# Patient Record
Sex: Male | Born: 1960 | Race: Black or African American | Hispanic: No | Marital: Single | Smoking: Former smoker
Health system: Southern US, Community
[De-identification: ages and names within clinical notes are randomized; demographics above are authoritative.]

## PROBLEM LIST (undated history)

## (undated) ENCOUNTER — Emergency Department (HOSPITAL_COMMUNITY): Discharge status: Absent without leave | Payer: Self-pay

## (undated) DIAGNOSIS — M1990 Unspecified osteoarthritis, unspecified site: Secondary | ICD-10-CM

## (undated) DIAGNOSIS — I10 Essential (primary) hypertension: Secondary | ICD-10-CM

## (undated) DIAGNOSIS — F1020 Alcohol dependence, uncomplicated: Secondary | ICD-10-CM

## (undated) DIAGNOSIS — F1010 Alcohol abuse, uncomplicated: Secondary | ICD-10-CM

## (undated) DIAGNOSIS — F419 Anxiety disorder, unspecified: Secondary | ICD-10-CM

## (undated) DIAGNOSIS — F329 Major depressive disorder, single episode, unspecified: Secondary | ICD-10-CM

## (undated) DIAGNOSIS — E119 Type 2 diabetes mellitus without complications: Secondary | ICD-10-CM

## (undated) DIAGNOSIS — G8929 Other chronic pain: Secondary | ICD-10-CM

## (undated) DIAGNOSIS — K859 Acute pancreatitis without necrosis or infection, unspecified: Secondary | ICD-10-CM

## (undated) DIAGNOSIS — IMO0001 Reserved for inherently not codable concepts without codable children: Secondary | ICD-10-CM

## (undated) DIAGNOSIS — R109 Unspecified abdominal pain: Secondary | ICD-10-CM

## (undated) DIAGNOSIS — E1142 Type 2 diabetes mellitus with diabetic polyneuropathy: Secondary | ICD-10-CM

## (undated) DIAGNOSIS — R51 Headache: Secondary | ICD-10-CM

## (undated) DIAGNOSIS — F32A Depression, unspecified: Secondary | ICD-10-CM

## (undated) DIAGNOSIS — E7800 Pure hypercholesterolemia, unspecified: Secondary | ICD-10-CM

## (undated) DIAGNOSIS — G709 Myoneural disorder, unspecified: Secondary | ICD-10-CM

## (undated) HISTORY — DX: Myoneural disorder, unspecified: G70.9

---
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
## 2005-07-29 ENCOUNTER — Ambulatory Visit: Payer: Self-pay | Admitting: Internal Medicine

## 2005-08-16 ENCOUNTER — Ambulatory Visit: Payer: Self-pay | Admitting: Internal Medicine

## 2005-08-30 ENCOUNTER — Ambulatory Visit: Payer: Self-pay | Admitting: Internal Medicine

## 2005-08-30 DIAGNOSIS — Z862 Personal history of diseases of the blood and blood-forming organs and certain disorders involving the immune mechanism: Secondary | ICD-10-CM

## 2005-08-30 DIAGNOSIS — Z8639 Personal history of other endocrine, nutritional and metabolic disease: Secondary | ICD-10-CM

## 2005-09-20 ENCOUNTER — Encounter (INDEPENDENT_AMBULATORY_CARE_PROVIDER_SITE_OTHER): Payer: Self-pay | Admitting: Internal Medicine

## 2006-08-14 DIAGNOSIS — E119 Type 2 diabetes mellitus without complications: Secondary | ICD-10-CM | POA: Insufficient documentation

## 2006-09-22 ENCOUNTER — Emergency Department (HOSPITAL_COMMUNITY): Admission: EM | Admit: 2006-09-22 | Discharge: 2006-09-23 | Payer: Self-pay | Admitting: Emergency Medicine

## 2006-09-27 ENCOUNTER — Encounter (INDEPENDENT_AMBULATORY_CARE_PROVIDER_SITE_OTHER): Payer: Self-pay | Admitting: Internal Medicine

## 2006-09-29 ENCOUNTER — Ambulatory Visit: Payer: Self-pay | Admitting: Internal Medicine

## 2006-09-29 LAB — CONVERTED CEMR LAB
ALT: 32 units/L (ref 0–53)
Albumin: 4.5 g/dL (ref 3.5–5.2)
Alkaline Phosphatase: 96 units/L (ref 39–117)
Amylase: 39 units/L (ref 0–105)
C-Peptide: 1.4 ng/mL (ref 0.80–3.90)
CO2: 23 meq/L (ref 19–32)
Calcium: 9.9 mg/dL (ref 8.4–10.5)
Chloride: 92 meq/L — ABNORMAL LOW (ref 96–112)
Cholesterol: 358 mg/dL — ABNORMAL HIGH (ref 0–200)
Creatinine, Ser: 1.05 mg/dL (ref 0.40–1.50)
HDL: 31 mg/dL — ABNORMAL LOW (ref >39)
Potassium: 4 meq/L (ref 3.5–5.3)
Total Bilirubin: 0.5 mg/dL (ref 0.3–1.2)
Total CHOL/HDL Ratio: 11.5
Triglycerides: 847 mg/dL — ABNORMAL HIGH (ref <150)

## 2006-09-30 ENCOUNTER — Encounter (INDEPENDENT_AMBULATORY_CARE_PROVIDER_SITE_OTHER): Payer: Self-pay | Admitting: Internal Medicine

## 2006-09-30 DIAGNOSIS — F1010 Alcohol abuse, uncomplicated: Secondary | ICD-10-CM | POA: Insufficient documentation

## 2006-09-30 DIAGNOSIS — R569 Unspecified convulsions: Secondary | ICD-10-CM | POA: Insufficient documentation

## 2006-09-30 DIAGNOSIS — I10 Essential (primary) hypertension: Secondary | ICD-10-CM

## 2006-09-30 DIAGNOSIS — E782 Mixed hyperlipidemia: Secondary | ICD-10-CM

## 2006-10-03 ENCOUNTER — Ambulatory Visit: Payer: Self-pay | Admitting: Internal Medicine

## 2006-10-03 LAB — CONVERTED CEMR LAB: Microalb, Ur: 0.24 mg/dL (ref 0.00–1.89)

## 2006-10-04 ENCOUNTER — Ambulatory Visit: Payer: Self-pay | Admitting: *Deleted

## 2006-10-17 ENCOUNTER — Ambulatory Visit: Payer: Self-pay | Admitting: Internal Medicine

## 2006-10-17 DIAGNOSIS — K5900 Constipation, unspecified: Secondary | ICD-10-CM | POA: Insufficient documentation

## 2006-10-20 ENCOUNTER — Ambulatory Visit (HOSPITAL_COMMUNITY): Admission: RE | Admit: 2006-10-20 | Discharge: 2006-10-20 | Payer: Self-pay | Admitting: Internal Medicine

## 2006-10-25 ENCOUNTER — Encounter (INDEPENDENT_AMBULATORY_CARE_PROVIDER_SITE_OTHER): Payer: Self-pay | Admitting: *Deleted

## 2006-10-31 ENCOUNTER — Ambulatory Visit: Payer: Self-pay | Admitting: Internal Medicine

## 2006-10-31 LAB — CONVERTED CEMR LAB: Blood Glucose, Fingerstick: 251

## 2006-12-05 ENCOUNTER — Telehealth (INDEPENDENT_AMBULATORY_CARE_PROVIDER_SITE_OTHER): Payer: Self-pay | Admitting: Internal Medicine

## 2006-12-12 ENCOUNTER — Ambulatory Visit: Payer: Self-pay | Admitting: Internal Medicine

## 2007-01-12 ENCOUNTER — Ambulatory Visit: Payer: Self-pay | Admitting: Internal Medicine

## 2007-01-12 DIAGNOSIS — B9789 Other viral agents as the cause of diseases classified elsewhere: Secondary | ICD-10-CM

## 2007-01-12 LAB — CONVERTED CEMR LAB: Blood Glucose, Fingerstick: 434

## 2007-01-16 ENCOUNTER — Ambulatory Visit: Payer: Self-pay | Admitting: Internal Medicine

## 2007-01-23 ENCOUNTER — Ambulatory Visit: Payer: Self-pay | Admitting: Internal Medicine

## 2007-01-26 ENCOUNTER — Ambulatory Visit: Payer: Self-pay | Admitting: Internal Medicine

## 2007-02-20 ENCOUNTER — Ambulatory Visit: Payer: Self-pay | Admitting: Internal Medicine

## 2007-02-20 LAB — CONVERTED CEMR LAB
Blood Glucose, Fingerstick: 600
Blood in Urine, dipstick: NEGATIVE
Protein, U semiquant: NEGATIVE
Urobilinogen, UA: NEGATIVE

## 2007-02-21 ENCOUNTER — Encounter (INDEPENDENT_AMBULATORY_CARE_PROVIDER_SITE_OTHER): Payer: Self-pay | Admitting: Internal Medicine

## 2007-02-23 LAB — CONVERTED CEMR LAB
ALT: 26 units/L (ref 0–53)
Albumin: 4.7 g/dL (ref 3.5–5.2)
Alkaline Phosphatase: 111 units/L (ref 39–117)
BUN: 10 mg/dL (ref 6–23)
Chloride: 96 meq/L (ref 96–112)
Creatinine, Ser: 1.08 mg/dL (ref 0.40–1.50)
Eosinophils Relative: 1 % (ref 0–5)
Glucose, Bld: 422 mg/dL — ABNORMAL HIGH (ref 70–99)
HCT: 44.5 % (ref 39.0–52.0)
Monocytes Relative: 7 % (ref 3–12)
Neutrophils Relative %: 53 % (ref 43–77)
Platelets: 236 10*3/uL (ref 150–400)
Potassium: 4.3 meq/L (ref 3.5–5.3)
RDW: 12.8 % (ref 11.5–15.5)
Sodium: 135 meq/L (ref 135–145)
Total Protein: 9.1 g/dL — ABNORMAL HIGH (ref 6.0–8.3)
WBC: 6.3 10*3/uL (ref 4.0–10.5)

## 2007-03-06 ENCOUNTER — Ambulatory Visit: Payer: Self-pay | Admitting: Internal Medicine

## 2007-03-12 ENCOUNTER — Telehealth (INDEPENDENT_AMBULATORY_CARE_PROVIDER_SITE_OTHER): Payer: Self-pay | Admitting: Internal Medicine

## 2007-03-13 ENCOUNTER — Ambulatory Visit: Payer: Self-pay | Admitting: Internal Medicine

## 2007-03-27 ENCOUNTER — Telehealth (INDEPENDENT_AMBULATORY_CARE_PROVIDER_SITE_OTHER): Payer: Self-pay | Admitting: Internal Medicine

## 2007-03-30 ENCOUNTER — Ambulatory Visit: Payer: Self-pay | Admitting: Internal Medicine

## 2007-04-24 ENCOUNTER — Telehealth (INDEPENDENT_AMBULATORY_CARE_PROVIDER_SITE_OTHER): Payer: Self-pay | Admitting: Internal Medicine

## 2007-04-26 ENCOUNTER — Telehealth (INDEPENDENT_AMBULATORY_CARE_PROVIDER_SITE_OTHER): Payer: Self-pay | Admitting: Internal Medicine

## 2007-06-01 ENCOUNTER — Ambulatory Visit: Payer: Self-pay | Admitting: Internal Medicine

## 2007-06-27 ENCOUNTER — Telehealth (INDEPENDENT_AMBULATORY_CARE_PROVIDER_SITE_OTHER): Payer: Self-pay | Admitting: Internal Medicine

## 2007-07-26 ENCOUNTER — Telehealth (INDEPENDENT_AMBULATORY_CARE_PROVIDER_SITE_OTHER): Payer: Self-pay | Admitting: Internal Medicine

## 2007-08-27 ENCOUNTER — Telehealth (INDEPENDENT_AMBULATORY_CARE_PROVIDER_SITE_OTHER): Payer: Self-pay | Admitting: Internal Medicine

## 2007-09-27 ENCOUNTER — Telehealth (INDEPENDENT_AMBULATORY_CARE_PROVIDER_SITE_OTHER): Payer: Self-pay | Admitting: Internal Medicine

## 2007-10-11 ENCOUNTER — Ambulatory Visit: Payer: Self-pay | Admitting: Internal Medicine

## 2007-10-11 DIAGNOSIS — G4700 Insomnia, unspecified: Secondary | ICD-10-CM

## 2007-10-29 ENCOUNTER — Telehealth (INDEPENDENT_AMBULATORY_CARE_PROVIDER_SITE_OTHER): Payer: Self-pay | Admitting: Internal Medicine

## 2007-11-01 ENCOUNTER — Telehealth (INDEPENDENT_AMBULATORY_CARE_PROVIDER_SITE_OTHER): Payer: Self-pay | Admitting: Internal Medicine

## 2007-11-21 ENCOUNTER — Telehealth (INDEPENDENT_AMBULATORY_CARE_PROVIDER_SITE_OTHER): Payer: Self-pay | Admitting: Internal Medicine

## 2007-11-28 ENCOUNTER — Telehealth (INDEPENDENT_AMBULATORY_CARE_PROVIDER_SITE_OTHER): Payer: Self-pay | Admitting: Internal Medicine

## 2007-12-26 ENCOUNTER — Telehealth (INDEPENDENT_AMBULATORY_CARE_PROVIDER_SITE_OTHER): Payer: Self-pay | Admitting: Internal Medicine

## 2008-01-24 ENCOUNTER — Telehealth (INDEPENDENT_AMBULATORY_CARE_PROVIDER_SITE_OTHER): Payer: Self-pay | Admitting: *Deleted

## 2008-01-25 ENCOUNTER — Telehealth (INDEPENDENT_AMBULATORY_CARE_PROVIDER_SITE_OTHER): Payer: Self-pay | Admitting: Internal Medicine

## 2008-02-25 ENCOUNTER — Telehealth (INDEPENDENT_AMBULATORY_CARE_PROVIDER_SITE_OTHER): Payer: Self-pay | Admitting: Internal Medicine

## 2008-03-26 ENCOUNTER — Telehealth (INDEPENDENT_AMBULATORY_CARE_PROVIDER_SITE_OTHER): Payer: Self-pay | Admitting: Internal Medicine

## 2008-04-11 ENCOUNTER — Ambulatory Visit: Payer: Self-pay | Admitting: Internal Medicine

## 2008-04-11 LAB — CONVERTED CEMR LAB: Blood Glucose, Fingerstick: 338

## 2008-04-23 ENCOUNTER — Telehealth (INDEPENDENT_AMBULATORY_CARE_PROVIDER_SITE_OTHER): Payer: Self-pay | Admitting: Internal Medicine

## 2008-05-19 ENCOUNTER — Telehealth (INDEPENDENT_AMBULATORY_CARE_PROVIDER_SITE_OTHER): Payer: Self-pay | Admitting: *Deleted

## 2008-05-27 ENCOUNTER — Ambulatory Visit: Payer: Self-pay | Admitting: Internal Medicine

## 2008-06-23 ENCOUNTER — Telehealth (INDEPENDENT_AMBULATORY_CARE_PROVIDER_SITE_OTHER): Payer: Self-pay | Admitting: Internal Medicine

## 2008-07-10 ENCOUNTER — Encounter (INDEPENDENT_AMBULATORY_CARE_PROVIDER_SITE_OTHER): Payer: Self-pay | Admitting: Internal Medicine

## 2008-07-23 ENCOUNTER — Telehealth (INDEPENDENT_AMBULATORY_CARE_PROVIDER_SITE_OTHER): Payer: Self-pay | Admitting: Internal Medicine

## 2008-08-21 ENCOUNTER — Telehealth (INDEPENDENT_AMBULATORY_CARE_PROVIDER_SITE_OTHER): Payer: Self-pay | Admitting: Internal Medicine

## 2008-09-09 ENCOUNTER — Ambulatory Visit: Payer: Self-pay | Admitting: Internal Medicine

## 2008-09-23 ENCOUNTER — Ambulatory Visit: Payer: Self-pay | Admitting: Internal Medicine

## 2008-09-23 DIAGNOSIS — N401 Enlarged prostate with lower urinary tract symptoms: Secondary | ICD-10-CM

## 2008-10-14 ENCOUNTER — Encounter (INDEPENDENT_AMBULATORY_CARE_PROVIDER_SITE_OTHER): Payer: Self-pay | Admitting: Internal Medicine

## 2008-10-22 ENCOUNTER — Telehealth (INDEPENDENT_AMBULATORY_CARE_PROVIDER_SITE_OTHER): Payer: Self-pay | Admitting: Internal Medicine

## 2008-11-24 ENCOUNTER — Telehealth (INDEPENDENT_AMBULATORY_CARE_PROVIDER_SITE_OTHER): Payer: Self-pay | Admitting: Internal Medicine

## 2008-12-26 ENCOUNTER — Telehealth (INDEPENDENT_AMBULATORY_CARE_PROVIDER_SITE_OTHER): Payer: Self-pay | Admitting: Internal Medicine

## 2009-01-07 ENCOUNTER — Inpatient Hospital Stay (HOSPITAL_COMMUNITY): Admission: EM | Admit: 2009-01-07 | Discharge: 2009-01-10 | Payer: Self-pay | Admitting: Emergency Medicine

## 2009-01-08 ENCOUNTER — Encounter (INDEPENDENT_AMBULATORY_CARE_PROVIDER_SITE_OTHER): Payer: Self-pay | Admitting: Internal Medicine

## 2009-01-27 ENCOUNTER — Ambulatory Visit: Payer: Self-pay | Admitting: Internal Medicine

## 2009-01-27 LAB — CONVERTED CEMR LAB
Blood Glucose, Fingerstick: 267
Hgb A1c MFr Bld: 14 %

## 2009-01-28 ENCOUNTER — Encounter (INDEPENDENT_AMBULATORY_CARE_PROVIDER_SITE_OTHER): Payer: Self-pay | Admitting: Internal Medicine

## 2009-01-28 LAB — CONVERTED CEMR LAB
BUN: 8 mg/dL (ref 6–23)
CO2: 27 meq/L (ref 19–32)
Calcium: 10.1 mg/dL (ref 8.4–10.5)
Chloride: 101 meq/L (ref 96–112)
Creatinine, Ser: 0.74 mg/dL (ref 0.40–1.50)
Glucose, Bld: 250 mg/dL — ABNORMAL HIGH (ref 70–99)
Potassium: 4.4 meq/L (ref 3.5–5.3)

## 2009-02-27 ENCOUNTER — Ambulatory Visit: Payer: Self-pay | Admitting: Internal Medicine

## 2009-02-27 LAB — CONVERTED CEMR LAB
Blood Glucose, Fingerstick: 497
Hgb A1c MFr Bld: >14 %

## 2009-03-02 ENCOUNTER — Emergency Department (HOSPITAL_COMMUNITY): Admission: EM | Admit: 2009-03-02 | Discharge: 2009-03-03 | Payer: Self-pay | Admitting: Emergency Medicine

## 2009-04-01 ENCOUNTER — Telehealth (INDEPENDENT_AMBULATORY_CARE_PROVIDER_SITE_OTHER): Payer: Self-pay | Admitting: Internal Medicine

## 2009-04-02 ENCOUNTER — Emergency Department (HOSPITAL_COMMUNITY): Admission: EM | Admit: 2009-04-02 | Discharge: 2009-04-02 | Payer: Self-pay | Admitting: Emergency Medicine

## 2009-04-06 ENCOUNTER — Ambulatory Visit: Payer: Self-pay | Admitting: Internal Medicine

## 2009-04-11 LAB — CONVERTED CEMR LAB: Phenytoin Lvl: <0.5 ug/mL — ABNORMAL LOW (ref 10.0–20.0)

## 2009-04-12 ENCOUNTER — Inpatient Hospital Stay (HOSPITAL_COMMUNITY): Admission: EM | Admit: 2009-04-12 | Discharge: 2009-04-14 | Payer: Self-pay | Admitting: Emergency Medicine

## 2009-04-15 ENCOUNTER — Emergency Department (HOSPITAL_COMMUNITY): Admission: EM | Admit: 2009-04-15 | Discharge: 2009-04-16 | Payer: Self-pay | Admitting: Emergency Medicine

## 2009-04-16 ENCOUNTER — Encounter (INDEPENDENT_AMBULATORY_CARE_PROVIDER_SITE_OTHER): Payer: Self-pay | Admitting: *Deleted

## 2009-04-28 ENCOUNTER — Emergency Department (HOSPITAL_COMMUNITY): Admission: EM | Admit: 2009-04-28 | Discharge: 2009-04-28 | Payer: Self-pay | Admitting: Emergency Medicine

## 2009-04-30 ENCOUNTER — Ambulatory Visit: Payer: Self-pay | Admitting: Internal Medicine

## 2009-04-30 DIAGNOSIS — F329 Major depressive disorder, single episode, unspecified: Secondary | ICD-10-CM

## 2009-04-30 LAB — CONVERTED CEMR LAB: Blood Glucose, Fingerstick: 300

## 2009-05-10 ENCOUNTER — Emergency Department (HOSPITAL_COMMUNITY): Admission: EM | Admit: 2009-05-10 | Discharge: 2009-05-10 | Payer: Self-pay | Admitting: Emergency Medicine

## 2009-05-12 ENCOUNTER — Ambulatory Visit: Payer: Self-pay | Admitting: Internal Medicine

## 2009-05-14 ENCOUNTER — Emergency Department (HOSPITAL_COMMUNITY): Admission: EM | Admit: 2009-05-14 | Discharge: 2009-05-14 | Payer: Self-pay | Admitting: Emergency Medicine

## 2009-05-27 ENCOUNTER — Telehealth: Payer: Self-pay | Admitting: Physician Assistant

## 2009-06-13 ENCOUNTER — Emergency Department (HOSPITAL_COMMUNITY): Admission: EM | Admit: 2009-06-13 | Discharge: 2009-06-14 | Payer: Self-pay | Admitting: Emergency Medicine

## 2009-06-16 ENCOUNTER — Emergency Department (HOSPITAL_COMMUNITY): Admission: EM | Admit: 2009-06-16 | Discharge: 2009-06-16 | Payer: Self-pay | Admitting: Emergency Medicine

## 2009-06-23 ENCOUNTER — Encounter (INDEPENDENT_AMBULATORY_CARE_PROVIDER_SITE_OTHER): Payer: Self-pay | Admitting: Internal Medicine

## 2009-06-23 ENCOUNTER — Inpatient Hospital Stay (HOSPITAL_COMMUNITY): Admission: EM | Admit: 2009-06-23 | Discharge: 2009-06-26 | Payer: Self-pay | Admitting: Emergency Medicine

## 2009-06-24 ENCOUNTER — Telehealth (INDEPENDENT_AMBULATORY_CARE_PROVIDER_SITE_OTHER): Payer: Self-pay | Admitting: Internal Medicine

## 2009-06-27 ENCOUNTER — Emergency Department (HOSPITAL_COMMUNITY): Admission: EM | Admit: 2009-06-27 | Discharge: 2009-06-27 | Payer: Self-pay | Admitting: Emergency Medicine

## 2009-07-01 ENCOUNTER — Emergency Department (HOSPITAL_COMMUNITY): Admission: EM | Admit: 2009-07-01 | Discharge: 2009-07-01 | Payer: Self-pay | Admitting: Emergency Medicine

## 2009-07-04 ENCOUNTER — Emergency Department (HOSPITAL_COMMUNITY): Admission: EM | Admit: 2009-07-04 | Discharge: 2009-07-04 | Payer: Self-pay | Admitting: Emergency Medicine

## 2009-07-11 ENCOUNTER — Emergency Department (HOSPITAL_COMMUNITY): Admission: EM | Admit: 2009-07-11 | Discharge: 2009-07-11 | Payer: Self-pay | Admitting: Emergency Medicine

## 2009-07-12 ENCOUNTER — Emergency Department (HOSPITAL_COMMUNITY): Admission: EM | Admit: 2009-07-12 | Discharge: 2009-07-12 | Payer: Self-pay | Admitting: Emergency Medicine

## 2009-07-27 ENCOUNTER — Telehealth (INDEPENDENT_AMBULATORY_CARE_PROVIDER_SITE_OTHER): Payer: Self-pay | Admitting: Internal Medicine

## 2009-07-30 ENCOUNTER — Emergency Department (HOSPITAL_COMMUNITY): Admission: EM | Admit: 2009-07-30 | Discharge: 2009-07-31 | Payer: Self-pay | Admitting: Emergency Medicine

## 2009-08-01 ENCOUNTER — Emergency Department (HOSPITAL_COMMUNITY): Admission: EM | Admit: 2009-08-01 | Discharge: 2009-08-02 | Payer: Self-pay | Admitting: Emergency Medicine

## 2009-08-01 ENCOUNTER — Emergency Department (HOSPITAL_COMMUNITY): Admission: EM | Admit: 2009-08-01 | Discharge: 2009-08-01 | Payer: Self-pay | Admitting: Emergency Medicine

## 2009-08-12 ENCOUNTER — Ambulatory Visit: Payer: Self-pay | Admitting: Internal Medicine

## 2009-08-12 DIAGNOSIS — K862 Cyst of pancreas: Secondary | ICD-10-CM | POA: Insufficient documentation

## 2009-08-12 DIAGNOSIS — K863 Pseudocyst of pancreas: Secondary | ICD-10-CM

## 2009-08-12 LAB — CONVERTED CEMR LAB: Blood Glucose, Fingerstick: 253

## 2009-08-13 ENCOUNTER — Emergency Department (HOSPITAL_COMMUNITY): Admission: EM | Admit: 2009-08-13 | Discharge: 2009-08-13 | Payer: Self-pay | Admitting: Emergency Medicine

## 2009-08-15 ENCOUNTER — Emergency Department (HOSPITAL_COMMUNITY): Admission: EM | Admit: 2009-08-15 | Discharge: 2009-08-15 | Payer: Self-pay | Admitting: Emergency Medicine

## 2009-08-21 ENCOUNTER — Telehealth (INDEPENDENT_AMBULATORY_CARE_PROVIDER_SITE_OTHER): Payer: Self-pay | Admitting: Internal Medicine

## 2009-08-22 ENCOUNTER — Emergency Department (HOSPITAL_COMMUNITY): Admission: EM | Admit: 2009-08-22 | Discharge: 2009-08-22 | Payer: Self-pay | Admitting: Emergency Medicine

## 2009-09-02 ENCOUNTER — Telehealth (INDEPENDENT_AMBULATORY_CARE_PROVIDER_SITE_OTHER): Payer: Self-pay | Admitting: Internal Medicine

## 2009-09-03 ENCOUNTER — Ambulatory Visit: Payer: Self-pay | Admitting: Internal Medicine

## 2009-09-07 DEATH — deceased

## 2009-09-17 ENCOUNTER — Ambulatory Visit: Payer: Self-pay | Admitting: Internal Medicine

## 2009-09-23 ENCOUNTER — Telehealth (INDEPENDENT_AMBULATORY_CARE_PROVIDER_SITE_OTHER): Payer: Self-pay | Admitting: Internal Medicine

## 2009-10-04 ENCOUNTER — Emergency Department (HOSPITAL_COMMUNITY): Admission: EM | Admit: 2009-10-04 | Discharge: 2009-10-04 | Payer: Self-pay | Admitting: Emergency Medicine

## 2009-10-08 ENCOUNTER — Inpatient Hospital Stay (HOSPITAL_COMMUNITY): Admission: EM | Admit: 2009-10-08 | Discharge: 2009-10-15 | Payer: Self-pay | Admitting: Emergency Medicine

## 2009-10-27 ENCOUNTER — Telehealth (INDEPENDENT_AMBULATORY_CARE_PROVIDER_SITE_OTHER): Payer: Self-pay | Admitting: Internal Medicine

## 2009-11-24 ENCOUNTER — Telehealth (INDEPENDENT_AMBULATORY_CARE_PROVIDER_SITE_OTHER): Payer: Self-pay | Admitting: Internal Medicine

## 2009-12-29 ENCOUNTER — Ambulatory Visit: Payer: Self-pay | Admitting: Internal Medicine

## 2009-12-29 DIAGNOSIS — Z9119 Patient's noncompliance with other medical treatment and regimen: Secondary | ICD-10-CM

## 2009-12-29 DIAGNOSIS — M5412 Radiculopathy, cervical region: Secondary | ICD-10-CM | POA: Insufficient documentation

## 2010-01-25 ENCOUNTER — Telehealth (INDEPENDENT_AMBULATORY_CARE_PROVIDER_SITE_OTHER): Payer: Self-pay | Admitting: Internal Medicine

## 2010-02-24 ENCOUNTER — Telehealth (INDEPENDENT_AMBULATORY_CARE_PROVIDER_SITE_OTHER): Payer: Self-pay | Admitting: Internal Medicine

## 2010-03-09 NOTE — Progress Notes (Signed)
Summary: REFILLS REQUEST   Phone Note Call from Patient Call back at [phone]   Summary of Call: PT IS REQUESTING FOR MORE REFILLS FROM PERCOCET MEDICATION. Rotha Cassels MD Initial call taken by: Manon Hilding,  Jun 24, 2009 8:37 AM  Follow-up for Phone Call        Sent to Dr. Delrae Alfred for f/u. Follow-up by: Dutch Quint RN,  Jun 24, 2009 9:15 AM  Additional Follow-up for Phone Call Additional follow up Details #1::        can pick up Additional Follow-up by: Julieanne Manson MD,  Jun 29, 2009 2:06 PM    Prescriptions: PERCOCET 5-325 MG  TABS (OXYCODONE-ACETAMINOPHEN) 2 tabs by mouth q 6h as needed pain  #60 x 0   Entered and Authorized by:   Julieanne Manson MD   Signed by:   Julieanne Manson MD on 06/29/2009   Method used:   Print then Give to Patient   RxID:   [id number] PERCOCET 5-325 MG  TABS (OXYCODONE-ACETAMINOPHEN) 2 tabs by mouth q 6h as needed pain  #60 x 0   Entered and Authorized by:   Julieanne Manson MD   Signed by:   Julieanne Manson MD on 06/29/2009   Method used:   Print then Give to Patient   RxID:   [id number]  had to set up for different printer

## 2010-03-09 NOTE — Progress Notes (Signed)
Summary: Percocet refills      Phone Note Call from Patient Call back at [phone]   Summary of Call: The pt needs more refills from his percocet medication.  Please call him back when is ready. Gloriann Riede MD Initial call taken by: Manon Hilding,  July 27, 2009 10:09 AM  Follow-up for Phone Call        Last got #60 on 06/29/09 Follow-up by: Vesta Mixer CMA,  July 27, 2009 10:36 AM  Additional Follow-up for Phone Call Additional follow up Details #1::        Has he had an appt. with Aquilla Solian? When is his follow up with me? Additional Follow-up by: Julieanne Manson MD,  July 27, 2009 6:12 PM    Additional Follow-up for Phone Call Additional follow up Details #2::    He did have an appt with Marchelle Folks on April 5th.  He does not have a f/u scheduled with you. Follow-up by: Vesta Mixer CMA,  July 28, 2009 9:33 AM  Additional Follow-up for Phone Call Additional follow up Details #3:: Details for Additional Follow-up Action Taken: He needs to follow up with me as recommended to continue receiving pain meds--should have been seen end of May.  Needs OV next available to continue after this.  Pt will come for an office viist on July 6th at 10:45 am.Graciela Kellar  July 30, 2009 10:09 AM Additional Follow-up by: Julieanne Manson MD,  July 29, 2009 6:11 PM  Prescriptions: PERCOCET 5-325 MG  TABS (OXYCODONE-ACETAMINOPHEN) 2 tabs by mouth q 6h as needed pain  #60 x 0   Entered and Authorized by:   Julieanne Manson MD   Signed by:   Julieanne Manson MD on 07/29/2009   Method used:   Print then Give to Patient   RxID:   [id number]

## 2010-03-09 NOTE — Progress Notes (Signed)
Summary: CALLIGN AHEAD FOR PAIN MED   Phone Note Call from Patient Call back at Home Phone [phone]   Reason for Call: Refill Medication Summary of Call: Gabriel Rush PT. MR [name] IS CALLING AHEAD FOR HIS PAIN MEDICATION (PERCOCET) TO BE PICKED UP NEXT WEEK Initial call taken by: Leodis Rains,  August 21, 2009 4:18 PM  Follow-up for Phone Call        Sent to E. Claryce Friel.  Dutch Quint RN  23-Sep-2009 3:46 PM  Med due on 08/28/2009 Dr. Delrae Alfred to fill on that date n.[email or web address],fnp  2009-09-23 6:12 PM     Prescriptions: PERCOCET 5-325 MG  TABS (OXYCODONE-ACETAMINOPHEN) 2 tabs by mouth q 6h as needed pain  #60 x 0   Entered and Authorized by:   Julieanne Manson MD   Signed by:   Julieanne Manson MD on 08/28/2009   Method used:   Print then Give to Patient   RxID:   [id number]

## 2010-03-09 NOTE — Progress Notes (Signed)
Summary: Percocet Refills   Phone Note Call from Patient Call back at [phone]   Summary of Call: The just called because he needs to get more refills from his percocet medicatin. Mulberry MD Initial call taken by: Manon Hilding,  September 23, 2009 3:05 PM  Follow-up for Phone Call        Last got #60 on 08/28/09. Follow-up by: Vesta Mixer CMA,  September 23, 2009 3:37 PM  Additional Follow-up for Phone Call Additional follow up Details #1::        May pick up Friday Additional Follow-up by: Julieanne Manson MD,  September 23, 2009 10:04 PM    Additional Follow-up for Phone Call Additional follow up Details #2::    pt aware he may pick up rx on tomorrow. Follow-up by: Vesta Mixer CMA,  September 24, 2009 9:56 AM  Prescriptions: PERCOCET 5-325 MG  TABS (OXYCODONE-ACETAMINOPHEN) 2 tabs by mouth q 6h as needed pain  #60 x 0   Entered and Authorized by:   Julieanne Manson MD   Signed by:   Julieanne Manson MD on 09/23/2009   Method used:   Print then Give to Patient   RxID:   [id number]

## 2010-03-09 NOTE — Letter (Signed)
Summary: AMANDA'S SUMMARY  AMANDA'S SUMMARY   Imported By: Arta Bruce 07/02/2009 12:30:59  _____________________________________________________________________  External Attachment:    Type:   Image     Comment:   External Document

## 2010-03-09 NOTE — Assessment & Plan Note (Signed)
Summary: 1 WEEK FU/PER Zylee Marchiano///[place]   Vital Signs:  Patient profile:   50 year old male Weight:      211.4 pounds Temp:     98.0 degrees F oral Pulse rate:   116 / minute Pulse rhythm:   regular BP sitting:   144 / 82  (left arm) Cuff size:   large  Vitals Entered ByMadie Reno Davis(February 27, 2009 11:11 AM) CC: pt here for followup, needs prescription for oxycodone, pt states the abcess has gone down some. Pt states teh pancreaitis is till causing him alot of pain all over his body particularly in his abdomen and back. Is Patient Diabetic? Yes Did you bring your meter with you today? No Pain Assessment Patient in pain? yes     Location: abdomen Intensity: 20 Type: heaviness CBG Result 497  Does patient need assistance? Functional Status Self care Ambulation Normal   CC:  pt here for followup, needs prescription for oxycodone, and pt states the abcess has gone down some. Pt states teh pancreaitis is till causing him alot of pain all over his body particularly in his abdomen and back..  History of Present Illness: 1.  Left facial cellulitis:  much better.  Discomfort significantly less.  Finished PCN .  Still with some discomfort in left nostril.  2.  Pancreatitis:  Worse here recently.  Not able to keep anything other than chicken broth down in past 3 days.    Feels warm at times, but no definite fever.  Does not feel like he needs to be in hospital.  Urinating okay. Out of pain meds for 4 days.  Has not signed a pain contract yet.  3.  DM:  Sugars high past few days with increased pain.  Prior, was running in 300-400 range. Pt. has known problems with compliance.  States he is using Novolog qid and Lantus at night.  Allergies (verified): No Known Drug Allergies  Physical Exam  General:  Appears to be in discomfort, holdin abdomen Mouth:  MMM Lungs:  Normal respiratory effort, chest expands symmetrically. Lungs are clear to auscultation, no crackles or  wheezes. Heart:  Normal rate and regular rhythm. S1 and S2 normal without gallop, murmur, click, rub or other extra sounds. Abdomen:  Bowel sounds positive,abdomen soft -no increase in abdominal pain with palpation, wiithout masses, organomegaly or hernias noted.   Impression & Recommendations:  Problem # 1:  PANCREATITIS, ALCOHOLIC (ICD-577.0) Pain meds written. Pain contract to be signed today  Problem # 2:  DIABETES MELLITUS, TYPE II, ON INSULIN (ICD-250.00)  Pt. has never been controlled. Significant problems with compliance Pain also adding to elevation of sugars most likely Does not appear dehydrated His updated medication list for this problem includes:    Lisinopril 10 Mg Tabs (Lisinopril) .Marland Kitchen... 1 tab by mouth daily    Glucagon Emergency 1 Mg Kit (Glucagon (rdna)) ..... Use im/sq as needed hypoglycemic episode    Gluco Burst 40 % Gel (Dextrose (diabetic use)) .Marland KitchenMarland KitchenMarland KitchenMarland Kitchen 15g by mouth as needed low blood glucose    Novolog Flexpen 100 Unit/ml Soln (Insulin aspart) .Marland KitchenMarland KitchenMarland KitchenMarland Kitchen 18-24  units subcutaneously before meals    Lantus Solostar 100 Unit/ml Soln (Insulin glargine) .Marland KitchenMarland KitchenMarland KitchenMarland Kitchen 40 units subcutaneously nightly  Orders: Hgb A1C (84132GM)  Complete Medication List: 1)  Lisinopril 10 Mg Tabs (Lisinopril) .Marland Kitchen.. 1 tab by mouth daily 2)  Dilantin 100 Mg Caps (Phenytoin sodium extended) .... 2 caps by mouth q hs 3)  Glucagon Emergency 1 Mg Kit (Glucagon (  rdna)) .... Use im/sq as needed hypoglycemic episode 4)  Gluco Burst 40 % Gel (Dextrose (diabetic use)) .Marland Kitchen.. 15g by mouth as needed low blood glucose 5)  Novolog Flexpen 100 Unit/ml Soln (Insulin aspart) .Marland KitchenMarland KitchenMarland Kitchen 18-24  units subcutaneously before meals 6)  Lantus Solostar 100 Unit/ml Soln (Insulin glargine) .... 40 units subcutaneously nightly 7)  Protonix 40 Mg Pack (Pantoprazole sodium) .Marland Kitchen.. 1 cap by mouth daily 8)  Percocet 5-325 Mg Tabs (Oxycodone-acetaminophen) .... 2 tabs by mouth q 6h as needed pain 9)  Hydrochlorothiazide 25 Mg Tabs  (Hydrochlorothiazide) .Marland Kitchen.. 1 tab by mouth in morning 10)  Glucometer Test Strp (glucose Blood)  .... Test three times a day 11)  Creon 24000 Unit Cpep (Pancrelipase (lip-prot-amyl)) .Marland Kitchen.. 1 cap by mouth three times a day with meals 12)  Flomax 0.4 Mg Caps (Tamsulosin hcl) .Marland Kitchen.. 1 cap by mouth with evening meal 13)  Gemfibrozil 600 Mg Tabs (Gemfibrozil) .Marland Kitchen.. 1 tab by mouth two times a day with meals 14)  Penicillin V Potassium 500 Mg Tabs (Penicillin v potassium) .Marland Kitchen.. 1 tab by mouth 4 times daily  Patient Instructions: 1)  Use 45 units of Lantus tonight. 2)  Follow up with Dr. Delrae Alfred in 2 weeks--DM and pancreatitis Prescriptions: PERCOCET 5-325 MG  TABS (OXYCODONE-ACETAMINOPHEN) 2 tabs by mouth q 6h as needed pain  #60 x 0   Entered and Authorized by:   Julieanne Manson MD   Signed by:   Julieanne Manson MD on 02/27/2009   Method used:   Print then Give to Patient   RxID:   [id number]    Vital Signs:  Patient profile:   50 year old male Weight:      211.4 pounds Temp:     98.0 degrees F oral Pulse rate:   116 / minute Pulse rhythm:   regular BP sitting:   144 / 82  (left arm) Cuff size:   large  Vitals Entered ByMadie Reno Davis(February 27, 2009 11:11 AM)    Laboratory Results   Blood Tests   Date/Time Received: February 27, 2009 4:26 PM   HGBA1C: >14%   (Normal Range: Non-Diabetic - 3-6%   Control Diabetic - 6-8%) CBG Random:: 497mg /dL

## 2010-03-09 NOTE — Assessment & Plan Note (Signed)
Summary: FU OFFICE VISIT WITH DR Giannamarie Paulus//[facility]   Vital Signs:  Patient profile:   50 year old male Weight:      217 pounds Temp:     97.5 degrees F Pulse rate:   70 / minute Pulse rhythm:   regular Resp:     18 per minute BP sitting:   137 / 93  (left arm) Cuff size:   regular  Vitals Entered By: Vesta Mixer CMA (August 12, 2009 11:38 AM) CC: Wants referral to surgeon for a cyst on his pancreas Is Patient Diabetic? Yes Pain Assessment Patient in pain? yes     Location: abdomen Intensity: 5 CBG Result 253  Does patient need assistance? Ambulation Normal   CC:  Wants referral to surgeon for a cyst on his pancreas.  History of Present Illness: 1.  Alcoholism:  pt. seen in ED 3 times last month with alcohol intoxication and epigastric pain brought on by his use of alcohol .  Did go to see Aquilla Solian once--states he cannot afford to go back to her.  Pt. states he has been binge drinking twice monthly--friends bring him alcohol.  Drinks until he passes out or is hurting.    2.  DM:  sugars running in 200-300 range.  Has helped to make sure he gives Novolog before a meal, even if just had some Lantus.  Often missing insulin with alcohol use.  3.  Reported Pancreatic Pseudocyst:  Pt. actually admitted to hospital on 07/27/09 for epigastric and chest pain/alcohol intoxication.  Serial enzymes and EKGs negative for cardiac injury.  Noted to have a 5.8 cm pancreatic pseudocyst.  Dr. Daphine Deutscher of surgery consulted and recommended to follow/conservative management.  Allergies (verified): No Known Drug Allergies  Physical Exam  General:  Baseline--appears to be in some discomfort. Lungs:  Normal respiratory effort, chest expands symmetrically. Lungs are clear to auscultation, no crackles or wheezes. Heart:  Normal rate and regular rhythm. S1 and S2 normal without gallop, murmur, click, rub or other extra sounds.  Radial pulses normal and equal Abdomen:  Tender in epigastrium as his  baseline.  No peritoneal signs.normal bowel sounds.     Impression & Recommendations:  Problem # 1:  PSEUDOCYST, PANCREAS (ICD-577.2)  Orders: Surgical Referral (Surgery)  Dr. Luretha Murphy for follow up  Problem # 2:  DIABETES MELLITUS, TYPE II, ON INSULIN (ICD-250.00) Encouraged pt. to get help for alcoholism as the rest of his health concerns will not stabilize otherwise. Pt. states he cannot afford to really even come see me, let alone see Aquilla Solian. Will see if Marchelle Folks can work with him over the phone to get treatment. States he is not paying for his alcohol.  His updated medication list for this problem includes:    Lisinopril 10 Mg Tabs (Lisinopril) .Marland Kitchen... 1 tab by mouth daily    Glucagon Emergency 1 Mg Kit (Glucagon (rdna)) ..... Use im/sq as needed hypoglycemic episode    Gluco Burst 40 % Gel (Dextrose (diabetic use)) .Marland KitchenMarland KitchenMarland KitchenMarland Kitchen 15g by mouth as needed low blood glucose    Novolog Flexpen 100 Unit/ml Soln (Insulin aspart) .Marland KitchenMarland KitchenMarland KitchenMarland Kitchen 18-24  units subcutaneously before meals    Lantus Solostar 100 Unit/ml Soln (Insulin glargine) .Marland KitchenMarland KitchenMarland KitchenMarland Kitchen 40 units subcutaneously nightly  Orders: Capillary Blood Glucose/CBG (16109)  Problem # 3:  ALCOHOL ABUSE (ICD-305.00) As in problem #2 Until this is addressed, pt. will not be able to improve rest of health concerns.  Complete Medication List: 1)  Lisinopril 10 Mg Tabs (Lisinopril) .Marland KitchenMarland KitchenMarland Kitchen  1 tab by mouth daily 2)  Dilantin 100 Mg Caps (Phenytoin sodium extended) .... 2 caps by mouth q hs 3)  Glucagon Emergency 1 Mg Kit (Glucagon (rdna)) .... Use im/sq as needed hypoglycemic episode 4)  Gluco Burst 40 % Gel (Dextrose (diabetic use)) .Marland Kitchen.. 15g by mouth as needed low blood glucose 5)  Novolog Flexpen 100 Unit/ml Soln (Insulin aspart) .Marland KitchenMarland KitchenMarland Kitchen 18-24  units subcutaneously before meals 6)  Lantus Solostar 100 Unit/ml Soln (Insulin glargine) .... 40 units subcutaneously nightly 7)  Protonix 40 Mg Pack (Pantoprazole sodium) .Marland Kitchen.. 1 cap by mouth daily 8)  Percocet  5-325 Mg Tabs (Oxycodone-acetaminophen) .... 2 tabs by mouth q 6h as needed pain 9)  Hydrochlorothiazide 25 Mg Tabs (Hydrochlorothiazide) .Marland Kitchen.. 1 tab by mouth in morning 10)  Glucometer Test Strp (glucose Blood)  .... Test three times a day 11)  Creon 24000 Unit Cpep (Pancrelipase (lip-prot-amyl)) .Marland Kitchen.. 1 cap by mouth three times a day with meals 12)  Flomax 0.4 Mg Caps (Tamsulosin hcl) .Marland Kitchen.. 1 cap by mouth with evening meal 13)  Gemfibrozil 600 Mg Tabs (Gemfibrozil) .Marland Kitchen.. 1 tab by mouth two times a day with meals 14)  Penicillin V Potassium 500 Mg Tabs (Penicillin v potassium) .Marland Kitchen.. 1 tab by mouth 4 times daily  Patient Instructions: 1)  Follow up with Dr. Delrae Alfred in 4 months --DM 2)  Reappoint with Aquilla Solian

## 2010-03-09 NOTE — Progress Notes (Signed)
Summary: TIME FOR PERCOCET  Phone Note Call from Patient Call back at Home Phone [phone]   Reason for Call: Refill Medication Summary of Call: MULBERRY PT. CALLING FOR HIS PERCOCET. Initial call taken by: Leodis Rains,  November 24, 2009 2:13 PM  Follow-up for Phone Call        Sent to E. Mulberry.  Dutch Quint RN  November 24, 2009 3:32 PM  Not due until 11/28/2009 Tereso Newcomer PA-C  November 25, 2009 11:52 AM   Sent to E. Mulberry.  Dutch Quint RN  November 26, 2009 3:52 PM     Prescriptions: PERCOCET 5-325 MG  TABS (OXYCODONE-ACETAMINOPHEN) 2 tabs by mouth q 6h as needed pain  #60 x 0   Entered and Authorized by:   Julieanne Manson MD   Signed by:   Julieanne Manson MD on 11/26/2009   Method used:   Print then Give to Patient   RxID:   [id number]

## 2010-03-09 NOTE — Letter (Signed)
Summary: *HSN Results Follow up  [facility]  [address] [place], [place] [address]   Phone: [phone]  Fax: [phone]      04/16/2009   [facility] [address] [place], [place]  [address]   Dear  Mr. Gabriel Rush,                            ____S.Drinkard,FNP   ____D. Gore,FNP       ____B. McPherson,MD   ____V. Rankins,MD    __xx__E. Mulberry,MD    ____N. Daphine Deutscher, FNP  ____D. Reche Dixon, MD    ____K. Philipp Deputy, MD    ____Other     This letter is to inform you that your recent test(s):  _______Pap Smear    ___xx____Lab Test     _______X-ray    _______ is within acceptable limits  _______ requires a medication change  _______ requires a follow-up lab visit  _______ requires a follow-up visit with your provider   Comments:  Please give [place] a call regarding your recent lab test.  Thank you.       _________________________________________________________ If you have any questions, please contact our office                     Sincerely,  Tiffany McCoy CMA [facility]

## 2010-03-09 NOTE — Assessment & Plan Note (Signed)
Summary: f/u dm /tmm   Vital Signs:  Patient profile:   50 year old male Weight:      214.38 pounds Temp:     98.1 degrees F Pulse rate:   86 / minute Pulse rhythm:   regular Resp:     16 per minute BP sitting:   154 / 104  (left arm) Cuff size:   regular  Vitals Entered By: Chauncy Passy, SMA  CC: Pt. is here for a f/u from 1/11 for his DM and pancreatis. Pt. is in pain and any physical activity will make it worse. Only the meds will ease the pain.  Is Patient Diabetic? Yes Did you bring your meter with you today? No Pain Assessment Patient in pain? yes     Location: abdomen Intensity: 7 Type: Throbbing Onset of pain  Constant CBG Result 300  Does patient need assistance? Ambulation Normal   CC:  Pt. is here for a f/u from 1/11 for his DM and pancreatis. Pt. is in pain and any physical activity will make it worse. Only the meds will ease the pain. Marland Kitchen  History of Present Illness: 1.  Hospitalized beginning of month with hx of drinking and developing abdominal pain.  Lipase on admission was not elevated.  Sugar was elevated as well.  Pt. treated with IV hydration and by mouth pain medication. Has not used alcohol since last hospitalization.  2.  Seizure disorder:  Pt. was not taking his Dilantin previously when level checked.  Thinks he may have had a seizure on day he was hospitalized--sat down on a bench and then no memory until on cot in ED.  Possibility of seizure not mentioned in hospital notes that I can see. Pt. had been drinking that day--so not withdrawal seizure if indeed he had one.  3.  DM:  states eating mainly carbs--has difficulty buying other foods.  Admits he is tired of taking meds and stops taking--usually takes the Novolog in the morning, but then misses the later 2 doses of Novolog and Lantus.  Had a bagle this morning.  4. Possible Depression:  States he goes to bed around midnight and up at 9 - 9:30 a.m.  Does look forward to day.  Does not   Current  Medications (verified): 1)  Lisinopril 10 Mg  Tabs (Lisinopril) .Marland Kitchen.. 1 Tab By Mouth Daily 2)  Dilantin 100 Mg  Caps (Phenytoin Sodium Extended) .... 2 Caps By Mouth Q Hs 3)  Glucagon Emergency 1 Mg  Kit (Glucagon (Rdna)) .... Use Im/sq As Needed Hypoglycemic Episode 4)  Gluco Burst 40 %  Gel (Dextrose (Diabetic Use)) .Marland Kitchen.. 15g By Mouth As Needed Low Blood Glucose 5)  Novolog Flexpen 100 Unit/ml  Soln (Insulin Aspart) .Marland KitchenMarland KitchenMarland Kitchen 18-24  Units Subcutaneously Before Meals 6)  Lantus Solostar 100 Unit/ml  Soln (Insulin Glargine) .... 40 Units Subcutaneously Nightly 7)  Protonix 40 Mg  Pack (Pantoprazole Sodium) .Marland Kitchen.. 1 Cap By Mouth Daily 8)  Percocet 5-325 Mg  Tabs (Oxycodone-Acetaminophen) .... 2 Tabs By Mouth Q 6h As Needed Pain 9)  Hydrochlorothiazide 25 Mg  Tabs (Hydrochlorothiazide) .Marland Kitchen.. 1 Tab By Mouth in Morning 10)  Glucometer Test   Strp (Glucose Blood) .... Test Three Times A Day 11)  Creon 24000 Unit Cpep (Pancrelipase (Lip-Prot-Amyl)) .Marland Kitchen.. 1 Cap By Mouth Three Times A Day With Meals 12)  Flomax 0.4 Mg Caps (Tamsulosin Hcl) .Marland Kitchen.. 1 Cap By Mouth With Evening Meal 13)  Gemfibrozil 600 Mg Tabs (Gemfibrozil) .Marland Kitchen.. 1 Tab  By Mouth Two Times A Day With Meals 14)  Penicillin V Potassium 500 Mg Tabs (Penicillin V Potassium) .Marland Kitchen.. 1 Tab By Mouth 4 Times Daily  Allergies (verified): No Known Drug Allergies  Physical Exam  General:  Obvious abdominal discomfort Lungs:  Normal respiratory effort, chest expands symmetrically. Lungs are clear to auscultation, no crackles or wheezes. Heart:  Normal rate and regular rhythm. S1 and S2 normal without gallop, murmur, click, rub or other extra sounds. Abdomen:  soft, normal bowel sounds, no hepatomegaly, and no splenomegaly appreciated.  Diffuse moderate abdominal tenderness--worse in epigastrium.     Impression & Recommendations:  Problem # 1:  HYPERTENSION, BENIGN ESSENTIAL (ICD-401.1) Not controlled--not taking meds regularly His updated medication list for  this problem includes:    Lisinopril 10 Mg Tabs (Lisinopril) .Marland Kitchen... 1 tab by mouth daily    Hydrochlorothiazide 25 Mg Tabs (Hydrochlorothiazide) .Marland Kitchen... 1 tab by mouth in morning  Problem # 2:  ALCOHOL ABUSE (ICD-305.00)  and depression  Orders: Psychology Referral (Psychology)  Problem # 3:  SEIZURE DISORDER (ICD-780.39) Pt. states he is now taking Dilantin--not clear when his last seizure was. Check level at next visit His updated medication list for this problem includes:    Dilantin 100 Mg Caps (Phenytoin sodium extended) .Marland Kitchen... 2 caps by mouth q hs  Problem # 4:  PANCREATITIS, ALCOHOLIC (ICD-577.0) To work on control of alcohol and other health issues Orders: Psychology Referral (Psychology)  Problem # 5:  DIABETES MELLITUS, TYPE II, ON INSULIN (ICD-250.00) To take Lantus in morning with first dose of Novolog--separate injections. Hopefully, will see somewhat better control. Discussed still really needs to take insulin before every meal--but this is a start today--finally being honest about what he had been doing. Discussed inexpensive ways to eat a bit more healthy His updated medication list for this problem includes:    Lisinopril 10 Mg Tabs (Lisinopril) .Marland Kitchen... 1 tab by mouth daily    Glucagon Emergency 1 Mg Kit (Glucagon (rdna)) ..... Use im/sq as needed hypoglycemic episode    Gluco Burst 40 % Gel (Dextrose (diabetic use)) .Marland KitchenMarland KitchenMarland KitchenMarland Kitchen 15g by mouth as needed low blood glucose    Novolog Flexpen 100 Unit/ml Soln (Insulin aspart) .Marland KitchenMarland KitchenMarland KitchenMarland Kitchen 18-24  units subcutaneously before meals    Lantus Solostar 100 Unit/ml Soln (Insulin glargine) .Marland KitchenMarland KitchenMarland KitchenMarland Kitchen 40 units subcutaneously nightly  Orders: Capillary Blood Glucose/CBG (96295)  Complete Medication List: 1)  Lisinopril 10 Mg Tabs (Lisinopril) .Marland Kitchen.. 1 tab by mouth daily 2)  Dilantin 100 Mg Caps (Phenytoin sodium extended) .... 2 caps by mouth q hs 3)  Glucagon Emergency 1 Mg Kit (Glucagon (rdna)) .... Use im/sq as needed hypoglycemic episode 4)   Gluco Burst 40 % Gel (Dextrose (diabetic use)) .Marland Kitchen.. 15g by mouth as needed low blood glucose 5)  Novolog Flexpen 100 Unit/ml Soln (Insulin aspart) .Marland KitchenMarland KitchenMarland Kitchen 18-24  units subcutaneously before meals 6)  Lantus Solostar 100 Unit/ml Soln (Insulin glargine) .... 40 units subcutaneously nightly 7)  Protonix 40 Mg Pack (Pantoprazole sodium) .Marland Kitchen.. 1 cap by mouth daily 8)  Percocet 5-325 Mg Tabs (Oxycodone-acetaminophen) .... 2 tabs by mouth q 6h as needed pain 9)  Hydrochlorothiazide 25 Mg Tabs (Hydrochlorothiazide) .Marland Kitchen.. 1 tab by mouth in morning 10)  Glucometer Test Strp (glucose Blood)  .... Test three times a day 11)  Creon 24000 Unit Cpep (Pancrelipase (lip-prot-amyl)) .Marland Kitchen.. 1 cap by mouth three times a day with meals 12)  Flomax 0.4 Mg Caps (Tamsulosin hcl) .Marland Kitchen.. 1 cap by mouth with evening meal 13)  Gemfibrozil 600 Mg Tabs (Gemfibrozil) .Marland Kitchen.. 1 tab by mouth two times a day with meals 14)  Penicillin V Potassium 500 Mg Tabs (Penicillin v potassium) .Marland Kitchen.. 1 tab by mouth 4 times daily  Patient Instructions: 1)  Follow up with Dr. Delrae Alfred in 2 months --DM, depression, hypertension 2)  Referral to [facility] Prescriptions: GEMFIBROZIL 600 MG TABS (GEMFIBROZIL) 1 tab by mouth two times a day with meals  #60 x 6   Entered and Authorized by:   Julieanne Manson MD   Signed by:   Julieanne Manson MD on 04/30/2009   Method used:   Faxed to ...       [facility] (retail)       [address] [place], [place]  [address]       Ph: [phone]       Fax: [phone]   RxID:   [id number] FLOMAX 0.4 MG CAPS (TAMSULOSIN HCL) 1 cap by mouth with evening meal  #30 x 11   Entered and Authorized by:   Julieanne Manson MD   Signed by:   Julieanne Manson MD on 04/30/2009   Method used:   Faxed to ...       [facility] (retail)       [address] [place], [place]  [address]       Ph: [phone]       Fax:  [phone]   RxID:   [id number] CREON 24000 UNIT CPEP (PANCRELIPASE (LIP-PROT-AMYL)) 1 cap by mouth three times a day with meals  #90 x 11   Entered and Authorized by:   Julieanne Manson MD   Signed by:   Julieanne Manson MD on 04/30/2009   Method used:   Faxed to ...       [facility] (retail)       [address] [place], [place]  [address]       Ph: [phone]       Fax: [phone]   RxID:   [id number] HYDROCHLOROTHIAZIDE 25 MG  TABS (HYDROCHLOROTHIAZIDE) 1 tab by mouth in morning  #30 x 11   Entered and Authorized by:   Julieanne Manson MD   Signed by:   Julieanne Manson MD on 04/30/2009   Method used:   Faxed to ...       [facility] - [facility] (retail)       [address] [place], [place]  [address]       Ph: [phone]       Fax: [phone]   RxID:   [id number] PROTONIX 40 MG  PACK (PANTOPRAZOLE SODIUM) 1 cap by mouth daily  #30 x 11   Entered and Authorized by:   Julieanne Manson MD   Signed by:   Julieanne Manson MD on 04/30/2009   Method used:   Faxed to ...       [facility] - [facility] (retail)       [address] [place], [place]  [address]       Ph: [phone]       Fax: [phone]   RxID:   [id number] LANTUS SOLOSTAR 100 UNIT/ML  SOLN (INSULIN GLARGINE) 40 units subcutaneously nightly  #1 month x 11   Entered and Authorized by:  Julieanne Manson MD   Signed by:   Julieanne Manson MD on 04/30/2009   Method used:   Faxed to ...       [facility] - [facility] (retail)       [address] [place], [place]  [address]       Ph: [phone]       Fax: [phone]   RxID:   [id number] NOVOLOG FLEXPEN 100 UNIT/ML  SOLN (INSULIN ASPART) 18-24  units subcutaneously before meals  #1 month x 11   Entered and Authorized by:   Julieanne Manson MD   Signed by:    Julieanne Manson MD on 04/30/2009   Method used:   Faxed to ...       [facility] - [facility] (retail)       [address] [place], [place]  [address]       Ph: [phone]       Fax: [phone]   RxID:   [id number] DILANTIN 100 MG  CAPS (PHENYTOIN SODIUM EXTENDED) 2 caps by mouth q hs  #60 x 11   Entered and Authorized by:   Julieanne Manson MD   Signed by:   Julieanne Manson MD on 04/30/2009   Method used:   Faxed to ...       [facility] - [facility] (retail)       [address] [place], [place]  [address]       Ph: [phone]       Fax: [phone]   RxID:   [id number] LISINOPRIL 10 MG  TABS (LISINOPRIL) 1 tab by mouth daily  #30 x 11   Entered and Authorized by:   Julieanne Manson MD   Signed by:   Julieanne Manson MD on 04/30/2009   Method used:   Faxed to ...       [facility] - [facility] (retail)       [address] [place], [place]  [address]       Ph: [phone]       Fax: [phone]   RxID:   [id number] PERCOCET 5-325 MG  TABS (OXYCODONE-ACETAMINOPHEN) 2 tabs by mouth q 6h as needed pain  #60 x 0   Entered and Authorized by:   Julieanne Manson MD   Signed by:   Julieanne Manson MD on 04/30/2009   Method used:   Print then Give to Patient   RxID:   [id number]

## 2010-03-09 NOTE — Progress Notes (Signed)
Summary: percocet refill due   Phone Note Call from Patient Call back at [facility] Phone [phone]   Summary of Call: The pt called in to get more refills from percocet medication.  Please call her back when is ready. [place] MD Initial call taken by: Manon Hilding,  May 27, 2009 10:09 AM  Follow-up for Phone Call        Last got #60 on 04/30/09. Follow-up by: Vesta Mixer CMA,  May 27, 2009 10:41 AM  Additional Follow-up for Phone Call Additional follow up Details #1::        Rx on your desk. Can pick up tomorrow.  Additional Follow-up by: Tereso Newcomer PA-C,  May 27, 2009 1:42 PM    Additional Follow-up for Phone Call Additional follow up Details #2::    Pt aware, he will come tomorrow to pick up rx. Follow-up by: Vesta Mixer CMA,  May 27, 2009 2:47 PM  Prescriptions: PERCOCET 5-325 MG  TABS (OXYCODONE-ACETAMINOPHEN) 2 tabs by mouth q 6h as needed pain  #60 x 0   Entered and Authorized by:   Tereso Newcomer PA-C   Signed by:   Tereso Newcomer PA-C on 05/27/2009   Method used:   Print then Give to Patient   RxID:   [id number]

## 2010-03-09 NOTE — Progress Notes (Signed)
Summary: Refill Request  Phone Note Call from Patient   Summary of Call: PT REQUESTING REFILL ON PERCOET//[phone] Initial call taken by: Arta Bruce,  October 27, 2009 10:34 AM  Follow-up for Phone Call        last filled 09/23/09 #60 Follow-up by: Michelle Nasuti,  October 27, 2009 12:14 PM  Additional Follow-up for Phone Call Additional follow up Details #1::        Hospitalized the first week of Sept. for DKA and panreatitis--so pain meds covered an extra week this month, but will go ahead and fill now. May pick up tomorrow Additional Follow-up by: Julieanne Manson MD,  October 29, 2009 6:50 PM    Additional Follow-up for Phone Call Additional follow up Details #2::    [facility] Michelle Nasuti  October 30, 2009 2:47 PM  PT AWARE Michelle Nasuti  November 02, 2009 11:49 AM   Prescriptions: PERCOCET 5-325 MG  TABS (OXYCODONE-ACETAMINOPHEN) 2 tabs by mouth q 6h as needed pain  #60 x 0   Entered and Authorized by:   Julieanne Manson MD   Signed by:   Julieanne Manson MD on 10/29/2009   Method used:   Print then Give to Patient   RxID:   [id number]

## 2010-03-09 NOTE — Letter (Signed)
Summary: DENTAL REFERRAL//REFAXED 01/05/09  DENTAL REFERRAL//REFAXED 01/05/09   Imported By: Silvio Pate Stanislawscyk 02/25/2009 14:38:49  _____________________________________________________________________  External Attachment:    Type:   Image     Comment:   External Document

## 2010-03-09 NOTE — Progress Notes (Signed)
Summary: Deborrah Mabin pt/ pain med request if possible   Phone Note Call from Patient   Summary of Call: Pt was scheduled to see Dr Delrae Alfred yesterday and today. The patient is requesting his refill on percocet last rx written 02/27/09 and pt states he is having pain.  Pt was due to follow up for DM 2 weeks from last visit but we can get by with rescheduling until next Thursday for that particular issue. Initial call taken by: Mikey College CMA,  April 01, 2009 8:35 AM  Follow-up for Phone Call        Rx printed and in basket for pt to pick up. reschedule diabetes f/u with Dr. Delrae Alfred as her scheduled allows Follow-up by: Lehman Prom FNP,  April 01, 2009 8:54 AM  Additional Follow-up for Phone Call Additional follow up Details #1::        pt aware rx ready.  F/u appt made with Dr Delrae Alfred and for dilantin level also. Additional Follow-up by: Vesta Mixer CMA,  April 01, 2009 10:41 AM    Prescriptions: PERCOCET 5-325 MG  TABS (OXYCODONE-ACETAMINOPHEN) 2 tabs by mouth q 6h as needed pain  #60 x 0   Entered and Authorized by:   Lehman Prom FNP   Signed by:   Lehman Prom FNP on 04/01/2009   Method used:   Print then Give to Patient   RxID:   [id number]

## 2010-03-09 NOTE — Progress Notes (Signed)
   Phone Note Outgoing Call   Summary of Call: Debra:  referral to Dr. Lenna Sciara [facility] for follow up of pancreatic pseudocyst--pt. is already known to him--hospital follow up Initial call taken by: Julieanne Manson MD,  September 02, 2009 9:32 PM

## 2010-03-11 NOTE — Progress Notes (Signed)
Summary: MONTHLY PERCOCET DUE  Phone Note Call from Patient Call back at Home Phone [phone]   Reason for Call: Refill Medication Summary of Call: Gabriel Rush PT. [name] CALLING IN FOR HIS PERCOCET. Initial call taken by: Leodis Rains,  January 25, 2010 11:31 AM  Follow-up for Phone Call        [facility] pt Rx due on 01/28/2010 will fill then Follow-up by: Lehman Prom FNP,  January 25, 2010 11:49 AM    Prescriptions: PERCOCET 5-325 MG  TABS (OXYCODONE-ACETAMINOPHEN) 2 tabs by mouth q 6h as needed pain  #60 x 0   Entered and Authorized by:   Julieanne Manson MD   Signed by:   Julieanne Manson MD on 01/27/2010   Method used:   Print then Give to Patient   RxID:   [id number]

## 2010-03-11 NOTE — Progress Notes (Signed)
Summary: Narcotic refill  Phone Note Call from Patient Call back at [facility] Phone [phone]   Summary of Call: pt states need refill on percocet...please call when ready. Initial call taken by: Hassell Halim CMA,  February 24, 2010 11:11 AM  Follow-up for Phone Call        May pick up on Friday Follow-up by: Julieanne Manson MD,  February 25, 2010 10:34 AM  Additional Follow-up for Phone Call Additional follow up Details #1::        Pt. notified. Gaylyn Cheers RN  February 25, 2010 10:42 AM     Prescriptions: PERCOCET 5-325 MG  TABS (OXYCODONE-ACETAMINOPHEN) 2 tabs by mouth q 6h as needed pain  #60 x 0   Entered and Authorized by:   Julieanne Manson MD   Signed by:   Julieanne Manson MD on 02/25/2010   Method used:   Print then Give to Patient   RxID:   [id number]

## 2010-03-11 NOTE — Assessment & Plan Note (Signed)
Summary: fu with Dr Delrae Alfred in 4 months--DM//gk   Vital Signs:  Patient profile:   50 year old male Weight:      215 pounds BMI:     31.41 Temp:     96.9 degrees F oral Pulse rate:   94 / minute Pulse rhythm:   regular Resp:     18 per minute BP sitting:   138 / 100  (left arm) Cuff size:   regular  Vitals Entered By: Hale Drone CMA (December 29, 2009 9:57 AM) CC: Here for a f/u on DM and needs pain meds refills. Pt. had a bagel w/peanut butter and jelly and coffee. Is Patient Diabetic? Yes Did you bring your meter with you today? No Pain Assessment Patient in pain? yes     Location: abdomen Intensity: 6 Type: throbbing/dull Onset of pain  Constant CBG Result 331 CBG Device ID B Non Fasting  Does patient need assistance? Functional Status Self care Ambulation Normal   CC:  Here for a f/u on DM and needs pain meds refills. Pt. had a bagel w/peanut butter and jelly and coffee.Marland Kitchen  History of Present Illness: 1.  Hypertension:  Has not filled meds since 10/4.  Has not taken bp meds at all for at least 2 days.  States takes meds about 3 or 4 days out of the week.  If does not take pills before he leaves home, misses the entire day.  Does not have a pill box. Does have a fanny pack he could take with him when he goes out--to keep a supply of meds if he forgets at home.  Does not have any time constraints that he cannot take his meds before leaving home.    2.  DM:  Sometimes checks sugars--generally in 200s.  Does not take his insulin pens with him if he is leaving the home, so will miss if eats out.  Received flu vaccine today.  3.  Constipation:  states misses meds on purpose as he feels they cause him constipation.  4.  Pancreatic pseudocyst:  Following with general surgery--Dr. Hollice Espy.  Planning to consider interventional radiology  to drain percutanseously.    5.  Pain in right upper back:  Thought he had an abrasion--a burning pain--comes and goes.  Sometimes his  right arm goes numb with this.  Has been going on for about 1 month.  On and off all day.  Current Medications (verified): 1)  Lisinopril 10 Mg  Tabs (Lisinopril) .Marland Kitchen.. 1 Tab By Mouth Daily 2)  Dilantin 100 Mg  Caps (Phenytoin Sodium Extended) .... 2 Caps By Mouth Q Hs 3)  Glucagon Emergency 1 Mg  Kit (Glucagon (Rdna)) .... Use Im/sq As Needed Hypoglycemic Episode 4)  Gluco Burst 40 %  Gel (Dextrose (Diabetic Use)) .Marland Kitchen.. 15g By Mouth As Needed Low Blood Glucose 5)  Novolog Flexpen 100 Unit/ml  Soln (Insulin Aspart) .Marland KitchenMarland KitchenMarland Kitchen 18-24  Units Subcutaneously Before Meals 6)  Lantus Solostar 100 Unit/ml  Soln (Insulin Glargine) .... 40 Units Subcutaneously Nightly 7)  Protonix 40 Mg  Pack (Pantoprazole Sodium) .Marland Kitchen.. 1 Cap By Mouth Daily 8)  Percocet 5-325 Mg  Tabs (Oxycodone-Acetaminophen) .... 2 Tabs By Mouth Q 6h As Needed Pain 9)  Hydrochlorothiazide 25 Mg  Tabs (Hydrochlorothiazide) .Marland Kitchen.. 1 Tab By Mouth in Morning 10)  Glucometer Test   Strp (Glucose Blood) .... Test Three Times A Day 11)  Creon 24000 Unit Cpep (Pancrelipase (Lip-Prot-Amyl)) .Marland Kitchen.. 1 Cap By Mouth Three Times A Day  With Meals 12)  Flomax 0.4 Mg Caps (Tamsulosin Hcl) .Marland Kitchen.. 1 Cap By Mouth With Evening Meal 13)  Gemfibrozil 600 Mg Tabs (Gemfibrozil) .Marland Kitchen.. 1 Tab By Mouth Two Times A Day With Meals 14)  Penicillin V Potassium 500 Mg Tabs (Penicillin V Potassium) .Marland Kitchen.. 1 Tab By Mouth 4 Times Daily  Allergies (verified): No Known Drug Allergies  Physical Exam  Lungs:  Normal respiratory effort, chest expands symmetrically. Lungs are clear to auscultation, no crackles or wheezes. Heart:  Normal rate and regular rhythm. S1 and S2 normal without gallop, murmur, click, rub or other extra sounds.  Radial pulses normal and equal Msk:  Mild tenderness over Cervical spinous processes--no specific process more tender than others.  Tender over paraspinous musculature just adjacent to medial scapula on right. Neurologic:  strength normal in all extremities  and DTRs symmetrical and normal.    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 Donell Beers  Problem # 3:  CONSTIPATION (ICD-564.00) Secondary to Percocet--discusse with pt. he is holding the wrong meds with relation to cause of constipation Start Miralax His updated medication list for this problem includes:    Miralax Powd (Polyethylene glycol 3350) .Marland KitchenMarland KitchenMarland KitchenMarland Kitchen 17 g by mouth in 8 oz water daily  Problem # 4:  HYPERTENSION, BENIGN ESSENTIAL (ICD-401.1) Pill box at home and container for a couple of pills to take with him in case her forgets. Long history of noncompliance His updated medication list for this problem includes:    Lisinopril 10 Mg Tabs (Lisinopril) .Marland Kitchen... 1 tab by mouth daily    Hydrochlorothiazide 25 Mg Tabs (Hydrochlorothiazide) .Marland Kitchen... 1 tab by mouth in morning  Problem # 5:  DIABETES MELLITUS, TYPE II, ON INSULIN (ICD-250.00) AGain, noncompliance is the biggest issue here. To keep a pen with him when goes out of the home. Flu vaccine today His updated medication list for this problem includes:    Lisinopril 10 Mg Tabs (Lisinopril) .Marland Kitchen... 1 tab by mouth daily    Glucagon Emergency 1 Mg Kit (Glucagon (rdna)) ..... Use im/sq as needed hypoglycemic episode    Gluco Burst 40 % Gel (Dextrose (diabetic use)) .Marland KitchenMarland KitchenMarland KitchenMarland Kitchen 15g by mouth as needed low blood glucose    Novolog Flexpen 100 Unit/ml Soln (Insulin aspart) .Marland KitchenMarland KitchenMarland KitchenMarland Kitchen 18-24  units subcutaneously before meals    Lantus Solostar 100 Unit/ml Soln (Insulin glargine) .Marland KitchenMarland KitchenMarland KitchenMarland Kitchen 40 units subcutaneously nightly  Orders: Capillary Blood Glucose/CBG (82948) Hgb A1C (01027OZ)  Complete  Medication List: 1)  Lisinopril 10 Mg Tabs (Lisinopril) .Marland Kitchen.. 1 tab by mouth daily 2)  Dilantin 100 Mg Caps (Phenytoin sodium extended) .... 2 caps by mouth q hs 3)  Glucagon Emergency 1 Mg Kit (Glucagon (rdna)) .... Use im/sq as needed hypoglycemic episode 4)  Gluco Burst 40 % Gel (Dextrose (diabetic use)) .Marland Kitchen.. 15g by mouth as needed low blood glucose 5)  Novolog Flexpen 100 Unit/ml Soln (Insulin aspart) .Marland KitchenMarland KitchenMarland Kitchen 18-24  units subcutaneously before meals 6)  Lantus Solostar 100 Unit/ml Soln (Insulin glargine) .... 40 units subcutaneously nightly 7)  Protonix 40 Mg Pack (Pantoprazole sodium) .Marland Kitchen.. 1 cap by mouth daily  8)  Percocet 5-325 Mg Tabs (Oxycodone-acetaminophen) .... 2 tabs by mouth q 6h as needed pain 9)  Hydrochlorothiazide 25 Mg Tabs (Hydrochlorothiazide) .Marland Kitchen.. 1 tab by mouth in morning 10)  Glucometer Test Strp (glucose Blood)  .... Test three times a day 11)  Creon 24000 Unit Cpep (Pancrelipase (lip-prot-amyl)) .Marland Kitchen.. 1 cap by mouth three times a day with meals 12)  Flomax 0.4 Mg Caps (Tamsulosin hcl) .Marland Kitchen.. 1 cap by mouth with evening meal 13)  Gemfibrozil 600 Mg Tabs (Gemfibrozil) .Marland Kitchen.. 1 tab by mouth two times a day with meals 14)  Miralax Powd (Polyethylene glycol 3350) .Marland KitchenMarland KitchenMarland Kitchen 17 g by mouth in 8 oz water daily  Other Orders: Flu Vaccine 45yrs + (16109) Admin 1st Vaccine (60454)  Patient Instructions: 1)  Pill box to be set up at home. 2)  container for some extra pills to take with you in pack. 3)  Take one of your Novolog pens as well--must use inb 30 days once out of fridge. Prescriptions: PERCOCET 5-325 MG  TABS (OXYCODONE-ACETAMINOPHEN) 2 tabs by mouth q 6h as needed pain  #60 x 0   Entered and Authorized by:   Julieanne Manson MD   Signed by:   Julieanne Manson MD on 12/29/2009   Method used:   Print then Give to Patient   RxID:   [id number] MIRALAX  POWD (POLYETHYLENE GLYCOL 3350) 17 g by mouth in 8 oz water daily  #1 month x 11   Entered and Authorized by:   Julieanne Manson MD   Signed by:   Julieanne Manson MD on 12/29/2009   Method used:   Faxed to ...       [facility] (retail)       [address] [place], [place]  [address]       Ph: [phone]       Fax: [phone]   RxID:   [id number]    Orders Added: 1)  Capillary Blood Glucose/CBG [82948] 2)  Hgb A1C [83036QW] 3)  Flu Vaccine 30yrs + [90658] 4)  Admin 1st Vaccine [90471] 5)  Physical Therapy Referral [PT] 6)  Est. Patient Level IV [95638] 7)  Diagnostic X-Ray/Fluoroscopy [Diagnostic X-Ray/Flu]   Immunizations Administered:  Influenza Vaccine # 1:    Vaccine Type: Fluvax 3+    Site: left deltoid    Mfr: GlaxoSmithKline    Dose: 0.5 ml    Route: IM    Given by: Hale Drone CMA    Exp. Date: 08/07/2010    Lot #: [id number]    VIS given: 09/01/09 version given December 29, 2009.  Flu Vaccine Consent Questions:    Do you have a history of severe allergic reactions to this vaccine? no    Any prior history of allergic reactions to egg and/or gelatin? no    Do you have a sensitivity to the preservative Thimersol? no    Do you have a past history of Guillan-Barre Syndrome? no    Do you currently have an acute febrile illness? no    Have you ever had a severe reaction to latex? no    Vaccine information given and explained to patient? yes   Immunizations Administered:  Influenza Vaccine # 1:    Vaccine Type: Fluvax 3+    Site: left deltoid    Mfr: GlaxoSmithKline    Dose: 0.5 ml    Route: IM    Given by: Hale Drone CMA  Exp. Date: 08/07/2010    Lot #: [id number]    VIS given: 09/01/09 version given December 29, 2009.     Diabetic Foot Exam Foot Inspection Is there a history of a foot ulcer?              No Is there a foot ulcer now?              No Can the patient see the bottom of their feet?          Yes Are the shoes appropriate in style and fit?          Yes Is there swelling or an abnormal foot shape?           No Are the toenails long?                No Are the toenails thick?                No Are the toenails ingrown?              No Is there heavy callous build-up?              No Is there pain in the calf muscle (Intermittent claudication) when walking?    NoIs there a claw toe deformity?              No Is there elevated skin temperature?            No Is there limited ankle dorsiflexion?            No Is there foot or ankle muscle weakness?            No  Diabetic Foot Care Education  High Risk Feet? No   10-g (5.07) Semmes-Weinstein Monofilament Test Performed by: Hale Drone CMA          Right Foot          Left Foot Visual Inspection               Test Control      normal         normal Site 1         normal         normal Site 2         normal         normal Site 3         normal         normal Site 4         normal         normal Site 5         normal         normal Site 6         normal         normal Site 7         normal         normal Site 8         normal         normal Site 9         normal         normal Site 10         normal         normal  Impression      normal         normal   Laboratory Results   Blood Tests   Date/Time Received: December 29, 2009  10:24 AM   HGBA1C: 11.4%   (Normal Range: Non-Diabetic - 3-6%   Control Diabetic - 6-8%) CBG Random:: 331mg /dL      Appended Document: fu with Dr Delrae Alfred in 4 months--DM//gk Pt. was told to follow up in 4 months--written on discharge paper before exiting.

## 2010-03-31 ENCOUNTER — Telehealth (INDEPENDENT_AMBULATORY_CARE_PROVIDER_SITE_OTHER): Payer: Self-pay | Admitting: Internal Medicine

## 2010-04-06 NOTE — Progress Notes (Signed)
Summary: pain med refill  Phone Note Call from Patient   Caller: Patient Reason for Call: Refill Medication Summary of Call: pt needs a refill on his pain meds, percocet Initial call taken by: lisaida rivera    Prescriptions: PERCOCET 5-325 MG  TABS (OXYCODONE-ACETAMINOPHEN) 2 tabs by mouth q 6h as needed pain  #60 x 0   Entered and Authorized by:   Julieanne Manson MD   Signed by:   Julieanne Manson MD on 04/01/2010   Method used:   Print then Give to Patient   RxID:   [id number]

## 2010-04-22 LAB — CBC
HCT: 31.3 % — ABNORMAL LOW (ref 39.0–52.0)
HCT: 33.9 % — ABNORMAL LOW (ref 39.0–52.0)
HCT: 40 % (ref 39.0–52.0)
HCT: 48.2 % (ref 39.0–52.0)
Hemoglobin: 12.2 g/dL — ABNORMAL LOW (ref 13.0–17.0)
Hemoglobin: 17.1 g/dL — ABNORMAL HIGH (ref 13.0–17.0)
MCH: 28.9 pg (ref 26.0–34.0)
MCH: 28.9 pg (ref 26.0–34.0)
MCHC: 35.5 g/dL (ref 30.0–36.0)
MCHC: 35.8 g/dL (ref 30.0–36.0)
MCHC: 36.4 g/dL — ABNORMAL HIGH (ref 30.0–36.0)
MCV: 79.4 fL (ref 78.0–100.0)
MCV: 80.7 fL (ref 78.0–100.0)
MCV: 84 fL (ref 78.0–100.0)
Platelets: 109 10*3/uL — ABNORMAL LOW (ref 150–400)
Platelets: 132 10*3/uL — ABNORMAL LOW (ref 150–400)
Platelets: 164 10*3/uL (ref 150–400)
RBC: 4.17 MIL/uL — ABNORMAL LOW (ref 4.22–5.81)
RBC: 4.19 MIL/uL — ABNORMAL LOW (ref 4.22–5.81)
RDW: 12.7 % (ref 11.5–15.5)
RDW: 13.1 % (ref 11.5–15.5)
RDW: 13.2 % (ref 11.5–15.5)
WBC: 14.4 10*3/uL — ABNORMAL HIGH (ref 4.0–10.5)
WBC: 8.3 10*3/uL (ref 4.0–10.5)

## 2010-04-22 LAB — GLUCOSE, CAPILLARY
Glucose-Capillary: 113 mg/dL — ABNORMAL HIGH (ref 70–99)
Glucose-Capillary: 121 mg/dL — ABNORMAL HIGH (ref 70–99)
Glucose-Capillary: 148 mg/dL — ABNORMAL HIGH (ref 70–99)
Glucose-Capillary: 149 mg/dL — ABNORMAL HIGH (ref 70–99)
Glucose-Capillary: 160 mg/dL — ABNORMAL HIGH (ref 70–99)
Glucose-Capillary: 169 mg/dL — ABNORMAL HIGH (ref 70–99)
Glucose-Capillary: 182 mg/dL — ABNORMAL HIGH (ref 70–99)
Glucose-Capillary: 188 mg/dL — ABNORMAL HIGH (ref 70–99)
Glucose-Capillary: 192 mg/dL — ABNORMAL HIGH (ref 70–99)
Glucose-Capillary: 201 mg/dL — ABNORMAL HIGH (ref 70–99)
Glucose-Capillary: 213 mg/dL — ABNORMAL HIGH (ref 70–99)
Glucose-Capillary: 237 mg/dL — ABNORMAL HIGH (ref 70–99)
Glucose-Capillary: 294 mg/dL — ABNORMAL HIGH (ref 70–99)
Glucose-Capillary: 325 mg/dL — ABNORMAL HIGH (ref 70–99)
Glucose-Capillary: 330 mg/dL — ABNORMAL HIGH (ref 70–99)
Glucose-Capillary: 94 mg/dL (ref 70–99)

## 2010-04-22 LAB — COMPREHENSIVE METABOLIC PANEL
ALT: 23 U/L (ref 0–53)
Albumin: 2.8 g/dL — ABNORMAL LOW (ref 3.5–5.2)
Albumin: 3.4 g/dL — ABNORMAL LOW (ref 3.5–5.2)
Alkaline Phosphatase: 68 U/L (ref 39–117)
Alkaline Phosphatase: 77 U/L (ref 39–117)
BUN: 20 mg/dL (ref 6–23)
BUN: 4 mg/dL — ABNORMAL LOW (ref 6–23)
Calcium: 8.6 mg/dL (ref 8.4–10.5)
Calcium: 9.3 mg/dL (ref 8.4–10.5)
Creatinine, Ser: 0.76 mg/dL (ref 0.4–1.5)
GFR calc Af Amer: >60 mL/min (ref >60)
Glucose, Bld: 142 mg/dL — ABNORMAL HIGH (ref 70–99)
Glucose, Bld: 699 mg/dL (ref 70–99)
Potassium: 2.8 mEq/L — ABNORMAL LOW (ref 3.5–5.1)
Potassium: 3.4 mEq/L — ABNORMAL LOW (ref 3.5–5.1)
Sodium: 127 mEq/L — ABNORMAL LOW (ref 135–145)
Sodium: 135 mEq/L (ref 135–145)
Total Protein: 6.3 g/dL (ref 6.0–8.3)
Total Protein: 7.2 g/dL (ref 6.0–8.3)
Total Protein: 9.3 g/dL — ABNORMAL HIGH (ref 6.0–8.3)

## 2010-04-22 LAB — RETICULOCYTES
RBC.: 4.57 MIL/uL (ref 4.22–5.81)
Retic Count, Absolute: 22.9 10*3/uL (ref 19.0–186.0)

## 2010-04-22 LAB — POCT I-STAT 3, ART BLOOD GAS (G3+)
Acid-base deficit: 11 mmol/L — ABNORMAL HIGH (ref 0.0–2.0)
Bicarbonate: 7.1 mEq/L — ABNORMAL LOW (ref 20.0–24.0)
Patient temperature: 98.9
pCO2 arterial: 17.4 mmHg — CL (ref 35.0–45.0)
pH, Arterial: 7.222 — ABNORMAL LOW (ref 7.350–7.450)
pO2, Arterial: 120 mmHg — ABNORMAL HIGH (ref 80.0–100.0)
pO2, Arterial: 98 mmHg (ref 80.0–100.0)

## 2010-04-22 LAB — URINALYSIS, ROUTINE W REFLEX MICROSCOPIC
Glucose, UA: >1000 mg/dL — AB
Hgb urine dipstick: NEGATIVE
Ketones, ur: 40 mg/dL — AB
Protein, ur: 30 mg/dL — AB
Urobilinogen, UA: 0.2 mg/dL (ref 0.0–1.0)

## 2010-04-22 LAB — BASIC METABOLIC PANEL
BUN: 2 mg/dL — ABNORMAL LOW (ref 6–23)
CO2: 32 mEq/L (ref 19–32)
Calcium: 9.1 mg/dL (ref 8.4–10.5)
Chloride: 98 mEq/L (ref 96–112)
Creatinine, Ser: 0.64 mg/dL (ref 0.4–1.5)
GFR calc Af Amer: >60 mL/min (ref >60)
GFR calc Af Amer: >60 mL/min (ref >60)
GFR calc non Af Amer: >60 mL/min (ref >60)
Glucose, Bld: 129 mg/dL — ABNORMAL HIGH (ref 70–99)
Potassium: 3.4 mEq/L — ABNORMAL LOW (ref 3.5–5.1)
Sodium: 134 mEq/L — ABNORMAL LOW (ref 135–145)

## 2010-04-22 LAB — MRSA PCR SCREENING: MRSA by PCR: NEGATIVE

## 2010-04-22 LAB — DIFFERENTIAL
Basophils Relative: 0 % (ref 0–1)
Eosinophils Absolute: 0 10*3/uL (ref 0.0–0.7)
Lymphocytes Relative: 5 % — ABNORMAL LOW (ref 12–46)
Lymphs Abs: 0.9 10*3/uL (ref 0.7–4.0)
Lymphs Abs: 1.4 10*3/uL (ref 0.7–4.0)
Monocytes Relative: 2 % — ABNORMAL LOW (ref 3–12)
Monocytes Relative: 6 % (ref 3–12)
Neutro Abs: 17.1 10*3/uL — ABNORMAL HIGH (ref 1.7–7.7)
Neutro Abs: 6.3 10*3/uL (ref 1.7–7.7)
Neutrophils Relative %: 76 % (ref 43–77)
Neutrophils Relative %: 93 % — ABNORMAL HIGH (ref 43–77)

## 2010-04-22 LAB — CULTURE, BLOOD (ROUTINE X 2): Culture: NO GROWTH

## 2010-04-22 LAB — URINE CULTURE
Colony Count: NO GROWTH
Culture  Setup Time: 201109030209
Culture: NO GROWTH

## 2010-04-22 LAB — HEMOGLOBIN A1C
Hgb A1c MFr Bld: 13.3 % — ABNORMAL HIGH (ref <5.7)
Mean Plasma Glucose: 335 mg/dL — ABNORMAL HIGH (ref <117)

## 2010-04-22 LAB — VITAMIN B12: Vitamin B-12: 1335 pg/mL — ABNORMAL HIGH (ref 211–911)

## 2010-04-22 LAB — LIPASE, BLOOD: Lipase: 32 U/L (ref 11–59)

## 2010-04-22 LAB — URINE MICROSCOPIC-ADD ON

## 2010-04-22 LAB — MAGNESIUM
Magnesium: 1.7 mg/dL (ref 1.5–2.5)
Magnesium: 1.9 mg/dL (ref 1.5–2.5)

## 2010-04-22 LAB — ETHANOL: Alcohol, Ethyl (B): <5 mg/dL (ref 0–10)

## 2010-04-22 LAB — PHENYTOIN LEVEL, TOTAL
Phenytoin Lvl: <2.5 ug/mL — ABNORMAL LOW (ref 10.0–20.0)
Phenytoin Lvl: <2.5 ug/mL — ABNORMAL LOW (ref 10.0–20.0)
Phenytoin Lvl: <2.5 ug/mL — ABNORMAL LOW (ref 10.0–20.0)

## 2010-04-22 LAB — IRON AND TIBC: Iron: 29 ug/dL — ABNORMAL LOW (ref 42–135)

## 2010-04-22 LAB — FERRITIN: Ferritin: 494 ng/mL — ABNORMAL HIGH (ref 22–322)

## 2010-04-22 LAB — POTASSIUM: Potassium: 3.3 mEq/L — ABNORMAL LOW (ref 3.5–5.1)

## 2010-04-22 LAB — T4, FREE: Free T4: 1.19 ng/dL (ref 0.80–1.80)

## 2010-04-22 LAB — C-PEPTIDE: C-Peptide: 0.21 ng/mL — ABNORMAL LOW (ref 0.80–3.90)

## 2010-04-23 LAB — DIFFERENTIAL
Basophils Relative: 1 % (ref 0–1)
Eosinophils Absolute: 0 10*3/uL (ref 0.0–0.7)
Eosinophils Relative: 0 % (ref 0–5)
Monocytes Absolute: 0.2 10*3/uL (ref 0.1–1.0)
Monocytes Relative: 5 % (ref 3–12)
Neutrophils Relative %: 24 % — ABNORMAL LOW (ref 43–77)

## 2010-04-23 LAB — PROTIME-INR: Prothrombin Time: 12.2 seconds (ref 11.6–15.2)

## 2010-04-23 LAB — ETHANOL: Alcohol, Ethyl (B): 359 mg/dL — ABNORMAL HIGH (ref 0–10)

## 2010-04-23 LAB — COMPREHENSIVE METABOLIC PANEL
ALT: 23 U/L (ref 0–53)
Alkaline Phosphatase: 82 U/L (ref 39–117)
CO2: 24 mEq/L (ref 19–32)
Glucose, Bld: 346 mg/dL — ABNORMAL HIGH (ref 70–99)
Potassium: 3.4 mEq/L — ABNORMAL LOW (ref 3.5–5.1)
Sodium: 137 mEq/L (ref 135–145)
Total Protein: 7.1 g/dL (ref 6.0–8.3)

## 2010-04-23 LAB — CBC
HCT: 40.4 % (ref 39.0–52.0)
Hemoglobin: 14.7 g/dL (ref 13.0–17.0)
RDW: 12.6 % (ref 11.5–15.5)
WBC: 4.7 10*3/uL (ref 4.0–10.5)

## 2010-04-24 LAB — COMPREHENSIVE METABOLIC PANEL
ALT: 28 U/L (ref 0–53)
AST: 23 U/L (ref 0–37)
Alkaline Phosphatase: 66 U/L (ref 39–117)
CO2: 25 mEq/L (ref 19–32)
Chloride: 107 mEq/L (ref 96–112)
GFR calc non Af Amer: >60 mL/min (ref >60)
Glucose, Bld: 332 mg/dL — ABNORMAL HIGH (ref 70–99)
Potassium: 3.5 mEq/L (ref 3.5–5.1)
Sodium: 140 mEq/L (ref 135–145)

## 2010-04-24 LAB — CBC
HCT: 42.4 % (ref 39.0–52.0)
Hemoglobin: 14.7 g/dL (ref 13.0–17.0)
RBC: 4.82 MIL/uL (ref 4.22–5.81)
WBC: 4.7 10*3/uL (ref 4.0–10.5)

## 2010-04-24 LAB — DIFFERENTIAL
Basophils Relative: 0 % (ref 0–1)
Eosinophils Absolute: 0 10*3/uL (ref 0.0–0.7)
Eosinophils Relative: 1 % (ref 0–5)
Neutrophils Relative %: 31 % — ABNORMAL LOW (ref 43–77)

## 2010-04-24 LAB — ETHANOL: Alcohol, Ethyl (B): 356 mg/dL — ABNORMAL HIGH (ref 0–10)

## 2010-04-24 LAB — LIPASE, BLOOD: Lipase: 21 U/L (ref 11–59)

## 2010-04-25 LAB — CBC
HCT: 43.9 % (ref 39.0–52.0)
HCT: 42.9 % (ref 39.0–52.0)
HCT: 44.3 % (ref 39.0–52.0)
Hemoglobin: 15.4 g/dL (ref 13.0–17.0)
Hemoglobin: 15.6 g/dL (ref 13.0–17.0)
Hemoglobin: 14.7 g/dL (ref 13.0–17.0)
Hemoglobin: 15.2 g/dL (ref 13.0–17.0)
MCH: 30.4 pg (ref 26.0–34.0)
MCH: 31.3 pg (ref 26.0–34.0)
MCHC: 35.1 g/dL (ref 30.0–36.0)
MCHC: 33.1 g/dL (ref 30.0–36.0)
MCHC: 35.4 g/dL (ref 30.0–36.0)
MCV: 86.4 fL (ref 78.0–100.0)
MCV: 86.8 fL (ref 78.0–100.0)
MCV: 88.3 fL (ref 78.0–100.0)
Platelets: 213 10*3/uL (ref 150–400)
Platelets: 214 10*3/uL (ref 150–400)
RBC: 4.76 MIL/uL (ref 4.22–5.81)
RBC: 5.06 MIL/uL (ref 4.22–5.81)
RBC: 4.86 MIL/uL (ref 4.22–5.81)
RDW: 12.7 % (ref 11.5–15.5)
WBC: 4.5 10*3/uL (ref 4.0–10.5)
WBC: 6.1 10*3/uL (ref 4.0–10.5)
WBC: 5.2 10*3/uL (ref 4.0–10.5)

## 2010-04-25 LAB — URINE MICROSCOPIC-ADD ON: Urine-Other: NONE SEEN

## 2010-04-25 LAB — URINALYSIS, ROUTINE W REFLEX MICROSCOPIC
Bilirubin Urine: NEGATIVE
Bilirubin Urine: NEGATIVE
Glucose, UA: >1000 mg/dL — AB
Glucose, UA: >1000 mg/dL — AB
Hgb urine dipstick: NEGATIVE
Hgb urine dipstick: NEGATIVE
Ketones, ur: NEGATIVE mg/dL
Leukocytes, UA: NEGATIVE
Leukocytes, UA: NEGATIVE
Leukocytes, UA: NEGATIVE
Nitrite: NEGATIVE
Nitrite: NEGATIVE
Protein, ur: NEGATIVE mg/dL
Protein, ur: NEGATIVE mg/dL
Specific Gravity, Urine: 1.028 (ref 1.005–1.030)
Specific Gravity, Urine: 1.035 — ABNORMAL HIGH (ref 1.005–1.030)
Specific Gravity, Urine: 1.027 (ref 1.005–1.030)
Urobilinogen, UA: 0.2 mg/dL (ref 0.0–1.0)
Urobilinogen, UA: 0.2 mg/dL (ref 0.0–1.0)
pH: 5.5 (ref 5.0–8.0)

## 2010-04-25 LAB — COMPREHENSIVE METABOLIC PANEL
ALT: 34 U/L (ref 0–53)
AST: 21 U/L (ref 0–37)
AST: 29 U/L (ref 0–37)
AST: 54 U/L — ABNORMAL HIGH (ref 0–37)
Albumin: 4.2 g/dL (ref 3.5–5.2)
Alkaline Phosphatase: 61 U/L (ref 39–117)
Alkaline Phosphatase: 82 U/L (ref 39–117)
BUN: 10 mg/dL (ref 6–23)
BUN: 10 mg/dL (ref 6–23)
BUN: 8 mg/dL (ref 6–23)
CO2: 24 mEq/L (ref 19–32)
CO2: 21 mEq/L (ref 19–32)
CO2: 22 mEq/L (ref 19–32)
CO2: 27 mEq/L (ref 19–32)
Calcium: 9.2 mg/dL (ref 8.4–10.5)
Calcium: 8.3 mg/dL — ABNORMAL LOW (ref 8.4–10.5)
Calcium: 9.1 mg/dL (ref 8.4–10.5)
Chloride: 100 mEq/L (ref 96–112)
Chloride: 107 mEq/L (ref 96–112)
Chloride: 93 mEq/L — ABNORMAL LOW (ref 96–112)
Chloride: 105 mEq/L (ref 96–112)
Creatinine, Ser: 0.83 mg/dL (ref 0.4–1.5)
Creatinine, Ser: 1.12 mg/dL (ref 0.4–1.5)
Creatinine, Ser: 0.91 mg/dL (ref 0.4–1.5)
Creatinine, Ser: 1.03 mg/dL (ref 0.4–1.5)
GFR calc Af Amer: >60 mL/min (ref >60)
GFR calc Af Amer: >60 mL/min (ref >60)
GFR calc Af Amer: >60 mL/min (ref >60)
GFR calc non Af Amer: >60 mL/min (ref >60)
GFR calc non Af Amer: >60 mL/min (ref >60)
GFR calc non Af Amer: >60 mL/min (ref >60)
Glucose, Bld: 346 mg/dL — ABNORMAL HIGH (ref 70–99)
Glucose, Bld: 359 mg/dL — ABNORMAL HIGH (ref 70–99)
Potassium: 3.1 mEq/L — ABNORMAL LOW (ref 3.5–5.1)
Potassium: 4.4 mEq/L (ref 3.5–5.1)
Sodium: 141 mEq/L (ref 135–145)
Sodium: 139 mEq/L (ref 135–145)
Total Bilirubin: 0.6 mg/dL (ref 0.3–1.2)
Total Bilirubin: 0.8 mg/dL (ref 0.3–1.2)
Total Bilirubin: 0.7 mg/dL (ref 0.3–1.2)
Total Bilirubin: 0.6 mg/dL (ref 0.3–1.2)
Total Protein: 8 g/dL (ref 6.0–8.3)

## 2010-04-25 LAB — DIFFERENTIAL
Basophils Absolute: 0 10*3/uL (ref 0.0–0.1)
Basophils Absolute: 0 10*3/uL (ref 0.0–0.1)
Basophils Absolute: 0 10*3/uL (ref 0.0–0.1)
Basophils Absolute: 0 10*3/uL (ref 0.0–0.1)
Basophils Absolute: 0.1 10*3/uL (ref 0.0–0.1)
Basophils Relative: 0 % (ref 0–1)
Eosinophils Relative: 0 % (ref 0–5)
Eosinophils Relative: 0 % (ref 0–5)
Eosinophils Relative: 0 % (ref 0–5)
Eosinophils Relative: 1 % (ref 0–5)
Lymphocytes Relative: 52 % — ABNORMAL HIGH (ref 12–46)
Lymphocytes Relative: 56 % — ABNORMAL HIGH (ref 12–46)
Lymphocytes Relative: 36 % (ref 12–46)
Lymphocytes Relative: 57 % — ABNORMAL HIGH (ref 12–46)
Lymphocytes Relative: 62 % — ABNORMAL HIGH (ref 12–46)
Lymphs Abs: 2.3 10*3/uL (ref 0.7–4.0)
Lymphs Abs: 3 10*3/uL (ref 0.7–4.0)
Lymphs Abs: 3.3 10*3/uL (ref 0.7–4.0)
Monocytes Absolute: 0.4 10*3/uL (ref 0.1–1.0)
Monocytes Absolute: 0.3 10*3/uL (ref 0.1–1.0)
Monocytes Relative: 6 % (ref 3–12)
Neutro Abs: 1.8 10*3/uL (ref 1.7–7.7)
Neutro Abs: 3.6 10*3/uL (ref 1.7–7.7)
Neutro Abs: 1.8 10*3/uL (ref 1.7–7.7)
Neutrophils Relative %: 30 % — ABNORMAL LOW (ref 43–77)

## 2010-04-25 LAB — LIPASE, BLOOD
Lipase: 34 U/L (ref 11–59)
Lipase: 14 U/L (ref 11–59)
Lipase: 19 U/L (ref 11–59)

## 2010-04-25 LAB — GLUCOSE, CAPILLARY
Glucose-Capillary: 329 mg/dL — ABNORMAL HIGH (ref 70–99)
Glucose-Capillary: 476 mg/dL — ABNORMAL HIGH (ref 70–99)
Glucose-Capillary: 322 mg/dL — ABNORMAL HIGH (ref 70–99)
Glucose-Capillary: 430 mg/dL — ABNORMAL HIGH (ref 70–99)
Glucose-Capillary: 289 mg/dL — ABNORMAL HIGH (ref 70–99)

## 2010-04-25 LAB — ETHANOL
Alcohol, Ethyl (B): 296 mg/dL — ABNORMAL HIGH (ref 0–10)
Alcohol, Ethyl (B): 351 mg/dL — ABNORMAL HIGH (ref 0–10)

## 2010-04-25 LAB — POCT I-STAT 3, VENOUS BLOOD GAS (G3P V)
Acid-base deficit: 4 mmol/L — ABNORMAL HIGH (ref 0.0–2.0)
pCO2, Ven: 45.6 mmHg (ref 45.0–50.0)
pO2, Ven: 27 mmHg — CL (ref 30.0–45.0)

## 2010-04-25 LAB — RAPID URINE DRUG SCREEN, HOSP PERFORMED
Barbiturates: NOT DETECTED
Barbiturates: NOT DETECTED
Benzodiazepines: NOT DETECTED

## 2010-04-26 LAB — DIFFERENTIAL
Basophils Absolute: 0 10*3/uL (ref 0.0–0.1)
Basophils Absolute: 0 10*3/uL (ref 0.0–0.1)
Basophils Absolute: 0 10*3/uL (ref 0.0–0.1)
Basophils Absolute: 0 10*3/uL (ref 0.0–0.1)
Basophils Absolute: 0.1 10*3/uL (ref 0.0–0.1)
Basophils Relative: 0 % (ref 0–1)
Basophils Relative: 1 % (ref 0–1)
Eosinophils Absolute: 0 10*3/uL (ref 0.0–0.7)
Eosinophils Relative: 1 % (ref 0–5)
Eosinophils Relative: 2 % (ref 0–5)
Eosinophils Relative: 2 % (ref 0–5)
Eosinophils Relative: 2 % (ref 0–5)
Lymphocytes Relative: 40 % (ref 12–46)
Lymphocytes Relative: 25 % (ref 12–46)
Lymphocytes Relative: 41 % (ref 12–46)
Lymphs Abs: 1.8 10*3/uL (ref 0.7–4.0)
Lymphs Abs: 2.1 10*3/uL (ref 0.7–4.0)
Lymphs Abs: 2.2 10*3/uL (ref 0.7–4.0)
Monocytes Absolute: 0.2 10*3/uL (ref 0.1–1.0)
Monocytes Absolute: 0.3 10*3/uL (ref 0.1–1.0)
Monocytes Absolute: 0.2 10*3/uL (ref 0.1–1.0)
Monocytes Absolute: 0.2 10*3/uL (ref 0.1–1.0)
Monocytes Absolute: 0.5 10*3/uL (ref 0.1–1.0)
Monocytes Relative: 2 % — ABNORMAL LOW (ref 3–12)
Monocytes Relative: 5 % (ref 3–12)
Neutro Abs: 2.7 10*3/uL (ref 1.7–7.7)
Neutro Abs: 2.1 10*3/uL (ref 1.7–7.7)
Neutro Abs: 6.2 10*3/uL (ref 1.7–7.7)
Neutrophils Relative %: 48 % (ref 43–77)
Neutrophils Relative %: 72 % (ref 43–77)

## 2010-04-26 LAB — BASIC METABOLIC PANEL
BUN: 2 mg/dL — ABNORMAL LOW (ref 6–23)
CO2: 22 mEq/L (ref 19–32)
CO2: 27 mEq/L (ref 19–32)
Calcium: 8.6 mg/dL (ref 8.4–10.5)
Chloride: 105 mEq/L (ref 96–112)
Creatinine, Ser: 0.71 mg/dL (ref 0.4–1.5)
GFR calc Af Amer: >60 mL/min (ref >60)
GFR calc Af Amer: >60 mL/min (ref >60)
GFR calc non Af Amer: >60 mL/min (ref >60)
GFR calc non Af Amer: >60 mL/min (ref >60)
Glucose, Bld: 250 mg/dL — ABNORMAL HIGH (ref 70–99)
Potassium: 3.7 mEq/L (ref 3.5–5.1)
Potassium: 3.9 mEq/L (ref 3.5–5.1)
Sodium: 139 mEq/L (ref 135–145)

## 2010-04-26 LAB — COMPREHENSIVE METABOLIC PANEL
ALT: 22 U/L (ref 0–53)
AST: 27 U/L (ref 0–37)
AST: 20 U/L (ref 0–37)
AST: 21 U/L (ref 0–37)
AST: 29 U/L (ref 0–37)
AST: 30 U/L (ref 0–37)
Albumin: 3.6 g/dL (ref 3.5–5.2)
Albumin: 3.3 g/dL — ABNORMAL LOW (ref 3.5–5.2)
Albumin: 4 g/dL (ref 3.5–5.2)
Alkaline Phosphatase: 61 U/L (ref 39–117)
Alkaline Phosphatase: 89 U/L (ref 39–117)
BUN: 7 mg/dL (ref 6–23)
BUN: 9 mg/dL (ref 6–23)
CO2: 20 mEq/L (ref 19–32)
CO2: 21 mEq/L (ref 19–32)
CO2: 26 mEq/L (ref 19–32)
Calcium: 9.1 mg/dL (ref 8.4–10.5)
Chloride: 107 mEq/L (ref 96–112)
Chloride: 105 mEq/L (ref 96–112)
Chloride: 102 mEq/L (ref 96–112)
Chloride: 93 mEq/L — ABNORMAL LOW (ref 96–112)
Creatinine, Ser: 0.86 mg/dL (ref 0.4–1.5)
Creatinine, Ser: 0.75 mg/dL (ref 0.4–1.5)
Creatinine, Ser: 0.93 mg/dL (ref 0.4–1.5)
GFR calc Af Amer: >60 mL/min (ref >60)
GFR calc Af Amer: >60 mL/min (ref >60)
GFR calc Af Amer: >60 mL/min (ref >60)
GFR calc Af Amer: >60 mL/min (ref >60)
GFR calc Af Amer: >60 mL/min (ref >60)
GFR calc non Af Amer: >60 mL/min (ref >60)
GFR calc non Af Amer: >60 mL/min (ref >60)
GFR calc non Af Amer: >60 mL/min (ref >60)
Potassium: 3.7 mEq/L (ref 3.5–5.1)
Potassium: 4.3 mEq/L (ref 3.5–5.1)
Sodium: 136 mEq/L (ref 135–145)
Sodium: 135 mEq/L (ref 135–145)
Total Bilirubin: 0.4 mg/dL (ref 0.3–1.2)
Total Bilirubin: 0.4 mg/dL (ref 0.3–1.2)
Total Bilirubin: 0.3 mg/dL (ref 0.3–1.2)
Total Bilirubin: 0.8 mg/dL (ref 0.3–1.2)
Total Protein: 7.6 g/dL (ref 6.0–8.3)

## 2010-04-26 LAB — URINALYSIS, ROUTINE W REFLEX MICROSCOPIC
Bilirubin Urine: NEGATIVE
Glucose, UA: >1000 mg/dL — AB
Glucose, UA: >1000 mg/dL — AB
Glucose, UA: >1000 mg/dL — AB
Hgb urine dipstick: NEGATIVE
Leukocytes, UA: NEGATIVE
Leukocytes, UA: NEGATIVE
Protein, ur: NEGATIVE mg/dL
Protein, ur: NEGATIVE mg/dL
Specific Gravity, Urine: 1.013 (ref 1.005–1.030)
Specific Gravity, Urine: 1.035 — ABNORMAL HIGH (ref 1.005–1.030)
Specific Gravity, Urine: 1.037 — ABNORMAL HIGH (ref 1.005–1.030)
Urobilinogen, UA: 0.2 mg/dL (ref 0.0–1.0)
pH: 6 (ref 5.0–8.0)
pH: 5.5 (ref 5.0–8.0)

## 2010-04-26 LAB — URINE MICROSCOPIC-ADD ON: Urine-Other: NONE SEEN

## 2010-04-26 LAB — GLUCOSE, CAPILLARY
Glucose-Capillary: 278 mg/dL — ABNORMAL HIGH (ref 70–99)
Glucose-Capillary: 449 mg/dL — ABNORMAL HIGH (ref 70–99)
Glucose-Capillary: 292 mg/dL — ABNORMAL HIGH (ref 70–99)
Glucose-Capillary: 387 mg/dL — ABNORMAL HIGH (ref 70–99)
Glucose-Capillary: 393 mg/dL — ABNORMAL HIGH (ref 70–99)
Glucose-Capillary: 159 mg/dL — ABNORMAL HIGH (ref 70–99)
Glucose-Capillary: 161 mg/dL — ABNORMAL HIGH (ref 70–99)
Glucose-Capillary: 175 mg/dL — ABNORMAL HIGH (ref 70–99)
Glucose-Capillary: 184 mg/dL — ABNORMAL HIGH (ref 70–99)
Glucose-Capillary: 194 mg/dL — ABNORMAL HIGH (ref 70–99)
Glucose-Capillary: 269 mg/dL — ABNORMAL HIGH (ref 70–99)
Glucose-Capillary: 272 mg/dL — ABNORMAL HIGH (ref 70–99)
Glucose-Capillary: 415 mg/dL — ABNORMAL HIGH (ref 70–99)
Glucose-Capillary: 439 mg/dL — ABNORMAL HIGH (ref 70–99)

## 2010-04-26 LAB — POCT I-STAT, CHEM 8
BUN: 10 mg/dL (ref 6–23)
Chloride: 105 mEq/L (ref 96–112)
Chloride: 105 mEq/L (ref 96–112)
Glucose, Bld: 355 mg/dL — ABNORMAL HIGH (ref 70–99)
HCT: 41 % (ref 39.0–52.0)
Potassium: 3.6 mEq/L (ref 3.5–5.1)
Sodium: 135 mEq/L (ref 135–145)

## 2010-04-26 LAB — CBC
HCT: 34.5 % — ABNORMAL LOW (ref 39.0–52.0)
HCT: 33.5 % — ABNORMAL LOW (ref 39.0–52.0)
MCHC: 35.3 g/dL (ref 30.0–36.0)
MCHC: 34.5 g/dL (ref 30.0–36.0)
MCHC: 35.6 g/dL (ref 30.0–36.0)
MCV: 88.6 fL (ref 78.0–100.0)
MCV: 89.1 fL (ref 78.0–100.0)
MCV: 89.3 fL (ref 78.0–100.0)
Platelets: 168 10*3/uL (ref 150–400)
Platelets: 177 10*3/uL (ref 150–400)
Platelets: 177 10*3/uL (ref 150–400)
Platelets: 136 10*3/uL — ABNORMAL LOW (ref 150–400)
Platelets: 205 10*3/uL (ref 150–400)
Platelets: 262 10*3/uL (ref 150–400)
RBC: 3.74 MIL/uL — ABNORMAL LOW (ref 4.22–5.81)
RBC: 4.56 MIL/uL (ref 4.22–5.81)
RDW: 13.5 % (ref 11.5–15.5)
RDW: 13 % (ref 11.5–15.5)
RDW: 13.2 % (ref 11.5–15.5)
WBC: 4.8 10*3/uL (ref 4.0–10.5)
WBC: 4.5 10*3/uL (ref 4.0–10.5)
WBC: 4 10*3/uL (ref 4.0–10.5)
WBC: 6.8 10*3/uL (ref 4.0–10.5)

## 2010-04-26 LAB — HEPATIC FUNCTION PANEL
AST: 26 U/L (ref 0–37)
Albumin: 4 g/dL (ref 3.5–5.2)
Albumin: 4.1 g/dL (ref 3.5–5.2)
Total Protein: 7.9 g/dL (ref 6.0–8.3)

## 2010-04-26 LAB — HEMOCCULT GUIAC POC 1CARD (OFFICE): Fecal Occult Bld: NEGATIVE

## 2010-04-26 LAB — APTT: aPTT: 25 seconds (ref 24–37)

## 2010-04-26 LAB — PROTIME-INR: Prothrombin Time: 12.3 seconds (ref 11.6–15.2)

## 2010-04-26 LAB — CARDIAC PANEL(CRET KIN+CKTOT+MB+TROPI)
CK, MB: 1.4 ng/mL (ref 0.3–4.0)
Total CK: 240 U/L — ABNORMAL HIGH (ref 7–232)
Troponin I: 0.04 ng/mL (ref 0.00–0.06)

## 2010-04-26 LAB — POCT CARDIAC MARKERS
CKMB, poc: 1.5 ng/mL (ref 1.0–8.0)
Myoglobin, poc: 106 ng/mL (ref 12–200)
Troponin i, poc: <0.05 ng/mL (ref 0.00–0.09)

## 2010-04-26 LAB — PHENYTOIN LEVEL, TOTAL: Phenytoin Lvl: <2.5 ug/mL — ABNORMAL LOW (ref 10.0–20.0)

## 2010-04-26 LAB — CK TOTAL AND CKMB (NOT AT ARMC)
CK, MB: 1.5 ng/mL (ref 0.3–4.0)
Relative Index: 0.6 (ref 0.0–2.5)

## 2010-04-26 LAB — TROPONIN I: Troponin I: 0.04 ng/mL (ref 0.00–0.06)

## 2010-04-26 LAB — LIPASE, BLOOD
Lipase: 19 U/L (ref 11–59)
Lipase: 17 U/L (ref 11–59)

## 2010-04-26 LAB — ETHANOL
Alcohol, Ethyl (B): 258 mg/dL — ABNORMAL HIGH (ref 0–10)
Alcohol, Ethyl (B): 375 mg/dL — ABNORMAL HIGH (ref 0–10)
Alcohol, Ethyl (B): 294 mg/dL — ABNORMAL HIGH (ref 0–10)

## 2010-04-26 LAB — POCT I-STAT 3, VENOUS BLOOD GAS (G3P V)
Bicarbonate: 24.7 mEq/L — ABNORMAL HIGH (ref 20.0–24.0)
TCO2: 26 mmol/L (ref 0–100)
pCO2, Ven: 43.5 mmHg — ABNORMAL LOW (ref 45.0–50.0)
pH, Ven: 7.362 — ABNORMAL HIGH (ref 7.250–7.300)

## 2010-04-26 LAB — RAPID URINE DRUG SCREEN, HOSP PERFORMED
Amphetamines: NOT DETECTED
Barbiturates: NOT DETECTED
Tetrahydrocannabinol: POSITIVE — AB

## 2010-04-26 LAB — TSH: TSH: 2.26 u[IU]/mL (ref 0.350–4.500)

## 2010-04-27 LAB — DIFFERENTIAL
Basophils Absolute: 0 10*3/uL (ref 0.0–0.1)
Eosinophils Absolute: 0 10*3/uL (ref 0.0–0.7)
Eosinophils Relative: 0 % (ref 0–5)
Lymphocytes Relative: 40 % (ref 12–46)
Lymphocytes Relative: 53 % — ABNORMAL HIGH (ref 12–46)
Lymphs Abs: 2.6 10*3/uL (ref 0.7–4.0)
Lymphs Abs: 3.1 10*3/uL (ref 0.7–4.0)
Monocytes Absolute: 0.4 10*3/uL (ref 0.1–1.0)
Monocytes Relative: 7 % (ref 3–12)
Neutro Abs: 2.4 10*3/uL (ref 1.7–7.7)
Neutrophils Relative %: 42 % — ABNORMAL LOW (ref 43–77)

## 2010-04-27 LAB — COMPREHENSIVE METABOLIC PANEL
BUN: 7 mg/dL (ref 6–23)
CO2: 17 mEq/L — ABNORMAL LOW (ref 19–32)
Calcium: 8.9 mg/dL (ref 8.4–10.5)
Creatinine, Ser: 1.03 mg/dL (ref 0.4–1.5)
GFR calc Af Amer: >60 mL/min (ref >60)
GFR calc non Af Amer: >60 mL/min (ref >60)
Glucose, Bld: 442 mg/dL — ABNORMAL HIGH (ref 70–99)
Total Bilirubin: 1.1 mg/dL (ref 0.3–1.2)

## 2010-04-27 LAB — POCT I-STAT 3, ART BLOOD GAS (G3+)
O2 Saturation: 98 %
Patient temperature: 98.6
pCO2 arterial: 31.1 mmHg — ABNORMAL LOW (ref 35.0–45.0)
pH, Arterial: 7.377 (ref 7.350–7.450)

## 2010-04-27 LAB — BASIC METABOLIC PANEL
BUN: 8 mg/dL (ref 6–23)
Chloride: 93 mEq/L — ABNORMAL LOW (ref 96–112)
GFR calc non Af Amer: >60 mL/min (ref >60)
Potassium: 4.1 mEq/L (ref 3.5–5.1)
Sodium: 129 mEq/L — ABNORMAL LOW (ref 135–145)

## 2010-04-27 LAB — CBC
HCT: 42.1 % (ref 39.0–52.0)
HCT: 44.6 % (ref 39.0–52.0)
Hemoglobin: 15 g/dL (ref 13.0–17.0)
Hemoglobin: 15.5 g/dL (ref 13.0–17.0)
MCHC: 35.6 g/dL (ref 30.0–36.0)
MCV: 88.1 fL (ref 78.0–100.0)
MCV: 88.2 fL (ref 78.0–100.0)
Platelets: 186 10*3/uL (ref 150–400)
RBC: 4.78 MIL/uL (ref 4.22–5.81)
WBC: 6.5 10*3/uL (ref 4.0–10.5)

## 2010-04-27 LAB — URINALYSIS, ROUTINE W REFLEX MICROSCOPIC
Bilirubin Urine: NEGATIVE
Glucose, UA: >1000 mg/dL — AB
Ketones, ur: 15 mg/dL — AB
Protein, ur: 100 mg/dL — AB
pH: 5 (ref 5.0–8.0)

## 2010-04-27 LAB — LACTIC ACID, PLASMA: Lactic Acid, Venous: 4 mmol/L — ABNORMAL HIGH (ref 0.5–2.2)

## 2010-04-27 LAB — RAPID URINE DRUG SCREEN, HOSP PERFORMED
Amphetamines: NOT DETECTED
Benzodiazepines: NOT DETECTED
Tetrahydrocannabinol: POSITIVE — AB

## 2010-04-27 LAB — POCT CARDIAC MARKERS
CKMB, poc: 4 ng/mL (ref 1.0–8.0)
Myoglobin, poc: 298 ng/mL (ref 12–200)
Myoglobin, poc: 91.2 ng/mL (ref 12–200)
Troponin i, poc: <0.05 ng/mL (ref 0.00–0.09)

## 2010-04-27 LAB — ETHANOL
Alcohol, Ethyl (B): 262 mg/dL — ABNORMAL HIGH (ref 0–10)
Alcohol, Ethyl (B): 324 mg/dL — ABNORMAL HIGH (ref 0–10)

## 2010-04-27 LAB — URINE MICROSCOPIC-ADD ON

## 2010-04-27 LAB — LIPASE, BLOOD: Lipase: 23 U/L (ref 11–59)

## 2010-04-27 LAB — SALICYLATE LEVEL: Salicylate Lvl: <4 mg/dL (ref 2.8–20.0)

## 2010-04-28 LAB — URINALYSIS, ROUTINE W REFLEX MICROSCOPIC
Bilirubin Urine: NEGATIVE
Glucose, UA: >1000 mg/dL — AB
Ketones, ur: 15 mg/dL — AB
Leukocytes, UA: NEGATIVE
Protein, ur: 30 mg/dL — AB

## 2010-04-28 LAB — DIFFERENTIAL
Basophils Absolute: 0 10*3/uL (ref 0.0–0.1)
Basophils Relative: 0 % (ref 0–1)
Eosinophils Absolute: 0 10*3/uL (ref 0.0–0.7)
Eosinophils Relative: 1 % (ref 0–5)
Monocytes Absolute: 0.4 10*3/uL (ref 0.1–1.0)

## 2010-04-28 LAB — COMPREHENSIVE METABOLIC PANEL
BUN: 12 mg/dL (ref 6–23)
CO2: 21 mEq/L (ref 19–32)
Calcium: 9 mg/dL (ref 8.4–10.5)
Creatinine, Ser: 0.88 mg/dL (ref 0.4–1.5)
GFR calc Af Amer: >60 mL/min (ref >60)
GFR calc non Af Amer: >60 mL/min (ref >60)
Glucose, Bld: 303 mg/dL — ABNORMAL HIGH (ref 70–99)
Total Bilirubin: 0.5 mg/dL (ref 0.3–1.2)

## 2010-04-28 LAB — CBC
HCT: 40.8 % (ref 39.0–52.0)
Hemoglobin: 14 g/dL (ref 13.0–17.0)
MCHC: 34.3 g/dL (ref 30.0–36.0)
MCV: 88.1 fL (ref 78.0–100.0)
RBC: 4.63 MIL/uL (ref 4.22–5.81)

## 2010-04-28 LAB — GLUCOSE, CAPILLARY

## 2010-04-29 ENCOUNTER — Telehealth (INDEPENDENT_AMBULATORY_CARE_PROVIDER_SITE_OTHER): Payer: Self-pay | Admitting: Internal Medicine

## 2010-04-30 LAB — COMPREHENSIVE METABOLIC PANEL
AST: 21 U/L (ref 0–37)
Albumin: 3.9 g/dL (ref 3.5–5.2)
Chloride: 101 mEq/L (ref 96–112)
Creatinine, Ser: 0.77 mg/dL (ref 0.4–1.5)
GFR calc Af Amer: >60 mL/min (ref >60)
Potassium: 3.5 mEq/L (ref 3.5–5.1)
Total Bilirubin: 0.4 mg/dL (ref 0.3–1.2)

## 2010-04-30 LAB — DIFFERENTIAL
Basophils Absolute: 0.1 10*3/uL (ref 0.0–0.1)
Eosinophils Relative: 1 % (ref 0–5)
Lymphocytes Relative: 48 % — ABNORMAL HIGH (ref 12–46)
Monocytes Absolute: 0.3 10*3/uL (ref 0.1–1.0)

## 2010-04-30 LAB — CBC
MCV: 87.2 fL (ref 78.0–100.0)
Platelets: 187 10*3/uL (ref 150–400)
WBC: 5.5 10*3/uL (ref 4.0–10.5)

## 2010-04-30 LAB — GLUCOSE, CAPILLARY: Glucose-Capillary: 249 mg/dL — ABNORMAL HIGH (ref 70–99)

## 2010-04-30 LAB — ETHANOL: Alcohol, Ethyl (B): 204 mg/dL — ABNORMAL HIGH (ref 0–10)

## 2010-05-02 LAB — CARDIAC PANEL(CRET KIN+CKTOT+MB+TROPI)
Relative Index: 0.8 (ref 0.0–2.5)
Relative Index: 1 (ref 0.0–2.5)
Total CK: 195 U/L (ref 7–232)
Troponin I: 0.01 ng/mL (ref 0.00–0.06)

## 2010-05-02 LAB — COMPREHENSIVE METABOLIC PANEL WITH GFR
ALT: 19 U/L (ref 0–53)
ALT: 27 U/L (ref 0–53)
AST: 24 U/L (ref 0–37)
AST: 40 U/L — ABNORMAL HIGH (ref 0–37)
Albumin: 3 g/dL — ABNORMAL LOW (ref 3.5–5.2)
Albumin: 3.5 g/dL (ref 3.5–5.2)
Alkaline Phosphatase: 74 U/L (ref 39–117)
Alkaline Phosphatase: 75 U/L (ref 39–117)
BUN: 3 mg/dL — ABNORMAL LOW (ref 6–23)
BUN: 4 mg/dL — ABNORMAL LOW (ref 6–23)
CO2: 25 meq/L (ref 19–32)
CO2: 25 meq/L (ref 19–32)
Calcium: 8.2 mg/dL — ABNORMAL LOW (ref 8.4–10.5)
Calcium: 8.5 mg/dL (ref 8.4–10.5)
Chloride: 101 meq/L (ref 96–112)
Chloride: 97 meq/L (ref 96–112)
Creatinine, Ser: 0.65 mg/dL (ref 0.4–1.5)
Creatinine, Ser: 0.68 mg/dL (ref 0.4–1.5)
GFR calc non Af Amer: >60 mL/min
GFR calc non Af Amer: >60 mL/min
Glucose, Bld: 217 mg/dL — ABNORMAL HIGH (ref 70–99)
Glucose, Bld: 245 mg/dL — ABNORMAL HIGH (ref 70–99)
Potassium: 3.5 meq/L (ref 3.5–5.1)
Potassium: 4 meq/L (ref 3.5–5.1)
Sodium: 129 meq/L — ABNORMAL LOW (ref 135–145)
Sodium: 131 meq/L — ABNORMAL LOW (ref 135–145)
Total Bilirubin: 0.5 mg/dL (ref 0.3–1.2)
Total Bilirubin: 0.8 mg/dL (ref 0.3–1.2)
Total Protein: 6 g/dL (ref 6.0–8.3)
Total Protein: 6.7 g/dL (ref 6.0–8.3)

## 2010-05-02 LAB — COMPREHENSIVE METABOLIC PANEL
BUN: 9 mg/dL (ref 6–23)
CO2: 22 mEq/L (ref 19–32)
Calcium: 9.4 mg/dL (ref 8.4–10.5)
Creatinine, Ser: 0.81 mg/dL (ref 0.4–1.5)
GFR calc non Af Amer: >60 mL/min (ref >60)
Glucose, Bld: 453 mg/dL — ABNORMAL HIGH (ref 70–99)

## 2010-05-02 LAB — GLUCOSE, CAPILLARY
Glucose-Capillary: 181 mg/dL — ABNORMAL HIGH (ref 70–99)
Glucose-Capillary: 195 mg/dL — ABNORMAL HIGH (ref 70–99)
Glucose-Capillary: 227 mg/dL — ABNORMAL HIGH (ref 70–99)
Glucose-Capillary: 233 mg/dL — ABNORMAL HIGH (ref 70–99)
Glucose-Capillary: 236 mg/dL — ABNORMAL HIGH (ref 70–99)
Glucose-Capillary: 255 mg/dL — ABNORMAL HIGH (ref 70–99)
Glucose-Capillary: 257 mg/dL — ABNORMAL HIGH (ref 70–99)
Glucose-Capillary: 260 mg/dL — ABNORMAL HIGH (ref 70–99)
Glucose-Capillary: 265 mg/dL — ABNORMAL HIGH (ref 70–99)
Glucose-Capillary: 266 mg/dL — ABNORMAL HIGH (ref 70–99)
Glucose-Capillary: 269 mg/dL — ABNORMAL HIGH (ref 70–99)
Glucose-Capillary: 299 mg/dL — ABNORMAL HIGH (ref 70–99)

## 2010-05-02 LAB — DIFFERENTIAL
Blasts: 0 %
Eosinophils Absolute: 0.1 10*3/uL (ref 0.0–0.7)
Eosinophils Relative: 1 % (ref 0–5)
Monocytes Absolute: 0.4 10*3/uL (ref 0.1–1.0)
Monocytes Relative: 7 % (ref 3–12)
Neutro Abs: 2.9 10*3/uL (ref 1.7–7.7)
Neutrophils Relative %: 51 % (ref 43–77)
nRBC: 0 /100 WBC

## 2010-05-02 LAB — CBC
HCT: 34 % — ABNORMAL LOW (ref 39.0–52.0)
HCT: 43 % (ref 39.0–52.0)
Hemoglobin: 12.1 g/dL — ABNORMAL LOW (ref 13.0–17.0)
Hemoglobin: 15.3 g/dL (ref 13.0–17.0)
MCHC: 35.3 g/dL (ref 30.0–36.0)
MCHC: 35.5 g/dL (ref 30.0–36.0)
MCHC: 35.7 g/dL (ref 30.0–36.0)
MCV: 86.4 fL (ref 78.0–100.0)
MCV: 87.5 fL (ref 78.0–100.0)
Platelets: 114 10*3/uL — ABNORMAL LOW (ref 150–400)
RBC: 3.89 MIL/uL — ABNORMAL LOW (ref 4.22–5.81)
RBC: 4.27 MIL/uL (ref 4.22–5.81)
RBC: 4.98 MIL/uL (ref 4.22–5.81)
RDW: 12.9 % (ref 11.5–15.5)
RDW: 13.1 % (ref 11.5–15.5)
WBC: 4 10*3/uL (ref 4.0–10.5)

## 2010-05-02 LAB — LIPID PANEL
HDL: 69 mg/dL
Total CHOL/HDL Ratio: 8.1 ratio
Triglycerides: 1402 mg/dL — ABNORMAL HIGH
VLDL: UNDETERMINED mg/dL (ref 0–40)

## 2010-05-02 LAB — MAGNESIUM: Magnesium: 1.8 mg/dL (ref 1.5–2.5)

## 2010-05-02 LAB — APTT: aPTT: 24 seconds (ref 24–37)

## 2010-05-02 LAB — HEMOGLOBIN A1C: Mean Plasma Glucose: 392 mg/dL

## 2010-05-02 LAB — LIPASE, BLOOD
Lipase: 11 U/L (ref 11–59)
Lipase: 17 U/L (ref 11–59)

## 2010-05-02 LAB — PHENYTOIN LEVEL, TOTAL: Phenytoin Lvl: <2.5 ug/mL — ABNORMAL LOW (ref 10.0–20.0)

## 2010-05-02 LAB — TSH: TSH: 1.202 u[IU]/mL (ref 0.350–4.500)

## 2010-05-02 LAB — CK TOTAL AND CKMB (NOT AT ARMC)
CK, MB: 1.4 ng/mL (ref 0.3–4.0)
Total CK: 40 U/L (ref 7–232)

## 2010-05-02 LAB — PHOSPHORUS: Phosphorus: 4.2 mg/dL (ref 2.3–4.6)

## 2010-05-03 LAB — URINALYSIS, ROUTINE W REFLEX MICROSCOPIC
Glucose, UA: >1000 mg/dL — AB
Leukocytes, UA: NEGATIVE
Protein, ur: NEGATIVE mg/dL
Urobilinogen, UA: 0.2 mg/dL (ref 0.0–1.0)

## 2010-05-03 LAB — POCT I-STAT, CHEM 8
BUN: 7 mg/dL (ref 6–23)
Calcium, Ion: 1.1 mmol/L — ABNORMAL LOW (ref 1.12–1.32)
Chloride: 106 mEq/L (ref 96–112)
Potassium: 4.1 mEq/L (ref 3.5–5.1)

## 2010-05-03 LAB — URINE MICROSCOPIC-ADD ON

## 2010-05-03 LAB — ETHANOL: Alcohol, Ethyl (B): 272 mg/dL — ABNORMAL HIGH (ref 0–10)

## 2010-05-03 LAB — DIFFERENTIAL
Basophils Absolute: 0.1 10*3/uL (ref 0.0–0.1)
Eosinophils Relative: 1 % (ref 0–5)
Lymphocytes Relative: 34 % (ref 12–46)
Lymphocytes Relative: 52 % — ABNORMAL HIGH (ref 12–46)
Lymphs Abs: 2.8 10*3/uL (ref 0.7–4.0)
Monocytes Absolute: 0.2 10*3/uL (ref 0.1–1.0)
Monocytes Absolute: 0.3 10*3/uL (ref 0.1–1.0)
Monocytes Relative: 3 % (ref 3–12)
Neutro Abs: 2.4 10*3/uL (ref 1.7–7.7)
Neutro Abs: 3.2 10*3/uL (ref 1.7–7.7)
Neutrophils Relative %: 57 % (ref 43–77)

## 2010-05-03 LAB — COMPREHENSIVE METABOLIC PANEL
ALT: 27 U/L (ref 0–53)
AST: 35 U/L (ref 0–37)
Albumin: 3.9 g/dL (ref 3.5–5.2)
CO2: 19 mEq/L (ref 19–32)
Calcium: 8.5 mg/dL (ref 8.4–10.5)
Chloride: 101 mEq/L (ref 96–112)
GFR calc Af Amer: >60 mL/min (ref >60)
GFR calc non Af Amer: >60 mL/min (ref >60)
Sodium: 133 mEq/L — ABNORMAL LOW (ref 135–145)
Total Bilirubin: 0.2 mg/dL — ABNORMAL LOW (ref 0.3–1.2)

## 2010-05-03 LAB — HEPATIC FUNCTION PANEL
ALT: 30 U/L (ref 0–53)
AST: 29 U/L (ref 0–37)
Albumin: 3.8 g/dL (ref 3.5–5.2)
Bilirubin, Direct: <0.1 mg/dL (ref 0.0–0.3)
Total Protein: 7.4 g/dL (ref 6.0–8.3)

## 2010-05-03 LAB — CBC
HCT: 41.9 % (ref 39.0–52.0)
Hemoglobin: 13.9 g/dL (ref 13.0–17.0)
Hemoglobin: 14.4 g/dL (ref 13.0–17.0)
RBC: 4.59 MIL/uL (ref 4.22–5.81)
RBC: 4.69 MIL/uL (ref 4.22–5.81)
RDW: 13.4 % (ref 11.5–15.5)
WBC: 5.4 10*3/uL (ref 4.0–10.5)

## 2010-05-03 LAB — GLUCOSE, CAPILLARY
Glucose-Capillary: 265 mg/dL — ABNORMAL HIGH (ref 70–99)
Glucose-Capillary: 278 mg/dL — ABNORMAL HIGH (ref 70–99)
Glucose-Capillary: 306 mg/dL — ABNORMAL HIGH (ref 70–99)
Glucose-Capillary: 334 mg/dL — ABNORMAL HIGH (ref 70–99)
Glucose-Capillary: 427 mg/dL — ABNORMAL HIGH (ref 70–99)

## 2010-05-06 NOTE — Progress Notes (Signed)
Summary: PERCOCET DUE  Phone Note Call from Patient Call back at Home Phone [phone]   Reason for Call: Refill Medication Summary of Call: Gabriel Rush PT. MR Gabriel Rush STOPPED BY TO GET HIS MONTHLY RX FOR HIS PERCOCET,DUE 03/22 Initial call taken by: Gabriel Rush,  April 29, 2010 9:09 AM  Follow-up for Phone Call        Will foward to Dr. Delrae Alfred.... Hale Drone CMA  April 29, 2010 10:03 AM     Prescriptions: PERCOCET 5-325 MG  TABS (OXYCODONE-ACETAMINOPHEN) 2 tabs by mouth q 6h as needed pain  #60 x 0   Entered and Authorized by:   Julieanne Manson MD   Signed by:   Julieanne Manson MD on 04/29/2010   Method used:   Print then Give to Patient   RxID:   [id number]   Appended Document: PERCOCET DUE Please make sure he has a follow up appt. in next 4 months

## 2010-05-11 LAB — PHOSPHORUS: Phosphorus: 1.4 mg/dL — ABNORMAL LOW (ref 2.3–4.6)

## 2010-05-11 LAB — GLUCOSE, CAPILLARY
Glucose-Capillary: 143 mg/dL — ABNORMAL HIGH (ref 70–99)
Glucose-Capillary: 153 mg/dL — ABNORMAL HIGH (ref 70–99)
Glucose-Capillary: 156 mg/dL — ABNORMAL HIGH (ref 70–99)
Glucose-Capillary: 164 mg/dL — ABNORMAL HIGH (ref 70–99)
Glucose-Capillary: 166 mg/dL — ABNORMAL HIGH (ref 70–99)
Glucose-Capillary: 186 mg/dL — ABNORMAL HIGH (ref 70–99)
Glucose-Capillary: 193 mg/dL — ABNORMAL HIGH (ref 70–99)
Glucose-Capillary: 199 mg/dL — ABNORMAL HIGH (ref 70–99)
Glucose-Capillary: 214 mg/dL — ABNORMAL HIGH (ref 70–99)
Glucose-Capillary: 231 mg/dL — ABNORMAL HIGH (ref 70–99)
Glucose-Capillary: 233 mg/dL — ABNORMAL HIGH (ref 70–99)
Glucose-Capillary: 239 mg/dL — ABNORMAL HIGH (ref 70–99)
Glucose-Capillary: 246 mg/dL — ABNORMAL HIGH (ref 70–99)
Glucose-Capillary: 262 mg/dL — ABNORMAL HIGH (ref 70–99)
Glucose-Capillary: 292 mg/dL — ABNORMAL HIGH (ref 70–99)
Glucose-Capillary: 330 mg/dL — ABNORMAL HIGH (ref 70–99)
Glucose-Capillary: 365 mg/dL — ABNORMAL HIGH (ref 70–99)
Glucose-Capillary: 462 mg/dL — ABNORMAL HIGH (ref 70–99)

## 2010-05-11 LAB — BASIC METABOLIC PANEL
BUN: 12 mg/dL (ref 6–23)
BUN: 16 mg/dL (ref 6–23)
BUN: 21 mg/dL (ref 6–23)
BUN: 7 mg/dL (ref 6–23)
BUN: 9 mg/dL (ref 6–23)
CO2: 12 mEq/L — ABNORMAL LOW (ref 19–32)
CO2: 18 mEq/L — ABNORMAL LOW (ref 19–32)
CO2: 20 mEq/L (ref 19–32)
CO2: 20 mEq/L (ref 19–32)
CO2: 23 mEq/L (ref 19–32)
CO2: 26 mEq/L (ref 19–32)
CO2: 26 mEq/L (ref 19–32)
Calcium: 8.9 mg/dL (ref 8.4–10.5)
Calcium: 9 mg/dL (ref 8.4–10.5)
Calcium: 9.1 mg/dL (ref 8.4–10.5)
Calcium: 9.1 mg/dL (ref 8.4–10.5)
Calcium: 9.3 mg/dL (ref 8.4–10.5)
Calcium: 9.5 mg/dL (ref 8.4–10.5)
Calcium: 9.6 mg/dL (ref 8.4–10.5)
Chloride: 100 mEq/L (ref 96–112)
Chloride: 101 mEq/L (ref 96–112)
Chloride: 101 mEq/L (ref 96–112)
Chloride: 101 mEq/L (ref 96–112)
Chloride: 103 mEq/L (ref 96–112)
Chloride: 103 mEq/L (ref 96–112)
Chloride: 104 mEq/L (ref 96–112)
Chloride: 105 mEq/L (ref 96–112)
Creatinine, Ser: 0.86 mg/dL (ref 0.4–1.5)
Creatinine, Ser: 1.02 mg/dL (ref 0.4–1.5)
Creatinine, Ser: 1.07 mg/dL (ref 0.4–1.5)
Creatinine, Ser: 1.26 mg/dL (ref 0.4–1.5)
GFR calc Af Amer: >60 mL/min (ref >60)
GFR calc Af Amer: >60 mL/min (ref >60)
GFR calc Af Amer: >60 mL/min (ref >60)
GFR calc Af Amer: >60 mL/min (ref >60)
GFR calc Af Amer: >60 mL/min (ref >60)
GFR calc Af Amer: >60 mL/min (ref >60)
GFR calc Af Amer: >60 mL/min (ref >60)
GFR calc Af Amer: >60 mL/min (ref >60)
GFR calc non Af Amer: 54 mL/min — ABNORMAL LOW (ref >60)
GFR calc non Af Amer: >60 mL/min (ref >60)
GFR calc non Af Amer: >60 mL/min (ref >60)
GFR calc non Af Amer: >60 mL/min (ref >60)
GFR calc non Af Amer: >60 mL/min (ref >60)
Glucose, Bld: 166 mg/dL — ABNORMAL HIGH (ref 70–99)
Glucose, Bld: 186 mg/dL — ABNORMAL HIGH (ref 70–99)
Glucose, Bld: 223 mg/dL — ABNORMAL HIGH (ref 70–99)
Glucose, Bld: 223 mg/dL — ABNORMAL HIGH (ref 70–99)
Glucose, Bld: 228 mg/dL — ABNORMAL HIGH (ref 70–99)
Glucose, Bld: 234 mg/dL — ABNORMAL HIGH (ref 70–99)
Glucose, Bld: 268 mg/dL — ABNORMAL HIGH (ref 70–99)
Glucose, Bld: 290 mg/dL — ABNORMAL HIGH (ref 70–99)
Potassium: 2.6 mEq/L — CL (ref 3.5–5.1)
Potassium: 2.6 mEq/L — CL (ref 3.5–5.1)
Potassium: 3.2 mEq/L — ABNORMAL LOW (ref 3.5–5.1)
Potassium: 3.6 mEq/L (ref 3.5–5.1)
Potassium: 4.1 mEq/L (ref 3.5–5.1)
Potassium: 4.9 mEq/L (ref 3.5–5.1)
Sodium: 130 mEq/L — ABNORMAL LOW (ref 135–145)
Sodium: 131 mEq/L — ABNORMAL LOW (ref 135–145)
Sodium: 133 mEq/L — ABNORMAL LOW (ref 135–145)
Sodium: 134 mEq/L — ABNORMAL LOW (ref 135–145)
Sodium: 135 mEq/L (ref 135–145)
Sodium: 136 mEq/L (ref 135–145)
Sodium: 137 mEq/L (ref 135–145)
Sodium: 137 mEq/L (ref 135–145)
Sodium: 137 mEq/L (ref 135–145)

## 2010-05-11 LAB — CBC
Hemoglobin: 12.1 g/dL — ABNORMAL LOW (ref 13.0–17.0)
Hemoglobin: 12.2 g/dL — ABNORMAL LOW (ref 13.0–17.0)
Hemoglobin: 12.4 g/dL — ABNORMAL LOW (ref 13.0–17.0)
MCHC: 34.4 g/dL (ref 30.0–36.0)
MCHC: 34.6 g/dL (ref 30.0–36.0)
MCV: 86.2 fL (ref 78.0–100.0)
MCV: 86.4 fL (ref 78.0–100.0)
Platelets: 250 10*3/uL (ref 150–400)
RBC: 4.1 MIL/uL — ABNORMAL LOW (ref 4.22–5.81)
RBC: 4.1 MIL/uL — ABNORMAL LOW (ref 4.22–5.81)
RBC: 4.17 MIL/uL — ABNORMAL LOW (ref 4.22–5.81)
RDW: 13.3 % (ref 11.5–15.5)
RDW: 13.7 % (ref 11.5–15.5)
WBC: 7.4 10*3/uL (ref 4.0–10.5)

## 2010-05-11 LAB — DIFFERENTIAL
Basophils Absolute: 0 10*3/uL (ref 0.0–0.1)
Basophils Absolute: 0 10*3/uL (ref 0.0–0.1)
Basophils Relative: 0 % (ref 0–1)
Basophils Relative: 1 % (ref 0–1)
Eosinophils Absolute: 0.1 10*3/uL (ref 0.0–0.7)
Eosinophils Relative: 1 % (ref 0–5)
Lymphocytes Relative: 17 % (ref 12–46)
Lymphocytes Relative: 27 % (ref 12–46)
Lymphs Abs: 2 10*3/uL (ref 0.7–4.0)
Monocytes Absolute: 0.6 10*3/uL (ref 0.1–1.0)
Monocytes Absolute: 0.7 10*3/uL (ref 0.1–1.0)
Monocytes Absolute: 0.8 10*3/uL (ref 0.1–1.0)
Monocytes Relative: 10 % (ref 3–12)
Monocytes Relative: 11 % (ref 3–12)
Neutro Abs: 4.6 10*3/uL (ref 1.7–7.7)
Neutro Abs: 6.6 10*3/uL (ref 1.7–7.7)
Neutrophils Relative %: 75 % (ref 43–77)

## 2010-05-11 LAB — LACTIC ACID, PLASMA
Lactic Acid, Venous: 0.8 mmol/L (ref 0.5–2.2)
Lactic Acid, Venous: 1 mmol/L (ref 0.5–2.2)
Lactic Acid, Venous: 1.9 mmol/L (ref 0.5–2.2)

## 2010-05-11 LAB — MRSA PCR SCREENING: MRSA by PCR: NEGATIVE

## 2010-05-11 LAB — POCT I-STAT 3, ART BLOOD GAS (G3+)
Acid-base deficit: 22 mmol/L — ABNORMAL HIGH (ref 0.0–2.0)
Bicarbonate: 4.8 mEq/L — ABNORMAL LOW (ref 20.0–24.0)
pCO2 arterial: 14 mmHg — CL (ref 35.0–45.0)
pO2, Arterial: 114 mmHg — ABNORMAL HIGH (ref 80.0–100.0)

## 2010-05-11 LAB — MAGNESIUM: Magnesium: 2.1 mg/dL (ref 1.5–2.5)

## 2010-05-11 LAB — TROPONIN I
Troponin I: 0.01 ng/mL (ref 0.00–0.06)
Troponin I: 0.02 ng/mL (ref 0.00–0.06)
Troponin I: 0.04 ng/mL (ref 0.00–0.06)

## 2010-05-11 LAB — LIPASE, BLOOD: Lipase: <10 U/L — ABNORMAL LOW (ref 11–59)

## 2010-05-11 LAB — HEPATIC FUNCTION PANEL
Alkaline Phosphatase: 108 U/L (ref 39–117)
Bilirubin, Direct: <0.1 mg/dL (ref 0.0–0.3)
Total Protein: 8.5 g/dL — ABNORMAL HIGH (ref 6.0–8.3)

## 2010-05-12 LAB — URINE MICROSCOPIC-ADD ON

## 2010-05-12 LAB — URINALYSIS, ROUTINE W REFLEX MICROSCOPIC
Ketones, ur: >80 mg/dL — AB
Leukocytes, UA: NEGATIVE
Nitrite: NEGATIVE
Protein, ur: 100 mg/dL — AB
Urobilinogen, UA: 0.2 mg/dL (ref 0.0–1.0)
pH: 5 (ref 5.0–8.0)

## 2010-05-12 LAB — DIFFERENTIAL
Eosinophils Relative: 0 % (ref 0–5)
Lymphocytes Relative: 10 % — ABNORMAL LOW (ref 12–46)
Lymphs Abs: 1.6 10*3/uL (ref 0.7–4.0)

## 2010-05-12 LAB — BASIC METABOLIC PANEL
BUN: 26 mg/dL — ABNORMAL HIGH (ref 6–23)
GFR calc non Af Amer: 30 mL/min — ABNORMAL LOW (ref >60)
Potassium: 6.6 mEq/L (ref 3.5–5.1)
Sodium: 127 mEq/L — ABNORMAL LOW (ref 135–145)

## 2010-05-12 LAB — CULTURE, BLOOD (ROUTINE X 2): Culture: NO GROWTH

## 2010-05-12 LAB — KETONES, QUALITATIVE: Acetone, Bld: POSITIVE — AB

## 2010-05-12 LAB — GLUCOSE, CAPILLARY
Glucose-Capillary: 571 mg/dL (ref 70–99)
Glucose-Capillary: >600 mg/dL (ref 70–99)

## 2010-05-12 LAB — CBC
HCT: 47.8 % (ref 39.0–52.0)
Hemoglobin: 16 g/dL (ref 13.0–17.0)
Platelets: 343 10*3/uL (ref 150–400)
WBC: 16.8 10*3/uL — ABNORMAL HIGH (ref 4.0–10.5)

## 2010-08-20 ENCOUNTER — Emergency Department (HOSPITAL_COMMUNITY)
Admission: EM | Admit: 2010-08-20 | Discharge: 2010-08-20 | Disposition: A | Discharge status: Home / Self Care | Payer: Self-pay | Attending: Emergency Medicine | Admitting: Emergency Medicine

## 2010-08-20 DIAGNOSIS — E119 Type 2 diabetes mellitus without complications: Secondary | ICD-10-CM | POA: Insufficient documentation

## 2010-08-20 DIAGNOSIS — Z79899 Other long term (current) drug therapy: Secondary | ICD-10-CM | POA: Insufficient documentation

## 2010-08-20 DIAGNOSIS — N289 Disorder of kidney and ureter, unspecified: Secondary | ICD-10-CM | POA: Insufficient documentation

## 2010-08-20 DIAGNOSIS — F1010 Alcohol abuse, uncomplicated: Secondary | ICD-10-CM | POA: Insufficient documentation

## 2010-08-20 DIAGNOSIS — R1110 Vomiting, unspecified: Secondary | ICD-10-CM | POA: Insufficient documentation

## 2010-08-20 DIAGNOSIS — K861 Other chronic pancreatitis: Secondary | ICD-10-CM | POA: Insufficient documentation

## 2010-08-20 DIAGNOSIS — I10 Essential (primary) hypertension: Secondary | ICD-10-CM | POA: Insufficient documentation

## 2010-08-20 DIAGNOSIS — G40909 Epilepsy, unspecified, not intractable, without status epilepticus: Secondary | ICD-10-CM | POA: Insufficient documentation

## 2010-08-20 DIAGNOSIS — Z794 Long term (current) use of insulin: Secondary | ICD-10-CM | POA: Insufficient documentation

## 2010-08-20 DIAGNOSIS — R000 Tachycardia, unspecified: Secondary | ICD-10-CM | POA: Insufficient documentation

## 2010-08-20 DIAGNOSIS — R1013 Epigastric pain: Secondary | ICD-10-CM | POA: Insufficient documentation

## 2010-08-20 LAB — DIFFERENTIAL
Basophils Relative: 1 % (ref 0–1)
Lymphocytes Relative: 57 % — ABNORMAL HIGH (ref 12–46)
Monocytes Absolute: 0.2 10*3/uL (ref 0.1–1.0)
Monocytes Relative: 4 % (ref 3–12)
Neutro Abs: 2.2 10*3/uL (ref 1.7–7.7)
Neutrophils Relative %: 38 % — ABNORMAL LOW (ref 43–77)

## 2010-08-20 LAB — HEPATIC FUNCTION PANEL
ALT: 26 U/L (ref 0–53)
Albumin: 3.6 g/dL (ref 3.5–5.2)
Alkaline Phosphatase: 116 U/L (ref 39–117)
Indirect Bilirubin: 0.1 mg/dL — ABNORMAL LOW (ref 0.3–0.9)
Total Bilirubin: 0.2 mg/dL — ABNORMAL LOW (ref 0.3–1.2)
Total Protein: 7.8 g/dL (ref 6.0–8.3)

## 2010-08-20 LAB — GLUCOSE, CAPILLARY
Glucose-Capillary: 269 mg/dL — ABNORMAL HIGH (ref 70–99)
Glucose-Capillary: 217 mg/dL — ABNORMAL HIGH (ref 70–99)

## 2010-08-20 LAB — CBC
HCT: 40.8 % (ref 39.0–52.0)
Hemoglobin: 15.1 g/dL (ref 13.0–17.0)
MCH: 29.2 pg (ref 26.0–34.0)
MCHC: 37 g/dL — ABNORMAL HIGH (ref 30.0–36.0)
RBC: 5.17 MIL/uL (ref 4.22–5.81)

## 2010-08-20 LAB — POCT I-STAT, CHEM 8
BUN: 9 mg/dL (ref 6–23)
Chloride: 102 mEq/L (ref 96–112)
Creatinine, Ser: 1.4 mg/dL — ABNORMAL HIGH (ref 0.50–1.35)
Potassium: 4 mEq/L (ref 3.5–5.1)
Sodium: 136 mEq/L (ref 135–145)

## 2010-08-20 LAB — LIPASE, BLOOD: Lipase: 8 U/L — ABNORMAL LOW (ref 11–59)

## 2010-08-26 ENCOUNTER — Emergency Department (HOSPITAL_COMMUNITY)
Admission: EM | Admit: 2010-08-26 | Discharge: 2010-08-26 | Disposition: A | Discharge status: Home / Self Care | Payer: Self-pay | Attending: Emergency Medicine | Admitting: Emergency Medicine

## 2010-08-26 DIAGNOSIS — Z794 Long term (current) use of insulin: Secondary | ICD-10-CM | POA: Insufficient documentation

## 2010-08-26 DIAGNOSIS — I10 Essential (primary) hypertension: Secondary | ICD-10-CM | POA: Insufficient documentation

## 2010-08-26 DIAGNOSIS — R1013 Epigastric pain: Secondary | ICD-10-CM | POA: Insufficient documentation

## 2010-08-26 DIAGNOSIS — K2920 Alcoholic gastritis without bleeding: Secondary | ICD-10-CM | POA: Insufficient documentation

## 2010-08-26 DIAGNOSIS — F1010 Alcohol abuse, uncomplicated: Secondary | ICD-10-CM | POA: Insufficient documentation

## 2010-08-26 DIAGNOSIS — G40909 Epilepsy, unspecified, not intractable, without status epilepticus: Secondary | ICD-10-CM | POA: Insufficient documentation

## 2010-08-26 DIAGNOSIS — Z79899 Other long term (current) drug therapy: Secondary | ICD-10-CM | POA: Insufficient documentation

## 2010-08-26 DIAGNOSIS — E119 Type 2 diabetes mellitus without complications: Secondary | ICD-10-CM | POA: Insufficient documentation

## 2010-08-26 LAB — BASIC METABOLIC PANEL
Chloride: 93 mEq/L — ABNORMAL LOW (ref 96–112)
GFR calc Af Amer: >60 mL/min (ref >60)
GFR calc non Af Amer: >60 mL/min (ref >60)
Potassium: 3.6 mEq/L (ref 3.5–5.1)
Sodium: 134 mEq/L — ABNORMAL LOW (ref 135–145)

## 2010-08-26 LAB — DIFFERENTIAL
Basophils Absolute: 0 10*3/uL (ref 0.0–0.1)
Basophils Relative: 1 % (ref 0–1)
Eosinophils Absolute: 0 10*3/uL (ref 0.0–0.7)
Eosinophils Relative: 0 % (ref 0–5)
Lymphocytes Relative: 36 % (ref 12–46)
Monocytes Absolute: 0.4 10*3/uL (ref 0.1–1.0)

## 2010-08-26 LAB — CBC
HCT: 39 % (ref 39.0–52.0)
MCHC: 37.9 g/dL — ABNORMAL HIGH (ref 30.0–36.0)
Platelets: 201 10*3/uL (ref 150–400)
RDW: 12.9 % (ref 11.5–15.5)

## 2010-08-26 LAB — GLUCOSE, CAPILLARY
Glucose-Capillary: 538 mg/dL — ABNORMAL HIGH (ref 70–99)
Glucose-Capillary: 346 mg/dL — ABNORMAL HIGH (ref 70–99)
Glucose-Capillary: 310 mg/dL — ABNORMAL HIGH (ref 70–99)

## 2010-08-26 LAB — URINALYSIS, ROUTINE W REFLEX MICROSCOPIC
Leukocytes, UA: NEGATIVE
Nitrite: NEGATIVE
Protein, ur: NEGATIVE mg/dL
Specific Gravity, Urine: 1.01 (ref 1.005–1.030)
Urobilinogen, UA: 0.2 mg/dL (ref 0.0–1.0)

## 2010-08-26 LAB — URINE MICROSCOPIC-ADD ON

## 2010-09-26 ENCOUNTER — Encounter: Payer: Self-pay | Admitting: Internal Medicine

## 2010-09-26 ENCOUNTER — Emergency Department (HOSPITAL_COMMUNITY): Payer: Self-pay

## 2010-09-26 ENCOUNTER — Inpatient Hospital Stay (HOSPITAL_COMMUNITY)
Admission: EM | Admit: 2010-09-26 | Discharge: 2010-09-28 | DRG: 641 | Disposition: A | Discharge status: Home / Self Care | Payer: Self-pay | Attending: Internal Medicine | Admitting: Internal Medicine

## 2010-09-26 DIAGNOSIS — G40802 Other epilepsy, not intractable, without status epilepticus: Secondary | ICD-10-CM | POA: Diagnosis present

## 2010-09-26 DIAGNOSIS — E872 Acidosis, unspecified: Principal | ICD-10-CM | POA: Diagnosis present

## 2010-09-26 DIAGNOSIS — I10 Essential (primary) hypertension: Secondary | ICD-10-CM | POA: Diagnosis present

## 2010-09-26 DIAGNOSIS — E869 Volume depletion, unspecified: Secondary | ICD-10-CM | POA: Diagnosis present

## 2010-09-26 DIAGNOSIS — E119 Type 2 diabetes mellitus without complications: Secondary | ICD-10-CM | POA: Diagnosis present

## 2010-09-26 DIAGNOSIS — K5900 Constipation, unspecified: Secondary | ICD-10-CM | POA: Diagnosis present

## 2010-09-26 DIAGNOSIS — F1020 Alcohol dependence, uncomplicated: Secondary | ICD-10-CM | POA: Diagnosis present

## 2010-09-26 DIAGNOSIS — R112 Nausea with vomiting, unspecified: Secondary | ICD-10-CM | POA: Diagnosis present

## 2010-09-26 DIAGNOSIS — E781 Pure hyperglyceridemia: Secondary | ICD-10-CM | POA: Diagnosis present

## 2010-09-26 DIAGNOSIS — E873 Alkalosis: Secondary | ICD-10-CM | POA: Diagnosis present

## 2010-09-26 DIAGNOSIS — E876 Hypokalemia: Secondary | ICD-10-CM | POA: Diagnosis present

## 2010-09-26 DIAGNOSIS — R109 Unspecified abdominal pain: Secondary | ICD-10-CM | POA: Diagnosis present

## 2010-09-26 LAB — COMPREHENSIVE METABOLIC PANEL WITH GFR
ALT: 29 U/L (ref 0–53)
AST: 31 U/L (ref 0–37)
Albumin: 4.5 g/dL (ref 3.5–5.2)
Alkaline Phosphatase: 136 U/L — ABNORMAL HIGH (ref 39–117)
BUN: 11 mg/dL (ref 6–23)
Chloride: 87 meq/L — ABNORMAL LOW (ref 96–112)
Potassium: 4.1 meq/L (ref 3.5–5.1)
Sodium: 132 meq/L — ABNORMAL LOW (ref 135–145)
Total Bilirubin: 0.5 mg/dL (ref 0.3–1.2)
Total Protein: 8.8 g/dL — ABNORMAL HIGH (ref 6.0–8.3)

## 2010-09-26 LAB — CBC
HCT: 45.3 % (ref 39.0–52.0)
Hemoglobin: 17.2 g/dL — ABNORMAL HIGH (ref 13.0–17.0)
MCH: 29.8 pg (ref 26.0–34.0)
MCHC: 38 g/dL — ABNORMAL HIGH (ref 30.0–36.0)
MCV: 78.5 fL (ref 78.0–100.0)
Platelets: 267 10*3/uL (ref 150–400)
RBC: 5.77 MIL/uL (ref 4.22–5.81)
RDW: 12.6 % (ref 11.5–15.5)
WBC: 6.4 10*3/uL (ref 4.0–10.5)

## 2010-09-26 LAB — POCT I-STAT 3, VENOUS BLOOD GAS (G3P V)
Acid-base deficit: 3 mmol/L — ABNORMAL HIGH (ref 0.0–2.0)
Bicarbonate: 21.2 meq/L (ref 20.0–24.0)
O2 Saturation: 33 %
TCO2: 22 mmol/L (ref 0–100)
pCO2, Ven: 34.2 mmHg — ABNORMAL LOW (ref 45.0–50.0)
pH, Ven: 7.4 — ABNORMAL HIGH (ref 7.250–7.300)
pO2, Ven: 20 mmHg — CL (ref 30.0–45.0)

## 2010-09-26 LAB — COMPREHENSIVE METABOLIC PANEL
CO2: 18 mEq/L — ABNORMAL LOW (ref 19–32)
Calcium: 10 mg/dL (ref 8.4–10.5)
Creatinine, Ser: 0.88 mg/dL (ref 0.50–1.35)
GFR calc Af Amer: >60 mL/min (ref >60)
GFR calc non Af Amer: >60 mL/min (ref >60)
Glucose, Bld: 333 mg/dL — ABNORMAL HIGH (ref 70–99)

## 2010-09-26 LAB — GLUCOSE, CAPILLARY
Glucose-Capillary: 335 mg/dL — ABNORMAL HIGH (ref 70–99)
Glucose-Capillary: 272 mg/dL — ABNORMAL HIGH (ref 70–99)
Glucose-Capillary: 234 mg/dL — ABNORMAL HIGH (ref 70–99)
Glucose-Capillary: 209 mg/dL — ABNORMAL HIGH (ref 70–99)
Glucose-Capillary: 162 mg/dL — ABNORMAL HIGH (ref 70–99)

## 2010-09-26 LAB — URINALYSIS, ROUTINE W REFLEX MICROSCOPIC
Bilirubin Urine: NEGATIVE
Glucose, UA: >1000 mg/dL — AB
Ketones, ur: >80 mg/dL — AB
Leukocytes, UA: NEGATIVE
Nitrite: NEGATIVE
Protein, ur: 30 mg/dL — AB
Specific Gravity, Urine: 1.037 — ABNORMAL HIGH (ref 1.005–1.030)
Urobilinogen, UA: 0.2 mg/dL (ref 0.0–1.0)
pH: 5.5 (ref 5.0–8.0)

## 2010-09-26 LAB — DIFFERENTIAL
Basophils Absolute: 0.1 10*3/uL (ref 0.0–0.1)
Basophils Relative: 1 % (ref 0–1)
Eosinophils Absolute: 0 10*3/uL (ref 0.0–0.7)
Eosinophils Relative: 0 % (ref 0–5)
Lymphocytes Relative: 31 % (ref 12–46)
Lymphs Abs: 2 K/uL (ref 0.7–4.0)
Monocytes Absolute: 0.4 K/uL (ref 0.1–1.0)
Monocytes Relative: 6 % (ref 3–12)
Neutro Abs: 3.9 10*3/uL (ref 1.7–7.7)
Neutrophils Relative %: 62 % (ref 43–77)

## 2010-09-26 LAB — PHENYTOIN LEVEL, TOTAL: Phenytoin Lvl: <2.5 ug/mL — ABNORMAL LOW (ref 10.0–20.0)

## 2010-09-26 LAB — CK TOTAL AND CKMB (NOT AT ARMC)
CK, MB: 3.9 ng/mL (ref 0.3–4.0)
Relative Index: 0.9 (ref 0.0–2.5)
Total CK: 436 U/L — ABNORMAL HIGH (ref 7–232)

## 2010-09-26 LAB — BASIC METABOLIC PANEL
CO2: 25 mEq/L (ref 19–32)
Chloride: 97 mEq/L (ref 96–112)
GFR calc Af Amer: >60 mL/min (ref >60)
Potassium: 3.5 mEq/L (ref 3.5–5.1)
Sodium: 135 mEq/L (ref 135–145)

## 2010-09-26 LAB — MRSA PCR SCREENING: MRSA by PCR: NEGATIVE

## 2010-09-26 LAB — HEMOGLOBIN A1C
Hgb A1c MFr Bld: 12.5 % — ABNORMAL HIGH (ref <5.7)
Mean Plasma Glucose: 312 mg/dL — ABNORMAL HIGH (ref <117)

## 2010-09-26 LAB — URINE MICROSCOPIC-ADD ON

## 2010-09-26 LAB — LIPASE, BLOOD: Lipase: 9 U/L — ABNORMAL LOW (ref 11–59)

## 2010-09-26 LAB — SALICYLATE LEVEL: Salicylate Lvl: <2 mg/dL — ABNORMAL LOW (ref 2.8–20.0)

## 2010-09-26 LAB — LACTIC ACID, PLASMA: Lactic Acid, Venous: 1.2 mmol/L (ref 0.5–2.2)

## 2010-09-26 LAB — ETHANOL: Alcohol, Ethyl (B): 126 mg/dL — ABNORMAL HIGH (ref 0–11)

## 2010-09-26 LAB — TROPONIN I: Troponin I: <0.3 ng/mL (ref <0.30)

## 2010-09-26 NOTE — H&P (Signed)
Hospital Admission Note Date: 09/26/2010  Patient name: Gabriel Rush Medical record number: [id number] Date of birth: 10-02-1960 Age: 50 y.o. Gender: male PCP: Julieanne Manson, MD  Medical Service: Medicine teaching service.   Attending physician:   Dr. Aundria Rud Resident (R3):   Dr. Gilford Rile   Pager: [phone] Resident (R1):   Dr. Ruben Reason   Pager: [phone]   Chief Complaint: N/V abdominal pain  History of Present Illness: 50 year old AA male with PMH of Alcohol abuse, DM, [place] Seizures, HTN and chronic pancreatitis who present with the CC on nausea, vomit and abdominal pain after binge drinking. After drinking almost 2 and half pints of al vodka in a period 7 hours patient went to sleep. He woke up and 1 hour later with sever abdominal pain, nausea and vomit. He describes the pain as diffuse like constant pain, intensity of 10, located to the mid abdomen that radiates in band like fashion that to both sides of his back. He vomited about 3-4 times, vomiting was projectile, non-bloody. Other associated symptoms include chills and dizziness. Denies mental confusion, chest pain, SOB, diarrhea fever or sick contacts. He also denies consuming other substances like moonshine, antifreeze or drugs. Had similar episode about a year ago that was precipitated also by binge dirking. At that time he was diagnose pancreatitis  Meds: Dilantin 100 mg IV q.8 h.  HCTZ 25 mg Lisinopril 10 mg  Creon  Lantus 40 units Novolog SSI  Allergies: NKDA  Past Medical History:  Recurrent pancreatitis.  Hypertension.  Alcohol abuse.  Seizure disorder. DM-1 DKA  Social History: Currently unemployed. Lives with his mother. Education 12 grade, knows how to read and write.   Substance History: Drinks about a pine and half of vodka 1-3 times a week. Denies drugs. Denies smoking.   Family History:  DM HTN OA Fibromyalgias   Review of Systems: Negative except per HPI.   Physical Exam: Vitals:  T=97.5 ,  BP=143/102, HR=116, RR=24, O2 Sat=97 on RA. General:  alert, well-developed, and cooperative to examination.   Head:  normocephalic and atraumatic.   Eyes:  vision grossly intact, pupils equal, pupils round, pupils reactive to light, no injection and anicteric.   Mouth:  pharynx pink and moist, no erythema, and no exudates.   Neck:  supple, full ROM, no thyromegaly, no JVD, and no carotid bruits.   Lungs:  normal respiratory effort, no accessory muscle use, normal breath sounds, no crackles, and no wheezes. Heart:  Tachycardia , regular rhythm, no murmur, no gallop, and no rub.   Abdomen:  soft,  Diffuse tenderness to palpation.normal bowel sounds, no distention, no guarding, no rebound tenderness, no hepatomegaly, and no splenomegaly.   Msk:  no joint swelling, no joint warmth, and no redness over joints.   Pulses:  2+ DP/PT pulses bilaterally Extremities:  No cyanosis, clubbing, edema Neurologic:  alert & oriented X3, cranial nerves II-XII intact, strength normal in all extremities, sensation intact to light touch, and gait normal.   Skin:  turgor normal and no rashes.   Psych:  Oriented X3, memory intact for recent and remote, normally interactive, good eye contact, not anxious appearing, and not depressed appearing.   Lab results: Basic Metabolic Panel: Recent Labs  [facility] 09/26/10 0608   NA 132*   K 4.1   CL 87*   CO2 18*   GLUCOSE 333*   BUN 11   CREATININE 0.88   CALCIUM 10.0   MG --   PHOS --   Liver  Function Tests: Recent Labs  [facility] 09/26/10 0608   AST 31   ALT 29   ALKPHOS 136*   BILITOT 0.5   PROT 8.8*   ALBUMIN 4.5   Recent Labs  [facility] 09/26/10 0608   LIPASE 9*   AMYLASE --   CBC: Recent Labs  [facility] 09/26/10 0608   WBC 6.4   NEUTROABS 3.9   HGB 17.2*   HCT 45.3   MCV 78.5   PLT 267   CBG: Recent Labs  [facility] 09/26/10 0853 09/26/10 0724 09/26/10 0535   GLUCAP 234* 272* 335*   Urine Drug  Screen: Pending  Urinalysis: Findings  Result Name                              Result     Abnl   Normal Range     Units      Perf. Loc.  Color, Urine                             YELLOW            YELLOW  Appearance                               CLEAR             CLEAR  Specific Gravity                         1.037      h      1.005-1.030  pH                                       5.5               5.0-8.0  Urine Glucose                            >1000      a      NEG              mg/dL  Bilirubin                                NEGATIVE          NEG  Ketones                                  >80        a      NEG              mg/dL  Blood                                    TRACE      a      NEG  Protein  30         a      NEG              mg/dL  Urobilinogen                             0.2               0.0-1.0          mg/dL  Nitrite                                  NEGATIVE          NEG  Leukocytes                               NEGATIVE          NEG  Findings  Result Name                              Result     Abnl   Normal Range     Units      Perf. Loc.  Squamous Epithelial / LPF                RARE              RARE     Imaging results:  CXR: Pending.   Assessment & Plan by Problem:  Ketoacidosis: Patient is clinically stable, CBG's 333 on admission with AG 27, with delta gap of 15 and delta delta ration of 2.5, with history of alcoholism with recent alcoholic binge yesterday, his current alcohol level is 126, No recent steroid use to explain hyperglycemia, denies methanol or ethylene glycol abuse to explain the high gap, Will r/o infecious causes such as PNA, UTI also rule out ischemic/inflammatory conditions (MI, Pancreatitis) though unlikely given presentation and negative lipase.  Plan: Will admit to SDU Will check HgA1c, CEx1, EKG, Lipase, CXR, methanol, ethylene glycol level, serum osmolality, salicylates level, UDS.   Will treat  patient's ketoacidosis with saline IV fluids, dextrose and thiamine, Will check blood glucose every 2 hours , will start home insulin regiment with sliding scale coverage.  Abdominal Pain: Most likely due to vomiting. Normal lipase and an liver enzymes. Therefore least likely alcohol induce pancreatitis or hepatitis. Plan: Fasting lipids to r/u triglyceride induce pancreatitis  Will consider reordering lipase and LFT,s in patient complicates. Protonix to prevent stress induce ulcer Zofran to control vomits  Morphine  IV  Q4h PRN for pain management   Volume depletion: Patient tachycardia, elevated HBG. Most likely precipitated by vomiting and alcohol induce diuresis. Plan  Fluid replacement 2 liters bolus on NS, then D5NS a at 200 cc/hr  EKG to r/u other causes of his tachycardia zofran to control vomits    Chronic Pancreatitis: lipase normal on admission. Plan   Will continue pancreatic enzyme replacement with meals and snacks.  DM: Poorly control last HgA1c 13.3 in September 2011 home levels in the 200, restart home Lantuss and SSI, monitor CBGs   HTN: BP elevated to  143/102. Most likely a combination of essential HTN and volume depletion. Continue home meds, lisinopril, hold HCTZ.      R3______________________________      ATTENDING: I performed  and/or observed a history and physical examination of the patient.  I discussed the case with the residents as noted and reviewed the residents' notes.  I agree with the findings and plan--please refer to the attending physician note for more details.  Signature________________________________  Printed Name_____________________________

## 2010-09-27 LAB — LIPID PANEL
Cholesterol: 319 mg/dL — ABNORMAL HIGH (ref 0–200)
HDL: 45 mg/dL (ref >39)
LDL Cholesterol: UNDETERMINED mg/dL (ref 0–99)
Total CHOL/HDL Ratio: 7.1 RATIO
Triglycerides: 430 mg/dL — ABNORMAL HIGH (ref <150)
VLDL: UNDETERMINED mg/dL (ref 0–40)

## 2010-09-27 LAB — CBC
HCT: 36.7 % — ABNORMAL LOW (ref 39.0–52.0)
Hemoglobin: 13.3 g/dL (ref 13.0–17.0)
MCHC: 36.2 g/dL — ABNORMAL HIGH (ref 30.0–36.0)
RBC: 4.62 MIL/uL (ref 4.22–5.81)
WBC: 4.5 10*3/uL (ref 4.0–10.5)

## 2010-09-27 LAB — BASIC METABOLIC PANEL
BUN: 6 mg/dL (ref 6–23)
Calcium: 8.8 mg/dL (ref 8.4–10.5)
Chloride: 97 mEq/L (ref 96–112)
Creatinine, Ser: 0.63 mg/dL (ref 0.50–1.35)
GFR calc Af Amer: >60 mL/min (ref >60)

## 2010-09-27 LAB — GLUCOSE, CAPILLARY
Glucose-Capillary: 281 mg/dL — ABNORMAL HIGH (ref 70–99)
Glucose-Capillary: 211 mg/dL — ABNORMAL HIGH (ref 70–99)

## 2010-09-28 LAB — BASIC METABOLIC PANEL
BUN: 8 mg/dL (ref 6–23)
Calcium: 9.3 mg/dL (ref 8.4–10.5)
GFR calc non Af Amer: >60 mL/min (ref >60)
Glucose, Bld: 241 mg/dL — ABNORMAL HIGH (ref 70–99)
Sodium: 134 mEq/L — ABNORMAL LOW (ref 135–145)

## 2010-09-28 LAB — CBC
HCT: 37.7 % — ABNORMAL LOW (ref 39.0–52.0)
Hemoglobin: 13.4 g/dL (ref 13.0–17.0)
MCH: 28.5 pg (ref 26.0–34.0)
MCHC: 35.5 g/dL (ref 30.0–36.0)
RDW: 12.7 % (ref 11.5–15.5)

## 2010-10-02 ENCOUNTER — Emergency Department (HOSPITAL_COMMUNITY)
Admission: EM | Admit: 2010-10-02 | Discharge: 2010-10-02 | Disposition: A | Discharge status: Home / Self Care | Payer: Self-pay | Attending: Emergency Medicine | Admitting: Emergency Medicine

## 2010-10-02 ENCOUNTER — Emergency Department (HOSPITAL_COMMUNITY): Payer: Self-pay

## 2010-10-02 DIAGNOSIS — R1013 Epigastric pain: Secondary | ICD-10-CM | POA: Insufficient documentation

## 2010-10-02 DIAGNOSIS — G40909 Epilepsy, unspecified, not intractable, without status epilepticus: Secondary | ICD-10-CM | POA: Insufficient documentation

## 2010-10-02 DIAGNOSIS — I10 Essential (primary) hypertension: Secondary | ICD-10-CM | POA: Insufficient documentation

## 2010-10-02 DIAGNOSIS — K2970 Gastritis, unspecified, without bleeding: Secondary | ICD-10-CM | POA: Insufficient documentation

## 2010-10-02 DIAGNOSIS — E119 Type 2 diabetes mellitus without complications: Secondary | ICD-10-CM | POA: Insufficient documentation

## 2010-10-02 DIAGNOSIS — F1010 Alcohol abuse, uncomplicated: Secondary | ICD-10-CM | POA: Insufficient documentation

## 2010-10-02 DIAGNOSIS — R112 Nausea with vomiting, unspecified: Secondary | ICD-10-CM | POA: Insufficient documentation

## 2010-10-02 LAB — DIFFERENTIAL
Lymphocytes Relative: 51 % — ABNORMAL HIGH (ref 12–46)
Monocytes Absolute: 0.3 10*3/uL (ref 0.1–1.0)
Monocytes Relative: 4 % (ref 3–12)
Neutro Abs: 3.1 10*3/uL (ref 1.7–7.7)

## 2010-10-02 LAB — CBC
HCT: 40.6 % (ref 39.0–52.0)
Hemoglobin: 14.8 g/dL (ref 13.0–17.0)
MCH: 29.5 pg (ref 26.0–34.0)
MCHC: 36.5 g/dL — ABNORMAL HIGH (ref 30.0–36.0)
MCV: 80.9 fL (ref 78.0–100.0)

## 2010-10-02 LAB — COMPREHENSIVE METABOLIC PANEL
ALT: 28 U/L (ref 0–53)
CO2: 27 mEq/L (ref 19–32)
Calcium: 9.2 mg/dL (ref 8.4–10.5)
GFR calc Af Amer: >60 mL/min (ref >60)
GFR calc non Af Amer: >60 mL/min (ref >60)
Glucose, Bld: 389 mg/dL — ABNORMAL HIGH (ref 70–99)
Sodium: 136 mEq/L (ref 135–145)

## 2010-10-02 LAB — GLUCOSE, CAPILLARY: Glucose-Capillary: 328 mg/dL — ABNORMAL HIGH (ref 70–99)

## 2010-10-02 LAB — POCT I-STAT, CHEM 8
Creatinine, Ser: 1.4 mg/dL — ABNORMAL HIGH (ref 0.50–1.35)
Glucose, Bld: 393 mg/dL — ABNORMAL HIGH (ref 70–99)
Hemoglobin: 15.3 g/dL (ref 13.0–17.0)
Potassium: 3.9 mEq/L (ref 3.5–5.1)
TCO2: 26 mmol/L (ref 0–100)

## 2010-10-04 NOTE — Discharge Summary (Signed)
Gabriel Rush, Gabriel Rush             ACCOUNT NO.:  [id number]  MEDICAL RECORD NO.:  [id number]  LOCATION:  5030                         FACILITY:  [facility]  PHYSICIAN:  C. Ulyess Mort, M.D.DATE OF BIRTH:  December 14, 1960  DATE OF ADMISSION:  09/26/2010 DATE OF DISCHARGE:  09/28/2010                              DISCHARGE SUMMARY   DISCHARGE DIAGNOSES: 1. Alcoholic ketoacidosis. 2. Abdominal pain. 3. Volume depletion. 4. Tachycardia. 5. Diabetes mellitus. 6. Hyperkalemia. 7. Hypertension. 8. Hypertriglyceridemia. 9. Alcohol abuse.  DISCHARGE MEDICATIONS: 1. Dulcolax 5 mg by mouth daily as needed. 2. Insulin glargine injection 45 units subcutaneous daily. 3. Lisinopril 40 mg one tablet by mouth daily. 4. Creon 24 one capsule by mouth 3 times a day with meals. 5. Gemfibrozil 600 mg 2 tablets by mouth b.i.d. 6. Multivitamins one tablet by mouth daily. 7. NovoLog 18-24 units subcutaneously 3 times a day before meals. 8. Percocet 5/325 mg one tablet by mouth every 6 hours as needed. 9. Phenytoin extended-release 100 mg 3 capsules by mouth daily at     bedtime. 10.Protonix one tablet by mouth daily.  DISPOSITION AND FOLLOWUP:  Gabriel Rush. was discharged from [facility] where he received treatment for alcoholic ketoacidosis.  At the time of discharge, the patient was stable and all laboratory data were within normal limits.  At the time of  follow-up with Dr. Philipp Deputy on October 27, 2010, the patient's diabetic medical regimen should be evaluated.  If home sugar level still elevated, his insulin regimen should be increased.  Additionally, a fasting lipid panel should be ordered to evaluate  for elevated LDL.  The patient might benefit from statin therapy.  Also, hemoglobin A1c should be ordered in 3 months to evaluate the diabetic control.  Alcohol cessation counseling should be continued in the outpatient setting.   PROCEDURES PERFORMED:  There were no procedures during the hospital  stay.  CONSULTATIONS:  There were no consultations during the hospital stay. BRIEF HISTORY AND PHYSICAL EXAM:  Gabriel Rush is a 50 year old African American male with past medical history of alcohol abuse, diabetes, seizures, hypertension, and chronic pancreatitis who presented with the chief complaint of nausea, vomit, and abdominal  pain after binge drinking.  He drank almost 2-1/2 pints of vodka in period of 7 hours. The patient later went to sleep and he woke up an hour later with severe abdominal pain, nausea, and vomit.  He described the pain as a diffuse- like constant pain,  intensity of 10, located to the mid-abdomen that radiated bilaterally in a band-like fashion to both sides of his back. He vomited about 3-4 times during the night, vomit was projectile and nonbloody.  Other associated symptoms included chills and  dizziness. Denied mental confusion, chest pain, shortness of breath, diarrhea, fever, or sick contacts.  He also denied consuming other substances like moonshines, anti-freeze, or drugs.  Had similar episodes in the past that were precipitated also by  binge drinking.  At previous episodes, he was treated for pancreatitis.  PHYSICAL EXAMINATION ON ADMISSION:   VITAL SIGNS:  Temperature of 97.5, blood pressure 143/102, heart rate 116, respiratory rate 24, oxygen saturation 97 on room air.  GENERAL:  The patient  was an alert, obese male, was cooperative to examination, but looking pink.  EYES:  The vision was grossly intact.  Pupils equally reactive to light. No signs of jaundice.  MOUTH:  Moist mucous membranes.  No erythema.  No exudates.  LUNGS:  Clear to auscultation bilaterally.  HEART:  Tachycardia of 109, but regular rhythm.  No murmurs, no gallops, no rubs.  ABDOMEN:  Soft, but diffusely tender to palpation.  Normal bowel sounds. No distention, no guarding, and no rebound tenderness.  No hepatosplenomegaly.  NEUROLOGIC:  The patient was alert and oriented x3.  Cranial nerves  were intact.  SKIN:  Had normal turgor and no rashes.   ADMISSION LABS:  Sodium of 132, potassium 4.1, chloride 87, CO2 of 18, glucose 333, BUN 11, creatinine 0.88, calcium 10, anion gap 27, delta delta ratio 2.1.  AST 31, ALT 29, alkaline phosphatase 136, total bilirubin 0.5, protein 8.8, albumin 4.5, lipase 9.  White blood cells were 6.4, hemoglobin 17.2, hematocrit 45.3, platelets 267.  Urinalysis was positive for ketones that were more than 80.  Alcohol level was 126. Chest x-ray finding, heart size was normal.  The lungs are clear, focal consolidation and pleural effusion.  No evidence of pulmonary edema.   HOSPITAL COURSE BY PROBLEM:  1. Alcoholic ketoacidosis.  Bicarbonate was 18, anion gap was calculated to be 27, delta delta ratio 2.1, decreased chloride, positive ketones in urine and n alcohol level 126.  Most likely represents a metabolic acidosis produced by alcohol inhibition  of gluconeogenesis.  Therefore, shifting the energy production to fatty acids and ketones.  Additionally, hypochloremic metabolic alkalosis most likely caused by vomiting and contraction alkalosis. The patient was given IV fluids, thiamine, and sugar.   Other possible causes of metabolic acidosis were ruled out.  Results came back negative for lactic acid, salicylate. Urine drug screen was also negative.  By 5 p.m. of the day of admission, the patient's anion gap was corrected.  His fluids were  decreased to one half D5 normal saline at 75 mL per hour and ware eventually discontinued after the patient was able to tolerate p.o. intake.  2. Abdominal pain, pancreatic and liver enzymes negative.  Most likely due to the straining caused by vomiting.  Vomiting was controlled by Zofran and pantoprazole was given to prevent stress-induced ulcer. Additionally, morphine was given to decreased  pain.  Lipase and liver enzymes were reordered because in alcoholic patients with chronic pancreatitis or hepatitis enzymes might  take a longer time to peak.  3. Volume depletion.  The patient had tachycardia and elevated hemoglobin.  The patient was given 2 L of normal saline, then was changed to D5 normal saline at 200 mL per hour.  Once, anion gap was corrected, the rate of IV fluids was changed to 75 mL  per hour and later, discontinue once the patient tolerated p.o. intake.  He was given Zofran to control vomit.  4. Tachycardia most likely due to his volume depletion.  However, an EKG was ordered to evaluate for alcohol-induced atrial fibrillation or atrial flutter.  EKG showed normal sinus rhythm with no evidence of arrhythmia.  Tachycardia corrected once volume  depletion was corrected.  5.  Diabetes mellitus.  Capillary blood sugars 333.  Hemoglobin A1c 13.1 in September 2011, which show evidence of fairly controlled diabetes mellitus. The patient was started on sliding scale insulin and Lantus 40 units.  Another hemoglobin A1c was  ordered with and fasting lipids. Due to constant elevated capillary  blood glucose, the patient's Lantus was increased to 45 units before discharge.  We will consider adjusting his regimen on an outpatient setting.  Should also consider reordering fasting  lipids due to the fact that LDL could not be calculated due to increased elevated triglycerides.  6. Hypokalemia.  After fluids and insulin therapy, the patient's potassium decreased to 2.1.  It was replaced by p.o. potassium at discharge.  The patient's potassium level was increased to 3.9.  7. Hypertension.  Blood pressure on admission was 142/102.  Most likely a combination of his essential hypertension and volume depletion.  After volume depletion was corrected.  Lisinopril was increased to 40 mg daily.  8. Constipation.  Most likely narcotic-induced constipation.  The patient was given Dulcolax to prevent and treat the constipation.   9. Hypertriglyceridemia most likely due to his ketoacidosis.  We will continue Gemfibrozil.  We will  recommend reordering fasting lipids to evaluate for improvement or resolution.  10. Alcoholism.  The patient admits drinking half a pint of vodka 1-3 times a week.  The patient was started on CIWA protocol.  Alcohol cessation counseling was ordered.  We will recommend continuing alcohol cessation encouragement in the Outpatient  Clinic.  11. Other problems, chronic pancreatitis and seizures.  Home meds were continued and no complications during his hospital stay. No change to his current medical regiment other than the ones mentioned during the hospital course.   DISCHARGE VITALS AND LABS:  Temperature 98, pulse 74, respiratory rate 19, systolic blood pressure 135, diastolic blood pressure 92, oxygen saturation 99.  White blood cells 4.2, hemoglobin 13.4, hematocrit 37.7, platelets 168. Sodium 134, potassium 3.9, chloride 100, CO2 of 26, glucose 241, BUN 8, creatinine 0.78.     Darnelle Maffucci, MD   ______________________________ C. Ulyess Mort, M.D.    PT/MEDQ  D:  09/28/2010  T:  09/29/2010  Job:  [id number]  cc:   Tresa Endo L. Philipp Deputy, M.D.  Electronically Signed by Darnelle Maffucci  on 09/30/2010 12:10:34 PM Electronically Signed by Eliezer Lofts M.D. on 10/04/2010 08:46:37 AM

## 2010-10-05 ENCOUNTER — Emergency Department (HOSPITAL_COMMUNITY)
Admission: EM | Admit: 2010-10-05 | Discharge: 2010-10-06 | Disposition: A | Discharge status: Home / Self Care | Payer: Self-pay | Attending: Emergency Medicine | Admitting: Emergency Medicine

## 2010-10-05 ENCOUNTER — Emergency Department (HOSPITAL_COMMUNITY)
Admission: EM | Admit: 2010-10-05 | Discharge: 2010-10-05 | Disposition: A | Discharge status: Home / Self Care | Payer: Self-pay | Attending: Emergency Medicine | Admitting: Emergency Medicine

## 2010-10-05 DIAGNOSIS — E119 Type 2 diabetes mellitus without complications: Secondary | ICD-10-CM | POA: Insufficient documentation

## 2010-10-05 DIAGNOSIS — R10819 Abdominal tenderness, unspecified site: Secondary | ICD-10-CM | POA: Insufficient documentation

## 2010-10-05 DIAGNOSIS — R109 Unspecified abdominal pain: Secondary | ICD-10-CM | POA: Insufficient documentation

## 2010-10-05 DIAGNOSIS — R000 Tachycardia, unspecified: Secondary | ICD-10-CM | POA: Insufficient documentation

## 2010-10-05 DIAGNOSIS — I10 Essential (primary) hypertension: Secondary | ICD-10-CM | POA: Insufficient documentation

## 2010-10-05 DIAGNOSIS — F1010 Alcohol abuse, uncomplicated: Secondary | ICD-10-CM | POA: Insufficient documentation

## 2010-10-05 DIAGNOSIS — R112 Nausea with vomiting, unspecified: Secondary | ICD-10-CM | POA: Insufficient documentation

## 2010-10-05 LAB — GLUCOSE, CAPILLARY: Glucose-Capillary: 281 mg/dL — ABNORMAL HIGH (ref 70–99)

## 2010-10-08 ENCOUNTER — Emergency Department (HOSPITAL_COMMUNITY)
Admission: EM | Admit: 2010-10-08 | Discharge: 2010-10-08 | Disposition: A | Discharge status: Home / Self Care | Payer: Self-pay | Attending: Emergency Medicine | Admitting: Emergency Medicine

## 2010-10-08 DIAGNOSIS — F209 Schizophrenia, unspecified: Secondary | ICD-10-CM | POA: Insufficient documentation

## 2010-10-08 DIAGNOSIS — F411 Generalized anxiety disorder: Secondary | ICD-10-CM | POA: Insufficient documentation

## 2010-10-08 DIAGNOSIS — IMO0002 Reserved for concepts with insufficient information to code with codable children: Secondary | ICD-10-CM | POA: Insufficient documentation

## 2010-10-08 DIAGNOSIS — R000 Tachycardia, unspecified: Secondary | ICD-10-CM | POA: Insufficient documentation

## 2010-10-08 LAB — DIFFERENTIAL
Basophils Absolute: 0.1 10*3/uL (ref 0.0–0.1)
Eosinophils Relative: 3 % (ref 0–5)
Lymphocytes Relative: 57 % — ABNORMAL HIGH (ref 12–46)
Monocytes Absolute: 0.2 10*3/uL (ref 0.1–1.0)
Monocytes Relative: 4 % (ref 3–12)
Neutro Abs: 1.7 10*3/uL (ref 1.7–7.7)

## 2010-10-08 LAB — CBC
HCT: 39.4 % (ref 39.0–52.0)
Hemoglobin: 14.2 g/dL (ref 13.0–17.0)
MCH: 29.3 pg (ref 26.0–34.0)
MCHC: 36 g/dL (ref 30.0–36.0)
RDW: 12.6 % (ref 11.5–15.5)

## 2010-10-08 LAB — GLUCOSE, CAPILLARY
Glucose-Capillary: 483 mg/dL — ABNORMAL HIGH (ref 70–99)
Glucose-Capillary: 258 mg/dL — ABNORMAL HIGH (ref 70–99)
Glucose-Capillary: 248 mg/dL — ABNORMAL HIGH (ref 70–99)

## 2010-10-08 LAB — COMPREHENSIVE METABOLIC PANEL
ALT: 28 U/L (ref 0–53)
AST: 30 U/L (ref 0–37)
Alkaline Phosphatase: 117 U/L (ref 39–117)
GFR calc non Af Amer: >60 mL/min (ref >60)
Glucose, Bld: 441 mg/dL — ABNORMAL HIGH (ref 70–99)
Total Bilirubin: 0.2 mg/dL — ABNORMAL LOW (ref 0.3–1.2)

## 2010-10-22 ENCOUNTER — Emergency Department (HOSPITAL_COMMUNITY)
Admission: EM | Admit: 2010-10-22 | Discharge: 2010-10-22 | Disposition: A | Discharge status: Home / Self Care | Payer: Self-pay | Attending: Emergency Medicine | Admitting: Emergency Medicine

## 2010-10-22 DIAGNOSIS — I10 Essential (primary) hypertension: Secondary | ICD-10-CM | POA: Insufficient documentation

## 2010-10-22 DIAGNOSIS — F1010 Alcohol abuse, uncomplicated: Secondary | ICD-10-CM | POA: Insufficient documentation

## 2010-10-22 DIAGNOSIS — E119 Type 2 diabetes mellitus without complications: Secondary | ICD-10-CM | POA: Insufficient documentation

## 2010-10-22 DIAGNOSIS — L259 Unspecified contact dermatitis, unspecified cause: Secondary | ICD-10-CM | POA: Insufficient documentation

## 2010-10-22 DIAGNOSIS — R112 Nausea with vomiting, unspecified: Secondary | ICD-10-CM | POA: Insufficient documentation

## 2010-10-22 DIAGNOSIS — Z79899 Other long term (current) drug therapy: Secondary | ICD-10-CM | POA: Insufficient documentation

## 2010-10-22 DIAGNOSIS — R1013 Epigastric pain: Secondary | ICD-10-CM | POA: Insufficient documentation

## 2010-10-22 DIAGNOSIS — R197 Diarrhea, unspecified: Secondary | ICD-10-CM | POA: Insufficient documentation

## 2010-10-22 LAB — COMPREHENSIVE METABOLIC PANEL
Alkaline Phosphatase: 108 U/L (ref 39–117)
BUN: 9 mg/dL (ref 6–23)
CO2: 27 mEq/L (ref 19–32)
GFR calc Af Amer: >60 mL/min (ref >60)
GFR calc non Af Amer: >60 mL/min (ref >60)
Glucose, Bld: 319 mg/dL — ABNORMAL HIGH (ref 70–99)
Potassium: 3.9 mEq/L (ref 3.5–5.1)
Total Protein: 8 g/dL (ref 6.0–8.3)

## 2010-10-22 LAB — DIFFERENTIAL
Basophils Absolute: 0.1 10*3/uL (ref 0.0–0.1)
Lymphocytes Relative: 59 % — ABNORMAL HIGH (ref 12–46)
Monocytes Absolute: 0.2 10*3/uL (ref 0.1–1.0)
Monocytes Relative: 4 % (ref 3–12)
Neutro Abs: 1.9 10*3/uL (ref 1.7–7.7)

## 2010-10-22 LAB — CBC
HCT: 39.2 % (ref 39.0–52.0)
Hemoglobin: 14.2 g/dL (ref 13.0–17.0)
MCH: 29.5 pg (ref 26.0–34.0)
MCHC: 36.2 g/dL — ABNORMAL HIGH (ref 30.0–36.0)
RBC: 4.81 MIL/uL (ref 4.22–5.81)

## 2010-10-22 LAB — ETHANOL: Alcohol, Ethyl (B): 342 mg/dL — ABNORMAL HIGH (ref 0–11)

## 2010-10-23 ENCOUNTER — Emergency Department (HOSPITAL_COMMUNITY): Payer: Self-pay

## 2010-10-23 ENCOUNTER — Emergency Department (HOSPITAL_COMMUNITY)
Admission: EM | Admit: 2010-10-23 | Discharge: 2010-10-23 | Disposition: A | Discharge status: Home / Self Care | Payer: Self-pay | Attending: Emergency Medicine | Admitting: Emergency Medicine

## 2010-10-23 DIAGNOSIS — R079 Chest pain, unspecified: Secondary | ICD-10-CM | POA: Insufficient documentation

## 2010-10-23 DIAGNOSIS — I10 Essential (primary) hypertension: Secondary | ICD-10-CM | POA: Insufficient documentation

## 2010-10-23 DIAGNOSIS — R1011 Right upper quadrant pain: Secondary | ICD-10-CM | POA: Insufficient documentation

## 2010-10-23 DIAGNOSIS — E119 Type 2 diabetes mellitus without complications: Secondary | ICD-10-CM | POA: Insufficient documentation

## 2010-10-23 DIAGNOSIS — Z794 Long term (current) use of insulin: Secondary | ICD-10-CM | POA: Insufficient documentation

## 2010-10-23 DIAGNOSIS — F1010 Alcohol abuse, uncomplicated: Secondary | ICD-10-CM | POA: Insufficient documentation

## 2010-10-23 LAB — URINALYSIS, ROUTINE W REFLEX MICROSCOPIC
Bilirubin Urine: NEGATIVE
Ketones, ur: NEGATIVE mg/dL
Leukocytes, UA: NEGATIVE
Nitrite: NEGATIVE
Protein, ur: NEGATIVE mg/dL
Urobilinogen, UA: 0.2 mg/dL (ref 0.0–1.0)

## 2010-10-23 LAB — DIFFERENTIAL
Basophils Relative: 1 % (ref 0–1)
Eosinophils Absolute: 0.2 10*3/uL (ref 0.0–0.7)
Eosinophils Relative: 3 % (ref 0–5)
Lymphs Abs: 2.9 10*3/uL (ref 0.7–4.0)
Monocytes Absolute: 0.3 10*3/uL (ref 0.1–1.0)
Monocytes Relative: 4 % (ref 3–12)
Neutrophils Relative %: 45 % (ref 43–77)

## 2010-10-23 LAB — COMPREHENSIVE METABOLIC PANEL
Albumin: 3.5 g/dL (ref 3.5–5.2)
Alkaline Phosphatase: 118 U/L — ABNORMAL HIGH (ref 39–117)
BUN: 7 mg/dL (ref 6–23)
Creatinine, Ser: 0.86 mg/dL (ref 0.50–1.35)
GFR calc Af Amer: >60 mL/min (ref >60)
Glucose, Bld: 310 mg/dL — ABNORMAL HIGH (ref 70–99)
Potassium: 4 mEq/L (ref 3.5–5.1)
Total Protein: 7.7 g/dL (ref 6.0–8.3)

## 2010-10-23 LAB — CBC
MCH: 28.8 pg (ref 26.0–34.0)
MCHC: 34.6 g/dL (ref 30.0–36.0)
MCV: 83.1 fL (ref 78.0–100.0)
Platelets: 335 10*3/uL (ref 150–400)
RBC: 4.62 MIL/uL (ref 4.22–5.81)
RDW: 12.7 % (ref 11.5–15.5)

## 2010-10-23 LAB — LIPASE, BLOOD: Lipase: 9 U/L — ABNORMAL LOW (ref 11–59)

## 2010-10-23 MED ORDER — IOHEXOL 300 MG/ML  SOLN
100.0000 mL | Freq: Once | INTRAMUSCULAR | Status: AC | PRN
  Administered 2010-10-23: 100 mL via INTRAVENOUS

## 2010-10-29 ENCOUNTER — Emergency Department (HOSPITAL_COMMUNITY)
Admission: EM | Admit: 2010-10-29 | Discharge: 2010-10-29 | Disposition: A | Discharge status: Home / Self Care | Payer: Self-pay | Attending: Emergency Medicine | Admitting: Emergency Medicine

## 2010-10-29 DIAGNOSIS — I10 Essential (primary) hypertension: Secondary | ICD-10-CM | POA: Insufficient documentation

## 2010-10-29 DIAGNOSIS — E119 Type 2 diabetes mellitus without complications: Secondary | ICD-10-CM | POA: Insufficient documentation

## 2010-10-29 DIAGNOSIS — R10812 Left upper quadrant abdominal tenderness: Secondary | ICD-10-CM | POA: Insufficient documentation

## 2010-10-29 DIAGNOSIS — Z794 Long term (current) use of insulin: Secondary | ICD-10-CM | POA: Insufficient documentation

## 2010-10-29 DIAGNOSIS — R1012 Left upper quadrant pain: Secondary | ICD-10-CM | POA: Insufficient documentation

## 2010-10-29 DIAGNOSIS — R110 Nausea: Secondary | ICD-10-CM | POA: Insufficient documentation

## 2010-10-29 DIAGNOSIS — F1010 Alcohol abuse, uncomplicated: Secondary | ICD-10-CM | POA: Insufficient documentation

## 2010-10-29 DIAGNOSIS — Z79899 Other long term (current) drug therapy: Secondary | ICD-10-CM | POA: Insufficient documentation

## 2010-10-29 LAB — DIFFERENTIAL
Basophils Absolute: 0 10*3/uL (ref 0.0–0.1)
Basophils Relative: 0 % (ref 0–1)
Eosinophils Absolute: 0 10*3/uL (ref 0.0–0.7)
Monocytes Absolute: 0.2 10*3/uL (ref 0.1–1.0)
Neutro Abs: 5.4 10*3/uL (ref 1.7–7.7)
Neutrophils Relative %: 61 % (ref 43–77)

## 2010-10-29 LAB — COMPREHENSIVE METABOLIC PANEL
ALT: 34 U/L (ref 0–53)
AST: 25 U/L (ref 0–37)
Albumin: 3.8 g/dL (ref 3.5–5.2)
Alkaline Phosphatase: 101 U/L (ref 39–117)
Calcium: 9 mg/dL (ref 8.4–10.5)
GFR calc Af Amer: >60 mL/min (ref >60)
Glucose, Bld: 278 mg/dL — ABNORMAL HIGH (ref 70–99)
Potassium: 3.2 mEq/L — ABNORMAL LOW (ref 3.5–5.1)
Sodium: 137 mEq/L (ref 135–145)
Total Protein: 7.8 g/dL (ref 6.0–8.3)

## 2010-10-29 LAB — CBC
Hemoglobin: 14.1 g/dL (ref 13.0–17.0)
MCHC: 36.4 g/dL — ABNORMAL HIGH (ref 30.0–36.0)
Platelets: 258 10*3/uL (ref 150–400)

## 2010-11-02 ENCOUNTER — Emergency Department (HOSPITAL_COMMUNITY)
Admission: EM | Admit: 2010-11-02 | Discharge: 2010-11-02 | Disposition: A | Discharge status: Home / Self Care | Payer: Self-pay | Attending: Emergency Medicine | Admitting: Emergency Medicine

## 2010-11-02 DIAGNOSIS — I10 Essential (primary) hypertension: Secondary | ICD-10-CM | POA: Insufficient documentation

## 2010-11-02 DIAGNOSIS — F1010 Alcohol abuse, uncomplicated: Secondary | ICD-10-CM | POA: Insufficient documentation

## 2010-11-02 DIAGNOSIS — E119 Type 2 diabetes mellitus without complications: Secondary | ICD-10-CM | POA: Insufficient documentation

## 2010-11-02 DIAGNOSIS — Z794 Long term (current) use of insulin: Secondary | ICD-10-CM | POA: Insufficient documentation

## 2010-11-02 DIAGNOSIS — R109 Unspecified abdominal pain: Secondary | ICD-10-CM | POA: Insufficient documentation

## 2010-11-02 LAB — POCT I-STAT, CHEM 8
BUN: 13 mg/dL (ref 6–23)
Calcium, Ion: 1.04 mmol/L — ABNORMAL LOW (ref 1.12–1.32)
Chloride: 104 mEq/L (ref 96–112)
Creatinine, Ser: 1.2 mg/dL (ref 0.50–1.35)
Glucose, Bld: 338 mg/dL — ABNORMAL HIGH (ref 70–99)
Potassium: 3.6 mEq/L (ref 3.5–5.1)

## 2010-11-02 LAB — CBC
HCT: 39.2 % (ref 39.0–52.0)
MCH: 29.7 pg (ref 26.0–34.0)
MCV: 81.3 fL (ref 78.0–100.0)
Platelets: 258 10*3/uL (ref 150–400)
RDW: 12.5 % (ref 11.5–15.5)
WBC: 7.2 10*3/uL (ref 4.0–10.5)

## 2010-11-02 LAB — HEPATIC FUNCTION PANEL
ALT: 30 U/L (ref 0–53)
AST: 35 U/L (ref 0–37)
Bilirubin, Direct: 0.1 mg/dL (ref 0.0–0.3)
Indirect Bilirubin: 0.1 mg/dL — ABNORMAL LOW (ref 0.3–0.9)
Total Bilirubin: 0.2 mg/dL — ABNORMAL LOW (ref 0.3–1.2)

## 2010-11-02 LAB — DIFFERENTIAL
Eosinophils Absolute: 0 10*3/uL (ref 0.0–0.7)
Eosinophils Relative: 0 % (ref 0–5)
Lymphocytes Relative: 47 % — ABNORMAL HIGH (ref 12–46)
Lymphs Abs: 3.4 10*3/uL (ref 0.7–4.0)
Monocytes Absolute: 0.4 10*3/uL (ref 0.1–1.0)
Monocytes Relative: 5 % (ref 3–12)

## 2010-11-10 ENCOUNTER — Inpatient Hospital Stay (HOSPITAL_COMMUNITY)
Admission: EM | Admit: 2010-11-10 | Discharge: 2010-11-12 | DRG: 378 | Disposition: A | Discharge status: Home / Self Care | Payer: Self-pay | Attending: Internal Medicine | Admitting: Internal Medicine

## 2010-11-10 DIAGNOSIS — R1013 Epigastric pain: Secondary | ICD-10-CM

## 2010-11-10 DIAGNOSIS — R112 Nausea with vomiting, unspecified: Secondary | ICD-10-CM | POA: Diagnosis present

## 2010-11-10 DIAGNOSIS — IMO0001 Reserved for inherently not codable concepts without codable children: Secondary | ICD-10-CM | POA: Diagnosis present

## 2010-11-10 DIAGNOSIS — K920 Hematemesis: Secondary | ICD-10-CM

## 2010-11-10 DIAGNOSIS — K2921 Alcoholic gastritis with bleeding: Principal | ICD-10-CM | POA: Diagnosis present

## 2010-11-10 DIAGNOSIS — F1020 Alcohol dependence, uncomplicated: Secondary | ICD-10-CM | POA: Diagnosis present

## 2010-11-10 DIAGNOSIS — L259 Unspecified contact dermatitis, unspecified cause: Secondary | ICD-10-CM | POA: Diagnosis present

## 2010-11-10 DIAGNOSIS — F17200 Nicotine dependence, unspecified, uncomplicated: Secondary | ICD-10-CM | POA: Diagnosis present

## 2010-11-10 DIAGNOSIS — K861 Other chronic pancreatitis: Secondary | ICD-10-CM | POA: Diagnosis present

## 2010-11-10 DIAGNOSIS — E781 Pure hyperglyceridemia: Secondary | ICD-10-CM | POA: Diagnosis present

## 2010-11-10 DIAGNOSIS — Z794 Long term (current) use of insulin: Secondary | ICD-10-CM

## 2010-11-10 DIAGNOSIS — F1010 Alcohol abuse, uncomplicated: Secondary | ICD-10-CM

## 2010-11-10 LAB — COMPREHENSIVE METABOLIC PANEL
ALT: 29 U/L (ref 0–53)
AST: 32 U/L (ref 0–37)
Albumin: 3.6 g/dL (ref 3.5–5.2)
Calcium: 9.4 mg/dL (ref 8.4–10.5)
Creatinine, Ser: 0.81 mg/dL (ref 0.50–1.35)
GFR calc non Af Amer: >90 mL/min (ref >90)
Sodium: 140 mEq/L (ref 135–145)
Total Protein: 7.9 g/dL (ref 6.0–8.3)

## 2010-11-10 LAB — URINALYSIS, ROUTINE W REFLEX MICROSCOPIC
Ketones, ur: NEGATIVE mg/dL
Leukocytes, UA: NEGATIVE
Nitrite: NEGATIVE
Protein, ur: NEGATIVE mg/dL
Urobilinogen, UA: 0.2 mg/dL (ref 0.0–1.0)

## 2010-11-10 LAB — CBC
Hemoglobin: 14.7 g/dL (ref 13.0–17.0)
MCH: 29.3 pg (ref 26.0–34.0)
MCHC: 35.9 g/dL (ref 30.0–36.0)
Platelets: 221 10*3/uL (ref 150–400)

## 2010-11-10 LAB — HEMOGLOBIN A1C
Hgb A1c MFr Bld: 12.3 % — ABNORMAL HIGH (ref <5.7)
Mean Plasma Glucose: 306 mg/dL — ABNORMAL HIGH (ref <117)

## 2010-11-10 LAB — DIFFERENTIAL
Basophils Absolute: 0 10*3/uL (ref 0.0–0.1)
Basophils Relative: 1 % (ref 0–1)
Eosinophils Absolute: 0 10*3/uL (ref 0.0–0.7)
Monocytes Absolute: 0.3 10*3/uL (ref 0.1–1.0)
Monocytes Relative: 6 % (ref 3–12)
Neutro Abs: 1.8 10*3/uL (ref 1.7–7.7)
Neutrophils Relative %: 32 % — ABNORMAL LOW (ref 43–77)

## 2010-11-10 LAB — ETHANOL: Alcohol, Ethyl (B): 140 mg/dL — ABNORMAL HIGH (ref 0–11)

## 2010-11-11 LAB — CBC
MCV: 84.7 fL (ref 78.0–100.0)
Platelets: 155 10*3/uL (ref 150–400)
RDW: 13.1 % (ref 11.5–15.5)
WBC: 2.9 10*3/uL — ABNORMAL LOW (ref 4.0–10.5)

## 2010-11-11 LAB — GLUCOSE, CAPILLARY
Glucose-Capillary: 196 mg/dL — ABNORMAL HIGH (ref 70–99)
Glucose-Capillary: 158 mg/dL — ABNORMAL HIGH (ref 70–99)
Glucose-Capillary: 162 mg/dL — ABNORMAL HIGH (ref 70–99)
Glucose-Capillary: 219 mg/dL — ABNORMAL HIGH (ref 70–99)

## 2010-11-11 LAB — COMPREHENSIVE METABOLIC PANEL
AST: 27 U/L (ref 0–37)
BUN: 9 mg/dL (ref 6–23)
CO2: 28 mEq/L (ref 19–32)
Calcium: 8.4 mg/dL (ref 8.4–10.5)
Chloride: 102 mEq/L (ref 96–112)
Creatinine, Ser: 0.77 mg/dL (ref 0.50–1.35)
GFR calc Af Amer: >90 mL/min (ref >90)
GFR calc non Af Amer: >90 mL/min (ref >90)
Glucose, Bld: 105 mg/dL — ABNORMAL HIGH (ref 70–99)
Total Bilirubin: 0.5 mg/dL (ref 0.3–1.2)

## 2010-11-11 LAB — HEMOGLOBIN AND HEMATOCRIT, BLOOD
HCT: 36 % — ABNORMAL LOW (ref 39.0–52.0)
Hemoglobin: 13.9 g/dL (ref 13.0–17.0)

## 2010-11-11 LAB — DIFFERENTIAL
Basophils Absolute: 0.1 10*3/uL (ref 0.0–0.1)
Eosinophils Absolute: 0.1 10*3/uL (ref 0.0–0.7)
Lymphocytes Relative: 61 % — ABNORMAL HIGH (ref 12–46)
Neutro Abs: 0.6 10*3/uL — ABNORMAL LOW (ref 1.7–7.7)
Neutrophils Relative %: 21 % — ABNORMAL LOW (ref 43–77)

## 2010-11-11 LAB — PATHOLOGIST SMEAR REVIEW

## 2010-11-11 LAB — APTT: aPTT: 29 seconds (ref 24–37)

## 2010-11-11 LAB — PHOSPHORUS: Phosphorus: 4 mg/dL (ref 2.3–4.6)

## 2010-11-11 LAB — PROTIME-INR: Prothrombin Time: 14.1 seconds (ref 11.6–15.2)

## 2010-11-11 LAB — LIPASE, BLOOD: Lipase: 9 U/L — ABNORMAL LOW (ref 11–59)

## 2010-11-12 ENCOUNTER — Inpatient Hospital Stay (HOSPITAL_COMMUNITY): Payer: Self-pay

## 2010-11-12 LAB — GLUCOSE, CAPILLARY
Glucose-Capillary: 232 mg/dL — ABNORMAL HIGH (ref 70–99)
Glucose-Capillary: 237 mg/dL — ABNORMAL HIGH (ref 70–99)
Glucose-Capillary: 304 mg/dL — ABNORMAL HIGH (ref 70–99)

## 2010-11-12 LAB — HEMOGLOBIN AND HEMATOCRIT, BLOOD: Hemoglobin: 13.9 g/dL (ref 13.0–17.0)

## 2010-11-14 NOTE — Discharge Summary (Signed)
NAMEANDERSSON, [name] NO.:  [id number]  MEDICAL RECORD NO.:  [id number]  LOCATION:  1514                         FACILITY:  [facility]  PHYSICIAN:  Andreas Blower, MD       DATE OF BIRTH:  Mar 18, 1960  DATE OF ADMISSION:  11/10/2010 DATE OF DISCHARGE:  11/12/2010                              DISCHARGE SUMMARY   PRIMARY CARE PHYSICIAN:  [facility]  CONSULTATIONS:  Wilhemina Bonito. Marina Goodell, MD, with [facility] evaluated the patient during the course of the hospital stay.  DISCHARGE DIAGNOSES: 1. Gastrointestinal bleed with coffee-ground emesis, likely due to     alcoholic gastritis, resolved during the course of the hospital     stay.  Hemoglobin stable. 2. Abdominal pain, likely due to acute on chronic pancreatitis versus     alcoholic gastritis, improved during the course of the hospital     stay. 3. Nausea and vomiting, likely due to alcoholic gastritis, resolved. 4. Tachycardia, resolved. 5. Type 2 diabetes, stable. 6. Hypertension. 7. Alcohol use. 8. Hyperlipidemia. 9. History of seizure disorder. 10.History of alcoholic delirium tremens. 11.Alcoholic ketoacidosis. 12.Hypertriglyceridemia.  DISCHARGE MEDICATIONS: 1. Folic acid 1 mg p.o. daily. 2. Thiamine 100 mg p.o. daily. 3. Dulcolax 5 mg tablets 1-3 tablets daily as needed for constipation. 4. Creon 24 one capsule p.o. 3 times a day with meals. 5. Gemfibrozil 1200 mg p.o. twice daily with meals. 6. Lantus 45 units subcu daily. 7. Lisinopril 40 mg p.o. daily. 8. Multivitamin 1 tablet p.o. daily. 9. NovoLog pen 18-24 units subcu 3 times a day with meals. 10.Percocet 5/325 every 6 hours as needed for pain. 11.Phenytoin extended release 200 mg p.o. daily at bedtime. 12.Pantoprazole 40 mg p.o. daily.  BRIEF ADMITTING HISTORY AND PHYSICAL:  Gabriel Rush is a 50 year old gentleman with a history of alcohol abuse and chronic pancreatitis, who presented on November 10, 2010, with coffee-ground emesis and  abdominal pain.  RADIOLOGY/IMAGING:  No imaging obtained.  LABORATORY DATA:  CBC shows a white count of 2.9, hemoglobin 13.9, hematocrit 39.6, platelet count 155.  Electrolytes are normal with a BUN of 9, creatinine 0.77.  Liver function tests normal with an albumin of 3.1.  Hemoglobin A1c 12.3.  Blood alcohol level 140.  UA is negative for nitrites and leukocytes.  HOSPITAL COURSE BY PROBLEM: 1. GI bleed from coffee-ground emesis, likely due to alcoholic     gastritis with nausea and vomiting.  [facility] was consulted.     GI this time had no plans for EGD, as his hemoglobin was     stable and he was hemodynamically stable. The patient during     the course of the hospital stay had no further hematemesis after     being transferred from the ER.  The patient was started on clear     liquid diet and his diet was advanced as tolerated. 2. Abdominal pain likely due to acute on chronic pancreatitis versus     alcoholic gastritis, improved during the course of the hospital     stay. 3. Nausea with vomiting secondary to alcoholic gastritis, resolved. 4. Tachycardia, improved with hydration. 5. Type 2 diabetes.  The patient was started initially on Lantus 10  units subcu as the patient was n.p.o.  At discharge, blood sugars     were in the 300s, so we will resume the patient on home insulin     regimen at the time of discharge. 6. Hypertension, not well controlled.  Suspect the patient has a     degree of noncompliance.  Further titration of antihypertensive     medications to be done as an outpatient.  We will continue home     medications at the time of discharge. 7. Alcohol abuse and intoxication.  Had extensive discussion with     the patient about alcohol cessation.  Encouraged the patient     alcohol cessation.  Social worker met with the patient and offered     the patient resources on alcohol cessation.  The patient was     started initially on CIWA protocol and had no  episodes of delirium     tremens during the course of the hospital stay. 8. History of seizure disorder from delirium tremens.  Continue the     patient on Dilantin. 9. Hyperlipidemia, stable.  Continue the patient on home medications.  DISPOSITION AND FOLLOWUP:  The patient was instructed to follow up with [facility] in 1-2 weeks.  Resources were offered to the patient; however, he indicated that he will make the appointment with [facility] on his own.  Time spent on discharge talking to the patient and coordinating care was 35 minutes.     Andreas Blower, MD     SR/MEDQ  D:  11/12/2010  T:  11/13/2010  Job:  [id number]  Electronically Signed by Wardell Heath Amador Braddy  on 11/14/2010 09:03:57 PM

## 2010-11-15 NOTE — H&P (Signed)
Gabriel Rush, Gabriel Rush             ACCOUNT NO.:  [id number]  MEDICAL RECORD NO.:  [id number]  LOCATION:  1514                         FACILITY:  [facility]  PHYSICIAN:  Payden Docter, DO         DATE OF BIRTH:  12-20-1960  DATE OF ADMISSION:  11/10/2010 DATE OF DISCHARGE:                             HISTORY & PHYSICAL   CHIEF COMPLAINT:  Coffee-ground emesis, abdominal pain.  HISTORY OF PRESENT ILLNESS:  The patient is a 50 year old male who states that he has been drinking a lot lately.  He states that he was drinking a lot of vodka last night.  When around the 11 p.m., he began having severe epigastric and left upper quadrant abdominal pain.  He states that almost immediately thereafter he started vomiting.  He has vomited coffee-ground-appearing material and no frank blood.  He has been unable to keep anything down.  Since his last drink, he said it was at 5 a.m., but that he was unable to keep that down.  Here, the patient has been hypertensive and tachycardic, this by receiving 3 L of fluid.  PAST MEDICAL HISTORY: 1. Chronic pancreatitis. 2. Hypertension. 3. Diabetes mellitus. 4. Alcohol delirium tremens. 5. Alcoholic ketoacidosis. 6. Hypertriglyceridemia. 7. 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  PAST SURGICAL HISTORY:  Significant for appendectomy.  MEDICATIONS AT HOME: 1. Bisacodyl 5 mg 1-3 daily p.r.n. constipation. 2. Creon 24 mg. 3. Amylase, lipase, and protease 1 tablet t.i.d. with food. 4. Gemfibrozil 600 mg 2 tablets b.i.d. with food. 5. Lantus 46 units subcu daily. 6. Lisinopril 40 mg 1 p.o. daily. 7. Multivitamin 1 p.o. daily. 8. NovoLog 18-24 units t.i.d. a.c. 9. Percocet 5/325, 1 p.o. q.6 h. p.r.n. pain. 10.Phenytoin extended release 100 mg 3 capsules q.h.s. 11.Protonix 40 mg 1 p.o. daily.  ALLERGIES:  NKDA.  REVIEW OF SYSTEMS:  CONSTITUTIONAL:  Negative for fever.  Negative for chills.  Positive for weakness.  Positive for fatigue.  CNS:  No headaches.  No  seizures.  No limb weakness.  ENT:  No nasal congestion. No throat pain.  No coryza.  CARDIOVASCULAR:  No chest pain.  No palpitations.  No orthopnea.  RESPIRATORY:  No cough.  No shortness breath.  No wheezing.  GASTROINTESTINAL:  Positive for coffee-ground emesis.  Positive for epigastric pain.  Positive for nausea.  Positive for dry heaves.  Negative for constipation.  Positive for diarrhea. GENITOURINARY:  No dysuria.  No hematuria.  No urinary frequency. RENAL:  No flank pain.  No swelling.  No pruritus.  SKIN:  No rashes. No sores.  No lesions.  HEMATOLOGIC:  No easy bruising.  No purpura.  No clots.  LYMPHS:  No lymphadenopathy.  No painful lymph nodes.  No specific lymph swelling.  PSYCHIATRIC:  No anxiety.  No depression.  No insomnia.  SKIN:  Positive for erythematous and papular rash on his forearms bilaterally after having clearing some brush outside.  SOCIAL HISTORY:  He patient has a 40-pack-year-smoking history and he drinks quite heavily.  He cannot put a number on this and it is continuous with sporadic binges.FAMILY HISTORY:  Positive for CHF in his father at age 3.  PHYSICAL EXAMINATION:  VITAL SIGNS:  Temperature 98.0, pulse 118, respiratory rate 20, blood pressure 126/100. GENERAL:  The patient is awake, alert, and ordered x3.  He is somewhat hyperventilant and appears nervous. EYES:  Pupils equal, round, and reactive to light and accommodation. External ocular movements bilaterally intact.  Sclerae nonicteric, noninjected. MOUTH:  Oral mucosa dry.  No lesions.  No sores.  Pharynx clean.  No erythema.  No exudate. NECK:  Negative for JVD.  Negative for thyromegaly.  Negative for lymphadenopathy. HEART:  Regular rate and rhythm at 80 beats per minute without murmur, ectopy, or gallops.  No lateral PMI.  No thrills. LUNGS:  Clear to auscultation bilaterally without wheezes, rales, or rhonchi.  No increased work of breathing.  No tactile fremitus. ABDOMEN:  Soft,  nontender, and nondistended.  Positive bowel sounds.  No hepatosplenomegaly.  No hernias palpated. CARDIOVASCULAR/EXTREMITIES:  Negative for cyanosis, clubbing, or edema. The patient has positive dorsalis pedis and popliteal pulses bilaterally.  No carotid bruits bilaterally. NEUROLOGIC:  Cranial nerves II through XII gross intact.  Motor and sensory intact.  LABORATORY DATA:  WBC is 3.6, hemoglobin 14.7, hematocrit 41.0, platelets 221, neutrophils 32%.  Sodium 140, potassium 3.6, chloride 101, CO2 of 24, BUN is 12, creatinine 0.81, glucose 155, alk phos 97, AST 32, ALT 29, albumin 3.6, lipase is 9.  Urinalysis is negative.  EKG shows sinus tachycardia without ischemic changes.  ASSESSMENT: 1. Gastrointestinal bleed, coffee-ground emesis.  The patient's     hemoglobin/hematocrit appear normal, but of course he has received     3 L of fluid since he has been here. 2. Abdominal pain, pancreatitis, chronic versus pain from a     gastrointestinal bleed. 3. Alcohol dependence with a history of delirium tremens causing     seizures.  The patient will be given detox prophylaxis as well as     continued on his Dilantin as he takes it at home, although we will     have to change it to IV in the short term. 4. Tachycardia secondary to delirium tremens.  We will continue IV     fluids and also treat the patient's delirium tremens. 5. He has a contact dermatitis on his forearms bilaterally, for which     we will give him Benadryl. 6. Diabetes mellitus, uncontrolled.  We will give him a greatly     reduced dose of his usual Lantus and follow his fingerstick blood     sugars with sliding scale insulin. 7. Hypertension. 8. Hyperlipidemia.  PLAN: 1. Admit to tele. 2. IV fluids. 3. CIWA protocol. 4. Fingerstick blood sugar, sliding scale insulin with reduced dose of     Lantus daily. 5. Protonix. 6. GI consult.  I have spent 1 hour on this complex admission.           ______________________________ Fran Lowes, DO     AS/MEDQ  D:  11/10/2010  T:  11/11/2010  Job:  [id number]  cc:   Dala Dock  Electronically Signed by Fran Lowes DO on 11/15/2010 03:48:05 PM

## 2010-11-19 LAB — DIFFERENTIAL
Basophils Absolute: 0.1
Basophils Relative: 1
Eosinophils Relative: 1
Monocytes Absolute: 0.5

## 2010-11-19 LAB — URINALYSIS, ROUTINE W REFLEX MICROSCOPIC
Bilirubin Urine: NEGATIVE
Ketones, ur: 15 — AB
Nitrite: NEGATIVE
Protein, ur: NEGATIVE
Urobilinogen, UA: 0.2

## 2010-11-19 LAB — COMPREHENSIVE METABOLIC PANEL
ALT: 38
AST: 34
Albumin: 3.4 — ABNORMAL LOW
Alkaline Phosphatase: 111
BUN: 4 — ABNORMAL LOW
CO2: 23
Calcium: 9.1
Chloride: 91 — ABNORMAL LOW
Creatinine, Ser: 0.78
GFR calc Af Amer: >60
GFR calc non Af Amer: >60
Glucose, Bld: 529
Potassium: 4
Sodium: 125 — ABNORMAL LOW
Total Bilirubin: 0.9
Total Protein: 7.4

## 2010-11-19 LAB — KETONES, QUALITATIVE: Acetone, Bld: NEGATIVE

## 2010-11-19 LAB — POCT I-STAT 3, ART BLOOD GAS (G3+)
O2 Saturation: 97
TCO2: 25
pCO2 arterial: 38.9
pO2, Arterial: 87

## 2010-11-19 LAB — CBC
Platelets: 219
RBC: 4.12 — ABNORMAL LOW
WBC: 7.1

## 2010-11-30 ENCOUNTER — Emergency Department (HOSPITAL_COMMUNITY)
Admission: EM | Admit: 2010-11-30 | Discharge: 2010-11-30 | Disposition: A | Discharge status: Home / Self Care | Payer: Self-pay | Attending: Emergency Medicine | Admitting: Emergency Medicine

## 2010-11-30 DIAGNOSIS — K861 Other chronic pancreatitis: Secondary | ICD-10-CM | POA: Insufficient documentation

## 2010-11-30 DIAGNOSIS — F1020 Alcohol dependence, uncomplicated: Secondary | ICD-10-CM | POA: Insufficient documentation

## 2010-11-30 DIAGNOSIS — E119 Type 2 diabetes mellitus without complications: Secondary | ICD-10-CM | POA: Insufficient documentation

## 2010-11-30 DIAGNOSIS — I10 Essential (primary) hypertension: Secondary | ICD-10-CM | POA: Insufficient documentation

## 2010-11-30 DIAGNOSIS — R1084 Generalized abdominal pain: Secondary | ICD-10-CM | POA: Insufficient documentation

## 2010-11-30 LAB — HEPATIC FUNCTION PANEL
AST: 22 U/L (ref 0–37)
Bilirubin, Direct: <0.1 mg/dL (ref 0.0–0.3)

## 2010-11-30 LAB — BASIC METABOLIC PANEL
BUN: 10 mg/dL (ref 6–23)
Calcium: 9.5 mg/dL (ref 8.4–10.5)
Creatinine, Ser: 0.88 mg/dL (ref 0.50–1.35)
GFR calc Af Amer: >90 mL/min (ref >90)
GFR calc non Af Amer: >90 mL/min (ref >90)
Potassium: 3.8 mEq/L (ref 3.5–5.1)

## 2010-11-30 LAB — CBC
MCV: 82.3 fL (ref 78.0–100.0)
Platelets: 248 10*3/uL (ref 150–400)
RDW: 12.8 % (ref 11.5–15.5)
WBC: 5.1 10*3/uL (ref 4.0–10.5)

## 2010-11-30 LAB — RAPID URINE DRUG SCREEN, HOSP PERFORMED
Amphetamines: NOT DETECTED
Benzodiazepines: NOT DETECTED

## 2010-11-30 LAB — PHENYTOIN LEVEL, TOTAL: Phenytoin Lvl: <2.5 ug/mL — ABNORMAL LOW (ref 10.0–20.0)

## 2010-11-30 LAB — LIPASE, BLOOD: Lipase: 13 U/L (ref 11–59)

## 2010-11-30 LAB — GLUCOSE, CAPILLARY: Glucose-Capillary: 335 mg/dL — ABNORMAL HIGH (ref 70–99)

## 2010-12-13 ENCOUNTER — Emergency Department (HOSPITAL_COMMUNITY)
Admission: EM | Admit: 2010-12-13 | Discharge: 2010-12-13 | Disposition: A | Discharge status: Home / Self Care | Payer: Self-pay | Attending: Emergency Medicine | Admitting: Emergency Medicine

## 2010-12-13 ENCOUNTER — Encounter: Payer: Self-pay | Admitting: *Deleted

## 2010-12-13 DIAGNOSIS — K861 Other chronic pancreatitis: Secondary | ICD-10-CM | POA: Insufficient documentation

## 2010-12-13 DIAGNOSIS — F1010 Alcohol abuse, uncomplicated: Secondary | ICD-10-CM | POA: Insufficient documentation

## 2010-12-13 HISTORY — DX: Essential (primary) hypertension: I10

## 2010-12-13 LAB — GLUCOSE, CAPILLARY: Glucose-Capillary: 334 mg/dL — ABNORMAL HIGH (ref 70–99)

## 2010-12-13 NOTE — ED Notes (Signed)
CBG in traige 334

## 2010-12-13 NOTE — ED Notes (Signed)
Pt in stating he drank too much today, unable to determine how much ETOH, pt poor historian, brought in via EMS

## 2010-12-13 NOTE — ED Notes (Signed)
Per EMS pt from home, pt c/o chronic abd pain, ETOH on board, pt reports drinking Vodka all day, VSS bp 138/90, hr 90, rr 14, clear lungs bil, GCS >15, pain 4/10

## 2010-12-14 ENCOUNTER — Emergency Department (HOSPITAL_COMMUNITY)
Admission: EM | Admit: 2010-12-14 | Discharge: 2010-12-14 | Disposition: A | Discharge status: Home / Self Care | Payer: Self-pay | Attending: Emergency Medicine | Admitting: Emergency Medicine

## 2010-12-14 ENCOUNTER — Encounter (HOSPITAL_COMMUNITY): Payer: Self-pay | Admitting: *Deleted

## 2010-12-14 DIAGNOSIS — R1012 Left upper quadrant pain: Secondary | ICD-10-CM | POA: Insufficient documentation

## 2010-12-14 DIAGNOSIS — E119 Type 2 diabetes mellitus without complications: Secondary | ICD-10-CM | POA: Insufficient documentation

## 2010-12-14 DIAGNOSIS — K861 Other chronic pancreatitis: Secondary | ICD-10-CM | POA: Insufficient documentation

## 2010-12-14 DIAGNOSIS — R5381 Other malaise: Secondary | ICD-10-CM | POA: Insufficient documentation

## 2010-12-14 DIAGNOSIS — K2920 Alcoholic gastritis without bleeding: Secondary | ICD-10-CM | POA: Insufficient documentation

## 2010-12-14 DIAGNOSIS — Z79899 Other long term (current) drug therapy: Secondary | ICD-10-CM | POA: Insufficient documentation

## 2010-12-14 DIAGNOSIS — E860 Dehydration: Secondary | ICD-10-CM | POA: Insufficient documentation

## 2010-12-14 DIAGNOSIS — Z794 Long term (current) use of insulin: Secondary | ICD-10-CM | POA: Insufficient documentation

## 2010-12-14 DIAGNOSIS — I10 Essential (primary) hypertension: Secondary | ICD-10-CM | POA: Insufficient documentation

## 2010-12-14 DIAGNOSIS — G40909 Epilepsy, unspecified, not intractable, without status epilepticus: Secondary | ICD-10-CM | POA: Insufficient documentation

## 2010-12-14 DIAGNOSIS — R112 Nausea with vomiting, unspecified: Secondary | ICD-10-CM | POA: Insufficient documentation

## 2010-12-14 LAB — COMPREHENSIVE METABOLIC PANEL
ALT: 32 U/L (ref 0–53)
AST: 30 U/L (ref 0–37)
Albumin: 3.7 g/dL (ref 3.5–5.2)
CO2: 22 mEq/L (ref 19–32)
Chloride: 96 mEq/L (ref 96–112)
GFR calc non Af Amer: >90 mL/min (ref >90)
Potassium: 4 mEq/L (ref 3.5–5.1)
Sodium: 135 mEq/L (ref 135–145)
Total Bilirubin: 0.3 mg/dL (ref 0.3–1.2)

## 2010-12-14 LAB — CBC
MCH: 29.4 pg (ref 26.0–34.0)
MCHC: 36.7 g/dL — ABNORMAL HIGH (ref 30.0–36.0)
MCV: 80.2 fL (ref 78.0–100.0)
Platelets: 257 10*3/uL (ref 150–400)

## 2010-12-14 LAB — DIFFERENTIAL
Basophils Relative: 1 % (ref 0–1)
Eosinophils Absolute: 0.1 10*3/uL (ref 0.0–0.7)
Eosinophils Relative: 2 % (ref 0–5)
Monocytes Relative: 4 % (ref 3–12)
Neutrophils Relative %: 34 % — ABNORMAL LOW (ref 43–77)

## 2010-12-14 LAB — ETHANOL: Alcohol, Ethyl (B): 192 mg/dL — ABNORMAL HIGH (ref 0–11)

## 2010-12-14 LAB — LIPASE, BLOOD: Lipase: 11 U/L (ref 11–59)

## 2010-12-14 MED ORDER — OXYCODONE-ACETAMINOPHEN 5-325 MG PO TABS
2.0000 | ORAL_TABLET | Freq: Once | ORAL | Status: AC
  Administered 2010-12-14: 2 via ORAL
  Filled 2010-12-14: qty 2

## 2010-12-14 MED ORDER — HYDROMORPHONE HCL PF 1 MG/ML IJ SOLN
1.0000 mg | Freq: Once | INTRAMUSCULAR | Status: AC
  Administered 2010-12-14: 1 mg via INTRAVENOUS
  Filled 2010-12-14: qty 1

## 2010-12-14 MED ORDER — OMEPRAZOLE 20 MG PO CPDR: 20.0000 mg | DELAYED_RELEASE_CAPSULE | Freq: Every day | ORAL | Status: DC

## 2010-12-14 MED ORDER — ONDANSETRON HCL 4 MG/2ML IJ SOLN
4.0000 mg | Freq: Once | INTRAMUSCULAR | Status: AC
  Administered 2010-12-14: 4 mg via INTRAVENOUS
  Filled 2010-12-14: qty 2

## 2010-12-14 MED ORDER — SODIUM CHLORIDE 0.9 % IV SOLN
999.0000 mL | INTRAVENOUS | Status: DC
  Administered 2010-12-14: 999 mL via INTRAVENOUS

## 2010-12-14 MED ORDER — GI COCKTAIL ~~LOC~~
30.0000 mL | Freq: Once | ORAL | Status: AC
  Administered 2010-12-14: 30 mL via ORAL
  Filled 2010-12-14: qty 30

## 2010-12-14 NOTE — ED Notes (Signed)
IV was not charted. Pt had IV in L.AC. IV was removed prior to discharge

## 2010-12-14 NOTE — ED Notes (Signed)
The pt has ha severe abd pain for the past 30 minutes.  Vomiting and diarrhea

## 2010-12-14 NOTE — ED Notes (Signed)
Pt stable, alert and oriented/ NAD.

## 2010-12-14 NOTE — ED Provider Notes (Signed)
History     CSN: [id number] Arrival date & time: 12/14/2010  1:29 AM   None     Chief Complaint  Patient presents with  . Abdominal Pain    (Consider location/radiation/quality/duration/timing/severity/associated sxs/prior treatment) HPI Comments: LUQ pain nause  + ETOH on breath   The history is provided by the patient.    Past Medical History  Diagnosis Date  . Diabetes mellitus   . Hypertension   . Seizures     History reviewed. No pertinent past surgical history.  History reviewed. No pertinent family history.  History  Substance Use Topics  . Smoking status: Never Smoker   . Smokeless tobacco: Not on file  . Alcohol Use: Yes      Review of Systems  HENT: Negative.   Eyes: Negative.   Respiratory: Negative.   Cardiovascular: Negative.   Gastrointestinal: Positive for nausea, vomiting and abdominal pain.  Genitourinary: Negative.   Musculoskeletal: Negative.   Neurological: Positive for weakness.  Psychiatric/Behavioral: Negative.     Allergies  Review of patient's allergies indicates no known allergies.  Home Medications   Current Outpatient Rx  Name Route Sig Dispense Refill  . AMYLASE-LIPASE-PROTEASE 66.05-27-73 MU PO CPEP Oral Take 1 capsule by mouth 3 (three) times daily with meals.     Marland Kitchen GABAPENTIN 300 MG PO CAPS Oral Take 300 mg by mouth 3 (three) times daily.      . INSULIN ASPART 100 UNIT/ML [place] SOLN Subcutaneous Inject 14-18 Units into the skin 3 (three) times daily before meals. Based on sliding scale    . LANTUS [place] Subcutaneous Inject 40 Units into the skin at bedtime.     Marland Kitchen PANTOPRAZOLE SODIUM 40 MG PO TBEC Oral Take 40 mg by mouth daily.      Marland Kitchen DILANTIN PO Oral Take by mouth. 2 tabs at bedtime    . HYDROCHLOROTHIAZIDE PO Oral Take by mouth.      Marland Kitchen LISINOPRIL PO Oral Take by mouth.        BP 136/85  Pulse 66  Temp(Src) 97.8 F (36.6 C) (Oral)  Resp 20  SpO2 100%  Physical Exam  Constitutional: He appears well-developed and  well-nourished.  HENT:  Head: Normocephalic.  Eyes: EOM are normal.  Neck: Normal range of motion.  Cardiovascular: Normal rate.   Pulmonary/Chest: Breath sounds normal.  Abdominal: He exhibits no distension, no pulsatile liver, no abdominal bruit, no ascites and no mass. There is no hepatosplenomegaly. There is tenderness in the left upper quadrant. There is guarding. There is no rebound and no CVA tenderness.  Musculoskeletal: Normal range of motion.  Neurological: He is alert.  Skin: Skin is warm.  Psychiatric: He has a normal mood and affect.    ED Course  Procedures (including critical care time)   Labs Reviewed  COMPREHENSIVE METABOLIC PANEL  CBC  DIFFERENTIAL  LIPASE, BLOOD  ETHANOL   No results found.   No diagnosis found.    MDM  alcoholic with DM and pancreatitis has been drinking today now with pain nausea  Had discussion re ETOH abuse abd pancreatitis and DM       Arman Filter, NP 12/14/10 0440  Arman Filter, NP 12/14/10 [phone]

## 2010-12-14 NOTE — ED Provider Notes (Signed)
Medical screening examination/treatment/procedure(s) were performed by non-physician practitioner and as supervising physician I was immediately available for consultation/collaboration.   Lyanne Co, MD 12/14/10 [id number]

## 2010-12-14 NOTE — ED Notes (Signed)
Patient moved to stretcher 3,  No s/sx of distress.  EMT rechecked heartrate with value of 66 reported

## 2010-12-14 NOTE — ED Notes (Signed)
Patient up to urinate,  States he continues to have pain.  Requesting additional pain medications.  Will inform provider

## 2010-12-14 NOTE — ED Notes (Signed)
Pt given ice-chips and water for fluid trial

## 2010-12-14 NOTE — ED Provider Notes (Signed)
History     CSN: [id number] Arrival date & time: 12/14/2010  1:29 AM   First MD Initiated Contact with Patient 12/14/10 0501      Chief Complaint  Patient presents with  . Abdominal Pain    (Consider location/radiation/quality/duration/timing/severity/associated sxs/prior treatment) Patient is a 50 y.o. male presenting with abdominal pain.  Abdominal Pain The primary symptoms of the illness include abdominal pain.    Past Medical History  Diagnosis Date  . Diabetes mellitus   . Hypertension   . Seizures     History reviewed. No pertinent past surgical history.  History reviewed. No pertinent family history.  History  Substance Use Topics  . Smoking status: Never Smoker   . Smokeless tobacco: Not on file  . Alcohol Use: Yes      Review of Systems  Gastrointestinal: Positive for abdominal pain.    Allergies  Review of patient's allergies indicates no known allergies.  Home Medications   Current Outpatient Rx  Name Route Sig Dispense Refill  . AMYLASE-LIPASE-PROTEASE 66.05-27-73 MU PO CPEP Oral Take 1 capsule by mouth 3 (three) times daily with meals.     Marland Kitchen GABAPENTIN 300 MG PO CAPS Oral Take 300 mg by mouth 3 (three) times daily.      . INSULIN ASPART 100 UNIT/ML [place] SOLN Subcutaneous Inject 14-18 Units into the skin 3 (three) times daily before meals. Based on sliding scale    . LANTUS [place] Subcutaneous Inject 40 Units into the skin at bedtime.     Marland Kitchen PANTOPRAZOLE SODIUM 40 MG PO TBEC Oral Take 40 mg by mouth daily.      Marland Kitchen DILANTIN PO Oral Take by mouth. 2 tabs at bedtime    . HYDROCHLOROTHIAZIDE PO Oral Take by mouth.      Marland Kitchen LISINOPRIL PO Oral Take by mouth.        BP 136/85  Pulse 66  Temp(Src) 97.8 F (36.6 C) (Oral)  Resp 20  SpO2 100%  Physical Exam  ED Course  Procedures (including critical care time)  Labs Reviewed  COMPREHENSIVE METABOLIC PANEL - Abnormal; Notable for the following:    Glucose, Bld 331 (*)    All other components within  normal limits  CBC - Abnormal; Notable for the following:    MCHC 36.7 (*)    All other components within normal limits  DIFFERENTIAL - Abnormal; Notable for the following:    Neutrophils Relative 34 (*)    Lymphocytes Relative 59 (*)    All other components within normal limits  ETHANOL - Abnormal; Notable for the following:    Alcohol, Ethyl (B) 192 (*)    All other components within normal limits  LIPASE, BLOOD   No results found.   No diagnosis found.  Handoff from [facility] NP @ [phone]. Pt with EtOH onboard. Pt is being hydrated. Plan: d/c to home when tolerating PO fluids. 7:20 AM  Pt seen. Continues to have pain that he attributes to pancreatitis. 7:49 AM  Pt tolerating PO's in room. He states he is feeling better. He is requesting discharge to home. Patient states that he has f/u with a surgeon regarding a pseudocyst and chronic pancreatitis. Urged f/u. Urged alcohol cessation. Pt verbalizes understanding and agrees with plan. 9:50 AM  Vitals rechecked -- patient remains tachycardic 110-120s while sitting. He states he is feeling better. He does not feel lightheaded with standing. He states he is anxious because he is ready to go home. I recommended patient stay for additional fluids,  however he states he wants to leave. D/w Dr. Patria Mane. Pt urged to double fluid intake for next two days. Urged return if worsening. His pain is improved after oral pain meds. 10:34 AM     MDM  Patient with chronic pancreatitis, likely with vomiting due to alcohol consumption, alcoholic gastritis. Pt appears well, tolerating PO's, VSS, he is requesting d/c to home. He has surgery f/u.         Eustace Moore Vincent, [place] 12/14/10 1042

## 2010-12-15 ENCOUNTER — Emergency Department (HOSPITAL_COMMUNITY)
Admission: EM | Admit: 2010-12-15 | Discharge: 2010-12-15 | Disposition: A | Discharge status: Home / Self Care | Payer: Self-pay | Attending: Emergency Medicine | Admitting: Emergency Medicine

## 2010-12-15 ENCOUNTER — Encounter (HOSPITAL_COMMUNITY): Payer: Self-pay | Admitting: *Deleted

## 2010-12-15 ENCOUNTER — Emergency Department (HOSPITAL_COMMUNITY): Payer: Self-pay

## 2010-12-15 DIAGNOSIS — R000 Tachycardia, unspecified: Secondary | ICD-10-CM | POA: Insufficient documentation

## 2010-12-15 DIAGNOSIS — R1110 Vomiting, unspecified: Secondary | ICD-10-CM | POA: Insufficient documentation

## 2010-12-15 DIAGNOSIS — F1010 Alcohol abuse, uncomplicated: Secondary | ICD-10-CM | POA: Insufficient documentation

## 2010-12-15 DIAGNOSIS — R1013 Epigastric pain: Secondary | ICD-10-CM | POA: Insufficient documentation

## 2010-12-15 DIAGNOSIS — I10 Essential (primary) hypertension: Secondary | ICD-10-CM | POA: Insufficient documentation

## 2010-12-15 DIAGNOSIS — K861 Other chronic pancreatitis: Secondary | ICD-10-CM | POA: Insufficient documentation

## 2010-12-15 DIAGNOSIS — E119 Type 2 diabetes mellitus without complications: Secondary | ICD-10-CM | POA: Insufficient documentation

## 2010-12-15 DIAGNOSIS — R10816 Epigastric abdominal tenderness: Secondary | ICD-10-CM | POA: Insufficient documentation

## 2010-12-15 DIAGNOSIS — Z79899 Other long term (current) drug therapy: Secondary | ICD-10-CM | POA: Insufficient documentation

## 2010-12-15 HISTORY — DX: Acute pancreatitis without necrosis or infection, unspecified: K85.90

## 2010-12-15 LAB — DIFFERENTIAL
Basophils Absolute: 0.1 10*3/uL (ref 0.0–0.1)
Eosinophils Relative: 2 % (ref 0–5)
Lymphocytes Relative: 65 % — ABNORMAL HIGH (ref 12–46)
Monocytes Absolute: 0.3 10*3/uL (ref 0.1–1.0)

## 2010-12-15 LAB — URINALYSIS, ROUTINE W REFLEX MICROSCOPIC
Leukocytes, UA: NEGATIVE
Protein, ur: NEGATIVE mg/dL
Urobilinogen, UA: 0.2 mg/dL (ref 0.0–1.0)

## 2010-12-15 LAB — COMPREHENSIVE METABOLIC PANEL
CO2: 24 mEq/L (ref 19–32)
Calcium: 9.4 mg/dL (ref 8.4–10.5)
Creatinine, Ser: 0.85 mg/dL (ref 0.50–1.35)
GFR calc Af Amer: >90 mL/min (ref >90)
GFR calc non Af Amer: >90 mL/min (ref >90)
Glucose, Bld: 278 mg/dL — ABNORMAL HIGH (ref 70–99)

## 2010-12-15 LAB — CBC
HCT: 40.2 % (ref 39.0–52.0)
MCV: 82.4 fL (ref 78.0–100.0)
RDW: 12.5 % (ref 11.5–15.5)
WBC: 6.4 10*3/uL (ref 4.0–10.5)

## 2010-12-15 LAB — ETHANOL: Alcohol, Ethyl (B): 201 mg/dL — ABNORMAL HIGH (ref 0–11)

## 2010-12-15 MED ORDER — OXYCODONE-ACETAMINOPHEN 5-325 MG PO TABS: 2.0000 | ORAL_TABLET | Freq: Four times a day (QID) | ORAL | Status: AC | PRN

## 2010-12-15 MED ORDER — ONDANSETRON HCL 4 MG/2ML IJ SOLN
4.0000 mg | Freq: Once | INTRAMUSCULAR | Status: AC
  Administered 2010-12-15: 4 mg via INTRAVENOUS
  Filled 2010-12-15: qty 2

## 2010-12-15 MED ORDER — SODIUM CHLORIDE 0.9 % IV BOLUS (SEPSIS)
1000.0000 mL | Freq: Once | INTRAVENOUS | Status: AC
  Administered 2010-12-15: 1000 mL via INTRAVENOUS

## 2010-12-15 MED ORDER — IOHEXOL 300 MG/ML  SOLN
100.0000 mL | Freq: Once | INTRAMUSCULAR | Status: AC | PRN
  Administered 2010-12-15: 100 mL via INTRAVENOUS

## 2010-12-15 MED ORDER — HYDROMORPHONE HCL PF 1 MG/ML IJ SOLN
1.0000 mg | Freq: Once | INTRAMUSCULAR | Status: AC
  Administered 2010-12-15: 1 mg via INTRAVENOUS
  Filled 2010-12-15: qty 1

## 2010-12-15 MED ORDER — SODIUM CHLORIDE 0.9 % IV SOLN
Freq: Once | INTRAVENOUS | Status: AC
  Administered 2010-12-15: 20 mL via INTRAVENOUS

## 2010-12-15 MED ORDER — HYDROMORPHONE HCL PF 1 MG/ML IJ SOLN
2.0000 mg | Freq: Once | INTRAMUSCULAR | Status: AC
  Administered 2010-12-15: 2 mg via INTRAVENOUS
  Filled 2010-12-15: qty 1

## 2010-12-15 MED ORDER — HYDROMORPHONE HCL PF 1 MG/ML IJ SOLN
2.0000 mg | Freq: Once | INTRAMUSCULAR | Status: AC
  Administered 2010-12-15: 2 mg via INTRAVENOUS
  Filled 2010-12-15: qty 2

## 2010-12-15 NOTE — ED Notes (Signed)
BP 147/94  Pulse 115  Temp 98.7 F (37.1 C)  Resp 20  SpO2 96%  Pt remains mildly tachycardic. Pain controlled. CTA negative acute abnl. Discharge home with analgesia.  Forbes Cellar, MD 12/15/10 [phone]

## 2010-12-15 NOTE — ED Provider Notes (Signed)
Medical screening examination/treatment/procedure(s) were performed by non-physician practitioner and as supervising physician I was immediately available for consultation/collaboration.   Juliet Rude. Rubin Payor, MD 12/15/10 0000

## 2010-12-15 NOTE — ED Notes (Signed)
BP 139/92  Pulse 112  Temp 98.7 F (37.1 C)  Resp 18  SpO2 97%   Tachycardia and abd pain persistent after Dilaudid and 4L IVF. Pt not tachycardic on recent ED visit. Will move to CDU. CT A/P ordered. PA Langley Adie aware  Forbes Cellar, MD 12/15/10 1123

## 2010-12-15 NOTE — ED Notes (Signed)
Pt trying to provide urine sample.

## 2010-12-15 NOTE — ED Notes (Signed)
RN went to get set of vital signs. Pt' IV was out. RN let MD know and started another. NS finishing at 999 ml/hr.

## 2010-12-15 NOTE — ED Notes (Signed)
The pt is c.o abd pain .  He has been drinking alcohol and he says that always causes his pancreatitis to act up

## 2010-12-15 NOTE — ED Provider Notes (Signed)
History     CSN: [id number] Arrival date & time: 12/15/2010  2:50 AM   First MD Initiated Contact with Patient 12/15/10 0500      Chief Complaint  Patient presents with  . Abdominal Pain    (Consider location/radiation/quality/duration/timing/severity/associated sxs/prior treatment) Patient is a 50 y.o. male presenting with abdominal pain. The history is provided by the patient. No language interpreter was used.  Abdominal Pain The primary symptoms of the illness include abdominal pain, nausea and vomiting. The primary symptoms of the illness do not include fever, fatigue, shortness of breath or dysuria. The current episode started 13 to 24 hours ago. The onset of the illness was gradual. The problem has been gradually worsening.  The abdominal pain began 13 to24 hours ago. The pain came on gradually. The abdominal pain has been gradually worsening since its onset. The abdominal pain is located in the epigastric region. The abdominal pain does not radiate. The abdominal pain is relieved by nothing. The abdominal pain is exacerbated by vomiting.  The illness is associated with alcohol use. Symptoms associated with the illness do not include chills, anorexia, constipation, urgency, hematuria, frequency or back pain. Associated medical issues comments: chronic pancreatitis.    Past Medical History  Diagnosis Date  . Diabetes mellitus   . Hypertension   . Seizures   . Pancreatitis     History reviewed. No pertinent past surgical history.  History reviewed. No pertinent family history.  History  Substance Use Topics  . Smoking status: Never Smoker   . Smokeless tobacco: Not on file  . Alcohol Use: Yes      Review of Systems  Constitutional: Negative for fever, chills, activity change, appetite change and fatigue.  HENT: Negative for congestion, sore throat, rhinorrhea, neck pain and neck stiffness.   Respiratory: Negative for cough and shortness of breath.   Cardiovascular:  Negative for chest pain and palpitations.  Gastrointestinal: Positive for nausea, vomiting and abdominal pain. Negative for constipation and anorexia.  Genitourinary: Negative for dysuria, urgency, frequency, hematuria and flank pain.  Musculoskeletal: Negative for back pain.  Neurological: Negative for dizziness, weakness, light-headedness, numbness and headaches.  All other systems reviewed and are negative.    Allergies  Review of patient's allergies indicates no known allergies.  Home Medications   Current Outpatient Rx  Name Route Sig Dispense Refill  . ACETAMINOPHEN 500 MG PO TABS Oral Take 1,000 mg by mouth every 6 (six) hours as needed. Equate brand arthritis pain med     . AMYLASE-LIPASE-PROTEASE 66.05-27-73 MU PO CPEP Oral Take 1 capsule by mouth 3 (three) times daily with meals. Usually eats 2 large meals per day    . GABAPENTIN 300 MG PO CAPS Oral Take 300 mg by mouth 3 (three) times daily.      Marland Kitchen HYDROCHLOROTHIAZIDE PO Oral Take 25 mg by mouth daily.     . IBUPROFEN 200 MG PO TABS Oral Take 200-400 mg by mouth every 6 (six) hours as needed. pain     . INSULIN ASPART 100 UNIT/ML [place] SOLN Subcutaneous Inject 14-18 Units into the skin 3 (three) times daily before meals. Based on sliding scale    . LANTUS  Subcutaneous Inject 40 Units into the skin at bedtime.     Marland Kitchen LISINOPRIL PO Oral Take 10 mg by mouth daily.     Marland Kitchen OMEPRAZOLE 20 MG PO CPDR Oral Take 1 capsule (20 mg total) by mouth daily. 20 capsule 0  . OXYCODONE-ACETAMINOPHEN 5-325 MG PO TABS  Oral Take 1-2 tablets by mouth every 4 (four) hours as needed. pain     . PANTOPRAZOLE SODIUM 40 MG PO TBEC Oral Take 40 mg by mouth daily.      Marland Kitchen PHENYTOIN SODIUM EXTENDED 100 MG PO CAPS Oral Take 200 mg by mouth at bedtime.        BP 134/88  Pulse 124  Temp 98.7 F (37.1 C)  Resp 20  SpO2 94%  Physical Exam  Nursing note and vitals reviewed. Constitutional: He is oriented to person, place, and time. He appears well-developed  and well-nourished. No distress.       Smells of alcohol  HENT:  Head: Normocephalic and atraumatic.  Mouth/Throat: Oropharynx is clear and moist.  Eyes: Conjunctivae and EOM are normal. Pupils are equal, round, and reactive to light.  Neck: Normal range of motion. Neck supple.  Cardiovascular: Regular rhythm, normal heart sounds and intact distal pulses.  Exam reveals no gallop and no friction rub.   No murmur heard.      Tachycardic rate  Pulmonary/Chest: Effort normal and breath sounds normal. No respiratory distress.  Abdominal: Soft. Bowel sounds are normal. There is tenderness (Views abdominal pain worse in the epigastrium. Moderate on palpation). There is no rebound and no guarding.  Musculoskeletal: Normal range of motion. He exhibits no tenderness.  Neurological: He is alert and oriented to person, place, and time.  Skin: Skin is warm and dry. No rash noted.    ED Course  Procedures (including critical care time)  Labs Reviewed  DIFFERENTIAL - Abnormal; Notable for the following:    Neutrophils Relative 28 (*)    Lymphocytes Relative 65 (*)    Lymphs Abs 4.2 (*)    All other components within normal limits  COMPREHENSIVE METABOLIC PANEL - Abnormal; Notable for the following:    Glucose, Bld 278 (*)    All other components within normal limits  URINALYSIS, ROUTINE W REFLEX MICROSCOPIC - Abnormal; Notable for the following:    Specific Gravity, Urine 1.037 (*)    Glucose, UA >1000 (*)    Ketones, ur 15 (*)    All other components within normal limits  ETHANOL - Abnormal; Notable for the following:    Alcohol, Ethyl (B) 201 (*)    All other components within normal limits  CBC  LIPASE, BLOOD  URINE MICROSCOPIC-ADD ON   No results found.   1. Chronic pancreatitis   2. Alcohol abuse       MDM  This is the patient's second visit in the past 2 days with the same complaints. He has a history of chronic pancreatitis associated with alcoholism. States he drank  alcohol to celebrate the election and redeveloped his pain consistent with chronic pancreatitis. He arrived tachycardic. IV was established. He received aggressive hydration. Pain control antiemetics were minister. Laboratory studies were performed and relatively unremarkable except for a glucose of 278. We will continue to monitor the patient and continue ivf until his tachycardia resolves.  If no improvement will admit otherwise will dc home with sx control/  The patient was signed out to my colleague dr Hyman Hopes.        Dayton Bailiff, MD 12/15/10 [phone]

## 2010-12-15 NOTE — ED Notes (Signed)
Patient is resting comfortably. Drinking contrast for ct. Denies pain.

## 2010-12-15 NOTE — ED Notes (Signed)
Pt with no c/o. Resting comfortably.

## 2010-12-15 NOTE — ED Notes (Signed)
Pt reports epigastric pain and reports he has been consuming vodka tonight.

## 2010-12-22 ENCOUNTER — Encounter (HOSPITAL_COMMUNITY): Payer: Self-pay | Admitting: Emergency Medicine

## 2010-12-22 ENCOUNTER — Emergency Department (HOSPITAL_COMMUNITY)
Admission: EM | Admit: 2010-12-22 | Discharge: 2010-12-23 | Disposition: A | Discharge status: Home / Self Care | Payer: Self-pay | Attending: Emergency Medicine | Admitting: Emergency Medicine

## 2010-12-22 DIAGNOSIS — Z9889 Other specified postprocedural states: Secondary | ICD-10-CM | POA: Insufficient documentation

## 2010-12-22 DIAGNOSIS — Z79899 Other long term (current) drug therapy: Secondary | ICD-10-CM | POA: Insufficient documentation

## 2010-12-22 DIAGNOSIS — R112 Nausea with vomiting, unspecified: Secondary | ICD-10-CM

## 2010-12-22 DIAGNOSIS — R10819 Abdominal tenderness, unspecified site: Secondary | ICD-10-CM | POA: Insufficient documentation

## 2010-12-22 DIAGNOSIS — L989 Disorder of the skin and subcutaneous tissue, unspecified: Secondary | ICD-10-CM | POA: Insufficient documentation

## 2010-12-22 DIAGNOSIS — Z794 Long term (current) use of insulin: Secondary | ICD-10-CM | POA: Insufficient documentation

## 2010-12-22 DIAGNOSIS — I10 Essential (primary) hypertension: Secondary | ICD-10-CM | POA: Insufficient documentation

## 2010-12-22 DIAGNOSIS — R1013 Epigastric pain: Secondary | ICD-10-CM | POA: Insufficient documentation

## 2010-12-22 DIAGNOSIS — K7689 Other specified diseases of liver: Secondary | ICD-10-CM | POA: Insufficient documentation

## 2010-12-22 DIAGNOSIS — F1010 Alcohol abuse, uncomplicated: Secondary | ICD-10-CM

## 2010-12-22 DIAGNOSIS — E119 Type 2 diabetes mellitus without complications: Secondary | ICD-10-CM | POA: Insufficient documentation

## 2010-12-22 MED ORDER — SODIUM CHLORIDE 0.9 % IV SOLN
20.0000 mL | INTRAVENOUS | Status: DC
  Administered 2010-12-23: 1000 mL via INTRAVENOUS

## 2010-12-22 NOTE — ED Notes (Signed)
WUJ:WJ19<JY> Expected date:12/22/10<BR> Expected time:11:33 PM<BR> Means of arrival:Ambulance<BR> Comments:<BR> pancreatitis

## 2010-12-22 NOTE — ED Notes (Signed)
Pt reports he drank a fifth of vodka today, and now complains of abdominal pain and vomiting.

## 2010-12-23 LAB — DIFFERENTIAL
Eosinophils Relative: 2 % (ref 0–5)
Lymphocytes Relative: 58 % — ABNORMAL HIGH (ref 12–46)
Lymphs Abs: 3.7 10*3/uL (ref 0.7–4.0)
Monocytes Absolute: 0.4 10*3/uL (ref 0.1–1.0)

## 2010-12-23 LAB — HEPATIC FUNCTION PANEL
ALT: 37 U/L (ref 0–53)
AST: 29 U/L (ref 0–37)
Alkaline Phosphatase: 101 U/L (ref 39–117)
Total Protein: 7.6 g/dL (ref 6.0–8.3)

## 2010-12-23 LAB — RAPID URINE DRUG SCREEN, HOSP PERFORMED
Amphetamines: NOT DETECTED
Benzodiazepines: NOT DETECTED
Opiates: NOT DETECTED
Tetrahydrocannabinol: POSITIVE — AB

## 2010-12-23 LAB — CBC
HCT: 41 % (ref 39.0–52.0)
MCV: 82.8 fL (ref 78.0–100.0)
RBC: 4.95 MIL/uL (ref 4.22–5.81)
WBC: 6.5 10*3/uL (ref 4.0–10.5)

## 2010-12-23 MED ORDER — ONDANSETRON 8 MG PO TBDP
8.0000 mg | ORAL_TABLET | Freq: Once | ORAL | Status: AC
  Administered 2010-12-23: 8 mg via ORAL
  Filled 2010-12-23: qty 1

## 2010-12-23 MED ORDER — GI COCKTAIL ~~LOC~~
30.0000 mL | Freq: Once | ORAL | Status: AC
  Administered 2010-12-23: 30 mL via ORAL
  Filled 2010-12-23: qty 30

## 2010-12-23 MED ORDER — ONDANSETRON HCL 4 MG/2ML IJ SOLN
4.0000 mg | Freq: Once | INTRAMUSCULAR | Status: AC
  Administered 2010-12-23: 4 mg via INTRAVENOUS
  Filled 2010-12-23: qty 2

## 2010-12-23 MED ORDER — SODIUM CHLORIDE 0.9 % IV SOLN: 1000.0000 mL | Freq: Once | INTRAVENOUS | Status: DC

## 2010-12-23 MED ORDER — ONDANSETRON 4 MG PO TBDP: 4.0000 mg | ORAL_TABLET | Freq: Four times a day (QID) | ORAL | Status: AC | PRN

## 2010-12-23 NOTE — ED Notes (Signed)
Pt called out requesting pain meds for his abd pain. Will make MD aware

## 2010-12-23 NOTE — ED Notes (Signed)
Pt given discharge instructions and verbalizes understanding  

## 2010-12-23 NOTE — ED Notes (Signed)
PA aware of pt request

## 2010-12-23 NOTE — ED Provider Notes (Signed)
Medical screening examination/treatment/procedure(s) were performed by non-physician practitioner and as supervising physician I was immediately available for consultation/collaboration.  Olivia Mackie, MD 12/23/10 [phone]

## 2010-12-23 NOTE — ED Notes (Signed)
Pt resting on stretcher. Pt denies any complaints. Pt cont to await further dispo. Bed locked in low position, SR up x 2, call bell in reach. Will cont to monitor. 

## 2010-12-23 NOTE — ED Notes (Signed)
Pt. Moved from triage to Room 1 was not moved to RESB. Error in documentation.

## 2010-12-23 NOTE — ED Notes (Signed)
Blood draw from R hand via syringe.  10cc wasted. Green top obtained. PIV flushed with 10 NS. Pt tolerated well.

## 2010-12-23 NOTE — ED Provider Notes (Signed)
History     CSN: [id number] Arrival date & time: 12/22/2010 11:46 PM   First MD Initiated Contact with Patient 12/23/10 0128      Chief Complaint  Patient presents with  . Alcohol Intoxication  . Abdominal Pain    (Consider location/radiation/quality/duration/timing/severity/associated sxs/prior treatment) HPI Comments: Patient was brought to emergency Department via ambulance D2 alcohol intoxication, vomiting, abdominal pain. Of note the patient has a history of pancreatitis, type 2 diabetes, a fatty liver and alcoholism. The patient states that he drank a fifth of vodka . The patient came in for similar symptoms on November 7 one week ago.  Patient is a 50 y.o. male presenting with intoxication and abdominal pain. The history is provided by the patient.  Alcohol Intoxication Associated symptoms include abdominal pain.  Abdominal Pain The primary symptoms of the illness include abdominal pain.    Past Medical History  Diagnosis Date  . Diabetes mellitus   . Hypertension   . Seizures   . Pancreatitis     Past Surgical History  Procedure Date  . Appendectomy     No family history on file.  History  Substance Use Topics  . Smoking status: Former Smoker    Types: Cigarettes    Quit date: 12/21/1984  . Smokeless tobacco: Not on file  . Alcohol Use: Yes     heavy drinker: binge drinking per pt      Review of Systems  Gastrointestinal: Positive for abdominal pain.  All other systems reviewed and are negative.    Allergies  Review of patient's allergies indicates no known allergies.  Home Medications   Current Outpatient Rx  Name Route Sig Dispense Refill  . ACETAMINOPHEN 500 MG PO TABS Oral Take 1,000 mg by mouth every 6 (six) hours as needed. Equate brand arthritis pain med     . AMYLASE-LIPASE-PROTEASE 66.05-27-73 MU PO CPEP Oral Take 1 capsule by mouth 3 (three) times daily with meals. Usually eats 2 large meals per day    . GABAPENTIN 300 MG PO CAPS  Oral Take 300 mg by mouth 3 (three) times daily.      Marland Kitchen HYDROCHLOROTHIAZIDE PO Oral Take 25 mg by mouth daily.     . IBUPROFEN 200 MG PO TABS Oral Take 200-400 mg by mouth every 6 (six) hours as needed. pain     . INSULIN ASPART 100 UNIT/ML  SOLN Subcutaneous Inject 14-18 Units into the skin 3 (three) times daily before meals. Based on sliding scale    . LANTUS  Subcutaneous Inject 40 Units into the skin at bedtime.     Marland Kitchen LISINOPRIL PO Oral Take 10 mg by mouth daily.     Marland Kitchen OMEPRAZOLE 20 MG PO CPDR Oral Take 1 capsule (20 mg total) by mouth daily. 20 capsule 0  . OXYCODONE-ACETAMINOPHEN 5-325 MG PO TABS Oral Take 1-2 tablets by mouth every 4 (four) hours as needed. pain     . OXYCODONE-ACETAMINOPHEN 5-325 MG PO TABS Oral Take 2 tablets by mouth every 6 (six) hours as needed for pain. 15 tablet 0  . PANTOPRAZOLE SODIUM 40 MG PO TBEC Oral Take 40 mg by mouth daily.      Marland Kitchen PHENYTOIN SODIUM EXTENDED 100 MG PO CAPS Oral Take 200 mg by mouth at bedtime.        BP 153/117  Pulse 119  Temp(Src) 97.7 F (36.5 C) (Oral)  Resp 18  Ht 5\' 11"  (1.803 m)  Wt 219 lb (99.338 kg)  BMI 30.54 kg/m2  SpO2 96%  Physical Exam  Nursing note and vitals reviewed. Constitutional: He is oriented to person, place, and time. He appears well-developed and well-nourished. No distress.  HENT:  Head: Normocephalic and atraumatic.  Eyes:       Normal appearance  Neck: Normal range of motion.  Abdominal: Soft. Bowel sounds are normal. There is tenderness in the right upper quadrant, epigastric area and left upper quadrant.  Neurological: He is alert and oriented to person, place, and time.  Skin:     Psychiatric: He has a normal mood and affect. His behavior is normal.    ED Course  Procedures (including critical care time)  Labs Reviewed  DIFFERENTIAL - Abnormal; Notable for the following:    Neutrophils Relative 34 (*)    Lymphocytes Relative 58 (*)    All other components within normal limits    LIPASE, BLOOD - Abnormal; Notable for the following:    Lipase 8 (*)    All other components within normal limits  ETHANOL - Abnormal; Notable for the following:    Alcohol, Ethyl (B) 326 (*)    All other components within normal limits  CBC  HEPATIC FUNCTION PANEL  AMYLASE  URINE RAPID DRUG SCREEN (HOSP PERFORMED)   No results found.   No diagnosis found.  2:54 AM Patient is currently intoxicated and requesting pain medication. Zofran given as well as a GI cocktail due to location of the pain being predominantly epigastric in nature. No the patient has chronic pancreatitis, a fatty liver, and has been to the emergency department multiple times for the same complaint of nausea vomiting and abdominal pain. This is likely another case of his alcoholic gastritis.  4:40 AM Patient reevaluated and is currently sleeping. We'll recheck his alcohol levels in the morning.  6:31 AM Pts original ETOH was 326 & is now 267. Pt is able to ambulate with out any difficulty, is hemodynamically stable, has no ataxia, no slurred speech, and no current complaints. Discussed the patient EtOH levels with Dr. Norlene Campbell who agrees that this patient can be discharged because he is back to baseline. Despite pts alcohol level he appears clinically sober & will be dc home with zofran for nausea.   MDM  Abdominal Pain/ Nausea  Alcoholism Fatty Liver Chronic Pancreatitis         Jaci Carrel, [place] 12/23/10 [phone]

## 2010-12-30 NOTE — ED Provider Notes (Addendum)
History     CSN: [id number] Arrival date & time: 12/13/2010  8:42 PM   First MD Initiated Contact with Patient 12/13/10 2111      Chief Complaint  Patient presents with  . Alcohol Intoxication    HPI  Patient was not seen by me during this ED visit. ? Eloped ?   Servando Snare Bivens, RN 12/13/2010 21:06    Pt in stating he drank too much today, unable to determine how much ETOH, pt poor historian, brought in via EMS         Gerarda Fraction, RN 12/13/2010 19:50      Per EMS pt from home, pt c/o chronic abd pain, ETOH on board, pt reports drinking Vodka all day, VSS bp 138/90, hr 90, rr 14, clear lungs bil, GCS >15, pain 4/10     Past Medical History  Diagnosis Date  . Diabetes mellitus   . Hypertension   . Seizures   . Pancreatitis     Past Surgical History  Procedure Date  . Appendectomy     No family history on file.  History  Substance Use Topics  . Smoking status: Former Smoker    Types: Cigarettes    Quit date: 12/21/1984  . Smokeless tobacco: Not on file  . Alcohol Use: Yes     heavy drinker: binge drinking per pt      Review of Systems  Allergies  Review of patient's allergies indicates no known allergies.  Home Medications   Current Outpatient Rx  Name Route Sig Dispense Refill  . INSULIN ASPART 100 UNIT/ML [place] SOLN Subcutaneous Inject 14-18 Units into the skin 3 (three) times daily before meals. Based on sliding scale    . LANTUS [place] Subcutaneous Inject 40 Units into the skin at bedtime.     . ACETAMINOPHEN 500 MG PO TABS Oral Take 1,000 mg by mouth every 6 (six) hours as needed. Equate brand arthritis pain med     . AMYLASE-LIPASE-PROTEASE 66.05-27-73 MU PO CPEP Oral Take 1 capsule by mouth 3 (three) times daily with meals. Usually eats 2 large meals per day    . GABAPENTIN 300 MG PO CAPS Oral Take 300 mg by mouth 3 (three) times daily.      Marland Kitchen HYDROCHLOROTHIAZIDE PO Oral Take 25 mg by mouth daily.     . IBUPROFEN 200 MG PO TABS Oral Take  200-400 mg by mouth every 6 (six) hours as needed. pain     . LISINOPRIL PO Oral Take 10 mg by mouth daily.     Marland Kitchen OMEPRAZOLE 20 MG PO CPDR Oral Take 1 capsule (20 mg total) by mouth daily. 20 capsule 0  . ONDANSETRON 4 MG PO TBDP Oral Take 1 tablet (4 mg total) by mouth every 6 (six) hours as needed for nausea. 6 tablet 0  . OXYCODONE-ACETAMINOPHEN 5-325 MG PO TABS Oral Take 1-2 tablets by mouth every 4 (four) hours as needed. pain     . PANTOPRAZOLE SODIUM 40 MG PO TBEC Oral Take 40 mg by mouth daily.      Marland Kitchen PHENYTOIN SODIUM EXTENDED 100 MG PO CAPS Oral Take 200 mg by mouth at bedtime.        BP 117/87  Pulse 138  Temp(Src) 99.1 F (37.3 C) (Oral)  Resp 20  SpO2 95%  Physical Exam  ED Course  Procedures (including critical care time)  Labs Reviewed  GLUCOSE, CAPILLARY - Abnormal; Notable for the following:    Glucose-Capillary 334 (*)  All other components within normal limits  LAB REPORT - SCANNED   No results found.   1. Chronic pancreatitis   2. Alcohol abuse       MDM  Patient was not seen by me during the ED visit. ? Eloped ?   Forbes Cellar, MD 12/30/10 2841  Forbes Cellar, MD 12/30/10 [phone]

## 2011-01-05 ENCOUNTER — Encounter (HOSPITAL_COMMUNITY): Payer: Self-pay | Admitting: Emergency Medicine

## 2011-01-05 ENCOUNTER — Emergency Department (HOSPITAL_COMMUNITY)
Admission: EM | Admit: 2011-01-05 | Discharge: 2011-01-05 | Disposition: A | Discharge status: Home / Self Care | Payer: Self-pay | Attending: Emergency Medicine | Admitting: Emergency Medicine

## 2011-01-05 DIAGNOSIS — R739 Hyperglycemia, unspecified: Secondary | ICD-10-CM

## 2011-01-05 DIAGNOSIS — G40909 Epilepsy, unspecified, not intractable, without status epilepticus: Secondary | ICD-10-CM | POA: Insufficient documentation

## 2011-01-05 DIAGNOSIS — E119 Type 2 diabetes mellitus without complications: Secondary | ICD-10-CM | POA: Insufficient documentation

## 2011-01-05 DIAGNOSIS — R142 Eructation: Secondary | ICD-10-CM | POA: Insufficient documentation

## 2011-01-05 DIAGNOSIS — I10 Essential (primary) hypertension: Secondary | ICD-10-CM | POA: Insufficient documentation

## 2011-01-05 DIAGNOSIS — Z794 Long term (current) use of insulin: Secondary | ICD-10-CM | POA: Insufficient documentation

## 2011-01-05 DIAGNOSIS — R109 Unspecified abdominal pain: Secondary | ICD-10-CM | POA: Insufficient documentation

## 2011-01-05 DIAGNOSIS — G8929 Other chronic pain: Secondary | ICD-10-CM | POA: Insufficient documentation

## 2011-01-05 DIAGNOSIS — R112 Nausea with vomiting, unspecified: Secondary | ICD-10-CM | POA: Insufficient documentation

## 2011-01-05 DIAGNOSIS — Z79899 Other long term (current) drug therapy: Secondary | ICD-10-CM | POA: Insufficient documentation

## 2011-01-05 DIAGNOSIS — R141 Gas pain: Secondary | ICD-10-CM | POA: Insufficient documentation

## 2011-01-05 LAB — CBC
HCT: 38.6 % — ABNORMAL LOW (ref 39.0–52.0)
Hemoglobin: 13.6 g/dL (ref 13.0–17.0)
MCV: 82.3 fL (ref 78.0–100.0)
RBC: 4.69 MIL/uL (ref 4.22–5.81)
RDW: 12.7 % (ref 11.5–15.5)
WBC: 4.6 10*3/uL (ref 4.0–10.5)

## 2011-01-05 LAB — DIFFERENTIAL
Eosinophils Relative: 5 % (ref 0–5)
Lymphocytes Relative: 63 % — ABNORMAL HIGH (ref 12–46)
Lymphs Abs: 2.9 10*3/uL (ref 0.7–4.0)
Monocytes Absolute: 0.3 10*3/uL (ref 0.1–1.0)
Monocytes Relative: 6 % (ref 3–12)

## 2011-01-05 LAB — COMPREHENSIVE METABOLIC PANEL
Alkaline Phosphatase: 79 U/L (ref 39–117)
BUN: 12 mg/dL (ref 6–23)
CO2: 23 mEq/L (ref 19–32)
Chloride: 104 mEq/L (ref 96–112)
Creatinine, Ser: 0.8 mg/dL (ref 0.50–1.35)
GFR calc non Af Amer: >90 mL/min (ref >90)
Glucose, Bld: 290 mg/dL — ABNORMAL HIGH (ref 70–99)
Potassium: 3.8 mEq/L (ref 3.5–5.1)
Total Bilirubin: 0.2 mg/dL — ABNORMAL LOW (ref 0.3–1.2)

## 2011-01-05 LAB — GLUCOSE, CAPILLARY: Glucose-Capillary: 335 mg/dL — ABNORMAL HIGH (ref 70–99)

## 2011-01-05 MED ORDER — PANTOPRAZOLE SODIUM 40 MG IV SOLR
40.0000 mg | Freq: Once | INTRAVENOUS | Status: AC
  Administered 2011-01-05: 40 mg via INTRAVENOUS
  Filled 2011-01-05: qty 40

## 2011-01-05 MED ORDER — KETOROLAC TROMETHAMINE 30 MG/ML IJ SOLN
30.0000 mg | Freq: Once | INTRAMUSCULAR | Status: AC
  Administered 2011-01-05: 30 mg via INTRAVENOUS
  Filled 2011-01-05: qty 1

## 2011-01-05 MED ORDER — GI COCKTAIL ~~LOC~~
30.0000 mL | Freq: Once | ORAL | Status: AC
  Administered 2011-01-05: 30 mL via ORAL
  Filled 2011-01-05: qty 30

## 2011-01-05 MED ORDER — SODIUM CHLORIDE 0.9 % IV BOLUS (SEPSIS)
1000.0000 mL | Freq: Once | INTRAVENOUS | Status: AC
  Administered 2011-01-05: 1000 mL via INTRAVENOUS

## 2011-01-05 NOTE — ED Notes (Signed)
PT. REPORTS ELEVATED BLOOD SUGAR AT HOME = "HIGH" THIS EVENING WITH VOMITTING . ETOH  THIS EVENING .

## 2011-01-05 NOTE — ED Provider Notes (Signed)
History     CSN: [id number] Arrival date & time: 01/05/2011  2:03 AM   First MD Initiated Contact with Patient 01/05/11 0446      Chief Complaint  Patient presents with  . Hyperglycemia     HPI  History provided by the patient. Patient with history of diabetes, hypertension, pancreatitis, alcohol abuse with many prior visits to the emergency room for similar complaints presents today with abdominal pain following alcohol use earlier this morning. Patient admits to drinking alcohol but is unable to quantify. He states his abdomen began hurting shortly after drinking and eating a meal. Pain was followed by episodes of nausea and vomiting. Patient denies any alleviating factors. Patient denies fever, chills, sweats. he denies diarrhea or constipation. Patient has history of similar episodes. Patient has no other significant past medical history.   Past Medical History  Diagnosis Date  . Diabetes mellitus   . Hypertension   . Seizures   . Pancreatitis     Past Surgical History  Procedure Date  . Appendectomy     History reviewed. No pertinent family history.  History  Substance Use Topics  . Smoking status: Former Smoker    Types: Cigarettes    Quit date: 12/21/1984  . Smokeless tobacco: Not on file  . Alcohol Use: Yes     heavy drinker: binge drinking per pt      Review of Systems  Constitutional: Negative for fever and chills.  Gastrointestinal: Positive for nausea, vomiting and abdominal pain. Negative for diarrhea and constipation.  All other systems reviewed and are negative.    Allergies  Morphine and related  Home Medications   Current Outpatient Rx  Name Route Sig Dispense Refill  . ACETAMINOPHEN 500 MG PO TABS Oral Take 1,000 mg by mouth every 6 (six) hours as needed. Equate brand arthritis pain med     . AMYLASE-LIPASE-PROTEASE 66.05-27-73 MU PO CPEP Oral Take 1 capsule by mouth 3 (three) times daily with meals. Usually eats 2 large meals per day      . GABAPENTIN 300 MG PO CAPS Oral Take 300 mg by mouth 3 (three) times daily.      Marland Kitchen HYDROCHLOROTHIAZIDE PO Oral Take 25 mg by mouth daily.     . IBUPROFEN 200 MG PO TABS Oral Take 200-400 mg by mouth every 6 (six) hours as needed. pain     . INSULIN ASPART 100 UNIT/ML [place] SOLN Subcutaneous Inject 14-18 Units into the skin 3 (three) times daily before meals. Based on sliding scale    . LANTUS [place] Subcutaneous Inject 40 Units into the skin at bedtime.     Marland Kitchen LISINOPRIL 10 MG PO TABS Oral Take 10 mg by mouth daily.      Marland Kitchen OMEPRAZOLE 20 MG PO CPDR Oral Take 1 capsule (20 mg total) by mouth daily. 20 capsule 0  . OXYCODONE-ACETAMINOPHEN 5-325 MG PO TABS Oral Take 1-2 tablets by mouth every 4 (four) hours as needed. pain     . PANTOPRAZOLE SODIUM 40 MG PO TBEC Oral Take 40 mg by mouth daily.      Marland Kitchen PHENYTOIN SODIUM EXTENDED 100 MG PO CAPS Oral Take 200 mg by mouth at bedtime.        BP 120/88  Pulse 113  Temp(Src) 97.4 F (36.3 C) (Oral)  Resp 20  SpO2 99%  Physical Exam  Vitals reviewed. Constitutional: He is oriented to person, place, and time. He appears well-developed and well-nourished. No distress.  HENT:  Head: Normocephalic  and atraumatic.  Right Ear: External ear normal.  Mouth/Throat: Oropharynx is clear and moist.  Eyes: Conjunctivae and EOM are normal. Pupils are equal, round, and reactive to light.  Cardiovascular: Normal rate, regular rhythm and normal heart sounds.   Pulmonary/Chest: Effort normal and breath sounds normal. No respiratory distress. He has no wheezes. He has no rales.  Abdominal: Soft. He exhibits distension. There is tenderness. There is no rebound and no guarding.       Diffuse tenderness  Neurological: He is alert and oriented to person, place, and time.  Skin: Skin is warm.  Psychiatric: He has a normal mood and affect.    ED Course  Procedures (including critical care time)  Labs Reviewed  GLUCOSE, CAPILLARY - Abnormal; Notable for the following:     Glucose-Capillary 335 (*)    All other components within normal limits  GLUCOSE, CAPILLARY - Abnormal; Notable for the following:    Glucose-Capillary 349 (*)    All other components within normal limits  CBC - Abnormal; Notable for the following:    HCT 38.6 (*)    All other components within normal limits  DIFFERENTIAL - Abnormal; Notable for the following:    Neutrophils Relative 25 (*)    Neutro Abs 1.2 (*)    Lymphocytes Relative 63 (*)    All other components within normal limits  GLUCOSE, CAPILLARY - Abnormal; Notable for the following:    Glucose-Capillary 294 (*)    All other components within normal limits  POCT CBG MONITORING  LIPASE, BLOOD  COMPREHENSIVE METABOLIC PANEL  POCT CBG MONITORING   Results for orders placed during the hospital encounter of 01/05/11  GLUCOSE, CAPILLARY      Component Value Range   Glucose-Capillary 335 (*) 70 - 99 (mg/dL)   Comment 1 Documented in Chart     Comment 2 Notify RN    GLUCOSE, CAPILLARY      Component Value Range   Glucose-Capillary 349 (*) 70 - 99 (mg/dL)  CBC      Component Value Range   WBC 4.6  4.0 - 10.5 (K/uL)   RBC 4.69  4.22 - 5.81 (MIL/uL)   Hemoglobin 13.6  13.0 - 17.0 (g/dL)   HCT 40.9 (*) 81.1 - 52.0 (%)   MCV 82.3  78.0 - 100.0 (fL)   MCH 29.0  26.0 - 34.0 (pg)   MCHC 35.2  30.0 - 36.0 (g/dL)   RDW 91.4  78.2 - 95.6 (%)   Platelets 210  150 - 400 (K/uL)  DIFFERENTIAL      Component Value Range   Neutrophils Relative 25 (*) 43 - 77 (%)   Neutro Abs 1.2 (*) 1.7 - 7.7 (K/uL)   Lymphocytes Relative 63 (*) 12 - 46 (%)   Lymphs Abs 2.9  0.7 - 4.0 (K/uL)   Monocytes Relative 6  3 - 12 (%)   Monocytes Absolute 0.3  0.1 - 1.0 (K/uL)   Eosinophils Relative 5  0 - 5 (%)   Eosinophils Absolute 0.2  0.0 - 0.7 (K/uL)   Basophils Relative 1  0 - 1 (%)   Basophils Absolute 0.1  0.0 - 0.1 (K/uL)  GLUCOSE, CAPILLARY      Component Value Range   Glucose-Capillary 294 (*) 70 - 99 (mg/dL)   Comment 1 Documented in  Chart     Comment 2 Notify RN        No results found.   No diagnosis found.    MDM  4:45  AM patient seen and evaluated. Patient asleep upon entering the room. Patient in no acute distress. Patient complaining of abdominal pain.    6:20 AM Pt having improvement of pain at this time. Pt discussed in sign out with [facility].  CMP and Lipase pending.  She will dispo based on labs.      Angus Seller, [place] 01/05/11 [phone]

## 2011-01-05 NOTE — ED Notes (Signed)
Report received, assumed care.  

## 2011-01-05 NOTE — ED Provider Notes (Signed)
Evaluation and management procedures were performed by the PA/NP under my supervision/collaboration.   Charvis Lightner, MD 01/05/11 0807 

## 2011-01-05 NOTE — ED Provider Notes (Signed)
History     CSN: [id number] Arrival date & time: 01/05/2011  2:03 AM   First MD Initiated Contact with Patient 01/05/11 0446      Chief Complaint  Patient presents with  . Hyperglycemia    (Consider location/radiation/quality/duration/timing/severity/associated sxs/prior treatment) HPI  Past Medical History  Diagnosis Date  . Diabetes mellitus   . Hypertension   . Seizures   . Pancreatitis     Past Surgical History  Procedure Date  . Appendectomy     History reviewed. No pertinent family history.  History  Substance Use Topics  . Smoking status: Former Smoker    Types: Cigarettes    Quit date: 12/21/1984  . Smokeless tobacco: Not on file  . Alcohol Use: Yes     heavy drinker: binge drinking per pt      Review of Systems  Allergies  Morphine and related  Home Medications   Current Outpatient Rx  Name Route Sig Dispense Refill  . ACETAMINOPHEN 500 MG PO TABS Oral Take 1,000 mg by mouth every 6 (six) hours as needed. Equate brand arthritis pain med     . AMYLASE-LIPASE-PROTEASE 66.05-27-73 MU PO CPEP Oral Take 1 capsule by mouth 3 (three) times daily with meals. Usually eats 2 large meals per day    . GABAPENTIN 300 MG PO CAPS Oral Take 300 mg by mouth 3 (three) times daily.      Marland Kitchen HYDROCHLOROTHIAZIDE PO Oral Take 25 mg by mouth daily.     . IBUPROFEN 200 MG PO TABS Oral Take 200-400 mg by mouth every 6 (six) hours as needed. pain     . INSULIN ASPART 100 UNIT/ML [place] SOLN Subcutaneous Inject 14-18 Units into the skin 3 (three) times daily before meals. Based on sliding scale    . LANTUS [place] Subcutaneous Inject 40 Units into the skin at bedtime.     Marland Kitchen LISINOPRIL 10 MG PO TABS Oral Take 10 mg by mouth daily.      Marland Kitchen OMEPRAZOLE 20 MG PO CPDR Oral Take 1 capsule (20 mg total) by mouth daily. 20 capsule 0  . OXYCODONE-ACETAMINOPHEN 5-325 MG PO TABS Oral Take 1-2 tablets by mouth every 4 (four) hours as needed. pain     . PANTOPRAZOLE SODIUM 40 MG PO TBEC Oral Take  40 mg by mouth daily.      Marland Kitchen PHENYTOIN SODIUM EXTENDED 100 MG PO CAPS Oral Take 200 mg by mouth at bedtime.        BP 131/88  Pulse 107  Temp(Src) 97.5 F (36.4 C) (Oral)  Resp 20  SpO2 100%  Physical Exam  ED Course  Procedures (including critical care time)  Labs Reviewed  GLUCOSE, CAPILLARY - Abnormal; Notable for the following:    Glucose-Capillary 335 (*)    All other components within normal limits  GLUCOSE, CAPILLARY - Abnormal; Notable for the following:    Glucose-Capillary 349 (*)    All other components within normal limits  CBC - Abnormal; Notable for the following:    HCT 38.6 (*)    All other components within normal limits  DIFFERENTIAL - Abnormal; Notable for the following:    Neutrophils Relative 25 (*)    Neutro Abs 1.2 (*)    Lymphocytes Relative 63 (*)    All other components within normal limits  COMPREHENSIVE METABOLIC PANEL - Abnormal; Notable for the following:    Glucose, Bld 290 (*)    Calcium 8.0 (*)    Albumin 3.2 (*)  Total Bilirubin 0.2 (*)    All other components within normal limits  GLUCOSE, CAPILLARY - Abnormal; Notable for the following:    Glucose-Capillary 294 (*)    All other components within normal limits  GLUCOSE, CAPILLARY - Abnormal; Notable for the following:    Glucose-Capillary 289 (*)    All other components within normal limits  LIPASE, BLOOD  POCT CBG MONITORING  POCT CBG MONITORING   No results found.   Chronic abdominal pain hyperglycemia   MDM  Patient requesting to leave - review of CMP and lipase without abn - will discharge to go to court - no evidence of acute issues        Scarlette Calico C. [place], [place] 01/05/11 [phone]

## 2011-01-05 NOTE — ED Provider Notes (Signed)
Evaluation and management procedures were performed by the PA/NP under my supervision/collaboration.   Dione Booze, MD 01/05/11 [phone]

## 2011-01-06 ENCOUNTER — Encounter (HOSPITAL_COMMUNITY): Payer: Self-pay

## 2011-01-06 ENCOUNTER — Emergency Department (HOSPITAL_COMMUNITY)
Admission: EM | Admit: 2011-01-06 | Discharge: 2011-01-07 | Discharge status: Against Medical Advice | Payer: Self-pay | Attending: Emergency Medicine | Admitting: Emergency Medicine

## 2011-01-06 ENCOUNTER — Emergency Department (HOSPITAL_COMMUNITY)
Admission: EM | Admit: 2011-01-06 | Discharge: 2011-01-06 | Disposition: A | Discharge status: Home / Self Care | Payer: Self-pay | Attending: Emergency Medicine | Admitting: Emergency Medicine

## 2011-01-06 ENCOUNTER — Encounter (HOSPITAL_COMMUNITY): Payer: Self-pay | Admitting: Emergency Medicine

## 2011-01-06 DIAGNOSIS — F1010 Alcohol abuse, uncomplicated: Secondary | ICD-10-CM | POA: Insufficient documentation

## 2011-01-06 DIAGNOSIS — L089 Local infection of the skin and subcutaneous tissue, unspecified: Secondary | ICD-10-CM | POA: Insufficient documentation

## 2011-01-06 DIAGNOSIS — Z79899 Other long term (current) drug therapy: Secondary | ICD-10-CM | POA: Insufficient documentation

## 2011-01-06 DIAGNOSIS — E119 Type 2 diabetes mellitus without complications: Secondary | ICD-10-CM | POA: Insufficient documentation

## 2011-01-06 DIAGNOSIS — I10 Essential (primary) hypertension: Secondary | ICD-10-CM | POA: Insufficient documentation

## 2011-01-06 DIAGNOSIS — R109 Unspecified abdominal pain: Secondary | ICD-10-CM | POA: Insufficient documentation

## 2011-01-06 HISTORY — DX: Alcohol abuse, uncomplicated: F10.10

## 2011-01-06 LAB — CBC
HCT: 41.5 % (ref 39.0–52.0)
Hemoglobin: 14.6 g/dL (ref 13.0–17.0)
Hemoglobin: 15.6 g/dL (ref 13.0–17.0)
MCH: 29.4 pg (ref 26.0–34.0)
MCH: 30.5 pg (ref 26.0–34.0)
MCV: 82.8 fL (ref 78.0–100.0)
RBC: 4.97 MIL/uL (ref 4.22–5.81)
RBC: 5.12 MIL/uL (ref 4.22–5.81)

## 2011-01-06 LAB — COMPREHENSIVE METABOLIC PANEL
ALT: 22 U/L (ref 0–53)
Alkaline Phosphatase: 97 U/L (ref 39–117)
CO2: 25 mEq/L (ref 19–32)
GFR calc Af Amer: >90 mL/min (ref >90)
GFR calc non Af Amer: >90 mL/min (ref >90)
Glucose, Bld: 374 mg/dL — ABNORMAL HIGH (ref 70–99)
Potassium: 4.3 mEq/L (ref 3.5–5.1)
Sodium: 137 mEq/L (ref 135–145)
Total Bilirubin: 0.2 mg/dL — ABNORMAL LOW (ref 0.3–1.2)

## 2011-01-06 LAB — URINE MICROSCOPIC-ADD ON

## 2011-01-06 LAB — URINALYSIS, ROUTINE W REFLEX MICROSCOPIC
Bilirubin Urine: NEGATIVE
Ketones, ur: NEGATIVE mg/dL
Nitrite: NEGATIVE
Urobilinogen, UA: 0.2 mg/dL (ref 0.0–1.0)

## 2011-01-06 LAB — DIFFERENTIAL
Eosinophils Absolute: 0.5 10*3/uL (ref 0.0–0.7)
Lymphs Abs: 4.2 10*3/uL — ABNORMAL HIGH (ref 0.7–4.0)
Monocytes Relative: 5 % (ref 3–12)
Neutro Abs: 1.2 10*3/uL — ABNORMAL LOW (ref 1.7–7.7)
Neutrophils Relative %: 19 % — ABNORMAL LOW (ref 43–77)

## 2011-01-06 MED ORDER — TRIAMCINOLONE ACETONIDE 0.1 % EX CREA: TOPICAL_CREAM | Freq: Two times a day (BID) | CUTANEOUS | Status: DC

## 2011-01-06 MED ORDER — SODIUM CHLORIDE 0.9 % IV BOLUS (SEPSIS): 2000.0000 mL | Freq: Once | INTRAVENOUS | Status: DC

## 2011-01-06 MED ORDER — ONDANSETRON HCL 4 MG/2ML IJ SOLN: 4.0000 mg | Freq: Once | INTRAMUSCULAR | Status: DC

## 2011-01-06 MED ORDER — FENTANYL CITRATE 0.05 MG/ML IJ SOLN: 100.0000 ug | Freq: Once | INTRAMUSCULAR | Status: DC

## 2011-01-06 NOTE — ED Provider Notes (Signed)
History     CSN: [id number] Arrival date & time: 01/06/2011  2:08 AM   First MD Initiated Contact with Patient 01/06/11 [phone]      Chief Complaint  Patient presents with  . Pancreatitis  . Alcohol Intoxication    (Consider location/radiation/quality/duration/timing/severity/associated sxs/prior treatment) HPI Patient is here to be evaluated for an area on his arm and he states that he was having some abdominal pain earlier, but is now feeling, no abdominal pain.  Patient states, that he is at this area on his arm that started off as a small and a bump and now is gotten larger and more irritated.  Patient admits to drinking heavy amounts of alcohol last night.  He is very cooperative during this history of present illness interview.  She states she has no other complaints other than his arm at this time. Past Medical History  Diagnosis Date  . Diabetes mellitus   . Hypertension   . Seizures   . Pancreatitis     Past Surgical History  Procedure Date  . Appendectomy     History reviewed. No pertinent family history.  History  Substance Use Topics  . Smoking status: Former Smoker    Types: Cigarettes    Quit date: 12/21/1984  . Smokeless tobacco: Not on file  . Alcohol Use: Yes     heavy drinker: binge drinking per pt      Review of Systems All pertinent positives and negatives in the history of present illness  Allergies  Morphine and related  Home Medications   Current Outpatient Rx  Name Route Sig Dispense Refill  . ACETAMINOPHEN 500 MG PO TABS Oral Take 1,000 mg by mouth every 6 (six) hours as needed. Equate brand arthritis pain med     . AMYLASE-LIPASE-PROTEASE 66.05-27-73 MU PO CPEP Oral Take 1 capsule by mouth 3 (three) times daily with meals. Usually eats 2 large meals per day    . GABAPENTIN 300 MG PO CAPS Oral Take 300 mg by mouth 3 (three) times daily.      Marland Kitchen HYDROCHLOROTHIAZIDE PO Oral Take 25 mg by mouth daily.     . IBUPROFEN 200 MG PO TABS Oral Take  200-400 mg by mouth every 6 (six) hours as needed. pain     . INSULIN ASPART 100 UNIT/ML [place] SOLN Subcutaneous Inject 14-18 Units into the skin 3 (three) times daily before meals. Based on sliding scale    . LANTUS [place] Subcutaneous Inject 40 Units into the skin at bedtime.     Marland Kitchen LISINOPRIL 10 MG PO TABS Oral Take 10 mg by mouth daily.      Marland Kitchen OMEPRAZOLE 20 MG PO CPDR Oral Take 1 capsule (20 mg total) by mouth daily. 20 capsule 0  . OXYCODONE-ACETAMINOPHEN 5-325 MG PO TABS Oral Take 1-2 tablets by mouth every 4 (four) hours as needed. pain     . PANTOPRAZOLE SODIUM 40 MG PO TBEC Oral Take 40 mg by mouth daily.      Marland Kitchen PHENYTOIN SODIUM EXTENDED 100 MG PO CAPS Oral Take 200 mg by mouth at bedtime.        BP 132/94  Pulse 114  Temp(Src) 97.5 F (36.4 C) (Oral)  Resp 24  SpO2 100%  Physical Exam  Constitutional: He is oriented to person, place, and time. He appears well-developed and well-nourished. No distress.  Eyes: Pupils are equal, round, and reactive to light.  Neck: Normal range of motion.  Cardiovascular: Normal rate, regular rhythm and normal  heart sounds.   Pulmonary/Chest: Effort normal and breath sounds normal.  Abdominal: Soft. Bowel sounds are normal. There is no tenderness.  Neurological: He is alert and oriented to person, place, and time.  Skin: Skin is warm and dry.       ED Course  Procedures (including critical care time)  Labs Reviewed  DIFFERENTIAL - Abnormal; Notable for the following:    Neutrophils Relative 19 (*)    Neutro Abs 1.2 (*)    Lymphocytes Relative 67 (*)    Lymphs Abs 4.2 (*)    Eosinophils Relative 7 (*)    Basophils Relative 2 (*)    All other components within normal limits  COMPREHENSIVE METABOLIC PANEL - Abnormal; Notable for the following:    Glucose, Bld 374 (*)    Total Bilirubin 0.2 (*)    All other components within normal limits  URINALYSIS, ROUTINE W REFLEX MICROSCOPIC - Abnormal; Notable for the following:    Glucose, UA >1000  (*)    All other components within normal limits  CBC  LIPASE, BLOOD  URINE MICROSCOPIC-ADD ON   Patient be treated for this area on his forearm with triamcinolone cream and referred to dermatology.  Patient is to return here for any worsening in his condition.  The patient's ride is here to pick him up and he would like to leave the emergency department, at this time.  Patient is advised.  His blood sugar is elevated and he would need to take his normal medications for his diabetes.       MDM          Carlyle Dolly, PA 01/06/11 [phone]

## 2011-01-06 NOTE — ED Notes (Signed)
PT. ARRIVED WITH EMS FROM STREET - INTOXICATED WITH ETOH , REPORTS NAUSEA ND VOMITTING WITH ABDOMINAL CRAMPING . AMBULATORY.

## 2011-01-06 NOTE — ED Notes (Signed)
Pt picked up by EMS for ETOH and abd pain due to pancreatitis

## 2011-01-06 NOTE — ED Provider Notes (Signed)
Medical screening examination/treatment/procedure(s) were conducted as a shared visit with non-physician practitioner(s) and myself.  I personally evaluated the patient during the encounter  Pt seen by me as well as PA.  Pt with known h/o alcoholism, chronic abd pain.  Pt has sobered, and now has ride, wishes to go home.  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  Will prescribe steroid cream, and pt strongly encouraged to f/u with dermatology consult given on d/c paperwork  Olivia Mackie, MD 01/06/11 [phone]

## 2011-01-07 ENCOUNTER — Emergency Department (HOSPITAL_COMMUNITY): Payer: Self-pay

## 2011-01-07 ENCOUNTER — Emergency Department (HOSPITAL_COMMUNITY)
Admission: EM | Admit: 2011-01-07 | Discharge: 2011-01-07 | Disposition: A | Discharge status: Home / Self Care | Payer: Self-pay | Attending: Emergency Medicine | Admitting: Emergency Medicine

## 2011-01-07 ENCOUNTER — Encounter (HOSPITAL_COMMUNITY): Payer: Self-pay | Admitting: Emergency Medicine

## 2011-01-07 DIAGNOSIS — Z79899 Other long term (current) drug therapy: Secondary | ICD-10-CM | POA: Insufficient documentation

## 2011-01-07 DIAGNOSIS — Z794 Long term (current) use of insulin: Secondary | ICD-10-CM | POA: Insufficient documentation

## 2011-01-07 DIAGNOSIS — R000 Tachycardia, unspecified: Secondary | ICD-10-CM | POA: Insufficient documentation

## 2011-01-07 DIAGNOSIS — R1013 Epigastric pain: Secondary | ICD-10-CM | POA: Insufficient documentation

## 2011-01-07 DIAGNOSIS — F1010 Alcohol abuse, uncomplicated: Secondary | ICD-10-CM | POA: Insufficient documentation

## 2011-01-07 DIAGNOSIS — E119 Type 2 diabetes mellitus without complications: Secondary | ICD-10-CM | POA: Insufficient documentation

## 2011-01-07 DIAGNOSIS — R10816 Epigastric abdominal tenderness: Secondary | ICD-10-CM | POA: Insufficient documentation

## 2011-01-07 DIAGNOSIS — R112 Nausea with vomiting, unspecified: Secondary | ICD-10-CM | POA: Insufficient documentation

## 2011-01-07 DIAGNOSIS — F10929 Alcohol use, unspecified with intoxication, unspecified: Secondary | ICD-10-CM

## 2011-01-07 DIAGNOSIS — I10 Essential (primary) hypertension: Secondary | ICD-10-CM | POA: Insufficient documentation

## 2011-01-07 LAB — ETHANOL: Alcohol, Ethyl (B): 289 mg/dL — ABNORMAL HIGH (ref 0–11)

## 2011-01-07 LAB — COMPREHENSIVE METABOLIC PANEL
ALT: 29 U/L (ref 0–53)
Alkaline Phosphatase: 115 U/L (ref 39–117)
CO2: 20 mEq/L (ref 19–32)
Calcium: 8.9 mg/dL (ref 8.4–10.5)
Chloride: 96 mEq/L (ref 96–112)
GFR calc Af Amer: >90 mL/min (ref >90)
GFR calc non Af Amer: >90 mL/min (ref >90)
Glucose, Bld: 384 mg/dL — ABNORMAL HIGH (ref 70–99)
Sodium: 138 mEq/L (ref 135–145)
Total Bilirubin: 0.3 mg/dL (ref 0.3–1.2)

## 2011-01-07 LAB — CBC
Hemoglobin: 15.7 g/dL (ref 13.0–17.0)
MCHC: 36.4 g/dL — ABNORMAL HIGH (ref 30.0–36.0)
Platelets: 255 10*3/uL (ref 150–400)
RDW: 12.4 % (ref 11.5–15.5)

## 2011-01-07 LAB — DIFFERENTIAL
Basophils Absolute: 0.1 10*3/uL (ref 0.0–0.1)
Basophils Relative: 2 % — ABNORMAL HIGH (ref 0–1)
Eosinophils Absolute: 0.2 10*3/uL (ref 0.0–0.7)
Neutro Abs: 2.5 10*3/uL (ref 1.7–7.7)
Neutrophils Relative %: 42 % — ABNORMAL LOW (ref 43–77)

## 2011-01-07 LAB — BASIC METABOLIC PANEL
CO2: 22 mEq/L (ref 19–32)
Calcium: 8.9 mg/dL (ref 8.4–10.5)
Glucose, Bld: 374 mg/dL — ABNORMAL HIGH (ref 70–99)
Sodium: 137 mEq/L (ref 135–145)

## 2011-01-07 LAB — LIPASE, BLOOD: Lipase: 10 U/L — ABNORMAL LOW (ref 11–59)

## 2011-01-07 MED ORDER — LORAZEPAM 2 MG/ML IJ SOLN
1.0000 mg | Freq: Once | INTRAMUSCULAR | Status: AC
  Administered 2011-01-07: 1 mg via INTRAVENOUS
  Filled 2011-01-07: qty 1

## 2011-01-07 MED ORDER — HYDROCODONE-ACETAMINOPHEN 5-500 MG PO TABS: 1.0000 | ORAL_TABLET | Freq: Four times a day (QID) | ORAL | Status: DC | PRN

## 2011-01-07 MED ORDER — ONDANSETRON HCL 4 MG/2ML IJ SOLN
4.0000 mg | Freq: Once | INTRAMUSCULAR | Status: AC
  Administered 2011-01-07: 4 mg via INTRAVENOUS
  Filled 2011-01-07: qty 2

## 2011-01-07 MED ORDER — FAMOTIDINE IN NACL 20-0.9 MG/50ML-% IV SOLN
20.0000 mg | Freq: Once | INTRAVENOUS | Status: AC
  Administered 2011-01-07: 20 mg via INTRAVENOUS
  Filled 2011-01-07: qty 50

## 2011-01-07 MED ORDER — FENTANYL CITRATE 0.05 MG/ML IJ SOLN
100.0000 ug | Freq: Once | INTRAMUSCULAR | Status: AC
  Administered 2011-01-07: 100 ug via INTRAVENOUS
  Filled 2011-01-07: qty 2

## 2011-01-07 MED ORDER — SODIUM CHLORIDE 0.9 % IV BOLUS (SEPSIS)
1000.0000 mL | Freq: Once | INTRAVENOUS | Status: AC
  Administered 2011-01-07: 1000 mL via INTRAVENOUS

## 2011-01-07 MED ORDER — ONDANSETRON HCL 4 MG PO TABS: 8.0000 mg | ORAL_TABLET | Freq: Three times a day (TID) | ORAL | Status: AC | PRN

## 2011-01-07 MED ORDER — SODIUM CHLORIDE 0.9 % IV SOLN
Freq: Once | INTRAVENOUS | Status: AC
  Administered 2011-01-07: 08:00:00 via INTRAVENOUS

## 2011-01-07 NOTE — ED Notes (Signed)
abd pain states has pancreatitis and drank alcohol last night and woke with abd pain.

## 2011-01-07 NOTE — ED Provider Notes (Signed)
History    CSN: [id number] Arrival date & time: 01/07/2011  7:25 AM   First MD Initiated Contact with Patient 01/07/11 [phone]      Chief Complaint  Patient presents with  . Pancreatitis    states has panceatitis and drank alcohol last night and has had generalized abd pain since woke this morning.   HPI Patient presents to emergency room with complaint of epigastric abdominal pain. Patient has history of alcoholic induced pancreatitis. Patient states that he's been drinking the last couple days. Patient reports associated nausea and vomiting. Patient states that his last drink was a few days ago. Patient reports some nausea and vomiting. Denies any diarrhea. Denies any rectal bleeding. Denies any trauma or falls.   Past Medical History  Diagnosis Date  . Diabetes mellitus   . Hypertension   . Seizures   . Pancreatitis   . Alcohol abuse     Past Surgical History  Procedure Date  . Appendectomy     History reviewed. No pertinent family history.  History  Substance Use Topics  . Smoking status: Former Smoker    Types: Cigarettes    Quit date: 12/21/1984  . Smokeless tobacco: Not on file  . Alcohol Use: Yes     heavy drinker: binge drinking per pt      Review of Systems  Constitutional: Negative for fever, chills, diaphoresis and appetite change.  HENT: Negative for neck pain.   Eyes: Negative for photophobia and visual disturbance.  Respiratory: Negative for cough, chest tightness and shortness of breath.   Cardiovascular: Negative for chest pain.  Gastrointestinal: Positive for abdominal pain. Negative for nausea, vomiting, diarrhea, constipation, blood in stool, abdominal distention, anal bleeding and rectal pain.  Genitourinary: Negative for dysuria, flank pain, penile swelling, scrotal swelling, difficulty urinating, penile pain and testicular pain.  Musculoskeletal: Negative for back pain.  Skin: Negative for rash.  Neurological: Negative for weakness and  numbness.  All other systems reviewed and are negative.    Allergies  Morphine and related  Home Medications   Current Outpatient Rx  Name Route Sig Dispense Refill  . ACETAMINOPHEN 500 MG PO TABS Oral Take 1,000 mg by mouth every 6 (six) hours as needed. For pain    . AMYLASE-LIPASE-PROTEASE 66.05-27-73 MU PO CPEP Oral Take 1 capsule by mouth 3 (three) times daily with meals. Usually eats 2 large meals per day    . GABAPENTIN 300 MG PO CAPS Oral Take 300 mg by mouth 3 (three) times daily.      Marland Kitchen HYDROCHLOROTHIAZIDE PO Oral Take 25 mg by mouth daily.     . IBUPROFEN 200 MG PO TABS Oral Take 200-400 mg by mouth every 6 (six) hours as needed. pain     . INSULIN ASPART 100 UNIT/ML [place] SOLN Subcutaneous Inject 14-18 Units into the skin 3 (three) times daily before meals. Based on sliding scale    . LANTUS [place] Subcutaneous Inject 40 Units into the skin at bedtime.     Marland Kitchen LISINOPRIL 10 MG PO TABS Oral Take 10 mg by mouth daily.      Marland Kitchen OMEPRAZOLE 20 MG PO CPDR Oral Take 1 capsule (20 mg total) by mouth daily. 20 capsule 0  . OXYCODONE-ACETAMINOPHEN 5-325 MG PO TABS Oral Take 1-2 tablets by mouth every 4 (four) hours as needed. pain     . PANTOPRAZOLE SODIUM 40 MG PO TBEC Oral Take 40 mg by mouth daily.      Marland Kitchen PHENYTOIN SODIUM EXTENDED 100  MG PO CAPS Oral Take 200 mg by mouth at bedtime.      . TRIAMCINOLONE ACETONIDE 0.1 % EX CREA Topical Apply topically 2 (two) times daily. 30 g 0    BP 146/106  Pulse 120  Temp(Src) 97.8 F (36.6 C) (Oral)  SpO2 100%  Physical Exam  Nursing note and vitals reviewed. Constitutional: He is oriented to person, place, and time. He appears well-developed and well-nourished. No distress.  HENT:  Head: Normocephalic and atraumatic.  Neck: Normal range of motion. Neck supple.  Cardiovascular: Regular rhythm.  Exam reveals no gallop and no friction rub.   No murmur heard.      Tachycardic.  Pulmonary/Chest: Effort normal and breath sounds normal. No  respiratory distress. He has no rales. He exhibits no tenderness.  Abdominal: Soft. He exhibits no distension and no mass. There is tenderness. There is no rebound and no guarding.       Epigastric tenderness. No RLQ tenderness. Normal bowel sounds. No bruising noted. Prior appendectomy scar. Negative murphy's sign. No rebound tenderness or guarding.   Musculoskeletal: Normal range of motion.  Neurological: He is alert and oriented to person, place, and time. No cranial nerve deficit. Coordination normal.       Patient appears intoxicated.  Skin: Skin is warm and dry. He is not diaphoretic.  Psychiatric: He has a normal mood and affect. His behavior is normal. Judgment and thought content normal.    ED Course  Procedures (including critical care time)  Patient seen and evaluated.  VSS reviewed. . Nursing notes reviewed. Discussed with attending physician, Dr. Lynelle Doctor. Initial testing ordered. Will monitor the patient closely. They agree with the treatment plan and diagnosis. Patient tachycardic and uncomfortably, alcohol level elevated. Possible alcohol withdrawal, no DTs. Ativan given IV. Will continue to monitor closely. Likely where the elevated vs, are from. Pain controlled at this time. Pending ct scan.   12:31 PM Patient seen and re-evaluated. Resting comfortably. NAD. Pending CT scan results. Patient had hyperglycemia, did not take his insulin this morning.   Patient seen and re-evaluated. Resting comfortably. VSS stable. NAD. Patient notified of testing results. Stated agreement and understanding. Patient stated understanding to treatment plan and diagnosis.   Results for orders placed during the hospital encounter of 01/07/11  COMPREHENSIVE METABOLIC PANEL      Component Value Range   Sodium 138  135 - 145 (mEq/L)   Potassium 4.1  3.5 - 5.1 (mEq/L)   Chloride 96  96 - 112 (mEq/L)   CO2 20  19 - 32 (mEq/L)   Glucose, Bld 384 (*) 70 - 99 (mg/dL)   BUN 13  6 - 23 (mg/dL)   Creatinine,  Ser 1.61  0.50 - 1.35 (mg/dL)   Calcium 8.9  8.4 - 09.6 (mg/dL)   Total Protein 8.5 (*) 6.0 - 8.3 (g/dL)   Albumin 4.1  3.5 - 5.2 (g/dL)   AST 40 (*) 0 - 37 (U/L)   ALT 29  0 - 53 (U/L)   Alkaline Phosphatase 115  39 - 117 (U/L)   Total Bilirubin 0.3  0.3 - 1.2 (mg/dL)   GFR calc non Af Amer >90  >90 (mL/min)   GFR calc Af Amer >90  >90 (mL/min)  CBC      Component Value Range   WBC 6.0  4.0 - 10.5 (K/uL)   RBC 5.24  4.22 - 5.81 (MIL/uL)   Hemoglobin 15.7  13.0 - 17.0 (g/dL)   HCT 04.5  40.9 -  52.0 (%)   MCV 82.3  78.0 - 100.0 (fL)   MCH 30.0  26.0 - 34.0 (pg)   MCHC 36.4 (*) 30.0 - 36.0 (g/dL)   RDW 46.9  62.9 - 52.8 (%)   Platelets 255  150 - 400 (K/uL)  DIFFERENTIAL      Component Value Range   Neutrophils Relative 42 (*) 43 - 77 (%)   Neutro Abs 2.5  1.7 - 7.7 (K/uL)   Lymphocytes Relative 48 (*) 12 - 46 (%)   Lymphs Abs 2.9  0.7 - 4.0 (K/uL)   Monocytes Relative 5  3 - 12 (%)   Monocytes Absolute 0.3  0.1 - 1.0 (K/uL)   Eosinophils Relative 4  0 - 5 (%)   Eosinophils Absolute 0.2  0.0 - 0.7 (K/uL)   Basophils Relative 2 (*) 0 - 1 (%)   Basophils Absolute 0.1  0.0 - 0.1 (K/uL)  LIPASE, BLOOD      Component Value Range   Lipase 10 (*) 11 - 59 (U/L)  ETHANOL      Component Value Range   Alcohol, Ethyl (B) 289 (*) 0 - 11 (mg/dL)  GLUCOSE, CAPILLARY      Component Value Range   Glucose-Capillary 279 (*) 70 - 99 (mg/dL)   Ct Abdomen Pelvis W Contrast  01/07/2011  *RADIOLOGY REPORT*  Clinical Data: Epigastric abdominal pain.  Pancreatitis.  CT ABDOMEN AND PELVIS WITH CONTRAST  Technique:  Multidetector CT imaging of the abdomen and pelvis was performed following the standard protocol during bolus administration of intravenous contrast.  Contrast:  100 ml Omnipaque-300 intravenously.  Comparison: Prior CTs 12/15/2010 and 10/23/2010.  Findings: The lung bases are clear.  There is no pleural effusion. Diffuse hepatic steatosis is again demonstrated.  There is no biliary  dilatation.  The gallbladder appears unremarkable.  Again demonstrated is a thick-walled pseudocyst involving the pancreatic body and tail.  This measures 3.6 x 4.6 cm (image 26) compared with 3.5 x 4.7 cm previously.  The pancreas is atrophied. No definite acute inflammatory changes or new surrounding fluid collections are identified.  The splenic vein is not optimally visualized on the current examination due to motion.  However, its appearance appears unchanged.  The portal and superior mesenteric veins are patent.  The spleen, adrenal glands and kidneys appear normal.  The bowel gas pattern is normal.  There is no ascites or adenopathy.  The bladder, prostate gland and seminal vesicles appear stable.  No acute osseous findings are identified.  IMPRESSION:  1.  No acute abdominal pelvic findings identified. 2.  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  No definite acute superimposed inflammatory change. 3.  Stable hepatic steatosis.  Original Report Authenticated By: Gerrianne Scale, M.D.    Date: 01/07/2011, 8:20  Rate: 119  Rhythm: sinus tachycardia  QRS Axis: normal  Intervals: normal  ST/T Wave abnormalities: normal  Conduction Disutrbances:none  Narrative Interpretation:   Old EKG Reviewed: unchanged, q-waves inferior   1:21 PM blood sugar down trending, after multiple boluses, advised patient to take his insulin when he gets home. States agreement and understanding. Patient passed po fluid trial without problems.   1:37 PM dicussed tachycardia with Dr. Lynelle Doctor, advised another gram of ativan. Patient denies any DTs or history of seizures. Patient has been here for over 6 hours, no seizures. No shaking, when he sticks out his tongue it does not move. States he feels better, ambulated without problem or assist. Discussed extensively  the treatment plan. Stated agreement and understanding. States that his abdominal pain has resolved and passed po fluid  challenge.   MDM  Alcohol intoxication Abdominal pain        Demetrius Charity, [place] 01/07/11 1411

## 2011-01-07 NOTE — ED Notes (Signed)
cbg was 297

## 2011-01-07 NOTE — ED Notes (Signed)
cbg was 279...not 297

## 2011-01-07 NOTE — ED Notes (Signed)
Pt called girlfriend to come get him.  Pt discharged home, given instructions and states understanding.  Denies further questions or needs at present.

## 2011-01-07 NOTE — ED Notes (Signed)
Pt tolerated po fluids well. No distress. Denies c/o.

## 2011-01-07 NOTE — ED Provider Notes (Signed)
Medical screening examination/treatment/procedure(s) were conducted as a shared visit with non-physician practitioner(s) and myself.  I personally evaluated the patient during the encounter Devoria Albe, MD, Franz Dell, MD 01/07/11 1550

## 2011-01-07 NOTE — ED Notes (Signed)
Pt completed po contrast, ct called and aware.

## 2011-01-07 NOTE — ED Notes (Signed)
Pt encouraged to finish second cup of contrast. First cup is finished.

## 2011-01-15 DIAGNOSIS — Z794 Long term (current) use of insulin: Secondary | ICD-10-CM | POA: Insufficient documentation

## 2011-01-15 DIAGNOSIS — R10819 Abdominal tenderness, unspecified site: Secondary | ICD-10-CM | POA: Insufficient documentation

## 2011-01-15 DIAGNOSIS — G8929 Other chronic pain: Secondary | ICD-10-CM | POA: Insufficient documentation

## 2011-01-15 DIAGNOSIS — E119 Type 2 diabetes mellitus without complications: Secondary | ICD-10-CM | POA: Insufficient documentation

## 2011-01-15 DIAGNOSIS — R109 Unspecified abdominal pain: Secondary | ICD-10-CM | POA: Insufficient documentation

## 2011-01-15 DIAGNOSIS — I10 Essential (primary) hypertension: Secondary | ICD-10-CM | POA: Insufficient documentation

## 2011-01-16 ENCOUNTER — Emergency Department (HOSPITAL_COMMUNITY)
Admission: EM | Admit: 2011-01-16 | Discharge: 2011-01-16 | Disposition: A | Discharge status: Home / Self Care | Payer: Self-pay | Attending: Emergency Medicine | Admitting: Emergency Medicine

## 2011-01-16 ENCOUNTER — Encounter (HOSPITAL_COMMUNITY): Payer: Self-pay | Admitting: *Deleted

## 2011-01-16 DIAGNOSIS — G8929 Other chronic pain: Secondary | ICD-10-CM

## 2011-01-16 LAB — COMPREHENSIVE METABOLIC PANEL
Alkaline Phosphatase: 90 U/L (ref 39–117)
BUN: 11 mg/dL (ref 6–23)
CO2: 22 mEq/L (ref 19–32)
Chloride: 95 mEq/L — ABNORMAL LOW (ref 96–112)
GFR calc Af Amer: >90 mL/min (ref >90)
GFR calc non Af Amer: >90 mL/min (ref >90)
Glucose, Bld: 344 mg/dL — ABNORMAL HIGH (ref 70–99)
Potassium: 3.6 mEq/L (ref 3.5–5.1)
Total Bilirubin: 0.2 mg/dL — ABNORMAL LOW (ref 0.3–1.2)
Total Protein: 7.8 g/dL (ref 6.0–8.3)

## 2011-01-16 LAB — DIFFERENTIAL
Eosinophils Absolute: 0 10*3/uL (ref 0.0–0.7)
Lymphs Abs: 3 10*3/uL (ref 0.7–4.0)
Monocytes Relative: 5 % (ref 3–12)
Neutrophils Relative %: 51 % (ref 43–77)

## 2011-01-16 LAB — CBC
Hemoglobin: 14 g/dL (ref 13.0–17.0)
MCH: 29.1 pg (ref 26.0–34.0)
RBC: 4.81 MIL/uL (ref 4.22–5.81)

## 2011-01-16 LAB — GLUCOSE, CAPILLARY

## 2011-01-16 LAB — LIPASE, BLOOD: Lipase: 16 U/L (ref 11–59)

## 2011-01-16 MED ORDER — PANTOPRAZOLE SODIUM 40 MG PO TBEC
40.0000 mg | DELAYED_RELEASE_TABLET | Freq: Once | ORAL | Status: AC
  Administered 2011-01-16: 40 mg via ORAL
  Filled 2011-01-16: qty 1

## 2011-01-16 MED ORDER — GI COCKTAIL ~~LOC~~
30.0000 mL | Freq: Once | ORAL | Status: AC
  Administered 2011-01-16: 30 mL via ORAL
  Filled 2011-01-16: qty 30

## 2011-01-16 MED ORDER — FAMOTIDINE 20 MG PO TABS
20.0000 mg | ORAL_TABLET | Freq: Once | ORAL | Status: AC
  Administered 2011-01-16: 20 mg via ORAL
  Filled 2011-01-16: qty 1

## 2011-01-16 NOTE — ED Provider Notes (Signed)
Medical screening examination/treatment/procedure(s) were performed by non-physician practitioner and as supervising physician I was immediately available for consultation/collaboration.   Dayton Bailiff, MD 01/16/11 [phone]

## 2011-01-16 NOTE — ED Notes (Signed)
Pt given discharge instructions and verbalizes understanding  

## 2011-01-16 NOTE — ED Notes (Signed)
Pt with abd pain.  HX pancreatitis/alcohol.  Pt states midnight started with sharp pain. Pt drank tonight and pain started shortly after. Pt states slightly nauseated also.

## 2011-01-16 NOTE — ED Provider Notes (Signed)
History     CSN: [id number] Arrival date & time: 01/16/2011 12:36 AM   First MD Initiated Contact with Patient 01/16/11 0445      Chief Complaint  Patient presents with  . Abdominal Pain    HPI  History provided by the patient. Patient with hx of DM, pancreatitis, and EtOH abuse who presents with complaints of abdominal pain and some episodes of vomiting earlier tonight. Pt has multiple visits to the ED for similar complaints.  Patient states he feels like his is similar to his chronic pains. Patient admits to drinking vodka last night. Symptoms began around 11 PM. Symptoms are worse with eating or drinking.  he denies any fever chills sweats. He denies any constipation or diarrhea.  He denies CP, SOB, or heart palpitations.   Past Medical History  Diagnosis Date  . Diabetes mellitus   . Hypertension   . Seizures   . Pancreatitis   . Alcohol abuse     Past Surgical History  Procedure Date  . Appendectomy     History reviewed. No pertinent family history.  History  Substance Use Topics  . Smoking status: Former Smoker    Types: Cigarettes    Quit date: 12/21/1984  . Smokeless tobacco: Not on file  . Alcohol Use: Yes     heavy drinker: binge drinking per pt      Review of Systems  Constitutional: Negative for fever and chills.  Respiratory: Negative for cough and shortness of breath.   Cardiovascular: Negative for chest pain.  Gastrointestinal: Positive for vomiting and abdominal pain. Negative for diarrhea and constipation.  All other systems reviewed and are negative.    Allergies  Morphine and related  Home Medications   Current Outpatient Rx  Name Route Sig Dispense Refill  . ACETAMINOPHEN 500 MG PO TABS Oral Take 1,000 mg by mouth every 6 (six) hours as needed. For pain    . AMYLASE-LIPASE-PROTEASE 66.05-27-73 MU PO CPEP Oral Take 1 capsule by mouth 3 (three) times daily with meals. Usually eats 2 large meals per day    . CEPHALEXIN 500 MG PO CAPS Oral  Take 500 mg by mouth 4 (four) times daily.      Marland Kitchen GABAPENTIN 300 MG PO CAPS Oral Take 300 mg by mouth 3 (three) times daily.      Marland Kitchen HYDROCHLOROTHIAZIDE PO Oral Take 25 mg by mouth daily.     . IBUPROFEN 200 MG PO TABS Oral Take 200-400 mg by mouth every 6 (six) hours as needed. pain     . INSULIN ASPART 100 UNIT/ML [place] SOLN Subcutaneous Inject 14-18 Units into the skin 3 (three) times daily before meals. Based on sliding scale    . LANTUS [place] Subcutaneous Inject 40 Units into the skin at bedtime.     Marland Kitchen LISINOPRIL 10 MG PO TABS Oral Take 10 mg by mouth daily.      Marland Kitchen OMEPRAZOLE 20 MG PO CPDR Oral Take 1 capsule (20 mg total) by mouth daily. 20 capsule 0  . OXYCODONE-ACETAMINOPHEN 5-325 MG PO TABS Oral Take 1-2 tablets by mouth every 4 (four) hours as needed. pain     . PANTOPRAZOLE SODIUM 40 MG PO TBEC Oral Take 40 mg by mouth daily.      Marland Kitchen PHENYTOIN SODIUM EXTENDED 100 MG PO CAPS Oral Take 200 mg by mouth at bedtime.      Marland Kitchen PREDNISONE 10 MG PO TABS Oral Take 10 mg by mouth daily. Tapered dosing starting with 6  tabs for three days then decreasing by one tablet every three days until at zero tabs     . TRIAMCINOLONE ACETONIDE 0.1 % EX CREA Topical Apply topically 2 (two) times daily. 30 g 0    BP 109/59  Pulse 114  Temp(Src) 97.9 F (36.6 C) (Oral)  Resp 17  Ht 5\' 9"  (1.753 m)  Wt 225 lb (102.059 kg)  BMI 33.23 kg/m2  SpO2 98%  Physical Exam  Nursing note and vitals reviewed. Constitutional: He is oriented to person, place, and time. He appears well-developed and well-nourished.  HENT:  Head: Normocephalic.  Mouth/Throat: Oropharynx is clear and moist.  Eyes: Pupils are equal, round, and reactive to light.  Cardiovascular: Normal rate, regular rhythm and normal heart sounds.   Pulmonary/Chest: Effort normal and breath sounds normal. He has no wheezes. He has no rales.  Abdominal: Soft. Bowel sounds are normal. There is tenderness. There is no rebound and no guarding.  Musculoskeletal:  Normal range of motion.  Neurological: He is alert and oriented to person, place, and time.  Skin: Skin is warm.  Psychiatric: He has a normal mood and affect. His behavior is normal.    ED Course  Procedures (including critical care time)  Labs Reviewed  GLUCOSE, CAPILLARY - Abnormal; Notable for the following:    Glucose-Capillary 379 (*)    All other components within normal limits  POCT CBG MONITORING   Results for orders placed during the hospital encounter of 01/16/11  GLUCOSE, CAPILLARY      Component Value Range   Glucose-Capillary 379 (*) 70 - 99 (mg/dL)   Comment 1 Documented in Chart     Comment 2 Notify RN    CBC      Component Value Range   WBC 6.8  4.0 - 10.5 (K/uL)   RBC 4.81  4.22 - 5.81 (MIL/uL)   Hemoglobin 14.0  13.0 - 17.0 (g/dL)   HCT 16.1 (*) 09.6 - 52.0 (%)   MCV 80.9  78.0 - 100.0 (fL)   MCH 29.1  26.0 - 34.0 (pg)   MCHC 36.0  30.0 - 36.0 (g/dL)   RDW 04.5  40.9 - 81.1 (%)   Platelets 202  150 - 400 (K/uL)  DIFFERENTIAL      Component Value Range   Neutrophils Relative 51  43 - 77 (%)   Neutro Abs 3.4  1.7 - 7.7 (K/uL)   Lymphocytes Relative 44  12 - 46 (%)   Lymphs Abs 3.0  0.7 - 4.0 (K/uL)   Monocytes Relative 5  3 - 12 (%)   Monocytes Absolute 0.4  0.1 - 1.0 (K/uL)   Eosinophils Relative 0  0 - 5 (%)   Eosinophils Absolute 0.0  0.0 - 0.7 (K/uL)   Basophils Relative 0  0 - 1 (%)   Basophils Absolute 0.0  0.0 - 0.1 (K/uL)  COMPREHENSIVE METABOLIC PANEL      Component Value Range   Sodium 134 (*) 135 - 145 (mEq/L)   Potassium 3.6  3.5 - 5.1 (mEq/L)   Chloride 95 (*) 96 - 112 (mEq/L)   CO2 22  19 - 32 (mEq/L)   Glucose, Bld 344 (*) 70 - 99 (mg/dL)   BUN 11  6 - 23 (mg/dL)   Creatinine, Ser 9.14  0.50 - 1.35 (mg/dL)   Calcium 9.3  8.4 - 78.2 (mg/dL)   Total Protein 7.8  6.0 - 8.3 (g/dL)   Albumin 3.6  3.5 - 5.2 (g/dL)   AST  21  0 - 37 (U/L)   ALT 30  0 - 53 (U/L)   Alkaline Phosphatase 90  39 - 117 (U/L)   Total Bilirubin 0.2 (*) 0.3 -  1.2 (mg/dL)   GFR calc non Af Amer >90  >90 (mL/min)   GFR calc Af Amer >90  >90 (mL/min)  LIPASE, BLOOD      Component Value Range   Lipase 16  11 - 59 (U/L)      1. Chronic pain      MDM  4:45 AM patient seen and evaluated. Patient no acute distress.  Labs at baseline without concerning findings.  Pt with good vitals.  Will d/c home.      Angus Seller, [place] 01/16/11 [phone]

## 2011-01-16 NOTE — ED Notes (Signed)
Pt c/o abd pain; states is related to pancreatitis

## 2011-01-30 ENCOUNTER — Emergency Department (HOSPITAL_COMMUNITY)
Admission: EM | Admit: 2011-01-30 | Discharge: 2011-01-30 | Disposition: A | Discharge status: Home / Self Care | Payer: Self-pay | Attending: Emergency Medicine | Admitting: Emergency Medicine

## 2011-01-30 ENCOUNTER — Encounter (HOSPITAL_COMMUNITY): Payer: Self-pay | Admitting: Emergency Medicine

## 2011-01-30 DIAGNOSIS — F1010 Alcohol abuse, uncomplicated: Secondary | ICD-10-CM | POA: Insufficient documentation

## 2011-01-30 DIAGNOSIS — Z794 Long term (current) use of insulin: Secondary | ICD-10-CM | POA: Insufficient documentation

## 2011-01-30 DIAGNOSIS — E1169 Type 2 diabetes mellitus with other specified complication: Secondary | ICD-10-CM | POA: Insufficient documentation

## 2011-01-30 DIAGNOSIS — F10929 Alcohol use, unspecified with intoxication, unspecified: Secondary | ICD-10-CM

## 2011-01-30 DIAGNOSIS — R739 Hyperglycemia, unspecified: Secondary | ICD-10-CM

## 2011-01-30 DIAGNOSIS — R569 Unspecified convulsions: Secondary | ICD-10-CM | POA: Insufficient documentation

## 2011-01-30 DIAGNOSIS — I10 Essential (primary) hypertension: Secondary | ICD-10-CM | POA: Insufficient documentation

## 2011-01-30 LAB — HEPATIC FUNCTION PANEL
ALT: 29 U/L (ref 0–53)
Total Protein: 8.2 g/dL (ref 6.0–8.3)

## 2011-01-30 LAB — CBC
MCHC: 36.2 g/dL — ABNORMAL HIGH (ref 30.0–36.0)
Platelets: 226 10*3/uL (ref 150–400)
RDW: 12.5 % (ref 11.5–15.5)

## 2011-01-30 LAB — BASIC METABOLIC PANEL
Calcium: 9.8 mg/dL (ref 8.4–10.5)
GFR calc Af Amer: >90 mL/min (ref >90)
GFR calc non Af Amer: >90 mL/min (ref >90)
Potassium: 4.3 mEq/L (ref 3.5–5.1)
Sodium: 135 mEq/L (ref 135–145)

## 2011-01-30 LAB — RAPID URINE DRUG SCREEN, HOSP PERFORMED
Cocaine: NOT DETECTED
Opiates: NOT DETECTED

## 2011-01-30 LAB — GLUCOSE, CAPILLARY
Glucose-Capillary: 366 mg/dL — ABNORMAL HIGH (ref 70–99)
Glucose-Capillary: 295 mg/dL — ABNORMAL HIGH (ref 70–99)

## 2011-01-30 LAB — LIPASE, BLOOD: Lipase: 17 U/L (ref 11–59)

## 2011-01-30 MED ORDER — INSULIN ASPART 100 UNIT/ML ~~LOC~~ SOLN
SUBCUTANEOUS | Status: AC
  Filled 2011-01-30: qty 1

## 2011-01-30 MED ORDER — SODIUM CHLORIDE 0.9 % IV BOLUS (SEPSIS)
1000.0000 mL | Freq: Once | INTRAVENOUS | Status: AC
  Administered 2011-01-30: 1000 mL via INTRAVENOUS

## 2011-01-30 MED ORDER — INSULIN ASPART PROT & ASPART (70-30 MIX) 100 UNIT/ML ~~LOC~~ SUSP
10.0000 [IU] | Freq: Once | SUBCUTANEOUS | Status: AC
  Administered 2011-01-30: 10 [IU] via SUBCUTANEOUS

## 2011-01-30 MED ORDER — INSULIN ASPART PROT & ASPART (70-30 MIX) 100 UNIT/ML ~~LOC~~ SUSP
10.0000 [IU] | Freq: Once | SUBCUTANEOUS | Status: AC
  Administered 2011-01-30: 10 [IU] via SUBCUTANEOUS
  Filled 2011-01-30: qty 3

## 2011-01-30 NOTE — ED Notes (Signed)
[id number]<AV> Expected date:<BR> Expected time:<BR> Means of arrival:<BR> Comments:<BR> Abd pain, ETOH, Hx of pancreatitis

## 2011-01-30 NOTE — ED Notes (Signed)
Patient is alert and oriented x3. He has been given DC instructions with MD follow up visits.  Verbal confirmation was given by patient V/S stable.  Patient DC under own ambulatory power. Patient was not showing any signs of distress on DC 

## 2011-01-30 NOTE — ED Provider Notes (Signed)
History     CSN: [id number]  Arrival date & time 01/30/11  0144   First MD Initiated Contact with Patient 01/30/11 [phone]      Chief Complaint  Patient presents with  . Alcohol Intoxication   HPI A LEVEL 5 CAVEAT PERTAINS DUE TO ALTERED MENTAL STATUS/INTOXICATION (Consider location/radiation/quality/duration/timing/severity/associated sxs/prior Treatment) Patient presenting with alcohol intoxication. He states he was drinking alcohol this evening and was unable to walk home tonight. He was brought in by EMS after been found him mild on the ground in his neighbor's yard. He denies any abdominal pain or vomiting. He does have a history of diabetes and takes and:. He's not able to contribute much further to the history but does endorse alcohol use tonight HPI  Past Medical History  Diagnosis Date  . Diabetes mellitus   . Hypertension   . Seizures   . Pancreatitis   . Alcohol abuse     Past Surgical History  Procedure Date  . Appendectomy     History reviewed. No pertinent family history.  History  Substance Use Topics  . Smoking status: Former Smoker    Types: Cigarettes    Quit date: 12/21/1984  . Smokeless tobacco: Not on file  . Alcohol Use: Yes     heavy drinker: binge drinking per pt      Review of Systems UNABLE TO OBTAIN ROS DUE TO LEVEL 5 CAVEAT, ALTERED MENTAL STATUS, INTOXICATION  Allergies  Morphine and related  Home Medications   Current Outpatient Rx  Name Route Sig Dispense Refill  . ACETAMINOPHEN 500 MG PO TABS Oral Take 1,000 mg by mouth every 6 (six) hours as needed. For pain    . AMYLASE-LIPASE-PROTEASE 66.05-27-73 MU PO CPEP Oral Take 1 capsule by mouth 3 (three) times daily with meals. Usually eats 2 large meals per day    . CEPHALEXIN 500 MG PO CAPS Oral Take 500 mg by mouth 4 (four) times daily.      Marland Kitchen GABAPENTIN 300 MG PO CAPS Oral Take 300 mg by mouth 3 (three) times daily.      . IBUPROFEN 200 MG PO TABS Oral Take 200-400 mg by mouth  every 6 (six) hours as needed. pain     . INSULIN ASPART 100 UNIT/ML [place] SOLN Subcutaneous Inject 14-18 Units into the skin 3 (three) times daily before meals. Based on sliding scale    . LANTUS [place] Subcutaneous Inject 40 Units into the skin at bedtime.     Marland Kitchen LISINOPRIL 10 MG PO TABS Oral Take 10 mg by mouth daily.      Marland Kitchen OMEPRAZOLE 20 MG PO CPDR Oral Take 1 capsule (20 mg total) by mouth daily. 20 capsule 0  . OXYCODONE-ACETAMINOPHEN 5-325 MG PO TABS Oral Take 1-2 tablets by mouth every 4 (four) hours as needed. pain     . PANTOPRAZOLE SODIUM 40 MG PO TBEC Oral Take 40 mg by mouth daily.      Marland Kitchen PHENYTOIN SODIUM EXTENDED 100 MG PO CAPS Oral Take 200 mg by mouth at bedtime.      Marland Kitchen PREDNISONE 10 MG PO TABS Oral Take 10 mg by mouth daily. Tapered dosing starting with 6 tabs for three days then decreasing by one tablet every three days until at zero tabs     . TRIAMCINOLONE ACETONIDE 0.1 % EX CREA Topical Apply topically 2 (two) times daily. 30 g 0  . HYDROCHLOROTHIAZIDE PO Oral Take 25 mg by mouth daily.  BP 146/88  Pulse 122  Temp(Src) 98.7 F (37.1 C) (Oral)  Resp 20  SpO2 96% Vitals reviewed Physical Exam Physical Examination: General appearance - alert, responsive, slurred speech, and in no acute distress Mental status - intoxicated/somnolent but arousable, oriented to person, place, but not to time Eyes - pupils equal and reactive, no scleral icterus Mouth - mucous membranes moist, pharynx normal without lesions Chest - clear to auscultation, no wheezes, rales or rhonchi, symmetric air entry Heart - normal rate, regular rhythm, normal S1, S2, no murmurs, rubs, clicks or gallops Abdomen - soft, nontender, nondistended, no masses or organomegaly Musculoskeletal - no joint tenderness, deformity or swelling Extremities - peripheral pulses normal, no pedal edema, no clubbing or cyanosis Skin - normal coloration and turgor, no rashes  ED Course  Procedures (including critical care  time)  Labs Reviewed  CBC - Abnormal; Notable for the following:    MCHC 36.2 (*)    All other components within normal limits  BASIC METABOLIC PANEL - Abnormal; Notable for the following:    Chloride 93 (*)    Glucose, Bld 401 (*)    All other components within normal limits  ETHANOL - Abnormal; Notable for the following:    Alcohol, Ethyl (B) 260 (*)    All other components within normal limits  URINE RAPID DRUG SCREEN (HOSP PERFORMED) - Abnormal; Notable for the following:    Tetrahydrocannabinol POSITIVE (*)    All other components within normal limits  HEPATIC FUNCTION PANEL - Abnormal; Notable for the following:    Total Bilirubin 0.2 (*)    All other components within normal limits  GLUCOSE, CAPILLARY - Abnormal; Notable for the following:    Glucose-Capillary 366 (*)    All other components within normal limits  GLUCOSE, CAPILLARY - Abnormal; Notable for the following:    Glucose-Capillary 295 (*)    All other components within normal limits  GLUCOSE, CAPILLARY - Abnormal; Notable for the following:    Glucose-Capillary 382 (*)    All other components within normal limits  LIPASE, BLOOD  LAB REPORT - SCANNED   No results found.  1. Alcohol intoxication   2. Hyperglycemia       MDM  Patient with history of alcohol abuse, pancreatitis, diabetes presenting with alcohol intoxication. Patient was found to have hyperglycemia as well. He was treated with normal saline IV bolus, insulin. You also be observed until he sobers up.  CBG decreased after fluids and insulin to 295- will continue to treat.         Ethelda Chick, MD 01/31/11 [phone]

## 2011-01-30 NOTE — ED Provider Notes (Addendum)
Pt care was taken over from Dr. Karma Ganja.  PT brought in last night intoxicated.  Now pt is alert, oriented x3.  Ambulates without problem.  Denies abd pain.  Ready to go home.  Has had some elevated blood sugars.  Advised him to keep an eye on this at home.  Got recent insulin injection here.  Advised pt he may need to give himself extra insulin shots at home if it is not coming down.  Follow up with his PMD  Rolan Bucco, MD 01/30/11 1008  Rolan Bucco, MD 01/30/11 1008  Rolan Bucco, MD 01/30/11 [phone]

## 2011-01-30 NOTE — ED Notes (Signed)
PER EMS: Pt with hx of ETOH abuse and pancreatitis was unable to walk home tonight after spending the evening drinking, reclined on ground in front yard of neighbor's house and called 911.

## 2011-02-03 ENCOUNTER — Emergency Department (HOSPITAL_COMMUNITY)
Admission: EM | Admit: 2011-02-03 | Discharge: 2011-02-03 | Discharge status: Against Medical Advice | Payer: Self-pay | Attending: Emergency Medicine | Admitting: Emergency Medicine

## 2011-02-03 DIAGNOSIS — Z79899 Other long term (current) drug therapy: Secondary | ICD-10-CM | POA: Insufficient documentation

## 2011-02-03 DIAGNOSIS — G40909 Epilepsy, unspecified, not intractable, without status epilepticus: Secondary | ICD-10-CM | POA: Insufficient documentation

## 2011-02-03 DIAGNOSIS — R6883 Chills (without fever): Secondary | ICD-10-CM | POA: Insufficient documentation

## 2011-02-03 DIAGNOSIS — F1010 Alcohol abuse, uncomplicated: Secondary | ICD-10-CM | POA: Insufficient documentation

## 2011-02-03 DIAGNOSIS — R197 Diarrhea, unspecified: Secondary | ICD-10-CM | POA: Insufficient documentation

## 2011-02-03 DIAGNOSIS — Z794 Long term (current) use of insulin: Secondary | ICD-10-CM | POA: Insufficient documentation

## 2011-02-03 DIAGNOSIS — I10 Essential (primary) hypertension: Secondary | ICD-10-CM | POA: Insufficient documentation

## 2011-02-03 DIAGNOSIS — E119 Type 2 diabetes mellitus without complications: Secondary | ICD-10-CM | POA: Insufficient documentation

## 2011-02-03 DIAGNOSIS — R000 Tachycardia, unspecified: Secondary | ICD-10-CM | POA: Insufficient documentation

## 2011-02-03 DIAGNOSIS — R109 Unspecified abdominal pain: Secondary | ICD-10-CM | POA: Insufficient documentation

## 2011-02-03 DIAGNOSIS — R739 Hyperglycemia, unspecified: Secondary | ICD-10-CM

## 2011-02-03 DIAGNOSIS — F10929 Alcohol use, unspecified with intoxication, unspecified: Secondary | ICD-10-CM

## 2011-02-03 DIAGNOSIS — R112 Nausea with vomiting, unspecified: Secondary | ICD-10-CM | POA: Insufficient documentation

## 2011-02-03 LAB — COMPREHENSIVE METABOLIC PANEL
Albumin: 4 g/dL (ref 3.5–5.2)
Alkaline Phosphatase: 87 U/L (ref 39–117)
BUN: 11 mg/dL (ref 6–23)
Chloride: 98 mEq/L (ref 96–112)
Creatinine, Ser: 0.77 mg/dL (ref 0.50–1.35)
GFR calc Af Amer: >90 mL/min (ref >90)
Glucose, Bld: 293 mg/dL — ABNORMAL HIGH (ref 70–99)
Total Bilirubin: 0.3 mg/dL (ref 0.3–1.2)
Total Protein: 8.3 g/dL (ref 6.0–8.3)

## 2011-02-03 LAB — CBC
HCT: 41.7 % (ref 39.0–52.0)
Hemoglobin: 15.2 g/dL (ref 13.0–17.0)
MCH: 30.2 pg (ref 26.0–34.0)
MCHC: 36.5 g/dL — ABNORMAL HIGH (ref 30.0–36.0)
RBC: 5.04 MIL/uL (ref 4.22–5.81)

## 2011-02-03 LAB — DIFFERENTIAL
Basophils Relative: 0 % (ref 0–1)
Eosinophils Absolute: 0 10*3/uL (ref 0.0–0.7)
Lymphs Abs: 0.7 10*3/uL (ref 0.7–4.0)
Monocytes Absolute: 0 10*3/uL — ABNORMAL LOW (ref 0.1–1.0)
Monocytes Relative: 0 % — ABNORMAL LOW (ref 3–12)

## 2011-02-03 LAB — LIPASE, BLOOD: Lipase: 9 U/L — ABNORMAL LOW (ref 11–59)

## 2011-02-03 LAB — ETHANOL: Alcohol, Ethyl (B): 291 mg/dL — ABNORMAL HIGH (ref 0–11)

## 2011-02-03 MED ORDER — ACETAMINOPHEN 325 MG PO TABS: 650.0000 mg | ORAL_TABLET | Freq: Once | ORAL | Status: DC

## 2011-02-03 MED ORDER — SODIUM CHLORIDE 0.9 % IV BOLUS (SEPSIS): 1000.0000 mL | Freq: Once | INTRAVENOUS | Status: DC

## 2011-02-03 NOTE — ED Notes (Addendum)
Pt no longer at bedside. Pt not in restroom. MD made aware.

## 2011-02-03 NOTE — ED Notes (Signed)
Pt reports abdominal pain. Diabetic. ETOH. Hx of pancreatitis.

## 2011-02-03 NOTE — ED Notes (Signed)
Pt standing up and getting dressed. Pt reporting he needs to get a ride to go pick up his daughter. Pt reports his cousin is giving him a ride. MD and PA at bedside. Ride to come to bedside.

## 2011-02-03 NOTE — ED Provider Notes (Signed)
History     CSN: [id number]  Arrival date & time 02/03/11  2124   First MD Initiated Contact with Patient 02/03/11 2140      Chief Complaint  Patient presents with  . Abdominal Pain    (Consider location/radiation/quality/duration/timing/severity/associated sxs/prior treatment) Patient is a 50 y.o. male presenting with abdominal pain. The history is provided by the patient.  Abdominal Pain The primary symptoms of the illness include abdominal pain, nausea, vomiting and diarrhea. The primary symptoms of the illness do not include fever, fatigue, hematemesis or dysuria. The current episode started 3 to 5 hours ago. The onset of the illness was sudden.  Additional symptoms associated with the illness include chills.  Pt state he has been drinking all day. States now having abdominal pain, similar to that of previous pancreatitis. Admits to nausea, vomiting, denies fever, chills, diarrhea.   Past Medical History  Diagnosis Date  . Diabetes mellitus   . Hypertension   . Seizures   . Pancreatitis   . Alcohol abuse     Past Surgical History  Procedure Date  . Appendectomy     No family history on file.  History  Substance Use Topics  . Smoking status: Former Smoker    Types: Cigarettes    Quit date: 12/21/1984  . Smokeless tobacco: Not on file  . Alcohol Use: Yes     heavy drinker: binge drinking per pt      Review of Systems  Constitutional: Positive for chills. Negative for fever and fatigue.  HENT: Negative.   Eyes: Negative.   Respiratory: Negative.   Cardiovascular: Negative.   Gastrointestinal: Positive for nausea, vomiting, abdominal pain and diarrhea. Negative for hematemesis.  Genitourinary: Negative for dysuria.  Musculoskeletal: Negative.   Skin: Negative.   Neurological: Negative.   Psychiatric/Behavioral: Negative.     Allergies  Morphine and related  Home Medications   Current Outpatient Rx  Name Route Sig Dispense Refill  . ACETAMINOPHEN  500 MG PO TABS Oral Take 1,000 mg by mouth every 6 (six) hours as needed. For pain    . AMYLASE-LIPASE-PROTEASE 66.05-27-73 MU PO CPEP Oral Take 1 capsule by mouth 3 (three) times daily with meals. Usually eats 2 large meals per day    . CEPHALEXIN 500 MG PO CAPS Oral Take 500 mg by mouth 4 (four) times daily.      Marland Kitchen GABAPENTIN 300 MG PO CAPS Oral Take 300 mg by mouth 3 (three) times daily.      Marland Kitchen HYDROCHLOROTHIAZIDE PO Oral Take 25 mg by mouth daily.     . IBUPROFEN 200 MG PO TABS Oral Take 200-400 mg by mouth every 6 (six) hours as needed. pain     . INSULIN ASPART 100 UNIT/ML [place] SOLN Subcutaneous Inject 14-18 Units into the skin 3 (three) times daily before meals. Based on sliding scale    . LANTUS [place] Subcutaneous Inject 40 Units into the skin at bedtime.     . OMEPRAZOLE 20 MG PO CPDR Oral Take 1 capsule (20 mg total) by mouth daily. 20 capsule 0  . OXYCODONE-ACETAMINOPHEN 5-325 MG PO TABS Oral Take 1-2 tablets by mouth every 4 (four) hours as needed. pain     . PANTOPRAZOLE SODIUM 40 MG PO TBEC Oral Take 40 mg by mouth daily.      Marland Kitchen PHENYTOIN SODIUM EXTENDED 100 MG PO CAPS Oral Take 200 mg by mouth at bedtime.      Marland Kitchen LISINOPRIL 10 MG PO TABS Oral Take 10  mg by mouth daily.        There were no vitals taken for this visit.  Physical Exam  Nursing note and vitals reviewed. Constitutional: He is oriented to person, place, and time. He appears well-developed and well-nourished. No distress.       Appears intoxicated  HENT:  Head: Normocephalic and atraumatic.  Cardiovascular: Regular rhythm and normal heart sounds.        tachycardic  Abdominal: Soft. Bowel sounds are normal. He exhibits no distension. There is no rebound.       Diffuse tenderness, no guarding  Musculoskeletal: Normal range of motion. He exhibits no edema.  Neurological: He is alert and oriented to person, place, and time.       Poor coordination, intoxicated  Skin: Skin is dry. No rash noted.  Psychiatric: He has a  normal mood and affect.    ED Course  Procedures (including critical care time)  Pt appears intoxicated, hx of pancreatitis. Asking for pain medications. Tylenol ordered. Labs pending, fluids running, pt tachycardic  Pt wanting to leave, however intoxicated. Will sign out ama, Explained dangers of leaving against medical advise.  pt's cousin will take him home.    MDM          Lottie Mussel, PA 02/04/11 0002

## 2011-02-04 NOTE — ED Provider Notes (Signed)
Medical screening examination/treatment/procedure(s) were conducted as a shared visit with non-physician practitioner(s) and myself.  I personally evaluated the patient during the encounter.  Pt is lucid, despite being drunk.  Flint Melter, MD 02/04/11 [phone]

## 2011-02-08 ENCOUNTER — Encounter (HOSPITAL_COMMUNITY): Payer: Self-pay | Admitting: Emergency Medicine

## 2011-02-08 ENCOUNTER — Emergency Department (HOSPITAL_COMMUNITY)
Admission: EM | Admit: 2011-02-08 | Discharge: 2011-02-08 | Disposition: A | Discharge status: Home / Self Care | Payer: Self-pay | Attending: Emergency Medicine | Admitting: Emergency Medicine

## 2011-02-08 DIAGNOSIS — Z79899 Other long term (current) drug therapy: Secondary | ICD-10-CM | POA: Insufficient documentation

## 2011-02-08 DIAGNOSIS — R1084 Generalized abdominal pain: Secondary | ICD-10-CM | POA: Insufficient documentation

## 2011-02-08 DIAGNOSIS — F1010 Alcohol abuse, uncomplicated: Secondary | ICD-10-CM

## 2011-02-08 DIAGNOSIS — Z794 Long term (current) use of insulin: Secondary | ICD-10-CM | POA: Insufficient documentation

## 2011-02-08 DIAGNOSIS — I10 Essential (primary) hypertension: Secondary | ICD-10-CM | POA: Insufficient documentation

## 2011-02-08 DIAGNOSIS — R739 Hyperglycemia, unspecified: Secondary | ICD-10-CM

## 2011-02-08 DIAGNOSIS — E119 Type 2 diabetes mellitus without complications: Secondary | ICD-10-CM | POA: Insufficient documentation

## 2011-02-08 DIAGNOSIS — R10817 Generalized abdominal tenderness: Secondary | ICD-10-CM | POA: Insufficient documentation

## 2011-02-08 LAB — DIFFERENTIAL
Eosinophils Absolute: 0.1 10*3/uL (ref 0.0–0.7)
Eosinophils Relative: 2 % (ref 0–5)
Lymphocytes Relative: 53 % — ABNORMAL HIGH (ref 12–46)
Lymphs Abs: 3 10*3/uL (ref 0.7–4.0)
Monocytes Absolute: 0.2 10*3/uL (ref 0.1–1.0)
Monocytes Relative: 4 % (ref 3–12)

## 2011-02-08 LAB — CBC
HCT: 40.7 % (ref 39.0–52.0)
Hemoglobin: 14.8 g/dL (ref 13.0–17.0)
MCH: 29.9 pg (ref 26.0–34.0)
MCV: 82.2 fL (ref 78.0–100.0)
Platelets: 190 10*3/uL (ref 150–400)
RBC: 4.95 MIL/uL (ref 4.22–5.81)
WBC: 5.7 10*3/uL (ref 4.0–10.5)

## 2011-02-08 LAB — COMPREHENSIVE METABOLIC PANEL
ALT: 38 U/L (ref 0–53)
BUN: 9 mg/dL (ref 6–23)
CO2: 24 mEq/L (ref 19–32)
Calcium: 9.3 mg/dL (ref 8.4–10.5)
Creatinine, Ser: 0.82 mg/dL (ref 0.50–1.35)
GFR calc Af Amer: >90 mL/min (ref >90)
GFR calc non Af Amer: >90 mL/min (ref >90)
Glucose, Bld: 295 mg/dL — ABNORMAL HIGH (ref 70–99)
Sodium: 141 mEq/L (ref 135–145)
Total Protein: 7.5 g/dL (ref 6.0–8.3)

## 2011-02-08 LAB — URINALYSIS, ROUTINE W REFLEX MICROSCOPIC
Bilirubin Urine: NEGATIVE
Hgb urine dipstick: NEGATIVE
Nitrite: NEGATIVE
Protein, ur: NEGATIVE mg/dL
Specific Gravity, Urine: 1.026 (ref 1.005–1.030)
Urobilinogen, UA: 0.2 mg/dL (ref 0.0–1.0)

## 2011-02-08 LAB — LIPASE, BLOOD: Lipase: 13 U/L (ref 11–59)

## 2011-02-08 LAB — URINE MICROSCOPIC-ADD ON

## 2011-02-08 MED ORDER — SODIUM CHLORIDE 0.9 % IV BOLUS (SEPSIS)
1000.0000 mL | Freq: Once | INTRAVENOUS | Status: AC
  Administered 2011-02-08: 1000 mL via INTRAVENOUS

## 2011-02-08 NOTE — ED Provider Notes (Signed)
History    50 year old male with history of chronic pancreatitis. Patient states he is having upper abdominal pain after drinking moderate amount of alcohol tonight. Described pain as sharp, throbbing, with associated nausea but without vomiting or diarrhea. Similar to prior pancreatitis. He denies fever, chills, chest pain, shortness of breath. He denies rash or recent trauma.  CSN: [id number]  Arrival date & time 02/08/11  4098   First MD Initiated Contact with Patient 02/08/11 [phone]      Chief Complaint  Patient presents with  . Abdominal Pain    (Consider location/radiation/quality/duration/timing/severity/associated sxs/prior treatment) HPI  Past Medical History  Diagnosis Date  . Diabetes mellitus   . Hypertension   . Seizures   . Pancreatitis   . Alcohol abuse     Past Surgical History  Procedure Date  . Appendectomy     No family history on file.  History  Substance Use Topics  . Smoking status: Former Smoker    Types: Cigarettes    Quit date: 12/21/1984  . Smokeless tobacco: Not on file  . Alcohol Use: Yes     heavy drinker: binge drinking per pt      Review of Systems  All other systems reviewed and are negative.    Allergies  Morphine and related  Home Medications   Current Outpatient Rx  Name Route Sig Dispense Refill  . ACETAMINOPHEN 500 MG PO TABS Oral Take 1,000 mg by mouth every 6 (six) hours as needed. For pain    . AMYLASE-LIPASE-PROTEASE 66.05-27-73 MU PO CPEP Oral Take 1 capsule by mouth 3 (three) times daily with meals. Usually eats 2 large meals per day    . CEPHALEXIN 500 MG PO CAPS Oral Take 500 mg by mouth 4 (four) times daily.      Marland Kitchen GABAPENTIN 300 MG PO CAPS Oral Take 300 mg by mouth 3 (three) times daily.      Marland Kitchen HYDROCHLOROTHIAZIDE PO Oral Take 25 mg by mouth daily.     . IBUPROFEN 200 MG PO TABS Oral Take 200-400 mg by mouth every 6 (six) hours as needed. pain     . INSULIN ASPART 100 UNIT/ML [place] SOLN Subcutaneous Inject 14-18  Units into the skin 3 (three) times daily before meals. Based on sliding scale    . LANTUS [place] Subcutaneous Inject 40 Units into the skin at bedtime.     Marland Kitchen LISINOPRIL 10 MG PO TABS Oral Take 10 mg by mouth daily.      Marland Kitchen OMEPRAZOLE 20 MG PO CPDR Oral Take 1 capsule (20 mg total) by mouth daily. 20 capsule 0  . OXYCODONE-ACETAMINOPHEN 5-325 MG PO TABS Oral Take 1-2 tablets by mouth every 4 (four) hours as needed. pain     . PANTOPRAZOLE SODIUM 40 MG PO TBEC Oral Take 40 mg by mouth daily.      Marland Kitchen PHENYTOIN SODIUM EXTENDED 100 MG PO CAPS Oral Take 200 mg by mouth at bedtime.        BP 135/99  Pulse 125  Temp(Src) 98 F (36.7 C) (Oral)  Resp 16  SpO2 96%  Physical Exam  Nursing note and vitals reviewed. Constitutional: He appears well-developed and well-nourished. No distress.       Awake, alert, nontoxic appearance  HENT:  Head: Atraumatic.  Eyes: Right eye exhibits no discharge. Left eye exhibits no discharge.  Neck: Neck supple.  Pulmonary/Chest: Effort normal. He exhibits no tenderness.  Abdominal: There is generalized tenderness. There is no rebound, no tenderness at  McBurney's point and negative Murphy's sign.  Musculoskeletal: He exhibits no tenderness.       Baseline ROM, no obvious new focal weakness  Neurological:       Mental status and motor strength appears baseline for patient and situation  Skin: No rash noted.  Psychiatric: He has a normal mood and affect.    ED Course  Procedures (including critical care time)  Labs Reviewed  URINALYSIS, ROUTINE W REFLEX MICROSCOPIC - Abnormal; Notable for the following:    Glucose, UA >1000 (*)    All other components within normal limits  URINE MICROSCOPIC-ADD ON   No results found.   No diagnosis found.  Results for orders placed during the hospital encounter of 02/08/11  URINALYSIS, ROUTINE W REFLEX MICROSCOPIC      Component Value Range   Color, Urine YELLOW  YELLOW    APPearance CLEAR  CLEAR    Specific Gravity,  Urine 1.026  1.005 - 1.030    pH 5.5  5.0 - 8.0    Glucose, UA >1000 (*) NEGATIVE (mg/dL)   Hgb urine dipstick NEGATIVE  NEGATIVE    Bilirubin Urine NEGATIVE  NEGATIVE    Ketones, ur NEGATIVE  NEGATIVE (mg/dL)   Protein, ur NEGATIVE  NEGATIVE (mg/dL)   Urobilinogen, UA 0.2  0.0 - 1.0 (mg/dL)   Nitrite NEGATIVE  NEGATIVE    Leukocytes, UA NEGATIVE  NEGATIVE   URINE MICROSCOPIC-ADD ON      Component Value Range   Squamous Epithelial / LPF RARE  RARE    WBC, UA 0-2  <3 (WBC/hpf)   RBC / HPF 0-2  <3 (RBC/hpf)   Bacteria, UA RARE  RARE   CBC      Component Value Range   WBC 5.7  4.0 - 10.5 (K/uL)   RBC 4.95  4.22 - 5.81 (MIL/uL)   Hemoglobin 14.8  13.0 - 17.0 (g/dL)   HCT 47.8  29.5 - 62.1 (%)   MCV 82.2  78.0 - 100.0 (fL)   MCH 29.9  26.0 - 34.0 (pg)   MCHC 36.4 (*) 30.0 - 36.0 (g/dL)   RDW 30.8  65.7 - 84.6 (%)   Platelets 190  150 - 400 (K/uL)  DIFFERENTIAL      Component Value Range   Neutrophils Relative 41 (*) 43 - 77 (%)   Neutro Abs 2.3  1.7 - 7.7 (K/uL)   Lymphocytes Relative 53 (*) 12 - 46 (%)   Lymphs Abs 3.0  0.7 - 4.0 (K/uL)   Monocytes Relative 4  3 - 12 (%)   Monocytes Absolute 0.2  0.1 - 1.0 (K/uL)   Eosinophils Relative 2  0 - 5 (%)   Eosinophils Absolute 0.1  0.0 - 0.7 (K/uL)   Basophils Relative 0  0 - 1 (%)   Basophils Absolute 0.0  0.0 - 0.1 (K/uL)  COMPREHENSIVE METABOLIC PANEL      Component Value Range   Sodium 141  135 - 145 (mEq/L)   Potassium 3.8  3.5 - 5.1 (mEq/L)   Chloride 103  96 - 112 (mEq/L)   CO2 24  19 - 32 (mEq/L)   Glucose, Bld 295 (*) 70 - 99 (mg/dL)   BUN 9  6 - 23 (mg/dL)   Creatinine, Ser 9.62  0.50 - 1.35 (mg/dL)   Calcium 9.3  8.4 - 95.2 (mg/dL)   Total Protein 7.5  6.0 - 8.3 (g/dL)   Albumin 3.7  3.5 - 5.2 (g/dL)   AST 32  0 -  37 (U/L)   ALT 38  0 - 53 (U/L)   Alkaline Phosphatase 78  39 - 117 (U/L)   Total Bilirubin 0.3  0.3 - 1.2 (mg/dL)   GFR calc non Af Amer >90  >90 (mL/min)   GFR calc Af Amer >90  >90 (mL/min)    LIPASE, BLOOD      Component Value Range   Lipase 13  11 - 59 (U/L)   No results found.    MDM  Pt well known to the ED.  Pt's pain has improved. Pt request to be discharged.  He is aware that his sugar is high.  He will resume his usual medication.          Fayrene Helper, [place] 02/08/11 [phone]

## 2011-02-08 NOTE — ED Notes (Signed)
I gave the patient a warm blanket. 

## 2011-02-08 NOTE — ED Notes (Signed)
C/o abd pain that started after drinking vodka tonight.  History of pancreatitis.

## 2011-02-08 NOTE — ED Notes (Signed)
Assumed care of pt.  No distress noted.  Pt resting, noted to be walking around the unit without difficulty to smoke a cigarette.

## 2011-02-09 ENCOUNTER — Encounter (HOSPITAL_COMMUNITY): Payer: Self-pay | Admitting: Emergency Medicine

## 2011-02-09 ENCOUNTER — Emergency Department (HOSPITAL_COMMUNITY)
Admission: EM | Admit: 2011-02-09 | Discharge: 2011-02-09 | Disposition: A | Discharge status: Home / Self Care | Payer: Self-pay | Attending: Emergency Medicine | Admitting: Emergency Medicine

## 2011-02-09 DIAGNOSIS — I10 Essential (primary) hypertension: Secondary | ICD-10-CM | POA: Insufficient documentation

## 2011-02-09 DIAGNOSIS — K2970 Gastritis, unspecified, without bleeding: Secondary | ICD-10-CM | POA: Insufficient documentation

## 2011-02-09 DIAGNOSIS — Z794 Long term (current) use of insulin: Secondary | ICD-10-CM | POA: Insufficient documentation

## 2011-02-09 DIAGNOSIS — F1010 Alcohol abuse, uncomplicated: Secondary | ICD-10-CM | POA: Insufficient documentation

## 2011-02-09 DIAGNOSIS — E119 Type 2 diabetes mellitus without complications: Secondary | ICD-10-CM | POA: Insufficient documentation

## 2011-02-09 DIAGNOSIS — R109 Unspecified abdominal pain: Secondary | ICD-10-CM | POA: Insufficient documentation

## 2011-02-09 DIAGNOSIS — F10929 Alcohol use, unspecified with intoxication, unspecified: Secondary | ICD-10-CM

## 2011-02-09 LAB — COMPREHENSIVE METABOLIC PANEL
ALT: 37 U/L (ref 0–53)
Alkaline Phosphatase: 82 U/L (ref 39–117)
Chloride: 96 mEq/L (ref 96–112)
GFR calc Af Amer: >90 mL/min (ref >90)
Glucose, Bld: 292 mg/dL — ABNORMAL HIGH (ref 70–99)
Potassium: 3.4 mEq/L — ABNORMAL LOW (ref 3.5–5.1)
Sodium: 134 mEq/L — ABNORMAL LOW (ref 135–145)
Total Protein: 7.4 g/dL (ref 6.0–8.3)

## 2011-02-09 LAB — CBC
HCT: 38.5 % — ABNORMAL LOW (ref 39.0–52.0)
MCHC: 35.3 g/dL (ref 30.0–36.0)
MCV: 82.6 fL (ref 78.0–100.0)
RDW: 13 % (ref 11.5–15.5)

## 2011-02-09 MED ORDER — SODIUM CHLORIDE 0.9 % IV BOLUS (SEPSIS)
500.0000 mL | Freq: Once | INTRAVENOUS | Status: AC
  Administered 2011-02-09: 500 mL via INTRAVENOUS

## 2011-02-09 MED ORDER — POTASSIUM CHLORIDE CRYS ER 20 MEQ PO TBCR
40.0000 meq | EXTENDED_RELEASE_TABLET | Freq: Once | ORAL | Status: AC
  Administered 2011-02-09: 40 meq via ORAL
  Filled 2011-02-09: qty 2

## 2011-02-09 MED ORDER — HYDROMORPHONE HCL PF 1 MG/ML IJ SOLN
0.5000 mg | Freq: Once | INTRAMUSCULAR | Status: AC
  Administered 2011-02-09: 0.5 mg via INTRAVENOUS
  Filled 2011-02-09: qty 1

## 2011-02-09 MED ORDER — PANTOPRAZOLE SODIUM 40 MG PO TBEC: 40.0000 mg | DELAYED_RELEASE_TABLET | Freq: Every day | ORAL | Status: DC

## 2011-02-09 MED ORDER — ONDANSETRON HCL 4 MG/2ML IJ SOLN
4.0000 mg | Freq: Once | INTRAMUSCULAR | Status: AC
  Administered 2011-02-09: 4 mg via INTRAVENOUS
  Filled 2011-02-09: qty 2

## 2011-02-09 MED ORDER — PANTOPRAZOLE SODIUM 40 MG IV SOLR
40.0000 mg | Freq: Once | INTRAVENOUS | Status: AC
  Administered 2011-02-09: 40 mg via INTRAVENOUS
  Filled 2011-02-09: qty 40

## 2011-02-09 NOTE — ED Notes (Signed)
WUJ:[id number]<JY> Expected date:<BR> Expected time:<BR> Means of arrival:<BR> Comments:<BR> EMS/abd pain/ETOH/hx pancreatitis

## 2011-02-09 NOTE — ED Provider Notes (Signed)
Medical screening examination/treatment/procedure(s) were performed by non-physician practitioner and as supervising physician I was immediately available for consultation/collaboration.   Ramata Strothman L Tameca Jerez, MD 02/09/11 1212 

## 2011-02-09 NOTE — ED Notes (Signed)
Abdominal pain and vomiting started 2 hrs pta, medicated with zofran 4 mg iv by ems pta, no active vomiting, denies nausea, abdominal pain scale 7/10 with movements

## 2011-02-09 NOTE — ED Notes (Signed)
Abdominal pain and vomiting started 2 hours pta, medicated with zofran 4 mg iv by ems pta, no active vomiting, denies nausea, abdominal pain scale 7/10 with movements

## 2011-02-09 NOTE — ED Provider Notes (Addendum)
History     CSN: [id number]  Arrival date & time 02/09/11  0028   First MD Initiated Contact with Patient 02/09/11 [phone]      Chief Complaint  Patient presents with  . Abdominal Pain  . Nausea  . Emesis    (Consider location/radiation/quality/duration/timing/severity/associated sxs/prior treatment) Patient is a 51 y.o. male presenting with abdominal pain and vomiting. The history is provided by the patient.  Abdominal Pain The primary symptoms of the illness include abdominal pain and vomiting. The primary symptoms of the illness do not include fever or shortness of breath.  Symptoms associated with the illness do not include back pain.  Emesis  Associated symptoms include abdominal pain. Pertinent negatives include no cough, no fever and no headaches.  pt c/o epigastric pain, nv in past day. Hx pancreatitis and etoh abuse. Was drinking earlier. Denies hx pud. Prior abd surgery is appendx. Pain constant, dull, non radiating. No back or flank pain. No fever or chills. Having normal bms. No cp or sob.   Past Medical History  Diagnosis Date  . Diabetes mellitus   . Hypertension   . Seizures   . Pancreatitis   . Alcohol abuse     Past Surgical History  Procedure Date  . Appendectomy     History reviewed. No pertinent family history.  History  Substance Use Topics  . Smoking status: Former Smoker    Types: Cigarettes    Quit date: 12/21/1984  . Smokeless tobacco: Not on file  . Alcohol Use: Yes     heavy drinker: binge drinking per pt      Review of Systems  Constitutional: Negative for fever.  HENT: Negative for neck pain.   Eyes: Negative for redness.  Respiratory: Negative for cough and shortness of breath.   Cardiovascular: Negative for chest pain and leg swelling.  Gastrointestinal: Positive for vomiting and abdominal pain.  Genitourinary: Negative for flank pain.  Musculoskeletal: Negative for back pain.  Skin: Negative for rash.  Neurological: Negative  for headaches.  Hematological: Does not bruise/bleed easily.  Psychiatric/Behavioral: Negative for confusion.    Allergies  Morphine and related  Home Medications   Current Outpatient Rx  Name Route Sig Dispense Refill  . ACETAMINOPHEN 500 MG PO TABS Oral Take 1,000 mg by mouth every 6 (six) hours as needed. For pain    . AMYLASE-LIPASE-PROTEASE 66.05-27-73 MU PO CPEP Oral Take 1 capsule by mouth 3 (three) times daily with meals. Usually eats 2 large meals per day    . CEPHALEXIN 500 MG PO CAPS Oral Take 500 mg by mouth 4 (four) times daily.      Marland Kitchen GABAPENTIN 300 MG PO CAPS Oral Take 300 mg by mouth 3 (three) times daily.      Marland Kitchen HYDROCHLOROTHIAZIDE PO Oral Take 25 mg by mouth daily.     . IBUPROFEN 200 MG PO TABS Oral Take 200-400 mg by mouth every 6 (six) hours as needed. pain     . INSULIN ASPART 100 UNIT/ML [place] SOLN Subcutaneous Inject 14-18 Units into the skin 3 (three) times daily before meals. Based on sliding scale    . LANTUS [place] Subcutaneous Inject 40 Units into the skin at bedtime.     Marland Kitchen LISINOPRIL 10 MG PO TABS Oral Take 10 mg by mouth daily.      Marland Kitchen OMEPRAZOLE 20 MG PO CPDR Oral Take 1 capsule (20 mg total) by mouth daily. 20 capsule 0  . OXYCODONE-ACETAMINOPHEN 5-325 MG PO TABS Oral Take  1-2 tablets by mouth every 4 (four) hours as needed. pain     . PANTOPRAZOLE SODIUM 40 MG PO TBEC Oral Take 40 mg by mouth daily.      Marland Kitchen PHENYTOIN SODIUM EXTENDED 100 MG PO CAPS Oral Take 200 mg by mouth at bedtime.        BP 156/97  Pulse 112  Temp(Src) 99.2 F (37.3 C) (Oral)  Resp 17  Ht 5\' 11"  (1.803 m)  Wt 210 lb (95.255 kg)  BMI 29.29 kg/m2  SpO2 100%  Physical Exam  Nursing note and vitals reviewed. Constitutional: He is oriented to person, place, and time. He appears well-developed and well-nourished. No distress.  HENT:  Head: Atraumatic.  Eyes: Pupils are equal, round, and reactive to light.  Neck: Neck supple. No tracheal deviation present.  Cardiovascular: Normal rate,  normal heart sounds and intact distal pulses.  Exam reveals no gallop and no friction rub.   No murmur heard. Pulmonary/Chest: Effort normal and breath sounds normal. No accessory muscle usage. No respiratory distress. He has no rales.  Abdominal: Soft. He exhibits no distension and no mass. There is tenderness. There is no rebound and no guarding.       Epigastric tenderness no rebound or guarding.   Musculoskeletal: Normal range of motion.  Neurological: He is alert and oriented to person, place, and time.  Skin: Skin is warm and dry.  Psychiatric: He has a normal mood and affect.    ED Course  Procedures (including critical care time)   Labs Reviewed  LIPASE, BLOOD  COMPREHENSIVE METABOLIC PANEL  CBC  ETHANOL   Results for orders placed during the hospital encounter of 02/09/11  LIPASE, BLOOD      Component Value Range   Lipase 11  11 - 59 (U/L)  COMPREHENSIVE METABOLIC PANEL      Component Value Range   Sodium 134 (*) 135 - 145 (mEq/L)   Potassium 3.4 (*) 3.5 - 5.1 (mEq/L)   Chloride 96  96 - 112 (mEq/L)   CO2 26  19 - 32 (mEq/L)   Glucose, Bld 292 (*) 70 - 99 (mg/dL)   BUN 9  6 - 23 (mg/dL)   Creatinine, Ser 5.78  0.50 - 1.35 (mg/dL)   Calcium 8.9  8.4 - 46.9 (mg/dL)   Total Protein 7.4  6.0 - 8.3 (g/dL)   Albumin 3.8  3.5 - 5.2 (g/dL)   AST 35  0 - 37 (U/L)   ALT 37  0 - 53 (U/L)   Alkaline Phosphatase 82  39 - 117 (U/L)   Total Bilirubin 0.3  0.3 - 1.2 (mg/dL)   GFR calc non Af Amer >90  >90 (mL/min)   GFR calc Af Amer >90  >90 (mL/min)  CBC      Component Value Range   WBC 5.9  4.0 - 10.5 (K/uL)   RBC 4.66  4.22 - 5.81 (MIL/uL)   Hemoglobin 13.6  13.0 - 17.0 (g/dL)   HCT 62.9 (*) 52.8 - 52.0 (%)   MCV 82.6  78.0 - 100.0 (fL)   MCH 29.2  26.0 - 34.0 (pg)   MCHC 35.3  30.0 - 36.0 (g/dL)   RDW 41.3  24.4 - 01.0 (%)   Platelets 192  150 - 400 (K/uL)  ETHANOL      Component Value Range   Alcohol, Ethyl (B) 276 (*) 0 - 11 (mg/dL)       MDM  Iv ns.  protonix iv. zofran iv. Dilaudid .  5 mg iv.   Recheck pt comfortable. No nv.   k sl low, kcl po.   Recheck abd soft nt.   Pt declines etoh rehab/detox eval.       Suzi Roots, MD 02/09/11 1610  Suzi Roots, MD 02/09/11 [phone]

## 2011-02-09 NOTE — ED Notes (Signed)
Pt. Has on red socks and yellow arm bracelet. Pt. Ambulated to restroom with 1 assist.

## 2011-03-14 ENCOUNTER — Inpatient Hospital Stay (HOSPITAL_COMMUNITY)
Admission: EM | Admit: 2011-03-14 | Discharge: 2011-03-19 | DRG: 637 | Disposition: A | Discharge status: Home / Self Care | Payer: Self-pay | Attending: Internal Medicine | Admitting: Internal Medicine

## 2011-03-14 ENCOUNTER — Encounter (HOSPITAL_COMMUNITY): Payer: Self-pay | Admitting: Emergency Medicine

## 2011-03-14 DIAGNOSIS — K861 Other chronic pancreatitis: Secondary | ICD-10-CM | POA: Diagnosis present

## 2011-03-14 DIAGNOSIS — K863 Pseudocyst of pancreas: Secondary | ICD-10-CM

## 2011-03-14 DIAGNOSIS — F329 Major depressive disorder, single episode, unspecified: Secondary | ICD-10-CM

## 2011-03-14 DIAGNOSIS — E876 Hypokalemia: Secondary | ICD-10-CM | POA: Diagnosis present

## 2011-03-14 DIAGNOSIS — Z794 Long term (current) use of insulin: Secondary | ICD-10-CM

## 2011-03-14 DIAGNOSIS — Z6829 Body mass index (BMI) 29.0-29.9, adult: Secondary | ICD-10-CM

## 2011-03-14 DIAGNOSIS — M5412 Radiculopathy, cervical region: Secondary | ICD-10-CM

## 2011-03-14 DIAGNOSIS — G4700 Insomnia, unspecified: Secondary | ICD-10-CM

## 2011-03-14 DIAGNOSIS — E119 Type 2 diabetes mellitus without complications: Secondary | ICD-10-CM

## 2011-03-14 DIAGNOSIS — I10 Essential (primary) hypertension: Secondary | ICD-10-CM | POA: Diagnosis present

## 2011-03-14 DIAGNOSIS — N401 Enlarged prostate with lower urinary tract symptoms: Secondary | ICD-10-CM

## 2011-03-14 DIAGNOSIS — G40909 Epilepsy, unspecified, not intractable, without status epilepticus: Secondary | ICD-10-CM | POA: Diagnosis present

## 2011-03-14 DIAGNOSIS — R109 Unspecified abdominal pain: Secondary | ICD-10-CM | POA: Diagnosis present

## 2011-03-14 DIAGNOSIS — Z87891 Personal history of nicotine dependence: Secondary | ICD-10-CM

## 2011-03-14 DIAGNOSIS — E872 Acidosis, unspecified: Secondary | ICD-10-CM | POA: Diagnosis present

## 2011-03-14 DIAGNOSIS — K920 Hematemesis: Secondary | ICD-10-CM | POA: Diagnosis present

## 2011-03-14 DIAGNOSIS — E860 Dehydration: Secondary | ICD-10-CM

## 2011-03-14 DIAGNOSIS — E782 Mixed hyperlipidemia: Secondary | ICD-10-CM

## 2011-03-14 DIAGNOSIS — E1110 Type 2 diabetes mellitus with ketoacidosis without coma: Secondary | ICD-10-CM | POA: Diagnosis present

## 2011-03-14 DIAGNOSIS — Z862 Personal history of diseases of the blood and blood-forming organs and certain disorders involving the immune mechanism: Secondary | ICD-10-CM

## 2011-03-14 DIAGNOSIS — K859 Acute pancreatitis without necrosis or infection, unspecified: Secondary | ICD-10-CM | POA: Diagnosis present

## 2011-03-14 DIAGNOSIS — B9789 Other viral agents as the cause of diseases classified elsewhere: Secondary | ICD-10-CM

## 2011-03-14 DIAGNOSIS — K5900 Constipation, unspecified: Secondary | ICD-10-CM

## 2011-03-14 DIAGNOSIS — Z91199 Patient's noncompliance with other medical treatment and regimen due to unspecified reason: Secondary | ICD-10-CM

## 2011-03-14 DIAGNOSIS — E1310 Other specified diabetes mellitus with ketoacidosis without coma: Principal | ICD-10-CM | POA: Diagnosis present

## 2011-03-14 DIAGNOSIS — Z9119 Patient's noncompliance with other medical treatment and regimen: Secondary | ICD-10-CM

## 2011-03-14 DIAGNOSIS — Z885 Allergy status to narcotic agent status: Secondary | ICD-10-CM

## 2011-03-14 DIAGNOSIS — R569 Unspecified convulsions: Secondary | ICD-10-CM | POA: Diagnosis present

## 2011-03-14 DIAGNOSIS — E875 Hyperkalemia: Secondary | ICD-10-CM | POA: Diagnosis not present

## 2011-03-14 DIAGNOSIS — F1010 Alcohol abuse, uncomplicated: Secondary | ICD-10-CM | POA: Diagnosis present

## 2011-03-14 DIAGNOSIS — Z79899 Other long term (current) drug therapy: Secondary | ICD-10-CM

## 2011-03-14 HISTORY — DX: Other chronic pain: G89.29

## 2011-03-14 HISTORY — DX: Unspecified abdominal pain: R10.9

## 2011-03-14 NOTE — ED Notes (Signed)
PT. REPORTS MID/UPPER ABDOMINAL PAIN WITH VOMITTING AND DIARRHEA FOR 3 DAYS.  STATES HISTORY OF PANCREATITIS.

## 2011-03-15 ENCOUNTER — Emergency Department (HOSPITAL_COMMUNITY): Payer: Self-pay

## 2011-03-15 ENCOUNTER — Encounter (HOSPITAL_COMMUNITY): Payer: Self-pay | Admitting: Internal Medicine

## 2011-03-15 DIAGNOSIS — K861 Other chronic pancreatitis: Secondary | ICD-10-CM | POA: Diagnosis present

## 2011-03-15 DIAGNOSIS — E1110 Type 2 diabetes mellitus with ketoacidosis without coma: Secondary | ICD-10-CM | POA: Diagnosis present

## 2011-03-15 DIAGNOSIS — R109 Unspecified abdominal pain: Secondary | ICD-10-CM | POA: Diagnosis present

## 2011-03-15 DIAGNOSIS — K920 Hematemesis: Secondary | ICD-10-CM | POA: Diagnosis present

## 2011-03-15 LAB — BASIC METABOLIC PANEL
BUN: 14 mg/dL (ref 6–23)
BUN: 12 mg/dL (ref 6–23)
CO2: 13 mEq/L — ABNORMAL LOW (ref 19–32)
CO2: 17 mEq/L — ABNORMAL LOW (ref 19–32)
Calcium: 8.8 mg/dL (ref 8.4–10.5)
Calcium: 9.3 mg/dL (ref 8.4–10.5)
Chloride: 105 mEq/L (ref 96–112)
Creatinine, Ser: 0.74 mg/dL (ref 0.50–1.35)
GFR calc Af Amer: >90 mL/min (ref >90)
GFR calc Af Amer: >90 mL/min (ref >90)
GFR calc Af Amer: >90 mL/min (ref >90)
GFR calc non Af Amer: >90 mL/min (ref >90)
GFR calc non Af Amer: >90 mL/min (ref >90)
GFR calc non Af Amer: >90 mL/min (ref >90)
Glucose, Bld: 181 mg/dL — ABNORMAL HIGH (ref 70–99)
Glucose, Bld: 120 mg/dL — ABNORMAL HIGH (ref 70–99)
Potassium: 5.3 mEq/L — ABNORMAL HIGH (ref 3.5–5.1)
Potassium: 4 mEq/L (ref 3.5–5.1)
Sodium: 129 mEq/L — ABNORMAL LOW (ref 135–145)
Sodium: 131 mEq/L — ABNORMAL LOW (ref 135–145)
Sodium: 133 mEq/L — ABNORMAL LOW (ref 135–145)

## 2011-03-15 LAB — URINALYSIS, ROUTINE W REFLEX MICROSCOPIC
Glucose, UA: >1000 mg/dL — AB
Leukocytes, UA: NEGATIVE
Protein, ur: 100 mg/dL — AB
pH: 5 (ref 5.0–8.0)

## 2011-03-15 LAB — CARDIAC PANEL(CRET KIN+CKTOT+MB+TROPI)
CK, MB: 3 ng/mL (ref 0.3–4.0)
CK, MB: 3 ng/mL (ref 0.3–4.0)
Relative Index: 2.4 (ref 0.0–2.5)
Total CK: 135 U/L (ref 7–232)
Total CK: 124 U/L (ref 7–232)
Troponin I: <0.3 ng/mL (ref <0.30)

## 2011-03-15 LAB — POCT I-STAT 3, ART BLOOD GAS (G3+)
O2 Saturation: 83 %
TCO2: 8 mmol/L (ref 0–100)
pCO2 arterial: 25.5 mmHg — ABNORMAL LOW (ref 35.0–45.0)
pH, Arterial: 7.084 — CL (ref 7.350–7.450)
pO2, Arterial: 65 mmHg — ABNORMAL LOW (ref 80.0–100.0)

## 2011-03-15 LAB — COMPREHENSIVE METABOLIC PANEL
ALT: 27 U/L (ref 0–53)
AST: 18 U/L (ref 0–37)
Calcium: 9.6 mg/dL (ref 8.4–10.5)
GFR calc Af Amer: >90 mL/min (ref >90)
Sodium: 129 mEq/L — ABNORMAL LOW (ref 135–145)
Total Protein: 9.2 g/dL — ABNORMAL HIGH (ref 6.0–8.3)

## 2011-03-15 LAB — CBC
HCT: 46.6 % (ref 39.0–52.0)
HCT: 41.9 % (ref 39.0–52.0)
MCH: 29.7 pg (ref 26.0–34.0)
MCH: 30.2 pg (ref 26.0–34.0)
MCHC: 35.6 g/dL (ref 30.0–36.0)
MCHC: 36.6 g/dL — ABNORMAL HIGH (ref 30.0–36.0)
MCV: 84.6 fL (ref 78.0–100.0)
Platelets: 200 10*3/uL (ref 150–400)
Platelets: 203 10*3/uL (ref 150–400)
Platelets: 206 10*3/uL (ref 150–400)
Platelets: 256 10*3/uL (ref 150–400)
RBC: 5.14 MIL/uL (ref 4.22–5.81)
RBC: 5.1 MIL/uL (ref 4.22–5.81)
RDW: 12.3 % (ref 11.5–15.5)
RDW: 12.4 % (ref 11.5–15.5)
RDW: 12.4 % (ref 11.5–15.5)
WBC: 11.4 10*3/uL — ABNORMAL HIGH (ref 4.0–10.5)
WBC: 8.5 10*3/uL (ref 4.0–10.5)

## 2011-03-15 LAB — URINE MICROSCOPIC-ADD ON

## 2011-03-15 LAB — GLUCOSE, CAPILLARY
Glucose-Capillary: 364 mg/dL — ABNORMAL HIGH (ref 70–99)
Glucose-Capillary: 193 mg/dL — ABNORMAL HIGH (ref 70–99)
Glucose-Capillary: 196 mg/dL — ABNORMAL HIGH (ref 70–99)
Glucose-Capillary: 178 mg/dL — ABNORMAL HIGH (ref 70–99)
Glucose-Capillary: 150 mg/dL — ABNORMAL HIGH (ref 70–99)
Glucose-Capillary: 99 mg/dL (ref 70–99)

## 2011-03-15 LAB — DIFFERENTIAL
Basophils Absolute: 0 10*3/uL (ref 0.0–0.1)
Basophils Relative: 1 % (ref 0–1)
Eosinophils Absolute: 0 10*3/uL (ref 0.0–0.7)
Eosinophils Relative: 0 % (ref 0–5)

## 2011-03-15 LAB — RAPID URINE DRUG SCREEN, HOSP PERFORMED
Amphetamines: NOT DETECTED
Opiates: NOT DETECTED

## 2011-03-15 LAB — PHENYTOIN LEVEL, TOTAL: Phenytoin Lvl: <2.5 ug/mL — ABNORMAL LOW (ref 10.0–20.0)

## 2011-03-15 LAB — MRSA PCR SCREENING: MRSA by PCR: NEGATIVE

## 2011-03-15 MED ORDER — ONDANSETRON HCL 4 MG/2ML IJ SOLN
INTRAMUSCULAR | Status: AC
  Administered 2011-03-15: 4 mg via INTRAVENOUS
  Filled 2011-03-15: qty 2

## 2011-03-15 MED ORDER — DEXTROSE-NACL 5-0.45 % IV SOLN
INTRAVENOUS | Status: DC
  Administered 2011-03-15: 12:00:00 via INTRAVENOUS

## 2011-03-15 MED ORDER — VITAMIN B-1 100 MG PO TABS
100.0000 mg | ORAL_TABLET | Freq: Every day | ORAL | Status: DC
  Administered 2011-03-15 – 2011-03-19 (×6): 100 mg via ORAL
  Filled 2011-03-15 (×5): qty 1

## 2011-03-15 MED ORDER — SODIUM CHLORIDE 0.9 % IV SOLN: INTRAVENOUS | Status: DC

## 2011-03-15 MED ORDER — POTASSIUM CHLORIDE 10 MEQ/100ML IV SOLN
10.0000 meq | INTRAVENOUS | Status: DC
  Administered 2011-03-15 (×2): 10 meq via INTRAVENOUS
  Filled 2011-03-15: qty 300
  Filled 2011-03-15: qty 100

## 2011-03-15 MED ORDER — ONDANSETRON HCL 4 MG/2ML IJ SOLN
4.0000 mg | Freq: Once | INTRAMUSCULAR | Status: AC
  Administered 2011-03-15: 4 mg via INTRAVENOUS

## 2011-03-15 MED ORDER — LORAZEPAM 2 MG/ML IJ SOLN
1.0000 mg | Freq: Four times a day (QID) | INTRAMUSCULAR | Status: AC | PRN
  Administered 2011-03-16: 1 mg via INTRAVENOUS
  Filled 2011-03-15: qty 1

## 2011-03-15 MED ORDER — POTASSIUM CHLORIDE 10 MEQ/100ML IV SOLN
10.0000 meq | INTRAVENOUS | Status: AC
  Administered 2011-03-15 (×2): 10 meq via INTRAVENOUS

## 2011-03-15 MED ORDER — SODIUM CHLORIDE 0.9 % IV BOLUS (SEPSIS)
1000.0000 mL | Freq: Once | INTRAVENOUS | Status: AC
  Administered 2011-03-15: 1000 mL via INTRAVENOUS

## 2011-03-15 MED ORDER — HYDROMORPHONE HCL PF 1 MG/ML IJ SOLN
INTRAMUSCULAR | Status: AC
  Administered 2011-03-15: 1 mg via INTRAVENOUS
  Filled 2011-03-15: qty 1

## 2011-03-15 MED ORDER — DEXTROSE 50 % IV SOLN: 25.0000 mL | INTRAVENOUS | Status: DC | PRN

## 2011-03-15 MED ORDER — FOLIC ACID 1 MG PO TABS
1.0000 mg | ORAL_TABLET | Freq: Every day | ORAL | Status: DC
  Administered 2011-03-15 – 2011-03-19 (×5): 1 mg via ORAL
  Filled 2011-03-15 (×5): qty 1

## 2011-03-15 MED ORDER — INSULIN ASPART 100 UNIT/ML ~~LOC~~ SOLN
8.0000 [IU] | Freq: Once | SUBCUTANEOUS | Status: AC
  Administered 2011-03-15: 8 [IU] via SUBCUTANEOUS
  Filled 2011-03-15: qty 1

## 2011-03-15 MED ORDER — POTASSIUM CHLORIDE 10 MEQ/100ML IV SOLN
INTRAVENOUS | Status: AC
  Filled 2011-03-15: qty 200

## 2011-03-15 MED ORDER — LABETALOL HCL 5 MG/ML IV SOLN
10.0000 mg | INTRAVENOUS | Status: DC | PRN
  Administered 2011-03-15 – 2011-03-18 (×2): 10 mg via INTRAVENOUS
  Filled 2011-03-15 (×2): qty 4

## 2011-03-15 MED ORDER — SODIUM CHLORIDE 0.9 % IV SOLN
INTRAVENOUS | Status: AC
  Administered 2011-03-15: 08:00:00 via INTRAVENOUS
  Administered 2011-03-15: 3 [IU]/h via INTRAVENOUS
  Administered 2011-03-15: 4.9 [IU]/h via INTRAVENOUS
  Administered 2011-03-16: 3.5 [IU]/h via INTRAVENOUS
  Filled 2011-03-15 (×2): qty 1

## 2011-03-15 MED ORDER — HYDROMORPHONE HCL PF 1 MG/ML IJ SOLN
1.0000 mg | INTRAMUSCULAR | Status: DC | PRN
  Administered 2011-03-15 – 2011-03-17 (×22): 1 mg via INTRAVENOUS
  Filled 2011-03-15 (×21): qty 1

## 2011-03-15 MED ORDER — SODIUM CHLORIDE 0.9 % IV SOLN: INTRAVENOUS | Status: AC

## 2011-03-15 MED ORDER — LORAZEPAM 1 MG PO TABS: 1.0000 mg | ORAL_TABLET | Freq: Four times a day (QID) | ORAL | Status: AC | PRN

## 2011-03-15 MED ORDER — PANTOPRAZOLE SODIUM 40 MG IV SOLR
40.0000 mg | Freq: Two times a day (BID) | INTRAVENOUS | Status: DC
  Administered 2011-03-15 – 2011-03-16 (×3): 40 mg via INTRAVENOUS
  Filled 2011-03-15 (×4): qty 40

## 2011-03-15 MED ORDER — ONDANSETRON HCL 4 MG/2ML IJ SOLN
4.0000 mg | Freq: Four times a day (QID) | INTRAMUSCULAR | Status: DC | PRN
  Administered 2011-03-15 – 2011-03-17 (×4): 4 mg via INTRAVENOUS
  Filled 2011-03-15 (×4): qty 2

## 2011-03-15 MED ORDER — HYDROMORPHONE HCL PF 1 MG/ML IJ SOLN
INTRAMUSCULAR | Status: AC
  Filled 2011-03-15: qty 1

## 2011-03-15 MED ORDER — THIAMINE HCL 100 MG/ML IJ SOLN
100.0000 mg | Freq: Every day | INTRAMUSCULAR | Status: DC
  Filled 2011-03-15 (×2): qty 1

## 2011-03-15 MED ORDER — INFLUENZA VIRUS VACC SPLIT PF IM SUSP
0.5000 mL | INTRAMUSCULAR | Status: AC
  Administered 2011-03-16: 0.5 mL via INTRAMUSCULAR
  Filled 2011-03-15: qty 0.5

## 2011-03-15 MED ORDER — DEXTROSE-NACL 5-0.45 % IV SOLN
INTRAVENOUS | Status: DC
  Administered 2011-03-16: 125 mL/h via INTRAVENOUS

## 2011-03-15 MED ORDER — PHENYTOIN SODIUM EXTENDED 100 MG PO CAPS
200.0000 mg | ORAL_CAPSULE | Freq: Every day | ORAL | Status: DC
  Administered 2011-03-15 – 2011-03-18 (×4): 200 mg via ORAL
  Filled 2011-03-15 (×6): qty 2

## 2011-03-15 MED ORDER — HYDROMORPHONE HCL PF 1 MG/ML IJ SOLN
1.0000 mg | Freq: Once | INTRAMUSCULAR | Status: AC
  Administered 2011-03-15: 1 mg via INTRAVENOUS

## 2011-03-15 MED ORDER — SODIUM CHLORIDE 0.9 % IV SOLN
1250.0000 mg | Freq: Once | INTRAVENOUS | Status: AC
  Administered 2011-03-15: 1250 mg via INTRAVENOUS
  Filled 2011-03-15: qty 25

## 2011-03-15 MED ORDER — HYDROMORPHONE HCL PF 1 MG/ML IJ SOLN
1.0000 mg | Freq: Once | INTRAMUSCULAR | Status: AC
  Administered 2011-03-15: 1 mg via INTRAVENOUS
  Filled 2011-03-15: qty 1

## 2011-03-15 MED ORDER — PNEUMOCOCCAL VAC POLYVALENT 25 MCG/0.5ML IJ INJ
0.5000 mL | INJECTION | INTRAMUSCULAR | Status: AC
  Administered 2011-03-16: 0.5 mL via INTRAMUSCULAR
  Filled 2011-03-15: qty 0.5

## 2011-03-15 MED ORDER — ADULT MULTIVITAMIN W/MINERALS CH
1.0000 | ORAL_TABLET | Freq: Every day | ORAL | Status: DC
  Administered 2011-03-15 – 2011-03-19 (×5): 1 via ORAL
  Filled 2011-03-15 (×5): qty 1

## 2011-03-15 NOTE — ED Notes (Signed)
CBG 395 

## 2011-03-15 NOTE — H&P (Signed)
Gabriel Rush is an 51 y.o. male.   PCP - Dr.Vollmer. Chief Complaint: Abdominal pain. 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 Since then he's been having some epigastric pain which became more diffuse severe in intensity and frequency associated with nausea and vomiting eventually started having dark vomitus. In the ER patient was found to be having severe metabolic acidosis with high blood sugar. Patient's acute abdominal series not sure anything concerning. Patient has been admitted for DKA with abdominal pain probably from chronic pancreatitis exacerbation. Patient denies any chest pain short breath dizziness or loss of consciousness or focal deficits.   Past Medical History  Diagnosis Date  . Diabetes mellitus   . Hypertension   . Seizures   . Pancreatitis   . Alcohol abuse   . Chronic abdominal pain     Past Surgical History  Procedure Date  . Appendectomy     History reviewed. No pertinent family history. Social History:  reports that he quit smoking about 26 years ago. His smoking use included Cigarettes. He does not have any smokeless tobacco history on file. He reports that he drinks alcohol. He reports that he does not use illicit drugs.  Allergies:  Allergies  Allergen Reactions  . Morphine And Related Itching    itching    Medications Prior to Admission  Medication Dose Route Frequency Provider Last Rate Last Dose  . HYDROmorphone (DILAUDID) injection 1 mg  1 mg Intravenous Once Nathan R. Pickering, MD   1 mg at 03/15/11 0145  . HYDROmorphone (DILAUDID) injection 1 mg  1 mg Intravenous Once Nathan R. Pickering, MD   1 mg at 03/15/11 0358  . insulin aspart (novoLOG) injection 8 Units  8 Units Subcutaneous Once Harrold Donath R.  Rubin Payor, MD   8 Units at 03/15/11 0420  . ondansetron (ZOFRAN) injection 4 mg  4 mg Intravenous Once [employer]. Pickering, MD   4 mg at 03/15/11 0145  . ondansetron (ZOFRAN) injection 4 mg  4 mg Intravenous Once [employer]. Pickering, MD   4 mg at 03/15/11 0437  . sodium chloride 0.9 % bolus 1,000 mL  1,000 mL Intravenous Once Harrold Donath R. Pickering, MD   1,000 mL at 03/15/11 0146  . sodium chloride 0.9 % bolus 1,000 mL  1,000 mL Intravenous Once Harrold Donath R. Pickering, MD   1,000 mL at 03/15/11 0358   Medications Prior to Admission  Medication Sig Dispense Refill  . acetaminophen (TYLENOL) 500 MG tablet Take 1,000 mg by mouth every 6 (six) hours as needed. For pain      . amylase-lipase-protease (PANGESTYME CN-20) 66.05-27-73 MU per capsule Take 1 capsule by mouth 3 (three) times daily with meals. Usually eats 2 large meals per day      . cephALEXin (KEFLEX) 500 MG capsule Take 500 mg by mouth 4 (four) times daily.        Marland Kitchen doxycycline (VIBRAMYCIN) 100 MG capsule Take 100 mg by mouth 2 (two) times daily.        Marland Kitchen gabapentin (NEURONTIN) 300 MG capsule Take 300 mg by mouth 3 (three) times daily.        . hydrochlorothiazide (HYDRODIURIL) 25 MG tablet Take 25 mg by mouth daily.      Marland Kitchen ibuprofen (ADVIL,MOTRIN) 200 MG  tablet Take 200-400 mg by mouth every 6 (six) hours as needed. pain       . insulin aspart (NOVOLOG) 100 UNIT/ML injection Inject 14-18 Units into the skin 3 (three) times daily before meals. Based on sliding scale      . Insulin Glargine (LANTUS [place]) Inject 40 Units into the skin at bedtime.       Marland Kitchen lisinopril (PRINIVIL,ZESTRIL) 10 MG tablet Take 10 mg by mouth daily.        Marland Kitchen omeprazole (PRILOSEC) 20 MG capsule Take 1 capsule (20 mg total) by mouth daily.  20 capsule  0  . oxyCODONE-acetaminophen (PERCOCET) 5-325 MG per tablet Take 1-2 tablets by mouth every 4 (four) hours as needed. pain       . pantoprazole (PROTONIX) 40 MG tablet Take 40 mg by mouth daily.        . phenytoin (DILANTIN) 100  MG ER capsule Take 200 mg by mouth at bedtime.          Results for orders placed during the hospital encounter of 03/14/11 (from the past 48 hour(s))  CBC     Status: Normal   Collection Time   03/15/11 12:03 AM      Component Value Range Comment   WBC 8.5  4.0 - 10.5 (K/uL)    RBC 5.59  4.22 - 5.81 (MIL/uL)    Hemoglobin 16.6  13.0 - 17.0 (g/dL)    HCT 40.9  81.1 - 91.4 (%)    MCV 84.6  78.0 - 100.0 (fL)    MCH 29.7  26.0 - 34.0 (pg)    MCHC 35.1  30.0 - 36.0 (g/dL)    RDW 78.2  95.6 - 21.3 (%)    Platelets 256  150 - 400 (K/uL)   DIFFERENTIAL     Status: Abnormal   Collection Time   03/15/11 12:03 AM      Component Value Range Comment   Neutrophils Relative 87 (*) 43 - 77 (%)    Neutro Abs 7.4  1.7 - 7.7 (K/uL)    Lymphocytes Relative 10 (*) 12 - 46 (%)    Lymphs Abs 0.8  0.7 - 4.0 (K/uL)    Monocytes Relative 3  3 - 12 (%)    Monocytes Absolute 0.2  0.1 - 1.0 (K/uL)    Eosinophils Relative 0  0 - 5 (%)    Eosinophils Absolute 0.0  0.0 - 0.7 (K/uL)    Basophils Relative 1  0 - 1 (%)    Basophils Absolute 0.0  0.0 - 0.1 (K/uL)   COMPREHENSIVE METABOLIC PANEL     Status: Abnormal   Collection Time   03/15/11 12:03 AM      Component Value Range Comment   Sodium 129 (*) 135 - 145 (mEq/L)    Potassium 4.8  3.5 - 5.1 (mEq/L)    Chloride 88 (*) 96 - 112 (mEq/L)    CO2 6 (*) 19 - 32 (mEq/L)    Glucose, Bld 430 (*) 70 - 99 (mg/dL)    BUN 16  6 - 23 (mg/dL)    Creatinine, Ser 0.86  0.50 - 1.35 (mg/dL)    Calcium 9.6  8.4 - 10.5 (mg/dL)    Total Protein 9.2 (*) 6.0 - 8.3 (g/dL)    Albumin 4.7  3.5 - 5.2 (g/dL)    AST 18  0 - 37 (U/L)    ALT 27  0 - 53 (U/L)    Alkaline Phosphatase 115  39 -  117 (U/L)    Total Bilirubin 0.5  0.3 - 1.2 (mg/dL)    GFR calc non Af Amer >90  >90 (mL/min)    GFR calc Af Amer >90  >90 (mL/min)   LIPASE, BLOOD     Status: Normal   Collection Time   03/15/11 12:03 AM      Component Value Range Comment   Lipase 16  11 - 59 (U/L)   LACTIC ACID, PLASMA      Status: Abnormal   Collection Time   03/15/11  1:40 AM      Component Value Range Comment   Lactic Acid, Venous 3.5 (*) 0.5 - 2.2 (mmol/L)   ETHANOL     Status: Normal   Collection Time   03/15/11  1:41 AM      Component Value Range Comment   Alcohol, Ethyl (B) <11  0 - 11 (mg/dL)   URINALYSIS, ROUTINE W REFLEX MICROSCOPIC     Status: Abnormal   Collection Time   03/15/11  2:34 AM      Component Value Range Comment   Color, Urine YELLOW  YELLOW     APPearance CLEAR  CLEAR     Specific Gravity, Urine 1.028  1.005 - 1.030     pH 5.0  5.0 - 8.0     Glucose, UA >1000 (*) NEGATIVE (mg/dL)    Hgb urine dipstick TRACE (*) NEGATIVE     Bilirubin Urine NEGATIVE  NEGATIVE     Ketones, ur >80 (*) NEGATIVE (mg/dL)    Protein, ur 308 (*) NEGATIVE (mg/dL)    Urobilinogen, UA 0.2  0.0 - 1.0 (mg/dL)    Nitrite NEGATIVE  NEGATIVE     Leukocytes, UA NEGATIVE  NEGATIVE    URINE RAPID DRUG SCREEN (HOSP PERFORMED)     Status: Normal   Collection Time   03/15/11  2:34 AM      Component Value Range Comment   Opiates NONE DETECTED  NONE DETECTED     Cocaine NONE DETECTED  NONE DETECTED     Benzodiazepines NONE DETECTED  NONE DETECTED     Amphetamines NONE DETECTED  NONE DETECTED     Tetrahydrocannabinol NONE DETECTED  NONE DETECTED     Barbiturates NONE DETECTED  NONE DETECTED    URINE MICROSCOPIC-ADD ON     Status: Abnormal   Collection Time   03/15/11  2:34 AM      Component Value Range Comment   WBC, UA 0-2  <3 (WBC/hpf)    RBC / HPF 3-6  <3 (RBC/hpf)    Bacteria, UA RARE  RARE     Casts HYALINE CASTS (*) NEGATIVE    POCT I-STAT 3, BLOOD GAS (G3+)     Status: Abnormal   Collection Time   03/15/11  3:22 AM      Component Value Range Comment   pH, Arterial 7.084 (*) 7.350 - 7.450     pCO2 arterial 25.5 (*) 35.0 - 45.0 (mmHg)    pO2, Arterial 65.0 (*) 80.0 - 100.0 (mmHg)    Bicarbonate 7.6 (*) 20.0 - 24.0 (mEq/L)    TCO2 8  0 - 100 (mmol/L)    O2 Saturation 83.0      Acid-base deficit 21.0 (*) 0.0  - 2.0 (mmol/L)    Collection site RADIAL, ALLEN'S TEST ACCEPTABLE      Drawn by Operator      Sample type ARTERIAL      Comment MD NOTIFIED, REPEAT TEST  Dg Abd Acute W/chest  03/15/2011  *RADIOLOGY REPORT*  Clinical Data: Abdominal pain  ACUTE ABDOMEN SERIES (ABDOMEN 2 VIEW & CHEST 1 VIEW)  Comparison: 10/23/2010  Findings: Lungs are clear.  Heart size normal.  No effusion.  No free air.  Normal bowel gas pattern.  No abnormal abdominal calcifications.  Regional bones unremarkable.  IMPRESSION:  1.  Normal bowel gas pattern. 2.  No free air. 3.  No acute cardiopulmonary disease.  Original Report Authenticated By: Osa Craver, M.D.    Review of Systems  Constitutional: Negative.   HENT: Negative.   Eyes: Negative.   Respiratory: Negative.   Cardiovascular: Negative.   Gastrointestinal: Positive for nausea, vomiting and abdominal pain.  Genitourinary: Negative.   Musculoskeletal: Negative.   Skin: Negative.   Neurological: Negative.   Endo/Heme/Allergies: Negative.   Psychiatric/Behavioral: Negative.     Blood pressure 157/92, pulse 124, temperature 98.1 F (36.7 C), temperature source Oral, resp. rate 20, SpO2 100.00%. Physical Exam  Constitutional: He is oriented to person, place, and time. He appears well-developed and well-nourished. No distress.  HENT:  Head: Normocephalic and atraumatic.  Right Ear: External ear normal.  Left Ear: External ear normal.  Mouth/Throat: No oropharyngeal exudate.  Eyes: Conjunctivae are normal. Pupils are equal, round, and reactive to light. Right eye exhibits no discharge. Left eye exhibits no discharge. No scleral icterus.  Neck: Normal range of motion. Neck supple.  Cardiovascular:       Sinus tachycardia.  Respiratory: Effort normal and breath sounds normal. No respiratory distress. He has no wheezes. He has no rales.  GI: He exhibits no distension. There is tenderness. There is no rebound and no guarding.  Musculoskeletal:  Normal range of motion. He exhibits no edema and no tenderness.  Neurological: He is alert and oriented to person, place, and time.       Moves upper and lower extremity.  Skin: Skin is warm and dry. No rash noted. He is not diaphoretic. No erythema.  Psychiatric: His behavior is normal.     Assessment/Plan  #1. DKA with severe metabolic acidosis  - at this time ER physician had consulted critical care and they have requested hospitalist admission. At this time patient be placed on DKA protocol with IV insulin with frequent metabolic panel checks. Aggressive IV hydration and recheck lactic acid and another few hours after hydration. #2. Abdominal pain from exacerbation of his chronic pancreatitis due to alcoholism  - patient will be kept n.p.o. with the pain relief medications. Once his nausea vomiting gets better and DKA gets controlled patient will be started on diet. If pain doesn't improve may have to get a CAT scan of the abdomen and pelvis. #3. Possible upper GI bleed from possible alcoholic gastritis  - we'll keep patient n.p.o. recheck CBC to make sure there's no drop in his hemoglobin. Patient will be placed on protonix IV. #4. History of hypertension - at this time as patient is n.p.o. patient will be kept on labetalol IV when necessary. #5. History of seizures from alcohol withdrawal  - have requested pharmacy to dose and monitor Dilantin. #6. History of alcoholism - we'll keep patient on alcohol withdrawal protocol.  CODE STATUS  - full code.   Maxx Pham N. 03/15/2011, 4:41 AM

## 2011-03-15 NOTE — ED Notes (Signed)
Critical lab: CO2 6. Will notify MD

## 2011-03-15 NOTE — Progress Notes (Signed)
CRITICAL VALUE ALERT  Critical value received:  K- 6.5 and CO2 6  Date of notification:  03/15/11   Time of notification:  0850  Critical value read back:yes  Nurse who received alert:  Oda Cogan  MD notified (1st page):  Dr. Butler Denmark  Time of first page:  [phone]  MD notified (2nd page):  Time of second page:  Responding MD:  Dr. Butler Denmark  Time MD responded:  [phone]

## 2011-03-15 NOTE — ED Provider Notes (Signed)
History     CSN: [id number]  Arrival date & time 03/14/11  2354   First MD Initiated Contact with Patient 03/15/11 0121      Chief Complaint  Patient presents with  . Abdominal Pain    (Consider location/radiation/quality/duration/timing/severity/associated sxs/prior treatment) Patient is a 51 y.o. male presenting with abdominal pain. The history is provided by the patient.  Abdominal Pain The primary symptoms of the illness include abdominal pain, nausea and vomiting. The primary symptoms of the illness do not include shortness of breath or diarrhea.  Symptoms associated with the illness do not include back pain.   patient has had upper abdominal pain and vomiting for 3 days. Patient states no diarrhea, but the nurses notes as diarrhea. He is a history of pancreatitis and pancreatic pseudocyst. He has severe pain now. No fevers. he states that he last drank 3 days ago.  Past Medical History  Diagnosis Date  . Diabetes mellitus   . Hypertension   . Seizures   . Pancreatitis   . Alcohol abuse   . Chronic abdominal pain     Past Surgical History  Procedure Date  . Appendectomy     History reviewed. No pertinent family history.  History  Substance Use Topics  . Smoking status: Former Smoker    Types: Cigarettes    Quit date: 12/21/1984  . Smokeless tobacco: Not on file  . Alcohol Use: Yes     heavy drinker: binge drinking per pt      Review of Systems  Constitutional: Negative for activity change and appetite change.  HENT: Negative for neck stiffness.   Eyes: Negative for pain.  Respiratory: Negative for chest tightness and shortness of breath.   Cardiovascular: Negative for chest pain and leg swelling.  Gastrointestinal: Positive for nausea, vomiting and abdominal pain. Negative for diarrhea.  Genitourinary: Negative for flank pain.  Musculoskeletal: Negative for back pain.  Skin: Negative for rash.  Neurological: Negative for weakness, numbness and  headaches.  Psychiatric/Behavioral: Negative for behavioral problems.    Allergies  Morphine and related  Home Medications   Current Outpatient Rx  Name Route Sig Dispense Refill  . ACETAMINOPHEN 500 MG PO TABS Oral Take 1,000 mg by mouth every 6 (six) hours as needed. For pain    . AMYLASE-LIPASE-PROTEASE 66.05-27-73 MU PO CPEP Oral Take 1 capsule by mouth 3 (three) times daily with meals. Usually eats 2 large meals per day    . CEPHALEXIN 500 MG PO CAPS Oral Take 500 mg by mouth 4 (four) times daily.      Marland Kitchen DOXYCYCLINE HYCLATE 100 MG PO CAPS Oral Take 100 mg by mouth 2 (two) times daily.      Marland Kitchen GABAPENTIN 300 MG PO CAPS Oral Take 300 mg by mouth 3 (three) times daily.      Marland Kitchen HYDROCHLOROTHIAZIDE 25 MG PO TABS Oral Take 25 mg by mouth daily.    . IBUPROFEN 200 MG PO TABS Oral Take 200-400 mg by mouth every 6 (six) hours as needed. pain     . INSULIN ASPART 100 UNIT/ML [place] SOLN Subcutaneous Inject 14-18 Units into the skin 3 (three) times daily before meals. Based on sliding scale    . LANTUS [place] Subcutaneous Inject 40 Units into the skin at bedtime.     Marland Kitchen LISINOPRIL 10 MG PO TABS Oral Take 10 mg by mouth daily.      Marland Kitchen OMEPRAZOLE 20 MG PO CPDR Oral Take 1 capsule (20 mg total) by mouth  daily. 20 capsule 0  . OXYCODONE-ACETAMINOPHEN 5-325 MG PO TABS Oral Take 1-2 tablets by mouth every 4 (four) hours as needed. pain     . PANTOPRAZOLE SODIUM 40 MG PO TBEC Oral Take 40 mg by mouth daily.      Marland Kitchen PHENYTOIN SODIUM EXTENDED 100 MG PO CAPS Oral Take 200 mg by mouth at bedtime.        BP 157/92  Pulse 124  Temp(Src) 98.1 F (36.7 C) (Oral)  Resp 20  SpO2 100%  Physical Exam  Nursing note and vitals reviewed. Constitutional: He is oriented to person, place, and time. He appears well-developed and well-nourished.  HENT:  Head: Normocephalic and atraumatic.  Eyes: EOM are normal. Pupils are equal, round, and reactive to light.  Neck: Normal range of motion. Neck supple.  Cardiovascular:  Regular rhythm and normal heart sounds.   No murmur heard.      Tachycardic  Pulmonary/Chest: Effort normal and breath sounds normal.  Abdominal: Soft. Bowel sounds are normal. He exhibits no distension and no mass. There is tenderness. There is no rebound and no guarding.  Musculoskeletal: Normal range of motion. He exhibits no edema.  Neurological: He is alert and oriented to person, place, and time. No cranial nerve deficit.  Skin: Skin is warm and dry.  Psychiatric: He has a normal mood and affect.    ED Course  Procedures (including critical care time)  Labs Reviewed  DIFFERENTIAL - Abnormal; Notable for the following:    Neutrophils Relative 87 (*)    Lymphocytes Relative 10 (*)    All other components within normal limits  COMPREHENSIVE METABOLIC PANEL - Abnormal; Notable for the following:    Sodium 129 (*)    Chloride 88 (*)    CO2 6 (*)    Glucose, Bld 430 (*)    Total Protein 9.2 (*)    All other components within normal limits  LACTIC ACID, PLASMA - Abnormal; Notable for the following:    Lactic Acid, Venous 3.5 (*)    All other components within normal limits  URINALYSIS, ROUTINE W REFLEX MICROSCOPIC - Abnormal; Notable for the following:    Glucose, UA >1000 (*)    Hgb urine dipstick TRACE (*)    Ketones, ur >80 (*)    Protein, ur 100 (*)    All other components within normal limits  URINE MICROSCOPIC-ADD ON - Abnormal; Notable for the following:    Casts HYALINE CASTS (*)    All other components within normal limits  POCT I-STAT 3, BLOOD GAS (G3+) - Abnormal; Notable for the following:    pH, Arterial 7.084 (*)    pCO2 arterial 25.5 (*)    pO2, Arterial 65.0 (*)    Bicarbonate 7.6 (*)    Acid-base deficit 21.0 (*)    All other components within normal limits  CBC  LIPASE, BLOOD  URINE RAPID DRUG SCREEN (HOSP PERFORMED)  ETHANOL   Dg Abd Acute W/chest  03/15/2011  *RADIOLOGY REPORT*  Clinical Data: Abdominal pain  ACUTE ABDOMEN SERIES (ABDOMEN 2 VIEW &  CHEST 1 VIEW)  Comparison: 10/23/2010  Findings: Lungs are clear.  Heart size normal.  No effusion.  No free air.  Normal bowel gas pattern.  No abnormal abdominal calcifications.  Regional bones unremarkable.  IMPRESSION:  1.  Normal bowel gas pattern. 2.  No free air. 3.  No acute cardiopulmonary disease.  Original Report Authenticated By: Osa Craver, M.D.     1. DKA (diabetic  ketoacidoses)   2. Dehydration   3. Abdominal pain   4. Lactic acidosis    CRITICAL CARE Performed by: Billee Cashing.   Total critical care time: 30   Critical care time was exclusive of separately billable procedures and treating other patients.  Critical care was necessary to treat or prevent imminent or life-threatening deterioration.  Critical care was time spent personally by me on the following activities: development of treatment plan with patient and/or surrogate as well as nursing, discussions with consultants, evaluation of patient's response to treatment, examination of patient, obtaining history from patient or surrogate, ordering and performing treatments and interventions, ordering and review of laboratory studies, ordering and review of radiographic studies, pulse oximetry and re-evaluation of patient's condition.   MDM  Abdominal pain and a history of pancreatitis and chronic abdominal pain. Said nausea vomiting diarrhea. He is tachycardic. He got lactic acidosis and an elevated glucose. He appears to be in DKA also. He does not have an obstruction on x-ray. His venous pH is 7.1. He'll be admitted to medicine. Critical care was contacted, and he thought that it could be medicine admission.        Juliet Rude. Rubin Payor, MD 03/15/11 [phone]

## 2011-03-15 NOTE — Progress Notes (Addendum)
MEDICATION RELATED CONSULT NOTE - INITIAL   Pharmacy Consult for Phenytoin Indication: h/o seizures  Allergies  Allergen Reactions  . Morphine And Related Itching    itching    Patient Measurements:  Ht: 70 inches Wt 95 kg  Vital Signs: Temp: 97.9 F (36.6 C) (02/05 0534) Temp src: Axillary (02/05 0534) BP: 173/111 mmHg (02/05 0534) Pulse Rate: 125  (02/05 0534) Intake/Output from previous day: 02/04 0701 - 02/05 0700 In: -  Out: 600 [Urine:600] Intake/Output from this shift: Total I/O In: -  Out: 600 [Urine:600]  Labs:  [facility] 03/15/11 0003  WBC 8.5  HGB 16.6  HCT 47.3  PLT 256  APTT --  CREATININE 0.89  LABCREA --  CREATININE 0.89  CREAT24HRUR --  MG --  PHOS --  ALBUMIN 4.7  PROT 9.2*  ALBUMIN 4.7  AST 18  ALT 27  ALKPHOS 115  BILITOT 0.5  BILIDIR --  IBILI --   The CrCl is unknown because both a height and weight (above a minimum accepted value) are required for this calculation.   Microbiology: No results found for this or any previous visit (from the past 720 hour(s)).  Medical History: Past Medical History  Diagnosis Date  . Diabetes mellitus   . Hypertension   . Seizures   . Pancreatitis   . Alcohol abuse   . Chronic abdominal pain     Medications:  Prescriptions prior to admission  Medication Sig Dispense Refill  . acetaminophen (TYLENOL) 500 MG tablet Take 1,000 mg by mouth every 6 (six) hours as needed. For pain      . amylase-lipase-protease (PANGESTYME CN-20) 66.05-27-73 MU per capsule Take 1 capsule by mouth 3 (three) times daily with meals. Usually eats 2 large meals per day      . cephALEXin (KEFLEX) 500 MG capsule Take 500 mg by mouth 4 (four) times daily.        Marland Kitchen doxycycline (VIBRAMYCIN) 100 MG capsule Take 100 mg by mouth 2 (two) times daily.        Marland Kitchen gabapentin (NEURONTIN) 300 MG capsule Take 300 mg by mouth 3 (three) times daily.        . hydrochlorothiazide (HYDRODIURIL) 25 MG tablet Take 25 mg by mouth daily.        Marland Kitchen ibuprofen (ADVIL,MOTRIN) 200 MG tablet Take 200-400 mg by mouth every 6 (six) hours as needed. pain       . insulin aspart (NOVOLOG) 100 UNIT/ML injection Inject 14-18 Units into the skin 3 (three) times daily before meals. Based on sliding scale      . Insulin Glargine (LANTUS [place]) Inject 40 Units into the skin at bedtime.       Marland Kitchen lisinopril (PRINIVIL,ZESTRIL) 10 MG tablet Take 10 mg by mouth daily.        Marland Kitchen omeprazole (PRILOSEC) 20 MG capsule Take 1 capsule (20 mg total) by mouth daily.  20 capsule  0  . oxyCODONE-acetaminophen (PERCOCET) 5-325 MG per tablet Take 1-2 tablets by mouth every 4 (four) hours as needed. pain       . pantoprazole (PROTONIX) 40 MG tablet Take 40 mg by mouth daily.        . phenytoin (DILANTIN) 100 MG ER capsule Take 200 mg by mouth at bedtime.          Assessment: 51 y.o. male presents with abd pain/N/V. Pt with h/o seizures - currently on 200mg  dilantin qHS. No seizure activity reported recently. Dilantin level currently pending.  Goal of  Therapy:  Dilantin level 10-20 mcg/ml  Plan:  1. F/u pending dilantin level and assess need for load.  Lavonia Dana 03/15/2011,6:47 AM   Addum:  Dilantin level <2.5 mg/L.  Will load with 1250 mg and recheck level am 2/6.  Talbert Cage, PharmD

## 2011-03-15 NOTE — Progress Notes (Signed)
Subjective: Abdominal pain has improved. He is not longer vomiting blood. He has been told in the past that the alcohol caused his pancreatitis episodes but he continues episodic drinking. He started drinking this time on Saturday and continued on Sunday. He stopped taking his insulin when he started drinking as he was not eating much. He began to vomit dark liquid on Sumday evening and this continued while he was in the ER. His last episode was early this AM in the ER. He has no history of PUD and has not recently been having epigastric pain or heartburn. He feels a burning in his chest after he vomits. He states that his upper abdominal pain goes around his chest to his back.   Objective: Blood pressure 110/83, pulse 120, temperature 98.6 F (37 C), temperature source Oral, resp. rate 18, height 5\' 11"  (1.803 m), weight 93 kg (205 lb 0.4 oz), SpO2 98.00%. Weight change:   Intake/Output Summary (Last 24 hours) at 03/15/11 1325 Last data filed at 03/15/11 1314  Gross per 24 hour  Intake 1356.16 ml  Output   1750 ml  Net -393.84 ml    Physical Exam: General appearance: alert and cooperative Throat: lips, mucosa, and tongue normal; teeth and gums normal Lungs: clear to auscultation bilaterally Heart: regular rate and rhythm, S1, S2 normal, no murmur, click, rub or gallop Abdomen: soft, mildly tender in epigastrium; bowel sounds normal; no masses,  no organomegaly Extremities: extremities normal, atraumatic, no cyanosis or edema  Lab Results:  Basename 03/15/11 1015 03/15/11 0730  NA 131* 129*  K 5.3* 6.5*  CL 98 93*  CO2 9* 6*  GLUCOSE 323* 384*  BUN 19 19  CREATININE 0.86 0.87  CALCIUM 8.8 8.8  MG -- --  PHOS -- --    Basename 03/15/11 0003  AST 18  ALT 27  ALKPHOS 115  BILITOT 0.5  PROT 9.2*  ALBUMIN 4.7    Basename 03/15/11 0003  LIPASE 16  AMYLASE --    Basename 03/15/11 0730 03/15/11 0003  WBC 12.8* 8.5  NEUTROABS -- 7.4  HGB 16.6 16.6  HCT 46.6 47.3  MCV  84.9 84.6  PLT 203 256    Basename 03/15/11 0730  CKTOTAL 135  CKMB 3.0  CKMBINDEX --  TROPONINI <0.30   No components found with this basename: POCBNP:3 No results found for this basename: DDIMER:2 in the last 72 hours No results found for this basename: HGBA1C:2 in the last 72 hours No results found for this basename: CHOL:2,HDL:2,LDLCALC:2,TRIG:2,CHOLHDL:2,LDLDIRECT:2 in the last 72 hours No results found for this basename: TSH,T4TOTAL,FREET3,T3FREE,THYROIDAB in the last 72 hours No results found for this basename: VITAMINB12:2,FOLATE:2,FERRITIN:2,TIBC:2,IRON:2,RETICCTPCT:2 in the last 72 hours  Micro Results: Recent Results (from the past 240 hour(s))  MRSA PCR SCREENING     Status: Normal   Collection Time   03/15/11  6:31 AM      Component Value Range Status Comment   MRSA by PCR NEGATIVE  NEGATIVE  Final     Studies/Results: Dg Abd Acute W/chest  03/15/2011  *RADIOLOGY REPORT*  Clinical Data: Abdominal pain  ACUTE ABDOMEN SERIES (ABDOMEN 2 VIEW & CHEST 1 VIEW)  Comparison: 10/23/2010  Findings: Lungs are clear.  Heart size normal.  No effusion.  No free air.  Normal bowel gas pattern.  No abnormal abdominal calcifications.  Regional bones unremarkable.  IMPRESSION:  1.  Normal bowel gas pattern. 2.  No free air. 3.  No acute cardiopulmonary disease.  Original Report Authenticated By: Lysle Rubens  HASSELL III, M.D.    Medications: Scheduled Meds:    . folic acid  1 mg Oral Daily  . HYDROmorphone      .  HYDROmorphone (DILAUDID) injection  1 mg Intravenous Once  .  HYDROmorphone (DILAUDID) injection  1 mg Intravenous Once  . influenza  inactive virus vaccine  0.5 mL Intramuscular Tomorrow-1000  . insulin aspart  8 Units Subcutaneous Once  . mulitivitamin with minerals  1 tablet Oral Daily  . ondansetron  4 mg Intravenous Once  . ondansetron  4 mg Intravenous Once  . pantoprazole (PROTONIX) IV  40 mg Intravenous Q12H  . phenytoin (DILANTIN) IV  1,250 mg Intravenous Once  .  phenytoin  200 mg Oral QHS  . pneumococcal 23 valent vaccine  0.5 mL Intramuscular Tomorrow-1000  . sodium chloride  1,000 mL Intravenous Once  . sodium chloride  1,000 mL Intravenous Once  . thiamine  100 mg Oral Daily   Or  . thiamine  100 mg Intravenous Daily  . DISCONTD: potassium chloride  10 mEq Intravenous Q1H   Continuous Infusions:    . sodium chloride 250 mL/hr at 03/15/11 0900  . dextrose 5 % and 0.45% NaCl    . insulin (NOVOLIN-R) infusion 8.2 mL/hr at 03/15/11 1322  . DISCONTD: sodium chloride Stopped (03/15/11 1141)  . DISCONTD: dextrose 5 % and 0.45% NaCl 125 mL/hr at 03/15/11 1200   PRN Meds:.dextrose, HYDROmorphone (DILAUDID) injection, labetalol, LORazepam, LORazepam, ondansetron (ZOFRAN) IV  Assessment/Plan: Principal Problem:  *DKA, type 2- very acidotic. Cont insulin drip and cont checking bmet- will change from Q2 to Q4 hrs. He is on clear calorie free liquids for now until sugars improve. Will closely monitor and treat electrolyte abnormalities.   Active Problems:  Abdominal pain- may be secondary to gastritis vs peptic ulcers vs acute on chronic pancreatitis (as his pain goes to the back) - will cont IV BID protonix. Unfortunately if he continues to drink, this will remain a chronic issue.    Hematemesis- he has stopped bleeding for now. I will cont IV Protonix for now and keep on clear liquids for at least another 24 hrs. If bleeding recurs, will need to call GI.    Chronic pancreatitis- as above, needs to stop drinking. REsume    ALCOHOL ABUSE- since he admits to only binge drinking, he may not go through ETOH withdrawal. None the less, will cont to follow a CIWA scale.   Hyperkalemia- K was being supplemented in IVF and has since been discontinued. Repeat K+ is improved.    SEIZURE DISORDER- cont Dilantin. Level was low and he was loaded with IV Dilantin. Follow levels. May need to adjust dosage or switch him over to Keppra if he can afford it. It has  less side effects and we will not to check levels.   HTN- Lisinopril and HCTZ on hold while we rehydrate him. He is on PRN Labetalol.     LOS: 1 day   [facility] [phone] 03/15/2011, 1:25 PM

## 2011-03-16 LAB — GLUCOSE, CAPILLARY
Glucose-Capillary: 120 mg/dL — ABNORMAL HIGH (ref 70–99)
Glucose-Capillary: 137 mg/dL — ABNORMAL HIGH (ref 70–99)
Glucose-Capillary: 156 mg/dL — ABNORMAL HIGH (ref 70–99)
Glucose-Capillary: 158 mg/dL — ABNORMAL HIGH (ref 70–99)
Glucose-Capillary: 161 mg/dL — ABNORMAL HIGH (ref 70–99)
Glucose-Capillary: 188 mg/dL — ABNORMAL HIGH (ref 70–99)
Glucose-Capillary: 188 mg/dL — ABNORMAL HIGH (ref 70–99)
Glucose-Capillary: 172 mg/dL — ABNORMAL HIGH (ref 70–99)
Glucose-Capillary: 140 mg/dL — ABNORMAL HIGH (ref 70–99)
Glucose-Capillary: 220 mg/dL — ABNORMAL HIGH (ref 70–99)
Glucose-Capillary: 112 mg/dL — ABNORMAL HIGH (ref 70–99)
Glucose-Capillary: 148 mg/dL — ABNORMAL HIGH (ref 70–99)

## 2011-03-16 LAB — BASIC METABOLIC PANEL
CO2: 19 mEq/L (ref 19–32)
CO2: 21 mEq/L (ref 19–32)
Calcium: 9.3 mg/dL (ref 8.4–10.5)
Calcium: 8.7 mg/dL (ref 8.4–10.5)
Chloride: 102 mEq/L (ref 96–112)
Chloride: 97 mEq/L (ref 96–112)
GFR calc Af Amer: >90 mL/min (ref >90)
Glucose, Bld: 234 mg/dL — ABNORMAL HIGH (ref 70–99)
Sodium: 134 mEq/L — ABNORMAL LOW (ref 135–145)
Sodium: 130 mEq/L — ABNORMAL LOW (ref 135–145)

## 2011-03-16 MED ORDER — INSULIN GLARGINE 100 UNIT/ML ~~LOC~~ SOLN
20.0000 [IU] | Freq: Once | SUBCUTANEOUS | Status: AC
  Administered 2011-03-16: 20 [IU] via SUBCUTANEOUS
  Filled 2011-03-16 (×2): qty 3

## 2011-03-16 MED ORDER — PANTOPRAZOLE SODIUM 40 MG PO TBEC
40.0000 mg | DELAYED_RELEASE_TABLET | Freq: Every day | ORAL | Status: DC
  Administered 2011-03-17 – 2011-03-19 (×3): 40 mg via ORAL
  Filled 2011-03-16 (×3): qty 1

## 2011-03-16 MED ORDER — PANTOPRAZOLE SODIUM 40 MG PO TBEC: 40.0000 mg | DELAYED_RELEASE_TABLET | Freq: Every day | ORAL | Status: DC

## 2011-03-16 MED ORDER — INSULIN ASPART 100 UNIT/ML ~~LOC~~ SOLN
0.0000 [IU] | Freq: Three times a day (TID) | SUBCUTANEOUS | Status: DC
  Administered 2011-03-16: 7 [IU] via SUBCUTANEOUS
  Administered 2011-03-17 (×2): 4 [IU] via SUBCUTANEOUS
  Administered 2011-03-17: 20 [IU] via SUBCUTANEOUS
  Administered 2011-03-18: 11 [IU] via SUBCUTANEOUS
  Administered 2011-03-18 (×2): 20 [IU] via SUBCUTANEOUS
  Administered 2011-03-19: 4 [IU] via SUBCUTANEOUS
  Administered 2011-03-19: 3 [IU] via SUBCUTANEOUS
  Filled 2011-03-16: qty 3

## 2011-03-16 MED ORDER — INSULIN GLARGINE 100 UNIT/ML ~~LOC~~ SOLN
30.0000 [IU] | Freq: Every day | SUBCUTANEOUS | Status: DC
  Administered 2011-03-17: 30 [IU] via SUBCUTANEOUS
  Filled 2011-03-16: qty 3

## 2011-03-16 MED ORDER — INSULIN GLARGINE 100 UNIT/ML ~~LOC~~ SOLN: 20.0000 [IU] | Freq: Every day | SUBCUTANEOUS | Status: DC

## 2011-03-16 MED ORDER — HYDROXYZINE HCL 25 MG PO TABS
25.0000 mg | ORAL_TABLET | Freq: Three times a day (TID) | ORAL | Status: DC | PRN
  Administered 2011-03-16 (×2): 25 mg via ORAL
  Filled 2011-03-16 (×3): qty 1

## 2011-03-16 MED ORDER — POTASSIUM CHLORIDE IN NACL 20-0.9 MEQ/L-% IV SOLN
INTRAVENOUS | Status: DC
  Administered 2011-03-16: 15:00:00 via INTRAVENOUS
  Administered 2011-03-17: 100 mL via INTRAVENOUS
  Administered 2011-03-17: 75 mL via INTRAVENOUS
  Filled 2011-03-16 (×7): qty 1000

## 2011-03-16 MED ORDER — INSULIN ASPART 100 UNIT/ML ~~LOC~~ SOLN
4.0000 [IU] | Freq: Three times a day (TID) | SUBCUTANEOUS | Status: DC
  Administered 2011-03-16 – 2011-03-19 (×9): 4 [IU] via SUBCUTANEOUS
  Filled 2011-03-16: qty 3

## 2011-03-16 MED ORDER — INSULIN ASPART 100 UNIT/ML ~~LOC~~ SOLN
0.0000 [IU] | Freq: Every day | SUBCUTANEOUS | Status: DC
  Administered 2011-03-16: 4 [IU] via SUBCUTANEOUS
  Administered 2011-03-17 – 2011-03-18 (×2): 3 [IU] via SUBCUTANEOUS
  Filled 2011-03-16: qty 3

## 2011-03-16 MED ORDER — INSULIN GLARGINE 100 UNIT/ML ~~LOC~~ SOLN
10.0000 [IU] | Freq: Every day | SUBCUTANEOUS | Status: AC
  Administered 2011-03-16: 10 [IU] via SUBCUTANEOUS
  Filled 2011-03-16: qty 3

## 2011-03-16 NOTE — Progress Notes (Signed)
   CARE MANAGEMENT NOTE 03/16/2011  Patient:  SHAMAL, STRACENER   Account Number:  [id number]  Date Initiated:  03/16/2011  Documentation initiated by:  [name]  Subjective/Objective Assessment:   PT WAS ADMITTED WITH DKA AND METABOLIC ACIDOSIS     Action/Plan:   PROGRESSION OF CARE AND DISCHARGE PLANNING   Anticipated DC Date:  03/19/2011   Anticipated DC Plan:  HOME/SELF CARE  In-house referral  Clinical Social Worker      DC Planning Services  CM consult      Choice offered to / List presented to:             Status of service:  In process, will continue to follow Medicare Important Message given?   (If response is "NO", the following Medicare IM given date fields will be blank) Date Medicare IM given:   Date Additional Medicare IM given:    Discharge Disposition:    Per UR Regulation:  Reviewed for med. necessity/level of care/duration of stay  Comments:  UR COMPLETED.  [name], RN, [name] 1230 PT WAS ADMITTED WITH THE ABOVE DIAGNOSIS AND PLANS TO RETURN HOME AT DC.  WILL F/U ON DC NEEDS

## 2011-03-16 NOTE — Progress Notes (Signed)
MEDICATION RELATED CONSULT NOTE - INITIAL   Pharmacy Consult for Phenytoin Indication: h/o seizures  Allergies  Allergen Reactions  . Morphine And Related Itching    itching    Patient Measurements: Height: 5\' 11"  (180.3 cm) Weight: 211 lb 3.2 oz (95.8 kg) IBW/kg (Calculated) : 75.3 Ht: 70 inches Wt 95 kg  Vital Signs: Temp: 98.3 F (36.8 C) (02/06 0309) Temp src: Oral (02/06 0309) BP: 142/98 mmHg (02/06 0900) Pulse Rate: 117  (02/06 0900) Intake/Output from previous day: 02/05 0701 - 02/06 0700 In: 4035.4 [P.O.:470; I.V.:2899.4; IV Piggyback:666] Out: 1825 [Urine:1775; Emesis/NG output:50] Intake/Output from this shift: Total I/O In: 498.8 [P.O.:240; I.V.:258.8] Out: -   Labs:  Basename 03/16/11 0242 03/15/11 2212 03/15/11 1811 03/15/11 1437 03/15/11 0730 03/15/11 0003  WBC -- -- 11.4* 11.5* 12.8* --  HGB -- -- 15.3 15.5 16.6 --  HCT -- -- 41.9 42.4 46.6 --  PLT -- -- 206 200 203 --  APTT -- -- -- -- -- --  CREATININE 0.74 0.74 0.80 -- -- --  LABCREA -- -- -- -- -- --  CREATININE 0.74 0.74 0.80 -- -- --  CREAT24HRUR -- -- -- -- -- --  MG -- -- -- -- -- --  PHOS -- -- -- -- -- --  ALBUMIN -- -- -- -- -- 4.7  PROT -- -- -- -- -- 9.2*  ALBUMIN -- -- -- -- -- 4.7  AST -- -- -- -- -- 18  ALT -- -- -- -- -- 27  ALKPHOS -- -- -- -- -- 115  BILITOT -- -- -- -- -- 0.5  BILIDIR -- -- -- -- -- --  IBILI -- -- -- -- -- --   Estimated Creatinine Clearance: 131.9 ml/min (by C-G formula based on Cr of 0.74).   Microbiology: Recent Results (from the past 720 hour(s))  MRSA PCR SCREENING     Status: Normal   Collection Time   03/15/11  6:31 AM      Component Value Range Status Comment   MRSA by PCR NEGATIVE  NEGATIVE  Final     Medical History: Past Medical History  Diagnosis Date  . Diabetes mellitus   . Hypertension   . Seizures   . Pancreatitis   . Alcohol abuse   . Chronic abdominal pain     Medications:  Prescriptions prior to admission  Medication  Sig Dispense Refill  . acetaminophen (TYLENOL) 500 MG tablet Take 1,000 mg by mouth every 6 (six) hours as needed. For pain      . amylase-lipase-protease (PANGESTYME CN-20) 66.05-27-73 MU per capsule Take 1 capsule by mouth 3 (three) times daily with meals. Usually eats 2 large meals per day      . cephALEXin (KEFLEX) 500 MG capsule Take 500 mg by mouth 4 (four) times daily.        Marland Kitchen doxycycline (VIBRAMYCIN) 100 MG capsule Take 100 mg by mouth 2 (two) times daily.        Marland Kitchen gabapentin (NEURONTIN) 300 MG capsule Take 300 mg by mouth 3 (three) times daily.        . hydrochlorothiazide (HYDRODIURIL) 25 MG tablet Take 25 mg by mouth daily.      Marland Kitchen ibuprofen (ADVIL,MOTRIN) 200 MG tablet Take 200-400 mg by mouth every 6 (six) hours as needed. pain       . insulin aspart (NOVOLOG) 100 UNIT/ML injection Inject 14-18 Units into the skin 3 (three) times daily before meals. Based on sliding scale      .  Insulin Glargine (LANTUS [place]) Inject 40 Units into the skin at bedtime.       Marland Kitchen lisinopril (PRINIVIL,ZESTRIL) 10 MG tablet Take 10 mg by mouth daily.        Marland Kitchen omeprazole (PRILOSEC) 20 MG capsule Take 1 capsule (20 mg total) by mouth daily.  20 capsule  0  . oxyCODONE-acetaminophen (PERCOCET) 5-325 MG per tablet Take 1-2 tablets by mouth every 4 (four) hours as needed. pain       . pantoprazole (PROTONIX) 40 MG tablet Take 40 mg by mouth daily.        . phenytoin (DILANTIN) 100 MG ER capsule Take 200 mg by mouth at bedtime.          Assessment: 51 y.o. male presents with abd pain/N/V. Pt with h/o seizures - currently on 200mg  dilantin qHS. No seizure activity reported recently. Dilantin level on admit was <2.5 and he was boluses with 1250 mg.. Level this am therapeutic  @ 12.3  Goal of Therapy:  Dilantin level 10-20 mcg/ml  Plan:  1. Continue home dilantin dose 200 mg @hs .  Lanice Folden Poteet 03/16/2011,10:13 AM

## 2011-03-16 NOTE — Progress Notes (Deleted)
Inpatient Diabetes Program Recommendations  AACE/ADA: New Consensus Statement on Inpatient Glycemic Control (2009)  Target Ranges:  Prepandial:   less than 140 mg/dL      Peak postprandial:   less than 180 mg/dL (1-2 hours)      Critically ill patients:  140 - 180 mg/dL   Admitted in DKA.  CO2 on BMET this morning was 19.  Inpatient Diabetes Program Recommendations Insulin - Basal: Once patient ready to transition off IV insulin drip, please make sure he gets Lantus 1-2 hours before insulin drip d/c'd.  Home dose Lantus is 40 units QHS.  Note: Will follow. Ambrose Finland RN, MSN, CDE Diabetes Coordinator Inpatient Diabetes Program [phone]

## 2011-03-16 NOTE — Progress Notes (Addendum)
[name] [id number] Code Status: Full Admission Data: 03/16/2011 6:30 PM Attending Provider:  McClung, J.  WUJ:[id number],[id number], MD, MD Consults/ Treatment Team:    Gabriel Rush is a 51 y.o. male patient transferred from 3100.  awake, alert - oriented  X 3 - no acute distress noted.  VSS - Blood pressure 150/98, pulse 110, temperature 98.5 F (36.9 C), temperature source Oral, resp. rate 16, height 5\' 11"  (1.803 m), weight 95.8 kg (211 lb 3.2 oz), SpO2 99.00%.  no c/o shortness of breath, no c/o chest pain. Cardiac tele # [phone], in place, cardiac monitor yields:normal sinus rhythm. IV Fluids:  IV in place, occlusive dsg intact without redness, IV cath antecubital left, condition patent and no redness normal saline with 20K at 130ml/hr.   Allergies:   Allergies  Allergen Reactions  . Morphine And Related Itching    itching     Past Medical History  Diagnosis Date  . Diabetes mellitus   . Hypertension   . Seizures   . Pancreatitis   . Alcohol abuse   . Chronic abdominal pain    Medications Prior to Admission  Medication Dose Route Frequency Provider Last Rate Last Dose  . 0.9 %  sodium chloride infusion   Intravenous Continuous Eduard Clos, MD 250 mL/hr at 03/15/11 0900    . 0.9 % NaCl with KCl 20 mEq/ L  infusion   Intravenous Continuous Lonia Blood, MD 100 mL/hr at 03/16/11 1800    . dextrose 50 % solution 25 mL  25 mL Intravenous PRN Eduard Clos, MD      . folic acid (FOLVITE) tablet 1 mg  1 mg Oral Daily Eduard Clos, MD   1 mg at 03/16/11 1014  . HYDROmorphone (DILAUDID) 1 MG/ML injection           . HYDROmorphone (DILAUDID) injection 1 mg  1 mg Intravenous Once Nathan R. Pickering, MD   1 mg at 03/15/11 0145  . HYDROmorphone (DILAUDID) injection 1 mg  1 mg Intravenous Once Nathan R. Pickering, MD   1 mg at 03/15/11 0358  . HYDROmorphone (DILAUDID) injection 1 mg  1 mg Intravenous Q2H PRN Eduard Clos, MD   1 mg at 03/16/11 1726   . hydrOXYzine (ATARAX/VISTARIL) tablet 25 mg  25 mg Oral TID PRN Lonia Blood, MD   25 mg at 03/16/11 1459  . influenza  inactive virus vaccine (FLUZONE/FLUARIX) injection 0.5 mL  0.5 mL Intramuscular Tomorrow-1000 Saima Rizwan, MD   0.5 mL at 03/16/11 1028  . insulin aspart (novoLOG) injection 0-20 Units  0-20 Units Subcutaneous TID WC Lonia Blood, MD   7 Units at 03/16/11 1732  . insulin aspart (novoLOG) injection 0-5 Units  0-5 Units Subcutaneous QHS Lonia Blood, MD      . insulin aspart (novoLOG) injection 4 Units  4 Units Subcutaneous TID WC Lonia Blood, MD   4 Units at 03/16/11 1733  . insulin aspart (novoLOG) injection 8 Units  8 Units Subcutaneous Once Harrold Donath R. Rubin Payor, MD   8 Units at 03/15/11 0420  . insulin glargine (LANTUS) injection 10 Units  10 Units Subcutaneous QHS Lonia Blood, MD      . insulin glargine (LANTUS) injection 20 Units  20 Units Subcutaneous Once Lonia Blood, MD   20 Units at 03/16/11 1458  . insulin glargine (LANTUS) injection 30 Units  30 Units Subcutaneous QHS Lonia Blood, MD      . insulin regular (  NOVOLIN R,HUMULIN R) 1 Units/mL in sodium chloride 0.9 % 100 mL infusion   Intravenous Continuous Lonia Blood, MD   1.6 Units/hr at 03/16/11 1458  . labetalol (NORMODYNE,TRANDATE) injection 10 mg  10 mg Intravenous Q2H PRN Eduard Clos, MD   10 mg at 03/15/11 [id number]  . LORazepam (ATIVAN) tablet 1 mg  1 mg Oral Q6H PRN Eduard Clos, MD       Or  . LORazepam (ATIVAN) injection 1 mg  1 mg Intravenous Q6H PRN Eduard Clos, MD   1 mg at 03/16/11 1022  . mulitivitamin with minerals tablet 1 tablet  1 tablet Oral Daily Eduard Clos, MD   1 tablet at 03/16/11 1014  . ondansetron (ZOFRAN) injection 4 mg  4 mg Intravenous Once [employer] Pickering, MD   4 mg at 03/15/11 0145  . ondansetron (ZOFRAN) injection 4 mg  4 mg Intravenous Once [employer]. Pickering, MD   4 mg at 03/15/11 0437  . ondansetron  (ZOFRAN) injection 4 mg  4 mg Intravenous Q6H PRN Eduard Clos, MD   4 mg at 03/16/11 0424  . pantoprazole (PROTONIX) EC tablet 40 mg  40 mg Oral Q1200 Lonia Blood, MD      . phenytoin (DILANTIN) 1,250 mg in sodium chloride 0.9 % 250 mL IVPB  1,250 mg Intravenous Once Calvert Cantor, MD   1,250 mg at 03/15/11 1100  . phenytoin (DILANTIN) ER capsule 200 mg  200 mg Oral QHS Hilario Quarry Amend, PHARMD   200 mg at 03/15/11 2151  . pneumococcal 23 valent vaccine (PNU-IMMUNE) injection 0.5 mL  0.5 mL Intramuscular Tomorrow-1000 Calvert Cantor, MD   0.5 mL at 03/16/11 1025  . potassium chloride 10 mEq in 100 mL IVPB  10 mEq Intravenous Q1 Hr x 2 Calvert Cantor, MD   10 mEq at 03/15/11 1745  . potassium chloride 10 MEQ/100ML IVPB           . sodium chloride 0.9 % bolus 1,000 mL  1,000 mL Intravenous Once Harrold Donath R. Pickering, MD   1,000 mL at 03/15/11 0146  . sodium chloride 0.9 % bolus 1,000 mL  1,000 mL Intravenous Once Harrold Donath R. Pickering, MD   1,000 mL at 03/15/11 0358  . thiamine (VITAMIN B-1) tablet 100 mg  100 mg Oral Daily Eduard Clos, MD   100 mg at 03/16/11 1014  . DISCONTD: 0.9 %  sodium chloride infusion   Intravenous Continuous Eduard Clos, MD      . DISCONTD: dextrose 5 %-0.45 % sodium chloride infusion   Intravenous Continuous Eduard Clos, MD 125 mL/hr at 03/15/11 1200    . DISCONTD: dextrose 5 %-0.45 % sodium chloride infusion   Intravenous Continuous Calvert Cantor, MD 125 mL/hr at 03/16/11 1300    . DISCONTD: insulin glargine (LANTUS) injection 20 Units  20 Units Subcutaneous QHS Lonia Blood, MD      . DISCONTD: pantoprazole (PROTONIX) EC tablet 40 mg  40 mg Oral Q1200 Lonia Blood, MD      . DISCONTD: pantoprazole (PROTONIX) injection 40 mg  40 mg Intravenous Q12H Eduard Clos, MD   40 mg at 03/16/11 1014  . DISCONTD: potassium chloride 10 mEq in 100 mL IVPB  10 mEq Intravenous Q1H Eduard Clos, MD   10 mEq at 03/15/11 0817  .  DISCONTD: thiamine (B-1) injection 100 mg  100 mg Intravenous Daily Eduard Clos, MD  Medications Prior to Admission  Medication Sig Dispense Refill  . acetaminophen (TYLENOL) 500 MG tablet Take 1,000 mg by mouth every 6 (six) hours as needed. For pain      . amylase-lipase-protease (PANGESTYME CN-20) 66.05-27-73 MU per capsule Take 1 capsule by mouth 3 (three) times daily with meals. Usually eats 2 large meals per day      . cephALEXin (KEFLEX) 500 MG capsule Take 500 mg by mouth 4 (four) times daily.        Marland Kitchen doxycycline (VIBRAMYCIN) 100 MG capsule Take 100 mg by mouth 2 (two) times daily.        Marland Kitchen gabapentin (NEURONTIN) 300 MG capsule Take 300 mg by mouth 3 (three) times daily.        . hydrochlorothiazide (HYDRODIURIL) 25 MG tablet Take 25 mg by mouth daily.      Marland Kitchen ibuprofen (ADVIL,MOTRIN) 200 MG tablet Take 200-400 mg by mouth every 6 (six) hours as needed. pain       . insulin aspart (NOVOLOG) 100 UNIT/ML injection Inject 14-18 Units into the skin 3 (three) times daily before meals. Based on sliding scale      . Insulin Glargine (LANTUS [place]) Inject 40 Units into the skin at bedtime.       Marland Kitchen lisinopril (PRINIVIL,ZESTRIL) 10 MG tablet Take 10 mg by mouth daily.        Marland Kitchen omeprazole (PRILOSEC) 20 MG capsule Take 1 capsule (20 mg total) by mouth daily.  20 capsule  0  . oxyCODONE-acetaminophen (PERCOCET) 5-325 MG per tablet Take 1-2 tablets by mouth every 4 (four) hours as needed. pain       . pantoprazole (PROTONIX) 40 MG tablet Take 40 mg by mouth daily.        . phenytoin (DILANTIN) 100 MG ER capsule Take 200 mg by mouth at bedtime.         History:  obtained from the patient. Tobacco/alcohol: Does not smoke.   Orientation to room, and floor completed with information packet given to patient/family.  Patient viewed safety video at this time.  Admission INP armband ID verified with patient/family, and in place.   SR up x 2, fall assessment complete, with patient and family able  to verbalize understanding of risk associated with falls, and verbalized understanding to call nsg before up out of bed.  Call light within reach, patient able to voice, and demonstrate understanding.  Skin, clean-dry- intact without evidence of bruising, or skin tears.   No evidence of skin break down noted on exam.     Will cont to eval and treat per MD orders.  Debbora Presto, [place] 03/16/2011 6:30 PM

## 2011-03-16 NOTE — Progress Notes (Addendum)
[facility] [place] TEAM 8  Subjective: 51 year-old male with history of chronic pancreatitis from alcoholism, diabetes mellitus on insulin, seizures from alcohol withdrawals on Dilantin presented to the ER because of persistent abdominal pain over the last 3 days.  In the ER patient was found to be having severe metabolic acidosis with high blood sugar.  Today the pt c/o ongoing abdom pain.  He denies sob, ha, or chest pain.  He does report signif nausea, but not vomiting thus far today.   Objective: Weight change: 2.8 kg (6 lb 2.8 oz)  Intake/Output Summary (Last 24 hours) at 03/16/11 1302 Last data filed at 03/16/11 1225  Gross per 24 hour  Intake 3404.3 ml  Output   1075 ml  Net 2329.3 ml   Blood pressure 136/102, pulse 127, temperature 98.5 F (36.9 C), temperature source Oral, resp. rate 19, height 5\' 11"  (1.803 m), weight 95.8 kg (211 lb 3.2 oz), SpO2 99.00%.  Physical Exam: General: No acute respiratory distress Lungs: Clear to auscultation bilaterally without wheezes or crackles Cardiovascular: Regular rhythm but tachycardic without murmur gallop or rub normal S1 and S2 Abdomen: tender to palpation in epigastrium - BS + but hypo - no rebound - no appreciable mass Extremities: No significant cyanosis, clubbing, or edema bilateral lower extremities  Lab Results:  [facility] 03/16/11 0242 03/15/11 2212 03/15/11 1811  NA 134* 136 136  K 3.4* 3.9 4.0  CL 102 105 105  CO2 19 17* 15*  GLUCOSE 140* 140* 120*  BUN 10 12 14   CREATININE 0.74 0.74 0.80  CALCIUM 9.3 9.3 9.5  MG -- -- --  PHOS -- -- --    Basename 03/15/11 0003  AST 18  ALT 27  ALKPHOS 115  BILITOT 0.5  PROT 9.2*  ALBUMIN 4.7    Basename 03/15/11 1811 03/15/11 1437 03/15/11 0730 03/15/11 0003  WBC 11.4* 11.5* 12.8* --  NEUTROABS -- -- -- 7.4  HGB 15.3 15.5 16.6 --  HCT 41.9 42.4 46.6 --  MCV 82.2 82.5 84.9 --  PLT 206 200 203 --    Basename 03/15/11 2213 03/15/11 1430 03/15/11 0730    CKTOTAL 124 122 135  CKMB 3.0 2.9 3.0  CKMBINDEX -- -- --  TROPONINI <0.30 <0.30 <0.30   Micro Results: Recent Results (from the past 240 hour(s))  MRSA PCR SCREENING     Status: Normal   Collection Time   03/15/11  6:31 AM      Component Value Range Status Comment   MRSA by PCR NEGATIVE  NEGATIVE  Final     Studies/Results: All recent x-ray/radiology reports have been reviewed in detail.   Medications: I have reviewed the patient's complete medication list.  Assessment/Plan:  DKA Due to insulin noncompliance - bicarb has normalized, but did so very slowly - gap is essentially normal (13) - will transition off insulin gtt in usual fashion - follow BMETs Q12 to assure does not lapse back into DKA  Severely uncontrolled DM2 Due to noncompliance - will transition to scheduled insulin and follow CBGs to help determine home regimen  abdom pain Gastritis vs/ PUD vs/ pancreatitis vs/DKA - at this point it appears the patient's pain is primarily due to his pancreatitis-I have counseled him as to the direct connection between his alcohol abuse and his pain-until his pain is improved we cannot advance his diet-we will need to continue to watch for clinical signs of complications that would indicate a need for repeat imaging  ?UGIB/hematemesis resolved  EtOHism Is a binge drinker-I have counseled the patient on the need for absolute alcohol abstinence-we will request substance abuse counseling to advise the patient of the community resources to assist him in this goal  Hyperkalemia Due to DKA - resolved w/ intracellular shift  Chronic pancreatitis (due to EtOH) See discussion above  Seizures (felt to be EtOH related) On home anti-seizure medication  Lonia Blood, MD [facility] Office  [phone] Pager [phone]  On-Call/Text Page:      [email or web address]      password [facility]

## 2011-03-17 ENCOUNTER — Inpatient Hospital Stay (HOSPITAL_COMMUNITY): Payer: Self-pay

## 2011-03-17 LAB — BASIC METABOLIC PANEL
BUN: 6 mg/dL (ref 6–23)
CO2: 22 mEq/L (ref 19–32)
Chloride: 96 mEq/L (ref 96–112)
GFR calc non Af Amer: >90 mL/min (ref >90)
Glucose, Bld: 362 mg/dL — ABNORMAL HIGH (ref 70–99)
Potassium: 3.5 mEq/L (ref 3.5–5.1)

## 2011-03-17 LAB — GLUCOSE, CAPILLARY
Glucose-Capillary: 351 mg/dL — ABNORMAL HIGH (ref 70–99)
Glucose-Capillary: 159 mg/dL — ABNORMAL HIGH (ref 70–99)

## 2011-03-17 LAB — CBC
HCT: 35.8 % — ABNORMAL LOW (ref 39.0–52.0)
Hemoglobin: 12.7 g/dL — ABNORMAL LOW (ref 13.0–17.0)
MCH: 28.9 pg (ref 26.0–34.0)
MCV: 81.5 fL (ref 78.0–100.0)
RBC: 4.39 MIL/uL (ref 4.22–5.81)

## 2011-03-17 MED ORDER — PANCRELIPASE (LIP-PROT-AMYL) 12000-38000 UNITS PO CPEP
1.0000 | ORAL_CAPSULE | Freq: Three times a day (TID) | ORAL | Status: DC
  Administered 2011-03-17 – 2011-03-19 (×6): 1 via ORAL
  Filled 2011-03-17 (×9): qty 1

## 2011-03-17 MED ORDER — DOCUSATE SODIUM 100 MG PO CAPS
100.0000 mg | ORAL_CAPSULE | Freq: Every day | ORAL | Status: DC
  Administered 2011-03-17 – 2011-03-18 (×2): 100 mg via ORAL
  Filled 2011-03-17 (×3): qty 1

## 2011-03-17 MED ORDER — GABAPENTIN 300 MG PO CAPS
300.0000 mg | ORAL_CAPSULE | Freq: Three times a day (TID) | ORAL | Status: DC
  Administered 2011-03-17 – 2011-03-19 (×7): 300 mg via ORAL
  Filled 2011-03-17 (×8): qty 1

## 2011-03-17 MED ORDER — POTASSIUM CHLORIDE CRYS ER 20 MEQ PO TBCR
20.0000 meq | EXTENDED_RELEASE_TABLET | Freq: Two times a day (BID) | ORAL | Status: DC
  Administered 2011-03-17: 20 meq via ORAL
  Filled 2011-03-17 (×3): qty 1

## 2011-03-17 MED ORDER — OXYCODONE-ACETAMINOPHEN 5-325 MG PO TABS
1.0000 | ORAL_TABLET | ORAL | Status: DC | PRN
  Administered 2011-03-17 – 2011-03-19 (×11): 2 via ORAL
  Filled 2011-03-17 (×11): qty 2

## 2011-03-17 NOTE — Progress Notes (Signed)
Patient ID: Gabriel Rush, male   DOB: 05-Sep-1960, 51 y.o.   MRN: [id number]  51 year-old male with history of chronic pancreatitis from alcoholism, diabetes mellitus on insulin, seizures from alcohol withdrawals on Dilantin presented to the ER because of persistent abdominal pain over the last 3 days.  In the ER patient was found to be having severe metabolic acidosis with high blood sugar.  Subjective: Complaining of abdominal pain and distention.  Able to tolerate clears.  No BM since admit.  Objective: Weight change:   Intake/Output Summary (Last 24 hours) at 03/17/11 1429 Last data filed at 03/17/11 0900  Gross per 24 hour  Intake 2101.05 ml  Output      0 ml  Net 2101.05 ml   Blood pressure 142/83, pulse 108, temperature 98.1 F (36.7 C), temperature source Oral, resp. rate 20, height 5\' 11"  (1.803 m), weight 95.8 kg (211 lb 3.2 oz), SpO2 98.00%.  Physical Exam: General: No acute respiratory distress Lungs: Clear to auscultation bilaterally without wheezes or crackles Cardiovascular: Regular rhythm but tachycardic without murmur gallop or rub normal S1 and S2 Abdomen: distended, tender to palpation in epigastrium - BS + but hypo - no rebound - no appreciable mass Extremities: No significant cyanosis, clubbing, or edema bilateral lower extremities  Lab Results:  Basename 03/17/11 0620 03/16/11 1721 03/16/11 0242  NA 130* 130* 134*  K 3.5 3.1* 3.4*  CL 96 97 102  CO2 22 21 19   GLUCOSE 362* 234* 140*  BUN 6 7 10   CREATININE 0.73 0.69 0.74  CALCIUM 8.7 8.7 9.3  MG -- -- --  PHOS -- -- --    Basename 03/15/11 0003  AST 18  ALT 27  ALKPHOS 115  BILITOT 0.5  PROT 9.2*  ALBUMIN 4.7    Basename 03/17/11 0620 03/15/11 1811 03/15/11 1437 03/15/11 0003  WBC 6.0 11.4* 11.5* --  NEUTROABS -- -- -- 7.4  HGB 12.7* 15.3 15.5 --  HCT 35.8* 41.9 42.4 --  MCV 81.5 82.2 82.5 --  PLT 120* 206 200 --    Basename 03/15/11 2213 03/15/11 1430 03/15/11 0730  CKTOTAL 124 122  135  CKMB 3.0 2.9 3.0  CKMBINDEX -- -- --  TROPONINI <0.30 <0.30 <0.30   Micro Results: Recent Results (from the past 240 hour(s))  MRSA PCR SCREENING     Status: Normal   Collection Time   03/15/11  6:31 AM      Component Value Range Status Comment   MRSA by PCR NEGATIVE  NEGATIVE  Final      Assessment/Plan:  DKA Due to insulin noncompliance - bicarb has normalized, but did so very slowly - gap is essentially normal (12) - follow BMETs.  Severely uncontrolled DM2 Due to noncompliance - being transitioned to SSI - Resistant, and Lantus 30 unit qhs.  Will monitor closely.  Will request out patient diabetic education.  abdom pain secondary to pancreatitis Patient requiring 1 mg dilaudid q 2.  Will add percocet (as he was on at home) for moderate pain and add back gabapentin (if he can keep down POs).  Patient could be developing Ileus - will check 2 view abdominal xray.  (no BM since admit)  Pancreatitis - Acute on Chronic - will trial small portions of low fat diet and add back pancrease with meals.  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  Will re-eval in the am.  Romualdo Bolk [facility] [phone]

## 2011-03-17 NOTE — Progress Notes (Signed)
Patient seen and examined by me.  Agree with plan to titrate patients lantus and SSI for better control. Gap of 13.  Will add meds to help bowels move.  Hope for D/C soon.

## 2011-03-17 NOTE — Progress Notes (Signed)
   CARE MANAGEMENT NOTE 03/17/2011  Patient:  Gabriel Rush, Gabriel Rush   Account Number:  [id number]  Date Initiated:  03/16/2011  Documentation initiated by:  Gabriel Rush  Subjective/Objective Assessment:   PT WAS ADMITTED WITH DKA AND METABOLIC ACIDOSIS     Action/Plan:   PROGRESSION OF CARE AND DISCHARGE PLANNING   Anticipated DC Date:  03/19/2011   Anticipated DC Plan:  HOME/SELF CARE  In-house referral  Clinical Social Worker      DC Planning Services  CM consult      Choice offered to / List presented to:             Status of service:  In process, will continue to follow Medicare Important Message given?   (If response is "NO", the following Medicare IM given date fields will be blank) Date Medicare IM given:   Date Additional Medicare IM given:    Discharge Disposition:    Per UR Regulation:  Reviewed for med. necessity/level of care/duration of stay  Comments:  PCP- [facility]- Dr. Philipp Deputy (next appointment 04/01/11- 1000)  03/17/11- 1040- Gabriel Pierini RN, [name] [phone] Spoke with pt at bedside- per conversation pt states that he lives with his mom at home- Gabriel Rush [phone]). Pt reports that his PCP is Dr. Philipp Deputy at [facility] and his next appointment is Feb. 22 at 10:00. He states that he has an active orange card and is able to get his medications at [facility]. CM to follow  UR COMPLETED.  Gabriel Boer, RN, [name] 1230 PT WAS ADMITTED WITH THE ABOVE DIAGNOSIS AND PLANS TO RETURN HOME AT DC.  WILL F/U ON DC NEEDS

## 2011-03-18 LAB — CBC
HCT: 34.3 % — ABNORMAL LOW (ref 39.0–52.0)
MCV: 81.3 fL (ref 78.0–100.0)
Platelets: 115 10*3/uL — ABNORMAL LOW (ref 150–400)
RBC: 4.22 MIL/uL (ref 4.22–5.81)
WBC: 3.8 10*3/uL — ABNORMAL LOW (ref 4.0–10.5)

## 2011-03-18 LAB — GLUCOSE, CAPILLARY: Glucose-Capillary: 263 mg/dL — ABNORMAL HIGH (ref 70–99)

## 2011-03-18 LAB — BASIC METABOLIC PANEL
CO2: 25 mEq/L (ref 19–32)
Chloride: 100 mEq/L (ref 96–112)
Sodium: 135 mEq/L (ref 135–145)

## 2011-03-18 MED ORDER — FOLIC ACID 1 MG PO TABS: 1.0000 mg | ORAL_TABLET | Freq: Every day | ORAL | Status: DC

## 2011-03-18 MED ORDER — ADULT MULTIVITAMIN W/MINERALS CH: 1.0000 | ORAL_TABLET | Freq: Every day | ORAL | Status: DC

## 2011-03-18 MED ORDER — POTASSIUM CHLORIDE CRYS ER 20 MEQ PO TBCR
40.0000 meq | EXTENDED_RELEASE_TABLET | Freq: Two times a day (BID) | ORAL | Status: AC
  Administered 2011-03-18 (×2): 40 meq via ORAL
  Filled 2011-03-18: qty 2

## 2011-03-18 MED ORDER — THIAMINE HCL 100 MG PO TABS: 100.0000 mg | ORAL_TABLET | Freq: Every day | ORAL | Status: DC

## 2011-03-18 MED ORDER — INSULIN GLARGINE 100 UNIT/ML ~~LOC~~ SOLN: 34.0000 [IU] | Freq: Every day | SUBCUTANEOUS | Status: DC

## 2011-03-18 MED ORDER — METOPROLOL SUCCINATE 12.5 MG HALF TABLET
12.5000 mg | ORAL_TABLET | Freq: Every day | ORAL | Status: DC
  Filled 2011-03-18 (×3): qty 1

## 2011-03-18 MED ORDER — POLYETHYLENE GLYCOL 3350 17 G PO PACK
17.0000 g | PACK | Freq: Every day | ORAL | Status: DC | PRN
  Administered 2011-03-18: 17 g via ORAL
  Filled 2011-03-18: qty 1

## 2011-03-18 MED ORDER — AMLODIPINE BESYLATE 5 MG PO TABS: 5.0000 mg | ORAL_TABLET | Freq: Every day | ORAL | Status: DC

## 2011-03-18 MED ORDER — AMLODIPINE BESYLATE 5 MG PO TABS
5.0000 mg | ORAL_TABLET | Freq: Every day | ORAL | Status: DC
  Administered 2011-03-18 – 2011-03-19 (×2): 5 mg via ORAL
  Filled 2011-03-18 (×2): qty 1

## 2011-03-18 MED ORDER — INSULIN GLARGINE 100 UNIT/ML ~~LOC~~ SOLN
40.0000 [IU] | Freq: Every day | SUBCUTANEOUS | Status: DC
  Administered 2011-03-18: 40 [IU] via SUBCUTANEOUS

## 2011-03-18 NOTE — Progress Notes (Signed)
Patient ID: Gabriel Rush, male   DOB: 12/14/1960, 51 y.o.   MRN: [id number]  51 year-old male with history of chronic pancreatitis from alcoholism, diabetes mellitus on insulin, seizures from alcohol withdrawals on Dilantin presented to the ER because of persistent abdominal pain over the last 3 days.  In the ER patient was found to be having severe metabolic acidosis with high blood sugar.  Subjective: Tolerating solid diet.  On Oral Pain Meds now, still No BM since admit.  Patient concerned about his blood pressures.  Objective: Weight change:   Intake/Output Summary (Last 24 hours) at 03/18/11 1031 Last data filed at 03/18/11 0900  Gross per 24 hour  Intake   1400 ml  Output      0 ml  Net   1400 ml   Blood pressure 175/102, pulse 108, temperature 97.4 F (36.3 C), temperature source Oral, resp. rate 18, height 5\' 11"  (1.803 m), weight 95.8 kg (211 lb 3.2 oz), SpO2 97.00%.  Physical Exam: essentially unchanged from 2/7 General: No acute respiratory distress Lungs: Clear to auscultation bilaterally without wheezes or crackles Cardiovascular: Regular rhythm but tachycardic without murmur gallop or rub normal S1 and S2 Abdomen: distended, tender to palpation in epigastrium - BS + but hypo - no rebound - no appreciable mass Extremities: No significant cyanosis, clubbing, or edema bilateral lower extremities  Lab Results:  Basename 03/18/11 0512 03/17/11 0620 03/16/11 1721  NA 135 130* 130*  K 3.2* 3.5 3.1*  CL 100 96 97  CO2 25 22 21   GLUCOSE 352* 362* 234*  BUN 5* 6 7  CREATININE 0.61 0.73 0.69  CALCIUM 8.8 8.7 8.7  MG -- -- --  PHOS -- -- --   No results found for this basename: AST:2,ALT:2,ALKPHOS:2,BILITOT:2,PROT:2,ALBUMIN:2 in the last 72 hours  Basename 03/18/11 0512 03/17/11 0620 03/15/11 1811  WBC 3.8* 6.0 11.4*  NEUTROABS -- -- --  HGB 12.4* 12.7* 15.3  HCT 34.3* 35.8* 41.9  MCV 81.3 81.5 82.2  PLT 115* 120* 206    Basename 03/15/11 2213 03/15/11 1430    CKTOTAL 124 122  CKMB 3.0 2.9  CKMBINDEX -- --  TROPONINI <0.30 <0.30   Micro Results: Recent Results (from the past 240 hour(s))  MRSA PCR SCREENING     Status: Normal   Collection Time   03/15/11  6:31 AM      Component Value Range Status Comment   MRSA by PCR NEGATIVE  NEGATIVE  Final      Assessment/Plan:  Hypertension Patient on Lisinopril & HCTZ at home.  These were stopped due to pancreatitis.  Bps 175/102 this am. Patient received PRN IV Labetolol.  Will start low dose Toprol 12.5 and discuss with attending.  Will likely need to be adjusted before D/C.  Severely uncontrolled DM2 Due to noncompliance - being transitioned to SSI - Resistant, and Lantus.  Will increase lantus to 34 units qhs as of 2/8.  Will monitor closely.  Will request out patient diabetic education.  abdom pain secondary to pancreatitis Taking 2 percocets q 4 - 6 hours for pain.  Off of IV dilaudid.    Abdomen still distended no bm yet.  Potassium low again.  Will replete and add miralax.   Pancreatitis - Acute on Chronic - eating small portions of low fat diet and add back pancrease with meals.  Do not want to restart HCTZ or Lisinopril secondary to pancreatitis.  ?UGIB/hematemesis Resolved.  Hgb stable at 12.7  EtOHism Is a binge drinker-I have  counseled the patient on the need for absolute alcohol abstinence-we will request substance abuse counseling to advise the patient of the community resources to assist him in this goal  DKA - resolved. Due to insulin noncompliance - bicarb has normalized, but did so very slowly - gap is essentially normal (12) - follow BMETs.  Hyperkalemia - resolved.  Now Hypokalemic.  Will replete. Due to DKA - resolved w/ intracellular shift  Seizures (felt to be EtOH related) On home anti-seizure medication  Disposition -  Patient's CBGs and BP still elevated.  Making adjustments.  Will replete potassium.  Home late today or in am on 2/9.   Algis Downs,  [place] [facility] [phone]

## 2011-03-18 NOTE — Progress Notes (Signed)
03/18/11 1005  BP 175/102 informed Mary Ann,PA and pt. moderate fall risk-refusing bed alarm;pt. steady on feet. Leandrew Koyanagi Gaynor Genco,RN

## 2011-03-18 NOTE — Progress Notes (Signed)
Patient seen and examined by me.  Agree with plan to adjust BP meds and diabetic meds.  Will encourage ambulation and give miralx for BM.  Hope for D/C in AM.

## 2011-03-19 DIAGNOSIS — E876 Hypokalemia: Secondary | ICD-10-CM | POA: Diagnosis not present

## 2011-03-19 DIAGNOSIS — E875 Hyperkalemia: Secondary | ICD-10-CM | POA: Diagnosis present

## 2011-03-19 LAB — BASIC METABOLIC PANEL
BUN: 5 mg/dL — ABNORMAL LOW (ref 6–23)
Calcium: 9 mg/dL (ref 8.4–10.5)
GFR calc Af Amer: >90 mL/min (ref >90)
GFR calc non Af Amer: >90 mL/min (ref >90)
Potassium: 2.8 mEq/L — ABNORMAL LOW (ref 3.5–5.1)
Sodium: 136 mEq/L (ref 135–145)

## 2011-03-19 LAB — GLUCOSE, CAPILLARY
Glucose-Capillary: 135 mg/dL — ABNORMAL HIGH (ref 70–99)
Glucose-Capillary: 174 mg/dL — ABNORMAL HIGH (ref 70–99)

## 2011-03-19 MED ORDER — POTASSIUM CHLORIDE 10 MEQ/100ML IV SOLN
10.0000 meq | INTRAVENOUS | Status: AC
  Administered 2011-03-19 (×2): 10 meq via INTRAVENOUS
  Filled 2011-03-19 (×2): qty 100

## 2011-03-19 MED ORDER — POTASSIUM CHLORIDE CRYS ER 20 MEQ PO TBCR
40.0000 meq | EXTENDED_RELEASE_TABLET | Freq: Once | ORAL | Status: AC
  Administered 2011-03-19: 40 meq via ORAL
  Filled 2011-03-19: qty 2

## 2011-03-19 NOTE — Discharge Summary (Addendum)
Discharge Summary  Gabriel Rush MR#: [id number]  DOB:April 19, 1960  Date of Admission: 03/14/2011 Date of Discharge: 03/19/2011  Patient's PCP: Julieanne Manson, MD, MD  Attending Physician:VANN, JESSICA  Discharge Diagnoses: Principal Problem:  *DKA, type 2 Active Problems:  ALCOHOL ABUSE  SEIZURE DISORDER  Abdominal pain  Hematemesis  Chronic pancreatitis  Hypokalemia   Brief Admitting History and Physical 51 year-old male with history of chronic pancreatitis from alcoholism, diabetes mellitus on insulin, seizures from alcohol withdrawals on Dilantin presented to the ER because of persistent abdominal pain over the last 3 days. 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 Since then he's been having some epigastric pain which became more diffuse severe in intensity and frequency associated with nausea and vomiting eventually started having dark vomitus. In the ER patient was found to be having severe metabolic acidosis with high blood sugar. Patient's acute abdominal series not sure anything concerning. Patient has been admitted for DKA with abdominal pain probably from chronic pancreatitis exacerbation. Patient denies any chest pain short breath dizziness or loss of consciousness or focal deficits.    Discharge Medications Medication List  As of 03/19/2011  8:45 AM   STOP taking these medications         cephALEXin 500 MG capsule      doxycycline 100 MG capsule      hydrochlorothiazide 25 MG tablet      ibuprofen 200 MG tablet      lisinopril 10 MG tablet         TAKE these medications         acetaminophen 500 MG tablet   Commonly known as: TYLENOL   Take 1,000 mg by mouth every 6 (six) hours as needed. For pain      amLODipine 5 MG tablet   Commonly known as: NORVASC   Take 1 tablet (5 mg total) by mouth daily.      amylase-lipase-protease 66.05-27-73 MU per capsule   Commonly known as: PANGESTYME CN-20   Take 1 capsule by mouth 3 (three) times  daily with meals. Usually eats 2 large meals per day      folic acid 1 MG tablet   Commonly known as: FOLVITE   Take 1 tablet (1 mg total) by mouth daily.      gabapentin 300 MG capsule   Commonly known as: NEURONTIN   Take 300 mg by mouth 3 (three) times daily.      insulin aspart 100 UNIT/ML injection   Commonly known as: novoLOG   Inject 14-18 Units into the skin 3 (three) times daily before meals. Based on sliding scale      LANTUS [place]   Inject 40 Units into the skin at bedtime.      mulitivitamin with minerals Tabs   Take 1 tablet by mouth daily.      omeprazole 20 MG capsule   Commonly known as: PRILOSEC   Take 1 capsule (20 mg total) by mouth daily.      oxyCODONE-acetaminophen 5-325 MG per tablet   Commonly known as: PERCOCET   Take 1-2 tablets by mouth every 4 (four) hours as needed. pain      pantoprazole 40 MG tablet   Commonly known as: PROTONIX   Take 40 mg by mouth daily.      phenytoin 100 MG ER capsule   Commonly known as: DILANTIN   Take 200 mg by mouth at bedtime.      thiamine 100 MG tablet  Take 1 tablet (100 mg total) by mouth daily.            Hospital Course:  1. Hypertension  Patient on Lisinopril & HCTZ at home. These were stopped due to pancreatitis.  Started norvasc and will titrate up as outpt  2. Severely uncontrolled DM2  Due to noncompliance - being transitioned to SSI - Resistant, and Lantus. Will increase lantus to 40units qhs as of 2/8. Will monitor closely. Will request out patient diabetic education. 3. abdom pain secondary to pancreatitis  Taking 2 percocets q 4 - 6 hours for pain. Off of IV dilaudid. +BM. Potassium low again. Will replete and add miralax.  4. Pancreatitis - Acute on Chronic - eating small portions of low fat diet and add back pancrease with meals. Do not want to restart HCTZ or Lisinopril secondary to pancreatitis.  5. ?UGIB/hematemesis  Resolved. Hgb stable at 12.7  6. EtOHism  Is a binge drinker-I have  counseled the patient on the need for absolute alcohol abstinence-we will request substance abuse counseling to advise the patient of the community resources to assist him in this goal  7. DKA - resolved.  Due to insulin noncompliance - bicarb has normalized, but did so very slowly 8. Hyperkalemia - resolved. Now Hypokalemic. Will replete.  Due to DKA - resolved w/ intracellular shift  9. Seizures (felt to be EtOH related)  On home anti-seizure medication    Day of Discharge BP 147/88  Pulse 71  Temp(Src) 97.9 F (36.6 C) (Oral)  Resp 16  Ht 5\' 11"  (1.803 m)  Wt 95.8 kg (211 lb 3.2 oz)  BMI 29.46 kg/m2  SpO2 98%  Results for orders placed during the hospital encounter of 03/14/11 (from the past 48 hour(s))  GLUCOSE, CAPILLARY     Status: Abnormal   Collection Time   03/17/11 11:46 AM      Component Value Range Comment   Glucose-Capillary 159 (*) 70 - 99 (mg/dL)   GLUCOSE, CAPILLARY     Status: Abnormal   Collection Time   03/17/11  5:22 PM      Component Value Range Comment   Glucose-Capillary 159 (*) 70 - 99 (mg/dL)   GLUCOSE, CAPILLARY     Status: Abnormal   Collection Time   03/17/11  8:52 PM      Component Value Range Comment   Glucose-Capillary 271 (*) 70 - 99 (mg/dL)    Comment 1 Notify RN     BASIC METABOLIC PANEL     Status: Abnormal   Collection Time   03/18/11  5:12 AM      Component Value Range Comment   Sodium 135  135 - 145 (mEq/L)    Potassium 3.2 (*) 3.5 - 5.1 (mEq/L)    Chloride 100  96 - 112 (mEq/L)    CO2 25  19 - 32 (mEq/L)    Glucose, Bld 352 (*) 70 - 99 (mg/dL)    BUN 5 (*) 6 - 23 (mg/dL)    Creatinine, Ser 1.61  0.50 - 1.35 (mg/dL)    Calcium 8.8  8.4 - 10.5 (mg/dL)    GFR calc non Af Amer >90  >90 (mL/min)    GFR calc Af Amer >90  >90 (mL/min)   CBC     Status: Abnormal   Collection Time   03/18/11  5:12 AM      Component Value Range Comment   WBC 3.8 (*) 4.0 - 10.5 (K/uL)    RBC 4.22  4.22 -  5.81 (MIL/uL)    Hemoglobin 12.4 (*) 13.0 - 17.0  (g/dL)    HCT 16.1 (*) 09.6 - 52.0 (%)    MCV 81.3  78.0 - 100.0 (fL)    MCH 29.4  26.0 - 34.0 (pg)    MCHC 36.2 (*) 30.0 - 36.0 (g/dL)    RDW 04.5  40.9 - 81.1 (%)    Platelets 115 (*) 150 - 400 (K/uL) PLATELET COUNT CONFIRMED BY SMEAR  GLUCOSE, CAPILLARY     Status: Abnormal   Collection Time   03/18/11  7:03 AM      Component Value Range Comment   Glucose-Capillary 263 (*) 70 - 99 (mg/dL)   GLUCOSE, CAPILLARY     Status: Abnormal   Collection Time   03/18/11 11:53 AM      Component Value Range Comment   Glucose-Capillary 410 (*) 70 - 99 (mg/dL)   GLUCOSE, CAPILLARY     Status: Abnormal   Collection Time   03/18/11  4:33 PM      Component Value Range Comment   Glucose-Capillary 389 (*) 70 - 99 (mg/dL)   GLUCOSE, CAPILLARY     Status: Abnormal   Collection Time   03/18/11 10:14 PM      Component Value Range Comment   Glucose-Capillary 281 (*) 70 - 99 (mg/dL)   BASIC METABOLIC PANEL     Status: Abnormal   Collection Time   03/19/11  5:00 AM      Component Value Range Comment   Sodium 136  135 - 145 (mEq/L)    Potassium 2.8 (*) 3.5 - 5.1 (mEq/L)    Chloride 99  96 - 112 (mEq/L)    CO2 28  19 - 32 (mEq/L)    Glucose, Bld 158 (*) 70 - 99 (mg/dL)    BUN 5 (*) 6 - 23 (mg/dL)    Creatinine, Ser 9.14  0.50 - 1.35 (mg/dL)    Calcium 9.0  8.4 - 10.5 (mg/dL)    GFR calc non Af Amer >90  >90 (mL/min)    GFR calc Af Amer >90  >90 (mL/min)   GLUCOSE, CAPILLARY     Status: Abnormal   Collection Time   03/19/11  7:57 AM      Component Value Range Comment   Glucose-Capillary 135 (*) 70 - 99 (mg/dL)     Dg Abd Acute W/chest  03/17/2011  *RADIOLOGY REPORT*  Clinical Data: 51 year old male with vomiting and constipation.  ACUTE ABDOMEN SERIES (ABDOMEN 2 VIEW & CHEST 1 VIEW)  Comparison: 03/15/2011 and earlier.  Findings: Slightly lower lung volumes.  Crowding of markings at the right lung base.  No pneumothorax or pneumoperitoneum.  Cardiac size and mediastinal contours are within normal limits.   Nonobstructed bowel gas pattern.  Abdominal and pelvic visceral contours are within normal limits. No acute osseous abnormality identified.  IMPRESSION: Nonobstructed bowel gas pattern, no free air.  Lower lung volumes with minor atelectasis.  Original Report Authenticated By: Harley Hallmark, M.D.   Dg Abd Acute W/chest  03/15/2011  *RADIOLOGY REPORT*  Clinical Data: Abdominal pain  ACUTE ABDOMEN SERIES (ABDOMEN 2 VIEW & CHEST 1 VIEW)  Comparison: 10/23/2010  Findings: Lungs are clear.  Heart size normal.  No effusion.  No free air.  Normal bowel gas pattern.  No abnormal abdominal calcifications.  Regional bones unremarkable.  IMPRESSION:  1.  Normal bowel gas pattern. 2.  No free air. 3.  No acute cardiopulmonary disease.  Original Report Authenticated By: Lysle Rubens  HASSELL III, M.D.     Disposition: home  Diet: diabetic/cardiac  Activity: gradually increase  Follow-up Appts: Dr. Delrae Alfred in 1-2 weeks  Discharge Orders    Future Orders Please Complete By Expires   Diet - low sodium heart healthy      Diet Carb Modified      Increase activity slowly      Discharge instructions      Comments:   No alcohol     Patient called at discharge request percocet as he is out of his home meds.  Told him to call his PCP since he has been in the hopsital getting them, he should have some at home.   Time spent on discharge, talking to the patient, and coordinating care: 35 mins.   SignedMarlin Canary, DO 03/19/2011, 8:45 AM

## 2011-03-19 NOTE — Progress Notes (Signed)
Gabriel Rush discharged Home per MD order.  Discharge instructions reviewed and discussed with the patient, all questions and concerns answered. Copy of instructions and scripts given to patient.   Gabriel Rush, Gabriel Rush  Home Medication Instructions [id number]   Printed on:03/19/11 1626  Medication Information                    Insulin Glargine (LANTUS [place]) Inject 40 Units into the skin at bedtime.            insulin aspart (NOVOLOG) 100 UNIT/ML injection Inject 14-18 Units into the skin 3 (three) times daily before meals. Based on sliding scale           gabapentin (NEURONTIN) 300 MG capsule Take 300 mg by mouth 3 (three) times daily.             amylase-lipase-protease (PANGESTYME CN-20) 66.05-27-73 MU per capsule Take 1 capsule by mouth 3 (three) times daily with meals. Usually eats 2 large meals per day           pantoprazole (PROTONIX) 40 MG tablet Take 40 mg by mouth daily.             phenytoin (DILANTIN) 100 MG ER capsule Take 200 mg by mouth at bedtime.             omeprazole (PRILOSEC) 20 MG capsule Take 1 capsule (20 mg total) by mouth daily.           oxyCODONE-acetaminophen (PERCOCET) 5-325 MG per tablet Take 1-2 tablets by mouth every 4 (four) hours as needed. pain            acetaminophen (TYLENOL) 500 MG tablet Take 1,000 mg by mouth every 6 (six) hours as needed. For pain           amLODipine (NORVASC) 5 MG tablet Take 1 tablet (5 mg total) by mouth daily.           folic acid (FOLVITE) 1 MG tablet Take 1 tablet (1 mg total) by mouth daily.           Multiple Vitamin (MULITIVITAMIN WITH MINERALS) TABS Take 1 tablet by mouth daily.           thiamine 100 MG tablet Take 1 tablet (100 mg total) by mouth daily.             Patients skin is clean, dry and intact, pt has a scab to his rt knee and scattered dark areas to BUE areas that pt states is eczema IV site discontinued and catheter remains intact. Site without signs and symptoms of complications.  Dressing and pressure applied.  Patient ambulated to car with NT,  no distress noted upon discharge.  Gabriel Rush [facility] 03/19/2011 4:26 PM

## 2011-03-19 NOTE — Progress Notes (Signed)
Patient ID: Gabriel Rush, male   DOB: August 21, 1960, 51 y.o.   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  In the ER patient was found to be having severe metabolic acidosis with high blood sugar.  Subjective: Patient feeling better, wanting to go home, had BM x 2  Objective: Weight change:   Intake/Output Summary (Last 24 hours) at 03/19/11 0831 Last data filed at 03/18/11 1700  Gross per 24 hour  Intake    702 ml  Output      0 ml  Net    702 ml   Blood pressure 147/88, pulse 71, temperature 97.9 F (36.6 C), temperature source Oral, resp. rate 16, height 5\' 11"  (1.803 m), weight 95.8 kg (211 lb 3.2 oz), SpO2 98.00%.  Physical Exam:  General: No acute respiratory distress, cooperative, alert Lungs: Clear to auscultation bilaterally without wheezes or crackles Cardiovascular: Regular rhythm but tachycardic without murmur gallop or rub normal S1 and S2 Abdomen: distended, tender to palpation in epigastrium - BS +  no rebound - no appreciable mass Extremities: No significant cyanosis, clubbing, or edema bilateral lower extremities  Lab Results:  Basename 03/19/11 0500 03/18/11 0512 03/17/11 0620  NA 136 135 130*  K 2.8* 3.2* 3.5  CL 99 100 96  CO2 28 25 22   GLUCOSE 158* 352* 362*  BUN 5* 5* 6  CREATININE 0.57 0.61 0.73  CALCIUM 9.0 8.8 8.7  MG -- -- --  PHOS -- -- --     Basename 03/18/11 0512 03/17/11 0620  WBC 3.8* 6.0  NEUTROABS -- --  HGB 12.4* 12.7*  HCT 34.3* 35.8*  MCV 81.3 81.5  PLT 115* 120*    Micro Results: Recent Results (from the past 240 hour(s))  MRSA PCR SCREENING     Status: Normal   Collection Time   03/15/11  6:31 AM      Component Value Range Status Comment   MRSA by PCR NEGATIVE  NEGATIVE  Final      Assessment/Plan:  Hypertension Patient  on Lisinopril & HCTZ at home.  These were stopped due to pancreatitis.  Bps 175/102 this am. Patient received PRN IV Labetolol.  Started norvasc and will titrate up as outpt  Severely uncontrolled DM2 Due to noncompliance - being transitioned to SSI - Resistant, and Lantus.  Will increase lantus to 40units qhs as of 2/8.  Will monitor closely.  Will request out patient diabetic education.- blood sugar much better today  abdom pain secondary to pancreatitis Taking 2 percocets q 4 - 6 hours for pain.  Off of IV dilaudid.    +BM.  Potassium low again.  Will replete and add miralax.   Pancreatitis - Acute on Chronic - eating small portions of low fat diet and add back pancrease with meals.  Do not want to restart HCTZ or Lisinopril secondary to pancreatitis.  ?UGIB/hematemesis Resolved.  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 Due to insulin noncompliance - bicarb has normalized, but did so very slowly - gap is essentially normal (12) - follow BMETs.  Hyperkalemia - resolved.  Now Hypokalemic.  Will replete. Due  to DKA - resolved w/ intracellular shift  Seizures (felt to be EtOH related) On home anti-seizure medication  Disposition -  Will repeat K at 1:30 and D/C home if normal   Marlin Canary, DO

## 2011-05-10 ENCOUNTER — Encounter (HOSPITAL_COMMUNITY): Payer: Self-pay | Admitting: Emergency Medicine

## 2011-05-10 ENCOUNTER — Emergency Department (HOSPITAL_COMMUNITY)
Admission: EM | Admit: 2011-05-10 | Discharge: 2011-05-10 | Disposition: A | Discharge status: Home / Self Care | Payer: Self-pay | Attending: Emergency Medicine | Admitting: Emergency Medicine

## 2011-05-10 DIAGNOSIS — G40909 Epilepsy, unspecified, not intractable, without status epilepticus: Secondary | ICD-10-CM | POA: Insufficient documentation

## 2011-05-10 DIAGNOSIS — G8929 Other chronic pain: Secondary | ICD-10-CM | POA: Insufficient documentation

## 2011-05-10 DIAGNOSIS — Z79899 Other long term (current) drug therapy: Secondary | ICD-10-CM | POA: Insufficient documentation

## 2011-05-10 DIAGNOSIS — E119 Type 2 diabetes mellitus without complications: Secondary | ICD-10-CM | POA: Insufficient documentation

## 2011-05-10 DIAGNOSIS — K861 Other chronic pancreatitis: Secondary | ICD-10-CM | POA: Insufficient documentation

## 2011-05-10 DIAGNOSIS — R109 Unspecified abdominal pain: Secondary | ICD-10-CM | POA: Insufficient documentation

## 2011-05-10 DIAGNOSIS — I10 Essential (primary) hypertension: Secondary | ICD-10-CM | POA: Insufficient documentation

## 2011-05-10 LAB — CBC
HCT: 41.5 % (ref 39.0–52.0)
Hemoglobin: 15.1 g/dL (ref 13.0–17.0)
MCH: 29.4 pg (ref 26.0–34.0)
MCHC: 36.4 g/dL — ABNORMAL HIGH (ref 30.0–36.0)
MCV: 80.9 fL (ref 78.0–100.0)
RDW: 12.1 % (ref 11.5–15.5)

## 2011-05-10 LAB — COMPREHENSIVE METABOLIC PANEL
AST: 28 U/L (ref 0–37)
Albumin: 3.7 g/dL (ref 3.5–5.2)
BUN: 6 mg/dL (ref 6–23)
Calcium: 9.2 mg/dL (ref 8.4–10.5)
Chloride: 97 mEq/L (ref 96–112)
Creatinine, Ser: 0.7 mg/dL (ref 0.50–1.35)
Total Bilirubin: 0.3 mg/dL (ref 0.3–1.2)

## 2011-05-10 LAB — DIFFERENTIAL
Basophils Absolute: 0 10*3/uL (ref 0.0–0.1)
Basophils Relative: 0 % (ref 0–1)
Eosinophils Absolute: 0 10*3/uL (ref 0.0–0.7)
Eosinophils Relative: 1 % (ref 0–5)
Monocytes Absolute: 0.2 10*3/uL (ref 0.1–1.0)
Monocytes Relative: 5 % (ref 3–12)
Neutro Abs: 2.1 10*3/uL (ref 1.7–7.7)

## 2011-05-10 LAB — URINALYSIS, ROUTINE W REFLEX MICROSCOPIC
Ketones, ur: NEGATIVE mg/dL
Leukocytes, UA: NEGATIVE
Nitrite: NEGATIVE
Protein, ur: NEGATIVE mg/dL
pH: 5.5 (ref 5.0–8.0)

## 2011-05-10 LAB — URINE MICROSCOPIC-ADD ON

## 2011-05-10 LAB — LIPASE, BLOOD: Lipase: 9 U/L — ABNORMAL LOW (ref 11–59)

## 2011-05-10 MED ORDER — ONDANSETRON HCL 4 MG/2ML IJ SOLN
4.0000 mg | Freq: Once | INTRAMUSCULAR | Status: AC
  Administered 2011-05-10: 4 mg via INTRAVENOUS
  Filled 2011-05-10: qty 2

## 2011-05-10 MED ORDER — HYDROMORPHONE HCL PF 1 MG/ML IJ SOLN
1.0000 mg | Freq: Once | INTRAMUSCULAR | Status: AC
  Administered 2011-05-10: 1 mg via INTRAVENOUS
  Filled 2011-05-10: qty 1

## 2011-05-10 MED ORDER — SODIUM CHLORIDE 0.9 % IV BOLUS (SEPSIS)
1000.0000 mL | Freq: Once | INTRAVENOUS | Status: AC
  Administered 2011-05-10: 1000 mL via INTRAVENOUS

## 2011-05-10 MED ORDER — SODIUM CHLORIDE 0.9 % IV SOLN
Freq: Once | INTRAVENOUS | Status: AC
  Administered 2011-05-10: 125 mL/h via INTRAVENOUS

## 2011-05-10 MED ORDER — OXYCODONE-ACETAMINOPHEN 5-325 MG PO TABS: 1.0000 | ORAL_TABLET | ORAL | Status: DC | PRN

## 2011-05-10 MED ORDER — PANTOPRAZOLE SODIUM 40 MG IV SOLR
40.0000 mg | Freq: Once | INTRAVENOUS | Status: AC
  Administered 2011-05-10: 40 mg via INTRAVENOUS
  Filled 2011-05-10: qty 40

## 2011-05-10 NOTE — Discharge Instructions (Signed)
Do not drink any alcohol whatsoever. Every time he drink alcohol, your pancreas will get inflamed and you'll develop severe abdominal pain.  Acute Pancreatitis The pancreas is a large gland located behind your stomach. It produces (secretes) enzymes. These enzymes help digest food. It also releases the hormones glucagon and insulin. These hormones help regulate blood sugar. When the pancreas becomes inflamed, the disease is called pancreatitis. Inflammation of the pancreas occurs when enzymes from the pancreas begin attacking and digesting the pancreas. CAUSES  Most cases ofsudden onset (acute) pancreatitis are caused by:  Alcohol abuse.   Gallstones.  Other less common causes are:  Some medications.   Exposure to certain chemicals   Infection.   Damage caused by an accident (trauma).   Surgery of the belly (abdomen).  SYMPTOMS  Acute pancreatitis usually begins with pain in the upper abdomen and may radiate to the back. This pain may last a couple days. The constant pain varies from mild to severe. The acute form of this disease may vary from mild, nonspecific abdominal pain to profound shock with coma. About 1 in 5 cases are severe. These patients become dehydrated and develop low blood pressure. In severe cases, bleeding into the pancreas can lead to shock and death. The lungs, heart, and kidneys may fail. DIAGNOSIS  Your caregiver will form a clinical opinion after giving you an exam. Laboratory work is used to confirm this diagnosis. Often,a digestive enzyme from the pancreas (serum amylase) and other enzymes are elevated. Sugars and fats (lipids) in the blood may be elevated. There may also be changes in the following levels: calcium, magnesium, potassium, chloride and bicarbonate (chemicals in the blood). X-rays, a CT scan, or ultrasound of your abdomen may be necessary to search for other causes of your abdominal pain. TREATMENT  Most pancreatitis requires treatment of symptoms.  Most acute attacks last a couple of days. Your caregiver can discuss the treatment options with you.  If complications occur, hospitalization may be necessary for pain control and intravenous (IV) fluid replacement.   Sometimes, a tube may be put into the stomach to control vomiting.   Food may not be allowed for 3 to 4 days. This gives the pancreas time to rest. Giving the pancreas a rest means there is no stimulation that would produce more enzymes and cause more damage.   Medicines (antibiotics) that kill germs may be given if infection is the cause.   Sometimes, surgery may be required.   Following an acute attack, your caregiver will determine the cause, if possible, and offer suggestions to prevent recurrences.  HOME CARE INSTRUCTIONS   Eat smaller, more frequent meals. This reduces the amount of digestive juices the pancreas produces.   Decrease the amount of fat in your diet. This may help reduce loose, diarrheal stools.   Drink enough water and fluids to keep your urine clear or pale yellow. This is to avoid dehydration which can cause increased pain.   Talk to your caregiver about pain relievers or other medicines that may help.   Avoid anything that may have triggered your pancreatitis (for example, alcohol).   Follow the diet advised by your caregiver. Do not advance the diet too soon.   Take medicines as prescribed.   Get plenty of rest.   Check your blood sugar at home as directed by your caregiver.   If your caregiver has given you a follow-up appointment, it is very important to keep that appointment. Not keeping the appointment could result  in a lasting (chronic) or permanent injury, pain, and disability. If there is any problem keeping the appointment, you must call to reschedule.  SEEK MEDICAL CARE IF:   You are not recovering in the time described by your caregiver.   You have persistent pain, weakness, or feel sick to your stomach (nauseous).   You have  recovered and then have another bout of pain.  SEEK IMMEDIATE MEDICAL CARE IF:   You are unable to eat or keep fluids down.   Your pain increases a lot or changes.   You have an oral temperature above 102 F (38.9 C), not controlled by medicine.   Your skin or the white part of your eyes look yellow (jaundice).   You develop vomiting.   You feel dizzy or faint.   Your blood sugar is high (over 300).  MAKE SURE YOU:   Understand these instructions.   Will watch your condition.   Will get help right away if you are not doing well or get worse.  Document Released: 01/24/2005 Document Revised: 01/13/2011 Document Reviewed: 09/07/2007 [facility] Patient Information 2012 [place], [place].

## 2011-05-10 NOTE — ED Notes (Addendum)
Pt was picked upon his way home after drinking 1Liter of vika, stomach started hurting.

## 2011-05-10 NOTE — ED Provider Notes (Signed)
History     CSN: [id number]  Arrival date & time 05/10/11  0246   First MD Initiated Contact with Patient 05/10/11 [phone]      Chief Complaint  Patient presents with  . Abdominal Pain    (Consider location/radiation/quality/duration/timing/severity/associated sxs/prior treatment) Patient is a 51 y.o. male presenting with abdominal pain. The history is provided by the patient.  Abdominal Pain The primary symptoms of the illness include abdominal pain.  He has a history of chronic pancreatitis. Yesterday, he drank half a liter of vodka and and later  In the day he started having severe upper abdominal pain with radiation through to the back typical of his pancreatitis. Pain is rated at 8/10. It is worse with palpation. Nothing makes it better. There has not been any nausea or vomiting. He denies fever, chills, sweats.  Past Medical History  Diagnosis Date  . Diabetes mellitus   . Hypertension   . Seizures   . Pancreatitis   . Alcohol abuse   . Chronic abdominal pain     Past Surgical History  Procedure Date  . Appendectomy     History reviewed. No pertinent family history.  History  Substance Use Topics  . Smoking status: Former Smoker    Types: Cigarettes    Quit date: 12/21/1984  . Smokeless tobacco: Not on file  . Alcohol Use: Yes     heavy drinker: binge drinking per pt      Review of Systems  Gastrointestinal: Positive for abdominal pain.  All other systems reviewed and are negative.    Allergies  Morphine and related  Home Medications   Current Outpatient Rx  Name Route Sig Dispense Refill  . ACETAMINOPHEN 500 MG PO TABS Oral Take 1,000 mg by mouth every 6 (six) hours as needed. For pain    . AMLODIPINE BESYLATE 5 MG PO TABS Oral Take 1 tablet (5 mg total) by mouth daily. 30 tablet 0  . AMYLASE-LIPASE-PROTEASE 66.05-27-73 MU PO CPEP Oral Take 1 capsule by mouth 3 (three) times daily with meals. Usually eats 2 large meals per day    . FOLIC ACID 1 MG PO  TABS Oral Take 1 tablet (1 mg total) by mouth daily.    Marland Kitchen GABAPENTIN 300 MG PO CAPS Oral Take 300 mg by mouth 3 (three) times daily.      . INSULIN ASPART 100 UNIT/ML [place] SOLN Subcutaneous Inject 14-18 Units into the skin 3 (three) times daily before meals. Based on sliding scale    . LANTUS [place] Subcutaneous Inject 40 Units into the skin at bedtime.     . ADULT MULTIVITAMIN W/MINERALS CH Oral Take 1 tablet by mouth daily.    Marland Kitchen OMEPRAZOLE 20 MG PO CPDR Oral Take 1 capsule (20 mg total) by mouth daily. 20 capsule 0  . OXYCODONE-ACETAMINOPHEN 5-325 MG PO TABS Oral Take 1-2 tablets by mouth every 4 (four) hours as needed. pain     . PANTOPRAZOLE SODIUM 40 MG PO TBEC Oral Take 40 mg by mouth daily.      Marland Kitchen PHENYTOIN SODIUM EXTENDED 100 MG PO CAPS Oral Take 200 mg by mouth at bedtime.      . THIAMINE HCL 100 MG PO TABS Oral Take 1 tablet (100 mg total) by mouth daily.      BP 125/98  Pulse 115  Temp(Src) 98.2 F (36.8 C) (Oral)  Resp 18  SpO2 96%  Physical Exam  Nursing note and vitals reviewed. 51 year old male appears uncomfortable. Vital signs  are significant for mild tachycardia with heart rate 110, mild tachypnea with respiratory rate of 22. Oxygen saturation is 96% which is normal. Head is normocephalic and atraumatic. PERRLA, EOMI. There is no scleral icterus. Oropharynx is clear. Neck is nontender and supple without adenopathy. Lungs are clear without rales, wheezes, or rhonchi. Back is nontender there's no CVA tenderness. Heart has regular rate and rhythm without murmur. Abdomen is soft, flat, with moderate to severe tenderness in the epigastric area without rebound or guarding. Peristalsis is diminished. Extremities have full range of motion, no cyanosis or edema. Skin is warm and dry without rash. Neurologic: Mental status is normal, cranial nerves are intact, there are no focal motor or sensory deficits.   ED Course  Procedures (including critical care time)  Results for orders placed  during the hospital encounter of 05/10/11  CBC      Component Value Range   WBC 4.5  4.0 - 10.5 (K/uL)   RBC 5.13  4.22 - 5.81 (MIL/uL)   Hemoglobin 15.1  13.0 - 17.0 (g/dL)   HCT 16.1  09.6 - 04.5 (%)   MCV 80.9  78.0 - 100.0 (fL)   MCH 29.4  26.0 - 34.0 (pg)   MCHC 36.4 (*) 30.0 - 36.0 (g/dL)   RDW 40.9  81.1 - 91.4 (%)   Platelets 201  150 - 400 (K/uL)  DIFFERENTIAL      Component Value Range   Neutrophils Relative 46  43 - 77 (%)   Neutro Abs 2.1  1.7 - 7.7 (K/uL)   Lymphocytes Relative 48 (*) 12 - 46 (%)   Lymphs Abs 2.2  0.7 - 4.0 (K/uL)   Monocytes Relative 5  3 - 12 (%)   Monocytes Absolute 0.2  0.1 - 1.0 (K/uL)   Eosinophils Relative 1  0 - 5 (%)   Eosinophils Absolute 0.0  0.0 - 0.7 (K/uL)   Basophils Relative 0  0 - 1 (%)   Basophils Absolute 0.0  0.0 - 0.1 (K/uL)  COMPREHENSIVE METABOLIC PANEL      Component Value Range   Sodium 137  135 - 145 (mEq/L)   Potassium 3.5  3.5 - 5.1 (mEq/L)   Chloride 97  96 - 112 (mEq/L)   CO2 23  19 - 32 (mEq/L)   Glucose, Bld 288 (*) 70 - 99 (mg/dL)   BUN 6  6 - 23 (mg/dL)   Creatinine, Ser 7.82  0.50 - 1.35 (mg/dL)   Calcium 9.2  8.4 - 95.6 (mg/dL)   Total Protein 7.5  6.0 - 8.3 (g/dL)   Albumin 3.7  3.5 - 5.2 (g/dL)   AST 28  0 - 37 (U/L)   ALT 29  0 - 53 (U/L)   Alkaline Phosphatase 94  39 - 117 (U/L)   Total Bilirubin 0.3  0.3 - 1.2 (mg/dL)   GFR calc non Af Amer >90  >90 (mL/min)   GFR calc Af Amer >90  >90 (mL/min)  LIPASE, BLOOD      Component Value Range   Lipase 9 (*) 11 - 59 (U/L)  URINALYSIS, ROUTINE W REFLEX MICROSCOPIC      Component Value Range   Color, Urine STRAW (*) YELLOW    APPearance CLEAR  CLEAR    Specific Gravity, Urine 1.021  1.005 - 1.030    pH 5.5  5.0 - 8.0    Glucose, UA >1000 (*) NEGATIVE (mg/dL)   Hgb urine dipstick NEGATIVE  NEGATIVE    Bilirubin Urine NEGATIVE  NEGATIVE    Ketones, ur NEGATIVE  NEGATIVE (mg/dL)   Protein, ur NEGATIVE  NEGATIVE (mg/dL)   Urobilinogen, UA 0.2  0.0 - 1.0  (mg/dL)   Nitrite NEGATIVE  NEGATIVE    Leukocytes, UA NEGATIVE  NEGATIVE   URINE MICROSCOPIC-ADD ON      Component Value Range   Squamous Epithelial / LPF RARE  RARE    WBC, UA 0-2  <3 (WBC/hpf)   He got good relief of pain with one dose of hydromorphone. He is requesting an additional dose of hydromorphone and wishes to try and manage this flareup at home. He is admonished to never consume any alcohol because every time he drinks he will make his pancreas inflamed and developed severe abdominal pain. He expresses understanding. He will be sent home with a prescription for Percocet for pain.  1. Abdominal pain   2. Chronic pancreatitis       MDM  Exacerbation of chronic pancreatitis secondary to alcohol use. He will be given IV hydration and IV narcotics. Laboratory workup has been initiated. Of note, on review of old records, he had a recent hospitalization for ketoacidosis which was triggered from an exacerbation of his pancreatitis.      Dione Booze, MD 05/10/11 [phone]

## 2011-05-10 NOTE — ED Notes (Signed)
Pt stated having abdominal  pain, stated " I think my pancreatics is back". Abdominal pain started around 1200, has not taken any med, attempted drinking water but it will not stay down, vomited several times since noon.

## 2011-05-20 ENCOUNTER — Emergency Department (HOSPITAL_COMMUNITY)
Admission: EM | Admit: 2011-05-20 | Discharge: 2011-05-20 | Disposition: A | Discharge status: Home / Self Care | Payer: Self-pay | Attending: Emergency Medicine | Admitting: Emergency Medicine

## 2011-05-20 ENCOUNTER — Emergency Department (HOSPITAL_COMMUNITY): Payer: Self-pay

## 2011-05-20 DIAGNOSIS — R142 Eructation: Secondary | ICD-10-CM | POA: Insufficient documentation

## 2011-05-20 DIAGNOSIS — M25519 Pain in unspecified shoulder: Secondary | ICD-10-CM | POA: Insufficient documentation

## 2011-05-20 DIAGNOSIS — M542 Cervicalgia: Secondary | ICD-10-CM | POA: Insufficient documentation

## 2011-05-20 DIAGNOSIS — R21 Rash and other nonspecific skin eruption: Secondary | ICD-10-CM | POA: Insufficient documentation

## 2011-05-20 DIAGNOSIS — Z79899 Other long term (current) drug therapy: Secondary | ICD-10-CM | POA: Insufficient documentation

## 2011-05-20 DIAGNOSIS — R109 Unspecified abdominal pain: Secondary | ICD-10-CM | POA: Insufficient documentation

## 2011-05-20 DIAGNOSIS — E119 Type 2 diabetes mellitus without complications: Secondary | ICD-10-CM | POA: Insufficient documentation

## 2011-05-20 DIAGNOSIS — F1010 Alcohol abuse, uncomplicated: Secondary | ICD-10-CM | POA: Insufficient documentation

## 2011-05-20 DIAGNOSIS — G40909 Epilepsy, unspecified, not intractable, without status epilepticus: Secondary | ICD-10-CM | POA: Insufficient documentation

## 2011-05-20 DIAGNOSIS — R110 Nausea: Secondary | ICD-10-CM | POA: Insufficient documentation

## 2011-05-20 DIAGNOSIS — I10 Essential (primary) hypertension: Secondary | ICD-10-CM | POA: Insufficient documentation

## 2011-05-20 DIAGNOSIS — M549 Dorsalgia, unspecified: Secondary | ICD-10-CM | POA: Insufficient documentation

## 2011-05-20 DIAGNOSIS — R000 Tachycardia, unspecified: Secondary | ICD-10-CM | POA: Insufficient documentation

## 2011-05-20 DIAGNOSIS — R739 Hyperglycemia, unspecified: Secondary | ICD-10-CM

## 2011-05-20 DIAGNOSIS — Z794 Long term (current) use of insulin: Secondary | ICD-10-CM | POA: Insufficient documentation

## 2011-05-20 DIAGNOSIS — R141 Gas pain: Secondary | ICD-10-CM | POA: Insufficient documentation

## 2011-05-20 LAB — COMPREHENSIVE METABOLIC PANEL
ALT: 24 U/L (ref 0–53)
AST: 27 U/L (ref 0–37)
Albumin: 3.8 g/dL (ref 3.5–5.2)
Calcium: 8.9 mg/dL (ref 8.4–10.5)
Sodium: 131 mEq/L — ABNORMAL LOW (ref 135–145)
Total Protein: 7.6 g/dL (ref 6.0–8.3)

## 2011-05-20 LAB — CBC
MCH: 29.7 pg (ref 26.0–34.0)
MCV: 81.3 fL (ref 78.0–100.0)
Platelets: 167 10*3/uL (ref 150–400)
RDW: 11.8 % (ref 11.5–15.5)
WBC: 4.1 10*3/uL (ref 4.0–10.5)

## 2011-05-20 LAB — POCT I-STAT 3, VENOUS BLOOD GAS (G3P V)
Acid-base deficit: 5 mmol/L — ABNORMAL HIGH (ref 0.0–2.0)
Bicarbonate: 20.1 mEq/L (ref 20.0–24.0)
O2 Saturation: 91 %
TCO2: 21 mmol/L (ref 0–100)

## 2011-05-20 LAB — DIFFERENTIAL
Basophils Absolute: 0.1 10*3/uL (ref 0.0–0.1)
Eosinophils Absolute: 0 10*3/uL (ref 0.0–0.7)
Eosinophils Relative: 1 % (ref 0–5)
Neutrophils Relative %: 49 % (ref 43–77)

## 2011-05-20 MED ORDER — ONDANSETRON HCL 4 MG/2ML IJ SOLN
4.0000 mg | Freq: Once | INTRAMUSCULAR | Status: AC
  Administered 2011-05-20: 4 mg via INTRAVENOUS
  Filled 2011-05-20: qty 2

## 2011-05-20 MED ORDER — SODIUM CHLORIDE 0.9 % IV BOLUS (SEPSIS)
1000.0000 mL | Freq: Once | INTRAVENOUS | Status: AC
  Administered 2011-05-20: 1000 mL via INTRAVENOUS

## 2011-05-20 MED ORDER — INSULIN ASPART 100 UNIT/ML ~~LOC~~ SOLN
10.0000 [IU] | Freq: Once | SUBCUTANEOUS | Status: AC
  Administered 2011-05-20: 10 [IU] via INTRAVENOUS
  Filled 2011-05-20: qty 1

## 2011-05-20 MED ORDER — PANTOPRAZOLE SODIUM 40 MG IV SOLR
40.0000 mg | Freq: Once | INTRAVENOUS | Status: AC
  Administered 2011-05-20: 40 mg via INTRAVENOUS
  Filled 2011-05-20: qty 40

## 2011-05-20 MED ORDER — GI COCKTAIL ~~LOC~~
30.0000 mL | Freq: Once | ORAL | Status: AC
  Administered 2011-05-20: 30 mL via ORAL
  Filled 2011-05-20: qty 30

## 2011-05-20 MED ORDER — KETOROLAC TROMETHAMINE 30 MG/ML IJ SOLN
30.0000 mg | Freq: Once | INTRAMUSCULAR | Status: AC
  Administered 2011-05-20: 30 mg via INTRAVENOUS
  Filled 2011-05-20: qty 1

## 2011-05-20 MED ORDER — INSULIN ASPART 100 UNIT/ML ~~LOC~~ SOLN: 8.0000 [IU] | Freq: Once | SUBCUTANEOUS | Status: DC

## 2011-05-20 NOTE — ED Notes (Signed)
Patient transported to X-ray 

## 2011-05-20 NOTE — Discharge Instructions (Signed)
It is recommended that you do not drink alcohol.  If you would like help with stopping your drinking you may use the resources provided below.  Please continue to follow up with your doctor to help control your blood sugar levels.  Return to the emergency room for any worsening symptoms.  Hyperglycemia Hyperglycemia occurs when the glucose (sugar) in your blood is too high. Hyperglycemia can happen for many reasons, but it most often happens to people who do not know they have diabetes or are not managing their diabetes properly.  CAUSES  Whether you have diabetes or not, there are other causes of hyperglycemia. Hyperglycemia can occur when you have diabetes, but it can also occur in other situations that you might not be as aware of, such as: Diabetes  If you have diabetes and are having problems controlling your blood glucose, hyperglycemia could occur because of some of the following reasons:   Not following your meal plan.   Not taking your diabetes medications or not taking it properly.   Exercising less or doing less activity than you normally do.   Being sick.  Pre-diabetes  This cannot be ignored. Before people develop Type 2 diabetes, they almost always have "pre-diabetes." This is when your blood glucose levels are higher than normal, but not yet high enough to be diagnosed as diabetes. Research has shown that some long-term damage to the body, especially the heart and circulatory system, may already be occurring during pre-diabetes. If you take action to manage your blood glucose when you have pre-diabetes, you may delay or prevent Type 2 diabetes from developing.  Stress  If you have diabetes, you may be "diet" controlled or on oral medications or insulin to control your diabetes. However, you may find that your blood glucose is higher than usual in the hospital whether you have diabetes or not. This is often referred to as "stress hyperglycemia." Stress can elevate your blood  glucose. This happens because of hormones put out by the body during times of stress. If stress has been the cause of your high blood glucose, it can be followed regularly by your caregiver. That way he/she can make sure your hyperglycemia does not continue to get worse or progress to diabetes.  Steroids  Steroids are medications that act on the infection fighting system (immune system) to block inflammation or infection. One side effect can be a rise in blood glucose. Most people can produce enough extra insulin to allow for this rise, but for those who cannot, steroids make blood glucose levels go even higher. It is not unusual for steroid treatments to "uncover" diabetes that is developing. It is not always possible to determine if the hyperglycemia will go away after the steroids are stopped. A special blood test called an A1c is sometimes done to determine if your blood glucose was elevated before the steroids were started.  SYMPTOMS  Thirsty.   Frequent urination.   Dry mouth.   Blurred vision.   Tired or fatigue.   Weakness.   Sleepy.   Tingling in feet or leg.  DIAGNOSIS  Diagnosis is made by monitoring blood glucose in one or all of the following ways:  A1c test. This is a chemical found in your blood.   Fingerstick blood glucose monitoring.   Laboratory results.  TREATMENT  First, knowing the cause of the hyperglycemia is important before the hyperglycemia can be treated. Treatment may include, but is not be limited to:  Education.   Change or  adjustment in medications.   Change or adjustment in meal plan.   Treatment for an illness, infection, etc.   More frequent blood glucose monitoring.   Change in exercise plan.   Decreasing or stopping steroids.   Lifestyle changes.  HOME CARE INSTRUCTIONS   Test your blood glucose as directed.   Exercise regularly. Your caregiver will give you instructions about exercise. Pre-diabetes or diabetes which comes on with  stress is helped by exercising.   Eat wholesome, balanced meals. Eat often and at regular, fixed times. Your caregiver or nutritionist will give you a meal plan to guide your sugar intake.   Being at an ideal weight is important. If needed, losing as little as 10 to 15 pounds may help improve blood glucose levels.  SEEK MEDICAL CARE IF:   You have questions about medicine, activity, or diet.   You continue to have symptoms (problems such as increased thirst, urination, or weight gain).  SEEK IMMEDIATE MEDICAL CARE IF:   You are vomiting or have diarrhea.   Your breath smells fruity.   You are breathing faster or slower.   You are very sleepy or incoherent.   You have numbness, tingling, or pain in your feet or hands.   You have chest pain.   Your symptoms get worse even though you have been following your caregiver's orders.   If you have any other questions or concerns.  Document Released: 07/20/2000 Document Revised: 01/13/2011 Document Reviewed: 09/15/2008 [facility] Patient Information 2012 [place], [place].   How Much is Too Much Alcohol? Drinking too much alcohol can cause injury, accidents, and health problems. These types of problems can include:   Car crashes.   Falls.   Family fighting (domestic violence).   Drowning.   Fights.   Injuries.   Burns.   Damage to certain organs.   Having a baby with birth defects.  ONE DRINK CAN BE TOO MUCH WHEN YOU ARE:  Working.   Pregnant or breastfeeding.   Taking medicines. Ask your doctor.   Driving or planning to drive.  WHAT IS A STANDARD DRINK?   1 regular beer (12 ounces or 360 milliliters).   1 glass of wine (5 ounces or 150 milliliters).   1 shot of liquor (1.5 ounces or 45 milliliters).  BLOOD ALCOHOL LEVELS   .00 A person is sober.   Marland Kitchen03 A person has no trouble keeping balance, talking, or seeing right, but a "buzz" may be felt.   Marland Kitchen05 A person feels "buzzed" and relaxed.   Marland Kitchen08 or .10  A  person is drunk. He or she has trouble talking, seeing right, and keeping his or her balance.   .15 A person loses body control and may pass out (blackout).   .20 A person has trouble walking (staggering) and throws up (vomits).   .30 A person will pass out (unconscious).   .40+ A person will be in a coma. Death is possible.  If you or someone you know has a drinking problem, get help from a doctor.  Document Released: 11/20/2008 Document Revised: 01/13/2011 Document Reviewed: 11/20/2008 [facility] Patient Information 2012 [place], [place].   Alcohol Problems Most adults who drink alcohol drink in moderation (not a lot) are at low risk for developing problems related to their drinking. However, all drinkers, including low-risk drinkers, should know about the health risks connected with drinking alcohol. RECOMMENDATIONS FOR LOW-RISK DRINKING  Drink in moderation. Moderate drinking is defined as follows:   Men - no more than  2 drinks per day.   Nonpregnant women - no more than 1 drink per day.   Over age 49 - no more than 1 drink per day.  A standard drink is 12 grams of pure alcohol, which is equal to a 12 ounce bottle of beer or wine cooler, a 5 ounce glass of wine, or 1.5 ounces of distilled spirits (such as whiskey, brandy, vodka, or rum).  ABSTAIN FROM (DO NOT DRINK) ALCOHOL:  When pregnant or considering pregnancy.   When taking a medication that interacts with alcohol.   If you are alcohol dependent.   A medical condition that prohibits drinking alcohol (such as ulcer, liver disease, or heart disease).  DISCUSS WITH YOUR CAREGIVER:  If you are at risk for coronary heart disease, discuss the potential benefits and risks of alcohol use: Light to moderate drinking is associated with lower rates of coronary heart disease in certain populations (for example, men over age 43 and postmenopausal women). Infrequent or nondrinkers are advised not to begin light to moderate drinking to  reduce the risk of coronary heart disease so as to avoid creating an alcohol-related problem. Similar protective effects can likely be gained through proper diet and exercise.   Women and the elderly have smaller amounts of body water than men. As a result women and the elderly achieve a higher blood alcohol concentration after drinking the same amount of alcohol.   Exposing a fetus to alcohol can cause a broad range of birth defects referred to as Fetal Alcohol Syndrome (FAS) or Alcohol-Related Birth Defects (ARBD). Although FAS/ARBD is connected with excessive alcohol consumption during pregnancy, studies also have reported neurobehavioral problems in infants born to mothers reporting drinking an average of 1 drink per day during pregnancy.   Heavier drinking (the consumption of more than 4 drinks per occasion by men and more than 3 drinks per occasion by women) impairs learning (cognitive) and psychomotor functions and increases the risk of alcohol-related problems, including accidents and injuries.  CAGE QUESTIONS:   Have you ever felt that you should Cut down on your drinking?   Have people Annoyed you by criticizing your drinking?   Have you ever felt bad or Guilty about your drinking?   Have you ever had a drink first thing in the morning to steady your nerves or get rid of a hangover (Eye opener)?  If you answered positively to any of these questions: You may be at risk for alcohol-related problems if alcohol consumption is:   Men: Greater than 14 drinks per week or more than 4 drinks per occasion.   Women: Greater than 7 drinks per week or more than 3 drinks per occasion.  Do you or your family have a medical history of alcohol-related problems, such as:  Blackouts.   Sexual dysfunction.   Depression.   Trauma.   Liver dysfunction.   Sleep disorders.   Hypertension.   Chronic abdominal pain.   Has your drinking ever caused you problems, such as problems with your  family, problems with your work (or school) performance, or accidents/injuries?   Do you have a compulsion to drink or a preoccupation with drinking?   Do you have poor control or are you unable to stop drinking once you have started?   Do you have to drink to avoid withdrawal symptoms?   Do you have problems with withdrawal such as tremors, nausea, sweats, or mood disturbances?   Does it take more alcohol than in the past to get  you high?   Do you feel a strong urge to drink?   Do you change your plans so that you can have a drink?   Do you ever drink in the morning to relieve the shakes or a hangover?  If you have answered a number of the previous questions positively, it may be time for you to talk to your caregivers, family, and friends and see if they think you have a problem. Alcoholism is a chemical dependency that keeps getting worse and will eventually destroy your health and relationships. Many alcoholics end up dead, impoverished, or in prison. This is often the end result of all chemical dependency.  Do not be discouraged if you are not ready to take action immediately.   Decisions to change behavior often involve up and down desires to change and feeling like you cannot decide.   Try to think more seriously about your drinking behavior.   Think of the reasons to quit.  WHERE TO GO FOR ADDITIONAL INFORMATION   The National Institute on Alcohol Abuse and Alcoholism (NIAAA)www.niaaa.nih.gov   [employer] on Alcoholism and Drug Dependence (NCADD)www.ncadd.org   American Society of Addiction Medicine (ASAM)www.https://anderson-johnson.com/  Document Released: 01/24/2005 Document Revised: 01/13/2011 Document Reviewed: 09/12/2007 [facility] Patient Information 2012 [place], [place].    Abdominal Pain Abdominal pain can be caused by many things. Your caregiver decides the seriousness of your pain by an examination and possibly blood tests and X-rays. Many cases can be observed and treated at  home. Most abdominal pain is not caused by a disease and will probably improve without treatment. However, in many cases, more time must pass before a clear cause of the pain can be found. Before that point, it may not be known if you need more testing, or if hospitalization or surgery is needed. HOME CARE INSTRUCTIONS   Do not take laxatives unless directed by your caregiver.   Take pain medicine only as directed by your caregiver.   Only take over-the-counter or prescription medicines for pain, discomfort, or fever as directed by your caregiver.   Try a clear liquid diet (broth, tea, or water) for as long as directed by your caregiver. Slowly move to a bland diet as tolerated.  SEEK IMMEDIATE MEDICAL CARE IF:   The pain does not go away.   You have a fever.   You keep throwing up (vomiting).   The pain is felt only in portions of the abdomen. Pain in the right side could possibly be appendicitis. In an adult, pain in the left lower portion of the abdomen could be colitis or diverticulitis.   You pass bloody or black tarry stools.  MAKE SURE YOU:   Understand these instructions.   Will watch your condition.   Will get help right away if you are not doing well or get worse.  Document Released: 11/03/2004 Document Revised: 01/13/2011 Document Reviewed: 09/12/2007 [facility] Patient Information 2012 [place], [place].   RESOURCE GUIDE  Dental Problems  Patients with Medicaid: [facility] [phone]                                           [phone] [address] [employer] [address] Phone:  [phone]  Phone:  [phone]  If unable to pay or uninsured, contact:  [facility] or [facility]. to become qualified for the adult dental clinic.  Chronic Pain Problems Contact [facility]  [phone] Patients need to be referred by their primary care  doctor.  Insufficient Money for Medicine Contact [employer]:  call "211" or [facility] [phone].  No Primary Care Doctor Call [facility]  [phone] Other agencies that provide inexpensive medical care    [facility]  [phone]    [facility]  [phone]    [facility]  [phone]    [facility]  [phone]    [facility]  [phone]    [facility]  [phone]  Psychological Services [facility]  [phone] [facility] Services  [phone] [facility]   [phone] (emergency services [phone])  Substance Abuse Resources [facility]  [phone] [facility] [phone] [facility] [phone] Floydene Flock [phone] Residential & Outpatient Substance Abuse Program  [phone]  Abuse/Neglect [facility] Child Abuse Hotline [phone] [facility] Child Abuse Hotline 581-405-7768 (After Hours)  Emergency Shelter [facility] [phone]  Maternity Homes [facility] at [facility] [place] [facility] [phone] [facility] [phone]  MRSA Hotline #:   (346) 482-7315    [facility] Resources  [facility] [employer]     [employer]                          [facility] Dept. [address] [place]                       [address]      [address]  Blondell Reveal Phone:  [phone]                                   Phone:  [phone]                 Phone:  [phone]  [facility] Phone:  [phone]  [facility] Child Abuse Hotline [phone] [phone] (After Hours)

## 2011-05-20 NOTE — ED Notes (Signed)
Per [facility], he was out drinking tonight. He had multiple beers and mixed drinks. Walked home and laid down on the sidewalk and he is having RUQ pain that radiates to lower quadrant and back. C/O of nausea but no vomiting. Vitals: 150/90, 92, 16-18. No IV and No medication. No cardiac or respiratory distress. Will continue to monitor.

## 2011-05-20 NOTE — ED Provider Notes (Signed)
History     CSN: [id number]  Arrival date & time 05/20/11  3299   First MD Initiated Contact with Patient 05/20/11 0300      Chief Complaint  Patient presents with  . Abdominal Pain  . Nausea  . Emesis     HPI  History provided by the patient. Patient is a 51 year old male with history of hypertension, diabetes, chronic pancreatitis and alcohol abuse who presents with complaints of upper abdominal pains. Patient states that he aggravated his pancreas by drinking last night. He states pain began to increase around 10 PM. He admits to heavy beer and liquor use. Patient has history of multiple visits the emergency room for similar episodes. Pain feels the same as prior chronic pancreatitis symptoms. Patient reports having some associated nausea but denies episodes of vomiting. Pain does radiate some to the back. Patient has not done anything for her symptoms. He denies any aggravating or alleviating factors. Patient also complains of some pain and rash to the back of his neck and shoulder. He reports this has been present for the past several days. Patient denies history of similar rash in the past. Patient has no other complaints. She denies symptoms of fever, chills, sweats, diarrhea or constipation.    Past Medical History  Diagnosis Date  . Diabetes mellitus   . Hypertension   . Seizures   . Pancreatitis   . Alcohol abuse   . Chronic abdominal pain     Past Surgical History  Procedure Date  . Appendectomy     No family history on file.  History  Substance Use Topics  . Smoking status: Former Smoker    Types: Cigarettes    Quit date: 12/21/1984  . Smokeless tobacco: Not on file  . Alcohol Use: Yes     heavy drinker: binge drinking per pt      Review of Systems  Constitutional: Negative for fever and chills.  Respiratory: Negative for cough and shortness of breath.   Cardiovascular: Negative for chest pain.  Gastrointestinal: Positive for nausea and abdominal  pain. Negative for vomiting, diarrhea and constipation.  Genitourinary: Negative for dysuria, frequency, hematuria and flank pain.    Allergies  Morphine and related  Home Medications   Current Outpatient Rx  Name Route Sig Dispense Refill  . ACETAMINOPHEN 500 MG PO TABS Oral Take 1,000 mg by mouth every 6 (six) hours as needed. For pain    . AMLODIPINE BESYLATE 5 MG PO TABS Oral Take 1 tablet (5 mg total) by mouth daily. 30 tablet 0  . AMYLASE-LIPASE-PROTEASE 66.05-27-73 MU PO CPEP Oral Take 1 capsule by mouth 3 (three) times daily with meals. Usually eats 2 large meals per day    . FOLIC ACID 1 MG PO TABS Oral Take 1 tablet (1 mg total) by mouth daily.    Marland Kitchen GABAPENTIN 300 MG PO CAPS Oral Take 300 mg by mouth 3 (three) times daily.      . INSULIN ASPART 100 UNIT/ML [place] SOLN Subcutaneous Inject 14-18 Units into the skin 3 (three) times daily before meals. Based on sliding scale    . LANTUS [place] Subcutaneous Inject 40 Units into the skin at bedtime.     . ADULT MULTIVITAMIN W/MINERALS CH Oral Take 1 tablet by mouth daily.    Marland Kitchen OMEPRAZOLE 20 MG PO CPDR Oral Take 1 capsule (20 mg total) by mouth daily. 20 capsule 0  . OXYCODONE-ACETAMINOPHEN 5-325 MG PO TABS Oral Take 1-2 tablets by mouth every 4 (  four) hours as needed. pain     . OXYCODONE-ACETAMINOPHEN 5-325 MG PO TABS Oral Take 1 tablet by mouth every 4 (four) hours as needed for pain. 20 tablet 0  . PANTOPRAZOLE SODIUM 40 MG PO TBEC Oral Take 40 mg by mouth daily.      Marland Kitchen PHENYTOIN SODIUM EXTENDED 100 MG PO CAPS Oral Take 200 mg by mouth at bedtime.      . THIAMINE HCL 100 MG PO TABS Oral Take 1 tablet (100 mg total) by mouth daily.      There were no vitals taken for this visit.  Physical Exam  Nursing note and vitals reviewed. Constitutional: He is oriented to person, place, and time. He appears well-developed and well-nourished. No distress.  HENT:  Head: Normocephalic and atraumatic.  Neck: Normal range of motion. Neck supple.        No meningeal signs  Cardiovascular: Regular rhythm.  Tachycardia present.   Pulmonary/Chest: Effort normal and breath sounds normal. No respiratory distress. He has no wheezes. He has no rales.  Abdominal: Soft. He exhibits distension. There is tenderness in the epigastric area and left upper quadrant. There is no rebound, no guarding, no tenderness at McBurney's point and negative Murphy's sign.  Neurological: He is alert and oriented to person, place, and time.  Skin: Skin is warm. Rash noted.       Herpetic rash with grouped vesicles along the right posterior neck extending to track these he is area. Skin is tender to light palpation.  Psychiatric: He has a normal mood and affect. His behavior is normal.    ED Course  Procedures   Results for orders placed during the hospital encounter of 05/20/11  CBC      Component Value Range   WBC 4.1  4.0 - 10.5 (K/uL)   RBC 5.08  4.22 - 5.81 (MIL/uL)   Hemoglobin 15.1  13.0 - 17.0 (g/dL)   HCT 95.6  21.3 - 08.6 (%)   MCV 81.3  78.0 - 100.0 (fL)   MCH 29.7  26.0 - 34.0 (pg)   MCHC 36.6 (*) 30.0 - 36.0 (g/dL)   RDW 57.8  46.9 - 62.9 (%)   Platelets 167  150 - 400 (K/uL)  DIFFERENTIAL      Component Value Range   Neutrophils Relative 49  43 - 77 (%)   Neutro Abs 2.0  1.7 - 7.7 (K/uL)   Lymphocytes Relative 40  12 - 46 (%)   Lymphs Abs 1.7  0.7 - 4.0 (K/uL)   Monocytes Relative 9  3 - 12 (%)   Monocytes Absolute 0.4  0.1 - 1.0 (K/uL)   Eosinophils Relative 1  0 - 5 (%)   Eosinophils Absolute 0.0  0.0 - 0.7 (K/uL)   Basophils Relative 1  0 - 1 (%)   Basophils Absolute 0.1  0.0 - 0.1 (K/uL)  COMPREHENSIVE METABOLIC PANEL      Component Value Range   Sodium 131 (*) 135 - 145 (mEq/L)   Potassium 4.0  3.5 - 5.1 (mEq/L)   Chloride 94 (*) 96 - 112 (mEq/L)   CO2 20  19 - 32 (mEq/L)   Glucose, Bld 399 (*) 70 - 99 (mg/dL)   BUN 10  6 - 23 (mg/dL)   Creatinine, Ser 5.28  0.50 - 1.35 (mg/dL)   Calcium 8.9  8.4 - 41.3 (mg/dL)   Total Protein  7.6  6.0 - 8.3 (g/dL)   Albumin 3.8  3.5 - 5.2 (g/dL)  AST 27  0 - 37 (U/L)   ALT 24  0 - 53 (U/L)   Alkaline Phosphatase 101  39 - 117 (U/L)   Total Bilirubin 0.3  0.3 - 1.2 (mg/dL)   GFR calc non Af Amer >90  >90 (mL/min)   GFR calc Af Amer >90  >90 (mL/min)  LIPASE, BLOOD      Component Value Range   Lipase 14  11 - 59 (U/L)  GLUCOSE, CAPILLARY      Component Value Range   Glucose-Capillary 336 (*) 70 - 99 (mg/dL)  GLUCOSE, CAPILLARY      Component Value Range   Glucose-Capillary 257 (*) 70 - 99 (mg/dL)  POCT I-STAT 3, BLOOD GAS (G3P V)      Component Value Range   pH, Ven 7.336 (*) 7.250 - 7.300    pCO2, Ven 37.6 (*) 45.0 - 50.0 (mmHg)   pO2, Ven 64.0 (*) 30.0 - 45.0 (mmHg)   Bicarbonate 20.1  20.0 - 24.0 (mEq/L)   TCO2 21  0 - 100 (mmol/L)   O2 Saturation 91.0     Acid-base deficit 5.0 (*) 0.0 - 2.0 (mmol/L)   Sample type VENOUS         Dg Abd Acute W/chest  05/20/2011  *RADIOLOGY REPORT*  Clinical Data: Abdominal pain and diarrhea.  ACUTE ABDOMEN SERIES (ABDOMEN 2 VIEW & CHEST 1 VIEW)  Comparison: Chest and abdominal radiographs performed 03/17/2011  Findings: The lungs are well-aerated and clear.  There is no evidence of focal opacification, pleural effusion or pneumothorax. The cardiomediastinal silhouette is within normal limits.  The visualized bowel gas pattern is unremarkable.  Scattered stool and air are seen within the colon; there is no evidence of small bowel dilatation to suggest obstruction.  No free intra-abdominal air is identified on the provided upright view.  No acute osseous abnormalities are seen; the sacroiliac joints are unremarkable in appearance.  IMPRESSION:  1.  Unremarkable bowel gas pattern; no free intra-abdominal air seen. 2.  No acute cardiopulmonary process identified.  Original Report Authenticated By: Tonia Ghent, M.D.     1. Abdominal pain   2. Hyperglycemia   3. Alcohol abuse       MDM  2:55 AM patient seen and evaluated.  Patient in no acute distress. Patient is uncomfortable appearing.  Pt discussed with Attending physician.  No signs for DKA.  Sugar has improved with fluids and insulin.  Pt pain improved.  Will d/c home at this time.      Angus Seller, [place] 05/20/11 [phone]

## 2011-05-20 NOTE — ED Provider Notes (Signed)
Medical screening examination/treatment/procedure(s) were performed by non-physician practitioner and as supervising physician I was immediately available for consultation/collaboration.   Celene Kras, MD 05/20/11 [phone]

## 2011-05-20 NOTE — ED Notes (Signed)
Patient asked me to hold his Toradol until he gets back from radiology.

## 2011-05-21 ENCOUNTER — Emergency Department (HOSPITAL_COMMUNITY)
Admission: EM | Admit: 2011-05-21 | Discharge: 2011-05-21 | Discharge status: Against Medical Advice | Payer: Self-pay | Attending: Emergency Medicine | Admitting: Emergency Medicine

## 2011-05-21 ENCOUNTER — Encounter (HOSPITAL_COMMUNITY): Payer: Self-pay | Admitting: *Deleted

## 2011-05-21 DIAGNOSIS — R109 Unspecified abdominal pain: Secondary | ICD-10-CM | POA: Insufficient documentation

## 2011-05-21 LAB — COMPREHENSIVE METABOLIC PANEL
AST: 30 U/L (ref 0–37)
Albumin: 3.8 g/dL (ref 3.5–5.2)
Chloride: 100 mEq/L (ref 96–112)
Creatinine, Ser: 0.72 mg/dL (ref 0.50–1.35)
Sodium: 136 mEq/L (ref 135–145)
Total Bilirubin: 0.3 mg/dL (ref 0.3–1.2)

## 2011-05-21 LAB — CBC
HCT: 40.5 % (ref 39.0–52.0)
MCHC: 36.3 g/dL — ABNORMAL HIGH (ref 30.0–36.0)
Platelets: 168 10*3/uL (ref 150–400)
RDW: 12.1 % (ref 11.5–15.5)

## 2011-05-21 LAB — DIFFERENTIAL
Basophils Absolute: 0.1 10*3/uL (ref 0.0–0.1)
Basophils Relative: 2 % — ABNORMAL HIGH (ref 0–1)
Monocytes Absolute: 0.3 10*3/uL (ref 0.1–1.0)
Neutro Abs: 1.7 10*3/uL (ref 1.7–7.7)
Neutrophils Relative %: 43 % (ref 43–77)

## 2011-05-21 LAB — LIPASE, BLOOD: Lipase: 8 U/L — ABNORMAL LOW (ref 11–59)

## 2011-05-21 NOTE — ED Notes (Signed)
The pt says he has pancreatitis  He has been drinking alcohol today..  vomiting

## 2011-06-04 ENCOUNTER — Encounter (HOSPITAL_COMMUNITY): Payer: Self-pay | Admitting: Emergency Medicine

## 2011-06-04 ENCOUNTER — Inpatient Hospital Stay (HOSPITAL_COMMUNITY)
Admission: EM | Admit: 2011-06-04 | Discharge: 2011-06-07 | DRG: 439 | Disposition: A | Discharge status: Home / Self Care | Payer: Self-pay | Attending: Family Medicine | Admitting: Family Medicine

## 2011-06-04 ENCOUNTER — Inpatient Hospital Stay (HOSPITAL_COMMUNITY): Payer: Self-pay

## 2011-06-04 DIAGNOSIS — K859 Acute pancreatitis without necrosis or infection, unspecified: Principal | ICD-10-CM | POA: Diagnosis present

## 2011-06-04 DIAGNOSIS — F1010 Alcohol abuse, uncomplicated: Secondary | ICD-10-CM | POA: Diagnosis present

## 2011-06-04 DIAGNOSIS — E872 Acidosis, unspecified: Secondary | ICD-10-CM | POA: Diagnosis present

## 2011-06-04 DIAGNOSIS — R000 Tachycardia, unspecified: Secondary | ICD-10-CM

## 2011-06-04 DIAGNOSIS — K852 Alcohol induced acute pancreatitis without necrosis or infection: Secondary | ICD-10-CM | POA: Diagnosis present

## 2011-06-04 DIAGNOSIS — R109 Unspecified abdominal pain: Secondary | ICD-10-CM | POA: Diagnosis present

## 2011-06-04 DIAGNOSIS — R569 Unspecified convulsions: Secondary | ICD-10-CM | POA: Diagnosis present

## 2011-06-04 DIAGNOSIS — E782 Mixed hyperlipidemia: Secondary | ICD-10-CM | POA: Diagnosis present

## 2011-06-04 DIAGNOSIS — K861 Other chronic pancreatitis: Secondary | ICD-10-CM | POA: Diagnosis present

## 2011-06-04 DIAGNOSIS — E119 Type 2 diabetes mellitus without complications: Secondary | ICD-10-CM | POA: Diagnosis present

## 2011-06-04 DIAGNOSIS — I10 Essential (primary) hypertension: Secondary | ICD-10-CM | POA: Diagnosis present

## 2011-06-04 DIAGNOSIS — Z794 Long term (current) use of insulin: Secondary | ICD-10-CM

## 2011-06-04 DIAGNOSIS — G40802 Other epilepsy, not intractable, without status epilepticus: Secondary | ICD-10-CM | POA: Diagnosis present

## 2011-06-04 DIAGNOSIS — F1020 Alcohol dependence, uncomplicated: Secondary | ICD-10-CM | POA: Diagnosis present

## 2011-06-04 HISTORY — DX: Reserved for inherently not codable concepts without codable children: IMO0001

## 2011-06-04 HISTORY — DX: Alcohol dependence, uncomplicated: F10.20

## 2011-06-04 LAB — COMPREHENSIVE METABOLIC PANEL
ALT: 23 U/L (ref 0–53)
AST: 27 U/L (ref 0–37)
Albumin: 3.7 g/dL (ref 3.5–5.2)
Alkaline Phosphatase: 109 U/L (ref 39–117)
Potassium: 3.7 mEq/L (ref 3.5–5.1)
Sodium: 132 mEq/L — ABNORMAL LOW (ref 135–145)
Total Protein: 8 g/dL (ref 6.0–8.3)

## 2011-06-04 LAB — GLUCOSE, CAPILLARY: Glucose-Capillary: 263 mg/dL — ABNORMAL HIGH (ref 70–99)

## 2011-06-04 LAB — LIPID PANEL
Cholesterol: 324 mg/dL — ABNORMAL HIGH (ref 0–200)
HDL: 38 mg/dL — ABNORMAL LOW (ref >39)
LDL Cholesterol: UNDETERMINED mg/dL (ref 0–99)
Triglycerides: 718 mg/dL — ABNORMAL HIGH (ref <150)
VLDL: UNDETERMINED mg/dL (ref 0–40)

## 2011-06-04 LAB — POCT I-STAT 3, VENOUS BLOOD GAS (G3P V)
Bicarbonate: 29.5 mEq/L — ABNORMAL HIGH (ref 20.0–24.0)
pCO2, Ven: 46.3 mmHg (ref 45.0–50.0)
pH, Ven: 7.412 — ABNORMAL HIGH (ref 7.250–7.300)
pO2, Ven: 48 mmHg — ABNORMAL HIGH (ref 30.0–45.0)

## 2011-06-04 LAB — URINE MICROSCOPIC-ADD ON

## 2011-06-04 LAB — CBC
HCT: 41.9 % (ref 39.0–52.0)
HCT: 37.5 % — ABNORMAL LOW (ref 39.0–52.0)
Hemoglobin: 15.5 g/dL (ref 13.0–17.0)
Hemoglobin: 13.3 g/dL (ref 13.0–17.0)
MCH: 29.9 pg (ref 26.0–34.0)
MCH: 29 pg (ref 26.0–34.0)
MCHC: 37 g/dL — ABNORMAL HIGH (ref 30.0–36.0)
MCHC: 35.5 g/dL (ref 30.0–36.0)
MCV: 81.7 fL (ref 78.0–100.0)
RDW: 12 % (ref 11.5–15.5)

## 2011-06-04 LAB — URINALYSIS, ROUTINE W REFLEX MICROSCOPIC
Hgb urine dipstick: NEGATIVE
Nitrite: NEGATIVE
Specific Gravity, Urine: 1.042 — ABNORMAL HIGH (ref 1.005–1.030)
Urobilinogen, UA: 1 mg/dL (ref 0.0–1.0)

## 2011-06-04 LAB — LIPASE, BLOOD: Lipase: 9 U/L — ABNORMAL LOW (ref 11–59)

## 2011-06-04 MED ORDER — SODIUM CHLORIDE 0.9 % IV BOLUS (SEPSIS)
1000.0000 mL | Freq: Once | INTRAVENOUS | Status: AC
  Administered 2011-06-04: 1000 mL via INTRAVENOUS

## 2011-06-04 MED ORDER — ONDANSETRON HCL 4 MG/2ML IJ SOLN: 4.0000 mg | Freq: Four times a day (QID) | INTRAMUSCULAR | Status: DC | PRN

## 2011-06-04 MED ORDER — INSULIN ASPART 100 UNIT/ML ~~LOC~~ SOLN
0.0000 [IU] | Freq: Four times a day (QID) | SUBCUTANEOUS | Status: DC
Start: 2011-06-04 — End: 2011-06-05
  Administered 2011-06-04: 5 [IU] via SUBCUTANEOUS
  Administered 2011-06-04: 2 [IU] via SUBCUTANEOUS

## 2011-06-04 MED ORDER — ENOXAPARIN SODIUM 40 MG/0.4ML ~~LOC~~ SOLN
40.0000 mg | SUBCUTANEOUS | Status: DC
  Administered 2011-06-04 – 2011-06-06 (×3): 40 mg via SUBCUTANEOUS
  Filled 2011-06-04 (×4): qty 0.4

## 2011-06-04 MED ORDER — ADULT MULTIVITAMIN W/MINERALS CH
1.0000 | ORAL_TABLET | Freq: Every day | ORAL | Status: DC
  Administered 2011-06-04 – 2011-06-07 (×4): 1 via ORAL
  Filled 2011-06-04 (×4): qty 1

## 2011-06-04 MED ORDER — LORAZEPAM 2 MG/ML IJ SOLN: 0.0000 mg | Freq: Two times a day (BID) | INTRAMUSCULAR | Status: DC

## 2011-06-04 MED ORDER — INSULIN GLARGINE 100 UNIT/ML ~~LOC~~ SOLN
30.0000 [IU] | Freq: Every day | SUBCUTANEOUS | Status: DC
  Administered 2011-06-04 – 2011-06-07 (×4): 30 [IU] via SUBCUTANEOUS

## 2011-06-04 MED ORDER — VITAMIN B-1 100 MG PO TABS
100.0000 mg | ORAL_TABLET | Freq: Every day | ORAL | Status: DC
  Administered 2011-06-04 – 2011-06-07 (×4): 100 mg via ORAL
  Filled 2011-06-04 (×4): qty 1

## 2011-06-04 MED ORDER — SODIUM CHLORIDE 0.9 % IJ SOLN
3.0000 mL | Freq: Two times a day (BID) | INTRAMUSCULAR | Status: DC
  Administered 2011-06-05: 3 mL via INTRAVENOUS

## 2011-06-04 MED ORDER — CHLORHEXIDINE GLUCONATE 0.12 % MT SOLN
15.0000 mL | Freq: Two times a day (BID) | OROMUCOSAL | Status: DC
  Administered 2011-06-04 – 2011-06-07 (×6): 15 mL via OROMUCOSAL
  Filled 2011-06-04 (×9): qty 15

## 2011-06-04 MED ORDER — LORAZEPAM 1 MG PO TABS: 1.0000 mg | ORAL_TABLET | Freq: Four times a day (QID) | ORAL | Status: AC | PRN

## 2011-06-04 MED ORDER — HYDROMORPHONE HCL PF 1 MG/ML IJ SOLN
1.0000 mg | Freq: Once | INTRAMUSCULAR | Status: AC
  Administered 2011-06-04: 1 mg via INTRAVENOUS
  Filled 2011-06-04: qty 1

## 2011-06-04 MED ORDER — THIAMINE HCL 100 MG/ML IJ SOLN
100.0000 mg | Freq: Every day | INTRAMUSCULAR | Status: DC
  Filled 2011-06-04 (×4): qty 1

## 2011-06-04 MED ORDER — ONDANSETRON HCL 4 MG/2ML IJ SOLN
4.0000 mg | Freq: Once | INTRAMUSCULAR | Status: AC
  Administered 2011-06-04: 4 mg via INTRAVENOUS
  Filled 2011-06-04: qty 2

## 2011-06-04 MED ORDER — PANTOPRAZOLE SODIUM 40 MG PO TBEC
40.0000 mg | DELAYED_RELEASE_TABLET | Freq: Every day | ORAL | Status: DC
  Administered 2011-06-04 – 2011-06-07 (×4): 40 mg via ORAL
  Filled 2011-06-04 (×4): qty 1

## 2011-06-04 MED ORDER — PANCRELIPASE (LIP-PROT-AMYL) 12000-38000 UNITS PO CPEP
1.0000 | ORAL_CAPSULE | Freq: Three times a day (TID) | ORAL | Status: DC
  Administered 2011-06-04 – 2011-06-07 (×12): 1 via ORAL
  Filled 2011-06-04 (×13): qty 1

## 2011-06-04 MED ORDER — BIOTENE DRY MOUTH MT LIQD
15.0000 mL | Freq: Two times a day (BID) | OROMUCOSAL | Status: DC
  Administered 2011-06-04 – 2011-06-07 (×8): 15 mL via OROMUCOSAL

## 2011-06-04 MED ORDER — PHENYTOIN SODIUM EXTENDED 100 MG PO CAPS
200.0000 mg | ORAL_CAPSULE | Freq: Every day | ORAL | Status: DC
  Administered 2011-06-05 – 2011-06-06 (×2): 200 mg via ORAL
  Filled 2011-06-04 (×4): qty 2

## 2011-06-04 MED ORDER — LORAZEPAM 2 MG/ML IJ SOLN: 0.0000 mg | Freq: Four times a day (QID) | INTRAMUSCULAR | Status: AC

## 2011-06-04 MED ORDER — POTASSIUM CHLORIDE IN NACL 20-0.9 MEQ/L-% IV SOLN
INTRAVENOUS | Status: DC
  Administered 2011-06-04 – 2011-06-06 (×7): via INTRAVENOUS
  Administered 2011-06-07: 20 mL/h via INTRAVENOUS
  Filled 2011-06-04 (×8): qty 1000

## 2011-06-04 MED ORDER — GABAPENTIN 300 MG PO CAPS
300.0000 mg | ORAL_CAPSULE | Freq: Three times a day (TID) | ORAL | Status: DC
  Administered 2011-06-04 – 2011-06-07 (×10): 300 mg via ORAL
  Filled 2011-06-04 (×12): qty 1

## 2011-06-04 MED ORDER — ONDANSETRON HCL 4 MG PO TABS: 4.0000 mg | ORAL_TABLET | Freq: Four times a day (QID) | ORAL | Status: DC | PRN

## 2011-06-04 MED ORDER — LORAZEPAM 2 MG/ML IJ SOLN
1.0000 mg | Freq: Four times a day (QID) | INTRAMUSCULAR | Status: AC | PRN
  Administered 2011-06-07: 1 mg via INTRAVENOUS
  Filled 2011-06-04: qty 1

## 2011-06-04 MED ORDER — HYDROMORPHONE HCL PF 1 MG/ML IJ SOLN
1.0000 mg | INTRAMUSCULAR | Status: DC | PRN
  Administered 2011-06-04 – 2011-06-07 (×17): 1 mg via INTRAVENOUS
  Filled 2011-06-04 (×18): qty 1

## 2011-06-04 MED ORDER — FOLIC ACID 1 MG PO TABS
1.0000 mg | ORAL_TABLET | Freq: Every day | ORAL | Status: DC
  Administered 2011-06-04 – 2011-06-07 (×4): 1 mg via ORAL
  Filled 2011-06-04 (×4): qty 1

## 2011-06-04 NOTE — ED Provider Notes (Signed)
History     CSN: [id number]  Arrival date & time 06/04/11  0010   First MD Initiated Contact with Patient 06/04/11 0151      Chief Complaint  Patient presents with  . Emesis    (Consider location/radiation/quality/duration/timing/severity/associated sxs/prior treatment) HPI 51 year old male presents to emergency room complaining of severe upper abdominal pain and vomiting. Patient reports he had episode of heavy drinking earlier today, drank 12 pack of beer and 8 shots of vodka. Soon after developed upper abdominal pain and severe vomiting. Patient reports history of pancreatitis which is worsen when he drinks. He reports the last time he had a bad flare was in February. Patient denies any fever chest pain shortness of breath. He has had some episodes of diarrhea. Patient with history of diabetes. He reports he's been taking his insulin as directed. Past Medical History  Diagnosis Date  . Diabetes mellitus   . Hypertension   . Seizures   . Pancreatitis   . Alcohol abuse   . Chronic abdominal pain   . Alcoholism /alcohol abuse     Past Surgical History  Procedure Date  . Appendectomy     Family History  Problem Relation Age of Onset  . Hypertension      History  Substance Use Topics  . Smoking status: Former Smoker    Types: Cigarettes    Quit date: 12/21/1984  . Smokeless tobacco: Not on file  . Alcohol Use: Yes     heavy drinker: binge drinking per pt      Review of Systems  All other systems reviewed and are negative.    Allergies  Morphine and related  Home Medications   Current Outpatient Rx  Name Route Sig Dispense Refill  . ACETAMINOPHEN 500 MG PO TABS Oral Take 1,000 mg by mouth every 6 (six) hours as needed. For pain    . AMLODIPINE BESYLATE 5 MG PO TABS Oral Take 1 tablet (5 mg total) by mouth daily. 30 tablet 0  . AMYLASE-LIPASE-PROTEASE 66.05-27-73 MU PO CPEP Oral Take 1 capsule by mouth 3 (three) times daily with meals. Usually eats 2 large  meals per day    . FOLIC ACID 1 MG PO TABS Oral Take 1 tablet (1 mg total) by mouth daily.    Marland Kitchen GABAPENTIN 300 MG PO CAPS Oral Take 300 mg by mouth 3 (three) times daily.      . INSULIN ASPART 100 UNIT/ML [place] SOLN Subcutaneous Inject 14-18 Units into the skin 3 (three) times daily before meals. Based on sliding scale    . LANTUS [place] Subcutaneous Inject 40 Units into the skin at bedtime.     . ADULT MULTIVITAMIN W/MINERALS CH Oral Take 1 tablet by mouth daily.    Marland Kitchen OMEPRAZOLE 20 MG PO CPDR Oral Take 1 capsule (20 mg total) by mouth daily. 20 capsule 0  . OXYCODONE-ACETAMINOPHEN 5-325 MG PO TABS Oral Take 1-2 tablets by mouth every 4 (four) hours as needed. pain     . PANTOPRAZOLE SODIUM 40 MG PO TBEC Oral Take 40 mg by mouth daily.      Marland Kitchen PHENYTOIN SODIUM EXTENDED 100 MG PO CAPS Oral Take 200 mg by mouth at bedtime.      . THIAMINE HCL 100 MG PO TABS Oral Take 1 tablet (100 mg total) by mouth daily.      BP 134/94  Pulse 128  Temp(Src) 98 F (36.7 C) (Oral)  Resp 18  SpO2 98%  Physical Exam  Nursing note  and vitals reviewed. Constitutional: He is oriented to person, place, and time. He appears well-developed and well-nourished. He appears distressed.       Agitated in severe pain  HENT:  Head: Normocephalic and atraumatic.  Nose: Nose normal.  Mouth/Throat: Oropharynx is clear and moist.  Eyes: EOM are normal. Pupils are equal, round, and reactive to light.       Conjunctiva injected laterally  Neck: Normal range of motion. Neck supple. No JVD present. No tracheal deviation present. No thyromegaly present.  Cardiovascular: Regular rhythm, normal heart sounds and intact distal pulses.  Exam reveals no gallop and no friction rub.   No murmur heard.      Significant tachycardia noted  Pulmonary/Chest: Effort normal and breath sounds normal. No stridor. No respiratory distress. He has no wheezes. He has no rales. He exhibits no tenderness.  Abdominal: Soft. Bowel sounds are normal. He  exhibits no distension and no mass. There is tenderness (severe tenderness in epigastrium). There is no rebound and no guarding.  Musculoskeletal: Normal range of motion. He exhibits no edema and no tenderness.  Lymphadenopathy:    He has no cervical adenopathy.  Neurological: He is oriented to person, place, and time. He exhibits normal muscle tone. Coordination normal.  Skin: No rash noted. He is diaphoretic. No erythema. No pallor.  Psychiatric: He has a normal mood and affect. His behavior is normal. Judgment and thought content normal.    ED Course  Procedures (including critical care time)  Labs Reviewed  LIPASE, BLOOD - Abnormal; Notable for the following:    Lipase 9 (*)    All other components within normal limits  URINALYSIS, ROUTINE W REFLEX MICROSCOPIC - Abnormal; Notable for the following:    Specific Gravity, Urine 1.042 (*)    Glucose, UA >1000 (*)    Ketones, ur 15 (*)    Protein, ur 30 (*)    All other components within normal limits  COMPREHENSIVE METABOLIC PANEL - Abnormal; Notable for the following:    Sodium 132 (*)    Chloride 90 (*)    Glucose, Bld 300 (*)    Total Bilirubin 0.2 (*)    All other components within normal limits  ETHANOL - Abnormal; Notable for the following:    Alcohol, Ethyl (B) 214 (*)    All other components within normal limits  CBC - Abnormal; Notable for the following:    MCHC 37.0 (*)    All other components within normal limits  LACTIC ACID, PLASMA - Abnormal; Notable for the following:    Lactic Acid, Venous 3.1 (*)    All other components within normal limits  GLUCOSE, CAPILLARY - Abnormal; Notable for the following:    Glucose-Capillary 263 (*)    All other components within normal limits  POCT I-STAT 3, BLOOD GAS (G3P V) - Abnormal; Notable for the following:    pH, Ven 7.412 (*)    pO2, Ven 48.0 (*)    Bicarbonate 29.5 (*)    Acid-Base Excess 4.0 (*)    All other components within normal limits  URINE MICROSCOPIC-ADD ON    No results found.   1. Pancreatitis, alcoholic, acute   2. Abdominal pain   3. Tachycardia   4. Lactic acidosis       MDM  51 year old male with abdominal pain after drinking significant amount of alcohol with prior history of pancreatitis. Patient without elevation in lipase, however feel that given his history of chronic pancreatitis he may not show an  elevation in lipase. Patient with persistent tachycardia despite IV fluids and pain medicines no further vomiting here in the emergency department. Patient not DKA, but has anion gap of 16. Concerned that once pain medicines wear off, patient will have persistent vomiting and may go into DKA. Have discussed with hospitalist for admission and they will see in the emergency department        Olivia Mackie, MD 06/04/11 [phone]

## 2011-06-04 NOTE — ED Notes (Signed)
PT provided with blanket upon request 

## 2011-06-04 NOTE — ED Notes (Signed)
PT. SLEEPING , RESPIRATIONS UNLABORED.

## 2011-06-04 NOTE — H&P (Signed)
PCP:   Julieanne Manson, MD, MD   Chief Complaint:  Abdominal pain, nausea vomiting  HPI: This is a 51 year old gentleman with known history of alcohol abuse and chronic pancreatitis-recurrent. Tonight he was drinking beers and vodka when he again developed nausea, vomiting and generalized abdominal pain. Abdominal pain was constant and severe, greatest in the epigastric region. The pain wrapped band like bilaterally around abdomen. Pain was persisted, he came to the ER. He states there was no blood in his emesis but it was dark-colored. He denies any history of liver cirrhosis or variceal bleeding. In the ER patient remains in pain. The hospitalist service was called and requested to admit. History provided by the patient. Patient did report some diarrhea with the onset of his nausea and vomiting.  Review of Systems: Positives bolded  The patient denies anorexia, fever, weight loss, vision loss, decreased hearing, hoarseness, chest pain, syncope, dyspnea on exertion, peripheral edema, balance deficits, hemoptysis, abdominal pain, melena, hematochezia, severe indigestion/heartburn, hematuria, incontinence, genital sores, muscle weakness, suspicious skin lesions, transient blindness, difficulty walking, depression, unusual weight change, abnormal bleeding, enlarged lymph nodes, angioedema, and breast masses.  Past Medical History: Past Medical History  Diagnosis Date  . Diabetes mellitus   . Hypertension   . Seizures   . Pancreatitis   . Alcohol abuse   . Chronic abdominal pain   . Alcoholism /alcohol abuse    Past Surgical History  Procedure Date  . Appendectomy     Medications: Prior to Admission medications   Medication Sig Start Date End Date Taking? Authorizing Provider  acetaminophen (TYLENOL) 500 MG tablet Take 1,000 mg by mouth every 6 (six) hours as needed. For pain   Yes Historical Provider, MD  amLODipine (NORVASC) 5 MG tablet Take 1 tablet (5 mg total) by mouth daily.  03/18/11 03/17/12 Yes Marianne L York, PA  amylase-lipase-protease (PANGESTYME CN-20) 66.05-27-73 MU per capsule Take 1 capsule by mouth 3 (three) times daily with meals. Usually eats 2 large meals per day   Yes Historical Provider, MD  folic acid (FOLVITE) 1 MG tablet Take 1 tablet (1 mg total) by mouth daily. 03/18/11 03/17/12 Yes Marianne L York, PA  gabapentin (NEURONTIN) 300 MG capsule Take 300 mg by mouth 3 (three) times daily.     Yes Historical Provider, MD  insulin aspart (NOVOLOG) 100 UNIT/ML injection Inject 14-18 Units into the skin 3 (three) times daily before meals. Based on sliding scale   Yes Historical Provider, MD  Insulin Glargine (LANTUS [place]) Inject 40 Units into the skin at bedtime.    Yes Historical Provider, MD  Multiple Vitamin (MULITIVITAMIN WITH MINERALS) TABS Take 1 tablet by mouth daily. 03/18/11  Yes Tora Kindred York, PA  omeprazole (PRILOSEC) 20 MG capsule Take 1 capsule (20 mg total) by mouth daily. 12/14/10 12/14/11 Yes Renne Crigler, PA  oxyCODONE-acetaminophen (PERCOCET) 5-325 MG per tablet Take 1-2 tablets by mouth every 4 (four) hours as needed. pain    Yes Historical Provider, MD  pantoprazole (PROTONIX) 40 MG tablet Take 40 mg by mouth daily.     Yes Historical Provider, MD  phenytoin (DILANTIN) 100 MG ER capsule Take 200 mg by mouth at bedtime.     Yes Historical Provider, MD  thiamine 100 MG tablet Take 1 tablet (100 mg total) by mouth daily. 03/18/11 03/17/12 Yes Tora Kindred York, PA    Allergies:   Allergies  Allergen Reactions  . Morphine And Related Itching    itching    Social History:  reports that he quit smoking about 26 years ago. His smoking use included Cigarettes. He does not have any smokeless tobacco history on file. He reports that he drinks alcohol. He reports that he does not use illicit drugs.  Family History: Family History  Problem Relation Age of Onset  . Hypertension      Physical Exam: Filed Vitals:   06/04/11 0017 06/04/11 0329  BP: 129/90  134/94  Pulse: 120 128  Temp: 98 F (36.7 C)   TempSrc: Oral   Resp: 21 18  SpO2: 100% 98%    General:  Alert and oriented times three, well developed and nourished, ill-appearing Eyes: PERRLA, pink conjunctiva, no scleral icterus ENT: Dry oral mucosa, neck supple, no thyromegaly Lungs: clear to ascultation, no wheeze, no crackles, no use of accessory muscles Cardiovascular: regular rate and rhythm, no regurgitation, no gallops, no murmurs. No carotid bruits, no JVD Abdomen: soft, positive BS, generalized tenderness to palpation, mildly-distended abdomen, no organomegaly, not an acute abdomen GU: not examined Neuro: CN II - XII grossly intact, sensation intact Musculoskeletal: strength 5/5 all extremities, no clubbing, cyanosis or edema Skin: no rash, no subcutaneous crepitation, no decubitus    Labs on Admission:   [facility] 06/04/11 0205  NA 132*  K 3.7  CL 90*  CO2 26  GLUCOSE 300*  BUN 10  CREATININE 0.81  CALCIUM 9.4  MG --  PHOS --    Basename 06/04/11 0205  AST 27  ALT 23  ALKPHOS 109  BILITOT 0.2*  PROT 8.0  ALBUMIN 3.7    Basename 06/04/11 0205  LIPASE 9*  AMYLASE --    Basename 06/04/11 0205  WBC 6.7  NEUTROABS --  HGB 15.5  HCT 41.9  MCV 80.7  PLT 263   No results found for this basename: CKTOTAL:3,CKMB:3,CKMBINDEX:3,TROPONINI:3 in the last 72 hours No components found with this basename: POCBNP:3 No results found for this basename: DDIMER:2 in the last 72 hours No results found for this basename: HGBA1C:2 in the last 72 hours No results found for this basename: CHOL:2,HDL:2,LDLCALC:2,TRIG:2,CHOLHDL:2,LDLDIRECT:2 in the last 72 hours No results found for this basename: TSH,T4TOTAL,FREET3,T3FREE,THYROIDAB in the last 72 hours No results found for this basename: VITAMINB12:2,FOLATE:2,FERRITIN:2,TIBC:2,IRON:2,RETICCTPCT:2 in the last 72 hours  Micro Results: No results found for this or any previous visit (from the past 240 hour(s)). Results  for ORIEN, MAYHALL (MRN [id number]) as of 06/04/2011 05:00  Ref. Range 06/04/2011 03:29  Color, Urine Latest Range: YELLOW  YELLOW  APPearance Latest Range: CLEAR  CLEAR  Specific Gravity, Urine Latest Range: 1.005-1.030  1.042 (H)  pH Latest Range: 5.0-8.0  6.0  Glucose, UA Latest Range: NEGATIVE mg/dL >7829 (A)  Bilirubin Urine Latest Range: NEGATIVE  NEGATIVE  Ketones, ur Latest Range: NEGATIVE mg/dL 15 (A)  Protein Latest Range: NEGATIVE mg/dL 30 (A)  Urobilinogen, UA Latest Range: 0.0-1.0 mg/dL 1.0  Nitrite Latest Range: NEGATIVE  NEGATIVE  Leukocytes, UA Latest Range: NEGATIVE  NEGATIVE  Hgb urine dipstick Latest Range: NEGATIVE  NEGATIVE  WBC, UA Latest Range: <3 WBC/hpf 0-2  Squamous Epithelial / LPF Latest Range: RARE  RARE    Radiological Exams on Admission: No results found.  Assessment/Plan Present on Admission:  . acute on chronic pancreatitis  .ALCOHOL ABUSE  admit to telemetry N.p.o., IV fluid hydration Pain medication as needed Repeat lipase not ordered as it is normal Will order a KUB Protonix ordered  CIWA protocol  .DIABETES MELLITUS, TYPE II, ON INSULIN .HYPERLIPIDEMIA, MIXED .HYPERTENSION, BENIGN ESSENTIAL .SEIZURE DISORDER--per patient  due to alcohol withdrawal   stable home medications resumed  Full code DVT prophylaxis Team 2/Dr. Nadara Mustard, Ira Dougher 06/04/2011, 5:18 AM

## 2011-06-04 NOTE — ED Notes (Addendum)
Pt states unable to void at this time. Urinal left at bedside for patient.

## 2011-06-04 NOTE — ED Notes (Signed)
PT. ARRIVED WITH EMS FROM STREET , REPORTS VOMITTING AFTER DRINKING ETOH TODAY  , GENERALIZED ABDOMINAL PAIN , DENIES DIARRHEA , NO FEVER OR CHILLS.

## 2011-06-04 NOTE — Progress Notes (Signed)
Saw and examined Mr Shawgo at bed side. Reviewed his chart. Says he feels a little better. I have increased his IVF, ordered EKG,lipdis panel, otherwise continue orders per Dr Joneen Roach.  Sharese Manrique,MD pager#[phone].

## 2011-06-05 LAB — PHOSPHORUS: Phosphorus: 3.9 mg/dL (ref 2.3–4.6)

## 2011-06-05 LAB — COMPREHENSIVE METABOLIC PANEL
ALT: 21 U/L (ref 0–53)
AST: 24 U/L (ref 0–37)
CO2: 24 mEq/L (ref 19–32)
Calcium: 9 mg/dL (ref 8.4–10.5)
Creatinine, Ser: 0.71 mg/dL (ref 0.50–1.35)
GFR calc Af Amer: >90 mL/min (ref >90)
GFR calc non Af Amer: >90 mL/min (ref >90)
Glucose, Bld: 89 mg/dL (ref 70–99)
Sodium: 138 mEq/L (ref 135–145)
Total Protein: 6.6 g/dL (ref 6.0–8.3)

## 2011-06-05 LAB — AMYLASE: Amylase: 36 U/L (ref 0–105)

## 2011-06-05 LAB — CBC
MCH: 29.2 pg (ref 26.0–34.0)
MCHC: 35.3 g/dL (ref 30.0–36.0)
MCV: 82.6 fL (ref 78.0–100.0)
Platelets: 194 10*3/uL (ref 150–400)
RBC: 4.49 MIL/uL (ref 4.22–5.81)

## 2011-06-05 LAB — GLUCOSE, CAPILLARY
Glucose-Capillary: 90 mg/dL (ref 70–99)
Glucose-Capillary: 162 mg/dL — ABNORMAL HIGH (ref 70–99)

## 2011-06-05 LAB — LIPASE, BLOOD: Lipase: 7 U/L — ABNORMAL LOW (ref 11–59)

## 2011-06-05 MED ORDER — MAGNESIUM SULFATE 40 MG/ML IJ SOLN
2.0000 g | Freq: Once | INTRAMUSCULAR | Status: AC
  Administered 2011-06-05: 2 g via INTRAVENOUS
  Filled 2011-06-05: qty 50

## 2011-06-05 MED ORDER — SIMVASTATIN 20 MG PO TABS
20.0000 mg | ORAL_TABLET | Freq: Every day | ORAL | Status: DC
  Administered 2011-06-05 – 2011-06-07 (×3): 20 mg via ORAL
  Filled 2011-06-05 (×3): qty 1

## 2011-06-05 MED ORDER — INSULIN ASPART 100 UNIT/ML ~~LOC~~ SOLN
0.0000 [IU] | Freq: Three times a day (TID) | SUBCUTANEOUS | Status: DC
  Administered 2011-06-05 – 2011-06-06 (×2): 3 [IU] via SUBCUTANEOUS
  Administered 2011-06-06: 2 [IU] via SUBCUTANEOUS
  Administered 2011-06-07 (×3): 5 [IU] via SUBCUTANEOUS

## 2011-06-05 MED ORDER — FENOFIBRATE 160 MG PO TABS
160.0000 mg | ORAL_TABLET | Freq: Every day | ORAL | Status: DC
  Administered 2011-06-05 – 2011-06-07 (×3): 160 mg via ORAL
  Filled 2011-06-05 (×5): qty 1

## 2011-06-05 NOTE — Plan of Care (Signed)
Problem: Phase II Progression Outcomes Goal: Other Phase II Outcomes/Goals Education note given on Pancreatitis

## 2011-06-05 NOTE — Progress Notes (Signed)
SUBJECTIVE Gabriel Rush says he is hungry. He has less pain per abdomen.   1. Pancreatitis, alcoholic, acute   2. Abdominal pain   3. Tachycardia   4. Lactic acidosis     Past Medical History  Diagnosis Date  . Diabetes mellitus   . Hypertension   . Seizures   . Pancreatitis   . Alcohol abuse   . Chronic abdominal pain   . Alcoholism /alcohol abuse    Current Facility-Administered Medications  Medication Dose Route Frequency Provider Last Rate Last Dose  . 0.9 % NaCl with KCl 20 mEq/ L  infusion   Intravenous Continuous Neyla Gauntt, MD 150 mL/hr at 06/05/11 0608    . antiseptic oral rinse (BIOTENE) solution 15 mL  15 mL Mouth Rinse q12n4p Leroy Sea, MD   15 mL at 06/04/11 1810  . chlorhexidine (PERIDEX) 0.12 % solution 15 mL  15 mL Mouth Rinse BID Leroy Sea, MD   15 mL at 06/05/11 0853  . enoxaparin (LOVENOX) injection 40 mg  40 mg Subcutaneous Q24H Debby Crosley, MD   40 mg at 06/04/11 2255  . folic acid (FOLVITE) tablet 1 mg  1 mg Oral Daily Debby Crosley, MD   1 mg at 06/05/11 1038  . gabapentin (NEURONTIN) capsule 300 mg  300 mg Oral TID Gery Pray, MD   300 mg at 06/05/11 1038  . HYDROmorphone (DILAUDID) injection 1 mg  1 mg Intravenous Q4H PRN Debby Crosley, MD   1 mg at 06/05/11 0745  . insulin aspart (novoLOG) injection 0-15 Units  0-15 Units Subcutaneous Q6H Debby Crosley, MD   2 Units at 06/04/11 1226  . insulin glargine (LANTUS) injection 30 Units  30 Units Subcutaneous Daily Debby Crosley, MD   30 Units at 06/05/11 1038  . lipase/protease/amylase (CREON-10/PANCREASE) capsule 1 capsule  1 capsule Oral TID AC Gery Pray, MD   1 capsule at 06/05/11 0853  . LORazepam (ATIVAN) injection 0-4 mg  0-4 mg Intravenous Q6H Debby Crosley, MD       Followed by  . LORazepam (ATIVAN) injection 0-4 mg  0-4 mg Intravenous Q12H Debby Crosley, MD      . LORazepam (ATIVAN) tablet 1 mg  1 mg Oral Q6H PRN Gery Pray, MD       Or  . LORazepam (ATIVAN) injection 1 mg   1 mg Intravenous Q6H PRN Debby Crosley, MD      . magnesium sulfate IVPB 2 g 50 mL  2 g Intravenous Once Shaakira Borrero, MD      . mulitivitamin with minerals tablet 1 tablet  1 tablet Oral Daily Debby Crosley, MD   1 tablet at 06/05/11 1038  . ondansetron (ZOFRAN) tablet 4 mg  4 mg Oral Q6H PRN Debby Crosley, MD       Or  . ondansetron (ZOFRAN) injection 4 mg  4 mg Intravenous Q6H PRN Debby Crosley, MD      . pantoprazole (PROTONIX) EC tablet 40 mg  40 mg Oral Q1200 Debby Crosley, MD   40 mg at 06/04/11 1225  . phenytoin (DILANTIN) ER capsule 200 mg  200 mg Oral QHS Debby Crosley, MD      . sodium chloride 0.9 % injection 3 mL  3 mL Intravenous Q12H Debby Crosley, MD   3 mL at 06/05/11 1038  . thiamine (VITAMIN B-1) tablet 100 mg  100 mg Oral Daily Debby Crosley, MD   100 mg at 06/05/11 1038   Or  . thiamine (B-1) injection  100 mg  100 mg Intravenous Daily Gery Pray, MD       Allergies  Allergen Reactions  . Morphine And Related Itching    itching   Principal Problem:  *Pancreatitis, alcoholic, acute Active Problems:  DIABETES MELLITUS, TYPE II, ON INSULIN  HYPERLIPIDEMIA, MIXED  ALCOHOL ABUSE  HYPERTENSION, BENIGN ESSENTIAL  SEIZURE DISORDER  Abdominal pain  Chronic pancreatitis   Vital signs in last 24 hours: Temp:  [97.6 F (36.4 C)-97.8 F (36.6 C)] 97.6 F (36.4 C) (04/28 0600) Pulse Rate:  [74-107] 74  (04/28 0616) Resp:  [19-20] 19  (04/28 0600) BP: (140-153)/(97-106) 151/106 mmHg (04/28 0616) SpO2:  [95 %-97 %] 97 % (04/28 0600) Weight change:  Last BM Date: 06/03/11  Intake/Output from previous day: 04/27 0701 - 04/28 0700 In: 3031.3 [I.V.:3031.3] Out: 900 [Urine:900] Intake/Output this shift:    Lab Results:  Basename 06/05/11 0700 06/04/11 0816  WBC 4.4 5.7  HGB 13.1 13.3  HCT 37.1* 37.5*  PLT 194 222   BMET  Basename 06/05/11 0700 06/04/11 0816 06/04/11 0205  NA 138 -- 132*  K 3.7 -- 3.7  CL 104 -- 90*  CO2 24 -- 26  GLUCOSE 89 -- 300*    BUN 8 -- 10  CREATININE 0.71 0.77 --  CALCIUM 9.0 -- 9.4    Studies/Results: Dg Abd 1 View  06/04/2011  *RADIOLOGY REPORT*  Clinical Data: Upper abdominal pain  ABDOMEN - 1 VIEW  Comparison: 05/20/2011  Findings: No bowel obstruction.  Moderate stool burden.  Relative paucity of small bowel gas, similar to prior.  Cannot exclude free intraperitoneal air on the supine view.  Cannot exclude lung base opacities.  No acute osseous abnormality.  IMPRESSION: Nonobstructive bowel gas pattern.  Original Report Authenticated By: Waneta Martins, M.D.    Medications: I have reviewed the patient's current medications.   Physical exam GENERAL- alert HEAD- normal atraumatic, no neck masses, normal thyroid, no jvd RESPIRATORY- appears well, vitals normal, no respiratory distress, acyanotic, normal RR, ear and throat exam is normal, neck free of mass or lymphadenopathy, chest clear, no wheezing, crepitations, rhonchi, normal symmetric air entry CVS- regular rate and rhythm, S1, S2 normal, no murmur, click, rub or gallop ABDOMEN- abdomen is soft without significant tenderness, masses, organomegaly or guarding NEURO- Grossly normal EXTREMITIES- extremities normal, atraumatic, no cyanosis or edema  Plan   *Pancreatitis, alcoholic, acute- seems better, hungry. Will start feeds, see if he will tolerate. No evidence of etoh withdrawal. Chronic medical problems:  DIABETES MELLITUS, TYPE II, ON INSULIN- controlled.  HYPERLIPIDEMIA, MIXED- hyperglycemic element. Add statin, fenofibrate.  HYPERTENSION, BENIGN ESSENTIAL- controlled.  SEIZURE DISORDER- controlled.  Chronic pancreatitis with flare up- as above. Dvt/gi prophylaxis    Saarah Dewing 06/05/2011 11:08 AM Pager: [phone].

## 2011-06-06 LAB — COMPREHENSIVE METABOLIC PANEL
BUN: 6 mg/dL (ref 6–23)
CO2: 23 mEq/L (ref 19–32)
Calcium: 9.5 mg/dL (ref 8.4–10.5)
Chloride: 99 mEq/L (ref 96–112)
Creatinine, Ser: 0.74 mg/dL (ref 0.50–1.35)
GFR calc Af Amer: >90 mL/min (ref >90)
GFR calc non Af Amer: >90 mL/min (ref >90)
Glucose, Bld: 106 mg/dL — ABNORMAL HIGH (ref 70–99)
Total Bilirubin: 0.4 mg/dL (ref 0.3–1.2)

## 2011-06-06 LAB — CBC
HCT: 40.7 % (ref 39.0–52.0)
Hemoglobin: 14.2 g/dL (ref 13.0–17.0)
MCH: 29.2 pg (ref 26.0–34.0)
MCV: 83.6 fL (ref 78.0–100.0)
Platelets: 213 10*3/uL (ref 150–400)
RBC: 4.87 MIL/uL (ref 4.22–5.81)
WBC: 5.1 10*3/uL (ref 4.0–10.5)

## 2011-06-06 LAB — GLUCOSE, CAPILLARY
Glucose-Capillary: 132 mg/dL — ABNORMAL HIGH (ref 70–99)
Glucose-Capillary: 190 mg/dL — ABNORMAL HIGH (ref 70–99)

## 2011-06-06 LAB — LIPASE, BLOOD: Lipase: 7 U/L — ABNORMAL LOW (ref 11–59)

## 2011-06-06 MED ORDER — AMLODIPINE BESYLATE 10 MG PO TABS
10.0000 mg | ORAL_TABLET | Freq: Every day | ORAL | Status: DC
  Administered 2011-06-06 – 2011-06-07 (×2): 10 mg via ORAL
  Filled 2011-06-06 (×2): qty 1

## 2011-06-06 NOTE — Progress Notes (Addendum)
SUBJECTIVE Feels better. Pain better. No nausea.   1. Pancreatitis, alcoholic, acute   2. Abdominal pain   3. Tachycardia   4. Lactic acidosis     Past Medical History  Diagnosis Date  . Diabetes mellitus   . Hypertension   . Seizures   . Pancreatitis   . Alcohol abuse   . Chronic abdominal pain   . Alcoholism /alcohol abuse    Current Facility-Administered Medications  Medication Dose Route Frequency Provider Last Rate Last Dose  . 0.9 % NaCl with KCl 20 mEq/ L  infusion   Intravenous Continuous Jeana Kersting, MD 75 mL/hr at 06/05/11 1237    . antiseptic oral rinse (BIOTENE) solution 15 mL  15 mL Mouth Rinse q12n4p Leroy Sea, MD   15 mL at 06/06/11 1301  . chlorhexidine (PERIDEX) 0.12 % solution 15 mL  15 mL Mouth Rinse BID Leroy Sea, MD   15 mL at 06/06/11 0846  . enoxaparin (LOVENOX) injection 40 mg  40 mg Subcutaneous Q24H Debby Crosley, MD   40 mg at 06/05/11 2102  . fenofibrate tablet 160 mg  160 mg Oral Q breakfast Congetta Odriscoll, MD   160 mg at 06/06/11 0846  . folic acid (FOLVITE) tablet 1 mg  1 mg Oral Daily Debby Crosley, MD   1 mg at 06/06/11 0901  . gabapentin (NEURONTIN) capsule 300 mg  300 mg Oral TID Gery Pray, MD   300 mg at 06/06/11 0901  . HYDROmorphone (DILAUDID) injection 1 mg  1 mg Intravenous Q4H PRN Debby Crosley, MD   1 mg at 06/06/11 1427  . insulin aspart (novoLOG) injection 0-15 Units  0-15 Units Subcutaneous TID WC Benji Poynter, MD   2 Units at 06/06/11 1301  . insulin glargine (LANTUS) injection 30 Units  30 Units Subcutaneous Daily Gery Pray, MD   30 Units at 06/06/11 0903  . lipase/protease/amylase (CREON-10/PANCREASE) capsule 1 capsule  1 capsule Oral TID AC Debby Crosley, MD   1 capsule at 06/06/11 1304  . LORazepam (ATIVAN) injection 0-4 mg  0-4 mg Intravenous Q6H Debby Crosley, MD       Followed by  . LORazepam (ATIVAN) injection 0-4 mg  0-4 mg Intravenous Q12H Debby Crosley, MD      . LORazepam (ATIVAN) tablet 1 mg  1 mg  Oral Q6H PRN Gery Pray, MD       Or  . LORazepam (ATIVAN) injection 1 mg  1 mg Intravenous Q6H PRN Debby Crosley, MD      . mulitivitamin with minerals tablet 1 tablet  1 tablet Oral Daily Gery Pray, MD   1 tablet at 06/06/11 0902  . ondansetron (ZOFRAN) tablet 4 mg  4 mg Oral Q6H PRN Debby Crosley, MD       Or  . ondansetron (ZOFRAN) injection 4 mg  4 mg Intravenous Q6H PRN Debby Crosley, MD      . pantoprazole (PROTONIX) EC tablet 40 mg  40 mg Oral Q1200 Debby Crosley, MD   40 mg at 06/06/11 1304  . phenytoin (DILANTIN) ER capsule 200 mg  200 mg Oral QHS Debby Crosley, MD   200 mg at 06/05/11 2102  . simvastatin (ZOCOR) tablet 20 mg  20 mg Oral q1800 Xaivier Malay, MD   20 mg at 06/05/11 1707  . sodium chloride 0.9 % injection 3 mL  3 mL Intravenous Q12H Debby Crosley, MD   3 mL at 06/05/11 1038  . thiamine (VITAMIN B-1) tablet 100 mg  100  mg Oral Daily Debby Crosley, MD   100 mg at 06/06/11 0902   Or  . thiamine (B-1) injection 100 mg  100 mg Intravenous Daily Gery Pray, MD       Allergies  Allergen Reactions  . Morphine And Related Itching    itching   Principal Problem:  *Pancreatitis, alcoholic, acute Active Problems:  DIABETES MELLITUS, TYPE II, ON INSULIN  HYPERLIPIDEMIA, MIXED  ALCOHOL ABUSE  HYPERTENSION, BENIGN ESSENTIAL  SEIZURE DISORDER  Abdominal pain  Chronic pancreatitis   Vital signs in last 24 hours: Temp:  [98 F (36.7 C)-98.1 F (36.7 C)] 98 F (36.7 C) (04/29 0600) Pulse Rate:  [77-98] 77  (04/29 0600) Resp:  [18] 18  (04/29 0600) BP: (152-160)/(96-102) 152/96 mmHg (04/29 0600) SpO2:  [93 %-98 %] 93 % (04/29 0600) Weight change:  Last BM Date: 06/05/11  Intake/Output from previous day: 04/28 0701 - 04/29 0700 In: 3092.5 [P.O.:640; I.V.:2402.5; IV Piggyback:50] Out: 1000 [Urine:1000] Intake/Output this shift: Total I/O In: 240 [P.O.:240] Out: 850 [Urine:850]  Lab Results:  Basename 06/06/11 0642 06/05/11 0700  WBC 5.1 4.4  HGB  14.2 13.1  HCT 40.7 37.1*  PLT 213 194   BMET  Basename 06/06/11 0642 06/05/11 0700  NA 136 138  K 3.8 3.7  CL 99 104  CO2 23 24  GLUCOSE 106* 89  BUN 6 8  CREATININE 0.74 0.71  CALCIUM 9.5 9.0    Studies/Results: No results found.  Medications: I have reviewed the patient's current medications.   Physical exam GENERAL- alert HEAD- normal atraumatic, no neck masses, normal thyroid, no jvd RESPIRATORY- appears well, vitals normal, no respiratory distress, acyanotic, normal RR, ear and throat exam is normal, neck free of mass or lymphadenopathy, chest clear, no wheezing, crepitations, rhonchi, normal symmetric air entry CVS- regular rate and rhythm, S1, S2 normal, no murmur, click, rub or gallop ABDOMEN- abdomen is soft without significant tenderness, masses, organomegaly or guarding NEURO- Grossly normal EXTREMITIES- extremities normal, atraumatic, no cyanosis or edema  Plan  *Pancreatitis, alcoholic, acute- seems better. Tolerating feeds. No evidence of etoh withdrawal.  Chronic medical problems:  DIABETES MELLITUS, TYPE II, ON INSULIN- controlled.  HYPERLIPIDEMIA, MIXED- hyperglycemic element. Add statin, fenofibrate.  HYPERTENSION, BENIGN ESSENTIAL- fluctuating control, resume norvasc.  SEIZURE DISORDER- controlled.  Chronic pancreatitis with flare up- as above.  Dvt/gi prophylaxis     Oather Muilenburg 06/06/2011 2:55 PM Pager: [phone].

## 2011-06-06 NOTE — Progress Notes (Signed)
Utilization review completed. Avagrace Botelho RN  

## 2011-06-07 LAB — GLUCOSE, CAPILLARY
Glucose-Capillary: 234 mg/dL — ABNORMAL HIGH (ref 70–99)
Glucose-Capillary: 219 mg/dL — ABNORMAL HIGH (ref 70–99)

## 2011-06-07 MED ORDER — FENOFIBRATE 160 MG PO TABS: 160.0000 mg | ORAL_TABLET | Freq: Every day | ORAL | Status: DC

## 2011-06-07 MED ORDER — SIMVASTATIN 20 MG PO TABS: 20.0000 mg | ORAL_TABLET | Freq: Every day | ORAL | Status: DC

## 2011-06-07 MED ORDER — AMLODIPINE BESYLATE 5 MG PO TABS: 10.0000 mg | ORAL_TABLET | Freq: Every day | ORAL | Status: DC

## 2011-06-07 MED ORDER — OXYCODONE-ACETAMINOPHEN 5-325 MG PO TABS: 1.0000 | ORAL_TABLET | ORAL | Status: DC | PRN

## 2011-06-07 NOTE — Progress Notes (Signed)
Inpatient Diabetes Program Recommendations  AACE/ADA: New Consensus Statement on Inpatient Glycemic Control (2009)  Target Ranges:  Prepandial:   less than 140 mg/dL      Peak postprandial:   less than 180 mg/dL (1-2 hours)      Critically ill patients:  140 - 180 mg/dL   Reason for Visit: Hyperglycemia  Inpatient Diabetes Program Recommendations Insulin - Basal: Home basal insulin dose is Lantus 40 units.  Currently getting Lantus 30 units.  May benefit from increasing Lantus to home dose. Insulin - Meal Coverage: Per Med Rec patient takes 14 to 18 units Novolog tid with meals.  Patient may benefit from adding 8 units meal coverage tid with meals in addition to correction. HgbA1C: Last known Hgb A1c was 12.3 11/2011.  Request update.  Note:  Results for RAYNER, ERMAN (MRN [id number]) as of 06/07/2011 12:20  Ref. Range 06/06/2011 17:44 06/07/2011 00:17 06/07/2011 07:28 06/07/2011 11:56  Glucose-Capillary Latest Range: 70-99 mg/dL 578 (H) 469 (H) 629 (H) 219 (H)   Thank you. Flecia Shutter S. Elsie Lincoln, RN, CNS, CDE  [phone])

## 2011-06-07 NOTE — Discharge Summary (Signed)
DISCHARGE SUMMARY  Champion Corales  MR#: [id number]  DOB:Dec 23, 1960  Date of Admission: 06/04/2011 Date of Discharge: 06/07/2011  Attending Physician:Zonnique Norkus  Patient's NWG:NFAOZHYQ,MVHQIONGE, MD, MD  Consults: none.  Discharge Diagnoses: Present on Admission:  .Pancreatitis, alcoholic, acute .ALCOHOL ABUSE .Chronic pancreatitis .Abdominal pain .DIABETES MELLITUS, TYPE II, ON INSULIN .HYPERLIPIDEMIA, MIXED .HYPERTENSION, BENIGN ESSENTIAL .SEIZURE DISORDER  Hospital Course: Mr Podolak came in with acute abdominal pain, nausea and vomiting, typical of prior episodes of acute on chronic pancreatitis. This time he had an alcohol binge, including vodka, day preceding admission. He had bowel rest for a day, and started feeling hungry, at which point feeding was initiated. He has continued to feel better and is tolerating feeding. Amylase/lipase persistently normal- maybe not much left. I counseled him on need to quit, on a daily basis. He is close to his baseline and eager to go home. Will send home on a few days of percocet.   Medication List  As of 06/07/2011  6:16 PM   STOP taking these medications         acetaminophen 500 MG tablet      pantoprazole 40 MG tablet         TAKE these medications         amLODipine 5 MG tablet   Commonly known as: NORVASC   Take 2 tablets (10 mg total) by mouth daily.      amylase-lipase-protease 66.05-27-73 MU per capsule   Commonly known as: PANGESTYME CN-20   Take 1 capsule by mouth 3 (three) times daily with meals. Usually eats 2 large meals per day      fenofibrate 160 MG tablet   Take 1 tablet (160 mg total) by mouth daily with breakfast.      folic acid 1 MG tablet   Commonly known as: FOLVITE   Take 1 tablet (1 mg total) by mouth daily.      gabapentin 300 MG capsule   Commonly known as: NEURONTIN   Take 300 mg by mouth 3 (three) times daily.      insulin aspart 100 UNIT/ML injection   Commonly known as: novoLOG   Inject 14-18 Units into the skin 3 (three) times daily before meals. Based on sliding scale      LANTUS Henefer   Inject 40 Units into the skin at bedtime.      mulitivitamin with minerals Tabs   Take 1 tablet by mouth daily.      omeprazole 20 MG capsule   Commonly known as: PRILOSEC   Take 1 capsule (20 mg total) by mouth daily.      oxyCODONE-acetaminophen 5-325 MG per tablet   Commonly known as: PERCOCET   Take 1-2 tablets by mouth every 4 (four) hours as needed. pain      phenytoin 100 MG ER capsule   Commonly known as: DILANTIN   Take 200 mg by mouth at bedtime.      simvastatin 20 MG tablet   Commonly known as: ZOCOR   Take 1 tablet (20 mg total) by mouth daily at 6 PM.      thiamine 100 MG tablet   Take 1 tablet (100 mg total) by mouth daily.             Day of Discharge BP 144/78  Pulse 84  Temp(Src) 98.2 F (36.8 C) (Oral)  Resp 18  Ht 5\' 11"  (1.803 m)  Wt 99.1 kg (218 lb 7.6 oz)  BMI 30.47 kg/m2  SpO2 95%  Physical  Exam: No abdominal tenderness.  Results for orders placed during the hospital encounter of 06/04/11 (from the past 24 hour(s))  GLUCOSE, CAPILLARY     Status: Abnormal   Collection Time   06/07/11 12:17 AM      Component Value Range   Glucose-Capillary 258 (*) 70 - 99 (mg/dL)   Comment 1 Notify RN     Comment 2 Documented in Chart    GLUCOSE, CAPILLARY     Status: Abnormal   Collection Time   06/07/11  7:28 AM      Component Value Range   Glucose-Capillary 234 (*) 70 - 99 (mg/dL)   Comment 1 Notify RN     Comment 2 Documented in Chart    GLUCOSE, CAPILLARY     Status: Abnormal   Collection Time   06/07/11 11:56 AM      Component Value Range   Glucose-Capillary 219 (*) 70 - 99 (mg/dL)  GLUCOSE, CAPILLARY     Status: Abnormal   Collection Time   06/07/11  5:17 PM      Component Value Range   Glucose-Capillary 207 (*) 70 - 99 (mg/dL)    Disposition: home today.   Follow-up Appts: Discharge Orders    Future Orders Please Complete  By Expires   Diet - low sodium heart healthy      Increase activity slowly         Follow-up Information    Follow up with Yisroel Ramming, MD on 06/21/2011. (AT 1015 AM [facility] FOLLOW UP))    Contact information:   [address] [place] [address] [phone]           Time spent in discharge (includes decision making & examination of pt): 15 minutes  Signed: Coila Wardell 06/07/2011, 6:16 PM

## 2011-06-07 NOTE — Progress Notes (Deleted)
Clinical Social Work Department BRIEF PSYCHOSOCIAL ASSESSMENT 06/07/2011  Patient:  Gabriel Rush,Gabriel Rush     Account Number:  [id number]     Admit date:  06/06/2011  Clinical Social Worker:  Bridger Pizzi, LCSW  Date/Time:  06/07/2011 09:30 AM  Referred by:  RN  Date Referred:  06/07/2011 Referred for  Other - See comment   Other Referral:   "I need help with my mom"-per patient   Interview type:  Patient Other interview type:   Mom present    PSYCHOSOCIAL DATA Living Status:  FAMILY Admitted from facility:   Level of care:   Primary support name:  Jeanette Primary support relationship to patient:  SPOUSE Degree of support available:   Adequate    CURRENT CONCERNS Current Concerns  Other - See comment   Other Concerns:   Problems with mom    SOCIAL WORK ASSESSMENT / PLAN CSW received referral from RN stating that patient needed help with his mom. CSW met with patient and mom at bedside. Patient reported that he was driving when he came to the hospital and his mom was with him. Patient reports he cares for his mom and does not have support from his siblings, wife or children at this time. Patient reports wife works, children are in school and siblings have not been involved. Patient reports his mom has been staying at the hospital with him because he is not comfortable with her being at home. Per MD, patient is dc today. CSW spoke with patient regarding his ability to transport himself and mom home and patient reports his car is at the hospital. Per RN, unit can assist with helping patient and mom to car at dc. If unit cannot help, CSW asked RN to call CSW for assistance. CSW is signing off but available if needed.   Assessment/plan status:  No Further Intervention Required Other assessment/ plan:   Information/referral to community resources:    PATIENT'S/FAMILY'S RESPONSE TO PLAN OF CARE: Patient reported he was overwhelmed and wanted to make sure his mom could stay and visit  with him. 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 Patient reports no further concerns.         

## 2011-06-07 NOTE — Discharge Instructions (Addendum)

## 2011-06-07 NOTE — Progress Notes (Signed)
Pt was given discharge instructions on his abdominal pain and when to seek medical care. Pt was also given teaching on his medications, the side effects of his medications, and the proper dose and time for which he should take his medications. Pt states that he understands the instructions. Pt's IV was removed and the pt's skin was unremarkable. The catheter was intact upon removal. Pt is to be discharged home.

## 2011-06-07 NOTE — Progress Notes (Signed)
Clinical Social Work Department BRIEF PSYCHOSOCIAL ASSESSMENT 06/07/2011  Patient:  Gabriel Rush, Gabriel Rush     Account Number:  [id number]     Admit date:  06/04/2011  Clinical Social Worker:  Dennison Bulla  Date/Time:  06/07/2011 10:00 AM  Referred by:  Physician  Date Referred:  06/07/2011 Referred for  Substance Abuse   Other Referral:   Interview type:  Patient Other interview type:    PSYCHOSOCIAL DATA Living Status:  FAMILY Admitted from facility:   Level of care:   Primary support name:  Gabriel Rush Primary support relationship to patient:  PARENT Degree of support available:   Adequate    CURRENT CONCERNS Current Concerns  Substance Abuse   Other Concerns:    SOCIAL WORK ASSESSMENT / PLAN CSW received referral for patient for substance abuse. CSW reviewed chart which stated patient's BAL was 214 on admission. CSW met with patient at bedside. Per patient's report, he drinks about once a month when he is with his friends. Patient reported he enjoys drinking when socializing but reports this drinking is not related to stress, depression, etc. Patient reports he prefers to drink liquor when he is with his friends. Patient reports he does not have a problem with drinking and refused all resources. Patient showed good insight when recognizing that his drinking habits negatively affect his physical health. Patient reports he will try and drink less when with his friends and utlimately decided if he is unable to control his drinking he will not hang out with these friends anymore. Patient reported no further concerns. CSW completed SBIRT and offered resources but patient declined. CSW is signing off.   Assessment/plan status:  No Further Intervention Required Other assessment/ plan:   SBIRT   Information/referral to community resources:   Patient declined    PATIENT'S/FAMILY'S RESPONSE TO PLAN OF CARE: Patient was alert and oriented and sitting in bed. Patient was engaged during  assessment and had appropriate affect. Patient was open to discussing alcohol use and denied all other drug use. Patient politely declined resources.

## 2011-06-07 NOTE — Progress Notes (Signed)
1100 Dr. Venetia Constable made aware of elevated blood pressure will address on rounds.

## 2011-06-07 NOTE — Progress Notes (Signed)
   CARE MANAGEMENT NOTE 06/07/2011  Patient:  Gabriel Rush, Gabriel Rush   Account Number:  [id number]  Date Initiated:  06/06/2011  Documentation initiated by:  Darlyne Russian  Subjective/Objective Assessment:   Patient admitted with pancreatitis     Action/Plan:   Progression of care and discharge planning   Anticipated DC Date:  06/09/2011   Anticipated DC Plan:  HOME/SELF CARE      DC Planning Services  CM consult      Choice offered to / List presented to:             Status of service:  In process, will continue to follow Medicare Important Message given?   (If response is "NO", the following Medicare IM given date fields will be blank) Date Medicare IM given:   Date Additional Medicare IM given:    Discharge Disposition:    Per UR Regulation:  Reviewed for med. necessity/level of care/duration of stay  If discussed at Long Length of Stay Meetings, dates discussed:    Comments:  06/07/11 Gabriel Boer, RN, [name] [phone] PT WAS ADMITTED WITH PANCREATITIS AND HE HAS BEEN DRINKING. PT STATED THAT HE ONLY DRINKS EVERY NOW AND THEN AND THAT IT DONT TAKE ALOT TO FLARE UP HIS PANCREAS.  PT IS NOT INTERESTED IN ETOH COUNCILING.  PT IS FROM HOME [facility] HIS MOTHER AND WILL RETURN AT DC.  PT MAY DC TO HOME WITH SELF CARE TODAY. PT IS SEEN AT [facility] AND HAS AN APPT ON MAY 14TH AT 1015 WITH DR. VOLMER.  06/06/2011 1400 Darlyne Russian RN utilization review completed.

## 2011-06-07 NOTE — Progress Notes (Signed)
Pt j\had an elevated bp of 151/105 at 0506. BP was rechecked and now is 146/95 after pt was given 1 mg of ativan 0635. Dr. Was notified. Day shift nurse was told and will continue to monitor.

## 2011-06-09 ENCOUNTER — Encounter (HOSPITAL_COMMUNITY): Payer: Self-pay | Admitting: Adult Health

## 2011-06-09 ENCOUNTER — Emergency Department (HOSPITAL_COMMUNITY)
Admission: EM | Admit: 2011-06-09 | Discharge: 2011-06-09 | Disposition: A | Discharge status: Home / Self Care | Payer: Self-pay | Attending: Emergency Medicine | Admitting: Emergency Medicine

## 2011-06-09 DIAGNOSIS — R569 Unspecified convulsions: Secondary | ICD-10-CM | POA: Insufficient documentation

## 2011-06-09 DIAGNOSIS — I10 Essential (primary) hypertension: Secondary | ICD-10-CM | POA: Insufficient documentation

## 2011-06-09 DIAGNOSIS — F10929 Alcohol use, unspecified with intoxication, unspecified: Secondary | ICD-10-CM

## 2011-06-09 DIAGNOSIS — Z794 Long term (current) use of insulin: Secondary | ICD-10-CM | POA: Insufficient documentation

## 2011-06-09 DIAGNOSIS — F1010 Alcohol abuse, uncomplicated: Secondary | ICD-10-CM | POA: Insufficient documentation

## 2011-06-09 DIAGNOSIS — E119 Type 2 diabetes mellitus without complications: Secondary | ICD-10-CM | POA: Insufficient documentation

## 2011-06-09 DIAGNOSIS — R109 Unspecified abdominal pain: Secondary | ICD-10-CM | POA: Insufficient documentation

## 2011-06-09 LAB — POCT I-STAT, CHEM 8
Calcium, Ion: 1.17 mmol/L (ref 1.12–1.32)
Glucose, Bld: 259 mg/dL — ABNORMAL HIGH (ref 70–99)
HCT: 43 % (ref 39.0–52.0)
Hemoglobin: 14.6 g/dL (ref 13.0–17.0)
TCO2: 24 mmol/L (ref 0–100)

## 2011-06-09 LAB — URINALYSIS, ROUTINE W REFLEX MICROSCOPIC
Bilirubin Urine: NEGATIVE
Ketones, ur: NEGATIVE mg/dL
Leukocytes, UA: NEGATIVE
Nitrite: NEGATIVE
Protein, ur: NEGATIVE mg/dL
Urobilinogen, UA: 0.2 mg/dL (ref 0.0–1.0)
pH: 5.5 (ref 5.0–8.0)

## 2011-06-09 LAB — GLUCOSE, CAPILLARY: Glucose-Capillary: 235 mg/dL — ABNORMAL HIGH (ref 70–99)

## 2011-06-09 MED ORDER — ONDANSETRON 4 MG PO TBDP
8.0000 mg | ORAL_TABLET | Freq: Once | ORAL | Status: AC
  Administered 2011-06-09: 8 mg via ORAL
  Filled 2011-06-09: qty 2

## 2011-06-09 MED ORDER — ACETAMINOPHEN 325 MG PO TABS
650.0000 mg | ORAL_TABLET | Freq: Once | ORAL | Status: AC
  Administered 2011-06-09: 650 mg via ORAL
  Filled 2011-06-09: qty 2

## 2011-06-09 NOTE — Discharge Instructions (Signed)
  Take Tylenol for pain. Stop drinking alcohol.  Abdominal Pain Abdominal pain can be caused by many things. Your caregiver decides the seriousness of your pain by an examination and possibly blood tests and X-rays. Many cases can be observed and treated at home. Most abdominal pain is not caused by a disease and will probably improve without treatment. However, in many cases, more time must pass before a clear cause of the pain can be found. Before that point, it may not be known if you need more testing, or if hospitalization or surgery is needed. HOME CARE INSTRUCTIONS   Do not take laxatives unless directed by your caregiver.   Take pain medicine only as directed by your caregiver.   Only take over-the-counter or prescription medicines for pain, discomfort, or fever as directed by your caregiver.   Try a clear liquid diet (broth, tea, or water) for as long as directed by your caregiver. Slowly move to a bland diet as tolerated.  SEEK IMMEDIATE MEDICAL CARE IF:   The pain does not go away.   You have a fever.   You keep throwing up (vomiting).   The pain is felt only in portions of the abdomen. Pain in the right side could possibly be appendicitis. In an adult, pain in the left lower portion of the abdomen could be colitis or diverticulitis.   You pass bloody or black tarry stools.  MAKE SURE YOU:   Understand these instructions.   Will watch your condition.   Will get help right away if you are not doing well or get worse.  Document Released: 11/03/2004 Document Revised: 01/13/2011 Document Reviewed: 09/12/2007 [facility] Patient Information 2012 [place], [place].Alcohol Intoxication Alcohol intoxication means your blood alcohol level is above legal limits. Alcohol is a drug. It has serious side effects. These side effects can include:  Damage to your organs (liver, nervous system, and blood system).   Unclear thinking.   Slowed reflexes.   Decreased muscle coordination.    HOME CARE  Do not drink and drive.   Do not drink alcohol if you are taking medicine or using other drugs. Doing so can cause serious medical problems or even death.   Drink enough water and fluids to keep your pee (urine) clear or pale yellow.   Eat healthy foods.   Only take medicine as told by your doctor.   Join an alcohol support group.  GET HELP RIGHT AWAY IF:  You become shaky when you stop drinking.   Your thinking is unclear or you become confused.   You throw up (vomit) blood. It may look bright red or like coffee grounds.   You notice blood in your poop (bowel movements).   You become lightheaded or pass out (faint).  MAKE SURE YOU:   Understand these instructions.   Will watch your condition.   Will get help right away if you are not doing well or get worse.  Document Released: 07/13/2007 Document Revised: 01/13/2011 Document Reviewed: 07/13/2009 [facility] Patient Information 2012 [place], [place].

## 2011-06-09 NOTE — ED Notes (Signed)
Pt reoriented, updated, environment secured, CBIR, has just used urinal, urinal replaced to BS, IV line disconnected, changed to NSL. VSS. Instructed not to get up on his own and to use call bell, SR up x2, door open, successfully demonstrates use of call bell.

## 2011-06-09 NOTE — ED Provider Notes (Signed)
History     CSN: [id number]  Arrival date & time 06/09/11  0129   First MD Initiated Contact with Patient 06/09/11 0132      Chief Complaint  Patient presents with  . Abdominal Pain    (Consider location/radiation/quality/duration/timing/severity/associated sxs/prior treatment) HPI Comments: Gabriel Rush is a 51 y.o. Male who is here for abdominal pain after drinking vodka. He would like something for pain. He was admitted on 06/04/11 for pancreatitis, with tachycardia. He has no further complaints. He chose not to take his medicine today and drink instead.  Patient is a 51 y.o. male presenting with abdominal pain. The history is provided by the patient.  Abdominal Pain The primary symptoms of the illness include abdominal pain.    Past Medical History  Diagnosis Date  . Diabetes mellitus   . Hypertension   . Seizures   . Pancreatitis   . Alcohol abuse   . Chronic abdominal pain   . Alcoholism /alcohol abuse     Past Surgical History  Procedure Date  . Appendectomy     Family History  Problem Relation Age of Onset  . Hypertension      History  Substance Use Topics  . Smoking status: Former Smoker    Types: Cigarettes    Quit date: 12/21/1984  . Smokeless tobacco: Not on file  . Alcohol Use: Yes     heavy drinker: binge drinking per pt      Review of Systems  Gastrointestinal: Positive for abdominal pain.  All other systems reviewed and are negative.    Allergies  Morphine and related  Home Medications   Current Outpatient Rx  Name Route Sig Dispense Refill  . AMLODIPINE BESYLATE 5 MG PO TABS Oral Take 2 tablets (10 mg total) by mouth daily. 30 tablet 0  . AMYLASE-LIPASE-PROTEASE 66.05-27-73 MU PO CPEP Oral Take 1 capsule by mouth 3 (three) times daily with meals. Usually eats 2 large meals per day    . FENOFIBRATE 160 MG PO TABS Oral Take 1 tablet (160 mg total) by mouth daily with breakfast. 30 tablet 0  . FOLIC ACID 1 MG PO TABS Oral Take 1  tablet (1 mg total) by mouth daily.    Marland Kitchen GABAPENTIN 300 MG PO CAPS Oral Take 300 mg by mouth 3 (three) times daily.      . INSULIN ASPART 100 UNIT/ML [place] SOLN Subcutaneous Inject 14-18 Units into the skin 3 (three) times daily before meals. Based on sliding scale    . LANTUS  Subcutaneous Inject 40 Units into the skin at bedtime.     . ADULT MULTIVITAMIN W/MINERALS CH Oral Take 1 tablet by mouth daily.    Marland Kitchen OMEPRAZOLE 20 MG PO CPDR Oral Take 1 capsule (20 mg total) by mouth daily. 20 capsule 0  . OXYCODONE-ACETAMINOPHEN 5-325 MG PO TABS Oral Take 1-2 tablets by mouth every 4 (four) hours as needed. pain 20 tablet 0  . PHENYTOIN SODIUM EXTENDED 100 MG PO CAPS Oral Take 200 mg by mouth at bedtime.      Marland Kitchen SIMVASTATIN 20 MG PO TABS Oral Take 1 tablet (20 mg total) by mouth daily at 6 PM. 30 tablet 0  . THIAMINE HCL 100 MG PO TABS Oral Take 1 tablet (100 mg total) by mouth daily.      BP 107/77  Pulse 90  Temp(Src) 97.8 F (36.6 C) (Oral)  Resp 18  SpO2 99%  Physical Exam  Nursing note and vitals reviewed. Constitutional: He is  oriented to person, place, and time. He appears well-developed and well-nourished.  HENT:  Head: Normocephalic and atraumatic.  Right Ear: External ear normal.  Left Ear: External ear normal.  Eyes: Conjunctivae and EOM are normal. Pupils are equal, round, and reactive to light.  Neck: Normal range of motion and phonation normal. Neck supple.  Cardiovascular: Normal rate, regular rhythm, normal heart sounds and intact distal pulses.   Pulmonary/Chest: Effort normal and breath sounds normal. No respiratory distress. He has no rales. He exhibits no bony tenderness.  Abdominal: Soft. Normal appearance. He exhibits no mass. There is tenderness (mild). There is no guarding.  Musculoskeletal: Normal range of motion.  Neurological: He is alert and oriented to person, place, and time. He has normal strength. No cranial nerve deficit or sensory deficit. He exhibits normal  muscle tone. Coordination normal.  Skin: Skin is warm, dry and intact.  Psychiatric: He has a normal mood and affect. His behavior is normal. Judgment and thought content normal.    ED Course  Procedures (including critical care time)  Labs Reviewed  GLUCOSE, CAPILLARY - Abnormal; Notable for the following:    Glucose-Capillary 235 (*)    All other components within normal limits  URINALYSIS, ROUTINE W REFLEX MICROSCOPIC - Abnormal; Notable for the following:    Glucose, UA 500 (*)    All other components within normal limits  POCT I-STAT, CHEM 8 - Abnormal; Notable for the following:    Glucose, Bld 259 (*)    All other components within normal limits  LAB REPORT - SCANNED   No results found.   1. Alcohol intoxication   2. Abdominal pain       MDM  Alcohol intoxication, with recurrent abdominal pain. Doubt metabolic instability. Acute pancreatitis or occult infection. Patient is improved. It should emergency department in stable for discharge.    Plan: Home Medications- usual; Home Treatments- Stop drinking EtOH; Recommended follow up- f/u PCP prn       Flint Melter, MD 06/09/11 [id number]

## 2011-06-09 NOTE — ED Notes (Signed)
Hx of pancreatitis, drank approx. 1/2 gallon of alcohol today and smoked marijuana. C/o abdominal pain and vomitting. Abdomen distended, wound with yellow drainage noted to right neck. Smells of ETOH.

## 2011-06-09 NOTE — ED Notes (Signed)
Pt talking on phone, "speaking with ride, ride on their way here", states, "ready to go, wanting to leave". EDP aware, d/c instructions being prepared.

## 2011-06-09 NOTE — ED Notes (Signed)
Resting & sleeping, no changes, CBIR, urinal and glasses in reach.

## 2011-06-09 NOTE — ED Notes (Signed)
Sleeping/resting, arousable to voice, interactive, NAD, calm, speech sluggish, responses slow and delayed, but appropriate. Given ODT & PO meds for pain and nausea, tolerated. IVF infusing w.o to gravity. Smells of ETOH, VSS/WNL.

## 2011-06-09 NOTE — ED Notes (Signed)
CBG checked by EMT Fayrene Fearing and same noted to be 235.

## 2011-07-21 ENCOUNTER — Emergency Department (HOSPITAL_COMMUNITY)
Admission: EM | Admit: 2011-07-21 | Discharge: 2011-07-21 | Disposition: A | Discharge status: Home / Self Care | Payer: Self-pay | Attending: Emergency Medicine | Admitting: Emergency Medicine

## 2011-07-21 ENCOUNTER — Encounter (HOSPITAL_COMMUNITY): Payer: Self-pay | Admitting: *Deleted

## 2011-07-21 DIAGNOSIS — E119 Type 2 diabetes mellitus without complications: Secondary | ICD-10-CM | POA: Insufficient documentation

## 2011-07-21 DIAGNOSIS — Z87891 Personal history of nicotine dependence: Secondary | ICD-10-CM | POA: Insufficient documentation

## 2011-07-21 DIAGNOSIS — R109 Unspecified abdominal pain: Secondary | ICD-10-CM | POA: Insufficient documentation

## 2011-07-21 DIAGNOSIS — G8929 Other chronic pain: Secondary | ICD-10-CM | POA: Insufficient documentation

## 2011-07-21 DIAGNOSIS — I10 Essential (primary) hypertension: Secondary | ICD-10-CM | POA: Insufficient documentation

## 2011-07-21 DIAGNOSIS — Z9114 Patient's other noncompliance with medication regimen: Secondary | ICD-10-CM

## 2011-07-21 DIAGNOSIS — Z79899 Other long term (current) drug therapy: Secondary | ICD-10-CM | POA: Insufficient documentation

## 2011-07-21 DIAGNOSIS — F1010 Alcohol abuse, uncomplicated: Secondary | ICD-10-CM | POA: Insufficient documentation

## 2011-07-21 DIAGNOSIS — Z794 Long term (current) use of insulin: Secondary | ICD-10-CM | POA: Insufficient documentation

## 2011-07-21 DIAGNOSIS — R739 Hyperglycemia, unspecified: Secondary | ICD-10-CM

## 2011-07-21 LAB — DIFFERENTIAL
Basophils Absolute: 0 10*3/uL (ref 0.0–0.1)
Basophils Relative: 1 % (ref 0–1)
Lymphocytes Relative: 52 % — ABNORMAL HIGH (ref 12–46)
Monocytes Absolute: 0.3 10*3/uL (ref 0.1–1.0)
Neutro Abs: 2.6 10*3/uL (ref 1.7–7.7)

## 2011-07-21 LAB — COMPREHENSIVE METABOLIC PANEL
ALT: 16 U/L (ref 0–53)
AST: 18 U/L (ref 0–37)
Albumin: 3.9 g/dL (ref 3.5–5.2)
CO2: 20 mEq/L (ref 19–32)
Calcium: 9.1 mg/dL (ref 8.4–10.5)
Chloride: 96 mEq/L (ref 96–112)
Creatinine, Ser: 0.98 mg/dL (ref 0.50–1.35)
GFR calc non Af Amer: >90 mL/min (ref >90)
Sodium: 135 mEq/L (ref 135–145)
Total Bilirubin: 0.3 mg/dL (ref 0.3–1.2)

## 2011-07-21 LAB — CBC
MCHC: 36.1 g/dL — ABNORMAL HIGH (ref 30.0–36.0)
Platelets: 214 10*3/uL (ref 150–400)
RDW: 12.2 % (ref 11.5–15.5)
WBC: 6.3 10*3/uL (ref 4.0–10.5)

## 2011-07-21 MED ORDER — INSULIN ASPART 100 UNIT/ML ~~LOC~~ SOLN
4.0000 [IU] | Freq: Once | SUBCUTANEOUS | Status: AC
  Administered 2011-07-21: 4 [IU] via INTRAVENOUS
  Filled 2011-07-21: qty 1

## 2011-07-21 MED ORDER — KETOROLAC TROMETHAMINE 30 MG/ML IJ SOLN
30.0000 mg | Freq: Once | INTRAMUSCULAR | Status: AC
  Administered 2011-07-21: 30 mg via INTRAVENOUS
  Filled 2011-07-21: qty 1

## 2011-07-21 MED ORDER — SODIUM CHLORIDE 0.9 % IV BOLUS (SEPSIS)
1000.0000 mL | Freq: Once | INTRAVENOUS | Status: AC
  Administered 2011-07-21: 1000 mL via INTRAVENOUS

## 2011-07-21 MED ORDER — PROMETHAZINE HCL 25 MG PO TABS: 25.0000 mg | ORAL_TABLET | Freq: Four times a day (QID) | ORAL | Status: DC | PRN

## 2011-07-21 MED ORDER — HYDROMORPHONE HCL PF 1 MG/ML IJ SOLN
1.0000 mg | Freq: Once | INTRAMUSCULAR | Status: AC
  Administered 2011-07-21: 1 mg via INTRAVENOUS
  Filled 2011-07-21: qty 1

## 2011-07-21 NOTE — ED Provider Notes (Signed)
History     CSN: [id number]  Arrival date & time 07/21/11  0350   First MD Initiated Contact with Patient 07/21/11 0354      Chief Complaint  Patient presents with  . Abdominal Pain    (Consider location/radiation/quality/duration/timing/severity/associated sxs/prior treatment) HPI Comments: Patient with a long history of alcohol abuse, pancreatitis, chronic abdominal pain comes in tonight after drinking about 4 pina coladas and 1/2 pint of vodka.  He states started vomiting about 2 hours ago and then the pain became worse, reports has no pain medication at home for this.  Denies fever, chills, hematemesis, chest pain, shortness of breath.    Patient is a 51 y.o. male presenting with abdominal pain. The history is provided by the patient. No language interpreter was used.  Abdominal Pain The primary symptoms of the illness include abdominal pain, nausea and vomiting. The primary symptoms of the illness do not include fever, fatigue, shortness of breath, diarrhea, hematemesis, hematochezia or dysuria. The current episode started 1 to 2 hours ago. The onset of the illness was gradual. The problem has not changed since onset. The illness is associated with alcohol use. The patient has not had a change in bowel habit. Symptoms associated with the illness do not include chills, anorexia, diaphoresis, heartburn, constipation, urgency, hematuria, frequency or back pain. Significant associated medical issues include substance abuse.    Past Medical History  Diagnosis Date  . Diabetes mellitus   . Hypertension   . Seizures   . Pancreatitis   . Alcohol abuse   . Chronic abdominal pain   . Alcoholism /alcohol abuse     Past Surgical History  Procedure Date  . Appendectomy     Family History  Problem Relation Age of Onset  . Hypertension      History  Substance Use Topics  . Smoking status: Former Smoker    Types: Cigarettes    Quit date: 12/21/1984  . Smokeless tobacco: Not on  file  . Alcohol Use: Yes     heavy drinker: binge drinking per pt      Review of Systems  Constitutional: Negative for fever, chills, diaphoresis and fatigue.  Respiratory: Negative for shortness of breath.   Gastrointestinal: Positive for nausea, vomiting and abdominal pain. Negative for heartburn, diarrhea, constipation, hematochezia, anorexia and hematemesis.  Genitourinary: Negative for dysuria, urgency, frequency and hematuria.  Musculoskeletal: Negative for back pain.  All other systems reviewed and are negative.    Allergies  Morphine and related  Home Medications   Current Outpatient Rx  Name Route Sig Dispense Refill  . AMLODIPINE BESYLATE 5 MG PO TABS Oral Take 2 tablets (10 mg total) by mouth daily. 30 tablet 0  . AMYLASE-LIPASE-PROTEASE 66.05-27-73 MU PO CPEP Oral Take 1 capsule by mouth 3 (three) times daily with meals. Usually eats 2 large meals per day    . FENOFIBRATE 160 MG PO TABS Oral Take 1 tablet (160 mg total) by mouth daily with breakfast. 30 tablet 0  . FOLIC ACID 1 MG PO TABS Oral Take 1 tablet (1 mg total) by mouth daily.    Marland Kitchen GABAPENTIN 300 MG PO CAPS Oral Take 300 mg by mouth 3 (three) times daily.      . INSULIN ASPART 100 UNIT/ML [place] SOLN Subcutaneous Inject 14-18 Units into the skin 3 (three) times daily before meals. Based on sliding scale    . LANTUS [place] Subcutaneous Inject 40 Units into the skin at bedtime.     . ADULT  MULTIVITAMIN W/MINERALS CH Oral Take 1 tablet by mouth daily.    . OXYCODONE-ACETAMINOPHEN 5-325 MG PO TABS Oral Take 1-2 tablets by mouth every 4 (four) hours as needed. pain 20 tablet 0  . PHENYTOIN SODIUM EXTENDED 100 MG PO CAPS Oral Take 200 mg by mouth at bedtime.      Marland Kitchen SIMVASTATIN 20 MG PO TABS Oral Take 1 tablet (20 mg total) by mouth daily at 6 PM. 30 tablet 0  . TAMSULOSIN HCL 0.4 MG PO CAPS Oral Take 0.4 mg by mouth daily.    . THIAMINE HCL 100 MG PO TABS Oral Take 1 tablet (100 mg total) by mouth daily.      BP 130/83   Pulse 123  Temp 98.2 F (36.8 C) (Oral)  Resp 20  SpO2 98%  Physical Exam  Nursing note and vitals reviewed. Constitutional: He is oriented to person, place, and time. He appears well-developed and well-nourished. No distress.  HENT:  Head: Normocephalic and atraumatic.  Right Ear: External ear normal.  Left Ear: External ear normal.  Nose: Nose normal.  Mouth/Throat: Oropharynx is clear and moist. No oropharyngeal exudate.  Eyes: Conjunctivae are normal. Pupils are equal, round, and reactive to light. No scleral icterus.  Neck: Normal range of motion. Neck supple.  Cardiovascular: Regular rhythm and normal heart sounds.  Exam reveals no gallop and no friction rub.   No murmur heard.      tachycardia  Pulmonary/Chest: Effort normal and breath sounds normal. No respiratory distress. He has no wheezes. He has no rales. He exhibits no tenderness.  Abdominal: Soft. Bowel sounds are normal. He exhibits no distension and no mass. There is tenderness. There is guarding. There is no rebound.       Diffuse abdominal tenderness with guarding  Musculoskeletal: Normal range of motion. He exhibits no edema and no tenderness.  Lymphadenopathy:    He has no cervical adenopathy.  Neurological: He is alert and oriented to person, place, and time. No cranial nerve deficit.  Skin: Skin is warm and dry. No rash noted. No erythema. No pallor.  Psychiatric: He has a normal mood and affect. His behavior is normal. Judgment and thought content normal.    ED Course  Procedures (including critical care time)  Labs Reviewed  CBC - Abnormal; Notable for the following:    MCHC 36.1 (*)     All other components within normal limits  DIFFERENTIAL - Abnormal; Notable for the following:    Neutrophils Relative 41 (*)     Lymphocytes Relative 52 (*)     All other components within normal limits  COMPREHENSIVE METABOLIC PANEL - Abnormal; Notable for the following:    Glucose, Bld 378 (*)     All other  components within normal limits  LIPASE, BLOOD   No results found. Results for orders placed during the hospital encounter of 07/21/11  CBC      Component Value Range   WBC 6.3  4.0 - 10.5 K/uL   RBC 5.18  4.22 - 5.81 MIL/uL   Hemoglobin 15.0  13.0 - 17.0 g/dL   HCT 16.1  09.6 - 04.5 %   MCV 80.1  78.0 - 100.0 fL   MCH 29.0  26.0 - 34.0 pg   MCHC 36.1 (*) 30.0 - 36.0 g/dL   RDW 40.9  81.1 - 91.4 %   Platelets 214  150 - 400 K/uL  DIFFERENTIAL      Component Value Range   Neutrophils Relative 41 (*)  43 - 77 %   Neutro Abs 2.6  1.7 - 7.7 K/uL   Lymphocytes Relative 52 (*) 12 - 46 %   Lymphs Abs 3.3  0.7 - 4.0 K/uL   Monocytes Relative 5  3 - 12 %   Monocytes Absolute 0.3  0.1 - 1.0 K/uL   Eosinophils Relative 1  0 - 5 %   Eosinophils Absolute 0.1  0.0 - 0.7 K/uL   Basophils Relative 1  0 - 1 %   Basophils Absolute 0.0  0.0 - 0.1 K/uL  COMPREHENSIVE METABOLIC PANEL      Component Value Range   Sodium 135  135 - 145 mEq/L   Potassium 3.8  3.5 - 5.1 mEq/L   Chloride 96  96 - 112 mEq/L   CO2 20  19 - 32 mEq/L   Glucose, Bld 378 (*) 70 - 99 mg/dL   BUN 12  6 - 23 mg/dL   Creatinine, Ser 1.61  0.50 - 1.35 mg/dL   Calcium 9.1  8.4 - 09.6 mg/dL   Total Protein 7.8  6.0 - 8.3 g/dL   Albumin 3.9  3.5 - 5.2 g/dL   AST 18  0 - 37 U/L   ALT 16  0 - 53 U/L   Alkaline Phosphatase 90  39 - 117 U/L   Total Bilirubin 0.3  0.3 - 1.2 mg/dL   GFR calc non Af Amer >90  >90 mL/min   GFR calc Af Amer >90  >90 mL/min  LIPASE, BLOOD      Component Value Range   Lipase 11  11 - 59 U/L   No results found.    Alcohol abuse Chronic abdominal pain hyperglycemia   MDM  Patient with a long history of alcohol abuse and chronic abdominal pain with pancreatitis presents tonight after drinking alcohol and now with pain.  He is noted with elevated blood sugar as well.  We have given him a liter of fluids, pain control, nausea medication and insulin and will discharge the patient home with nausea  medication.  He has been counseled AGAIN on the consequences of drinking and medical non-compliance.  I will discharge home with nausea medication.       Izola Price [place], [place] 07/21/11 [phone]

## 2011-07-21 NOTE — Discharge Instructions (Signed)
Alcohol Intoxication You have alcohol intoxication when the amount of alcohol that you have consumed has impaired your ability to mentally and physically function. There are a variety of factors that contribute to the level at which alcohol intoxication can occur, such as age, gender, weight, frequency of alcohol consumption, medication use, and the presence of other medical conditions, such as diabetes, seizures, or heart conditions. The blood alcohol level test measures the concentration of alcohol in your blood. In most states, your blood alcohol level must be lower than 80 mg/dL (9.60%) to legally drive. However, many dangerous effects of alcohol can occur at much lower levels. Alcohol directly impairs the normal chemical activity of the brain and is said to be a chemical depressant. Alcohol can cause drowsiness, stupor, respiratory failure, and coma. Other physical effects can include headache, vomiting, vomiting of blood, abdominal pain, a fast heartbeat, difficulty breathing, anxiety, and amnesia. Alcohol intoxication can also lead to dangerous and life-threatening activities, such as fighting, dangerous operation of vehicles or heavy machinery, and risky sexual behavior. Alcohol can be especially dangerous when taken with other drugs. Some of these drugs are:  Sedatives.   Painkillers.   Marijuana.   Tranquilizers.   Antihistamines.   Muscle relaxants.   Seizure medicine.  Many of the effects of acute alcohol intoxication are temporary. However, repeated alcohol intoxication can lead to severe medical illnesses. If you have alcohol intoxication, you should:  Stay hydrated. Drink enough water and fluids to keep your urine clear or pale yellow. Avoid excessive caffeine because this can further lead to dehydration.   Eat a healthy diet. You may have residual nausea, headache, and loss of appetite, but it is still important that you maintain good nutrition. You can start with clear  liquids.   Take nonsteroidal anti-inflammatory medications as needed for headaches, but make sure to do so with small meals. You should avoid acetaminophen for several days after having alcohol intoxication because the combination of alcohol and acetaminophen can be toxic to your liver.  If you have frequent alcohol intoxication, ask your friends and family if they think you have a drinking problem. For further help, contact:  Your caregiver.   Alcoholics Anonymous (AA).   A drug or alcohol rehabilitation program.  SEEK MEDICAL CARE IF:   You have persistent vomiting.   You have persistent pain in any part of your body.   You do not feel better after a few days.  SEEK IMMEDIATE MEDICAL CARE IF:   You become shaky or tremble when you try to stop drinking.   You shake uncontrollably (seizure).   You throw up (vomit) blood. This may be bright red or it may look like black coffee grounds.   You have blood in the stool. This may be bright red or appear as a black, tarry, bad smelling stool.   You become lightheaded or faint.  ANY OF THESE SYMPTOMS MAY REPRESENT A SERIOUS PROBLEM THAT IS AN EMERGENCY. Do not wait to see if the symptoms will go away. Get medical help right away. Call your local emergency services (911 in [place] DO NOT drive yourself to the hospital. MAKE SURE YOU:   Understand these instructions.   Will watch your condition.   Will get help right away if you are not doing well or get worse.  Document Released: 11/03/2004 Document Revised: 01/13/2011 Document Reviewed: 07/13/2009 [facility] Patient Information 2012 [address], [place].Chronic Alcoholism Alcoholism is an addiction to alcohol. Addiction is a medical illness. It is not  an one-time incident of heavy drinking that defines the disease of alcoholism.  The characteristics of addictive disease, such as alcoholism, include behaviors that the person finds pleasurable, at least initially. In alcohol addiction, drinking  causes chemical changes in brain activity. This can lead to frequent cravings for alcohol. Unfortunately over time, an increased amount of alcohol is needed to produce the pleasure (tolerance). As a result, the person will start to feel uncomfortable symptoms when he or she is not drinking (withdrawal). Over time, a bad cycle develops. When painful withdrawal symptoms start to appear, alcohol is needed to make the symptoms go away. During this process, the person addicted to alcohol may become so used to the effects of alcohol that the usual signs of intoxication such as slurred speech, a staggering walk, or sleepiness may no longer be shown. Many people addicted to alcohol are able to function and even complete tasks. CAUSES   Drinking heavily and frequently.   Other factors like genetics.  SYMPTOMS   Headaches.   Frequent trouble falling or staying asleep (insomnia).   Irritability.   Uncontrolled shaking or movement (tremors).   Forgetting events (brownouts) or passing out (blackouts).   Seizures or hallucinations (delirium tremens).   Problems at work or at home that are related to drinking.   Medical problems related to drinking such as heart disease, stroke, high blood pressure, diabetes, stomach ulcers, bleeding from the GI tract, and liver failure.   Trauma (falls, broken bones, automobile crashes).  TREATMENT  Alcoholism usually gets worse over time and almost never gets better without treatment. Your caregiver can help recommend a course of treatment for you depending on how severe your symptoms are and the level of your alcohol abuse. In some patients, stopping alcohol use or even decreasing use can bring about withdrawal symptoms that are dangerous or even deadly. For this reason, hospitalization is sometimes required to medically stabilize a patient.  If hospitalization is not required, but the risk of withdrawal is high, a detoxification (detox) facility may be recommended  as an initial treatment step. In a detox center, medications can be given to protect against seizures and other withdrawal symptoms. Rehabilitation treatment may also be necessary. This is the process of treating the psychological and lifestyle element of addiction. There is both inpatient and outpatient rehabilitation treatment. Inpatient programs help patients through a systematic plan of psychological questioning, both individually and in groups, and at times using a "twelve-step" format. Outpatient rehabilitation programs have a similar structure and are aimed at promoting continued sobriety and preventing relapse into addiction. Make treatment decisions together with your caregiver. HOME CARE INSTRUCTIONS   If you are concerned about your alcohol use, talk to someone who can help. Trying to quit on your own is not easy. It can even be medically dangerous. This is especially true if you have been drinking heavily for a long time.   Call your caregiver, Alcoholics Anonymous, or other alcoholic treatment programs for help.   AL-ANON and ALA-TEEN are support groups for friends and family members of an alcohol or drug dependent person. These people also often need help too. For information about these organizations, check your phone directory or the internet. You can also call a local alcohol or chemical dependency treatment center.  Document Released: 03/03/2004 Document Revised: 01/13/2011 Document Reviewed: 07/10/2009 [facility] Patient Information 2012 [place], [place].Hyperglycemia Hyperglycemia occurs when the glucose (sugar) in your blood is too high. Hyperglycemia can happen for many reasons, but it most often happens  to people who do not know they have diabetes or are not managing their diabetes properly.  CAUSES  Whether you have diabetes or not, there are other causes of hyperglycemia. Hyperglycemia can occur when you have diabetes, but it can also occur in other situations that you might not  be as aware of, such as: Diabetes  If you have diabetes and are having problems controlling your blood glucose, hyperglycemia could occur because of some of the following reasons:   Not following your meal plan.   Not taking your diabetes medications or not taking it properly.   Exercising less or doing less activity than you normally do.   Being sick.  Pre-diabetes  This cannot be ignored. Before people develop Type 2 diabetes, they almost always have "pre-diabetes." This is when your blood glucose levels are higher than normal, but not yet high enough to be diagnosed as diabetes. Research has shown that some long-term damage to the body, especially the heart and circulatory system, may already be occurring during pre-diabetes. If you take action to manage your blood glucose when you have pre-diabetes, you may delay or prevent Type 2 diabetes from developing.  Stress  If you have diabetes, you may be "diet" controlled or on oral medications or insulin to control your diabetes. However, you may find that your blood glucose is higher than usual in the hospital whether you have diabetes or not. This is often referred to as "stress hyperglycemia." Stress can elevate your blood glucose. This happens because of hormones put out by the body during times of stress. If stress has been the cause of your high blood glucose, it can be followed regularly by your caregiver. That way he/she can make sure your hyperglycemia does not continue to get worse or progress to diabetes.  Steroids  Steroids are medications that act on the infection fighting system (immune system) to block inflammation or infection. One side effect can be a rise in blood glucose. Most people can produce enough extra insulin to allow for this rise, but for those who cannot, steroids make blood glucose levels go even higher. It is not unusual for steroid treatments to "uncover" diabetes that is developing. It is not always possible to  determine if the hyperglycemia will go away after the steroids are stopped. A special blood test called an A1c is sometimes done to determine if your blood glucose was elevated before the steroids were started.  SYMPTOMS  Thirsty.   Frequent urination.   Dry mouth.   Blurred vision.   Tired or fatigue.   Weakness.   Sleepy.   Tingling in feet or leg.  DIAGNOSIS  Diagnosis is made by monitoring blood glucose in one or all of the following ways:  A1c test. This is a chemical found in your blood.   Fingerstick blood glucose monitoring.   Laboratory results.  TREATMENT  First, knowing the cause of the hyperglycemia is important before the hyperglycemia can be treated. Treatment may include, but is not be limited to:  Education.   Change or adjustment in medications.   Change or adjustment in meal plan.   Treatment for an illness, infection, etc.   More frequent blood glucose monitoring.   Change in exercise plan.   Decreasing or stopping steroids.   Lifestyle changes.  HOME CARE INSTRUCTIONS   Test your blood glucose as directed.   Exercise regularly. Your caregiver will give you instructions about exercise. Pre-diabetes or diabetes which comes on with stress is helped  by exercising.   Eat wholesome, balanced meals. Eat often and at regular, fixed times. Your caregiver or nutritionist will give you a meal plan to guide your sugar intake.   Being at an ideal weight is important. If needed, losing as little as 10 to 15 pounds may help improve blood glucose levels.  SEEK MEDICAL CARE IF:   You have questions about medicine, activity, or diet.   You continue to have symptoms (problems such as increased thirst, urination, or weight gain).  SEEK IMMEDIATE MEDICAL CARE IF:   You are vomiting or have diarrhea.   Your breath smells fruity.   You are breathing faster or slower.   You are very sleepy or incoherent.   You have numbness, tingling, or pain in your  feet or hands.   You have chest pain.   Your symptoms get worse even though you have been following your caregiver's orders.   If you have any other questions or concerns.  Document Released: 07/20/2000 Document Revised: 01/13/2011 Document Reviewed: 09/15/2008 [facility] Patient Information 2012 [place], [place].

## 2011-07-21 NOTE — ED Provider Notes (Signed)
Medical screening examination/treatment/procedure(s) were performed by non-physician practitioner and as supervising physician I was immediately available for consultation/collaboration.  Jasmine Awe, MD 07/21/11 [phone]

## 2011-07-21 NOTE — ED Notes (Signed)
Patient resting at this time.  Will discharge later

## 2011-07-21 NOTE — ED Notes (Signed)
Patient arrived via EMS with c/o left sided abd pain after consuming 4 pina coladas and some vodka  History of pancreatitis

## 2011-07-30 ENCOUNTER — Emergency Department (HOSPITAL_COMMUNITY)
Admission: EM | Admit: 2011-07-30 | Discharge: 2011-07-30 | Disposition: A | Discharge status: Home / Self Care | Payer: Self-pay | Attending: Emergency Medicine | Admitting: Emergency Medicine

## 2011-07-30 ENCOUNTER — Encounter (HOSPITAL_COMMUNITY): Payer: Self-pay | Admitting: *Deleted

## 2011-07-30 DIAGNOSIS — G8929 Other chronic pain: Secondary | ICD-10-CM | POA: Insufficient documentation

## 2011-07-30 DIAGNOSIS — Z794 Long term (current) use of insulin: Secondary | ICD-10-CM | POA: Insufficient documentation

## 2011-07-30 DIAGNOSIS — R109 Unspecified abdominal pain: Secondary | ICD-10-CM | POA: Insufficient documentation

## 2011-07-30 DIAGNOSIS — R739 Hyperglycemia, unspecified: Secondary | ICD-10-CM

## 2011-07-30 DIAGNOSIS — E119 Type 2 diabetes mellitus without complications: Secondary | ICD-10-CM | POA: Insufficient documentation

## 2011-07-30 DIAGNOSIS — F1010 Alcohol abuse, uncomplicated: Secondary | ICD-10-CM | POA: Insufficient documentation

## 2011-07-30 DIAGNOSIS — K861 Other chronic pancreatitis: Secondary | ICD-10-CM | POA: Insufficient documentation

## 2011-07-30 DIAGNOSIS — I10 Essential (primary) hypertension: Secondary | ICD-10-CM | POA: Insufficient documentation

## 2011-07-30 DIAGNOSIS — R112 Nausea with vomiting, unspecified: Secondary | ICD-10-CM | POA: Insufficient documentation

## 2011-07-30 LAB — LIPASE, BLOOD: Lipase: 10 U/L — ABNORMAL LOW (ref 11–59)

## 2011-07-30 LAB — GLUCOSE, CAPILLARY
Glucose-Capillary: 351 mg/dL — ABNORMAL HIGH (ref 70–99)
Glucose-Capillary: 211 mg/dL — ABNORMAL HIGH (ref 70–99)

## 2011-07-30 MED ORDER — PROMETHAZINE HCL 25 MG PO TABS: 25.0000 mg | ORAL_TABLET | Freq: Four times a day (QID) | ORAL | Status: AC | PRN

## 2011-07-30 MED ORDER — INSULIN ASPART 100 UNIT/ML ~~LOC~~ SOLN
4.0000 [IU] | Freq: Once | SUBCUTANEOUS | Status: AC
  Administered 2011-07-30: 4 [IU] via INTRAVENOUS

## 2011-07-30 MED ORDER — SODIUM CHLORIDE 0.9 % IV BOLUS (SEPSIS)
1000.0000 mL | Freq: Once | INTRAVENOUS | Status: AC
  Administered 2011-07-30: 1000 mL via INTRAVENOUS

## 2011-07-30 MED ORDER — ONDANSETRON HCL 4 MG/2ML IJ SOLN
4.0000 mg | Freq: Once | INTRAMUSCULAR | Status: AC
Start: 2011-07-30 — End: 2011-07-30
  Administered 2011-07-30: 4 mg via INTRAVENOUS

## 2011-07-30 MED ORDER — ONDANSETRON HCL 4 MG/2ML IJ SOLN
INTRAMUSCULAR | Status: AC
  Filled 2011-07-30: qty 2

## 2011-07-30 MED ORDER — HYDROMORPHONE HCL PF 1 MG/ML IJ SOLN: 1.0000 mg | INTRAMUSCULAR | Status: DC

## 2011-07-30 MED ORDER — PROMETHAZINE HCL 25 MG/ML IJ SOLN
12.5000 mg | Freq: Once | INTRAMUSCULAR | Status: AC
  Administered 2011-07-30: 05:00:00 via INTRAVENOUS
  Filled 2011-07-30 (×2): qty 1

## 2011-07-30 MED ORDER — HYDROMORPHONE HCL PF 1 MG/ML IJ SOLN
1.0000 mg | Freq: Once | INTRAMUSCULAR | Status: AC
  Administered 2011-07-30: 1 mg via INTRAVENOUS
  Filled 2011-07-30: qty 1

## 2011-07-30 NOTE — ED Notes (Signed)
Pt having episode of nausea and vomiting.   Gave protocol zofran at this time

## 2011-07-30 NOTE — Discharge Instructions (Signed)
Alcohol Intoxication You have alcohol intoxication when the amount of alcohol that you have consumed has impaired your ability to mentally and physically function. There are a variety of factors that contribute to the level at which alcohol intoxication can occur, such as age, gender, weight, frequency of alcohol consumption, medication use, and the presence of other medical conditions, such as diabetes, seizures, or heart conditions. The blood alcohol level test measures the concentration of alcohol in your blood. In most states, your blood alcohol level must be lower than 80 mg/dL (9.60%) to legally drive. However, many dangerous effects of alcohol can occur at much lower levels. Alcohol directly impairs the normal chemical activity of the brain and is said to be a chemical depressant. Alcohol can cause drowsiness, stupor, respiratory failure, and coma. Other physical effects can include headache, vomiting, vomiting of blood, abdominal pain, a fast heartbeat, difficulty breathing, anxiety, and amnesia. Alcohol intoxication can also lead to dangerous and life-threatening activities, such as fighting, dangerous operation of vehicles or heavy machinery, and risky sexual behavior. Alcohol can be especially dangerous when taken with other drugs. Some of these drugs are:  Sedatives.   Painkillers.   Marijuana.   Tranquilizers.   Antihistamines.   Muscle relaxants.   Seizure medicine.  Many of the effects of acute alcohol intoxication are temporary. However, repeated alcohol intoxication can lead to severe medical illnesses. If you have alcohol intoxication, you should:  Stay hydrated. Drink enough water and fluids to keep your urine clear or pale yellow. Avoid excessive caffeine because this can further lead to dehydration.   Eat a healthy diet. You may have residual nausea, headache, and loss of appetite, but it is still important that you maintain good nutrition. You can start with clear  liquids.   Take nonsteroidal anti-inflammatory medications as needed for headaches, but make sure to do so with small meals. You should avoid acetaminophen for several days after having alcohol intoxication because the combination of alcohol and acetaminophen can be toxic to your liver.  If you have frequent alcohol intoxication, ask your friends and family if they think you have a drinking problem. For further help, contact:  Your caregiver.   Alcoholics Anonymous (AA).   A drug or alcohol rehabilitation program.  SEEK MEDICAL CARE IF:   You have persistent vomiting.   You have persistent pain in any part of your body.   You do not feel better after a few days.  SEEK IMMEDIATE MEDICAL CARE IF:   You become shaky or tremble when you try to stop drinking.   You shake uncontrollably (seizure).   You throw up (vomit) blood. This may be bright red or it may look like black coffee grounds.   You have blood in the stool. This may be bright red or appear as a black, tarry, bad smelling stool.   You become lightheaded or faint.  ANY OF THESE SYMPTOMS MAY REPRESENT A SERIOUS PROBLEM THAT IS AN EMERGENCY. Do not wait to see if the symptoms will go away. Get medical help right away. Call your local emergency services (911 in [place] DO NOT drive yourself to the hospital. MAKE SURE YOU:   Understand these instructions.   Will watch your condition.   Will get help right away if you are not doing well or get worse.  Document Released: 11/03/2004 Document Revised: 01/13/2011 Document Reviewed: 07/13/2009 [facility] Patient Information 2012 [address], [place].Chronic Alcoholism Alcoholism is an addiction to alcohol. Addiction is a medical illness. It is not  an one-time incident of heavy drinking that defines the disease of alcoholism.  The characteristics of addictive disease, such as alcoholism, include behaviors that the person finds pleasurable, at least initially. In alcohol addiction, drinking  causes chemical changes in brain activity. This can lead to frequent cravings for alcohol. Unfortunately over time, an increased amount of alcohol is needed to produce the pleasure (tolerance). As a result, the person will start to feel uncomfortable symptoms when he or she is not drinking (withdrawal). Over time, a bad cycle develops. When painful withdrawal symptoms start to appear, alcohol is needed to make the symptoms go away. During this process, the person addicted to alcohol may become so used to the effects of alcohol that the usual signs of intoxication such as slurred speech, a staggering walk, or sleepiness may no longer be shown. Many people addicted to alcohol are able to function and even complete tasks. CAUSES   Drinking heavily and frequently.   Other factors like genetics.  SYMPTOMS   Headaches.   Frequent trouble falling or staying asleep (insomnia).   Irritability.   Uncontrolled shaking or movement (tremors).   Forgetting events (brownouts) or passing out (blackouts).   Seizures or hallucinations (delirium tremens).   Problems at work or at home that are related to drinking.   Medical problems related to drinking such as heart disease, stroke, high blood pressure, diabetes, stomach ulcers, bleeding from the GI tract, and liver failure.   Trauma (falls, broken bones, automobile crashes).  TREATMENT  Alcoholism usually gets worse over time and almost never gets better without treatment. Your caregiver can help recommend a course of treatment for you depending on how severe your symptoms are and the level of your alcohol abuse. In some patients, stopping alcohol use or even decreasing use can bring about withdrawal symptoms that are dangerous or even deadly. For this reason, hospitalization is sometimes required to medically stabilize a patient.  If hospitalization is not required, but the risk of withdrawal is high, a detoxification (detox) facility may be recommended  as an initial treatment step. In a detox center, medications can be given to protect against seizures and other withdrawal symptoms. Rehabilitation treatment may also be necessary. This is the process of treating the psychological and lifestyle element of addiction. There is both inpatient and outpatient rehabilitation treatment. Inpatient programs help patients through a systematic plan of psychological questioning, both individually and in groups, and at times using a "twelve-step" format. Outpatient rehabilitation programs have a similar structure and are aimed at promoting continued sobriety and preventing relapse into addiction. Make treatment decisions together with your caregiver. HOME CARE INSTRUCTIONS   If you are concerned about your alcohol use, talk to someone who can help. Trying to quit on your own is not easy. It can even be medically dangerous. This is especially true if you have been drinking heavily for a long time.   Call your caregiver, Alcoholics Anonymous, or other alcoholic treatment programs for help.   AL-ANON and ALA-TEEN are support groups for friends and family members of an alcohol or drug dependent person. These people also often need help too. For information about these organizations, check your phone directory or the internet. You can also call a local alcohol or chemical dependency treatment center.  Document Released: 03/03/2004 Document Revised: 01/13/2011 Document Reviewed: 07/10/2009 [facility] Patient Information 2012 [place], [place].High Blood Sugar High blood sugar (hyperglycemia) means that the level of sugar in your blood is higher than it should be. Signs of  high blood sugar include:  Feeling thirsty.   Frequent peeing (urinating).   Feeling tired or sleepy.   Dry mouth.   Vision changes.   Feeling weak.   Feeling hungry but losing weight.   Numbness and tingling in your hands or feet.   Headache.  When you ignore these signs, your blood sugar  may keep going up. These problems may get worse, and other problems may begin. HOME CARE  Check your blood sugars as told by your doctor. Write down the numbers with the date and time.   Take the right amount of insulin or diabetes pills at the right time. Write down the dose with date and time.   Refill your insulin or diabetes pills before running out.   Watch what you eat. Follow your meal plan.   Drink liquids without sugar, such as water. Check with your doctor if you have kidney or heart disease.   Follow your doctor's orders for exercise. Exercise at the same time of day.   Keep your doctor's appointments.  GET HELP RIGHT AWAY IF:   You have trouble thinking or are confused.   You have fast breathing with fruity smelling breath.   You pass out (faint).   You have 2 to 3 days of high blood sugars and you do not know why.   You have chest pain.   You are feeling sick to your stomach (nauseous) or throwing up (vomiting).   You have sudden vision changes.  MAKE SURE YOU:   Understand these instructions.   Will watch your condition.   Will get help right away if you are not doing well or get worse.  Document Released: 11/21/2008 Document Revised: 01/13/2011 Document Reviewed: 11/21/2008 [facility] Patient Information 2012 [place], [place].

## 2011-07-30 NOTE — ED Notes (Addendum)
Pt with hx of pancreatitis and ethoh abuse to ED via ems for abd pain.  CBG of 411, pt states he drank 2 bottles of wine and 1 bottle of gin today as well as smoked marijuana.  Pt appears intoxicated.

## 2011-07-30 NOTE — ED Provider Notes (Signed)
History     CSN: [id number]  Arrival date & time 07/30/11  0041   First MD Initiated Contact with Patient 07/30/11 0231      Chief Complaint  Patient presents with  . Abdominal Pain  . Hyperglycemia  . Alcohol Intoxication    (Consider location/radiation/quality/duration/timing/severity/associated sxs/prior treatment) HPI Comments: Patient with a long history of alcohol abuse, DM and chronic pancreatitis presents tonight after having drank 2 bottles of wine and a bottle of gin today with complaints of diffuse upper abdominal pain - he reports multiple episodes of NBNB vomit - he reports that he has not taken his insulin as well.  He has a similar recent visit for the same about 1 week ago - he states that he did not know that he was not supposed to drink, however upon discharging him last week I specifically told him he could not drink alcohol.  He denies fever, chills, chest pain, shortness of breath, constipation or diarrhea.    Patient is a 51 y.o. male presenting with abdominal pain and intoxication. The history is provided by the patient. No language interpreter was used.  Abdominal Pain The primary symptoms of the illness include abdominal pain, nausea and vomiting. The primary symptoms of the illness do not include fever, fatigue, shortness of breath, diarrhea, hematemesis, hematochezia or dysuria. The current episode started 13 to 24 hours ago. The onset of the illness was sudden. The problem has not changed since onset. The illness is associated with alcohol use. The patient has not had a change in bowel habit. Symptoms associated with the illness do not include chills, anorexia, diaphoresis, heartburn, constipation, urgency, hematuria, frequency or back pain. Significant associated medical issues include substance abuse.  Alcohol Intoxication Associated symptoms include abdominal pain, nausea and vomiting. Pertinent negatives include no anorexia, chest pain, chills, diaphoresis,  fatigue, fever or neck pain.    Past Medical History  Diagnosis Date  . Diabetes mellitus   . Hypertension   . Seizures   . Pancreatitis   . Alcohol abuse   . Chronic abdominal pain   . Alcoholism /alcohol abuse     Past Surgical History  Procedure Date  . Appendectomy     Family History  Problem Relation Age of Onset  . Hypertension      History  Substance Use Topics  . Smoking status: Former Smoker    Types: Cigarettes    Quit date: 12/21/1984  . Smokeless tobacco: Not on file  . Alcohol Use: Yes     heavy drinker: binge drinking per pt      Review of Systems  Constitutional: Negative for fever, chills, diaphoresis and fatigue.  HENT: Negative for neck pain.   Eyes: Negative for pain.  Respiratory: Negative for shortness of breath.   Cardiovascular: Negative for chest pain.  Gastrointestinal: Positive for nausea, vomiting and abdominal pain. Negative for heartburn, diarrhea, constipation, hematochezia, anorexia and hematemesis.  Genitourinary: Negative for dysuria, urgency, frequency and hematuria.  Musculoskeletal: Negative for back pain.  All other systems reviewed and are negative.    Allergies  Morphine and related  Home Medications   Current Outpatient Rx  Name Route Sig Dispense Refill  . AMLODIPINE BESYLATE 5 MG PO TABS Oral Take 2 tablets (10 mg total) by mouth daily. 30 tablet 0  . AMYLASE-LIPASE-PROTEASE 66.05-27-73 MU PO CPEP Oral Take 1 capsule by mouth 3 (three) times daily with meals. Usually eats 2 large meals per day    . FENOFIBRATE 160 MG PO  TABS Oral Take 1 tablet (160 mg total) by mouth daily with breakfast. 30 tablet 0  . FOLIC ACID 1 MG PO TABS Oral Take 1 tablet (1 mg total) by mouth daily.    Marland Kitchen GABAPENTIN 300 MG PO CAPS Oral Take 300 mg by mouth 3 (three) times daily.      . INSULIN ASPART 100 UNIT/ML [place] SOLN Subcutaneous Inject 14-18 Units into the skin 3 (three) times daily before meals. Based on sliding scale    . LANTUS [place]  Subcutaneous Inject 40 Units into the skin at bedtime.     . ADULT MULTIVITAMIN W/MINERALS CH Oral Take 1 tablet by mouth daily.    . OXYCODONE-ACETAMINOPHEN 5-325 MG PO TABS Oral Take 1-2 tablets by mouth every 4 (four) hours as needed. pain 20 tablet 0  . PHENYTOIN SODIUM EXTENDED 100 MG PO CAPS Oral Take 200 mg by mouth at bedtime.      Marland Kitchen SIMVASTATIN 20 MG PO TABS Oral Take 1 tablet (20 mg total) by mouth daily at 6 PM. 30 tablet 0  . TAMSULOSIN HCL 0.4 MG PO CAPS Oral Take 0.4 mg by mouth daily.    . THIAMINE HCL 100 MG PO TABS Oral Take 1 tablet (100 mg total) by mouth daily.      BP 113/79  Pulse 105  Temp 98.4 F (36.9 C) (Oral)  Resp 22  SpO2 98%  Physical Exam  Nursing note and vitals reviewed. Constitutional: He is oriented to person, place, and time. He appears well-developed and well-nourished.       tearful  HENT:  Head: Normocephalic and atraumatic.  Right Ear: External ear normal.  Left Ear: External ear normal.  Nose: Nose normal.  Mouth/Throat: Oropharynx is clear and moist.  Eyes: Conjunctivae are normal. Pupils are equal, round, and reactive to light. No scleral icterus.  Neck: Normal range of motion. Neck supple.  Cardiovascular: Regular rhythm and normal heart sounds.  Exam reveals no gallop and no friction rub.   No murmur heard.      tachycardia  Pulmonary/Chest: Effort normal and breath sounds normal. No respiratory distress. He has no wheezes. He has no rales. He exhibits no tenderness.  Abdominal: Soft. Bowel sounds are normal. He exhibits no distension and no mass. There is tenderness. There is guarding. There is no rebound.    Musculoskeletal: Normal range of motion. He exhibits no edema and no tenderness.  Lymphadenopathy:    He has no cervical adenopathy.  Neurological: He is alert and oriented to person, place, and time. No cranial nerve deficit.  Skin: Skin is warm and dry. No rash noted. No erythema. No pallor.  Psychiatric: He has a normal  mood and affect. His behavior is normal. Judgment and thought content normal.    ED Course  Procedures (including critical care time)  Labs Reviewed  LIPASE, BLOOD - Abnormal; Notable for the following:    Lipase 10 (*)     All other components within normal limits  GLUCOSE, CAPILLARY - Abnormal; Notable for the following:    Glucose-Capillary 351 (*)     All other components within normal limits   No results found. Results for orders placed during the hospital encounter of 07/30/11  LIPASE, BLOOD      Component Value Range   Lipase 10 (*) 11 - 59 U/L  GLUCOSE, CAPILLARY      Component Value Range   Glucose-Capillary 351 (*) 70 - 99 mg/dL   Comment 1 Notify RN  Comment 2 Documented in Chart    GLUCOSE, CAPILLARY      Component Value Range   Glucose-Capillary 211 (*) 70 - 99 mg/dL   No results found.    Abdominal pain hyperglycemia    MDM  Patient who is very well known to this provider presents tonight with acute onset of abdominal pain after drinking alcohol.  The patient also has increased blood sugar which has normalized with fluids and insulin.  Patient without vomiting while here - abdominal exam with diffuse ttp without surgical signs.        Izola Price [place], [place] 07/30/11 [phone]

## 2011-07-30 NOTE — ED Provider Notes (Signed)
Medical screening examination/treatment/procedure(s) were performed by non-physician practitioner and as supervising physician I was immediately available for consultation/collaboration.   Dayton Bailiff, MD 07/30/11 [phone]

## 2011-08-01 MED FILL — Insulin Aspart Inj 100 Unit/ML: SUBCUTANEOUS | Qty: 0.04 | Status: AC

## 2011-08-16 ENCOUNTER — Emergency Department (HOSPITAL_COMMUNITY)
Admission: EM | Admit: 2011-08-16 | Discharge: 2011-08-16 | Disposition: A | Discharge status: Home / Self Care | Payer: Self-pay | Attending: Emergency Medicine | Admitting: Emergency Medicine

## 2011-08-16 ENCOUNTER — Encounter (HOSPITAL_COMMUNITY): Payer: Self-pay | Admitting: *Deleted

## 2011-08-16 DIAGNOSIS — G8929 Other chronic pain: Secondary | ICD-10-CM | POA: Insufficient documentation

## 2011-08-16 DIAGNOSIS — R109 Unspecified abdominal pain: Secondary | ICD-10-CM | POA: Insufficient documentation

## 2011-08-16 DIAGNOSIS — Z794 Long term (current) use of insulin: Secondary | ICD-10-CM | POA: Insufficient documentation

## 2011-08-16 DIAGNOSIS — Z79899 Other long term (current) drug therapy: Secondary | ICD-10-CM | POA: Insufficient documentation

## 2011-08-16 DIAGNOSIS — G40909 Epilepsy, unspecified, not intractable, without status epilepticus: Secondary | ICD-10-CM | POA: Insufficient documentation

## 2011-08-16 DIAGNOSIS — I10 Essential (primary) hypertension: Secondary | ICD-10-CM | POA: Insufficient documentation

## 2011-08-16 DIAGNOSIS — R739 Hyperglycemia, unspecified: Secondary | ICD-10-CM

## 2011-08-16 DIAGNOSIS — F1010 Alcohol abuse, uncomplicated: Secondary | ICD-10-CM | POA: Insufficient documentation

## 2011-08-16 DIAGNOSIS — R110 Nausea: Secondary | ICD-10-CM | POA: Insufficient documentation

## 2011-08-16 DIAGNOSIS — E119 Type 2 diabetes mellitus without complications: Secondary | ICD-10-CM | POA: Insufficient documentation

## 2011-08-16 LAB — URINE MICROSCOPIC-ADD ON

## 2011-08-16 LAB — BASIC METABOLIC PANEL
BUN: 13 mg/dL (ref 6–23)
CO2: 20 mEq/L (ref 19–32)
Chloride: 90 mEq/L — ABNORMAL LOW (ref 96–112)
GFR calc Af Amer: >90 mL/min (ref >90)
Glucose, Bld: 517 mg/dL — ABNORMAL HIGH (ref 70–99)
Potassium: 4 mEq/L (ref 3.5–5.1)

## 2011-08-16 LAB — GLUCOSE, CAPILLARY
Glucose-Capillary: 534 mg/dL — ABNORMAL HIGH (ref 70–99)
Glucose-Capillary: 292 mg/dL — ABNORMAL HIGH (ref 70–99)

## 2011-08-16 LAB — URINALYSIS, ROUTINE W REFLEX MICROSCOPIC
Hgb urine dipstick: NEGATIVE
Specific Gravity, Urine: 1.041 — ABNORMAL HIGH (ref 1.005–1.030)
Urobilinogen, UA: 0.2 mg/dL (ref 0.0–1.0)

## 2011-08-16 LAB — CBC WITH DIFFERENTIAL/PLATELET
Basophils Relative: 1 % (ref 0–1)
Hemoglobin: 15.9 g/dL (ref 13.0–17.0)
Lymphocytes Relative: 44 % (ref 12–46)
Lymphs Abs: 2.8 10*3/uL (ref 0.7–4.0)
Monocytes Relative: 5 % (ref 3–12)
Neutro Abs: 3.1 10*3/uL (ref 1.7–7.7)
Neutrophils Relative %: 49 % (ref 43–77)
RBC: 5.41 MIL/uL (ref 4.22–5.81)
WBC: 6.4 10*3/uL (ref 4.0–10.5)

## 2011-08-16 LAB — POCT I-STAT, CHEM 8
HCT: 44 % (ref 39.0–52.0)
Hemoglobin: 15 g/dL (ref 13.0–17.0)
Potassium: 3.7 mEq/L (ref 3.5–5.1)
Sodium: 140 mEq/L (ref 135–145)

## 2011-08-16 MED ORDER — SODIUM CHLORIDE 0.9 % IV BOLUS (SEPSIS)
1000.0000 mL | Freq: Once | INTRAVENOUS | Status: AC
  Administered 2011-08-16: 1000 mL via INTRAVENOUS

## 2011-08-16 MED ORDER — INSULIN ASPART 100 UNIT/ML ~~LOC~~ SOLN
10.0000 [IU] | Freq: Once | SUBCUTANEOUS | Status: AC
  Administered 2011-08-16: 10 [IU] via SUBCUTANEOUS
  Filled 2011-08-16: qty 1

## 2011-08-16 MED ORDER — PROMETHAZINE HCL 25 MG PO TABS: 25.0000 mg | ORAL_TABLET | Freq: Four times a day (QID) | ORAL | Status: AC | PRN

## 2011-08-16 MED ORDER — INSULIN ASPART 100 UNIT/ML ~~LOC~~ SOLN
8.0000 [IU] | Freq: Once | SUBCUTANEOUS | Status: AC
  Administered 2011-08-16: 8 [IU] via INTRAVENOUS
  Filled 2011-08-16: qty 1

## 2011-08-16 MED ORDER — GI COCKTAIL ~~LOC~~
30.0000 mL | Freq: Once | ORAL | Status: AC
  Administered 2011-08-16: 30 mL via ORAL
  Filled 2011-08-16: qty 30

## 2011-08-16 MED ORDER — PROMETHAZINE HCL 25 MG/ML IJ SOLN
25.0000 mg | Freq: Once | INTRAMUSCULAR | Status: AC
  Administered 2011-08-16: 25 mg via INTRAVENOUS
  Filled 2011-08-16 (×2): qty 1

## 2011-08-16 MED ORDER — INSULIN ASPART 100 UNIT/ML ~~LOC~~ SOLN
5.0000 [IU] | Freq: Once | SUBCUTANEOUS | Status: AC
  Administered 2011-08-16: 5 [IU] via SUBCUTANEOUS
  Filled 2011-08-16: qty 1

## 2011-08-16 NOTE — ED Provider Notes (Signed)
History     CSN: [id number]  Arrival date & time 08/16/11  2130   First MD Initiated Contact with Patient 08/16/11 [phone]      No chief complaint on file.   (Consider location/radiation/quality/duration/timing/severity/associated sxs/prior treatment) HPI 51 year old male presents emergency apartment with complaint of abdominal pain after alcohol and marijuana binge tonight. Patient well-known to the emergency department for chronic abdominal pain secondary to pancreatitis and gastritis from alcohol abuse. Patient also has a insulin-dependent diabetic. Patient does not want alcohol rehabilitation at this time. Patient reports he stopped drinking when they stop making alcohol.  Patient has had nausea without vomiting. He denies chest pain or shortness breath. Abdominal pain is in his upper abdominal region and wraps around the sides.    Past Medical History  Diagnosis Date  . Diabetes mellitus   . Hypertension   . Seizures   . Pancreatitis   . Alcohol abuse   . Chronic abdominal pain   . Alcoholism /alcohol abuse     Past Surgical History  Procedure Date  . Appendectomy     Family History  Problem Relation Age of Onset  . Hypertension      History  Substance Use Topics  . Smoking status: Former Smoker    Types: Cigarettes    Quit date: 12/21/1984  . Smokeless tobacco: Not on file  . Alcohol Use: Yes     heavy drinker: binge drinking per pt      Review of Systems  Unable to perform ROS: Other   secondary to alcohol intoxication  Allergies  Morphine and related  Home Medications   Current Outpatient Rx  Name Route Sig Dispense Refill  . AMLODIPINE BESYLATE 5 MG PO TABS Oral Take 2 tablets (10 mg total) by mouth daily. 30 tablet 0  . AMYLASE-LIPASE-PROTEASE 66.05-27-73 MU PO CPEP Oral Take 1 capsule by mouth 3 (three) times daily with meals. Usually eats 2 large meals per day    . FENOFIBRATE 160 MG PO TABS Oral Take 1 tablet (160 mg total) by mouth daily with  breakfast. 30 tablet 0  . FOLIC ACID 1 MG PO TABS Oral Take 1 tablet (1 mg total) by mouth daily.    Marland Kitchen GABAPENTIN 300 MG PO CAPS Oral Take 300 mg by mouth 3 (three) times daily.      . INSULIN ASPART 100 UNIT/ML [place] SOLN Subcutaneous Inject 14-18 Units into the skin 3 (three) times daily before meals. Based on sliding scale    . LANTUS [place] Subcutaneous Inject 40 Units into the skin at bedtime.     . ADULT MULTIVITAMIN W/MINERALS CH Oral Take 1 tablet by mouth daily.    . OXYCODONE-ACETAMINOPHEN 5-325 MG PO TABS Oral Take 1-2 tablets by mouth every 4 (four) hours as needed. pain 20 tablet 0  . PHENYTOIN SODIUM EXTENDED 100 MG PO CAPS Oral Take 200 mg by mouth at bedtime.      Marland Kitchen SIMVASTATIN 20 MG PO TABS Oral Take 1 tablet (20 mg total) by mouth daily at 6 PM. 30 tablet 0  . TAMSULOSIN HCL 0.4 MG PO CAPS Oral Take 0.4 mg by mouth daily.    . THIAMINE HCL 100 MG PO TABS Oral Take 1 tablet (100 mg total) by mouth daily.      BP 116/85  Pulse 117  Temp 98.5 F (36.9 C) (Oral)  Resp 18  SpO2 97%  Physical Exam  Nursing note and vitals reviewed. Constitutional: He is oriented to person, place, and  time. He appears well-developed and well-nourished. He appears distressed (uncomfortable appearing, asking for help with pain).  HENT:  Head: Normocephalic and atraumatic.  Nose: Nose normal.  Mouth/Throat: Oropharynx is clear and moist.  Eyes: Conjunctivae and EOM are normal. Pupils are equal, round, and reactive to light.  Neck: Normal range of motion. Neck supple. No JVD present. No tracheal deviation present. No thyromegaly present.  Cardiovascular: Normal rate, regular rhythm, normal heart sounds and intact distal pulses.  Exam reveals no gallop and no friction rub.   No murmur heard. Pulmonary/Chest: Effort normal and breath sounds normal. No stridor. No respiratory distress. He has no wheezes. He has no rales. He exhibits no tenderness.  Abdominal: Soft. Bowel sounds are normal. He exhibits no  distension and no mass. There is tenderness (diffuse abdominal pain with tenderness to palpation throughout). There is no rebound and no guarding.  Musculoskeletal: Normal range of motion. He exhibits no edema and no tenderness.  Lymphadenopathy:    He has no cervical adenopathy.  Neurological: He is oriented to person, place, and time. He exhibits normal muscle tone. Coordination normal.  Skin: Skin is dry. No rash noted. No erythema. No pallor.  Psychiatric: He has a normal mood and affect. His behavior is normal. Judgment and thought content normal.    ED Course  Procedures (including critical care time)  Labs Reviewed  CBC WITH DIFFERENTIAL - Abnormal; Notable for the following:    MCHC 36.6 (*)     All other components within normal limits  BASIC METABOLIC PANEL - Abnormal; Notable for the following:    Sodium 131 (*)     Chloride 90 (*)     Glucose, Bld 517 (*)     All other components within normal limits  URINALYSIS, ROUTINE W REFLEX MICROSCOPIC - Abnormal; Notable for the following:    Specific Gravity, Urine 1.041 (*)     Glucose, UA >1000 (*)     All other components within normal limits  GLUCOSE, CAPILLARY - Abnormal; Notable for the following:    Glucose-Capillary 534 (*)     All other components within normal limits  GLUCOSE, CAPILLARY - Abnormal; Notable for the following:    Glucose-Capillary 346 (*)     All other components within normal limits  GLUCOSE, CAPILLARY - Abnormal; Notable for the following:    Glucose-Capillary 292 (*)     All other components within normal limits  LIPASE, BLOOD  URINE MICROSCOPIC-ADD ON   No results found.   1. Hyperglycemia   2. Alcohol abuse   3. Chronic abdominal pain       MDM  51 year old male with history of chronic abdominal pain and alcohol abuse. Patient noted to be hyperglycemic as well.  Patient has received IV fluids, GI cocktail and insulin to correct his hyperglycemia. Patient has been counseled many times  that he needs to stop drinking. He is not interested in detox at this time. He does not have significant elevation in his lipase and has had no vomiting. Patient has been observed drinking out of the fossa in his room despite being told that he should not eat or drink anything at this time. As patient is tolerating by mouth's, feel he is safe to go home        Olivia Mackie, MD 08/16/11 0800

## 2011-08-16 NOTE — ED Notes (Signed)
Per EMS:  Pt has been drinking and smoking marijuana tonight, pt presents with abdominal pain.  Pt has extensive history of pancreatitis.  Pt hasn't taken his insulin in 2 days, CBG 493 in route.  Conscious, alert, oriented x4, nad other than abdominal pain.

## 2011-09-10 ENCOUNTER — Emergency Department (HOSPITAL_COMMUNITY)
Admission: EM | Admit: 2011-09-10 | Discharge: 2011-09-10 | Disposition: A | Discharge status: Home / Self Care | Payer: Self-pay | Attending: Emergency Medicine | Admitting: Emergency Medicine

## 2011-09-10 ENCOUNTER — Encounter (HOSPITAL_COMMUNITY): Payer: Self-pay | Admitting: *Deleted

## 2011-09-10 DIAGNOSIS — Z9089 Acquired absence of other organs: Secondary | ICD-10-CM | POA: Insufficient documentation

## 2011-09-10 DIAGNOSIS — I10 Essential (primary) hypertension: Secondary | ICD-10-CM | POA: Insufficient documentation

## 2011-09-10 DIAGNOSIS — Z87891 Personal history of nicotine dependence: Secondary | ICD-10-CM | POA: Insufficient documentation

## 2011-09-10 DIAGNOSIS — Z794 Long term (current) use of insulin: Secondary | ICD-10-CM | POA: Insufficient documentation

## 2011-09-10 DIAGNOSIS — Z79899 Other long term (current) drug therapy: Secondary | ICD-10-CM | POA: Insufficient documentation

## 2011-09-10 DIAGNOSIS — R569 Unspecified convulsions: Secondary | ICD-10-CM | POA: Insufficient documentation

## 2011-09-10 DIAGNOSIS — E119 Type 2 diabetes mellitus without complications: Secondary | ICD-10-CM | POA: Insufficient documentation

## 2011-09-10 DIAGNOSIS — K2920 Alcoholic gastritis without bleeding: Secondary | ICD-10-CM | POA: Insufficient documentation

## 2011-09-10 DIAGNOSIS — G8929 Other chronic pain: Secondary | ICD-10-CM | POA: Insufficient documentation

## 2011-09-10 DIAGNOSIS — F10929 Alcohol use, unspecified with intoxication, unspecified: Secondary | ICD-10-CM

## 2011-09-10 DIAGNOSIS — R739 Hyperglycemia, unspecified: Secondary | ICD-10-CM

## 2011-09-10 DIAGNOSIS — F1010 Alcohol abuse, uncomplicated: Secondary | ICD-10-CM | POA: Insufficient documentation

## 2011-09-10 LAB — GLUCOSE, CAPILLARY
Glucose-Capillary: 264 mg/dL — ABNORMAL HIGH (ref 70–99)
Glucose-Capillary: 268 mg/dL — ABNORMAL HIGH (ref 70–99)

## 2011-09-10 LAB — COMPREHENSIVE METABOLIC PANEL
Albumin: 3.9 g/dL (ref 3.5–5.2)
BUN: 9 mg/dL (ref 6–23)
Calcium: 9.2 mg/dL (ref 8.4–10.5)
Creatinine, Ser: 0.73 mg/dL (ref 0.50–1.35)
GFR calc Af Amer: >90 mL/min (ref >90)
Potassium: 3.7 mEq/L (ref 3.5–5.1)
Total Protein: 8 g/dL (ref 6.0–8.3)

## 2011-09-10 LAB — CBC
Hemoglobin: 15.5 g/dL (ref 13.0–17.0)
RBC: 5.29 MIL/uL (ref 4.22–5.81)
WBC: 6.2 10*3/uL (ref 4.0–10.5)

## 2011-09-10 LAB — ETHANOL: Alcohol, Ethyl (B): 276 mg/dL — ABNORMAL HIGH (ref 0–11)

## 2011-09-10 MED ORDER — SODIUM CHLORIDE 0.9 % IV BOLUS (SEPSIS)
1000.0000 mL | Freq: Once | INTRAVENOUS | Status: AC
  Administered 2011-09-10: 1000 mL via INTRAVENOUS

## 2011-09-10 NOTE — ED Notes (Signed)
2 unsuccessful attempts to satrt an iv

## 2011-09-10 NOTE — ED Notes (Signed)
The pt does not feel well.  He has been drinking alcohol and he has not been taking his diabetic med

## 2011-09-10 NOTE — ED Provider Notes (Signed)
Patient care assumed at sign out from Dr. Alto Denver. Patient seen and examined by me. His repeat blood sugar decreased to 260s. Patient feels well and is not tremulous. A&O x 3 and sober. Able to d/c  Richardean Canal, MD 09/10/11 [phone]

## 2011-09-10 NOTE — ED Provider Notes (Signed)
History     CSN: [id number]  Arrival date & time 09/10/11  7829   First MD Initiated Contact with Patient 09/10/11 [phone]      No chief complaint on file.   (Consider location/radiation/quality/duration/timing/severity/associated sxs/prior treatment) HPI Patient is a 51 year old male who called EMS today where he was laying on [address] to report that his blood sugar was high. Patient drank a whole bottle of vodka yesterday and also was complaining of epigastric abdominal pain. He does have a history of pancreatitis as well. Per transported EMS team and fingerstick was 259. Patient denies any fevers endorses some nausea. He denies any vomiting. Pain is localized in the epigastrium and patient rated as a 10 out 10. He denies any other symptoms. There is no radiation of his pain. There no other associated or modifying factors. Past Medical History  Diagnosis Date  . Diabetes mellitus   . Hypertension   . Seizures   . Pancreatitis   . Alcohol abuse   . Chronic abdominal pain   . Alcoholism /alcohol abuse     Past Surgical History  Procedure Date  . Appendectomy     Family History  Problem Relation Age of Onset  . Hypertension      History  Substance Use Topics  . Smoking status: Former Smoker    Types: Cigarettes    Quit date: 12/21/1984  . Smokeless tobacco: Not on file  . Alcohol Use: Yes     heavy drinker: binge drinking per pt      Review of Systems  Constitutional: Negative.   HENT: Negative.   Eyes: Negative.   Respiratory: Negative.   Cardiovascular: Negative.   Gastrointestinal: Positive for abdominal pain.  Genitourinary: Negative.   Musculoskeletal: Negative.   Skin: Negative.   Neurological: Negative.   Hematological: Negative.   Psychiatric/Behavioral: Negative.   All other systems reviewed and are negative.    Allergies  Morphine and related  Home Medications   Current Outpatient Rx  Name Route Sig Dispense Refill  . AMLODIPINE  BESYLATE 5 MG PO TABS Oral Take 2 tablets (10 mg total) by mouth daily. 30 tablet 0  . AMYLASE-LIPASE-PROTEASE 66.05-27-73 MU PO CPEP Oral Take 1 capsule by mouth 3 (three) times daily with meals. Usually eats 2 large meals per day    . FENOFIBRATE 160 MG PO TABS Oral Take 1 tablet (160 mg total) by mouth daily with breakfast. 30 tablet 0  . FOLIC ACID 1 MG PO TABS Oral Take 1 tablet (1 mg total) by mouth daily.    Marland Kitchen GABAPENTIN 300 MG PO CAPS Oral Take 300 mg by mouth 3 (three) times daily.      . INSULIN ASPART 100 UNIT/ML [place] SOLN Subcutaneous Inject 14-18 Units into the skin 3 (three) times daily before meals. Based on sliding scale    . LANTUS [place] Subcutaneous Inject 40 Units into the skin at bedtime.     . ADULT MULTIVITAMIN W/MINERALS CH Oral Take 1 tablet by mouth daily.    . OXYCODONE-ACETAMINOPHEN 5-325 MG PO TABS Oral Take 1-2 tablets by mouth every 4 (four) hours as needed. pain 20 tablet 0  . PHENYTOIN SODIUM EXTENDED 100 MG PO CAPS Oral Take 200 mg by mouth at bedtime.      Marland Kitchen SIMVASTATIN 20 MG PO TABS Oral Take 1 tablet (20 mg total) by mouth daily at 6 PM. 30 tablet 0  . TAMSULOSIN HCL 0.4 MG PO CAPS Oral Take 0.4 mg by mouth daily.    Marland Kitchen  THIAMINE HCL 100 MG PO TABS Oral Take 1 tablet (100 mg total) by mouth daily.      Pulse 124  Temp 98.7 F (37.1 C) (Oral)  Resp 20  SpO2 99%  Physical Exam  Nursing note and vitals reviewed. GEN: Well-developed, well-nourished male in no distress HEENT: Atraumatic, normocephalic. Oropharynx clear without erythema EYES: PERRLA BL, no scleral icterus. NECK: Trachea midline, no meningismus CV: regular rate and rhythm. No murmurs, rubs, or gallops PULM: No respiratory distress.  No crackles, wheezes, or rales. GI: soft, mild diffuse tenderness to palpation. No guarding, rebound. + bowel sounds  GU: deferred Neuro: cranial nerves grossly 2-12 intact, no abnormalities of strength or sensation, A and O x 3 MSK: Patient moves all 4 extremities  symmetrically, no deformity, edema, or injury noted Skin: No rashes petechiae, purpura, or jaundice Psych: no abnormality of mood   ED Course  Procedures (including critical care time)  Labs Reviewed  CBC - Abnormal; Notable for the following:    MCHC 36.6 (*)     All other components within normal limits  COMPREHENSIVE METABOLIC PANEL - Abnormal; Notable for the following:    Sodium 132 (*)     Chloride 93 (*)     Glucose, Bld 358 (*)     All other components within normal limits  ETHANOL - Abnormal; Notable for the following:    Alcohol, Ethyl (B) 276 (*)     All other components within normal limits  LIPASE, BLOOD   No results found.   1. Alcohol intoxication   2. Hyperglycemia       MDM  Patient was evaluated by myself. He was oriented that some of alcohol and admitted ingesting a fifth of vodka yesterday. Patient complained of epigastric pain and does have a history of pancreatitis. Laboratory workup was negative for any leukocytosis, significant anemia, elevation of lipase, receiving a liver compromise. Patient did have hyperglycemia and it though he is diabetic he is noncompliant with his medications. Patient did not have an anion gap. Alcohol is 276. Given glucose of 358 as well patient did have a liter normal saline IV bolus ordered. This is currently running. Patient will report her fingerstick following this. Care will be signed out to oncoming physician for final disposition. Anticipate that following improvement in the patient's glucose discharged.        Cyndra Numbers, MD 09/10/11 [phone]

## 2011-09-10 NOTE — ED Notes (Signed)
Per EMS pt has been drinking tonight, and complaining of hyperglycemia. Pt out of lantus.

## 2011-09-10 NOTE — ED Notes (Signed)
CBG was 268. Notified Nurse Shary Key.

## 2011-09-10 NOTE — ED Notes (Signed)
Patient ambulatory of room and informed staff that he is "ready to go." Dr. Alto Denver stated that if patient could walk with a steady gait, then she will discharge him home. Patient ambulated without difficulty. Gait steady.

## 2011-09-10 NOTE — ED Notes (Signed)
Pt called EMS to where he was laying on [address] stating that his blood sugar was high.  CBG of 259 per [facility].  Pt states he drank a whole bottle of vodka yesterday and is now c/o abd pain.  Hx of pancreatitis.

## 2011-09-11 ENCOUNTER — Emergency Department (HOSPITAL_COMMUNITY)
Admission: EM | Admit: 2011-09-11 | Discharge: 2011-09-11 | Disposition: A | Discharge status: Home / Self Care | Payer: Self-pay | Attending: Emergency Medicine | Admitting: Emergency Medicine

## 2011-09-11 DIAGNOSIS — K2920 Alcoholic gastritis without bleeding: Secondary | ICD-10-CM

## 2011-09-11 DIAGNOSIS — F10929 Alcohol use, unspecified with intoxication, unspecified: Secondary | ICD-10-CM

## 2011-09-11 DIAGNOSIS — E119 Type 2 diabetes mellitus without complications: Secondary | ICD-10-CM

## 2011-09-11 LAB — URINE MICROSCOPIC-ADD ON

## 2011-09-11 LAB — CBC WITH DIFFERENTIAL/PLATELET
Basophils Absolute: 0.1 10*3/uL (ref 0.0–0.1)
Basophils Relative: 1 % (ref 0–1)
Eosinophils Absolute: 0.1 10*3/uL (ref 0.0–0.7)
MCH: 29.8 pg (ref 26.0–34.0)
MCHC: 37 g/dL — ABNORMAL HIGH (ref 30.0–36.0)
Neutro Abs: 2.2 10*3/uL (ref 1.7–7.7)
Neutrophils Relative %: 37 % — ABNORMAL LOW (ref 43–77)
RDW: 12.2 % (ref 11.5–15.5)

## 2011-09-11 LAB — ETHANOL: Alcohol, Ethyl (B): 269 mg/dL — ABNORMAL HIGH (ref 0–11)

## 2011-09-11 LAB — COMPREHENSIVE METABOLIC PANEL
AST: 32 U/L (ref 0–37)
Albumin: 3.9 g/dL (ref 3.5–5.2)
Chloride: 99 mEq/L (ref 96–112)
Creatinine, Ser: 0.76 mg/dL (ref 0.50–1.35)
Potassium: 4.5 mEq/L (ref 3.5–5.1)
Total Bilirubin: 0.3 mg/dL (ref 0.3–1.2)
Total Protein: 8.2 g/dL (ref 6.0–8.3)

## 2011-09-11 LAB — URINALYSIS, ROUTINE W REFLEX MICROSCOPIC
Glucose, UA: >1000 mg/dL — AB
Ketones, ur: 15 mg/dL — AB
Leukocytes, UA: NEGATIVE
Nitrite: NEGATIVE
Specific Gravity, Urine: 1.042 — ABNORMAL HIGH (ref 1.005–1.030)
pH: 5 (ref 5.0–8.0)

## 2011-09-11 LAB — LIPASE, BLOOD: Lipase: 12 U/L (ref 11–59)

## 2011-09-11 MED ORDER — SODIUM CHLORIDE 0.9 % IV BOLUS (SEPSIS)
1000.0000 mL | Freq: Once | INTRAVENOUS | Status: AC
  Administered 2011-09-11: 1000 mL via INTRAVENOUS

## 2011-09-11 MED ORDER — DIPHENHYDRAMINE HCL 50 MG/ML IJ SOLN
25.0000 mg | Freq: Once | INTRAMUSCULAR | Status: AC
  Administered 2011-09-11: 25 mg via INTRAVENOUS
  Filled 2011-09-11: qty 1

## 2011-09-11 MED ORDER — HYDROMORPHONE HCL PF 1 MG/ML IJ SOLN
1.0000 mg | Freq: Once | INTRAMUSCULAR | Status: AC
  Administered 2011-09-11: 1 mg via INTRAVENOUS
  Filled 2011-09-11: qty 1

## 2011-09-11 MED ORDER — ONDANSETRON HCL 4 MG/2ML IJ SOLN
4.0000 mg | Freq: Once | INTRAMUSCULAR | Status: AC
  Administered 2011-09-11: 4 mg via INTRAVENOUS
  Filled 2011-09-11: qty 2

## 2011-09-11 NOTE — ED Notes (Signed)
Pt O2 stats dropped to 84% RA with good waveform. Pt placed on 4L nasal canula. Stats now 99%

## 2011-09-11 NOTE — ED Notes (Signed)
Pt. Came to the ED tonight because he was concerned for his sugar being too high. He states that after waiting in the waiting room he began to have abdominal pain. He also reports he has had "alot to drink".

## 2011-09-11 NOTE — ED Provider Notes (Signed)
History     CSN: [id number]  Arrival date & time 09/10/11  2246   First MD Initiated Contact with Patient 09/11/11 0128      Chief Complaint  Patient presents with  . Hyperglycemia  . Alcohol Intoxication    (Consider location/radiation/quality/duration/timing/severity/associated sxs/prior treatment) Patient is a 51 y.o. male presenting with intoxication. The history is provided by the patient.  Alcohol Intoxication  He says he drank about a half liter of vodka today and started having generalized abdominal pain with associated nausea and vomiting. Pain is sharp and across his entire abdomen with radiation to his back. Pain is severe and he rates it 10/10. He's had similar pain in the past related to his pancreas and drinking. Nothing makes his pain better nothing makes it worse. Also, of note, he states that he has unable to afford his Lantus insulin.  Past Medical History  Diagnosis Date  . Diabetes mellitus   . Hypertension   . Seizures   . Pancreatitis   . Alcohol abuse   . Chronic abdominal pain   . Alcoholism /alcohol abuse     Past Surgical History  Procedure Date  . Appendectomy     Family History  Problem Relation Age of Onset  . Hypertension      History  Substance Use Topics  . Smoking status: Former Smoker    Types: Cigarettes    Quit date: 12/21/1984  . Smokeless tobacco: Not on file  . Alcohol Use: Yes     heavy drinker: binge drinking per pt      Review of Systems  All other systems reviewed and are negative.    Allergies  Morphine and related  Home Medications   Current Outpatient Rx  Name Route Sig Dispense Refill  . AMLODIPINE BESYLATE 5 MG PO TABS Oral Take 2 tablets (10 mg total) by mouth daily. 30 tablet 0  . AMYLASE-LIPASE-PROTEASE 66.05-27-73 MU PO CPEP Oral Take 1 capsule by mouth 3 (three) times daily with meals. Usually eats 2 large meals per day    . FENOFIBRATE 160 MG PO TABS Oral Take 1 tablet (160 mg total) by mouth daily  with breakfast. 30 tablet 0  . GABAPENTIN 300 MG PO CAPS Oral Take 300 mg by mouth 3 (three) times daily.      . INSULIN ASPART 100 UNIT/ML [place] SOLN Subcutaneous Inject 14-18 Units into the skin 3 (three) times daily before meals. Based on sliding scale    . LANTUS [place] Subcutaneous Inject 40 Units into the skin at bedtime.     Marland Kitchen PHENYTOIN SODIUM EXTENDED 100 MG PO CAPS Oral Take 200 mg by mouth at bedtime.      Marland Kitchen SIMVASTATIN 20 MG PO TABS Oral Take 1 tablet (20 mg total) by mouth daily at 6 PM. 30 tablet 0    BP 137/104  Pulse 127  Temp 98.1 F (36.7 C) (Oral)  Resp 18  SpO2 100%  Physical Exam  Nursing note and vitals reviewed.  51 year old male who appears uncomfortable. Vital signs are significant for tachycardia with heart rate 127, hypertension with blood pressure 137/104. Oxygen saturation is 100% which is normal. Head is normocephalic and atraumatic. PERRLA, EOMI. Neck is nontender and supple. Back is nontender. Lungs are clear without rales, wheezes, or rhonchi. Heart has regular rate and rhythm which is tachycardic, no murmur heard. Abdomen is soft, flat, with tenderness diffusely. There is no rebound or guarding. Peristalsis is decreased. There no masses or hepatosplenomegaly. Extremities have  full range of motion, no cyanosis or edema. Skin is warm and dry without rash. Neurologic: Mental status is normal, cranial nerves are intact, there are no motor or sensory deficits.  ED Course  Procedures (including critical care time)  Results for orders placed during the hospital encounter of 09/11/11  GLUCOSE, CAPILLARY      Component Value Range   Glucose-Capillary 268 (*) 70 - 99 mg/dL   Comment 1 Documented in Chart     Comment 2 Notify RN    CBC WITH DIFFERENTIAL      Component Value Range   WBC 5.8  4.0 - 10.5 K/uL   RBC 5.16  4.22 - 5.81 MIL/uL   Hemoglobin 15.4  13.0 - 17.0 g/dL   HCT 16.1  09.6 - 04.5 %   MCV 80.6  78.0 - 100.0 fL   MCH 29.8  26.0 - 34.0 pg   MCHC 37.0  (*) 30.0 - 36.0 g/dL   RDW 40.9  81.1 - 91.4 %   Platelets 209  150 - 400 K/uL   Neutrophils Relative 37 (*) 43 - 77 %   Neutro Abs 2.2  1.7 - 7.7 K/uL   Lymphocytes Relative 56 (*) 12 - 46 %   Lymphs Abs 3.2  0.7 - 4.0 K/uL   Monocytes Relative 4  3 - 12 %   Monocytes Absolute 0.2  0.1 - 1.0 K/uL   Eosinophils Relative 2  0 - 5 %   Eosinophils Absolute 0.1  0.0 - 0.7 K/uL   Basophils Relative 1  0 - 1 %   Basophils Absolute 0.1  0.0 - 0.1 K/uL  COMPREHENSIVE METABOLIC PANEL      Component Value Range   Sodium 138  135 - 145 mEq/L   Potassium 4.5  3.5 - 5.1 mEq/L   Chloride 99  96 - 112 mEq/L   CO2 21  19 - 32 mEq/L   Glucose, Bld 242 (*) 70 - 99 mg/dL   BUN 9  6 - 23 mg/dL   Creatinine, Ser 7.82  0.50 - 1.35 mg/dL   Calcium 8.9  8.4 - 95.6 mg/dL   Total Protein 8.2  6.0 - 8.3 g/dL   Albumin 3.9  3.5 - 5.2 g/dL   AST 32  0 - 37 U/L   ALT 21  0 - 53 U/L   Alkaline Phosphatase 103  39 - 117 U/L   Total Bilirubin 0.3  0.3 - 1.2 mg/dL   GFR calc non Af Amer >90  >90 mL/min   GFR calc Af Amer >90  >90 mL/min  LIPASE, BLOOD      Component Value Range   Lipase 12  11 - 59 U/L  URINALYSIS, ROUTINE W REFLEX MICROSCOPIC      Component Value Range   Color, Urine YELLOW  YELLOW   APPearance HAZY (*) CLEAR   Specific Gravity, Urine 1.042 (*) 1.005 - 1.030   pH 5.0  5.0 - 8.0   Glucose, UA >1000 (*) NEGATIVE mg/dL   Hgb urine dipstick NEGATIVE  NEGATIVE   Bilirubin Urine NEGATIVE  NEGATIVE   Ketones, ur 15 (*) NEGATIVE mg/dL   Protein, ur 30 (*) NEGATIVE mg/dL   Urobilinogen, UA 0.2  0.0 - 1.0 mg/dL   Nitrite NEGATIVE  NEGATIVE   Leukocytes, UA NEGATIVE  NEGATIVE  ETHANOL      Component Value Range   Alcohol, Ethyl (B) 269 (*) 0 - 11 mg/dL  URINE MICROSCOPIC-ADD ON  Component Value Range   Squamous Epithelial / LPF RARE  RARE   WBC, UA 0-2  <3 WBC/hpf   RBC / HPF 0-2  <3 RBC/hpf  GLUCOSE, CAPILLARY      Component Value Range   Glucose-Capillary 213 (*) 70 - 99 mg/dL      1. Alcohol intoxication   2. Alcoholic gastritis   3. Diabetes mellitus       MDM  Abdominal pain following alcohol ingestion. Most likely this is alcoholic gastritis with possibility of alcoholic pancreatitis. Charts are reviewed and he has been in the ED to the last 2 days with hyperglycemia and alcohol issues. Also, old radiology reports are reviewed and he has a known pancreatic pseudocyst. He'll be given IV hydration and IV narcotics and antiemetics. At this point, I do not see an indication for CT scanning.  He feels much better after IV fluids, IV diphenhydramine, IV ondansetron, and IV hydromorphone. Blood sugars come back elevated but at a reasonable level. He is discharged with instructions to abstain from alcohol.    Dione Booze, MD 09/11/11 0500

## 2011-10-23 ENCOUNTER — Inpatient Hospital Stay (HOSPITAL_COMMUNITY)
Admission: EM | Admit: 2011-10-23 | Discharge: 2011-10-26 | DRG: 638 | Disposition: A | Discharge status: Home / Self Care | Payer: MEDICAID | Attending: Internal Medicine | Admitting: Internal Medicine

## 2011-10-23 ENCOUNTER — Encounter (HOSPITAL_COMMUNITY): Payer: Self-pay | Admitting: Emergency Medicine

## 2011-10-23 ENCOUNTER — Inpatient Hospital Stay (HOSPITAL_COMMUNITY): Payer: Self-pay

## 2011-10-23 ENCOUNTER — Emergency Department (HOSPITAL_COMMUNITY): Payer: Self-pay

## 2011-10-23 DIAGNOSIS — K863 Pseudocyst of pancreas: Secondary | ICD-10-CM | POA: Diagnosis present

## 2011-10-23 DIAGNOSIS — R109 Unspecified abdominal pain: Secondary | ICD-10-CM | POA: Diagnosis present

## 2011-10-23 DIAGNOSIS — Z87891 Personal history of nicotine dependence: Secondary | ICD-10-CM

## 2011-10-23 DIAGNOSIS — F1020 Alcohol dependence, uncomplicated: Secondary | ICD-10-CM | POA: Diagnosis present

## 2011-10-23 DIAGNOSIS — E871 Hypo-osmolality and hyponatremia: Secondary | ICD-10-CM

## 2011-10-23 DIAGNOSIS — I10 Essential (primary) hypertension: Secondary | ICD-10-CM | POA: Diagnosis present

## 2011-10-23 DIAGNOSIS — G40909 Epilepsy, unspecified, not intractable, without status epilepticus: Secondary | ICD-10-CM | POA: Diagnosis present

## 2011-10-23 DIAGNOSIS — K859 Acute pancreatitis without necrosis or infection, unspecified: Secondary | ICD-10-CM

## 2011-10-23 DIAGNOSIS — E119 Type 2 diabetes mellitus without complications: Secondary | ICD-10-CM | POA: Diagnosis present

## 2011-10-23 DIAGNOSIS — Z9089 Acquired absence of other organs: Secondary | ICD-10-CM

## 2011-10-23 DIAGNOSIS — Z794 Long term (current) use of insulin: Secondary | ICD-10-CM

## 2011-10-23 DIAGNOSIS — K862 Cyst of pancreas: Secondary | ICD-10-CM | POA: Diagnosis present

## 2011-10-23 DIAGNOSIS — Z885 Allergy status to narcotic agent status: Secondary | ICD-10-CM

## 2011-10-23 DIAGNOSIS — E1310 Other specified diabetes mellitus with ketoacidosis without coma: Principal | ICD-10-CM | POA: Diagnosis present

## 2011-10-23 DIAGNOSIS — F1210 Cannabis abuse, uncomplicated: Secondary | ICD-10-CM | POA: Diagnosis present

## 2011-10-23 DIAGNOSIS — E785 Hyperlipidemia, unspecified: Secondary | ICD-10-CM | POA: Diagnosis present

## 2011-10-23 DIAGNOSIS — F1010 Alcohol abuse, uncomplicated: Secondary | ICD-10-CM | POA: Diagnosis present

## 2011-10-23 DIAGNOSIS — Z79899 Other long term (current) drug therapy: Secondary | ICD-10-CM

## 2011-10-23 DIAGNOSIS — R569 Unspecified convulsions: Secondary | ICD-10-CM | POA: Diagnosis present

## 2011-10-23 DIAGNOSIS — G8929 Other chronic pain: Secondary | ICD-10-CM | POA: Diagnosis present

## 2011-10-23 DIAGNOSIS — F10939 Alcohol use, unspecified with withdrawal, unspecified: Secondary | ICD-10-CM | POA: Diagnosis present

## 2011-10-23 DIAGNOSIS — Z23 Encounter for immunization: Secondary | ICD-10-CM

## 2011-10-23 DIAGNOSIS — K861 Other chronic pancreatitis: Secondary | ICD-10-CM | POA: Diagnosis present

## 2011-10-23 DIAGNOSIS — F10239 Alcohol dependence with withdrawal, unspecified: Secondary | ICD-10-CM | POA: Diagnosis not present

## 2011-10-23 DIAGNOSIS — E861 Hypovolemia: Secondary | ICD-10-CM | POA: Diagnosis present

## 2011-10-23 DIAGNOSIS — E1110 Type 2 diabetes mellitus with ketoacidosis without coma: Secondary | ICD-10-CM | POA: Diagnosis present

## 2011-10-23 LAB — BASIC METABOLIC PANEL
BUN: 10 mg/dL (ref 6–23)
BUN: 10 mg/dL (ref 6–23)
CO2: 21 mEq/L (ref 19–32)
Calcium: 9.2 mg/dL (ref 8.4–10.5)
Calcium: 9 mg/dL (ref 8.4–10.5)
Calcium: 9.2 mg/dL (ref 8.4–10.5)
Calcium: 9.1 mg/dL (ref 8.4–10.5)
Creatinine, Ser: 0.77 mg/dL (ref 0.50–1.35)
Creatinine, Ser: 0.76 mg/dL (ref 0.50–1.35)
Creatinine, Ser: 0.76 mg/dL (ref 0.50–1.35)
GFR calc Af Amer: >90 mL/min (ref >90)
GFR calc Af Amer: >90 mL/min (ref >90)
GFR calc non Af Amer: >90 mL/min (ref >90)
GFR calc non Af Amer: >90 mL/min (ref >90)
GFR calc non Af Amer: >90 mL/min (ref >90)
GFR calc non Af Amer: >90 mL/min (ref >90)
Glucose, Bld: 265 mg/dL — ABNORMAL HIGH (ref 70–99)
Glucose, Bld: 161 mg/dL — ABNORMAL HIGH (ref 70–99)
Sodium: 132 mEq/L — ABNORMAL LOW (ref 135–145)

## 2011-10-23 LAB — GLUCOSE, CAPILLARY
Glucose-Capillary: 351 mg/dL — ABNORMAL HIGH (ref 70–99)
Glucose-Capillary: 256 mg/dL — ABNORMAL HIGH (ref 70–99)
Glucose-Capillary: 226 mg/dL — ABNORMAL HIGH (ref 70–99)
Glucose-Capillary: 164 mg/dL — ABNORMAL HIGH (ref 70–99)
Glucose-Capillary: 189 mg/dL — ABNORMAL HIGH (ref 70–99)
Glucose-Capillary: 166 mg/dL — ABNORMAL HIGH (ref 70–99)

## 2011-10-23 LAB — CBC WITH DIFFERENTIAL/PLATELET
Basophils Absolute: 0 10*3/uL (ref 0.0–0.1)
Eosinophils Absolute: 0.1 10*3/uL (ref 0.0–0.7)
HCT: 40.2 % (ref 39.0–52.0)
Lymphs Abs: 2.5 10*3/uL (ref 0.7–4.0)
MCH: 29.8 pg (ref 26.0–34.0)
MCHC: 37.1 g/dL — ABNORMAL HIGH (ref 30.0–36.0)
MCV: 80.4 fL (ref 78.0–100.0)
Monocytes Absolute: 0.2 10*3/uL (ref 0.1–1.0)
Monocytes Relative: 4 % (ref 3–12)
Neutro Abs: 2 10*3/uL (ref 1.7–7.7)
Platelets: 220 10*3/uL (ref 150–400)
RDW: 12.2 % (ref 11.5–15.5)
WBC: 4.8 10*3/uL (ref 4.0–10.5)

## 2011-10-23 LAB — COMPREHENSIVE METABOLIC PANEL
ALT: 13 U/L (ref 0–53)
AST: 14 U/L (ref 0–37)
CO2: 21 mEq/L (ref 19–32)
Calcium: 9.4 mg/dL (ref 8.4–10.5)
Creatinine, Ser: 0.93 mg/dL (ref 0.50–1.35)
GFR calc non Af Amer: >90 mL/min (ref >90)
Sodium: 127 mEq/L — ABNORMAL LOW (ref 135–145)
Total Protein: 7.5 g/dL (ref 6.0–8.3)

## 2011-10-23 LAB — HEMOGLOBIN A1C: Hgb A1c MFr Bld: 13.8 % — ABNORMAL HIGH (ref <5.7)

## 2011-10-23 LAB — CBC
MCH: 29.7 pg (ref 26.0–34.0)
MCHC: 36.9 g/dL — ABNORMAL HIGH (ref 30.0–36.0)
Platelets: 220 10*3/uL (ref 150–400)
RBC: 5.05 MIL/uL (ref 4.22–5.81)

## 2011-10-23 LAB — TRIGLYCERIDES
Triglycerides: 2841 mg/dL — ABNORMAL HIGH (ref <150)
Triglycerides: 2844 mg/dL — ABNORMAL HIGH (ref <150)

## 2011-10-23 LAB — ETHANOL: Alcohol, Ethyl (B): 165 mg/dL — ABNORMAL HIGH (ref 0–11)

## 2011-10-23 MED ORDER — PHENYTOIN SODIUM EXTENDED 100 MG PO CAPS
200.0000 mg | ORAL_CAPSULE | Freq: Every day | ORAL | Status: DC
  Administered 2011-10-23 – 2011-10-25 (×3): 200 mg via ORAL
  Filled 2011-10-23 (×4): qty 2

## 2011-10-23 MED ORDER — SODIUM CHLORIDE 0.9 % IV SOLN
INTRAVENOUS | Status: DC
  Administered 2011-10-23: 150 mL via INTRAVENOUS

## 2011-10-23 MED ORDER — DEXTROSE 50 % IV SOLN: 25.0000 mL | INTRAVENOUS | Status: DC | PRN

## 2011-10-23 MED ORDER — LORAZEPAM 2 MG/ML IJ SOLN
0.0000 mg | Freq: Two times a day (BID) | INTRAMUSCULAR | Status: DC
  Administered 2011-10-25: 1 mg via INTRAVENOUS
  Administered 2011-10-26: 2 mg via INTRAVENOUS
  Filled 2011-10-23: qty 1

## 2011-10-23 MED ORDER — SIMVASTATIN 20 MG PO TABS
20.0000 mg | ORAL_TABLET | Freq: Every day | ORAL | Status: DC
  Administered 2011-10-23 – 2011-10-25 (×3): 20 mg via ORAL
  Filled 2011-10-23 (×4): qty 1

## 2011-10-23 MED ORDER — INSULIN REGULAR BOLUS VIA INFUSION
0.0000 [IU] | Freq: Three times a day (TID) | INTRAVENOUS | Status: DC
  Filled 2011-10-23: qty 10

## 2011-10-23 MED ORDER — SODIUM CHLORIDE 0.9 % IJ SOLN
INTRAMUSCULAR | Status: AC
  Administered 2011-10-23: 3 mL
  Filled 2011-10-23: qty 10

## 2011-10-23 MED ORDER — HYDROMORPHONE HCL PF 1 MG/ML IJ SOLN
1.0000 mg | Freq: Once | INTRAMUSCULAR | Status: AC
  Administered 2011-10-23: 1 mg via INTRAVENOUS
  Filled 2011-10-23: qty 1

## 2011-10-23 MED ORDER — INSULIN ASPART 100 UNIT/ML ~~LOC~~ SOLN
0.0000 [IU] | SUBCUTANEOUS | Status: DC
  Administered 2011-10-23 (×2): 3 [IU] via SUBCUTANEOUS
  Administered 2011-10-23: 2 [IU] via SUBCUTANEOUS
  Administered 2011-10-24 (×3): 5 [IU] via SUBCUTANEOUS

## 2011-10-23 MED ORDER — POTASSIUM CHLORIDE 10 MEQ/100ML IV SOLN
10.0000 meq | INTRAVENOUS | Status: AC
  Administered 2011-10-23 (×2): 10 meq via INTRAVENOUS
  Filled 2011-10-23 (×2): qty 100

## 2011-10-23 MED ORDER — HYDROMORPHONE HCL PF 1 MG/ML IJ SOLN: 1.0000 mg | Freq: Once | INTRAMUSCULAR | Status: DC

## 2011-10-23 MED ORDER — SODIUM CHLORIDE 0.9 % IV SOLN
INTRAVENOUS | Status: AC
  Administered 2011-10-23: 3.9 [IU]/h via INTRAVENOUS
  Administered 2011-10-23: 6.5 [IU]/h via INTRAVENOUS
  Administered 2011-10-23: 5.8 [IU]/h via INTRAVENOUS
  Administered 2011-10-23: 4.2 [IU]/h via INTRAVENOUS
  Administered 2011-10-23: 4.6 [IU]/h via INTRAVENOUS
  Filled 2011-10-23: qty 1

## 2011-10-23 MED ORDER — SODIUM CHLORIDE 0.9 % IV BOLUS (SEPSIS)
1000.0000 mL | Freq: Once | INTRAVENOUS | Status: AC
  Administered 2011-10-23: 1000 mL via INTRAVENOUS

## 2011-10-23 MED ORDER — PANCRELIPASE (LIP-PROT-AMYL) 12000-38000 UNITS PO CPEP
1.0000 | ORAL_CAPSULE | Freq: Three times a day (TID) | ORAL | Status: DC
  Administered 2011-10-23 – 2011-10-26 (×8): 1 via ORAL
  Filled 2011-10-23 (×12): qty 1

## 2011-10-23 MED ORDER — HYDROMORPHONE HCL PF 1 MG/ML IJ SOLN
1.0000 mg | INTRAMUSCULAR | Status: DC | PRN
  Administered 2011-10-23: 2 mg via INTRAVENOUS
  Administered 2011-10-23: 1 mg via INTRAVENOUS
  Administered 2011-10-23 – 2011-10-24 (×3): 2 mg via INTRAVENOUS
  Administered 2011-10-24: 1 mg via INTRAVENOUS
  Administered 2011-10-24: 2 mg via INTRAVENOUS
  Administered 2011-10-25: 1 mg via INTRAVENOUS
  Administered 2011-10-25 (×2): 2 mg via INTRAVENOUS
  Filled 2011-10-23 (×5): qty 2
  Filled 2011-10-23: qty 1
  Filled 2011-10-23 (×4): qty 2

## 2011-10-23 MED ORDER — LORAZEPAM 2 MG/ML IJ SOLN
1.0000 mg | Freq: Four times a day (QID) | INTRAMUSCULAR | Status: AC | PRN
  Filled 2011-10-23 (×2): qty 1

## 2011-10-23 MED ORDER — ONDANSETRON HCL 4 MG/2ML IJ SOLN
4.0000 mg | Freq: Four times a day (QID) | INTRAMUSCULAR | Status: DC | PRN
  Administered 2011-10-23 – 2011-10-24 (×2): 4 mg via INTRAVENOUS
  Filled 2011-10-23 (×2): qty 2

## 2011-10-23 MED ORDER — LORAZEPAM 1 MG PO TABS: 0.0000 mg | ORAL_TABLET | Freq: Two times a day (BID) | ORAL | Status: DC

## 2011-10-23 MED ORDER — IOHEXOL 300 MG/ML  SOLN
100.0000 mL | Freq: Once | INTRAMUSCULAR | Status: AC | PRN
  Administered 2011-10-23: 100 mL via INTRAVENOUS

## 2011-10-23 MED ORDER — VITAMIN B-1 100 MG PO TABS
100.0000 mg | ORAL_TABLET | Freq: Every day | ORAL | Status: DC
  Filled 2011-10-23: qty 1

## 2011-10-23 MED ORDER — LORAZEPAM 1 MG PO TABS
0.0000 mg | ORAL_TABLET | Freq: Four times a day (QID) | ORAL | Status: DC
  Administered 2011-10-23: 4 mg via ORAL
  Filled 2011-10-23: qty 4

## 2011-10-23 MED ORDER — FOLIC ACID 1 MG PO TABS
1.0000 mg | ORAL_TABLET | Freq: Every day | ORAL | Status: DC
  Administered 2011-10-23 – 2011-10-26 (×4): 1 mg via ORAL
  Filled 2011-10-23 (×4): qty 1

## 2011-10-23 MED ORDER — LORAZEPAM 2 MG/ML IJ SOLN
0.0000 mg | Freq: Four times a day (QID) | INTRAMUSCULAR | Status: AC
  Administered 2011-10-23 – 2011-10-25 (×5): 2 mg via INTRAVENOUS
  Filled 2011-10-23 (×4): qty 1

## 2011-10-23 MED ORDER — AMYLASE-LIPASE-PROTEASE 66.4-20-75 MU PO CPEP: 1.0000 | ORAL_CAPSULE | Freq: Three times a day (TID) | ORAL | Status: DC

## 2011-10-23 MED ORDER — HALOPERIDOL LACTATE 5 MG/ML IJ SOLN
INTRAMUSCULAR | Status: AC
  Filled 2011-10-23: qty 1

## 2011-10-23 MED ORDER — ACETAMINOPHEN 650 MG RE SUPP: 650.0000 mg | Freq: Four times a day (QID) | RECTAL | Status: DC | PRN

## 2011-10-23 MED ORDER — LORAZEPAM 2 MG/ML IJ SOLN
1.0000 mg | Freq: Four times a day (QID) | INTRAMUSCULAR | Status: DC | PRN
  Filled 2011-10-23: qty 1

## 2011-10-23 MED ORDER — ENOXAPARIN SODIUM 40 MG/0.4ML ~~LOC~~ SOLN
40.0000 mg | SUBCUTANEOUS | Status: DC
  Administered 2011-10-23 – 2011-10-26 (×4): 40 mg via SUBCUTANEOUS
  Filled 2011-10-23 (×4): qty 0.4

## 2011-10-23 MED ORDER — DEXTROSE-NACL 5-0.45 % IV SOLN: INTRAVENOUS | Status: DC

## 2011-10-23 MED ORDER — ADULT MULTIVITAMIN W/MINERALS CH
1.0000 | ORAL_TABLET | Freq: Every day | ORAL | Status: DC
  Administered 2011-10-23 – 2011-10-26 (×4): 1 via ORAL
  Filled 2011-10-23 (×4): qty 1

## 2011-10-23 MED ORDER — VITAMIN B-1 100 MG PO TABS
100.0000 mg | ORAL_TABLET | Freq: Every day | ORAL | Status: DC
  Administered 2011-10-23 – 2011-10-26 (×4): 100 mg via ORAL
  Filled 2011-10-23 (×4): qty 1

## 2011-10-23 MED ORDER — LORAZEPAM 1 MG PO TABS: 1.0000 mg | ORAL_TABLET | Freq: Four times a day (QID) | ORAL | Status: AC | PRN

## 2011-10-23 MED ORDER — ONDANSETRON HCL 4 MG/2ML IJ SOLN
4.0000 mg | Freq: Once | INTRAMUSCULAR | Status: AC
  Administered 2011-10-23: 4 mg via INTRAVENOUS
  Filled 2011-10-23: qty 2

## 2011-10-23 MED ORDER — GABAPENTIN 300 MG PO CAPS
300.0000 mg | ORAL_CAPSULE | Freq: Three times a day (TID) | ORAL | Status: DC
  Administered 2011-10-23 – 2011-10-26 (×10): 300 mg via ORAL
  Filled 2011-10-23 (×12): qty 1

## 2011-10-23 MED ORDER — PANTOPRAZOLE SODIUM 40 MG IV SOLR
40.0000 mg | Freq: Two times a day (BID) | INTRAVENOUS | Status: DC
  Administered 2011-10-23 – 2011-10-24 (×3): 40 mg via INTRAVENOUS
  Filled 2011-10-23 (×4): qty 40

## 2011-10-23 MED ORDER — THIAMINE HCL 100 MG/ML IJ SOLN
100.0000 mg | Freq: Every day | INTRAMUSCULAR | Status: DC
  Filled 2011-10-23: qty 1

## 2011-10-23 MED ORDER — THIAMINE HCL 100 MG/ML IJ SOLN
100.0000 mg | Freq: Every day | INTRAMUSCULAR | Status: DC
  Filled 2011-10-23 (×3): qty 1

## 2011-10-23 MED ORDER — FENOFIBRATE 160 MG PO TABS
160.0000 mg | ORAL_TABLET | Freq: Every day | ORAL | Status: DC
  Administered 2011-10-24: 160 mg via ORAL
  Filled 2011-10-23 (×3): qty 1

## 2011-10-23 MED ORDER — INSULIN GLARGINE 100 UNIT/ML ~~LOC~~ SOLN
20.0000 [IU] | Freq: Every day | SUBCUTANEOUS | Status: DC
  Administered 2011-10-23 – 2011-10-24 (×2): 20 [IU] via SUBCUTANEOUS

## 2011-10-23 MED ORDER — DEXTROSE-NACL 5-0.45 % IV SOLN
INTRAVENOUS | Status: DC
  Administered 2011-10-23: 125 mL via INTRAVENOUS
  Administered 2011-10-24: 125 mL/h via INTRAVENOUS

## 2011-10-23 MED ORDER — HYDROMORPHONE HCL PF 1 MG/ML IJ SOLN
1.0000 mg | INTRAMUSCULAR | Status: DC | PRN
Start: 2011-10-23 — End: 2011-10-23
  Administered 2011-10-23: 1 mg via INTRAVENOUS
  Filled 2011-10-23: qty 1

## 2011-10-23 MED ORDER — HALOPERIDOL LACTATE 5 MG/ML IJ SOLN
2.5000 mg | Freq: Four times a day (QID) | INTRAMUSCULAR | Status: DC | PRN
  Administered 2011-10-23 (×2): 2.5 mg via INTRAVENOUS
  Filled 2011-10-23: qty 1
  Filled 2011-10-23: qty 0.5

## 2011-10-23 MED ORDER — INSULIN REGULAR HUMAN 100 UNIT/ML IJ SOLN
INTRAMUSCULAR | Status: DC
  Administered 2011-10-23: 2.9 [IU]/h via INTRAVENOUS
  Filled 2011-10-23: qty 1

## 2011-10-23 MED ORDER — ONDANSETRON HCL 4 MG PO TABS: 4.0000 mg | ORAL_TABLET | Freq: Four times a day (QID) | ORAL | Status: DC | PRN

## 2011-10-23 MED ORDER — ACETAMINOPHEN 325 MG PO TABS: 650.0000 mg | ORAL_TABLET | Freq: Four times a day (QID) | ORAL | Status: DC | PRN

## 2011-10-23 MED ORDER — POTASSIUM CHLORIDE 10 MEQ/100ML IV SOLN
10.0000 meq | INTRAVENOUS | Status: AC
  Administered 2011-10-23 (×4): 10 meq via INTRAVENOUS
  Filled 2011-10-23 (×4): qty 100

## 2011-10-23 MED ORDER — SODIUM CHLORIDE 0.9 % IV SOLN
INTRAVENOUS | Status: DC
  Administered 2011-10-23: 07:00:00 via INTRAVENOUS

## 2011-10-23 MED ORDER — LORAZEPAM 1 MG PO TABS: 1.0000 mg | ORAL_TABLET | Freq: Four times a day (QID) | ORAL | Status: DC | PRN

## 2011-10-23 NOTE — ED Notes (Signed)
Per EMS:  Pt reports RUQ LUQ abdominal pain with radiation to his upper back.  Pt states pain has been since yesterday after eating lunch.  Pt admits to drinking a liter of alcohol and has been out of his insulin for  Weeks.  Pt cbg 579 (per EMS)

## 2011-10-23 NOTE — H&P (Addendum)
Gabriel Rush is an 51 y.o. male.    PCP: Former Lobbyist Complaint: Abdominal pain  HPI: This is a 51 year old, African American male, with a past medical history of chronic pancreatitis, diabetes, seizure disorder, who was in his usual state of health till yesterday morning at 9 AM when he started having abdominal pain while eating breakfast. The pain got progressively worse. The pain was located in the upper abdomen. He described it as a cramping pain with tightness which comes and goes. The pain radiated to his chest. He felt like he was having acid reflux. It also radiated to the back of his abdomen. He had 2 episodes of vomiting. He had one episode of diarrhea. The pain was 10 out of 10 in intensity and he did get relief with the pain medications. He also tells me, that he's been out of his Lantus insulin for the past one week. He's been taking higher dose of NovoLog. He used to be seen at [facility] before the clinic closed down. He also feels short of breath because of the abdominal pain. Denies any cough. No history of fever. Patient does admit to drinking vodka 2 days ago, and yesterday. He denies drinking heavily at this time.   Home Medications: Prior to Admission medications   Medication Sig Start Date End Date Taking? Authorizing Provider  amLODipine (NORVASC) 5 MG tablet Take 2 tablets (10 mg total) by mouth daily. 06/07/11 06/06/12 Yes Simbiso Ranga, MD  amylase-lipase-protease (PANGESTYME CN-20) 66.05-27-73 MU per capsule Take 1 capsule by mouth 3 (three) times daily with meals. Usually eats 2 large meals per day   Yes Historical Provider, MD  fenofibrate 160 MG tablet Take 1 tablet (160 mg total) by mouth daily with breakfast. 06/07/11 06/06/12 Yes Simbiso Ranga, MD  gabapentin (NEURONTIN) 300 MG capsule Take 300 mg by mouth 3 (three) times daily.     Yes Historical Provider, MD  insulin aspart (NOVOLOG) 100 UNIT/ML injection Inject 14-18 Units into the skin 3  (three) times daily before meals. Based on sliding scale   Yes Historical Provider, MD  Insulin Glargine (LANTUS ) Inject 40 Units into the skin at bedtime.    Yes Historical Provider, MD  phenytoin (DILANTIN) 100 MG ER capsule Take 200 mg by mouth at bedtime.     Yes Historical Provider, MD  simvastatin (ZOCOR) 20 MG tablet Take 1 tablet (20 mg total) by mouth daily at 6 PM. 06/07/11 06/06/12 Yes Simbiso Ranga, MD   He hasn't taken his medications in at least 1-2 weeks.  Allergies:  Allergies  Allergen Reactions  . Morphine And Related Itching    itching    Past Medical History: Past Medical History  Diagnosis Date  . Diabetes mellitus   . Hypertension   . Seizures   . Pancreatitis   . Alcohol abuse   . Chronic abdominal pain   . Alcoholism /alcohol abuse     Past Surgical History  Procedure Date  . Appendectomy     Social History:  reports that he quit smoking about 26 years ago. His smoking use included Cigarettes. He does not have any smokeless tobacco history on file. He reports that he drinks alcohol. He reports that he uses illicit drugs (Marijuana).  Family History:  Family History  Problem Relation Age of Onset  . Hypertension      There is history of congestive heart failure, and diabetes in the family.  Review of Systems - History obtained  from the patient General ROS: positive for  - chills and fatigue Psychological ROS: negative Ophthalmic ROS: negative ENT ROS: negative Allergy and Immunology ROS: negative Hematological and Lymphatic ROS: negative Endocrine ROS: negative Respiratory ROS: as in hpi Cardiovascular ROS: as in hpi Gastrointestinal ROS: as in hpi Genito-Urinary ROS: no dysuria, trouble voiding, or hematuria Musculoskeletal ROS: negative Neurological ROS: no TIA or stroke symptoms Dermatological ROS: negative  Physical Examination Blood pressure 126/90, pulse 112, temperature 98.7 F (37.1 C), temperature source Oral, resp. rate 18,  SpO2 93.00%.  General appearance: alert, cooperative, appears stated age and no distress Head: Normocephalic, without obvious abnormality, atraumatic Eyes: conjunctivae/corneas clear. PERRL, EOM's intact.  Throat: lips, mucosa, and tongue normal; teeth and gums normal Neck: no adenopathy, no carotid bruit, no JVD, supple, symmetrical, trachea midline and thyroid not enlarged, symmetric, no tenderness/mass/nodules Resp: clear to auscultation bilaterally Cardio: S1, S2 tachy, regular. No murmur, click, rub or gallop GI: Abdomen is soft. There is tenderness diffusely, but mostly in the epigastric area. No masses, or organomegaly. No rebound, rigidity, or guarding. Bowel sounds are present. Extremities: extremities normal, atraumatic, no cyanosis or edema Pulses: 2+ and symmetric Skin: Skin color, texture, turgor normal. No rashes or lesions Lymph nodes: Cervical, supraclavicular, and axillary nodes normal. Neurologic: He is alert and oriented x3. Cranial nerves are intact. No focal neurological deficits are present  Laboratory Data: Results for orders placed during the hospital encounter of 10/23/11 (from the past 48 hour(s))  GLUCOSE, CAPILLARY     Status: Abnormal   Collection Time   10/23/11  4:49 AM      Component Value Range Comment   Glucose-Capillary 509 (*) 70 - 99 mg/dL   CBC WITH DIFFERENTIAL     Status: Abnormal   Collection Time   10/23/11  5:08 AM      Component Value Range Comment   WBC 4.8  4.0 - 10.5 K/uL    RBC 5.00  4.22 - 5.81 MIL/uL    Hemoglobin 14.9  13.0 - 17.0 g/dL    HCT 16.6  06.3 - 01.6 %    MCV 80.4  78.0 - 100.0 fL    MCH 29.8  26.0 - 34.0 pg    MCHC 37.1 (*) 30.0 - 36.0 g/dL    RDW 01.0  93.2 - 35.5 %    Platelets 220  150 - 400 K/uL    Neutrophils Relative 42 (*) 43 - 77 %    Lymphocytes Relative 51 (*) 12 - 46 %    Monocytes Relative 4  3 - 12 %    Eosinophils Relative 2  0 - 5 %    Basophils Relative 1  0 - 1 %    Neutro Abs 2.0  1.7 - 7.7 K/uL     Lymphs Abs 2.5  0.7 - 4.0 K/uL    Monocytes Absolute 0.2  0.1 - 1.0 K/uL    Eosinophils Absolute 0.1  0.0 - 0.7 K/uL    Basophils Absolute 0.0  0.0 - 0.1 K/uL   COMPREHENSIVE METABOLIC PANEL     Status: Abnormal   Collection Time   10/23/11  5:08 AM      Component Value Range Comment   Sodium 127 (*) 135 - 145 mEq/L    Potassium 4.0  3.5 - 5.1 mEq/L    Chloride 88 (*) 96 - 112 mEq/L    CO2 21  19 - 32 mEq/L    Glucose, Bld 473 (*) 70 - 99 mg/dL  BUN 13  6 - 23 mg/dL    Creatinine, Ser 1.61  0.50 - 1.35 mg/dL    Calcium 9.4  8.4 - 09.6 mg/dL    Total Protein 7.5  6.0 - 8.3 g/dL    Albumin 3.6  3.5 - 5.2 g/dL    AST 14  0 - 37 U/L LIPEMIC SPECIMEN   ALT 13  0 - 53 U/L LIPEMIC SPECIMEN   Alkaline Phosphatase 101  39 - 117 U/L    Total Bilirubin 0.2 (*) 0.3 - 1.2 mg/dL    GFR calc non Af Amer >90  >90 mL/min    GFR calc Af Amer >90  >90 mL/min   LIPASE, BLOOD     Status: Normal   Collection Time   10/23/11  5:08 AM      Component Value Range Comment   Lipase 13  11 - 59 U/L   ETHANOL     Status: Abnormal   Collection Time   10/23/11  5:08 AM      Component Value Range Comment   Alcohol, Ethyl (B) 165 (*) 0 - 11 mg/dL   GLUCOSE, CAPILLARY     Status: Abnormal   Collection Time   10/23/11  7:03 AM      Component Value Range Comment   Glucose-Capillary 352 (*) 70 - 99 mg/dL    Comment 1 Notify RN      Comment 2 Documented in Chart       Radiology Reports: Dg Chest Port 1 View  10/23/2011  *RADIOLOGY REPORT*  Clinical Data: Abdominal pain  PORTABLE CHEST - 1 VIEW  Comparison: 10/23/2010  Findings: Hypoaeration with interstitial and vascular crowding. Heart size upper normal.  No pleural effusion.  No definite pneumothorax.  No acute osseous finding.  Visualized portions of the upper abdomen demonstrate no definitive evidence for free intraperitoneal air.  IMPRESSION: Hypoaeration with interstitial and vascular crowding.  No focal consolidation.   Original Report Authenticated By:  Waneta Martins, M.D.     Electrocardiogram: Pending  Assessment/Plan  Principal Problem:  *DKA (diabetic ketoacidoses) Active Problems:  DIABETES MELLITUS, TYPE II, ON INSULIN  ALCOHOL ABUSE  HYPERTENSION, BENIGN ESSENTIAL  PSEUDOCYST, PANCREAS  SEIZURE DISORDER  Chronic pancreatitis  Abdominal pain   #1 abdominal pain with nausea and vomiting: This is most likely chronic pancreatitis. Review of his previous imaging studies showed that he does have a pancreatic pseudocyst. Last CT scan was done in November of 2012. At this time we'll proceed with a repeat CT scan to see the status of the pseudocyst. Pain control will be provided with narcotic agents. His LFTs are normal. He'll be given PPI. The chest pain is probably related to his abdominal issue. Troponin will be checked. EKG will be checked. His tachycardia is probably due to dehydration  #2 poorly controlled diabetes/mild DKA: He's been out of Lantus insulin for over a week now. He'll be given intravenous insulin for control. Once his blood sugars are reasonably well controlled and he is able to tolerate orally he'll be transitioned over to Lantus. Case management will be involved to assist with medications. Diabetes coordinator will also be involved. Will also check HbA1c.  #3 alcohol abuse: He admits to drinking vodka 2 days ago. He also had some yesterday. History regarding use is not completely reliable. So, we will place him on the alcohol withdrawal protocol. Thiamine will be provided. Counseling will be provided.  #4 history of seizure disorder: He is been out of Dilantin  for few weeks. We will resume it in the hospital. He hasn't had any seizures recently.  #5 history of hypertension: Hold his meds hypertensive agents for now. We may resume this prior to discharge.  #6. He is a full code.  #7 hyponatremia and hypochloremia likely due to hypovolemia.  DVT, prophylaxis will be provided.  Further management decisions  will depend on results of further testing and patient's response to treatment  [facility]  [facility] Pager [phone]  10/23/2011, 7:55 AM   Patient went into alcohol withdrawal within a few hours of admission. He remains stable. He required Haldol. His AG has closed. Will transition to SQ Lantus. Continue to monitor closely in stepdown unit.  Gabriel Rush 5:15 PM

## 2011-10-23 NOTE — ED Notes (Signed)
MD at bedside. 

## 2011-10-23 NOTE — ED Provider Notes (Signed)
History     CSN: [id number]  Arrival date & time 10/23/11  2536   First MD Initiated Contact with Patient 10/23/11 0502      Chief Complaint  Patient presents with  . Abdominal Pain  . 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 The patient denies fevers, chills, sore throat, headache, blurred vision, swelling, rashes but he does have intermittent diarrhea.  Patient is a 51 y.o. male presenting with abdominal pain and intoxication. The history is provided by the patient.  Abdominal Pain The primary symptoms of the illness include abdominal pain.  Alcohol Intoxication Associated symptoms include abdominal pain.    Past Medical History  Diagnosis Date  . Diabetes mellitus   . Hypertension   . Seizures   . Pancreatitis   . Alcohol abuse   . Chronic abdominal pain   . Alcoholism /alcohol abuse     Past Surgical History  Procedure Date  . Appendectomy     Family History  Problem Relation Age of Onset  . Hypertension      History  Substance Use Topics  . Smoking status: Former Smoker    Types: Cigarettes    Quit date: 12/21/1984  . Smokeless tobacco: Not on file  . Alcohol Use: Yes     heavy drinker: binge drinking per pt      Review of Systems  Gastrointestinal: Positive for abdominal pain.  All other systems reviewed and are negative.    Allergies  Morphine and related  Home Medications   No current outpatient prescriptions on file.  BP 126/86  Pulse 93  Temp 97.6 F (36.4 C) (Oral)  Resp 12  Ht 5\' 9"  (1.753 m)  Wt 205 lb 7.5 oz (93.2 kg)  BMI 30.34 kg/m2  SpO2  99%  Physical Exam  Nursing note and vitals reviewed. Constitutional: He appears well-developed and well-nourished.       Uncomfortable appearing  HENT:  Head: Normocephalic and atraumatic.  Mouth/Throat: Oropharynx is clear and moist. No oropharyngeal exudate.  Eyes: Conjunctivae normal and EOM are normal. Pupils are equal, round, and reactive to light. Right eye exhibits no discharge. Left eye exhibits no discharge. No scleral icterus.  Neck: Normal range of motion. Neck supple. No JVD present. No thyromegaly present.  Cardiovascular: Regular rhythm, normal heart sounds and intact distal pulses.  Exam reveals no gallop and no friction rub.   No murmur heard.      Mild tachycardia to 110, strong radial artery pulses  Pulmonary/Chest: Effort normal and breath sounds normal. No respiratory distress. He has no wheezes. He has no rales.  Abdominal: Soft. Bowel sounds are normal. He exhibits no distension and no mass. There is tenderness ( Diffuse mild abdominal tenderness, moderate epigastric tenderness with guarding, no masses palpated).  Musculoskeletal: Normal range of motion. He exhibits no edema and no tenderness.  Lymphadenopathy:    He has no cervical adenopathy.  Neurological: He is alert. Coordination normal.  Skin: Skin is warm and dry. No rash noted. No erythema.  Psychiatric: He has a normal mood and affect. His behavior is normal.    ED Course  Procedures (including critical care time)  Labs Reviewed  GLUCOSE, CAPILLARY - Abnormal; Notable for the following:  Glucose-Capillary 509 (*)     All other components within normal limits  CBC WITH DIFFERENTIAL - Abnormal; Notable for the following:    MCHC 37.1 (*)     Neutrophils Relative 42 (*)     Lymphocytes Relative 51 (*)     All other components within normal limits  COMPREHENSIVE METABOLIC PANEL - Abnormal; Notable for the following:    Sodium 127 (*)     Chloride 88 (*)     Glucose, Bld 473 (*)     Total Bilirubin  0.2 (*)     All other components within normal limits  ETHANOL - Abnormal; Notable for the following:    Alcohol, Ethyl (B) 165 (*)     All other components within normal limits  GLUCOSE, CAPILLARY - Abnormal; Notable for the following:    Glucose-Capillary 352 (*)     All other components within normal limits  GLUCOSE, CAPILLARY - Abnormal; Notable for the following:    Glucose-Capillary 351 (*)     All other components within normal limits  TRIGLYCERIDES - Abnormal; Notable for the following:    Triglycerides 2844 (*)     All other components within normal limits  BASIC METABOLIC PANEL - Abnormal; Notable for the following:    Sodium 133 (*)     Chloride 94 (*)     Glucose, Bld 265 (*)     All other components within normal limits  BASIC METABOLIC PANEL - Abnormal; Notable for the following:    Sodium 133 (*)     Potassium 3.4 (*)     Chloride 95 (*)     Glucose, Bld 240 (*)     All other components within normal limits  BASIC METABOLIC PANEL - Abnormal; Notable for the following:    Sodium 133 (*)     Chloride 95 (*)     Glucose, Bld 193 (*)     All other components within normal limits  BASIC METABOLIC PANEL - Abnormal; Notable for the following:    Sodium 132 (*)     Potassium 3.4 (*)     Chloride 95 (*)     Glucose, Bld 161 (*)     All other components within normal limits  CBC - Abnormal; Notable for the following:    MCHC 36.9 (*)     All other components within normal limits  HEMOGLOBIN A1C - Abnormal; Notable for the following:    Hemoglobin A1C 13.8 (*)     Mean Plasma Glucose 349 (*)     All other components within normal limits  GLUCOSE, CAPILLARY - Abnormal; Notable for the following:    Glucose-Capillary 256 (*)     All other components within normal limits  TRIGLYCERIDES - Abnormal; Notable for the following:    Triglycerides 2841 (*)     All other components within normal limits  GLUCOSE, CAPILLARY - Abnormal; Notable for the following:     Glucose-Capillary 226 (*)     All other components within normal limits  GLUCOSE, CAPILLARY - Abnormal; Notable for the following:    Glucose-Capillary 206 (*)     All other components within normal limits  GLUCOSE, CAPILLARY - Abnormal; Notable for the following:    Glucose-Capillary 178 (*)     All other components within normal limits  GLUCOSE, CAPILLARY - Abnormal; Notable for the following:    Glucose-Capillary 164 (*)     All other components within normal limits  COMPREHENSIVE METABOLIC PANEL - Abnormal; Notable for  the following:    Sodium 129 (*)     Potassium 3.4 (*)     Chloride 94 (*)     Glucose, Bld 234 (*)     Albumin 3.2 (*)     All other components within normal limits  CBC - Abnormal; Notable for the following:    HCT 37.0 (*)     All other components within normal limits  LIPASE, BLOOD - Abnormal; Notable for the following:    Lipase 9 (*)     All other components within normal limits  GLUCOSE, CAPILLARY - Abnormal; Notable for the following:    Glucose-Capillary 175 (*)     All other components within normal limits  GLUCOSE, CAPILLARY - Abnormal; Notable for the following:    Glucose-Capillary 189 (*)     All other components within normal limits  GLUCOSE, CAPILLARY - Abnormal; Notable for the following:    Glucose-Capillary 166 (*)     All other components within normal limits  GLUCOSE, CAPILLARY - Abnormal; Notable for the following:    Glucose-Capillary 137 (*)     All other components within normal limits  GLUCOSE, CAPILLARY - Abnormal; Notable for the following:    Glucose-Capillary 178 (*)     All other components within normal limits  GLUCOSE, CAPILLARY - Abnormal; Notable for the following:    Glucose-Capillary 223 (*)     All other components within normal limits  GLUCOSE, CAPILLARY - Abnormal; Notable for the following:    Glucose-Capillary 229 (*)     All other components within normal limits  GLUCOSE, CAPILLARY - Abnormal; Notable for the  following:    Glucose-Capillary 202 (*)     All other components within normal limits  LIPASE, BLOOD  TROPONIN I  MRSA PCR SCREENING   Ct Abdomen Pelvis W Contrast  10/23/2011  *RADIOLOGY REPORT*  Clinical Data: Mid abdominal pain.  History of pancreatic pseudocyst.  CT ABDOMEN AND PELVIS WITH CONTRAST  Technique:  Multidetector CT imaging of the abdomen and pelvis was performed following the standard protocol during bolus administration of intravenous contrast.  Contrast: OMNIPAQUE IOHEXOL 300 MG/ML. Oral contrast was also administered.  Comparison: CT abdomen and pelvis 01/07/2011, 12/15/2010, 10/13/2010, 10/09/2009, 06/23/2009.  Findings: Well-circumscribed, thick-walled cyst arising from the body of the pancreas measuring approximately 2.8 x 4.0 cm, decreased in size from the previous examination in November, 2012 where it measured 3.6 x 4.6 cm.  No peripancreatic edema/inflammation currently.  Mild pancreatic atrophy.  No other focal pancreatic parenchymal abnormalities.  Diffuse hepatic steatosis without focal hepatic parenchymal abnormality, with focal sparing surrounding the gallbladder. Gallbladder unremarkable by CT.  No biliary ductal dilation. Normal-appearing spleen, adrenal glands, and kidneys.  Mild aorto- iliofemoral atherosclerosis without aneurysm.  No significant lymphadenopathy.  Normal-appearing stomach and small bowel.  Moderately large stool burden throughout the colon.  Tortuous and redundant sigmoid colon. Remainder of the colon unremarkable.  Appendix surgically absent. No ascites.  Distended urinary bladder.  Mild median lobe prostate gland enlargement for age, unchanged.  Normal seminal vesicles.  Bone window images demonstrate ankylosis of the right sacroiliac joint and mild central disc protrusion at L4-5 without extrusion. Linear atelectasis is present in the posterior right lower lobe. Heart size normal with right coronary artery calcification.  IMPRESSION:  1.   Pseudocyst arising from the body of the pancreas which has decreased in size since November, 2012, measured above. 2.  No evidence of acute pancreatitis by CT.  Mild pancreatic atrophy. 3.  Diffuse hepatic steatosis with focal sparing surrounding the gallbladder.  No focal hepatic parenchymal abnormality otherwise. 4.  Moderately large stool burden. 5.  Linear atelectasis in the right lower lobe. 6.  Right coronary artery atherosclerosis.  Mild age advanced aorto- iliofemoral atherosclerosis. 7.  Mild median lobe prostate gland enlargement.   Original Report Authenticated By: Arnell Sieving, M.D.    Dg Chest Port 1 View  10/23/2011  *RADIOLOGY REPORT*  Clinical Data: Abdominal pain  PORTABLE CHEST - 1 VIEW  Comparison: 10/23/2010  Findings: Hypoaeration with interstitial and vascular crowding. Heart size upper normal.  No pleural effusion.  No definite pneumothorax.  No acute osseous finding.  Visualized portions of the upper abdomen demonstrate no definitive evidence for free intraperitoneal air.  IMPRESSION: Hypoaeration with interstitial and vascular crowding.  No focal consolidation.   Original Report Authenticated By: Waneta Martins, M.D.      1. DKA (diabetic ketoacidoses)   2. Pancreatitis   3. Alcohol abuse   4. Hyponatremia   5. Abdominal pain   6. Type II or unspecified type diabetes mellitus without mention of complication, not stated as uncontrolled       MDM  The patient has hyperglycemia with a blood sugar of 500 at the bedside, tachycardia and likely has recurrent pancreatitis and/or diabetic ketoacidosis. Labs pending, fluids, meds, will start insulin after laboratory workup finished an initial bolus given.  Initial laboratory workup showed that the patient was hyponatremic, anion gap is present and he was significantly hyperglycemic. In the presence of pancreatitis, alcohol abuse and his hyperglycemic and 9 gap acidosis I suspect that he has early diabetic ketoacidosis.  Due to this do a proper to admit him to the hospital, discussed with the hospitalist will place in a step down unit.  Ongoing tachycardia and hyperglycemia - insulin drip started, CC provided.  CRITICAL CARE Performed by: Vida Roller   Total critical care time: 30  Critical care time was exclusive of separately billable procedures and treating other patients.  Critical care was necessary to treat or prevent imminent or life-threatening deterioration.  Critical care was time spent personally by me on the following activities: development of treatment plan with patient and/or surrogate as well as nursing, discussions with consultants, evaluation of patient's response to treatment, examination of patient, obtaining history from patient or surrogate, ordering and performing treatments and interventions, ordering and review of laboratory studies, ordering and review of radiographic studies, pulse oximetry and re-evaluation of patient's condition.      Vida Roller, MD 10/24/11 [phone]

## 2011-10-24 DIAGNOSIS — K861 Other chronic pancreatitis: Secondary | ICD-10-CM

## 2011-10-24 DIAGNOSIS — F10239 Alcohol dependence with withdrawal, unspecified: Secondary | ICD-10-CM

## 2011-10-24 DIAGNOSIS — F1010 Alcohol abuse, uncomplicated: Secondary | ICD-10-CM

## 2011-10-24 DIAGNOSIS — F10939 Alcohol use, unspecified with withdrawal, unspecified: Secondary | ICD-10-CM

## 2011-10-24 LAB — COMPREHENSIVE METABOLIC PANEL
ALT: 16 U/L (ref 0–53)
Albumin: 3.2 g/dL — ABNORMAL LOW (ref 3.5–5.2)
Alkaline Phosphatase: 90 U/L (ref 39–117)
BUN: 9 mg/dL (ref 6–23)
Potassium: 3.4 mEq/L — ABNORMAL LOW (ref 3.5–5.1)
Sodium: 129 mEq/L — ABNORMAL LOW (ref 135–145)
Total Protein: 6.9 g/dL (ref 6.0–8.3)

## 2011-10-24 LAB — GLUCOSE, CAPILLARY
Glucose-Capillary: 229 mg/dL — ABNORMAL HIGH (ref 70–99)
Glucose-Capillary: 202 mg/dL — ABNORMAL HIGH (ref 70–99)
Glucose-Capillary: 206 mg/dL — ABNORMAL HIGH (ref 70–99)
Glucose-Capillary: 170 mg/dL — ABNORMAL HIGH (ref 70–99)

## 2011-10-24 LAB — CBC
HCT: 37 % — ABNORMAL LOW (ref 39.0–52.0)
MCV: 81.5 fL (ref 78.0–100.0)
RDW: 12.4 % (ref 11.5–15.5)
WBC: 4.5 10*3/uL (ref 4.0–10.5)

## 2011-10-24 MED ORDER — INSULIN ASPART 100 UNIT/ML ~~LOC~~ SOLN
0.0000 [IU] | Freq: Three times a day (TID) | SUBCUTANEOUS | Status: DC
  Administered 2011-10-24: 7 [IU] via SUBCUTANEOUS
  Administered 2011-10-25: 3 [IU] via SUBCUTANEOUS
  Administered 2011-10-25: 4 [IU] via SUBCUTANEOUS
  Administered 2011-10-25 – 2011-10-26 (×2): 3 [IU] via SUBCUTANEOUS

## 2011-10-24 MED ORDER — PANTOPRAZOLE SODIUM 40 MG PO TBEC
40.0000 mg | DELAYED_RELEASE_TABLET | Freq: Every day | ORAL | Status: DC
  Administered 2011-10-25 – 2011-10-26 (×2): 40 mg via ORAL
  Filled 2011-10-24 (×2): qty 1

## 2011-10-24 MED ORDER — INSULIN NPH (HUMAN) (ISOPHANE) 100 UNIT/ML ~~LOC~~ SUSP
20.0000 [IU] | Freq: Two times a day (BID) | SUBCUTANEOUS | Status: DC
  Administered 2011-10-24 – 2011-10-26 (×4): 20 [IU] via SUBCUTANEOUS
  Filled 2011-10-24 (×2): qty 10

## 2011-10-24 MED ORDER — SODIUM CHLORIDE 0.9 % IV SOLN
INTRAVENOUS | Status: DC
  Administered 2011-10-24 – 2011-10-25 (×2): 75 mL/h via INTRAVENOUS

## 2011-10-24 MED ORDER — INSULIN ASPART 100 UNIT/ML ~~LOC~~ SOLN
3.0000 [IU] | Freq: Three times a day (TID) | SUBCUTANEOUS | Status: DC
  Administered 2011-10-25 – 2011-10-26 (×4): 3 [IU] via SUBCUTANEOUS

## 2011-10-24 MED ORDER — SODIUM CHLORIDE 0.9 % IJ SOLN
INTRAMUSCULAR | Status: AC
  Administered 2011-10-24: 10 mL
  Filled 2011-10-24: qty 10

## 2011-10-24 NOTE — Progress Notes (Signed)
Clinical Social Work Department BRIEF PSYCHOSOCIAL ASSESSMENT 10/24/2011  Patient:  Gabriel Rush, Gabriel Rush     Account Number:  [id number]     Admit date:  10/23/2011  Clinical Social Worker:  Dennison Bulla  Date/Time:  10/24/2011 04:30 PM  Referred by:  Physician  Date Referred:  10/24/2011 Referred for  Substance Abuse   Other Referral:   Interview type:  Patient Other interview type:    PSYCHOSOCIAL DATA Living Status:  FAMILY Admitted from facility:   Level of care:   Primary support name:  Pattricia Boss Primary support relationship to patient:  PARENT Degree of support available:   Strong    CURRENT CONCERNS Current Concerns  Substance Abuse   Other Concerns:    SOCIAL WORK ASSESSMENT / PLAN CSW received referral to assist with substance abuse. CSW reviewed chart and met with patient at bedside. No visitors were present.    CSW introduced myself and explained role. Patient reports that he was admitted due to drinking too much and causing his stomach problems. Patient shows good insight in recognizing that his alcohol consumption leads to medical concerns. CSW spoke with patient regarding his use. Patient reports he drinks a half liter of vodka every day. Patient reports that he has been drinking heavily for several years. Patient has tried residential treatment in the past and outpatient treatment such as AA and counseling but reports none of these methods are conducive to his needs. Patient reports that he is not interested in any treatment at this time but does report he feels he can reduce his drinking on his own. Patient reports that he lives with his mom who is a great support system. Patient reports that his friends are negative influences and often encourage him to drink. Patient and CSW discussed triggers to drinking along with changing his environment to reduce or eliminate drinking. Patient struggled with identifying positive coping skills in place of drinking alcohol. Patient  agreeable to be more aware of triggers and avoiding situations where he knows alcohol will be involved. CSW encouraged patient to contact CSW if he changed his mind regarding referrals for substance use. CSW is signing off but available if needed.   Assessment/plan status:  No Further Intervention Required Other assessment/ plan:   SBIRT   Information/referral to community resources:   Patient declined all resources    PATIENT'S/FAMILY'S RESPONSE TO PLAN OF CARE: Patient alert and oriented and engaged throughout assessment. Patient agreeable to discussing substance use but declined all resources. Patient reports failed attempts in the past but reports he will attempt to reduce use at dc.

## 2011-10-24 NOTE — Progress Notes (Signed)
Utilization review completed.  

## 2011-10-24 NOTE — Progress Notes (Addendum)
[facility] Progress Note [place] TEAM 1 - Stepdown/ICU TEAM   Gabriel Rush ZOX:[id number] DOB: [birth year] DOA: 10/23/2011 PCP: Julieanne Manson, MD  Brief narrative: 51 year old male, with a past medical history of chronic pancreatitis, diabetes, seizure disorder, who was in his usual state of health until he started having abdominal pain while eating breakfast. The pain got progressively worse. The pain was located in the upper abdomen. He described it as a cramping pain with tightness which cane and went. The pain radiated to his chest. He felt like he was having acid reflux. It also radiated to the back of his abdomen. He had 2 episodes of vomiting. He had one episode of diarrhea. The pain was 10 out of 10 in intensity and he did get relief with the pain medications. He also reported that he had been out of his Lantus insulin for the past one week. He'd been taking higher doses of NovoLog. He used to be seen at [facility] [address] before the clinic closed down.Patient did admit to drinking vodka 2 days and 1 day prior. He denies drinking heavily.   Assessment/Plan:  abdom pain w/ nausea and vomiting Pain persists by history and on exam today - lipase is normal/low - the patient admits to ongoing use of alcohol - CT scan reveals chronic pseudocysts no evidence of acute inflammation and also reveals pancreatic atrophy - the patient's pain is likely related to chronic pancreatitis with possibility of alcohol related gastritis as well - we will continue empiric proton pump inhibitor and simply monitor the patient - we will attempt to initiate clear liquids  Uncontrolled DM - early DKA CBGs remain poorly controlled the patient is not acidotic - we'll adjust treatment and follow CBGs close  EtOH abuse w/ EtOH withdrawal Have asked clinical social worker counsel patient and educate him on community resources - clinically the patient appears stable at present and he has not required significant  Ativan support today  Seizure d/o Controlled at the present time  HTN Controlled at the present time  Hyponatremia + hypochloremia Likely due to chronic alcoholism plus dehydration - continue to hydrate and follow  Code Status: Full Disposition Plan: The patient is stable for a telemetry bed  Consultants: none  Procedures: None  Antibiotics: None  DVT prophylaxis: lovenox  HPI/Subjective: The patient is somnolent when I entered the room but he is able to be awakened.  He complains of ongoing epigastric and bilateral upper quadrant abdominal pain.  He denies nausea or vomiting.  His nurse reports that his withdrawal symptoms are well-controlled and there's been no evidence of agitation.   Objective: Blood pressure 126/86, pulse 93, temperature 97.7 F (36.5 C), temperature source Oral, resp. rate 12, height 5\' 9"  (1.753 m), weight 93.2 kg (205 lb 7.5 oz), SpO2 99.00%.  Intake/Output Summary (Last 24 hours) at 10/24/11 1215 Last data filed at 10/24/11 0854  Gross per 24 hour  Intake    525 ml  Output   1450 ml  Net   -925 ml     Exam: General: No acute respiratory distress Lungs: Clear to auscultation bilaterally without wheezes or crackles Cardiovascular: Regular rate and rhythm without murmur gallop or rub normal S1 and S2 Abdomen: Tender to palpation in epigastrium and bilateral upper quadrants, no rebound or significant ascites appreciable, bowel sounds are positive, the abdomen is soft Extremities: No significant cyanosis, clubbing, or edema bilateral lower extremities  Data Reviewed: Basic Metabolic Panel:  Lab 10/24/11 8119 10/23/11 1407 10/23/11 1216 10/23/11 1020  10/23/11 0925  NA 129* 132* 133* 133* 133*  K 3.4* 3.4* 3.5 3.4* 3.5  CL 94* 95* 95* 95* 94*  CO2 23 24 21 24 22   GLUCOSE 234* 161* 193* 240* 265*  BUN 9 10 10 10 10   CREATININE 0.65 0.69 0.76 0.76 0.77  CALCIUM 9.2 9.1 9.2 9.0 9.2  MG -- -- -- -- --  PHOS -- -- -- -- --   Liver  Function Tests:  Lab 10/24/11 0515 10/23/11 0508  AST 17 14  ALT 16 13  ALKPHOS 90 101  BILITOT 0.4 0.2*  PROT 6.9 7.5  ALBUMIN 3.2* 3.6    Lab 10/24/11 0515 10/23/11 0508  LIPASE 9* 13  AMYLASE -- --   CBC:  Lab 10/24/11 0515 10/23/11 0925 10/23/11 0508  WBC 4.5 4.6 4.8  NEUTROABS -- -- 2.0  HGB 13.3 15.0 14.9  HCT 37.0* 40.6 40.2  MCV 81.5 80.4 80.4  PLT 187 220 220   Cardiac Enzymes:  Lab 10/23/11 0928  CKTOTAL --  CKMB --  CKMBINDEX --  TROPONINI <0.30   CBG:  Lab 10/24/11 0708 10/24/11 0349 10/23/11 2340 10/23/11 1938 10/23/11 1728  GLUCAP 229* 223* 178* 137* 166*    Recent Results (from the past 240 hour(s))  MRSA PCR SCREENING     Status: Normal   Collection Time   10/23/11  8:30 AM      Component Value Range Status Comment   MRSA by PCR NEGATIVE  NEGATIVE Final      Studies:  Recent x-ray studies have been reviewed in detail by the Attending Physician  Scheduled Meds:  Reviewed in detail by the Attending Physician   Lonia Blood, MD [facility] Office  [phone] Pager [phone]  On-Call/Text Page:      [email or web address]      password [email or web address]  If 7PM-7AM, please contact night-coverage [email or web address] Password [email or web address] 10/24/2011, 12:15 PM   LOS: 1 day

## 2011-10-24 NOTE — Progress Notes (Signed)
Inpatient Diabetes Program Recommendations  AACE/ADA: New Consensus Statement on Inpatient Glycemic Control (2013)  Target Ranges:  Prepandial:   less than 140 mg/dL      Peak postprandial:   less than 180 mg/dL (1-2 hours)      Critically ill patients:  140 - 180 mg/dL   Reason for Visit: MD Consult  Note: Note that patient admitted with DKA after running out of Lantus insulin and trying to take extra Novolog.  PCP was at [facility] and since clinic closed, had no access to Lantus insulin.  Note Care Management consult regarding assistance with obtaining a PCP.  Note that patient has history of pancreatitis, ETOH abuse, and went into ETOH withdrawal after this admission.  It is very difficult to effectively manage diabetes with continued ETOH abuse.  Asked patient if he has ever gone for assistance in ETOH cessation.  He has not.  Asked if he would be interested in going for ETOH counseling and he asked if it would be inpatient.  Told him that I was not sure, but was thinking that Care Management may be able to assist in a referral if he is willing to go.  States that he is not sure if he would be willing or not.  Yentl Verge S. Elsie Lincoln, RN, CNS, CDE  [phone])

## 2011-10-24 NOTE — Care Management Note (Signed)
    Page 1 of 1   10/26/2011     1:53:38 PM   CARE MANAGEMENT NOTE 10/26/2011  Patient:  Gabriel Rush, Gabriel Rush   Account Number:  [id number]  Date Initiated:  10/24/2011  Documentation initiated by:  Donn Pierini  Subjective/Objective Assessment:   Pt admitted with DKA, pancreatitis     Action/Plan:   PTA pt lived at home with parent, independent with ADLs   Anticipated DC Date:  10/26/2011   Anticipated DC Plan:  HOME/SELF CARE      DC Planning Services  CM consult      Choice offered to / List presented to:             Status of service:  In process, will continue to follow Medicare Important Message given?   (If response is "NO", the following Medicare IM given date fields will be blank) Date Medicare IM given:   Date Additional Medicare IM given:    Discharge Disposition:    Per UR Regulation:  Reviewed for med. necessity/level of care/duration of stay  If discussed at Long Length of Stay Meetings, dates discussed:    Comments:  10/26/11-1330-J.Peyten Weare,RN,[name] [phone]      Per [facility] in main pharmacy, patient is elligible for Z[employer] fund. Spoke with Dr. Arthor Captain and requested a seperate prescription for lantus insulin to be written. Agreed. Also notified Rosanne Ashing, bedside RN to send prescription down to main pharmacy to be filled prior to discharge. No further needs identified. Patient being discharged home today.  10/25/11 14:08 Deboah Ladona Ridgel RN, [name] [phone] patient will need to be changed to nph insulin, so that he can afford to purchase it.  Patient was receiving his insulin from [facility] , which was shipped to [facility], but [facility] is closed now.   I called [employer] they states patient  assistance with insulin expired in August he will need  to reapply for patient assistance but will need a primary MD to fill out forms for him.  Patient schedule to go to [place] [facility] on 10/7 at 2 pm, he will take the forms with him to this visit.  Patient will need medication ast with insulin at  discharge, which he is eligible for ast.  10/24/11- 1500- Donn Pierini RN, [name] [phone] Spoke with pt at bedside regarding potential d/c needs. Per conversation pt states that he use to be a [facility] pt- no longer has a PCP. List of community resources for clinics given to pt along with community savings card for prescriptions. Per call made to main pharmacy- pt is eligible for assistance with meds through indigent fund- 3 day supply. Will need 3-day scripts in addition to originals for any meds that are needed at discharge- (can not assist with pain meds). CSW to see pt regarding ETOH. NCM to cont. to follow for d/c needs.

## 2011-10-24 NOTE — Progress Notes (Signed)
Patient has been transfer to 550 via wheelchair. Phone report was given to RN on day shift. Patient aware of the transfer.

## 2011-10-25 LAB — CBC
HCT: 36.5 % — ABNORMAL LOW (ref 39.0–52.0)
Hemoglobin: 13.3 g/dL (ref 13.0–17.0)
MCH: 29.4 pg (ref 26.0–34.0)
MCV: 80.6 fL (ref 78.0–100.0)
Platelets: 153 10*3/uL (ref 150–400)
RBC: 4.53 MIL/uL (ref 4.22–5.81)
WBC: 5.3 10*3/uL (ref 4.0–10.5)

## 2011-10-25 LAB — GLUCOSE, CAPILLARY
Glucose-Capillary: 124 mg/dL — ABNORMAL HIGH (ref 70–99)
Glucose-Capillary: 126 mg/dL — ABNORMAL HIGH (ref 70–99)
Glucose-Capillary: 191 mg/dL — ABNORMAL HIGH (ref 70–99)

## 2011-10-25 LAB — COMPREHENSIVE METABOLIC PANEL
BUN: 4 mg/dL — ABNORMAL LOW (ref 6–23)
Chloride: 99 mEq/L (ref 96–112)
GFR calc non Af Amer: >90 mL/min (ref >90)
Glucose, Bld: 130 mg/dL — ABNORMAL HIGH (ref 70–99)
Potassium: 3.3 mEq/L — ABNORMAL LOW (ref 3.5–5.1)
Total Protein: 6.9 g/dL (ref 6.0–8.3)

## 2011-10-25 MED ORDER — OXYCODONE-ACETAMINOPHEN 5-325 MG PO TABS
1.0000 | ORAL_TABLET | Freq: Four times a day (QID) | ORAL | Status: DC | PRN
  Administered 2011-10-25 – 2011-10-26 (×4): 2 via ORAL
  Filled 2011-10-25 (×4): qty 2

## 2011-10-25 MED ORDER — DIPHENHYDRAMINE HCL 25 MG PO CAPS: 25.0000 mg | ORAL_CAPSULE | Freq: Four times a day (QID) | ORAL | Status: DC | PRN

## 2011-10-25 MED ORDER — OXYCODONE HCL 5 MG PO TABS
5.0000 mg | ORAL_TABLET | ORAL | Status: DC | PRN
  Administered 2011-10-25: 5 mg via ORAL
  Filled 2011-10-25: qty 1

## 2011-10-25 NOTE — Progress Notes (Signed)
PATIENT DETAILS Name: Gabriel Rush Age: 51 y.o. Sex: male Date of Birth: [birth year] Admit Date: 10/23/2011 Admitting Physician Osvaldo Shipper, MD JYN:[email or web address],YQMVHQION, MD  Subjective: Some abd pain-diet being advanced slowly  Assessment/Plan: Principal Problem:  *DKA (diabetic ketoacidoses) -resolved  Active Problems:  DIABETES MELLITUS, TYPE II, ON INSULIN -CB's better -c/w NPH/Novolog and SSI -patient claims he wants to go back on Lantus-? Can he afford it-will have Case Management look at options  abdom pain w/ nausea and vomiting -likely 2/2 flare of chronic pancreatitis -slowly advance diet, c/w Creon -decrease IVF -claims takes percocet at home-will see if pain controlled with oral narcotics  EtOH abuse w/ EtOH withdrawal -counseled by me and by Child psychotherapist -no signs of withdrawal -Ativan per CIWA  Seizure d/o  Controlled at the present time  -c/w Phenytoin  HTN  Controlled at the present time   Hyponatremia  -minimal -now euvolemic-decrease IVF  Dyslipidemia -statins  Disposition: Remain inpatient  DVT Prophylaxis: Prophylactic Lovenox   Code Status: Full code   Procedures:  None  CONSULTS:  None  PHYSICAL EXAM: Vital signs in last 24 hours: Filed Vitals:   10/25/11 0200 10/25/11 0400 10/25/11 0529 10/25/11 0949  BP: 127/84 127/91  132/91  Pulse: 93 94 86 95  Temp: 97.7 F (36.5 C)  97.6 F (36.4 C) 98.5 F (36.9 C)  TempSrc: Oral  Oral Oral  Resp: 16  16 18   Height:      Weight:      SpO2: 94%  94% 94%    Weight change:  Body mass index is 30.34 kg/(m^2).   Gen Exam: Awake and alert with clear speech.   Neck: Supple, No JVD.   Chest: B/L Clear.   CVS: S1 S2 Regular, no murmurs.  Abdomen: soft, BS +, mildly tender in epigastric umblical area, non distended. No rebound or rigidity Extremities: no edema, lower extremities warm to touch. Neurologic: Non Focal.   Skin: No Rash.   Wounds: N/A.    Intake/Output from  previous day:  Intake/Output Summary (Last 24 hours) at 10/25/11 1244 Last data filed at 10/25/11 0241  Gross per 24 hour  Intake      0 ml  Output   1552 ml  Net  -1552 ml     LAB RESULTS: CBC  Lab 10/25/11 0508 10/24/11 0515 10/23/11 0925 10/23/11 0508  WBC 5.3 4.5 4.6 4.8  HGB 13.3 13.3 15.0 14.9  HCT 36.5* 37.0* 40.6 40.2  PLT 153 187 220 220  MCV 80.6 81.5 80.4 80.4  MCH 29.4 29.3 29.7 29.8  MCHC 36.4* 35.9 36.9* 37.1*  RDW 12.2 12.4 12.3 12.2  LYMPHSABS -- -- -- 2.5  MONOABS -- -- -- 0.2  EOSABS -- -- -- 0.1  BASOSABS -- -- -- 0.0  BANDABS -- -- -- --    Chemistries   Lab 10/25/11 0508 10/24/11 0515 10/23/11 1407 10/23/11 1216 10/23/11 1020  NA 133* 129* 132* 133* 133*  K 3.3* 3.4* 3.4* 3.5 3.4*  CL 99 94* 95* 95* 95*  CO2 26 23 24 21 24   GLUCOSE 130* 234* 161* 193* 240*  BUN 4* 9 10 10 10   CREATININE 0.59 0.65 0.69 0.76 0.76  CALCIUM 9.1 9.2 9.1 9.2 9.0  MG 1.6 -- -- -- --    CBG:  Lab 10/25/11 1222 10/25/11 0755 10/24/11 2140 10/24/11 1704 10/24/11 1535  GLUCAP 126* 124* 170* 201* 206*    GFR Estimated Creatinine Clearance: 125.9 ml/min (by C-G formula based on Cr  of 0.59).  Coagulation profile No results found for this basename: INR:5,PROTIME:5 in the last 168 hours  Cardiac Enzymes  Lab 10/23/11 0928  CKMB --  TROPONINI <0.30  MYOGLOBIN --    No components found with this basename: POCBNP:3 No results found for this basename: DDIMER:2 in the last 72 hours  Basename 10/23/11 0925  HGBA1C 13.8*    Basename 10/23/11 0932 10/23/11 0925  CHOL -- --  HDL -- --  LDLCALC -- --  TRIG 2844* 2841*  CHOLHDL -- --  LDLDIRECT -- --   No results found for this basename: TSH,T4TOTAL,FREET3,T3FREE,THYROIDAB in the last 72 hours No results found for this basename: VITAMINB12:2,FOLATE:2,FERRITIN:2,TIBC:2,IRON:2,RETICCTPCT:2 in the last 72 hours  Basename 10/24/11 0515 10/23/11 0508  LIPASE 9* 13  AMYLASE -- --    Urine Studies No results  found for this basename: UACOL:2,UAPR:2,USPG:2,UPH:2,UTP:2,UGL:2,UKET:2,UBIL:2,UHGB:2,UNIT:2,UROB:2,ULEU:2,UEPI:2,UWBC:2,URBC:2,UBAC:2,CAST:2,CRYS:2,UCOM:2,BILUA:2 in the last 72 hours  MICROBIOLOGY: Recent Results (from the past 240 hour(s))  MRSA PCR SCREENING     Status: Normal   Collection Time   10/23/11  8:30 AM      Component Value Range Status Comment   MRSA by PCR NEGATIVE  NEGATIVE Final     RADIOLOGY STUDIES/RESULTS: Ct Abdomen Pelvis W Contrast  10/23/2011  *RADIOLOGY REPORT*  Clinical Data: Mid abdominal pain.  History of pancreatic pseudocyst.  CT ABDOMEN AND PELVIS WITH CONTRAST  Technique:  Multidetector CT imaging of the abdomen and pelvis was performed following the standard protocol during bolus administration of intravenous contrast.  Contrast: OMNIPAQUE IOHEXOL 300 MG/ML. Oral contrast was also administered.  Comparison: CT abdomen and pelvis 01/07/2011, 12/15/2010, 10/13/2010, 10/09/2009, 06/23/2009.  Findings: Well-circumscribed, thick-walled cyst arising from the body of the pancreas measuring approximately 2.8 x 4.0 cm, decreased in size from the previous examination in November, 2012 where it measured 3.6 x 4.6 cm.  No peripancreatic edema/inflammation currently.  Mild pancreatic atrophy.  No other focal pancreatic parenchymal abnormalities.  Diffuse hepatic steatosis without focal hepatic parenchymal abnormality, with focal sparing surrounding the gallbladder. Gallbladder unremarkable by CT.  No biliary ductal dilation. Normal-appearing spleen, adrenal glands, and kidneys.  Mild aorto- iliofemoral atherosclerosis without aneurysm.  No significant lymphadenopathy.  Normal-appearing stomach and small bowel.  Moderately large stool burden throughout the colon.  Tortuous and redundant sigmoid colon. Remainder of the colon unremarkable.  Appendix surgically absent. No ascites.  Distended urinary bladder.  Mild median lobe prostate gland enlargement for age, unchanged.  Normal  seminal vesicles.  Bone window images demonstrate ankylosis of the right sacroiliac joint and mild central disc protrusion at L4-5 without extrusion. Linear atelectasis is present in the posterior right lower lobe. Heart size normal with right coronary artery calcification.  IMPRESSION:  1.  Pseudocyst arising from the body of the pancreas which has decreased in size since November, 2012, measured above. 2.  No evidence of acute pancreatitis by CT.  Mild pancreatic atrophy. 3.  Diffuse hepatic steatosis with focal sparing surrounding the gallbladder.  No focal hepatic parenchymal abnormality otherwise. 4.  Moderately large stool burden. 5.  Linear atelectasis in the right lower lobe. 6.  Right coronary artery atherosclerosis.  Mild age advanced aorto- iliofemoral atherosclerosis. 7.  Mild median lobe prostate gland enlargement.   Original Report Authenticated By: Arnell Sieving, M.D.    Dg Chest Port 1 View  10/23/2011  *RADIOLOGY REPORT*  Clinical Data: Abdominal pain  PORTABLE CHEST - 1 VIEW  Comparison: 10/23/2010  Findings: Hypoaeration with interstitial and vascular crowding. Heart size upper normal.  No pleural effusion.  No definite pneumothorax.  No acute osseous finding.  Visualized portions of the upper abdomen demonstrate no definitive evidence for free intraperitoneal air.  IMPRESSION: Hypoaeration with interstitial and vascular crowding.  No focal consolidation.   Original Report Authenticated By: Waneta Martins, M.D.     MEDICATIONS: Scheduled Meds:   . enoxaparin (LOVENOX) injection  40 mg Subcutaneous Q24H  . folic acid  1 mg Oral Daily  . gabapentin  300 mg Oral TID  . insulin aspart  0-20 Units Subcutaneous TID WC  . insulin aspart  3 Units Subcutaneous TID WC  . insulin NPH  20 Units Subcutaneous BID AC  . lipase/protease/amylase  1 capsule Oral TID WC  . LORazepam  0-4 mg Intravenous Q6H   Followed by  . LORazepam  0-4 mg Intravenous Q12H  . multivitamin with minerals   1 tablet Oral Daily  . pantoprazole  40 mg Oral Q1200  . phenytoin  200 mg Oral QHS  . simvastatin  20 mg Oral q1800  . sodium chloride      . thiamine  100 mg Oral Daily  . DISCONTD: fenofibrate  160 mg Oral Q breakfast  . DISCONTD: insulin aspart  0-15 Units Subcutaneous Q4H  . DISCONTD: insulin glargine  20 Units Subcutaneous Daily  . DISCONTD: pantoprazole (PROTONIX) IV  40 mg Intravenous Q12H  . DISCONTD: thiamine  100 mg Intravenous Daily   Continuous Infusions:   . sodium chloride 75 mL/hr (10/25/11 0447)  . DISCONTD: dextrose 5 % and 0.45% NaCl 125 mL/hr at 10/24/11 1300   PRN Meds:.acetaminophen, acetaminophen, dextrose, haloperidol lactate, HYDROmorphone (DILAUDID) injection, LORazepam, LORazepam, ondansetron (ZOFRAN) IV, ondansetron, oxyCODONE-acetaminophen, DISCONTD: oxyCODONE  Antibiotics: Anti-infectives    None       Jeoffrey Massed, MD  [facility] Pager:[phone]  If 7PM-7AM, please contact night-coverage [email or web address] Password [email or web address] 10/25/2011, 12:44 PM   LOS: 2 days

## 2011-10-25 NOTE — Plan of Care (Signed)
Problem: Phase I Progression Outcomes Goal: Initial discharge plan identified Outcome: Completed/Met Date Met:  10/25/11 To return home

## 2011-10-26 DIAGNOSIS — E1110 Type 2 diabetes mellitus with ketoacidosis without coma: Secondary | ICD-10-CM

## 2011-10-26 LAB — GLUCOSE, CAPILLARY: Glucose-Capillary: 88 mg/dL (ref 70–99)

## 2011-10-26 MED ORDER — INSULIN GLARGINE 100 UNIT/ML ~~LOC~~ SOLN: 30.0000 [IU] | Freq: Every day | SUBCUTANEOUS | Status: DC

## 2011-10-26 MED ORDER — OXYCODONE-ACETAMINOPHEN 5-325 MG PO TABS: 1.0000 | ORAL_TABLET | Freq: Four times a day (QID) | ORAL | Status: DC | PRN

## 2011-10-26 MED ORDER — INFLUENZA VIRUS VACC SPLIT PF IM SUSP
0.5000 mL | Freq: Once | INTRAMUSCULAR | Status: AC
  Administered 2011-10-26: 0.5 mL via INTRAMUSCULAR
  Filled 2011-10-26: qty 0.5

## 2011-10-26 NOTE — Progress Notes (Signed)
Clinical Social Worker received notification that pt needing bus pass at discharge. Clinical Social Worker assisted with transportation by providing bus pass. Pt awaiting insulin from medication assistance. No further social work needs identified at this time. Clinical Social Worker signing off.   Jacklynn Lewis, MSW, LCSWA  Clinical Social Work [phone]

## 2011-10-26 NOTE — Progress Notes (Signed)
Pt. discharge to floor,verbalized understanding of discharged instruction,medication,restriction,diet and follow up appointment.Baseline Vitals sign stable,Pt comfortable,no sign and symptom of distress. 

## 2011-10-26 NOTE — Discharge Summary (Signed)
Physician Discharge Summary  Gabriel Rush:[id number] DOB: May 14, 1960 DOA: 10/23/2011  PCP: Julieanne Manson, MD Used to be with [facility], patient has an appointment with [facility]  Admit date: 10/23/2011 Discharge date: 10/26/2011  Recommendations for Outpatient Follow-up:  1. [facility]  Discharge Diagnoses:  Principal Problem:  *DKA (diabetic ketoacidoses) Active Problems:  DIABETES MELLITUS, TYPE II, ON INSULIN  ALCOHOL ABUSE  HYPERTENSION, BENIGN ESSENTIAL  PSEUDOCYST, PANCREAS  SEIZURE DISORDER  Chronic pancreatitis  Abdominal pain  Alcohol withdrawal syndrome   Discharge Condition: Stable  Diet recommendation: Regular  Filed Weights   10/23/11 0825  Weight: 93.2 kg (205 lb 7.5 oz)    History of present illness:  This is a 51 year old, African American male, with a past medical history of chronic pancreatitis, diabetes, seizure disorder, who was in his usual state of health till yesterday morning at 9 AM when he started having abdominal pain while eating breakfast. The pain got progressively worse. The pain was located in the upper abdomen. He described it as a cramping pain with tightness which comes and goes. The pain radiated to his chest. He felt like he was having acid reflux. It also radiated to the back of his abdomen. He had 2 episodes of vomiting. He had one episode of diarrhea. The pain was 10 out of 10 in intensity and he did get relief with the pain medications. He also tells me, that he's been out of his Lantus insulin for the past one week. He's been taking higher dose of NovoLog. He used to be seen at [facility] before the clinic closed down. He also feels short of breath because of the abdominal pain. Denies any cough. No history of fever. Patient does admit to drinking vodka 2 days ago, and yesterday. He denies drinking heavily at this time.   Hospital Course:   1. Abdominal pain: With nausea and  vomiting, likely to be secondary to flareup of his chronic pancreatitis.. Patient is continued on his Creon and his diet is been slowly advanced. Patient is tolerating regular diet without any difficulty. There is no nausea or vomiting. CT of abdomen and pelvis  was done at the time of admission and showed no evidence of acute pancreatitis. There is small pseudocyst which is improved since last previous study in September.   2. Hyperglycemia and uncontrolled diabetes mellitus type 2: Patient admitted to the hospital with blood sugar level of 439, there was no evidence of acidosis, therefore no DKA or severe hydration to support it HONK state. Patient mentioned that he ran out of his Lantus insulin, he was started on insulin pain in his blood sugar got under control.  3. Diabetes mellitus type 2: Uncontrolled, with hemoglobin A1c of 13.8, this correlates with a mean plasma glucose of 349. That was discussed with him, and he said he had difficulty getting his Lantus. Secondary to losing his primary care physician. Patient to follow as [facility], case manager to help with insulin.  4. Alcohol abuse with history of withdrawal: Counseled extensively by the physician and the social worker. There is no signs of withdrawal, Ativan was written per CIWA protocol, but pain never used.  5. Hypertension: This is controlled with his home medications.  6. Hyponatremia: Slight hyponatremia likely secondary to dehydration, this is improved with IV fluids hydration. His sodium level is 133 at the time of discharge.   Procedures:  None  Consultations:  None  Discharge Exam: Filed Vitals:   10/25/11  1610 10/25/11 1400 10/25/11 2009 10/26/11 0356  BP: 132/91 144/99 138/93 111/74  Pulse: 95 113 104 95  Temp: 98.5 F (36.9 C) 98 F (36.7 C) 97.5 F (36.4 C) 97.9 F (36.6 C)  TempSrc: Oral Oral Oral Oral  Resp: 18 18 18 16   Height:      Weight:      SpO2: 94% 100% 96% 95%   General: Alert and  awake, oriented x3, not in any acute distress. HEENT: anicteric sclera, pupils reactive to light and accommodation, EOMI CVS: S1-S2 clear, no murmur rubs or gallops Chest: clear to auscultation bilaterally, no wheezing, rales or rhonchi Abdomen: soft nontender, nondistended, normal bowel sounds, no organomegaly Extremities: no cyanosis, clubbing or edema noted bilaterally Neuro: Cranial nerves II-XII intact, no focal neurological deficits  Discharge Instructions  Discharge Orders    Future Orders Please Complete By Expires   Diet - low sodium heart healthy      Increase activity slowly          Medication List     As of 10/26/2011 10:16 AM    TAKE these medications         amLODipine 5 MG tablet   Commonly known as: NORVASC   Take 2 tablets (10 mg total) by mouth daily.      amylase-lipase-protease 66.05-27-73 MU per capsule   Commonly known as: PANGESTYME CN-20   Take 1 capsule by mouth 3 (three) times daily with meals. Usually eats 2 large meals per day      fenofibrate 160 MG tablet   Take 1 tablet (160 mg total) by mouth daily with breakfast.      gabapentin 300 MG capsule   Commonly known as: NEURONTIN   Take 300 mg by mouth 3 (three) times daily.      insulin aspart 100 UNIT/ML injection   Commonly known as: novoLOG   Inject 14-18 Units into the skin 3 (three) times daily before meals. Based on sliding scale      insulin glargine 100 UNIT/ML injection   Commonly known as: LANTUS   Inject 30 Units into the skin at bedtime.      oxyCODONE-acetaminophen 5-325 MG per tablet   Commonly known as: PERCOCET/ROXICET   Take 1-2 tablets by mouth every 6 (six) hours as needed.      phenytoin 100 MG ER capsule   Commonly known as: DILANTIN   Take 200 mg by mouth at bedtime.      simvastatin 20 MG tablet   Commonly known as: ZOCOR   Take 1 tablet (20 mg total) by mouth daily at 6 PM.           Follow-up Information    Follow up with [facility], [place]. On 11/14/2011.  (2 pm)    Contact information:   [address] [place] [address] [phone]           The results of significant diagnostics from this hospitalization (including imaging, microbiology, ancillary and laboratory) are listed below for reference.    Significant Diagnostic Studies: Ct Abdomen Pelvis W Contrast  10/23/2011  *RADIOLOGY REPORT*  Clinical Data: Mid abdominal pain.  History of pancreatic pseudocyst.  CT ABDOMEN AND PELVIS WITH CONTRAST  Technique:  Multidetector CT imaging of the abdomen and pelvis was performed following the standard protocol during bolus administration of intravenous contrast.  Contrast: OMNIPAQUE IOHEXOL 300 MG/ML. Oral contrast was also administered.  Comparison: CT abdomen and pelvis 01/07/2011, 12/15/2010, 10/13/2010, 10/09/2009,  06/23/2009.  Findings: Well-circumscribed, thick-walled cyst arising from the body of the pancreas measuring approximately 2.8 x 4.0 cm, decreased in size from the previous examination in November, 2012 where it measured 3.6 x 4.6 cm.  No peripancreatic edema/inflammation currently.  Mild pancreatic atrophy.  No other focal pancreatic parenchymal abnormalities.  Diffuse hepatic steatosis without focal hepatic parenchymal abnormality, with focal sparing surrounding the gallbladder. Gallbladder unremarkable by CT.  No biliary ductal dilation. Normal-appearing spleen, adrenal glands, and kidneys.  Mild aorto- iliofemoral atherosclerosis without aneurysm.  No significant lymphadenopathy.  Normal-appearing stomach and small bowel.  Moderately large stool burden throughout the colon.  Tortuous and redundant sigmoid colon. Remainder of the colon unremarkable.  Appendix surgically absent. No ascites.  Distended urinary bladder.  Mild median lobe prostate gland enlargement for age, unchanged.  Normal seminal vesicles.  Bone window images demonstrate ankylosis of the right sacroiliac joint and mild central disc protrusion at L4-5  without extrusion. Linear atelectasis is present in the posterior right lower lobe. Heart size normal with right coronary artery calcification.  IMPRESSION:  1.  Pseudocyst arising from the body of the pancreas which has decreased in size since November, 2012, measured above. 2.  No evidence of acute pancreatitis by CT.  Mild pancreatic atrophy. 3.  Diffuse hepatic steatosis with focal sparing surrounding the gallbladder.  No focal hepatic parenchymal abnormality otherwise. 4.  Moderately large stool burden. 5.  Linear atelectasis in the right lower lobe. 6.  Right coronary artery atherosclerosis.  Mild age advanced aorto- iliofemoral atherosclerosis. 7.  Mild median lobe prostate gland enlargement.   Original Report Authenticated By: Arnell Sieving, M.D.    Dg Chest Port 1 View  10/23/2011  *RADIOLOGY REPORT*  Clinical Data: Abdominal pain  PORTABLE CHEST - 1 VIEW  Comparison: 10/23/2010  Findings: Hypoaeration with interstitial and vascular crowding. Heart size upper normal.  No pleural effusion.  No definite pneumothorax.  No acute osseous finding.  Visualized portions of the upper abdomen demonstrate no definitive evidence for free intraperitoneal air.  IMPRESSION: Hypoaeration with interstitial and vascular crowding.  No focal consolidation.   Original Report Authenticated By: Waneta Martins, M.D.     Microbiology: Recent Results (from the past 240 hour(s))  MRSA PCR SCREENING     Status: Normal   Collection Time   10/23/11  8:30 AM      Component Value Range Status Comment   MRSA by PCR NEGATIVE  NEGATIVE Final      Labs: Basic Metabolic Panel:  Lab 10/25/11 0865 10/24/11 0515 10/23/11 1407 10/23/11 1216 10/23/11 1020  NA 133* 129* 132* 133* 133*  K 3.3* 3.4* 3.4* 3.5 3.4*  CL 99 94* 95* 95* 95*  CO2 26 23 24 21 24   GLUCOSE 130* 234* 161* 193* 240*  BUN 4* 9 10 10 10   CREATININE 0.59 0.65 0.69 0.76 0.76  CALCIUM 9.1 9.2 9.1 9.2 9.0  MG 1.6 -- -- -- --  PHOS -- -- -- -- --    Liver Function Tests:  Lab 10/25/11 0508 10/24/11 0515 10/23/11 0508  AST 29 17 14   ALT 20 16 13   ALKPHOS 93 90 101  BILITOT 0.3 0.4 0.2*  PROT 6.9 6.9 7.5  ALBUMIN 3.3* 3.2* 3.6    Lab 10/24/11 0515 10/23/11 0508  LIPASE 9* 13  AMYLASE -- --   No results found for this basename: AMMONIA:5 in the last 168 hours CBC:  Lab 10/25/11 0508 10/24/11 0515 10/23/11 0925 10/23/11 0508  WBC 5.3  4.5 4.6 4.8  NEUTROABS -- -- -- 2.0  HGB 13.3 13.3 15.0 14.9  HCT 36.5* 37.0* 40.6 40.2  MCV 80.6 81.5 80.4 80.4  PLT 153 187 220 220   Cardiac Enzymes:  Lab 10/23/11 0928  CKTOTAL --  CKMB --  CKMBINDEX --  TROPONINI <0.30   BNP: BNP (last 3 results) No results found for this basename: PROBNP:3 in the last 8760 hours CBG:  Lab 10/26/11 0732 10/25/11 2112 10/25/11 1722 10/25/11 1222 10/25/11 0755  GLUCAP 133* 223* 191* 126* 124*    Time coordinating discharge: 40 minutes  Signed:  Nakaiya Beddow A  [facility] 10/26/2011, 10:16 AM

## 2011-11-05 ENCOUNTER — Encounter (HOSPITAL_COMMUNITY): Payer: Self-pay | Admitting: *Deleted

## 2011-11-05 ENCOUNTER — Emergency Department (HOSPITAL_COMMUNITY)
Admission: EM | Admit: 2011-11-05 | Discharge: 2011-11-05 | Disposition: A | Discharge status: Home / Self Care | Payer: Self-pay | Attending: Emergency Medicine | Admitting: Emergency Medicine

## 2011-11-05 DIAGNOSIS — F10929 Alcohol use, unspecified with intoxication, unspecified: Secondary | ICD-10-CM

## 2011-11-05 DIAGNOSIS — F1010 Alcohol abuse, uncomplicated: Secondary | ICD-10-CM

## 2011-11-05 DIAGNOSIS — G8929 Other chronic pain: Secondary | ICD-10-CM

## 2011-11-05 DIAGNOSIS — R109 Unspecified abdominal pain: Secondary | ICD-10-CM | POA: Insufficient documentation

## 2011-11-05 DIAGNOSIS — I10 Essential (primary) hypertension: Secondary | ICD-10-CM | POA: Insufficient documentation

## 2011-11-05 DIAGNOSIS — E119 Type 2 diabetes mellitus without complications: Secondary | ICD-10-CM | POA: Insufficient documentation

## 2011-11-05 LAB — LIPASE, BLOOD: Lipase: 7 U/L — ABNORMAL LOW (ref 11–59)

## 2011-11-05 LAB — CBC WITH DIFFERENTIAL/PLATELET
Basophils Relative: 1 % (ref 0–1)
Eosinophils Absolute: 0.1 10*3/uL (ref 0.0–0.7)
Eosinophils Relative: 1 % (ref 0–5)
HCT: 41.5 % (ref 39.0–52.0)
Hemoglobin: 15.2 g/dL (ref 13.0–17.0)
Lymphs Abs: 2.6 10*3/uL (ref 0.7–4.0)
MCH: 29.5 pg (ref 26.0–34.0)
MCHC: 36.6 g/dL — ABNORMAL HIGH (ref 30.0–36.0)
MCV: 80.6 fL (ref 78.0–100.0)
Monocytes Absolute: 0.3 10*3/uL (ref 0.1–1.0)
Monocytes Relative: 5 % (ref 3–12)
Neutrophils Relative %: 46 % (ref 43–77)
RBC: 5.15 MIL/uL (ref 4.22–5.81)

## 2011-11-05 LAB — COMPREHENSIVE METABOLIC PANEL
Albumin: 3.8 g/dL (ref 3.5–5.2)
Alkaline Phosphatase: 106 U/L (ref 39–117)
BUN: 4 mg/dL — ABNORMAL LOW (ref 6–23)
Creatinine, Ser: 0.73 mg/dL (ref 0.50–1.35)
GFR calc Af Amer: >90 mL/min (ref >90)
Glucose, Bld: 267 mg/dL — ABNORMAL HIGH (ref 70–99)
Potassium: 3.6 mEq/L (ref 3.5–5.1)
Total Bilirubin: 0.3 mg/dL (ref 0.3–1.2)
Total Protein: 7.8 g/dL (ref 6.0–8.3)

## 2011-11-05 LAB — URINALYSIS, ROUTINE W REFLEX MICROSCOPIC
Bilirubin Urine: NEGATIVE
Hgb urine dipstick: NEGATIVE
Nitrite: NEGATIVE
Protein, ur: NEGATIVE mg/dL
Urobilinogen, UA: 0.2 mg/dL (ref 0.0–1.0)

## 2011-11-05 LAB — ETHANOL: Alcohol, Ethyl (B): 220 mg/dL — ABNORMAL HIGH (ref 0–11)

## 2011-11-05 MED ORDER — ONDANSETRON HCL 4 MG/2ML IJ SOLN
INTRAMUSCULAR | Status: AC
  Administered 2011-11-05: 4 mg via INTRAVENOUS
  Filled 2011-11-05: qty 2

## 2011-11-05 MED ORDER — METOCLOPRAMIDE HCL 5 MG/ML IJ SOLN
10.0000 mg | Freq: Once | INTRAMUSCULAR | Status: AC
  Administered 2011-11-05: 10 mg via INTRAVENOUS
  Filled 2011-11-05: qty 2

## 2011-11-05 MED ORDER — PANTOPRAZOLE SODIUM 40 MG IV SOLR
40.0000 mg | Freq: Once | INTRAVENOUS | Status: AC
  Administered 2011-11-05: 40 mg via INTRAVENOUS
  Filled 2011-11-05: qty 40

## 2011-11-05 MED ORDER — GI COCKTAIL ~~LOC~~
30.0000 mL | Freq: Once | ORAL | Status: AC
  Administered 2011-11-05: 30 mL via ORAL
  Filled 2011-11-05: qty 30

## 2011-11-05 MED ORDER — ONDANSETRON HCL 4 MG/2ML IJ SOLN
4.0000 mg | Freq: Once | INTRAMUSCULAR | Status: AC
Start: 2011-11-05 — End: 2011-11-05
  Administered 2011-11-05: 4 mg via INTRAVENOUS
  Filled 2011-11-05: qty 2

## 2011-11-05 MED ORDER — THIAMINE HCL 100 MG/ML IJ SOLN
Freq: Once | INTRAVENOUS | Status: AC
  Administered 2011-11-05: 07:00:00 via INTRAVENOUS
  Filled 2011-11-05: qty 1000

## 2011-11-05 MED ORDER — SODIUM CHLORIDE 0.9 % IV BOLUS (SEPSIS)
1000.0000 mL | Freq: Once | INTRAVENOUS | Status: AC
  Administered 2011-11-05: 1000 mL via INTRAVENOUS

## 2011-11-05 MED ORDER — PROMETHAZINE HCL 25 MG/ML IJ SOLN
12.5000 mg | Freq: Once | INTRAMUSCULAR | Status: DC
  Filled 2011-11-05: qty 1

## 2011-11-05 NOTE — ED Provider Notes (Signed)
0600 report received from [place] PA on this well-known 51 year old male patient with frequent visits to the ER.  Patient is positive EtOH and having generalized abdominal pain and nausea and vomiting. No elevation in his lipase today. Blood sugar was 267. IV fluids and a banana bag given in the ER.  Will continue to monitor until he feels better and is able to go home. 9 AM patient is alert and oriented, patient is tolerating by mouth's, patient agrees to take his blood sugar medication when he gets home. Patient is requesting a bus pass for discharge.  Patient is not clinically intoxicated as of now. Patient has tolerated  breakfast. Patient ambulating without difficulty. Patient is ready for discharge.  Remi Haggard, NP 11/05/11 [phone]

## 2011-11-05 NOTE — ED Provider Notes (Signed)
Medical screening examination/treatment/procedure(s) were performed by non-physician practitioner and as supervising physician I was immediately available for consultation/collaboration.  Olivia Mackie, MD 11/05/11 [phone]

## 2011-11-05 NOTE — ED Notes (Signed)
Pt reports that his last drink was around 0200 this morning when he consumed a glass of vodka.

## 2011-11-05 NOTE — ED Provider Notes (Signed)
History     CSN: [id number]  Arrival date & time 11/05/11  0348   First MD Initiated Contact with Patient 11/05/11 [phone]      Chief Complaint  Patient presents with  . Alcohol Intoxication  . Abdominal Pain  . Nausea  . Emesis   HPI  History provided by the patient. Patient is a 51 year old male with history of alcohol addiction, hypertension, diabetes, chronic pancreatitis related to alcohol abuse who presents with complaints of upper abdominal pain. Pain began during the evening yesterday. Patient does admit to heavy alcohol use during the day. Bones are similar to chronic abdominal pains. Patient also reports some increased belching and one episode of vomiting. Patient denies any fever, chills or sweats. Denies any seizure. Denies any chest pain, heart palpitations or shortness of breath. He is not using treatment for his symptoms. Denies any aggravating or alleviating factors.    Past Medical History  Diagnosis Date  . Diabetes mellitus   . Hypertension   . Seizures   . Pancreatitis   . Alcohol abuse   . Chronic abdominal pain   . Alcoholism /alcohol abuse     Past Surgical History  Procedure Date  . Appendectomy     Family History  Problem Relation Age of Onset  . Hypertension      History  Substance Use Topics  . Smoking status: Former Smoker    Types: Cigarettes    Quit date: 12/21/1984  . Smokeless tobacco: Never Used  . Alcohol Use: Yes     heavy drinker: binge drinking per pt      Review of Systems  Constitutional: Negative for fever and chills.  Respiratory: Negative for cough and shortness of breath.   Cardiovascular: Negative for chest pain.  Gastrointestinal: Positive for nausea, vomiting and abdominal pain. Negative for diarrhea and constipation.  All other systems reviewed and are negative.    Allergies  Morphine and related  Home Medications   Current Outpatient Rx  Name Route Sig Dispense Refill  . AMLODIPINE BESYLATE 5 MG PO TABS  Oral Take 2 tablets (10 mg total) by mouth daily. 30 tablet 0  . AMYLASE-LIPASE-PROTEASE 66.05-27-73 MU PO CPEP Oral Take 1 capsule by mouth 3 (three) times daily with meals. Usually eats 2 large meals per day    . FENOFIBRATE 160 MG PO TABS Oral Take 1 tablet (160 mg total) by mouth daily with breakfast. 30 tablet 0  . GABAPENTIN 300 MG PO CAPS Oral Take 300 mg by mouth 3 (three) times daily.      . INSULIN ASPART 100 UNIT/ML [place] SOLN Subcutaneous Inject 14-18 Units into the skin 3 (three) times daily before meals. Based on sliding scale    . INSULIN GLARGINE 100 UNIT/ML [place] SOLN Subcutaneous Inject 30 Units into the skin at bedtime. 10 mL 2  . OXYCODONE-ACETAMINOPHEN 5-325 MG PO TABS Oral Take 1-2 tablets by mouth every 6 (six) hours as needed. 30 tablet 0  . PHENYTOIN SODIUM EXTENDED 100 MG PO CAPS Oral Take 200 mg by mouth at bedtime.      Marland Kitchen SIMVASTATIN 20 MG PO TABS Oral Take 1 tablet (20 mg total) by mouth daily at 6 PM. 30 tablet 0    BP 110/79  Pulse 98  Temp 98.5 F (36.9 C)  Resp 22  SpO2 93%  Physical Exam  Nursing note and vitals reviewed. Constitutional: He is oriented to person, place, and time. He appears well-developed and well-nourished. No distress.  HENT:  Head: Normocephalic.  Cardiovascular: Normal rate and regular rhythm.   No murmur heard. Pulmonary/Chest: Effort normal and breath sounds normal. No respiratory distress. He has no wheezes. He has no rales.  Abdominal: Soft. He exhibits distension. There is tenderness in the left upper quadrant. There is no rebound, no guarding and no CVA tenderness.       Diffuse tenderness  Neurological: He is alert and oriented to person, place, and time.  Skin: Skin is warm.  Psychiatric: He has a normal mood and affect.    ED Course  Procedures   Results for orders placed during the hospital encounter of 11/05/11  GLUCOSE, CAPILLARY      Component Value Range   Glucose-Capillary 253 (*) 70 - 99 mg/dL  CBC WITH  DIFFERENTIAL      Component Value Range   WBC 5.6  4.0 - 10.5 K/uL   RBC 5.15  4.22 - 5.81 MIL/uL   Hemoglobin 15.2  13.0 - 17.0 g/dL   HCT 69.6  29.5 - 28.4 %   MCV 80.6  78.0 - 100.0 fL   MCH 29.5  26.0 - 34.0 pg   MCHC 36.6 (*) 30.0 - 36.0 g/dL   RDW 13.2  44.0 - 10.2 %   Platelets 234  150 - 400 K/uL   Neutrophils Relative 46  43 - 77 %   Neutro Abs 2.6  1.7 - 7.7 K/uL   Lymphocytes Relative 47 (*) 12 - 46 %   Lymphs Abs 2.6  0.7 - 4.0 K/uL   Monocytes Relative 5  3 - 12 %   Monocytes Absolute 0.3  0.1 - 1.0 K/uL   Eosinophils Relative 1  0 - 5 %   Eosinophils Absolute 0.1  0.0 - 0.7 K/uL   Basophils Relative 1  0 - 1 %   Basophils Absolute 0.0  0.0 - 0.1 K/uL  COMPREHENSIVE METABOLIC PANEL      Component Value Range   Sodium 137  135 - 145 mEq/L   Potassium 3.6  3.5 - 5.1 mEq/L   Chloride 98  96 - 112 mEq/L   CO2 23  19 - 32 mEq/L   Glucose, Bld 267 (*) 70 - 99 mg/dL   BUN 4 (*) 6 - 23 mg/dL   Creatinine, Ser 7.25  0.50 - 1.35 mg/dL   Calcium 9.4  8.4 - 36.6 mg/dL   Total Protein 7.8  6.0 - 8.3 g/dL   Albumin 3.8  3.5 - 5.2 g/dL   AST 23  0 - 37 U/L   ALT 24  0 - 53 U/L   Alkaline Phosphatase 106  39 - 117 U/L   Total Bilirubin 0.3  0.3 - 1.2 mg/dL   GFR calc non Af Amer >90  >90 mL/min   GFR calc Af Amer >90  >90 mL/min  LIPASE, BLOOD      Component Value Range   Lipase 7 (*) 11 - 59 U/L  ETHANOL      Component Value Range   Alcohol, Ethyl (B) 220 (*) 0 - 11 mg/dL        1. Alcohol intoxication   2. Alcohol abuse   3. Chronic abdominal pain       MDM  4:05AM patient seen and evaluated. Patient appears in moderate discomfort. No acute distress   Patient with no concerning findings on laboratory tests. Patient with frequent multiple visits to emergency room for similar complaints. Sugar is slightly elevated but no signs for DKA.  Alcohol is also elevated at this time. Will to hydrate patient with IV fluids and wait for him to sober up.  Patient  discussed with Remi Haggard NP.  She will continue to follow patient     Angus Seller, [place] 11/05/11 [phone]

## 2011-11-05 NOTE — ED Notes (Signed)
Pt cannot urinate.  Will try again in 30 minutes. 

## 2011-11-05 NOTE — ED Notes (Signed)
[id number]<AV> Expected date:11/05/11<BR> Expected time: 3:25 AM<BR> Means of arrival:Ambulance<BR> Comments:<BR> Etoh; hx of pancreatitis

## 2011-11-05 NOTE — ED Notes (Signed)
Per [facility], pt called from a parking lot due to diffuse abdominal pain, nausea and vomiting. EMS also reports pt states that he has been drinking alcohol over the last 24 hours. Per EMS, pt has hx of same.

## 2011-11-07 NOTE — ED Provider Notes (Signed)
Medical screening examination/treatment/procedure(s) were performed by non-physician practitioner and as supervising physician I was immediately available for consultation/collaboration.  Mekiah Wahler M Aalyiah Camberos, MD 11/07/11 2149 

## 2011-11-22 ENCOUNTER — Observation Stay (HOSPITAL_COMMUNITY)
Admission: EM | Admit: 2011-11-22 | Discharge: 2011-11-22 | Disposition: A | Discharge status: Home / Self Care | Payer: Self-pay | Attending: Emergency Medicine | Admitting: Emergency Medicine

## 2011-11-22 ENCOUNTER — Emergency Department (HOSPITAL_COMMUNITY): Payer: Self-pay

## 2011-11-22 ENCOUNTER — Encounter (HOSPITAL_COMMUNITY): Payer: Self-pay | Admitting: *Deleted

## 2011-11-22 DIAGNOSIS — R1013 Epigastric pain: Principal | ICD-10-CM | POA: Insufficient documentation

## 2011-11-22 DIAGNOSIS — G40909 Epilepsy, unspecified, not intractable, without status epilepticus: Secondary | ICD-10-CM | POA: Insufficient documentation

## 2011-11-22 DIAGNOSIS — F1020 Alcohol dependence, uncomplicated: Secondary | ICD-10-CM | POA: Insufficient documentation

## 2011-11-22 DIAGNOSIS — F1010 Alcohol abuse, uncomplicated: Secondary | ICD-10-CM

## 2011-11-22 DIAGNOSIS — G8929 Other chronic pain: Secondary | ICD-10-CM | POA: Insufficient documentation

## 2011-11-22 DIAGNOSIS — R739 Hyperglycemia, unspecified: Secondary | ICD-10-CM

## 2011-11-22 DIAGNOSIS — R109 Unspecified abdominal pain: Secondary | ICD-10-CM

## 2011-11-22 DIAGNOSIS — E119 Type 2 diabetes mellitus without complications: Secondary | ICD-10-CM | POA: Insufficient documentation

## 2011-11-22 DIAGNOSIS — I10 Essential (primary) hypertension: Secondary | ICD-10-CM | POA: Insufficient documentation

## 2011-11-22 LAB — CBC WITH DIFFERENTIAL/PLATELET
Basophils Relative: 1 % (ref 0–1)
Eosinophils Absolute: 0 10*3/uL (ref 0.0–0.7)
Hemoglobin: 15.1 g/dL (ref 13.0–17.0)
MCH: 29.5 pg (ref 26.0–34.0)
MCHC: 36.9 g/dL — ABNORMAL HIGH (ref 30.0–36.0)
Monocytes Relative: 4 % (ref 3–12)
Neutrophils Relative %: 48 % (ref 43–77)
Platelets: 214 10*3/uL (ref 150–400)
RDW: 12.2 % (ref 11.5–15.5)

## 2011-11-22 LAB — COMPREHENSIVE METABOLIC PANEL
Albumin: 4 g/dL (ref 3.5–5.2)
Alkaline Phosphatase: 104 U/L (ref 39–117)
BUN: 7 mg/dL (ref 6–23)
Potassium: 3.7 mEq/L (ref 3.5–5.1)
Total Protein: 8 g/dL (ref 6.0–8.3)

## 2011-11-22 LAB — URINE MICROSCOPIC-ADD ON

## 2011-11-22 LAB — URINALYSIS, ROUTINE W REFLEX MICROSCOPIC
Bilirubin Urine: NEGATIVE
Leukocytes, UA: NEGATIVE
Nitrite: NEGATIVE
Specific Gravity, Urine: 1.015 (ref 1.005–1.030)
pH: 5.5 (ref 5.0–8.0)

## 2011-11-22 LAB — LIPASE, BLOOD: Lipase: 9 U/L — ABNORMAL LOW (ref 11–59)

## 2011-11-22 MED ORDER — GI COCKTAIL ~~LOC~~: 30.0000 mL | Freq: Two times a day (BID) | ORAL | Status: DC | PRN

## 2011-11-22 MED ORDER — ONDANSETRON HCL 4 MG/2ML IJ SOLN
4.0000 mg | Freq: Once | INTRAMUSCULAR | Status: AC
  Administered 2011-11-22: 4 mg via INTRAVENOUS

## 2011-11-22 MED ORDER — ONDANSETRON HCL 4 MG/2ML IJ SOLN
4.0000 mg | Freq: Three times a day (TID) | INTRAMUSCULAR | Status: DC | PRN
  Filled 2011-11-22: qty 2

## 2011-11-22 MED ORDER — SODIUM CHLORIDE 0.9 % IV BOLUS (SEPSIS)
1000.0000 mL | Freq: Once | INTRAVENOUS | Status: AC
  Administered 2011-11-22: 1000 mL via INTRAVENOUS

## 2011-11-22 MED ORDER — DEXTROSE-NACL 5-0.45 % IV SOLN: INTRAVENOUS | Status: DC

## 2011-11-22 MED ORDER — HYDROMORPHONE HCL PF 2 MG/ML IJ SOLN
2.0000 mg | Freq: Once | INTRAMUSCULAR | Status: AC
  Administered 2011-11-22: 2 mg via INTRAVENOUS
  Filled 2011-11-22: qty 1

## 2011-11-22 MED ORDER — SODIUM CHLORIDE 0.9 % IV SOLN
1000.0000 mL | Freq: Once | INTRAVENOUS | Status: AC
Start: 2011-11-22 — End: 2011-11-22
  Administered 2011-11-22: 1000 mL via INTRAVENOUS

## 2011-11-22 MED ORDER — SODIUM CHLORIDE 0.9 % IV SOLN: 1000.0000 mL | INTRAVENOUS | Status: DC

## 2011-11-22 MED ORDER — OXYCODONE-ACETAMINOPHEN 5-325 MG PO TABS: ORAL_TABLET | ORAL | Status: DC

## 2011-11-22 MED ORDER — INSULIN ASPART 100 UNIT/ML ~~LOC~~ SOLN
10.0000 [IU] | Freq: Once | SUBCUTANEOUS | Status: AC
  Administered 2011-11-22: 10 [IU] via INTRAVENOUS

## 2011-11-22 MED ORDER — SODIUM CHLORIDE 0.9 % IV SOLN
INTRAVENOUS | Status: DC
  Filled 2011-11-22 (×2): qty 1

## 2011-11-22 NOTE — ED Provider Notes (Signed)
History     CSN: [id number]  Arrival date & time 11/22/11  0272   First MD Initiated Contact with Patient 11/22/11 [phone]      Chief Complaint  Patient presents with  . Abdominal Pain    (Consider location/radiation/quality/duration/timing/severity/associated sxs/prior treatment) HPI Comments: Pt with hx of alcoholism and frequent ER visits for Abd pain and alcohol abuse - presents with recurrent abd pain which is diffuse according to pt but is present most intensely in the epigastrium.  The pain was gradual in onset several hours ago after drinking approximately 1/5 of liquor which she describes as vodka. He denies drinking any alternative forms of toxic alcohols. His symptoms are currently moderate to severe, persistent, worse with palpation and associated with vomiting. He denies any diarrhea, rashes, swelling, chest pain, cough, shortness of breath, headache, blurred vision. He feels like this is a fairly familiar pain to him given his history of pancreatitis.  Patient is a 51 y.o. male presenting with abdominal pain. The history is provided by the patient and medical records.  Abdominal Pain The primary symptoms of the illness include abdominal pain.    Past Medical History  Diagnosis Date  . Diabetes mellitus   . Hypertension   . Seizures   . Pancreatitis   . Alcohol abuse   . Chronic abdominal pain   . Alcoholism /alcohol abuse     Past Surgical History  Procedure Date  . Appendectomy     Family History  Problem Relation Age of Onset  . Hypertension      History  Substance Use Topics  . Smoking status: Former Smoker    Types: Cigarettes    Quit date: 12/21/1984  . Smokeless tobacco: Never Used  . Alcohol Use: Yes     heavy drinker: binge drinking per pt      Review of Systems  Gastrointestinal: Positive for abdominal pain.  All other systems reviewed and are negative.    Allergies  Morphine and related  Home Medications   Current Outpatient Rx    Name Route Sig Dispense Refill  . AMLODIPINE BESYLATE 5 MG PO TABS Oral Take 2 tablets (10 mg total) by mouth daily. 30 tablet 0  . AMYLASE-LIPASE-PROTEASE 66.05-27-73 MU PO CPEP Oral Take 1 capsule by mouth 3 (three) times daily with meals. Usually eats 2 large meals per day    . FENOFIBRATE 160 MG PO TABS Oral Take 1 tablet (160 mg total) by mouth daily with breakfast. 30 tablet 0  . GABAPENTIN 300 MG PO CAPS Oral Take 300 mg by mouth 3 (three) times daily.      . INSULIN ASPART 100 UNIT/ML [place] SOLN Subcutaneous Inject 14-18 Units into the skin 3 (three) times daily before meals. Based on sliding scale    . INSULIN GLARGINE 100 UNIT/ML [place] SOLN Subcutaneous Inject 30 Units into the skin at bedtime. 10 mL 2  . OXYCODONE-ACETAMINOPHEN 5-325 MG PO TABS Oral Take 1-2 tablets by mouth every 6 (six) hours as needed. 30 tablet 0  . PHENYTOIN SODIUM EXTENDED 100 MG PO CAPS Oral Take 200 mg by mouth at bedtime.      Marland Kitchen SIMVASTATIN 20 MG PO TABS Oral Take 1 tablet (20 mg total) by mouth daily at 6 PM. 30 tablet 0  . OXYCODONE-ACETAMINOPHEN 5-325 MG PO TABS  1 to 2 tabs PO q6hrs  PRN for pain 10 tablet 0    BP 133/96  Pulse 95  Temp 98.2 F (36.8 C) (Oral)  Resp  24  SpO2 100%  Physical Exam  Nursing note and vitals reviewed. Constitutional: He appears well-developed and well-nourished. No distress.  HENT:  Head: Normocephalic and atraumatic.  Mouth/Throat: Oropharynx is clear and moist. No oropharyngeal exudate.  Eyes: Conjunctivae normal and EOM are normal. Pupils are equal, round, and reactive to light. Right eye exhibits no discharge. Left eye exhibits no discharge. No scleral icterus.  Neck: Normal range of motion. Neck supple. No JVD present. No thyromegaly present.  Cardiovascular: Regular rhythm, normal heart sounds and intact distal pulses.  Exam reveals no gallop and no friction rub.   No murmur heard.      Tachy to 110  Pulmonary/Chest: Effort normal and breath sounds normal. No  respiratory distress. He has no wheezes. He has no rales.  Abdominal: Soft. Bowel sounds are normal. He exhibits no distension and no mass. There is tenderness ( diffuse mild ttp, focused in the epigastrium, no peritoneal signs, mild guarding to the epigastrium.  no murphy's sign, no pain at [place]B point).  Musculoskeletal: Normal range of motion. He exhibits no edema and no tenderness.  Lymphadenopathy:    He has no cervical adenopathy.  Neurological: He is alert. Coordination normal.  Skin: Skin is warm and dry. No rash noted. No erythema.  Psychiatric: He has a normal mood and affect. His behavior is normal.    ED Course  Procedures (including critical care time)  Labs Reviewed  CBC WITH DIFFERENTIAL - Abnormal; Notable for the following:    MCHC 36.9 (*)     Lymphocytes Relative 47 (*)     All other components within normal limits  COMPREHENSIVE METABOLIC PANEL - Abnormal; Notable for the following:    Glucose, Bld 397 (*)     All other components within normal limits  LIPASE, BLOOD - Abnormal; Notable for the following:    Lipase 9 (*)     All other components within normal limits  URINALYSIS, ROUTINE W REFLEX MICROSCOPIC - Abnormal; Notable for the following:    Glucose, UA >1000 (*)     All other components within normal limits  GLUCOSE, CAPILLARY - Abnormal; Notable for the following:    Glucose-Capillary 281 (*)     All other components within normal limits  GLUCOSE, CAPILLARY - Abnormal; Notable for the following:    Glucose-Capillary 245 (*)     All other components within normal limits  URINE MICROSCOPIC-ADD ON   Dg Chest Port 1 View  11/22/2011  *RADIOLOGY REPORT*  Clinical Data: Shortness of breath for 2 hours.  Evaluate for free air.  PORTABLE CHEST - 1 VIEW  Comparison: 10/23/2011  Findings: Slightly shallow inspiration.  Normal heart size and pulmonary vascularity.  No focal airspace consolidation in the lungs.  No blunting of costophrenic angles.  No pneumothorax.  Mediastinal contours appear intact.  No suggestion of free air under the hemidiaphragms.  No significant change since previous study.  IMPRESSION: No evidence of active pulmonary disease.   Original Report Authenticated By: Marlon Pel, M.D.      1. Abdominal pain   2. Alcohol abuse   3. Hyperglycemia without ketosis       MDM  Recurrent abdominal pain secondary to heavy alcohol intake and history of pancreatitis, labs pending per RN order on arrival in triage, pain medication ordered, x-ray ordered to rule out free air  X-rays reveal no signs of free air under the diaphragm, complete blood count shows no leukocytosis, lipase is normal, liver function is normal, urinalysis  reveals no significant dehydration. The patient's blood sugar improved, he was sent to the observational unit for further evaluation and ongoing treatment of hyperglycemia and abdominal pain. Change of shift, care signed out to physician Dr. Denton Lank and PA [place]  Vida Roller, MD 11/23/11 [phone]

## 2011-11-22 NOTE — ED Notes (Signed)
Pt to ED c/o slow onset abd pain around 2 am.  Pt has hx of pancreatitis and he states this feels the same.  He drank a fifth of vodka today and he feels like that's why he vomited x 2 earlier.  Pt appears in great pain.

## 2011-11-22 NOTE — ED Provider Notes (Signed)
Zakkery Dorian is a 51 y.o. male in CDU on hyperglycemia protocol from pod A. signout from Dr. Hyacinth Meeker as follows: patient with chronic pancreatitis and alcohol abuse. Sugars found to be elevated and patient is tachycardic. We'll try to control pain normalize vital signs and recheck blood glucose. Plan is to discharge unless vitals and sugar are not normalized.   Before patient was transported to the CDU his sugars were under 300 therefore glucose stabilizer protocol was not initiated. Patient remains tachycardic I will try to control his pain and aggressively hydrate him to normalize vital sounds though he can be discharged.  9:18 AM. Patient seen and examined in his room. Patient appears uncomfortable, nondiaphoretic. States he is in severe abdominal pain. Patient is mildly tachycardic in the 100s. Abdominal exam shows diffuse tenderness to palpation of the upper quadrants. Patient states his last alcoholic intake was at 3 AM last night. He has no tongue fasciculations at this time. Plan is aggressive hydration and pain control.   Pt's heart rtate has normalized. Pain is tolerable and he is tolerating by mouth. He will be discharged with pain control and return precautions will be given   Pt verbalized understanding and agrees with care plan. Outpatient follow-up and return precautions given.    New Prescriptions   OXYCODONE-ACETAMINOPHEN (PERCOCET/ROXICET) 5-325 MG PER TABLET    1 to 2 tabs PO q6hrs  PRN for pain    Wynetta Emery, PA-C 11/23/11 1357

## 2011-11-22 NOTE — ED Notes (Signed)
Ice chips given

## 2011-11-22 NOTE — ED Notes (Signed)
Pt presented to ED with abdominal pain.Pt gives the history of pancreatitis and says that he had too much to drink(vodka).

## 2011-11-22 NOTE — ED Notes (Signed)
Bolus still running at this time. Waiting on insulin.

## 2011-11-22 NOTE — ED Notes (Signed)
CBG- 281 

## 2011-11-28 NOTE — ED Provider Notes (Signed)
Medical screening examination/treatment/procedure(s) were performed by non-physician practitioner and as supervising physician I was immediately available for consultation/collaboration.   Suzi Roots, MD 11/28/11 [phone]

## 2012-01-15 IMAGING — CR DG CHEST 2V
2 series · 2 of 2 positions shown · non-contrast
Comparison: 10/02/2010

CLINICAL DATA: Abdominal pain, chest pain, hypertension, diabetes,
former smoker

CHEST - 2 VIEW

[w chest pa]
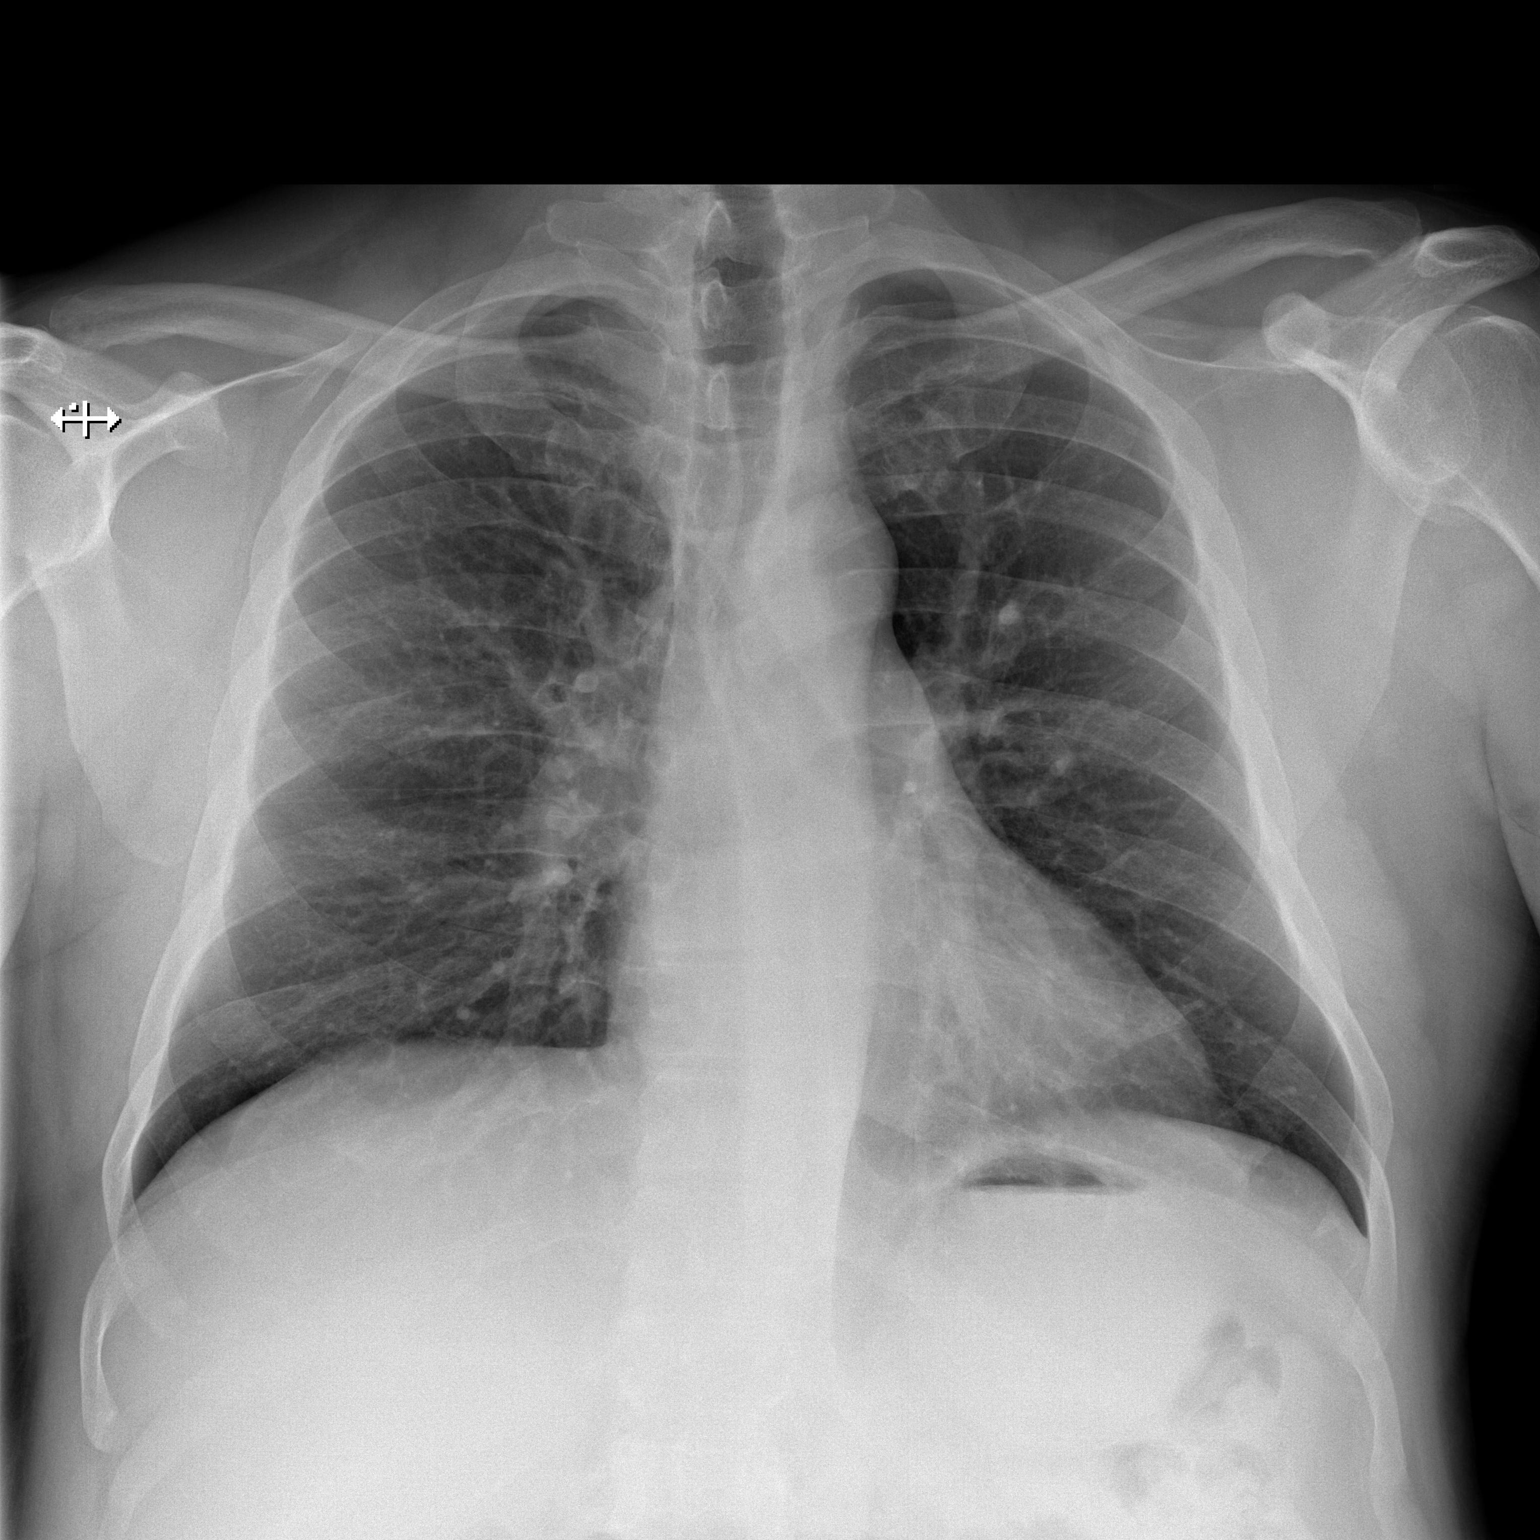

[w chest lat]
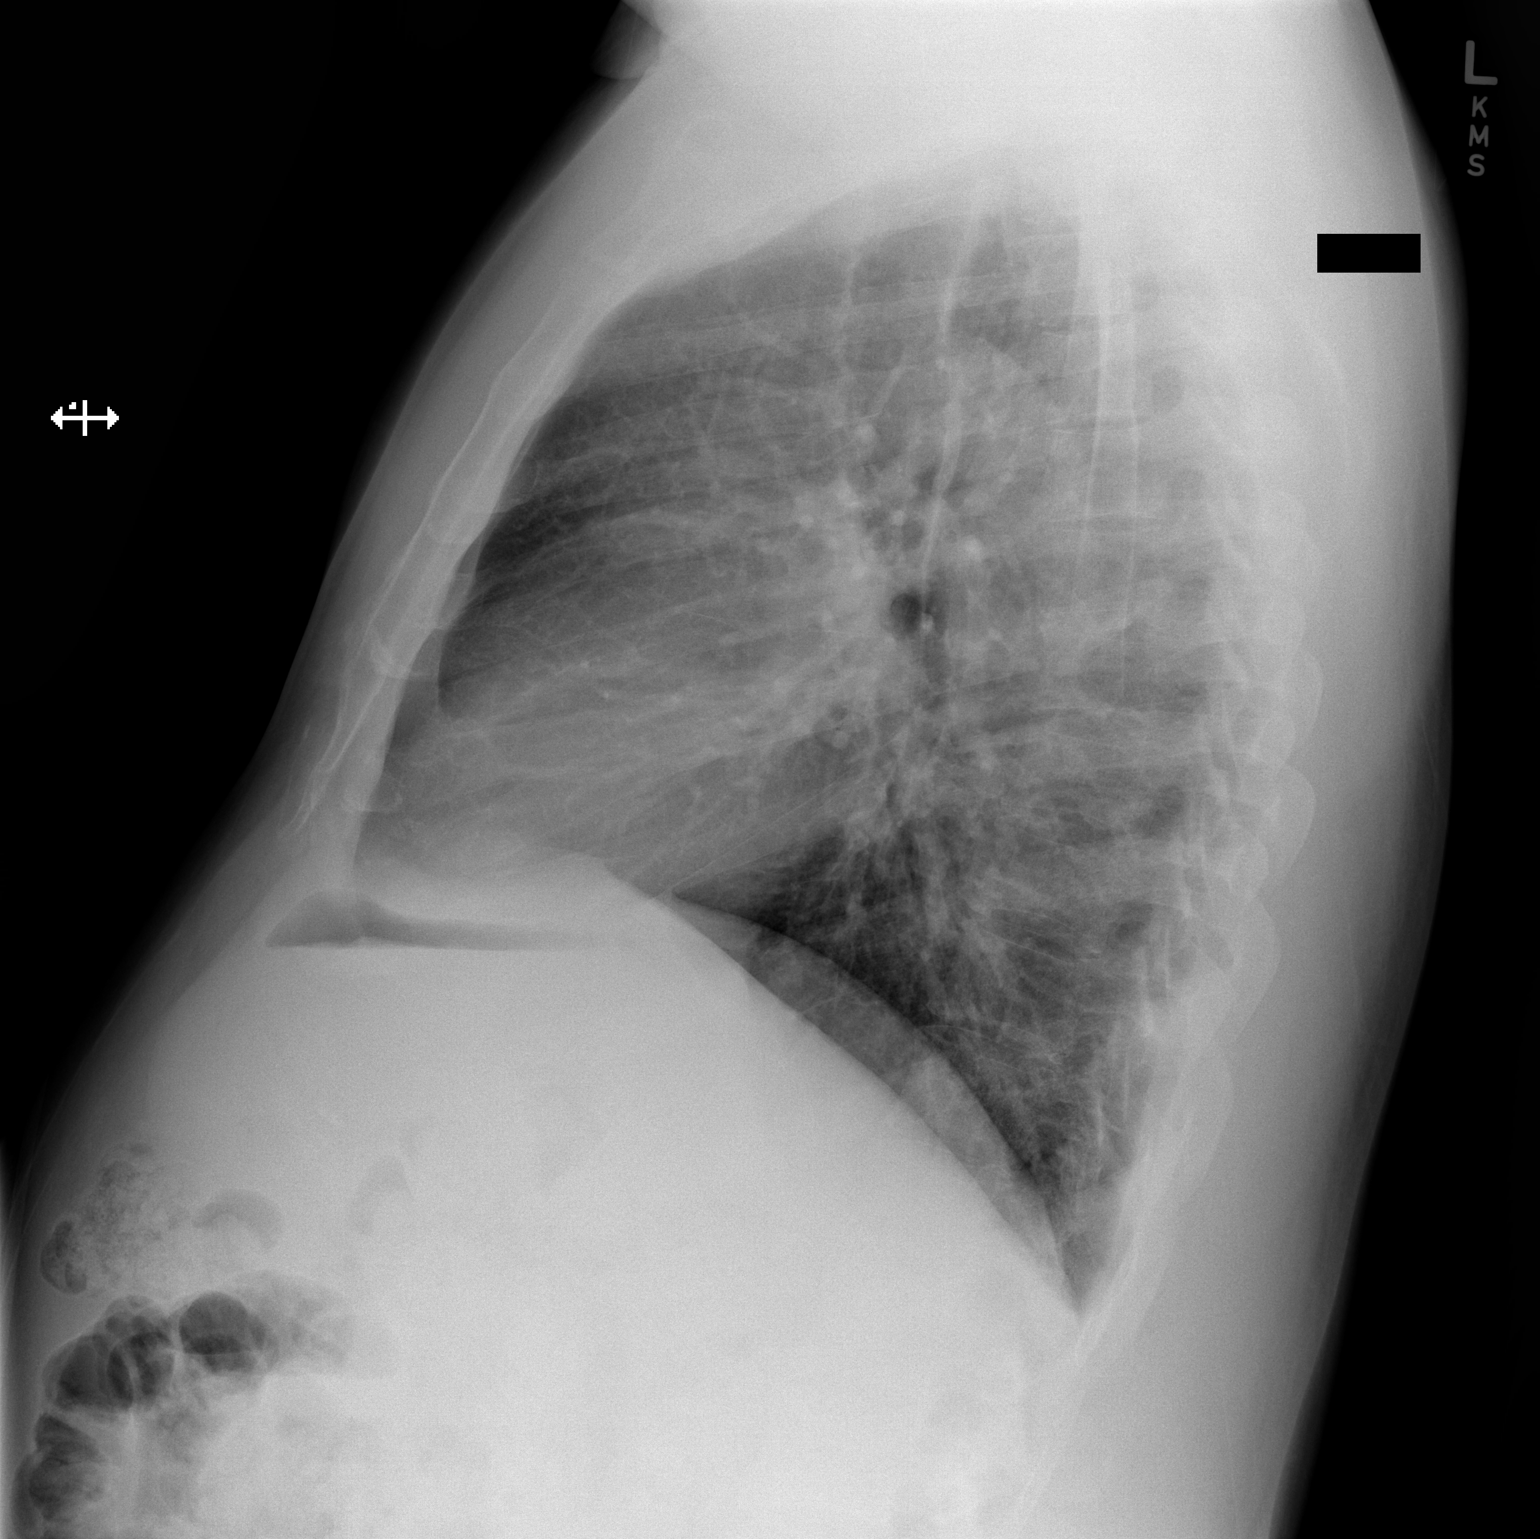

[2 of 2 positions shown; findings below may reference images not displayed]

FINDINGS: Upper-normal size of cardiac silhouette.
Mediastinal contours and pulmonary vascularity normal.
Peribronchial thickening without infiltrate or effusion.
No pneumothorax.
Bones unremarkable.
IMPRESSION: Mild bronchitic changes without acute infiltrate.

## 2012-03-10 ENCOUNTER — Encounter (HOSPITAL_COMMUNITY): Payer: Self-pay | Admitting: *Deleted

## 2012-03-10 ENCOUNTER — Emergency Department (HOSPITAL_COMMUNITY)
Admission: EM | Admit: 2012-03-10 | Discharge: 2012-03-10 | Disposition: A | Discharge status: Home / Self Care | Payer: Self-pay | Attending: Emergency Medicine | Admitting: Emergency Medicine

## 2012-03-10 DIAGNOSIS — E119 Type 2 diabetes mellitus without complications: Secondary | ICD-10-CM | POA: Insufficient documentation

## 2012-03-10 DIAGNOSIS — Z794 Long term (current) use of insulin: Secondary | ICD-10-CM | POA: Insufficient documentation

## 2012-03-10 DIAGNOSIS — G8929 Other chronic pain: Secondary | ICD-10-CM | POA: Insufficient documentation

## 2012-03-10 DIAGNOSIS — G40909 Epilepsy, unspecified, not intractable, without status epilepticus: Secondary | ICD-10-CM | POA: Insufficient documentation

## 2012-03-10 DIAGNOSIS — K861 Other chronic pancreatitis: Secondary | ICD-10-CM | POA: Insufficient documentation

## 2012-03-10 DIAGNOSIS — Z87891 Personal history of nicotine dependence: Secondary | ICD-10-CM | POA: Insufficient documentation

## 2012-03-10 DIAGNOSIS — Z79899 Other long term (current) drug therapy: Secondary | ICD-10-CM | POA: Insufficient documentation

## 2012-03-10 DIAGNOSIS — I10 Essential (primary) hypertension: Secondary | ICD-10-CM | POA: Insufficient documentation

## 2012-03-10 DIAGNOSIS — Z760 Encounter for issue of repeat prescription: Secondary | ICD-10-CM | POA: Insufficient documentation

## 2012-03-10 DIAGNOSIS — F1210 Cannabis abuse, uncomplicated: Secondary | ICD-10-CM | POA: Insufficient documentation

## 2012-03-10 LAB — BLOOD GAS, VENOUS
Drawn by: 323801
Patient temperature: 98.6
pCO2, Ven: 34.6 mmHg — ABNORMAL LOW (ref 45.0–50.0)
pH, Ven: 7.327 — ABNORMAL HIGH (ref 7.250–7.300)

## 2012-03-10 LAB — GLUCOSE, CAPILLARY
Glucose-Capillary: 463 mg/dL — ABNORMAL HIGH (ref 70–99)
Glucose-Capillary: 321 mg/dL — ABNORMAL HIGH (ref 70–99)

## 2012-03-10 LAB — COMPREHENSIVE METABOLIC PANEL
ALT: 32 U/L (ref 0–53)
AST: 21 U/L (ref 0–37)
Albumin: 3.5 g/dL (ref 3.5–5.2)
Alkaline Phosphatase: 95 U/L (ref 39–117)
Calcium: 8.8 mg/dL (ref 8.4–10.5)
GFR calc Af Amer: >90 mL/min (ref >90)
Glucose, Bld: 422 mg/dL — ABNORMAL HIGH (ref 70–99)
Potassium: 3.7 mEq/L (ref 3.5–5.1)
Sodium: 128 mEq/L — ABNORMAL LOW (ref 135–145)
Total Protein: 6.9 g/dL (ref 6.0–8.3)

## 2012-03-10 LAB — CBC WITH DIFFERENTIAL/PLATELET
Basophils Absolute: 0 10*3/uL (ref 0.0–0.1)
Basophils Relative: 1 % (ref 0–1)
Eosinophils Absolute: 0 10*3/uL (ref 0.0–0.7)
Eosinophils Relative: 1 % (ref 0–5)
Lymphs Abs: 2 10*3/uL (ref 0.7–4.0)
MCH: 30 pg (ref 26.0–34.0)
MCV: 83.1 fL (ref 78.0–100.0)
Neutrophils Relative %: 61 % (ref 43–77)
Platelets: 177 10*3/uL (ref 150–400)
RBC: 4.27 MIL/uL (ref 4.22–5.81)
RDW: 12.9 % (ref 11.5–15.5)

## 2012-03-10 LAB — BASIC METABOLIC PANEL
Chloride: 96 mEq/L (ref 96–112)
Creatinine, Ser: 0.63 mg/dL (ref 0.50–1.35)
GFR calc Af Amer: >90 mL/min (ref >90)
Sodium: 130 mEq/L — ABNORMAL LOW (ref 135–145)

## 2012-03-10 LAB — URINALYSIS, ROUTINE W REFLEX MICROSCOPIC
Bilirubin Urine: NEGATIVE
Hgb urine dipstick: NEGATIVE
Nitrite: NEGATIVE
Protein, ur: NEGATIVE mg/dL
Specific Gravity, Urine: 1.022 (ref 1.005–1.030)
Urobilinogen, UA: 0.2 mg/dL (ref 0.0–1.0)

## 2012-03-10 MED ORDER — INSULIN GLARGINE 100 UNIT/ML ~~LOC~~ SOLN: 30.0000 [IU] | Freq: Every day | SUBCUTANEOUS | Status: DC

## 2012-03-10 MED ORDER — SODIUM CHLORIDE 0.9 % IV BOLUS (SEPSIS)
1000.0000 mL | Freq: Once | INTRAVENOUS | Status: AC
  Administered 2012-03-10: 1000 mL via INTRAVENOUS

## 2012-03-10 MED ORDER — OXYCODONE-ACETAMINOPHEN 5-325 MG PO TABS
1.0000 | ORAL_TABLET | Freq: Once | ORAL | Status: AC
  Administered 2012-03-10: 1 via ORAL
  Filled 2012-03-10: qty 1

## 2012-03-10 MED ORDER — INSULIN ASPART 100 UNIT/ML ~~LOC~~ SOLN: 14.0000 [IU] | Freq: Three times a day (TID) | SUBCUTANEOUS | Status: DC

## 2012-03-10 MED ORDER — POTASSIUM CHLORIDE CRYS ER 20 MEQ PO TBCR
20.0000 meq | EXTENDED_RELEASE_TABLET | Freq: Once | ORAL | Status: AC
  Administered 2012-03-10: 20 meq via ORAL
  Filled 2012-03-10: qty 1

## 2012-03-10 MED ORDER — INSULIN REGULAR HUMAN 100 UNIT/ML IJ SOLN: 8.0000 [IU] | Freq: Once | INTRAMUSCULAR | Status: DC

## 2012-03-10 MED ORDER — INSULIN ASPART 100 UNIT/ML ~~LOC~~ SOLN
8.0000 [IU] | Freq: Once | SUBCUTANEOUS | Status: AC
  Administered 2012-03-10: 8 [IU] via SUBCUTANEOUS
  Filled 2012-03-10: qty 1

## 2012-03-10 NOTE — ED Provider Notes (Signed)
Physical Exam  BP 139/93  Pulse 106  Temp 98.1 F (36.7 C) (Oral)  Resp 18  SpO2 100%  Physical Exam  On exam patient is alert and oriented complaining of pain. Heart is a regular rate and rhythm no murmurs rubs gallops, lungs clear to auscultation, abdomen is soft with left upper quadrant tenderness and mid abdomen tenderness without guarding or rebound tenderness. Bowel sounds normal.  ED Course  Procedures  MDM Patient report received at the end of shift. Patient has history of diabetes who has currently been out of his regular diabetic medication for the past 2 months. He also has history of chronic abdominal pain, history of pancreatitis and note that he drink "some alcohol last night". He endorsed 10 out of 10 upper abdominal tenderness that felt similar to his pancreatitis.  Also endorse nausea.   Alcoholic intoxication, needs hydration and CBG control but likely will be dc.  Recheck istat after 2nd L NS.  Pt has Na+ of 128, with initial CBG 422.  Likely pseudohyponatremia.  8:56 AM Electrolytes improved on recheck.  Pt able to tolerates PO.  CBG 267.  K+ 3.2, supplementation given.  I will also refill pt's diabetic medication, give resource for referral and return precaution discussed.  Alcohol cessation discussed.   1. Chronic abdominal pain 2. Alcohol abuse 3. Medication refill  BP 104/67  Pulse 92  Temp 98.1 F (36.7 C) (Oral)  Resp 16  SpO2 100%  I have reviewed nursing notes and vital signs.   I reviewed available ER/hospitalization records thought the EMR  Results for orders placed during the hospital encounter of 03/10/12  GLUCOSE, CAPILLARY      Component Value Range   Glucose-Capillary 463 (*) 70 - 99 mg/dL   Comment 1 Documented in Chart    URINALYSIS, ROUTINE W REFLEX MICROSCOPIC      Component Value Range   Color, Urine YELLOW  YELLOW   APPearance CLEAR  CLEAR   Specific Gravity, Urine 1.022  1.005 - 1.030   pH 5.5  5.0 - 8.0   Glucose, UA >1000  (*) NEGATIVE mg/dL   Hgb urine dipstick NEGATIVE  NEGATIVE   Bilirubin Urine NEGATIVE  NEGATIVE   Ketones, ur 15 (*) NEGATIVE mg/dL   Protein, ur NEGATIVE  NEGATIVE mg/dL   Urobilinogen, UA 0.2  0.0 - 1.0 mg/dL   Nitrite NEGATIVE  NEGATIVE   Leukocytes, UA NEGATIVE  NEGATIVE  CBC WITH DIFFERENTIAL      Component Value Range   WBC 5.8  4.0 - 10.5 K/uL   RBC 4.27  4.22 - 5.81 MIL/uL   Hemoglobin 12.8 (*) 13.0 - 17.0 g/dL   HCT 16.1 (*) 09.6 - 04.5 %   MCV 83.1  78.0 - 100.0 fL   MCH 30.0  26.0 - 34.0 pg   MCHC 36.1 (*) 30.0 - 36.0 g/dL   RDW 40.9  81.1 - 91.4 %   Platelets 177  150 - 400 K/uL   Neutrophils Relative 61  43 - 77 %   Neutro Abs 3.5  1.7 - 7.7 K/uL   Lymphocytes Relative 34  12 - 46 %   Lymphs Abs 2.0  0.7 - 4.0 K/uL   Monocytes Relative 4  3 - 12 %   Monocytes Absolute 0.2  0.1 - 1.0 K/uL   Eosinophils Relative 1  0 - 5 %   Eosinophils Absolute 0.0  0.0 - 0.7 K/uL   Basophils Relative 1  0 - 1 %  Basophils Absolute 0.0  0.0 - 0.1 K/uL  BLOOD GAS, VENOUS      Component Value Range   pH, Ven 7.327 (*) 7.250 - 7.300   pCO2, Ven 34.6 (*) 45.0 - 50.0 mmHg   pO2, Ven 45.4 (*) 30.0 - 45.0 mmHg   Bicarbonate 17.6 (*) 20.0 - 24.0 mEq/L   TCO2 16.0  0 - 100 mmol/L   Acid-base deficit 7.1 (*) 0.0 - 2.0 mmol/L   O2 Saturation 78.9     Patient temperature 98.6     Collection site VEIN     Drawn by [id number]     Sample type VENOUS    COMPREHENSIVE METABOLIC PANEL      Component Value Range   Sodium 128 (*) 135 - 145 mEq/L   Potassium 3.7  3.5 - 5.1 mEq/L   Chloride 89 (*) 96 - 112 mEq/L   CO2 17 (*) 19 - 32 mEq/L   Glucose, Bld 422 (*) 70 - 99 mg/dL   BUN 6  6 - 23 mg/dL   Creatinine, Ser 9.52  0.50 - 1.35 mg/dL   Calcium 8.8  8.4 - 84.1 mg/dL   Total Protein 6.9  6.0 - 8.3 g/dL   Albumin 3.5  3.5 - 5.2 g/dL   AST 21  0 - 37 U/L   ALT 32  0 - 53 U/L   Alkaline Phosphatase 95  39 - 117 U/L   Total Bilirubin 0.3  0.3 - 1.2 mg/dL   GFR calc non Af Amer >90  >90  mL/min   GFR calc Af Amer >90  >90 mL/min  LIPASE, BLOOD      Component Value Range   Lipase 13  11 - 59 U/L  URINE MICROSCOPIC-ADD ON      Component Value Range   Urine-Other       Value: NO FORMED ELEMENTS SEEN ON URINE MICROSCOPIC EXAMINATION  GLUCOSE, CAPILLARY      Component Value Range   Glucose-Capillary 321 (*) 70 - 99 mg/dL   Comment 1 Documented in Chart    BASIC METABOLIC PANEL      Component Value Range   Sodium 130 (*) 135 - 145 mEq/L   Potassium 3.2 (*) 3.5 - 5.1 mEq/L   Chloride 96  96 - 112 mEq/L   CO2 17 (*) 19 - 32 mEq/L   Glucose, Bld 267 (*) 70 - 99 mg/dL   BUN 5 (*) 6 - 23 mg/dL   Creatinine, Ser 3.24  0.50 - 1.35 mg/dL   Calcium 7.9 (*) 8.4 - 10.5 mg/dL   GFR calc non Af Amer >90  >90 mL/min   GFR calc Af Amer >90  >90 mL/min   No results found.    Fayrene Helper, PA-C 03/10/12 [phone]

## 2012-03-10 NOTE — ED Provider Notes (Signed)
History     CSN: [id number]  Arrival date & time 03/10/12  0249   First MD Initiated Contact with Patient 03/10/12 [phone]      Chief Complaint  Patient presents with  . Abdominal Pain    (Consider location/radiation/quality/duration/timing/severity/associated sxs/prior treatment) HPI Comments: Hx of pancreatitis has been drinking now with increased pain aslo BS elevated Patient has Hx medication non complaince Denies N/V/D    The history is provided by the patient.    Past Medical History  Diagnosis Date  . Diabetes mellitus   . Hypertension   . Seizures   . Pancreatitis   . Alcohol abuse   . Chronic abdominal pain   . Alcoholism /alcohol abuse     Past Surgical History  Procedure Date  . Appendectomy     Family History  Problem Relation Age of Onset  . Hypertension      History  Substance Use Topics  . Smoking status: Former Smoker    Types: Cigarettes    Quit date: 12/21/1984  . Smokeless tobacco: Never Used  . Alcohol Use: Yes     Comment: heavy drinker: binge drinking per pt      Review of Systems  Constitutional: Negative for fever and diaphoresis.  Respiratory: Negative for cough.   Cardiovascular: Negative for chest pain.  Gastrointestinal: Positive for abdominal pain. Negative for nausea and vomiting.  Genitourinary: Negative for dysuria.  Skin: Negative for wound.  Neurological: Negative for headaches.    Allergies  Morphine and related  Home Medications   Current Outpatient Rx  Name  Route  Sig  Dispense  Refill  . AMLODIPINE BESYLATE 5 MG PO TABS   Oral   Take 2 tablets (10 mg total) by mouth daily.   30 tablet   0   . AMYLASE-LIPASE-PROTEASE 66.05-27-73 MU PO CPEP   Oral   Take 1 capsule by mouth 3 (three) times daily with meals. Usually eats 2 large meals per day         . FENOFIBRATE 160 MG PO TABS   Oral   Take 1 tablet (160 mg total) by mouth daily with breakfast.   30 tablet   0   . GABAPENTIN 300 MG PO CAPS    Oral   Take 300 mg by mouth 3 (three) times daily.           . INSULIN ASPART 100 UNIT/ML [place] SOLN   Subcutaneous   Inject 14-18 Units into the skin 3 (three) times daily before meals. Based on sliding scale         . INSULIN GLARGINE 100 UNIT/ML [place] SOLN   Subcutaneous   Inject 30 Units into the skin at bedtime.   10 mL   2   . OXYCODONE-ACETAMINOPHEN 5-325 MG PO TABS   Oral   Take 1-2 tablets by mouth every 6 (six) hours as needed.   30 tablet   0   . OXYCODONE-ACETAMINOPHEN 5-325 MG PO TABS      1 to 2 tabs PO q6hrs  PRN for pain   10 tablet   0   . PHENYTOIN SODIUM EXTENDED 100 MG PO CAPS   Oral   Take 200 mg by mouth at bedtime.           Marland Kitchen SIMVASTATIN 20 MG PO TABS   Oral   Take 1 tablet (20 mg total) by mouth daily at 6 PM.   30 tablet   0     BP 139/93  Pulse 106  Temp 98.1 F (36.7 C) (Oral)  Resp 18  SpO2 100%  Physical Exam  Constitutional: He is oriented to person, place, and time. He appears well-developed.  Eyes: Pupils are equal, round, and reactive to light.  Cardiovascular: Normal rate.   Pulmonary/Chest: Effort normal.  Abdominal: Soft. There is generalized tenderness.  Musculoskeletal: Normal range of motion.  Neurological: He is alert and oriented to person, place, and time.  Skin: Skin is warm.    ED Course  Procedures (including critical care time)  Labs Reviewed  GLUCOSE, CAPILLARY - Abnormal; Notable for the following:    Glucose-Capillary 463 (*)     All other components within normal limits  URINALYSIS, ROUTINE W REFLEX MICROSCOPIC - Abnormal; Notable for the following:    Glucose, UA >1000 (*)     Ketones, ur 15 (*)     All other components within normal limits  CBC WITH DIFFERENTIAL - Abnormal; Notable for the following:    Hemoglobin 12.8 (*)     HCT 35.5 (*)     MCHC 36.1 (*)     All other components within normal limits  BLOOD GAS, VENOUS - Abnormal; Notable for the following:    pH, Ven 7.327 (*)     pCO2,  Ven 34.6 (*)     pO2, Ven 45.4 (*)     Bicarbonate 17.6 (*)     Acid-base deficit 7.1 (*)     All other components within normal limits  COMPREHENSIVE METABOLIC PANEL - Abnormal; Notable for the following:    Sodium 128 (*)     Chloride 89 (*)     CO2 17 (*)     Glucose, Bld 422 (*)     All other components within normal limits  GLUCOSE, CAPILLARY - Abnormal; Notable for the following:    Glucose-Capillary 321 (*)     All other components within normal limits  LIPASE, BLOOD  URINE MICROSCOPIC-ADD ON   No results found.   No diagnosis found.    MDM  Hydrate control BS and pain recheck I-stat       Arman Filter, NP 03/10/12 [id number]  Arman Filter, NP 03/10/12 [phone]

## 2012-03-10 NOTE — ED Notes (Signed)
EMS called to home.  Found patient sitting on porch outside. He was complaining of abdominal pain stating his pancreatitis was Acting up.  He currently rates his pain 10 of 10

## 2012-03-11 NOTE — ED Provider Notes (Signed)
Medical screening examination/treatment/procedure(s) were performed by non-physician practitioner and as supervising physician I was immediately available for consultation/collaboration.   Treon Kehl M Reginia Battie, DO 03/11/12 1927 

## 2012-03-11 NOTE — ED Provider Notes (Signed)
Medical screening examination/treatment/procedure(s) were performed by non-physician practitioner and as supervising physician I was immediately available for consultation/collaboration.   Taiki Buckwalter M Fred Hammes, DO 03/11/12 1927 

## 2012-04-06 ENCOUNTER — Emergency Department (HOSPITAL_COMMUNITY)
Admission: EM | Admit: 2012-04-06 | Discharge: 2012-04-06 | Disposition: A | Discharge status: Home / Self Care | Payer: Self-pay | Attending: Emergency Medicine | Admitting: Emergency Medicine

## 2012-04-06 ENCOUNTER — Encounter (HOSPITAL_COMMUNITY): Payer: Self-pay | Admitting: *Deleted

## 2012-04-06 DIAGNOSIS — R739 Hyperglycemia, unspecified: Secondary | ICD-10-CM

## 2012-04-06 DIAGNOSIS — I10 Essential (primary) hypertension: Secondary | ICD-10-CM | POA: Insufficient documentation

## 2012-04-06 DIAGNOSIS — Z87891 Personal history of nicotine dependence: Secondary | ICD-10-CM | POA: Insufficient documentation

## 2012-04-06 DIAGNOSIS — G8929 Other chronic pain: Secondary | ICD-10-CM

## 2012-04-06 DIAGNOSIS — F1010 Alcohol abuse, uncomplicated: Secondary | ICD-10-CM

## 2012-04-06 DIAGNOSIS — Z8619 Personal history of other infectious and parasitic diseases: Secondary | ICD-10-CM | POA: Insufficient documentation

## 2012-04-06 DIAGNOSIS — Z8719 Personal history of other diseases of the digestive system: Secondary | ICD-10-CM | POA: Insufficient documentation

## 2012-04-06 DIAGNOSIS — R109 Unspecified abdominal pain: Secondary | ICD-10-CM | POA: Insufficient documentation

## 2012-04-06 DIAGNOSIS — R1110 Vomiting, unspecified: Secondary | ICD-10-CM | POA: Insufficient documentation

## 2012-04-06 DIAGNOSIS — Z8669 Personal history of other diseases of the nervous system and sense organs: Secondary | ICD-10-CM | POA: Insufficient documentation

## 2012-04-06 DIAGNOSIS — E119 Type 2 diabetes mellitus without complications: Secondary | ICD-10-CM | POA: Insufficient documentation

## 2012-04-06 LAB — COMPREHENSIVE METABOLIC PANEL
ALT: 46 U/L (ref 0–53)
AST: 50 U/L — ABNORMAL HIGH (ref 0–37)
Albumin: 3.6 g/dL (ref 3.5–5.2)
Calcium: 9.1 mg/dL (ref 8.4–10.5)
GFR calc Af Amer: >90 mL/min (ref >90)
Sodium: 138 mEq/L (ref 135–145)
Total Protein: 7 g/dL (ref 6.0–8.3)

## 2012-04-06 LAB — URINE MICROSCOPIC-ADD ON

## 2012-04-06 LAB — CBC WITH DIFFERENTIAL/PLATELET
Basophils Absolute: 0.1 10*3/uL (ref 0.0–0.1)
HCT: 35.5 % — ABNORMAL LOW (ref 39.0–52.0)
Lymphocytes Relative: 44 % (ref 12–46)
Lymphs Abs: 3.1 10*3/uL (ref 0.7–4.0)
Neutro Abs: 3.5 10*3/uL (ref 1.7–7.7)
Platelets: 196 10*3/uL (ref 150–400)
RBC: 4.2 MIL/uL — ABNORMAL LOW (ref 4.22–5.81)
RDW: 12.8 % (ref 11.5–15.5)
WBC: 7.1 10*3/uL (ref 4.0–10.5)

## 2012-04-06 LAB — GLUCOSE, CAPILLARY
Glucose-Capillary: 297 mg/dL — ABNORMAL HIGH (ref 70–99)
Glucose-Capillary: 329 mg/dL — ABNORMAL HIGH (ref 70–99)

## 2012-04-06 LAB — POCT I-STAT, CHEM 8
BUN: <3 mg/dL — ABNORMAL LOW (ref 6–23)
Calcium, Ion: 1.14 mmol/L (ref 1.12–1.23)
Chloride: 101 mEq/L (ref 96–112)
Glucose, Bld: 302 mg/dL — ABNORMAL HIGH (ref 70–99)

## 2012-04-06 LAB — URINALYSIS, ROUTINE W REFLEX MICROSCOPIC
Leukocytes, UA: NEGATIVE
Nitrite: NEGATIVE
Specific Gravity, Urine: 1.027 (ref 1.005–1.030)
pH: 6 (ref 5.0–8.0)

## 2012-04-06 MED ORDER — INSULIN ASPART 100 UNIT/ML ~~LOC~~ SOLN
5.0000 [IU] | Freq: Once | SUBCUTANEOUS | Status: AC
  Administered 2012-04-06: 5 [IU] via SUBCUTANEOUS
  Filled 2012-04-06: qty 5

## 2012-04-06 MED ORDER — INSULIN GLARGINE 100 UNIT/ML ~~LOC~~ SOLN: 30.0000 [IU] | Freq: Every day | SUBCUTANEOUS | Status: DC

## 2012-04-06 MED ORDER — INSULIN ASPART 100 UNIT/ML ~~LOC~~ SOLN: 14.0000 [IU] | Freq: Three times a day (TID) | SUBCUTANEOUS | Status: DC

## 2012-04-06 MED ORDER — ONDANSETRON HCL 8 MG PO TABS
8.0000 mg | ORAL_TABLET | Freq: Once | ORAL | Status: AC
  Administered 2012-04-06: 8 mg via ORAL
  Filled 2012-04-06: qty 1

## 2012-04-06 MED ORDER — KETOROLAC TROMETHAMINE 60 MG/2ML IM SOLN
60.0000 mg | Freq: Once | INTRAMUSCULAR | Status: AC
  Administered 2012-04-06: 60 mg via INTRAMUSCULAR
  Filled 2012-04-06: qty 2

## 2012-04-06 MED ORDER — GI COCKTAIL ~~LOC~~
30.0000 mL | Freq: Once | ORAL | Status: AC
  Administered 2012-04-06: 30 mL via ORAL
  Filled 2012-04-06: qty 30

## 2012-04-06 MED ORDER — POTASSIUM CHLORIDE CRYS ER 20 MEQ PO TBCR
40.0000 meq | EXTENDED_RELEASE_TABLET | Freq: Once | ORAL | Status: AC
  Administered 2012-04-06: 40 meq via ORAL
  Filled 2012-04-06: qty 2

## 2012-04-06 NOTE — ED Notes (Signed)
Pt waiting to speak with case manager. Breakfast tray ordered. No further needs at this time.

## 2012-04-06 NOTE — ED Notes (Signed)
Pt given coupon for Lantus and voucher for humulog.

## 2012-04-06 NOTE — ED Provider Notes (Signed)
History     CSN: [id number]  Arrival date & time 04/06/12  0131   First MD Initiated Contact with Patient 04/06/12 0138      Chief Complaint  Patient presents with  . Hyperglycemia   HPI  History provided by the patient and recent medical chart. Patient is a 52 year old African American male with history of diabetes, hypertension, chronic alcohol abuse with chronic pancreatitis who presents with complaints of elevated blood sugar and chronic abdominal pains. Patient does admit to drinking half a bottle of vodka today. He has his typical upper right sided abdominal pains that were worse after heavy drinking. He reports one episode of vomiting earlier in the day after eating tomato soup. Denies any fever, chills or sweats. Patient states he has been out of his blood sugar medications for at least the past 2 months. He denies any other complaints. Denies any other aggravating or alleviating factors. Denies any other associated symptoms.    Past Medical History  Diagnosis Date  . Diabetes mellitus   . Hypertension   . Seizures   . Pancreatitis   . Alcohol abuse   . Chronic abdominal pain   . Alcoholism /alcohol abuse   . Hepatitis     Past Surgical History  Procedure Laterality Date  . Appendectomy      Family History  Problem Relation Age of Onset  . Hypertension      History  Substance Use Topics  . Smoking status: Former Smoker    Types: Cigarettes    Quit date: 12/21/1984  . Smokeless tobacco: Never Used  . Alcohol Use: Yes     Comment: heavy drinker: binge drinking per pt      Review of Systems  Constitutional: Negative for fever and chills.  Respiratory: Negative for cough and shortness of breath.   Gastrointestinal: Positive for vomiting and abdominal pain. Negative for diarrhea and constipation.  All other systems reviewed and are negative.    Allergies  Morphine and related  Home Medications  No current outpatient prescriptions on file.  BP  118/77  Pulse 78  Temp(Src) 98 F (36.7 C) (Oral)  Resp 18  SpO2 100%  Physical Exam  Nursing note and vitals reviewed. Constitutional: He is oriented to person, place, and time. He appears well-developed and well-nourished. No distress.  HENT:  Head: Normocephalic.  Neck: Normal range of motion. Neck supple.  Cardiovascular: Normal rate and regular rhythm.   Pulmonary/Chest: Effort normal and breath sounds normal.  Abdominal: Soft. He exhibits no distension. There is tenderness. There is no rebound, no guarding, no tenderness at McBurney's point and negative Murphy's sign.  Diffuse tenderness greatest in the epigastric right upper quadrant. No Murphy's sign.  Musculoskeletal: Normal range of motion.  Neurological: He is alert and oriented to person, place, and time.  Skin: Skin is warm.  Psychiatric: He has a normal mood and affect. His behavior is normal.    ED Course  Procedures   Results for orders placed during the hospital encounter of 04/06/12  CBC WITH DIFFERENTIAL      Result Value Range   WBC 7.1  4.0 - 10.5 K/uL   RBC 4.20 (*) 4.22 - 5.81 MIL/uL   Hemoglobin 13.0  13.0 - 17.0 g/dL   HCT 40.9 (*) 81.1 - 91.4 %   MCV 84.5  78.0 - 100.0 fL   MCH 31.0  26.0 - 34.0 pg   MCHC 36.6 (*) 30.0 - 36.0 g/dL   RDW 78.2  95.6 -  15.5 %   Platelets 196  150 - 400 K/uL   Neutrophils Relative 49  43 - 77 %   Neutro Abs 3.5  1.7 - 7.7 K/uL   Lymphocytes Relative 44  12 - 46 %   Lymphs Abs 3.1  0.7 - 4.0 K/uL   Monocytes Relative 6  3 - 12 %   Monocytes Absolute 0.4  0.1 - 1.0 K/uL   Eosinophils Relative 1  0 - 5 %   Eosinophils Absolute 0.1  0.0 - 0.7 K/uL   Basophils Relative 1  0 - 1 %   Basophils Absolute 0.1  0.0 - 0.1 K/uL  URINALYSIS, ROUTINE W REFLEX MICROSCOPIC      Result Value Range   Color, Urine YELLOW  YELLOW   APPearance CLEAR  CLEAR   Specific Gravity, Urine 1.027  1.005 - 1.030   pH 6.0  5.0 - 8.0   Glucose, UA >1000 (*) NEGATIVE mg/dL   Hgb urine  dipstick NEGATIVE  NEGATIVE   Bilirubin Urine NEGATIVE  NEGATIVE   Ketones, ur 40 (*) NEGATIVE mg/dL   Protein, ur NEGATIVE  NEGATIVE mg/dL   Urobilinogen, UA 0.2  0.0 - 1.0 mg/dL   Nitrite NEGATIVE  NEGATIVE   Leukocytes, UA NEGATIVE  NEGATIVE  GLUCOSE, CAPILLARY      Result Value Range   Glucose-Capillary 297 (*) 70 - 99 mg/dL   Comment 1 Notify RN    COMPREHENSIVE METABOLIC PANEL      Result Value Range   Sodium 138  135 - 145 mEq/L   Potassium 3.1 (*) 3.5 - 5.1 mEq/L   Chloride 98  96 - 112 mEq/L   CO2 26  19 - 32 mEq/L   Glucose, Bld 307 (*) 70 - 99 mg/dL   BUN 4 (*) 6 - 23 mg/dL   Creatinine, Ser 4.09  0.50 - 1.35 mg/dL   Calcium 9.1  8.4 - 81.1 mg/dL   Total Protein 7.0  6.0 - 8.3 g/dL   Albumin 3.6  3.5 - 5.2 g/dL   AST 50 (*) 0 - 37 U/L   ALT 46  0 - 53 U/L   Alkaline Phosphatase 89  39 - 117 U/L   Total Bilirubin 0.3  0.3 - 1.2 mg/dL   GFR calc non Af Amer >90  >90 mL/min   GFR calc Af Amer >90  >90 mL/min  URINE MICROSCOPIC-ADD ON      Result Value Range   Squamous Epithelial / LPF RARE  RARE   WBC, UA 3-6  <3 WBC/hpf   Bacteria, UA RARE  RARE  GLUCOSE, CAPILLARY      Result Value Range   Glucose-Capillary 329 (*) 70 - 99 mg/dL   Comment 1 Notify RN    POCT I-STAT, CHEM 8      Result Value Range   Sodium 140  135 - 145 mEq/L   Potassium 3.2 (*) 3.5 - 5.1 mEq/L   Chloride 101  96 - 112 mEq/L   BUN <3 (*) 6 - 23 mg/dL   Creatinine, Ser 9.14  0.50 - 1.35 mg/dL   Glucose, Bld 782 (*) 70 - 99 mg/dL   Calcium, Ion 9.56  2.13 - 1.23 mmol/L   TCO2 27  0 - 100 mmol/L   Hemoglobin 13.3  13.0 - 17.0 g/dL   HCT 08.6  57.8 - 46.9 %   Anion gap of 12. No signs of DKA.     1. Hyperglycemia   2.  Alcohol abuse   3. Chronic pain       MDM  Patient seen and evaluated. Patient with multiple prior evaluations in emergency room for chronic abdominal pain, alcohol abuse. Patient was seen earlier this month and given prescriptions for his insulin medications. He has  not filled these.   Patient continues to appear well. labs unremarkable. Will provide patient with prescriptions and discharge.    Angus Seller, [place] 04/06/12 [phone]

## 2012-04-06 NOTE — ED Notes (Signed)
Hyperglycemia - no meds. For x 2 months to manage sugar...cbg 334. Lives at home but no power, heat.

## 2012-04-07 NOTE — ED Provider Notes (Signed)
Medical screening examination/treatment/procedure(s) were performed by non-physician practitioner and as supervising physician I was immediately available for consultation/collaboration.   Althea Backs, MD 04/07/12 0016 

## 2012-04-13 ENCOUNTER — Encounter (HOSPITAL_COMMUNITY): Payer: Self-pay | Admitting: *Deleted

## 2012-04-13 ENCOUNTER — Emergency Department (HOSPITAL_COMMUNITY)
Admission: EM | Admit: 2012-04-13 | Discharge: 2012-04-13 | Disposition: A | Discharge status: Home / Self Care | Payer: Self-pay | Attending: Emergency Medicine | Admitting: Emergency Medicine

## 2012-04-13 ENCOUNTER — Emergency Department (HOSPITAL_COMMUNITY): Payer: Self-pay

## 2012-04-13 DIAGNOSIS — G8929 Other chronic pain: Secondary | ICD-10-CM | POA: Insufficient documentation

## 2012-04-13 DIAGNOSIS — I10 Essential (primary) hypertension: Secondary | ICD-10-CM | POA: Insufficient documentation

## 2012-04-13 DIAGNOSIS — R739 Hyperglycemia, unspecified: Secondary | ICD-10-CM

## 2012-04-13 DIAGNOSIS — Z794 Long term (current) use of insulin: Secondary | ICD-10-CM | POA: Insufficient documentation

## 2012-04-13 DIAGNOSIS — R112 Nausea with vomiting, unspecified: Secondary | ICD-10-CM | POA: Insufficient documentation

## 2012-04-13 DIAGNOSIS — Z8679 Personal history of other diseases of the circulatory system: Secondary | ICD-10-CM | POA: Insufficient documentation

## 2012-04-13 DIAGNOSIS — F10229 Alcohol dependence with intoxication, unspecified: Secondary | ICD-10-CM | POA: Insufficient documentation

## 2012-04-13 DIAGNOSIS — Z87891 Personal history of nicotine dependence: Secondary | ICD-10-CM | POA: Insufficient documentation

## 2012-04-13 DIAGNOSIS — Z8719 Personal history of other diseases of the digestive system: Secondary | ICD-10-CM | POA: Insufficient documentation

## 2012-04-13 DIAGNOSIS — E1169 Type 2 diabetes mellitus with other specified complication: Secondary | ICD-10-CM | POA: Insufficient documentation

## 2012-04-13 DIAGNOSIS — R1012 Left upper quadrant pain: Secondary | ICD-10-CM | POA: Insufficient documentation

## 2012-04-13 DIAGNOSIS — R109 Unspecified abdominal pain: Secondary | ICD-10-CM

## 2012-04-13 LAB — CBC WITH DIFFERENTIAL/PLATELET
Basophils Absolute: 0 10*3/uL (ref 0.0–0.1)
Basophils Relative: 1 % (ref 0–1)
Eosinophils Absolute: 0.1 10*3/uL (ref 0.0–0.7)
MCH: 31.3 pg (ref 26.0–34.0)
MCHC: 36.6 g/dL — ABNORMAL HIGH (ref 30.0–36.0)
Neutrophils Relative %: 51 % (ref 43–77)
Platelets: 200 10*3/uL (ref 150–400)
RBC: 3.9 MIL/uL — ABNORMAL LOW (ref 4.22–5.81)
RDW: 12.6 % (ref 11.5–15.5)

## 2012-04-13 LAB — COMPREHENSIVE METABOLIC PANEL
AST: 48 U/L — ABNORMAL HIGH (ref 0–37)
CO2: 23 mEq/L (ref 19–32)
Chloride: 96 mEq/L (ref 96–112)
Creatinine, Ser: 0.67 mg/dL (ref 0.50–1.35)
GFR calc non Af Amer: >90 mL/min (ref >90)
Total Bilirubin: 0.2 mg/dL — ABNORMAL LOW (ref 0.3–1.2)

## 2012-04-13 LAB — GLUCOSE, CAPILLARY

## 2012-04-13 LAB — URINALYSIS, ROUTINE W REFLEX MICROSCOPIC
Ketones, ur: 15 mg/dL — AB
Leukocytes, UA: NEGATIVE
Nitrite: NEGATIVE
Protein, ur: NEGATIVE mg/dL

## 2012-04-13 LAB — LIPASE, BLOOD: Lipase: 14 U/L (ref 11–59)

## 2012-04-13 MED ORDER — SODIUM CHLORIDE 0.9 % IV BOLUS (SEPSIS)
1000.0000 mL | Freq: Once | INTRAVENOUS | Status: AC
  Administered 2012-04-13: 1000 mL via INTRAVENOUS

## 2012-04-13 MED ORDER — IOHEXOL 300 MG/ML  SOLN
100.0000 mL | Freq: Once | INTRAMUSCULAR | Status: AC | PRN
  Administered 2012-04-13: 100 mL via INTRAVENOUS

## 2012-04-13 MED ORDER — INSULIN ASPART 100 UNIT/ML ~~LOC~~ SOLN
10.0000 [IU] | Freq: Once | SUBCUTANEOUS | Status: AC
  Administered 2012-04-13: 10 [IU] via INTRAVENOUS
  Filled 2012-04-13: qty 1

## 2012-04-13 MED ORDER — ONDANSETRON HCL 4 MG/2ML IJ SOLN
4.0000 mg | Freq: Once | INTRAMUSCULAR | Status: AC
  Administered 2012-04-13: 4 mg via INTRAVENOUS

## 2012-04-13 MED ORDER — PROMETHAZINE HCL 25 MG/ML IJ SOLN
25.0000 mg | Freq: Once | INTRAMUSCULAR | Status: DC
Start: 2012-04-13 — End: 2012-04-13
  Filled 2012-04-13: qty 1

## 2012-04-13 MED ORDER — HYDROMORPHONE HCL PF 1 MG/ML IJ SOLN
1.0000 mg | INTRAMUSCULAR | Status: DC | PRN
  Administered 2012-04-13: 1 mg via INTRAVENOUS
  Filled 2012-04-13: qty 1

## 2012-04-13 MED ORDER — ONDANSETRON HCL 4 MG/2ML IJ SOLN
INTRAMUSCULAR | Status: AC
  Filled 2012-04-13: qty 2

## 2012-04-13 MED ORDER — IOHEXOL 300 MG/ML  SOLN
25.0000 mL | INTRAMUSCULAR | Status: AC
  Administered 2012-04-13: 25 mL via ORAL

## 2012-04-13 NOTE — ED Notes (Signed)
Pt returned from CT °

## 2012-04-13 NOTE — ED Provider Notes (Signed)
History     CSN: [id number]  Arrival date & time 04/13/12  0152   First MD Initiated Contact with Patient 04/13/12 0222      No chief complaint on file.   (Consider location/radiation/quality/duration/timing/severity/associated sxs/prior treatment) HPI Hx per PT - severe ABD pain wraps around his ABD to his back. Sharp in quality, associated N/V has been drinking ETOH tonight. No blood in emesis.  No F/C.  No trauma. No diarrhea.   Past Medical History  Diagnosis Date  . Diabetes mellitus   . Hypertension   . Seizures   . Pancreatitis   . Alcohol abuse   . Chronic abdominal pain   . Alcoholism /alcohol abuse   . Hepatitis     Past Surgical History  Procedure Laterality Date  . Appendectomy      Family History  Problem Relation Age of Onset  . Hypertension      History  Substance Use Topics  . Smoking status: Former Smoker    Types: Cigarettes    Quit date: 12/21/1984  . Smokeless tobacco: Never Used  . Alcohol Use: Yes     Comment: heavy drinker: binge drinking per pt      Review of Systems  Constitutional: Negative for fever and chills.  HENT: Negative for neck pain and neck stiffness.   Eyes: Negative for pain.  Respiratory: Negative for shortness of breath.   Cardiovascular: Negative for chest pain.  Gastrointestinal: Positive for nausea, vomiting and abdominal pain.  Genitourinary: Negative for dysuria.  Musculoskeletal: Negative for back pain.  Skin: Negative for rash.  Neurological: Negative for headaches.  All other systems reviewed and are negative.    Allergies  Morphine and related  Home Medications   Current Outpatient Rx  Name  Route  Sig  Dispense  Refill  . insulin aspart (NOVOLOG) 100 UNIT/ML injection   Subcutaneous   Inject 14-18 Units into the skin 3 (three) times daily before meals. Based on sliding scale   1 vial   12   . insulin glargine (LANTUS) 100 UNIT/ML injection   Subcutaneous   Inject 30 Units into the skin at  bedtime.   10 mL   0     BP 111/84  Pulse 87  Temp(Src) 97.8 F (36.6 C) (Oral)  Resp 16  SpO2 100%  Physical Exam  Constitutional: He is oriented to person, place, and time. He appears well-developed and well-nourished.  HENT:  Head: Normocephalic and atraumatic.  Eyes: EOM are normal. Pupils are equal, round, and reactive to light.  Neck: Neck supple.  Cardiovascular: Normal rate, regular rhythm and intact distal pulses.   Pulmonary/Chest: Effort normal and breath sounds normal. No respiratory distress.  Abdominal: Bowel sounds are normal. He exhibits no distension and no mass.  TTP LUQ vol guarding  Musculoskeletal: Normal range of motion. He exhibits no edema.  Neurological: He is alert and oriented to person, place, and time.  Skin: Skin is warm and dry.    ED Course  Procedures (including critical care time)  Results for orders placed during the hospital encounter of 04/13/12  GLUCOSE, CAPILLARY      Result Value Range   Glucose-Capillary 487 (*) 70 - 99 mg/dL   Comment 1 Documented in Chart     Comment 2 Notify RN    COMPREHENSIVE METABOLIC PANEL      Result Value Range   Sodium 135  135 - 145 mEq/L   Potassium 3.3 (*) 3.5 - 5.1 mEq/L  Chloride 96  96 - 112 mEq/L   CO2 23  19 - 32 mEq/L   Glucose, Bld 462 (*) 70 - 99 mg/dL   BUN 4 (*) 6 - 23 mg/dL   Creatinine, Ser 4.54  0.50 - 1.35 mg/dL   Calcium 8.3 (*) 8.4 - 10.5 mg/dL   Total Protein 6.2  6.0 - 8.3 g/dL   Albumin 3.1 (*) 3.5 - 5.2 g/dL   AST 48 (*) 0 - 37 U/L   ALT 44  0 - 53 U/L   Alkaline Phosphatase 87  39 - 117 U/L   Total Bilirubin 0.2 (*) 0.3 - 1.2 mg/dL   GFR calc non Af Amer >90  >90 mL/min   GFR calc Af Amer >90  >90 mL/min  CBC WITH DIFFERENTIAL      Result Value Range   WBC 5.2  4.0 - 10.5 K/uL   RBC 3.90 (*) 4.22 - 5.81 MIL/uL   Hemoglobin 12.2 (*) 13.0 - 17.0 g/dL   HCT 09.8 (*) 11.9 - 14.7 %   MCV 85.4  78.0 - 100.0 fL   MCH 31.3  26.0 - 34.0 pg   MCHC 36.6 (*) 30.0 - 36.0 g/dL    RDW 82.9  56.2 - 13.0 %   Platelets 200  150 - 400 K/uL   Neutrophils Relative 51  43 - 77 %   Neutro Abs 2.7  1.7 - 7.7 K/uL   Lymphocytes Relative 40  12 - 46 %   Lymphs Abs 2.1  0.7 - 4.0 K/uL   Monocytes Relative 7  3 - 12 %   Monocytes Absolute 0.4  0.1 - 1.0 K/uL   Eosinophils Relative 2  0 - 5 %   Eosinophils Absolute 0.1  0.0 - 0.7 K/uL   Basophils Relative 1  0 - 1 %   Basophils Absolute 0.0  0.0 - 0.1 K/uL  LIPASE, BLOOD      Result Value Range   Lipase 14  11 - 59 U/L  URINALYSIS, ROUTINE W REFLEX MICROSCOPIC      Result Value Range   Color, Urine YELLOW  YELLOW   APPearance CLEAR  CLEAR   Specific Gravity, Urine 1.040 (*) 1.005 - 1.030   pH 6.0  5.0 - 8.0   Glucose, UA >1000 (*) NEGATIVE mg/dL   Hgb urine dipstick NEGATIVE  NEGATIVE   Bilirubin Urine NEGATIVE  NEGATIVE   Ketones, ur 15 (*) NEGATIVE mg/dL   Protein, ur NEGATIVE  NEGATIVE mg/dL   Urobilinogen, UA 0.2  0.0 - 1.0 mg/dL   Nitrite NEGATIVE  NEGATIVE   Leukocytes, UA NEGATIVE  NEGATIVE  URINE MICROSCOPIC-ADD ON      Result Value Range   Squamous Epithelial / LPF RARE  RARE   WBC, UA 0-2  <3 WBC/hpf   RBC / HPF 0-2  <3 RBC/hpf   Bacteria, UA RARE  RARE   Ct Abdomen Pelvis W Contrast  04/13/2012  *RADIOLOGY REPORT*  Clinical Data: Left lower quadrant abdominal pain.  Hyperglycemia.  CT ABDOMEN AND PELVIS WITH CONTRAST  Technique:  Multidetector CT imaging of the abdomen and pelvis was performed following the standard protocol during bolus administration of intravenous contrast.  Contrast: OMNIPAQUE IOHEXOL 300 MG/ML  SOLN  Comparison: CT of the abdomen and pelvis performed 10/23/2011  Findings: The visualized lung bases are clear.  The liver and spleen are unremarkable in appearance.  The gallbladder is within normal limits.  A 2.5 x 1.8 cm pseudocyst  is again noted at the body of the pancreas, with decreased surrounding inflammation in comparison to the prior study.  The pancreas is otherwise  unremarkable.  The lungs are within normal limits.  There is very mild right-sided hydronephrosis, with prominence of the right ureter along much of its course; this may be chronic in nature, as there is no evidence of distal obstruction.  The left kidney is unremarkable in appearance.  Nonspecific perinephric stranding is noted bilaterally.  No free fluid is identified.  The small bowel is unremarkable in appearance.  The stomach is within normal limits.  No acute vascular abnormalities are seen.  Mild scattered calcification is noted along the abdominal aorta and its branches.  The patient is status post appendectomy.  The colon is unremarkable in appearance.  There is mild redundancy of the sigmoid colon.  The rectum is distended to 8.8 cm in transverse dimension, raising question for mild constipation.  The bladder is mildly distended and grossly unremarkable in appearance.  The prostate is borderline normal in size.  No inguinal lymphadenopathy is seen.  No acute osseous abnormalities are identified.  IMPRESSION:  1.  No acute abnormality seen within the abdomen or pelvis. 2.  Rectum distended with stool to 8.8 cm in transverse dimension, raising question for mild constipation. 3.  Very mild apparent right-sided hydronephrosis may be chronic in nature; there is no evidence of distal obstruction. 4.  Stable 2.5 cm pseudocyst at the body of the pancreas, with decreased surrounding inflammation in comparison to the prior study. 5.  Mild scattered calcification along the abdominal aorta and branches.   Original Report Authenticated By: Tonia Ghent, M.D.      IVFs, IV Dilaudid  MDM  Acute on chronic abdominal pain after drinking alcohol with history of pancreatitis  Evaluated with labs and imaging as above.  Treated with IV narcotics and IV fluids - improving condition  IV insulin for hyperglycemia - plan followup primary care physician for further evaluation. Patient has prescription for insulin and  agrees to get it filled. Alcohol precautions provided and verbalizes understood  Vital signs nursing notes reviewed and considered        Sunnie Nielsen, MD 04/13/12 [phone]

## 2012-04-13 NOTE — ED Notes (Signed)
Patient transported to CT 

## 2012-04-13 NOTE — ED Notes (Signed)
Per EMS:  Pt called EMS for LLQ abdominal pani that started around 1.5 hours ago, rates pain 7/10, ETOH.  Pt is also hyperglycemic at 439, hasn't had his insulin or any of of his medications for 2 months.  Pt st's his house was cold because it has no electricity or heat, so he started drinking and then the pain started.

## 2012-04-13 NOTE — ED Notes (Signed)
Pt admits to drinking a few shots of vodka and beer tonight.  St's "I know I shouldn't but it was just there."

## 2012-04-13 NOTE — ED Notes (Signed)
Pt reports severe abdominal pain.  Will administer PRN pain medication.

## 2012-04-28 ENCOUNTER — Emergency Department (HOSPITAL_COMMUNITY)
Admission: EM | Admit: 2012-04-28 | Discharge: 2012-04-28 | Disposition: A | Discharge status: Home Health | Payer: Self-pay | Attending: Emergency Medicine | Admitting: Emergency Medicine

## 2012-04-28 ENCOUNTER — Encounter (HOSPITAL_COMMUNITY): Payer: Self-pay | Admitting: *Deleted

## 2012-04-28 DIAGNOSIS — F1010 Alcohol abuse, uncomplicated: Secondary | ICD-10-CM | POA: Insufficient documentation

## 2012-04-28 DIAGNOSIS — Z8719 Personal history of other diseases of the digestive system: Secondary | ICD-10-CM | POA: Insufficient documentation

## 2012-04-28 DIAGNOSIS — Z8669 Personal history of other diseases of the nervous system and sense organs: Secondary | ICD-10-CM | POA: Insufficient documentation

## 2012-04-28 DIAGNOSIS — E119 Type 2 diabetes mellitus without complications: Secondary | ICD-10-CM | POA: Insufficient documentation

## 2012-04-28 DIAGNOSIS — G8929 Other chronic pain: Secondary | ICD-10-CM | POA: Insufficient documentation

## 2012-04-28 DIAGNOSIS — F10929 Alcohol use, unspecified with intoxication, unspecified: Secondary | ICD-10-CM

## 2012-04-28 DIAGNOSIS — R739 Hyperglycemia, unspecified: Secondary | ICD-10-CM

## 2012-04-28 DIAGNOSIS — R109 Unspecified abdominal pain: Secondary | ICD-10-CM | POA: Insufficient documentation

## 2012-04-28 DIAGNOSIS — I10 Essential (primary) hypertension: Secondary | ICD-10-CM | POA: Insufficient documentation

## 2012-04-28 DIAGNOSIS — R112 Nausea with vomiting, unspecified: Secondary | ICD-10-CM | POA: Insufficient documentation

## 2012-04-28 DIAGNOSIS — Z87891 Personal history of nicotine dependence: Secondary | ICD-10-CM | POA: Insufficient documentation

## 2012-04-28 LAB — COMPREHENSIVE METABOLIC PANEL
ALT: 35 U/L (ref 0–53)
Albumin: 3.9 g/dL (ref 3.5–5.2)
Alkaline Phosphatase: 93 U/L (ref 39–117)
BUN: 6 mg/dL (ref 6–23)
Chloride: 90 mEq/L — ABNORMAL LOW (ref 96–112)
GFR calc Af Amer: >90 mL/min (ref >90)
Glucose, Bld: 538 mg/dL — ABNORMAL HIGH (ref 70–99)
Potassium: 4 mEq/L (ref 3.5–5.1)
Sodium: 132 mEq/L — ABNORMAL LOW (ref 135–145)
Total Bilirubin: 0.3 mg/dL (ref 0.3–1.2)

## 2012-04-28 LAB — URINALYSIS, ROUTINE W REFLEX MICROSCOPIC
Bilirubin Urine: NEGATIVE
Ketones, ur: 15 mg/dL — AB
Leukocytes, UA: NEGATIVE
Nitrite: NEGATIVE
Protein, ur: NEGATIVE mg/dL
Urobilinogen, UA: 0.2 mg/dL (ref 0.0–1.0)
pH: 6 (ref 5.0–8.0)

## 2012-04-28 LAB — GLUCOSE, CAPILLARY
Glucose-Capillary: 519 mg/dL — ABNORMAL HIGH (ref 70–99)
Glucose-Capillary: 390 mg/dL — ABNORMAL HIGH (ref 70–99)
Glucose-Capillary: 371 mg/dL — ABNORMAL HIGH (ref 70–99)
Glucose-Capillary: 388 mg/dL — ABNORMAL HIGH (ref 70–99)

## 2012-04-28 LAB — CBC WITH DIFFERENTIAL/PLATELET
Basophils Absolute: 0.1 10*3/uL (ref 0.0–0.1)
Basophils Relative: 1 % (ref 0–1)
Eosinophils Relative: 1 % (ref 0–5)
Lymphocytes Relative: 47 % — ABNORMAL HIGH (ref 12–46)
MCHC: 36.7 g/dL — ABNORMAL HIGH (ref 30.0–36.0)
MCV: 86.3 fL (ref 78.0–100.0)
Monocytes Absolute: 0.2 10*3/uL (ref 0.1–1.0)
Platelets: 202 10*3/uL (ref 150–400)
RDW: 12.2 % (ref 11.5–15.5)
WBC: 4.7 10*3/uL (ref 4.0–10.5)

## 2012-04-28 LAB — ETHANOL: Alcohol, Ethyl (B): 315 mg/dL — ABNORMAL HIGH (ref 0–11)

## 2012-04-28 LAB — URINE MICROSCOPIC-ADD ON

## 2012-04-28 MED ORDER — SODIUM CHLORIDE 0.9 % IV BOLUS (SEPSIS): 1000.0000 mL | Freq: Once | INTRAVENOUS | Status: DC

## 2012-04-28 MED ORDER — INSULIN REGULAR BOLUS VIA INFUSION
0.0000 [IU] | Freq: Three times a day (TID) | INTRAVENOUS | Status: DC
  Filled 2012-04-28: qty 10

## 2012-04-28 MED ORDER — INSULIN ASPART 100 UNIT/ML ~~LOC~~ SOLN: 14.0000 [IU] | Freq: Three times a day (TID) | SUBCUTANEOUS | Status: DC

## 2012-04-28 MED ORDER — FAMOTIDINE 20 MG PO TABS: 20.0000 mg | ORAL_TABLET | Freq: Two times a day (BID) | ORAL | Status: DC

## 2012-04-28 MED ORDER — INSULIN ASPART 100 UNIT/ML ~~LOC~~ SOLN
10.0000 [IU] | Freq: Once | SUBCUTANEOUS | Status: AC
  Administered 2012-04-28: 10 [IU] via INTRAVENOUS
  Filled 2012-04-28: qty 1

## 2012-04-28 MED ORDER — SODIUM CHLORIDE 0.9 % IV SOLN
INTRAVENOUS | Status: DC
  Administered 2012-04-28: 07:00:00 via INTRAVENOUS

## 2012-04-28 MED ORDER — DEXTROSE 50 % IV SOLN: 25.0000 mL | INTRAVENOUS | Status: DC | PRN

## 2012-04-28 MED ORDER — SODIUM CHLORIDE 0.9 % IV SOLN
Freq: Once | INTRAVENOUS | Status: AC
  Administered 2012-04-28: 06:00:00 via INTRAVENOUS

## 2012-04-28 MED ORDER — SODIUM CHLORIDE 0.9 % IV BOLUS (SEPSIS)
1000.0000 mL | Freq: Once | INTRAVENOUS | Status: AC
  Administered 2012-04-28: 1000 mL via INTRAVENOUS

## 2012-04-28 MED ORDER — INSULIN GLARGINE 100 UNIT/ML ~~LOC~~ SOLN: 30.0000 [IU] | Freq: Every day | SUBCUTANEOUS | Status: DC

## 2012-04-28 MED ORDER — PANTOPRAZOLE SODIUM 40 MG IV SOLR
40.0000 mg | Freq: Once | INTRAVENOUS | Status: AC
  Administered 2012-04-28: 40 mg via INTRAVENOUS
  Filled 2012-04-28: qty 40

## 2012-04-28 MED ORDER — SODIUM CHLORIDE 0.9 % IV SOLN
INTRAVENOUS | Status: DC
  Administered 2012-04-28: 4.6 [IU]/h via INTRAVENOUS
  Filled 2012-04-28: qty 1

## 2012-04-28 NOTE — ED Notes (Signed)
Pt discharged with resources for alcohol treatment and shelter and bus pass from Child psych[facility]. NAD.

## 2012-04-28 NOTE — ED Notes (Signed)
C/o abd pain, h/o pancreatitis, admits to "drinking all day", "self medicating with ETOH b/c I cannot afford meds", pinpoints pain to BUQ, cbg also high at 465, suppose to take lantus and novolog. Arrives by EMS via w/c to triage.

## 2012-04-28 NOTE — ED Notes (Signed)
Report from John, RN.

## 2012-04-28 NOTE — ED Provider Notes (Signed)
The patient has been given information including bus passes, alcohol treatment and shelter information and has been given prescriptions for insulin, the patient has been instructed to call his family doctor today for close followup, his blood sugar has improved significantly and he appears stable for discharge.  Vida Roller, MD 04/28/12 1224

## 2012-04-28 NOTE — ED Provider Notes (Signed)
History     CSN: [id number]  Arrival date & time 04/28/12  4098   First MD Initiated Contact with Patient 04/28/12 [phone]      Chief Complaint  Patient presents with  . Abdominal Pain    (Consider location/radiation/quality/duration/timing/severity/associated sxs/prior treatment) HPI Hx per PT - severe ABD pain wraps around his ABD to his back. Sharp in quality, associated N/V has been drinking ETOH tonight. No blood in emesis. No F/C. No trauma. No diarrhea. IS diabetic, tells me he can afford alcohol but not insulin.   Past Medical History  Diagnosis Date  . Diabetes mellitus   . Hypertension   . Seizures   . Pancreatitis   . Alcohol abuse   . Chronic abdominal pain   . Alcoholism /alcohol abuse   . Hepatitis     Past Surgical History  Procedure Laterality Date  . Appendectomy      Family History  Problem Relation Age of Onset  . Hypertension      History  Substance Use Topics  . Smoking status: Former Smoker    Types: Cigarettes    Quit date: 12/21/1984  . Smokeless tobacco: Never Used  . Alcohol Use: Yes     Comment: heavy drinker: binge drinking per pt      Review of Systems  Constitutional: Negative for fever and chills.  HENT: Negative for neck pain and neck stiffness.   Eyes: Negative for pain.  Respiratory: Negative for shortness of breath.   Cardiovascular: Negative for chest pain.  Gastrointestinal: Positive for nausea and abdominal pain.  Genitourinary: Negative for dysuria.  Musculoskeletal: Negative for back pain.  Skin: Negative for rash.  Neurological: Negative for headaches.  All other systems reviewed and are negative.    Allergies  Morphine and related  Home Medications   Current Outpatient Rx  Name  Route  Sig  Dispense  Refill  . DiphenhydrAMINE HCl, Sleep, (UNISOM SLEEPGELS PO)   Oral   Take 1 tablet by mouth at bedtime as needed (sleep).         Marland Kitchen ibuprofen (ADVIL,MOTRIN) 200 MG tablet   Oral   Take 400-600 mg by  mouth every 6 (six) hours as needed for pain.           BP 111/74  Pulse 101  Temp(Src) 98.1 F (36.7 C) (Oral)  Resp 20  SpO2 100%  Physical Exam  Constitutional: He is oriented to person, place, and time. He appears well-developed and well-nourished.  HENT:  Head: Normocephalic and atraumatic.  Eyes: EOM are normal. Pupils are equal, round, and reactive to light. No scleral icterus.  Neck: Neck supple.  Cardiovascular: Normal rate, regular rhythm and intact distal pulses.   Pulmonary/Chest: Effort normal and breath sounds normal. No respiratory distress.  Abdominal: Soft. Bowel sounds are normal. He exhibits no distension. There is no rebound and no guarding.  TTP Epigastric and LUQ with vol guarding  Musculoskeletal: Normal range of motion. He exhibits no edema.  Neurological: He is alert and oriented to person, place, and time.  Skin: Skin is warm and dry.    ED Course  Procedures (including critical care time)  Results for orders placed during the hospital encounter of 04/28/12  COMPREHENSIVE METABOLIC PANEL      Result Value Range   Sodium 132 (*) 135 - 145 mEq/L   Potassium 4.0  3.5 - 5.1 mEq/L   Chloride 90 (*) 96 - 112 mEq/L   CO2 23  19 - 32 mEq/L  Glucose, Bld 538 (*) 70 - 99 mg/dL   BUN 6  6 - 23 mg/dL   Creatinine, Ser 1.61  0.50 - 1.35 mg/dL   Calcium 9.1  8.4 - 09.6 mg/dL   Total Protein 7.2  6.0 - 8.3 g/dL   Albumin 3.9  3.5 - 5.2 g/dL   AST 50 (*) 0 - 37 U/L   ALT 35  0 - 53 U/L   Alkaline Phosphatase 93  39 - 117 U/L   Total Bilirubin 0.3  0.3 - 1.2 mg/dL   GFR calc non Af Amer >90  >90 mL/min   GFR calc Af Amer >90  >90 mL/min  CBC WITH DIFFERENTIAL      Result Value Range   WBC 4.7  4.0 - 10.5 K/uL   RBC 4.45  4.22 - 5.81 MIL/uL   Hemoglobin 14.1  13.0 - 17.0 g/dL   HCT 04.5 (*) 40.9 - 81.1 %   MCV 86.3  78.0 - 100.0 fL   MCH 31.7  26.0 - 34.0 pg   MCHC 36.7 (*) 30.0 - 36.0 g/dL   RDW 91.4  78.2 - 95.6 %   Platelets 202  150 - 400 K/uL    Neutrophils Relative 46  43 - 77 %   Neutro Abs 2.2  1.7 - 7.7 K/uL   Lymphocytes Relative 47 (*) 12 - 46 %   Lymphs Abs 2.2  0.7 - 4.0 K/uL   Monocytes Relative 5  3 - 12 %   Monocytes Absolute 0.2  0.1 - 1.0 K/uL   Eosinophils Relative 1  0 - 5 %   Eosinophils Absolute 0.0  0.0 - 0.7 K/uL   Basophils Relative 1  0 - 1 %   Basophils Absolute 0.1  0.0 - 0.1 K/uL  URINALYSIS, ROUTINE W REFLEX MICROSCOPIC      Result Value Range   Color, Urine YELLOW  YELLOW   APPearance CLOUDY (*) CLEAR   Specific Gravity, Urine 1.038 (*) 1.005 - 1.030   pH 6.0  5.0 - 8.0   Glucose, UA >1000 (*) NEGATIVE mg/dL   Hgb urine dipstick TRACE (*) NEGATIVE   Bilirubin Urine NEGATIVE  NEGATIVE   Ketones, ur 15 (*) NEGATIVE mg/dL   Protein, ur NEGATIVE  NEGATIVE mg/dL   Urobilinogen, UA 0.2  0.0 - 1.0 mg/dL   Nitrite NEGATIVE  NEGATIVE   Leukocytes, UA NEGATIVE  NEGATIVE  URINE MICROSCOPIC-ADD ON      Result Value Range   Squamous Epithelial / LPF RARE  RARE   RBC / HPF 0-2  <3 RBC/hpf   Bacteria, UA RARE  RARE  LIPASE, BLOOD      Result Value Range   Lipase 14  11 - 59 U/L  ETHANOL      Result Value Range   Alcohol, Ethyl (B) 315 (*) 0 - 11 mg/dL  GLUCOSE, CAPILLARY      Result Value Range   Glucose-Capillary 519 (*) 70 - 99 mg/dL   Comment 1 Notify RN    GLUCOSE, CAPILLARY      Result Value Range   Glucose-Capillary 390 (*) 70 - 99 mg/dL   Comment 1 Notify RN     Ct Abdomen Pelvis W Contrast  04/13/2012  *RADIOLOGY REPORT*  Clinical Data: Left lower quadrant abdominal pain.  Hyperglycemia.  CT ABDOMEN AND PELVIS WITH CONTRAST  Technique:  Multidetector CT imaging of the abdomen and pelvis was performed following the standard protocol during bolus administration of intravenous  contrast.  Contrast: OMNIPAQUE IOHEXOL 300 MG/ML  SOLN  Comparison: CT of the abdomen and pelvis performed 10/23/2011  Findings: The visualized lung bases are clear.  The liver and spleen are unremarkable in  appearance.  The gallbladder is within normal limits.  A 2.5 x 1.8 cm pseudocyst is again noted at the body of the pancreas, with decreased surrounding inflammation in comparison to the prior study.  The pancreas is otherwise unremarkable.  The lungs are within normal limits.  There is very mild right-sided hydronephrosis, with prominence of the right ureter along much of its course; this may be chronic in nature, as there is no evidence of distal obstruction.  The left kidney is unremarkable in appearance.  Nonspecific perinephric stranding is noted bilaterally.  No free fluid is identified.  The small bowel is unremarkable in appearance.  The stomach is within normal limits.  No acute vascular abnormalities are seen.  Mild scattered calcification is noted along the abdominal aorta and its branches.  The patient is status post appendectomy.  The colon is unremarkable in appearance.  There is mild redundancy of the sigmoid colon.  The rectum is distended to 8.8 cm in transverse dimension, raising question for mild constipation.  The bladder is mildly distended and grossly unremarkable in appearance.  The prostate is borderline normal in size.  No inguinal lymphadenopathy is seen.  No acute osseous abnormalities are identified.  IMPRESSION:  1.  No acute abnormality seen within the abdomen or pelvis. 2.  Rectum distended with stool to 8.8 cm in transverse dimension, raising question for mild constipation. 3.  Very mild apparent right-sided hydronephrosis may be chronic in nature; there is no evidence of distal obstruction. 4.  Stable 2.5 cm pseudocyst at the body of the pancreas, with decreased surrounding inflammation in comparison to the prior study. 5.  Mild scattered calcification along the abdominal aorta and branches.   Original Report Authenticated By: Tonia Ghent, M.D.    IVFs, IV insulin, IV protonix, serial evaluations, holding any narcotics given alcohol intoxication - PT states he has vouchers and  scripts but can not afford his insulin. SWC requested - dererred to case management - can see PT in the ED after 8am  MDM  Alcoholic presents intoxicated with exacerbation of abdominal pain related to alcohol, is also hyperglycemic requiring IV insulin and IV fluids.   Old records, vital signs and nursing notes reviewed  Evaluated with labs and no indication for repeat Ct scan at this time.       Sunnie Nielsen, MD 04/28/12 [phone]

## 2012-04-28 NOTE — Progress Notes (Addendum)
Referral - Medication assistance  Discussed with pt about meds. He is unable to afford medications. Provided pt with MATCH letter. Explained program can be used once per year. He can use MATCH for his meds that he was dc home with today with a copay of $3.00. Explained narcotics could not be filled using this program. Isidoro Donning RN CCM Case Mgmt phone [phone]

## 2012-05-28 ENCOUNTER — Encounter (HOSPITAL_COMMUNITY): Payer: Self-pay | Admitting: Emergency Medicine

## 2012-05-28 ENCOUNTER — Emergency Department (HOSPITAL_COMMUNITY): Payer: Self-pay

## 2012-05-28 ENCOUNTER — Inpatient Hospital Stay (HOSPITAL_COMMUNITY): Payer: Self-pay

## 2012-05-28 ENCOUNTER — Inpatient Hospital Stay (HOSPITAL_COMMUNITY)
Admission: EM | Admit: 2012-05-28 | Discharge: 2012-05-31 | DRG: 638 | Disposition: A | Discharge status: Home / Self Care | Payer: MEDICAID | Attending: Internal Medicine | Admitting: Internal Medicine

## 2012-05-28 DIAGNOSIS — M216X9 Other acquired deformities of unspecified foot: Secondary | ICD-10-CM | POA: Diagnosis present

## 2012-05-28 DIAGNOSIS — Z8639 Personal history of other endocrine, nutritional and metabolic disease: Secondary | ICD-10-CM

## 2012-05-28 DIAGNOSIS — F3289 Other specified depressive episodes: Secondary | ICD-10-CM

## 2012-05-28 DIAGNOSIS — M21371 Foot drop, right foot: Secondary | ICD-10-CM

## 2012-05-28 DIAGNOSIS — Z79899 Other long term (current) drug therapy: Secondary | ICD-10-CM

## 2012-05-28 DIAGNOSIS — F10239 Alcohol dependence with withdrawal, unspecified: Secondary | ICD-10-CM

## 2012-05-28 DIAGNOSIS — IMO0002 Reserved for concepts with insufficient information to code with codable children: Secondary | ICD-10-CM

## 2012-05-28 DIAGNOSIS — R109 Unspecified abdominal pain: Secondary | ICD-10-CM

## 2012-05-28 DIAGNOSIS — Z91199 Patient's noncompliance with other medical treatment and regimen due to unspecified reason: Secondary | ICD-10-CM

## 2012-05-28 DIAGNOSIS — B9789 Other viral agents as the cause of diseases classified elsewhere: Secondary | ICD-10-CM

## 2012-05-28 DIAGNOSIS — Z5900 Homelessness unspecified: Secondary | ICD-10-CM

## 2012-05-28 DIAGNOSIS — K5900 Constipation, unspecified: Secondary | ICD-10-CM

## 2012-05-28 DIAGNOSIS — Z9119 Patient's noncompliance with other medical treatment and regimen: Secondary | ICD-10-CM

## 2012-05-28 DIAGNOSIS — R634 Abnormal weight loss: Secondary | ICD-10-CM

## 2012-05-28 DIAGNOSIS — I10 Essential (primary) hypertension: Secondary | ICD-10-CM

## 2012-05-28 DIAGNOSIS — G40909 Epilepsy, unspecified, not intractable, without status epilepticus: Secondary | ICD-10-CM | POA: Diagnosis present

## 2012-05-28 DIAGNOSIS — F1410 Cocaine abuse, uncomplicated: Secondary | ICD-10-CM

## 2012-05-28 DIAGNOSIS — F329 Major depressive disorder, single episode, unspecified: Secondary | ICD-10-CM

## 2012-05-28 DIAGNOSIS — Z794 Long term (current) use of insulin: Secondary | ICD-10-CM

## 2012-05-28 DIAGNOSIS — M5412 Radiculopathy, cervical region: Secondary | ICD-10-CM

## 2012-05-28 DIAGNOSIS — E1310 Other specified diabetes mellitus with ketoacidosis without coma: Principal | ICD-10-CM | POA: Diagnosis present

## 2012-05-28 DIAGNOSIS — N401 Enlarged prostate with lower urinary tract symptoms: Secondary | ICD-10-CM

## 2012-05-28 DIAGNOSIS — N138 Other obstructive and reflux uropathy: Secondary | ICD-10-CM

## 2012-05-28 DIAGNOSIS — E876 Hypokalemia: Secondary | ICD-10-CM

## 2012-05-28 DIAGNOSIS — K920 Hematemesis: Secondary | ICD-10-CM

## 2012-05-28 DIAGNOSIS — G609 Hereditary and idiopathic neuropathy, unspecified: Secondary | ICD-10-CM | POA: Diagnosis present

## 2012-05-28 DIAGNOSIS — Z862 Personal history of diseases of the blood and blood-forming organs and certain disorders involving the immune mechanism: Secondary | ICD-10-CM

## 2012-05-28 DIAGNOSIS — G4700 Insomnia, unspecified: Secondary | ICD-10-CM

## 2012-05-28 DIAGNOSIS — K863 Pseudocyst of pancreas: Secondary | ICD-10-CM

## 2012-05-28 DIAGNOSIS — K862 Cyst of pancreas: Secondary | ICD-10-CM

## 2012-05-28 DIAGNOSIS — R569 Unspecified convulsions: Secondary | ICD-10-CM

## 2012-05-28 DIAGNOSIS — K759 Inflammatory liver disease, unspecified: Secondary | ICD-10-CM | POA: Diagnosis present

## 2012-05-28 DIAGNOSIS — K861 Other chronic pancreatitis: Secondary | ICD-10-CM

## 2012-05-28 DIAGNOSIS — E782 Mixed hyperlipidemia: Secondary | ICD-10-CM

## 2012-05-28 DIAGNOSIS — Z87891 Personal history of nicotine dependence: Secondary | ICD-10-CM

## 2012-05-28 DIAGNOSIS — F10939 Alcohol use, unspecified with withdrawal, unspecified: Secondary | ICD-10-CM

## 2012-05-28 DIAGNOSIS — E1110 Type 2 diabetes mellitus with ketoacidosis without coma: Secondary | ICD-10-CM

## 2012-05-28 DIAGNOSIS — F1010 Alcohol abuse, uncomplicated: Secondary | ICD-10-CM

## 2012-05-28 DIAGNOSIS — E119 Type 2 diabetes mellitus without complications: Secondary | ICD-10-CM | POA: Diagnosis present

## 2012-05-28 LAB — CBC WITH DIFFERENTIAL/PLATELET
Eosinophils Absolute: 0 10*3/uL (ref 0.0–0.7)
Eosinophils Relative: 0 % (ref 0–5)
Hemoglobin: 13.8 g/dL (ref 13.0–17.0)
Lymphs Abs: 1 10*3/uL (ref 0.7–4.0)
MCH: 31.4 pg (ref 26.0–34.0)
MCV: 83.6 fL (ref 78.0–100.0)
Monocytes Relative: 5 % (ref 3–12)
RBC: 4.39 MIL/uL (ref 4.22–5.81)

## 2012-05-28 LAB — COMPREHENSIVE METABOLIC PANEL
BUN: 11 mg/dL (ref 6–23)
Calcium: 9.5 mg/dL (ref 8.4–10.5)
Creatinine, Ser: 0.84 mg/dL (ref 0.50–1.35)
GFR calc Af Amer: >90 mL/min (ref >90)
Glucose, Bld: 559 mg/dL (ref 70–99)
Total Protein: 7.2 g/dL (ref 6.0–8.3)

## 2012-05-28 LAB — PROTIME-INR
INR: 0.94 (ref 0.00–1.49)
Prothrombin Time: 12.5 seconds (ref 11.6–15.2)

## 2012-05-28 LAB — URINALYSIS, ROUTINE W REFLEX MICROSCOPIC
Bilirubin Urine: NEGATIVE
Ketones, ur: >80 mg/dL — AB
Nitrite: NEGATIVE
pH: 5.5 (ref 5.0–8.0)

## 2012-05-28 LAB — LIPASE, BLOOD: Lipase: 83 U/L — ABNORMAL HIGH (ref 11–59)

## 2012-05-28 LAB — URINE MICROSCOPIC-ADD ON

## 2012-05-28 LAB — RAPID URINE DRUG SCREEN, HOSP PERFORMED
Barbiturates: NOT DETECTED
Cocaine: POSITIVE — AB
Tetrahydrocannabinol: NOT DETECTED

## 2012-05-28 LAB — GLUCOSE, CAPILLARY: Glucose-Capillary: 326 mg/dL — ABNORMAL HIGH (ref 70–99)

## 2012-05-28 MED ORDER — SODIUM CHLORIDE 0.9 % IV BOLUS (SEPSIS)
1000.0000 mL | Freq: Once | INTRAVENOUS | Status: AC
  Administered 2012-05-28: 1000 mL via INTRAVENOUS

## 2012-05-28 MED ORDER — THIAMINE HCL 100 MG/ML IJ SOLN
100.0000 mg | Freq: Once | INTRAMUSCULAR | Status: AC
  Administered 2012-05-28: 100 mg via INTRAVENOUS
  Filled 2012-05-28: qty 2

## 2012-05-28 MED ORDER — INSULIN ASPART 100 UNIT/ML IV SOLN
5.0000 [IU] | Freq: Once | INTRAVENOUS | Status: AC
  Administered 2012-05-28: 5 [IU] via INTRAVENOUS
  Filled 2012-05-28: qty 0.05

## 2012-05-28 MED ORDER — SODIUM CHLORIDE 0.9 % IV SOLN
INTRAVENOUS | Status: DC
  Administered 2012-05-29: via INTRAVENOUS
  Filled 2012-05-28 (×2): qty 1

## 2012-05-28 MED ORDER — ONDANSETRON HCL 4 MG/2ML IJ SOLN
4.0000 mg | Freq: Once | INTRAMUSCULAR | Status: AC
  Administered 2012-05-28: 4 mg via INTRAVENOUS
  Filled 2012-05-28: qty 2

## 2012-05-28 MED ORDER — HYDROMORPHONE HCL PF 1 MG/ML IJ SOLN
1.0000 mg | Freq: Once | INTRAMUSCULAR | Status: AC
  Administered 2012-05-28: 1 mg via INTRAVENOUS
  Filled 2012-05-28: qty 1

## 2012-05-28 NOTE — ED Notes (Signed)
Pt transported to radiology.

## 2012-05-28 NOTE — ED Notes (Signed)
Per EMS pt came from home c/o numbness to all extremities especially right leg from knee to toe for the past 5 days.  Pt also c/o HA and N/V/D and abdominal pain since last night. Pt reports hx of pancreatitis and had a beer a few days ago. Pt also reports he has hx of DM and epilepsy and has not had meds in 1 month.

## 2012-05-28 NOTE — H&P (Signed)
[facility] History and Physical  Gabriel Rush VWU:[id number] DOB: [birth year] DOA: 05/28/2012  Referring physician: Dr. Franky Macho. PCP: No PCP Per Patient  Specialists: None.  Chief Complaint: Abdominal pain.  HPI: Gabriel Rush is a 52 y.o. male with known history of diabetes mellitus type 2, hypertension and chronic pancreatitis with polysubstance abuse including alcohol and cocaine presented to the ER because of nausea vomiting with abdominal pain. Patient states his symptoms started last night with nausea vomiting and abdominal pain. The pain is mostly located in the epigastric area radiating to the back. Patient also has been having couple episodes of diarrhea. Denies any fever chills. Denies any blood in the vomitus. In addition patient has noticed increased weakness in the right leg where he is not able to dorsiflex the foot for the last one week. Denies any falls or trauma. There is no weakness in other extremities or denies any associated slurred speech difficulty swallowing or blood lesion. CT head was negative for any acute. Patient in the ER was found to be in DKA and has been started on IV insulin infusion and will be admitted for further management. Patient states he has been not taking his medications for more than a month.  Review of Systems: As presented in the history of presenting illness, rest negative.  Past Medical History  Diagnosis Date  . Diabetes mellitus   . Hypertension   . Seizures   . Pancreatitis   . Alcohol abuse   . Chronic abdominal pain   . Alcoholism /alcohol abuse   . Hepatitis    Past Surgical History  Procedure Laterality Date  . Appendectomy     Social History:  reports that he quit smoking about 27 years ago. His smoking use included Cigarettes. He smoked 0.00 packs per day. He has never used smokeless tobacco. He reports that  drinks alcohol. He reports that he uses illicit drugs (Marijuana). Lives at home. where does patient  live-- Can do ADLs. Can patient participate in ADLs?  Allergies  Allergen Reactions  . Morphine And Related Itching    Family History  Problem Relation Age of Onset  . Hypertension        Prior to Admission medications   Not on File   Physical Exam: Filed Vitals:   05/28/12 2200 05/28/12 2215 05/28/12 2230 05/28/12 2245  BP: 116/89 122/90 118/85 120/87  Pulse: 89 90 83 86  Temp:      TempSrc:      Resp: 23 14 13 15   SpO2: 100% 100% 100% 100%     General:  Well-developed well-nourished.  Eyes: Anicteric no pallor. PERRLA positive.  ENT: No facial asymmetry. Tongue is midline. No discharge from ears eyes nose and mouth.  Neck: No mass felt.  Cardiovascular: S1-S2 heard.  Respiratory: No rhonchi no crepitations.  Abdomen: Soft nontender bowel sounds present. No guarding or rigidity.  Skin: No rash.  Musculoskeletal: No edema.  Psychiatric: Appears normal.  Neurologic: Alert awake oriented to time place and person. Patient has a right footdrop. No facial asymmetry and tongue is midline.  Labs on Admission:  Basic Metabolic Panel:  Recent Labs Lab 05/28/12 1841  NA 128*  K 4.4  CL 86*  CO2 18*  GLUCOSE 559*  BUN 11  CREATININE 0.84  CALCIUM 9.5   Liver Function Tests:  Recent Labs Lab 05/28/12 1841  AST 18  ALT 26  ALKPHOS 82  BILITOT 0.5  PROT 7.2  ALBUMIN 3.8    Recent Labs Lab  05/28/12 1841 05/28/12 1911  LIPASE 83*  --   AMYLASE  --  81   No results found for this basename: AMMONIA,  in the last 168 hours CBC:  Recent Labs Lab 05/28/12 1841  WBC 4.4  NEUTROABS 3.2  HGB 13.8  HCT 36.7*  MCV 83.6  PLT 203   Cardiac Enzymes:  Recent Labs Lab 05/28/12 1911  TROPONINI <0.30    BNP (last 3 results) No results found for this basename: PROBNP,  in the last 8760 hours CBG: No results found for this basename: GLUCAP,  in the last 168 hours  Radiological Exams on Admission: Ct Head Wo Contrast  05/28/2012  *RADIOLOGY  REPORT*  Clinical Data: Severe headache  CT HEAD WITHOUT CONTRAST  Technique:  Contiguous axial images were obtained from the base of the skull through the vertex without contrast.  Comparison: None.  Findings: Mild atrophy for age.  No acute infarct.  Negative for intracranial hemorrhage or mass.  Soft tissue swelling of the scalp right parietal region.  This could represent a scalp hematoma however no history of trauma is given.  This could also represent a benign or malignant skin mass lesion.  No skull abnormality is identified.  IMPRESSION: Mild atrophy.  No acute intracranial abnormality.  Skin thickening right parietal scalp.  Recommend evaluation for injury or hematoma in the area.   Original Report Authenticated By: Janeece Riggers, M.D.     EKG: Independently reviewed. Normal sinus rhythm.  Assessment/Plan Active Problems:   ALCOHOL ABUSE   HYPERTENSION, BENIGN ESSENTIAL   Chronic pancreatitis   DKA (diabetic ketoacidoses)   Right foot drop   Cocaine abuse   1. Diabetic ketoacidosis - probably precipitated by nausea vomiting and also noncompliant with medications. Continue with IV insulin infusion until anion gap gets corrected after which change to long-acting insulin. Check hemoglobin A1c. Closely follow metabolic panel intake output. 2. Right footdrop - I have discussed with on-call urologist Dr. Lu Duffel who advised at this time to correct his metabolic derangements and check MRI brain and if symptoms persist to consult neurologist. Patient will be placed on neurochecks. 3. Polysubstance abuse including cocaine and alcohol - patient advised to quit these habits. Patient has been placed on alcohol withdrawal protocol. Last drink was more than 2 days ago. 4. Abdominal pain with nausea and vomiting and diarrhea - probably related to acute on chronic pancreatitis. Acute abdominal series has been ordered. Pain medications. 5. Hypertension - patient noncompliant with medications. Patient has  been placed on when necessary IV hydralazine for systolic blood pressure more than 160.    Code Status: Full code.  Family Communication: None.  Disposition Plan: Admit to inpatient.    Alphons Burgert N. [facility] Pager [phone].  If 7PM-7AM, please contact night-coverage [email or web address] Password [facility] 05/28/2012, 11:33 PM

## 2012-05-28 NOTE — ED Notes (Signed)
Pt returned from radiology.

## 2012-05-28 NOTE — ED Notes (Signed)
MD at bedside. 

## 2012-05-28 NOTE — ED Provider Notes (Addendum)
History     CSN: [id number]  Arrival date & time 05/28/12  1826   First MD Initiated Contact with Gabriel Rush 05/28/12 1831      Chief Complaint  Gabriel Rush presents with  . Hyperglycemia  . Numbness  . Abdominal Pain    (Consider location/radiation/quality/duration/timing/severity/associated sxs/prior treatment) HPI Comments: Gabriel Rush comes to the ER with complaints of severe abdominal pain. Gabriel Rush reports that he has been out of all his medications for one month. He says he has a history of pancreatitis but has not been drinking in the last few days. Pain started last night. Pain is sharp, stabbing and severe, upper abdomen wraps around to the back. He has had nausea, vomiting and diarrhea associated with the symptoms.  Gabriel Rush also complains of a headache. This started yesterday as well. Gabriel Rush reports sharp stabbing diffuse pain that is severe. Was slow in onset and progressively worsened. Gabriel Rush reports that for at least 2 weeks he has had numbness, tingling and weakness of his right leg. Symptoms are from the knee down. He says he is having trouble talking, "has to drag his leg everywhere he goes".  Gabriel Rush is a 52 y.o. male presenting with abdominal pain.  Abdominal Pain Associated symptoms: diarrhea, nausea and vomiting     Past Medical History  Diagnosis Date  . Diabetes mellitus   . Hypertension   . Seizures   . Pancreatitis   . Alcohol abuse   . Chronic abdominal pain   . Alcoholism /alcohol abuse   . Hepatitis     Past Surgical History  Procedure Laterality Date  . Appendectomy      Family History  Problem Relation Age of Onset  . Hypertension      History  Substance Use Topics  . Smoking status: Former Smoker    Types: Cigarettes    Quit date: 12/21/1984  . Smokeless tobacco: Never Used  . Alcohol Use: Yes     Comment: heavy drinker: binge drinking per pt      Review of Systems  Gastrointestinal: Positive for nausea, vomiting, abdominal pain and  diarrhea.  Neurological: Positive for weakness, numbness and headaches.  All other systems reviewed and are negative.    Allergies  Morphine and related  Home Medications  No current outpatient prescriptions on file.  BP 130/94  Pulse 92  Temp(Src) 97.8 F (36.6 C) (Oral)  Resp 18  SpO2 100%  Physical Exam  Constitutional: He is oriented to person, place, and time. He appears well-developed and well-nourished. No distress.  HENT:  Head: Normocephalic and atraumatic.  Right Ear: Hearing normal.  Nose: Nose normal.  Mouth/Throat: Oropharynx is clear and moist and mucous membranes are normal.  Eyes: Conjunctivae and EOM are normal. Pupils are equal, round, and reactive to light.  Neck: Normal range of motion. Neck supple.  Cardiovascular: Normal rate, regular rhythm, S1 normal and S2 normal.  Exam reveals no gallop and no friction rub.   No murmur heard. Pulmonary/Chest: Effort normal and breath sounds normal. No respiratory distress. He exhibits no tenderness.  Abdominal: Soft. Normal appearance and bowel sounds are normal. There is no hepatosplenomegaly. There is generalized tenderness. There is no rebound, no guarding, no tenderness at McBurney's point and negative Murphy's sign. No hernia.  Musculoskeletal: Normal range of motion.  Neurological: He is alert and oriented to person, place, and time. A sensory deficit is present. No cranial nerve deficit. He exhibits abnormal muscle tone. Coordination normal. GCS eye subscore is 4. GCS verbal subscore is  5. GCS motor subscore is 6.  Gabriel Rush has decreased strength right lower extremity from knee down. Symptoms are most pronounced at the ankle, has decreased flexion. Gabriel Rush also subjectively has decreased sensation in the same distribution. Upper leg is normal.  Skin: Skin is warm, dry and intact. No rash noted. No cyanosis.  Psychiatric: He has a normal mood and affect. His speech is normal and behavior is normal. Thought content  normal.    ED Course  Procedures (including critical care time)  EKG:  Date: 05/28/2012  Rate: 87  Rhythm: normal sinus rhythm  QRS Axis: normal  Intervals: PR shortened  ST/T Wave abnormalities: nonspecific ST changes  Conduction Disutrbances:none  Narrative Interpretation:   Old EKG Reviewed: unchanged    Labs Reviewed  CBC WITH DIFFERENTIAL - Abnormal; Notable for the following:    HCT 36.7 (*)    MCHC 37.6 (*)    All other components within normal limits  COMPREHENSIVE METABOLIC PANEL - Abnormal; Notable for the following:    Sodium 128 (*)    Chloride 86 (*)    CO2 18 (*)    Glucose, Bld 559 (*)    All other components within normal limits  LIPASE, BLOOD - Abnormal; Notable for the following:    Lipase 83 (*)    All other components within normal limits  URINALYSIS, ROUTINE W REFLEX MICROSCOPIC - Abnormal; Notable for the following:    Specific Gravity, Urine 1.039 (*)    Glucose, UA >1000 (*)    Ketones, ur >80 (*)    All other components within normal limits  URINE RAPID DRUG SCREEN (HOSP PERFORMED) - Abnormal; Notable for the following:    Cocaine POSITIVE (*)    All other components within normal limits  KETONES, QUALITATIVE - Abnormal; Notable for the following:    Acetone, Bld LARGE (*)    All other components within normal limits  TROPONIN I  PROTIME-INR  AMYLASE  ETHANOL  URINE MICROSCOPIC-ADD ON   Ct Head Wo Contrast  05/28/2012  *RADIOLOGY REPORT*  Clinical Data: Severe headache  CT HEAD WITHOUT CONTRAST  Technique:  Contiguous axial images were obtained from the base of the skull through the vertex without contrast.  Comparison: None.  Findings: Mild atrophy for age.  No acute infarct.  Negative for intracranial hemorrhage or mass.  Soft tissue swelling of the scalp right parietal region.  This could represent a scalp hematoma however no history of trauma is given.  This could also represent a benign or malignant skin mass lesion.  No skull  abnormality is identified.  IMPRESSION: Mild atrophy.  No acute intracranial abnormality.  Skin thickening right parietal scalp.  Recommend evaluation for injury or hematoma in the area.   Original Report Authenticated By: Janeece Riggers, M.D.      Diagnoses: 1. DKA 2. Abdominal pain 3. 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 Gabriel Rush has a history of alcoholism and pancreatitis. He denies drinking over last several days, has had onset of pain with nausea, vomiting and diarrhea. Gabriel Rush has essentially normal amylase and lipase, however. No sign of pancreatitis.  Gabriel Rush's blood sugar is markedly elevated. He admits that he has not taken any of his medications for more than a month. Workup is consistent with diabetic ketoacidosis. Gabriel Rush will require hospitalization for further management. He was treated with IV insulin and IV fluids here in the ER. Will be admitted on an insulin drip.  Gabriel Rush  also complaining of decreased ability to lift his foot. Examination does show decreased ability to dorsiflex at the ankle. He does not have any back pain. There is no proximal leg pain or tenderness. I do not feel that this is secondary to lumbar impingement syndrome. 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 CT scan did not show any obvious CVA. This can be evaluated further during hospitalization.  Case discussed with Dr. Toniann Fail. Will admit to SDU.        Gilda Crease, MD 05/28/12 2241  Gilda Crease, MD 05/28/12 2244

## 2012-05-28 NOTE — ED Notes (Signed)
EKG handed to Dr. Blinda Leatherwood. Critical lab result reported.

## 2012-05-28 NOTE — ED Notes (Signed)
Pt c/o numbness to right leg. Sharp and dull sensation intact in all extremities.

## 2012-05-29 ENCOUNTER — Inpatient Hospital Stay (HOSPITAL_COMMUNITY): Payer: MEDICAID

## 2012-05-29 DIAGNOSIS — Z9119 Patient's noncompliance with other medical treatment and regimen: Secondary | ICD-10-CM

## 2012-05-29 DIAGNOSIS — F1010 Alcohol abuse, uncomplicated: Secondary | ICD-10-CM

## 2012-05-29 DIAGNOSIS — E1110 Type 2 diabetes mellitus with ketoacidosis without coma: Secondary | ICD-10-CM

## 2012-05-29 DIAGNOSIS — F1410 Cocaine abuse, uncomplicated: Secondary | ICD-10-CM

## 2012-05-29 DIAGNOSIS — Z91199 Patient's noncompliance with other medical treatment and regimen due to unspecified reason: Secondary | ICD-10-CM

## 2012-05-29 DIAGNOSIS — R634 Abnormal weight loss: Secondary | ICD-10-CM | POA: Diagnosis present

## 2012-05-29 DIAGNOSIS — K862 Cyst of pancreas: Secondary | ICD-10-CM

## 2012-05-29 DIAGNOSIS — R569 Unspecified convulsions: Secondary | ICD-10-CM

## 2012-05-29 DIAGNOSIS — K863 Pseudocyst of pancreas: Secondary | ICD-10-CM

## 2012-05-29 LAB — BASIC METABOLIC PANEL
BUN: 9 mg/dL (ref 6–23)
BUN: 8 mg/dL (ref 6–23)
BUN: 9 mg/dL (ref 6–23)
CO2: 18 mEq/L — ABNORMAL LOW (ref 19–32)
CO2: 22 mEq/L (ref 19–32)
CO2: 23 mEq/L (ref 19–32)
Calcium: 8.7 mg/dL (ref 8.4–10.5)
Chloride: 96 mEq/L (ref 96–112)
Chloride: 99 mEq/L (ref 96–112)
Creatinine, Ser: 0.6 mg/dL (ref 0.50–1.35)
Creatinine, Ser: 0.6 mg/dL (ref 0.50–1.35)
GFR calc Af Amer: >90 mL/min (ref >90)
Glucose, Bld: 310 mg/dL — ABNORMAL HIGH (ref 70–99)
Glucose, Bld: 200 mg/dL — ABNORMAL HIGH (ref 70–99)
Glucose, Bld: 241 mg/dL — ABNORMAL HIGH (ref 70–99)
Potassium: 3.4 mEq/L — ABNORMAL LOW (ref 3.5–5.1)
Sodium: 133 mEq/L — ABNORMAL LOW (ref 135–145)

## 2012-05-29 LAB — GLUCOSE, CAPILLARY
Glucose-Capillary: 164 mg/dL — ABNORMAL HIGH (ref 70–99)
Glucose-Capillary: 136 mg/dL — ABNORMAL HIGH (ref 70–99)
Glucose-Capillary: 124 mg/dL — ABNORMAL HIGH (ref 70–99)
Glucose-Capillary: 177 mg/dL — ABNORMAL HIGH (ref 70–99)
Glucose-Capillary: 261 mg/dL — ABNORMAL HIGH (ref 70–99)
Glucose-Capillary: 306 mg/dL — ABNORMAL HIGH (ref 70–99)
Glucose-Capillary: 198 mg/dL — ABNORMAL HIGH (ref 70–99)

## 2012-05-29 LAB — LIPID PANEL
Cholesterol: 318 mg/dL — ABNORMAL HIGH (ref 0–200)
HDL: 47 mg/dL (ref >39)
LDL Cholesterol: 238 mg/dL — ABNORMAL HIGH (ref 0–99)
Total CHOL/HDL Ratio: 6.8 RATIO
Triglycerides: 165 mg/dL — ABNORMAL HIGH (ref <150)
VLDL: 33 mg/dL (ref 0–40)

## 2012-05-29 MED ORDER — MIRTAZAPINE 15 MG PO TABS
15.0000 mg | ORAL_TABLET | Freq: Every day | ORAL | Status: DC
  Administered 2012-05-29 – 2012-05-30 (×2): 15 mg via ORAL
  Filled 2012-05-29 (×4): qty 1

## 2012-05-29 MED ORDER — HYDROMORPHONE HCL PF 1 MG/ML IJ SOLN
1.0000 mg | INTRAMUSCULAR | Status: DC | PRN
  Administered 2012-05-29 (×6): 1 mg via INTRAVENOUS
  Filled 2012-05-29 (×6): qty 1

## 2012-05-29 MED ORDER — SODIUM CHLORIDE 0.9 % IV SOLN
INTRAVENOUS | Status: DC
  Administered 2012-05-29: 1.3 [IU]/h via INTRAVENOUS
  Administered 2012-05-29: 2.3 [IU]/h via INTRAVENOUS
  Filled 2012-05-29: qty 1

## 2012-05-29 MED ORDER — ASPIRIN 325 MG PO TABS
325.0000 mg | ORAL_TABLET | Freq: Every day | ORAL | Status: DC
  Administered 2012-05-29 – 2012-05-31 (×3): 325 mg via ORAL
  Filled 2012-05-29 (×3): qty 1

## 2012-05-29 MED ORDER — PHENYTOIN 50 MG PO CHEW
200.0000 mg | CHEWABLE_TABLET | Freq: Every day | ORAL | Status: DC
  Administered 2012-05-29 – 2012-05-30 (×2): 200 mg via ORAL
  Filled 2012-05-29 (×4): qty 4

## 2012-05-29 MED ORDER — SODIUM CHLORIDE 0.9 % IV SOLN
INTRAVENOUS | Status: DC
  Administered 2012-05-29 – 2012-05-31 (×2): via INTRAVENOUS

## 2012-05-29 MED ORDER — LORAZEPAM 2 MG/ML IJ SOLN: 0.0000 mg | Freq: Two times a day (BID) | INTRAMUSCULAR | Status: DC

## 2012-05-29 MED ORDER — LORAZEPAM 2 MG/ML IJ SOLN
0.0000 mg | Freq: Four times a day (QID) | INTRAMUSCULAR | Status: AC
  Administered 2012-05-29 (×2): 1 mg via INTRAVENOUS
  Administered 2012-05-30: 2 mg via INTRAVENOUS
  Administered 2012-05-30: 1 mg via INTRAVENOUS
  Filled 2012-05-29: qty 1

## 2012-05-29 MED ORDER — INSULIN ASPART 100 UNIT/ML ~~LOC~~ SOLN: 0.0000 [IU] | Freq: Every day | SUBCUTANEOUS | Status: DC

## 2012-05-29 MED ORDER — ENOXAPARIN SODIUM 40 MG/0.4ML ~~LOC~~ SOLN: 40.0000 mg | SUBCUTANEOUS | Status: DC

## 2012-05-29 MED ORDER — AMLODIPINE BESYLATE 5 MG PO TABS
5.0000 mg | ORAL_TABLET | Freq: Every day | ORAL | Status: DC
  Administered 2012-05-29 – 2012-05-31 (×3): 5 mg via ORAL
  Filled 2012-05-29 (×3): qty 1

## 2012-05-29 MED ORDER — INSULIN GLARGINE 100 UNIT/ML ~~LOC~~ SOLN
30.0000 [IU] | Freq: Every day | SUBCUTANEOUS | Status: DC
  Administered 2012-05-29: 30 [IU] via SUBCUTANEOUS
  Filled 2012-05-29 (×2): qty 0.3

## 2012-05-29 MED ORDER — CHLORDIAZEPOXIDE HCL 10 MG PO CAPS
10.0000 mg | ORAL_CAPSULE | Freq: Three times a day (TID) | ORAL | Status: DC
  Administered 2012-05-29: 10 mg via ORAL
  Filled 2012-05-29: qty 1

## 2012-05-29 MED ORDER — GABAPENTIN 300 MG PO CAPS
300.0000 mg | ORAL_CAPSULE | Freq: Three times a day (TID) | ORAL | Status: DC
  Administered 2012-05-29 – 2012-05-31 (×7): 300 mg via ORAL
  Filled 2012-05-29 (×10): qty 1

## 2012-05-29 MED ORDER — CHLORDIAZEPOXIDE HCL 5 MG PO CAPS
10.0000 mg | ORAL_CAPSULE | Freq: Three times a day (TID) | ORAL | Status: DC
  Administered 2012-05-29 – 2012-05-31 (×4): 10 mg via ORAL
  Filled 2012-05-29: qty 1
  Filled 2012-05-29 (×2): qty 2
  Filled 2012-05-29: qty 1

## 2012-05-29 MED ORDER — POTASSIUM CHLORIDE 10 MEQ/100ML IV SOLN
10.0000 meq | INTRAVENOUS | Status: AC
  Administered 2012-05-29 (×4): 10 meq via INTRAVENOUS
  Filled 2012-05-29: qty 400

## 2012-05-29 MED ORDER — GLUCERNA SHAKE PO LIQD
237.0000 mL | Freq: Three times a day (TID) | ORAL | Status: DC
  Administered 2012-05-29 – 2012-05-31 (×5): 237 mL via ORAL
  Filled 2012-05-29: qty 237

## 2012-05-29 MED ORDER — VITAMIN B-1 100 MG PO TABS
100.0000 mg | ORAL_TABLET | Freq: Every day | ORAL | Status: DC
  Administered 2012-05-29 – 2012-05-31 (×3): 100 mg via ORAL
  Filled 2012-05-29 (×3): qty 1

## 2012-05-29 MED ORDER — LORAZEPAM 2 MG/ML IJ SOLN
1.0000 mg | Freq: Four times a day (QID) | INTRAMUSCULAR | Status: DC | PRN
  Administered 2012-05-30: 1 mg via INTRAVENOUS
  Filled 2012-05-29 (×4): qty 1

## 2012-05-29 MED ORDER — INSULIN ASPART 100 UNIT/ML ~~LOC~~ SOLN
4.0000 [IU] | Freq: Three times a day (TID) | SUBCUTANEOUS | Status: DC
  Administered 2012-05-29 – 2012-05-30 (×5): 4 [IU] via SUBCUTANEOUS

## 2012-05-29 MED ORDER — DEXTROSE-NACL 5-0.45 % IV SOLN
INTRAVENOUS | Status: DC
  Administered 2012-05-29: 04:00:00 via INTRAVENOUS

## 2012-05-29 MED ORDER — ENOXAPARIN SODIUM 40 MG/0.4ML ~~LOC~~ SOLN
40.0000 mg | SUBCUTANEOUS | Status: DC
  Filled 2012-05-29: qty 0.4

## 2012-05-29 MED ORDER — SODIUM CHLORIDE 0.9 % IV SOLN
INTRAVENOUS | Status: DC
  Administered 2012-05-29: 01:00:00 via INTRAVENOUS

## 2012-05-29 MED ORDER — SODIUM CHLORIDE 0.9 % IV SOLN
INTRAVENOUS | Status: DC
  Administered 2012-05-29 – 2012-05-30 (×3): via INTRAVENOUS

## 2012-05-29 MED ORDER — DEXTROSE 50 % IV SOLN: 25.0000 mL | INTRAVENOUS | Status: DC | PRN

## 2012-05-29 MED ORDER — THIAMINE HCL 100 MG/ML IJ SOLN
100.0000 mg | Freq: Every day | INTRAMUSCULAR | Status: DC
  Filled 2012-05-29 (×3): qty 1

## 2012-05-29 MED ORDER — LORAZEPAM 1 MG PO TABS
1.0000 mg | ORAL_TABLET | Freq: Four times a day (QID) | ORAL | Status: DC | PRN
  Administered 2012-05-29: 1 mg via ORAL
  Filled 2012-05-29: qty 1

## 2012-05-29 MED ORDER — FOLIC ACID 1 MG PO TABS
1.0000 mg | ORAL_TABLET | Freq: Every day | ORAL | Status: DC
  Administered 2012-05-29 – 2012-05-31 (×3): 1 mg via ORAL
  Filled 2012-05-29 (×3): qty 1

## 2012-05-29 MED ORDER — ADULT MULTIVITAMIN W/MINERALS CH
1.0000 | ORAL_TABLET | Freq: Every day | ORAL | Status: DC
  Administered 2012-05-29 – 2012-05-31 (×3): 1 via ORAL
  Filled 2012-05-29 (×3): qty 1

## 2012-05-29 MED ORDER — INSULIN ASPART 100 UNIT/ML ~~LOC~~ SOLN
0.0000 [IU] | Freq: Three times a day (TID) | SUBCUTANEOUS | Status: DC
  Administered 2012-05-29: 11 [IU] via SUBCUTANEOUS
  Administered 2012-05-29: 8 [IU] via SUBCUTANEOUS
  Administered 2012-05-30: 5 [IU] via SUBCUTANEOUS
  Administered 2012-05-30: 15 [IU] via SUBCUTANEOUS
  Administered 2012-05-31: 3 [IU] via SUBCUTANEOUS
  Administered 2012-05-31: 5 [IU] via SUBCUTANEOUS

## 2012-05-29 NOTE — Progress Notes (Signed)
[facility] Progress Note [place] TEAM 1 - Stepdown/ICU TEAM   Shiraz Bastyr ZOX:[id number] DOB: [birth year] DOA: 05/28/2012 PCP: No PCP Per Patient  Brief narrative: 52 year old male patient with repeated admissions for DKA. He has known diabetes mellitus hypertension and chronic pancreatitis. He also has polysubstance abuse primarily consisting of alcohol and cocaine. He presented once again to the emergency department because of nausea with vomiting and abdominal pain. On previous admission his hemoglobin A1c was documented at 13.8. Previous admission not too long ago revealed actual improvement in the size of a chronic pancreatic pseudocyst and no evidence of acute pancreatitis. As per his usual presentation his pain is mainly epigastric radiating to the back and some episodes of diarrhea. No other constitutional symptoms or hematemesis. He also noticed increased weakness in the right leg and has been unable to dorsiflex the foot for one week. No apparent traumatic injury to same foot. No other focal neurological deficits reported. CT of the head in the emergency department was negative for any acute injury. At presentation his serum glucose was 559 and was mildly acidotic with a CO2 of 18 therefore he was started on an insulin infusion.  Since admission we have spoken to the patient and he has been unable to maintain any prescribed followup physician visits and has been unable to obtain his medications per his report. This was confirmed by review of the medication reconciliation which demonstrated that patient is not taking any medications. He was last discharged in September 2013 with a long list of medications including medications for peripheral neuropathy, diabetes and treatment of seizures. He was to follow up with the [facility] but apparently has not done so. It is also reported that the patient is homeless. Per nursing report this patient previously had been cared for by  his mother but his mother had recently died and her concerns patient may not have the capacity to care for himself in outpatient setting.  Assessment/Plan: Active Problems:   DKA (diabetic ketoacidoses)/insulin dependent diabetes -HgbA1c 9/13 was 13.8- repeat this admission -transition from insulin gtt to Lantus w/SSI and meal coverage -cont IVF since seems a little dry  Chronic pancreatitis/  PSEUDOCYST, PANCREAS -Lipase elevated at 83 so will back off to clear liquids and follow -has diffuse abd pain with some focal pain in mid abdomen -check CBC- if pain continues, lipase increases or fever develops will need to check CT abd -once diet advanced will need to resume Creon   Right foot drop/known peripheral neuropathy -MRI brain pending but sx's likely due to known neuropathy and ? 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 Group home   DVT prophylaxis: SCDs Code Status: Full Family Communication: Patient Disposition Plan: Transfer to floor Isolation: None  Consultants: Psychiatry  Procedures: None  Antibiotics: None  HPI/Subjective: Patient  alert and seems to answer yes to all questions asked. He continues to endorse diffuse abdominal discomfort that radiates to his back. Denies chest  pain or shortness of breath.   Objective: Blood pressure 117/90, pulse 82, temperature 98.5 F (36.9 C), temperature source Oral, resp. rate 15, height 5\' 11"  (1.803 m), weight 72 kg (158 lb 11.7 oz), SpO2 97.00%.  Intake/Output Summary (Last 24 hours) at 05/29/12 1059 Last data filed at 05/29/12 1034  Gross per 24 hour  Intake 2713.91 ml  Output   1600 ml  Net 1113.91 ml     Exam: General: No acute respiratory distress Lungs: Clear to auscultation bilaterally without wheezes or crackles, RA Cardiovascular: Regular rate and rhythm without murmur gallop or rub normal S1 and S2, no peripheral edema or JVD Abdomen: Nontender, nondistended, soft, bowel sounds positive, no rebound, no ascites, no appreciable mass Musculoskeletal: No significant cyanosis, clubbing of bilateral lower extremities Neurological: Alert and oriented x 3, moves all extremities x 4 without focal neurological deficits, CN 2-13 intact, minimal right foot drop and patient primarily complains of issues with the foot with dorsiflexion-no weakness appreciated on exam  Data Reviewed: Basic Metabolic Panel:  Recent Labs Lab 05/28/12 1841 05/29/12 0100 05/29/12 0300 05/29/12 0451  NA 128* 132* 133* 133*  K 4.4 3.4* 3.4* 3.5  CL 86* 96 99 100  CO2 18* 18* 22 23  GLUCOSE 559* 310* 200* 241*  BUN 11 9 8 9   CREATININE 0.84 0.60 0.60 0.60  CALCIUM 9.5 8.7 8.8 8.7   Liver Function Tests:  Recent Labs Lab 05/28/12 1841  AST 18  ALT 26  ALKPHOS 82  BILITOT 0.5  PROT 7.2  ALBUMIN 3.8    Recent Labs Lab 05/28/12 1841 05/28/12 1911  LIPASE 83*  --   AMYLASE  --  81   No results found for this basename: AMMONIA,  in the last 168 hours CBC:  Recent Labs Lab 05/28/12 1841  WBC 4.4  NEUTROABS 3.2  HGB 13.8  HCT 36.7*  MCV 83.6  PLT 203   Cardiac Enzymes:  Recent Labs Lab 05/28/12 1911 05/29/12 0100 05/29/12 0650  TROPONINI <0.30 <0.30 <0.30   BNP (last 3 results) No results found for  this basename: PROBNP,  in the last 8760 hours CBG:  Recent Labs Lab 05/29/12 0400 05/29/12 0504 05/29/12 0607 05/29/12 0658 05/29/12 0804  GLUCAP 164* 200* 163* 136* 124*    Recent Results (from the past 240 hour(s))  MRSA PCR SCREENING     Status: None   Collection Time    05/29/12 12:15 AM      Result Value Range Status   MRSA by PCR NEGATIVE  NEGATIVE Final   Comment:            The GeneXpert MRSA Assay (FDA     approved for NASAL specimens     only), is one component of a     comprehensive MRSA colonization     surveillance program. It is not     intended to diagnose MRSA     infection nor to guide or     monitor treatment for     MRSA infections.     Studies:  Recent x-ray studies have been reviewed in detail by the Attending Physician  Scheduled Meds:  Reviewed in detail by the Attending Physician   Junious Silk, ANP [facility] Office  [phone] Pager [phone]  On-Call/Text Page:      [email or web address]  password [email or web address]  If 7PM-7AM, please contact night-coverage [email or web address] Password [email or web address] 05/29/2012, 10:59 AM   LOS: 1 day

## 2012-05-29 NOTE — Consult Note (Signed)
Reason for Consult: Capacity evaluation Referring Physician: Eduard Clos, MD  Gabriel Rush is an 52 y.o. male.  HPI: Patient was seen and the chart reviewed. Psychiatric consultation was requested for capacity evaluation. Patient has been suffering with substance abuse mood disorder, alcohol dependence and cocaine abuse. Patient reported he became homeless since his mother passed today they 2 months ago because of stroke. Patient reported he was working on being 2005 and then went to [place] on the 2008. Patient stated he was relocated to [facility] to care for his mother. Since his mother passed today they he has been relapsed with a drug of abuse and unable to care for himself. Patient and [place] his symptoms also depression, disturbance of sleep and appetite. Reportedly he has been drinking liquor and beer as much he as he can get about so using cocaine at least 2 g at the time.  Patient has a repeated admissions for DKA. He has known diabetes mellitus hypertension and chronic pancreatitis. He presented once again to the emergency department because of nausea with vomiting and abdominal pain. On previous admission his hemoglobin A1c was documented at 13.8. Previous admission not too long ago revealed actual improvement in the size of a chronic pancreatic pseudocyst and no evidence of acute pancreatitis. As per his usual presentation his pain is mainly epigastric radiating to the back and some episodes of diarrhea. No other constitutional symptoms or hematemesis. He also noticed increased weakness in the right leg and has been unable to dorsiflex the foot for one week. No apparent traumatic injury to same foot. No other focal neurological deficits reported. CT of the head in the emergency department was negative for any acute injury. At presentation his serum glucose was 559 and was mildly acidotic with a CO2 of 18 therefore he was started on an insulin infusion.   Mental Status Examination:  Patient appeared as per his stated age, weak and tired. He has maintaining good eye contact. Patient has  unhappy  mood and his affect was constricted. He has normal rate, rhythm, and volume of speech. His thought process is linear and goal directed. Patient has denied suicidal, homicidal ideations, intentions or plans. Patient has no evidence of auditory or visual hallucinations, delusions, and paranoia. Patient has fair to poor insight judgment and impulse control.  Past Medical History  Diagnosis Date  . Diabetes mellitus   . Hypertension   . Seizures   . Pancreatitis   . Alcohol abuse   . Chronic abdominal pain   . Alcoholism /alcohol abuse   . Hepatitis     Past Surgical History  Procedure Laterality Date  . Appendectomy      Family History  Problem Relation Age of Onset  . Hypertension      Social History:  reports that he quit smoking about 27 years ago. His smoking use included Cigarettes. He smoked 0.00 packs per day. He has never used smokeless tobacco. He reports that  drinks alcohol. He reports that he uses illicit drugs (Marijuana).  Allergies:  Allergies  Allergen Reactions  . Morphine And Related Itching    Medications: I have reviewed the patient's current medications.  Results for orders placed during the hospital encounter of 05/28/12 (from the past 48 hour(s))  CBC WITH DIFFERENTIAL     Status: Abnormal   Collection Time    05/28/12  6:41 PM      Result Value Range   WBC 4.4  4.0 - 10.5 K/uL   RBC  4.39  4.22 - 5.81 MIL/uL   Hemoglobin 13.8  13.0 - 17.0 g/dL   HCT 16.1 (*) 09.6 - 04.5 %   MCV 83.6  78.0 - 100.0 fL   MCH 31.4  26.0 - 34.0 pg   MCHC 37.6 (*) 30.0 - 36.0 g/dL   Comment: RULED OUT INTERFERING SUBSTANCES   RDW 11.7  11.5 - 15.5 %   Platelets 203  150 - 400 K/uL   Neutrophils Relative 72  43 - 77 %   Neutro Abs 3.2  1.7 - 7.7 K/uL   Lymphocytes Relative 22  12 - 46 %   Lymphs Abs 1.0  0.7 - 4.0 K/uL   Monocytes Relative 5  3 - 12 %    Monocytes Absolute 0.2  0.1 - 1.0 K/uL   Eosinophils Relative 0  0 - 5 %   Eosinophils Absolute 0.0  0.0 - 0.7 K/uL   Basophils Relative 1  0 - 1 %   Basophils Absolute 0.0  0.0 - 0.1 K/uL  COMPREHENSIVE METABOLIC PANEL     Status: Abnormal   Collection Time    05/28/12  6:41 PM      Result Value Range   Sodium 128 (*) 135 - 145 mEq/L   Potassium 4.4  3.5 - 5.1 mEq/L   Chloride 86 (*) 96 - 112 mEq/L   CO2 18 (*) 19 - 32 mEq/L   Glucose, Bld 559 (*) 70 - 99 mg/dL   Comment: CRITICAL RESULT CALLED TO, READ BACK BY AND VERIFIED WITH:     Hervey Ard 1931 05/28/12 D BRADLEY   BUN 11  6 - 23 mg/dL   Creatinine, Ser 4.09  0.50 - 1.35 mg/dL   Calcium 9.5  8.4 - 81.1 mg/dL   Total Protein 7.2  6.0 - 8.3 g/dL   Albumin 3.8  3.5 - 5.2 g/dL   AST 18  0 - 37 U/L   ALT 26  0 - 53 U/L   Alkaline Phosphatase 82  39 - 117 U/L   Total Bilirubin 0.5  0.3 - 1.2 mg/dL   GFR calc non Af Amer >90  >90 mL/min   GFR calc Af Amer >90  >90 mL/min   Comment:            The eGFR has been calculated     using the CKD EPI equation.     This calculation has not been     validated in all clinical     situations.     eGFR's persistently     <90 mL/min signify     possible Chronic Kidney Disease.  LIPASE, BLOOD     Status: Abnormal   Collection Time    05/28/12  6:41 PM      Result Value Range   Lipase 83 (*) 11 - 59 U/L  TROPONIN I     Status: None   Collection Time    05/28/12  7:11 PM      Result Value Range   Troponin I <0.30  <0.30 ng/mL   Comment:            Due to the release kinetics of cTnI,     a negative result within the first hours     of the onset of symptoms does not rule out     myocardial infarction with certainty.     If myocardial infarction is still suspected,     repeat the test at appropriate intervals.  PROTIME-INR  Status: None   Collection Time    05/28/12  7:11 PM      Result Value Range   Prothrombin Time 12.5  11.6 - 15.2 seconds   INR 0.94  0.00 - 1.49  AMYLASE      Status: None   Collection Time    05/28/12  7:11 PM      Result Value Range   Amylase 81  0 - 105 U/L  ETHANOL     Status: None   Collection Time    05/28/12  7:11 PM      Result Value Range   Alcohol, Ethyl (B) <11  0 - 11 mg/dL   Comment:            LOWEST DETECTABLE LIMIT FOR     SERUM ALCOHOL IS 11 mg/dL     FOR MEDICAL PURPOSES ONLY  URINALYSIS, ROUTINE W REFLEX MICROSCOPIC     Status: Abnormal   Collection Time    05/28/12  7:48 PM      Result Value Range   Color, Urine YELLOW  YELLOW   APPearance CLEAR  CLEAR   Specific Gravity, Urine 1.039 (*) 1.005 - 1.030   pH 5.5  5.0 - 8.0   Glucose, UA >1000 (*) NEGATIVE mg/dL   Hgb urine dipstick NEGATIVE  NEGATIVE   Bilirubin Urine NEGATIVE  NEGATIVE   Ketones, ur >80 (*) NEGATIVE mg/dL   Protein, ur NEGATIVE  NEGATIVE mg/dL   Urobilinogen, UA 0.2  0.0 - 1.0 mg/dL   Nitrite NEGATIVE  NEGATIVE   Leukocytes, UA NEGATIVE  NEGATIVE  URINE RAPID DRUG SCREEN (HOSP PERFORMED)     Status: Abnormal   Collection Time    05/28/12  7:48 PM      Result Value Range   Opiates NONE DETECTED  NONE DETECTED   Cocaine POSITIVE (*) NONE DETECTED   Benzodiazepines NONE DETECTED  NONE DETECTED   Amphetamines NONE DETECTED  NONE DETECTED   Tetrahydrocannabinol NONE DETECTED  NONE DETECTED   Barbiturates NONE DETECTED  NONE DETECTED   Comment:            DRUG SCREEN FOR MEDICAL PURPOSES     ONLY.  IF CONFIRMATION IS NEEDED     FOR ANY PURPOSE, NOTIFY LAB     WITHIN 5 DAYS.                LOWEST DETECTABLE LIMITS     FOR URINE DRUG SCREEN     Drug Class       Cutoff (ng/mL)     Amphetamine      1000     Barbiturate      200     Benzodiazepine   200     Tricyclics       300     Opiates          300     Cocaine          300     THC              50  URINE MICROSCOPIC-ADD ON     Status: None   Collection Time    05/28/12  7:48 PM      Result Value Range   Squamous Epithelial / LPF RARE  RARE  KETONES, QUALITATIVE     Status: Abnormal    Collection Time    05/28/12  9:28 PM      Result Value Range   Acetone, Bld LARGE (*)  NEGATIVE  GLUCOSE, CAPILLARY     Status: Abnormal   Collection Time    05/28/12 11:51 PM      Result Value Range   Glucose-Capillary 326 (*) 70 - 99 mg/dL  MRSA PCR SCREENING     Status: None   Collection Time    05/29/12 12:15 AM      Result Value Range   MRSA by PCR NEGATIVE  NEGATIVE   Comment:            The GeneXpert MRSA Assay (FDA     approved for NASAL specimens     only), is one component of a     comprehensive MRSA colonization     surveillance program. It is not     intended to diagnose MRSA     infection nor to guide or     monitor treatment for     MRSA infections.  TROPONIN I     Status: None   Collection Time    05/29/12  1:00 AM      Result Value Range   Troponin I <0.30  <0.30 ng/mL   Comment:            Due to the release kinetics of cTnI,     a negative result within the first hours     of the onset of symptoms does not rule out     myocardial infarction with certainty.     If myocardial infarction is still suspected,     repeat the test at appropriate intervals.  BASIC METABOLIC PANEL     Status: Abnormal   Collection Time    05/29/12  1:00 AM      Result Value Range   Sodium 132 (*) 135 - 145 mEq/L   Potassium 3.4 (*) 3.5 - 5.1 mEq/L   Comment: DELTA CHECK NOTED   Chloride 96  96 - 112 mEq/L   CO2 18 (*) 19 - 32 mEq/L   Glucose, Bld 310 (*) 70 - 99 mg/dL   BUN 9  6 - 23 mg/dL   Creatinine, Ser 2.44  0.50 - 1.35 mg/dL   Calcium 8.7  8.4 - 01.0 mg/dL   GFR calc non Af Amer >90  >90 mL/min   GFR calc Af Amer >90  >90 mL/min   Comment:            The eGFR has been calculated     using the CKD EPI equation.     This calculation has not been     validated in all clinical     situations.     eGFR's persistently     <90 mL/min signify     possible Chronic Kidney Disease.  HEMOGLOBIN A1C     Status: Abnormal   Collection Time    05/29/12  1:00 AM       Result Value Range   Hemoglobin A1C 13.4 (*) <5.7 %   Comment: (NOTE)                                                                               According to the ADA Clinical Practice Recommendations for 2011, when     HbA1c  is used as a screening test:      >=6.5%   Diagnostic of Diabetes Mellitus               (if abnormal result is confirmed)     5.7-6.4%   Increased risk of developing Diabetes Mellitus     References:Diagnosis and Classification of Diabetes Mellitus,Diabetes     Care,2011,34(Suppl 1):S62-S69 and Standards of Medical Care in             Diabetes - 2011,Diabetes Care,2011,34 (Suppl 1):S11-S61.   Mean Plasma Glucose 338 (*) <117 mg/dL  GLUCOSE, CAPILLARY     Status: Abnormal   Collection Time    05/29/12  1:03 AM      Result Value Range   Glucose-Capillary 295 (*) 70 - 99 mg/dL  GLUCOSE, CAPILLARY     Status: Abnormal   Collection Time    05/29/12  2:01 AM      Result Value Range   Glucose-Capillary 259 (*) 70 - 99 mg/dL  BASIC METABOLIC PANEL     Status: Abnormal   Collection Time    05/29/12  3:00 AM      Result Value Range   Sodium 133 (*) 135 - 145 mEq/L   Potassium 3.4 (*) 3.5 - 5.1 mEq/L   Chloride 99  96 - 112 mEq/L   CO2 22  19 - 32 mEq/L   Glucose, Bld 200 (*) 70 - 99 mg/dL   BUN 8  6 - 23 mg/dL   Creatinine, Ser 4.09  0.50 - 1.35 mg/dL   Calcium 8.8  8.4 - 81.1 mg/dL   GFR calc non Af Amer >90  >90 mL/min   GFR calc Af Amer >90  >90 mL/min   Comment:            The eGFR has been calculated     using the CKD EPI equation.     This calculation has not been     validated in all clinical     situations.     eGFR's persistently     <90 mL/min signify     possible Chronic Kidney Disease.  GLUCOSE, CAPILLARY     Status: Abnormal   Collection Time    05/29/12  4:00 AM      Result Value Range   Glucose-Capillary 164 (*) 70 - 99 mg/dL  BASIC METABOLIC PANEL     Status: Abnormal   Collection Time    05/29/12  4:51 AM      Result Value Range    Sodium 133 (*) 135 - 145 mEq/L   Potassium 3.5  3.5 - 5.1 mEq/L   Chloride 100  96 - 112 mEq/L   CO2 23  19 - 32 mEq/L   Glucose, Bld 241 (*) 70 - 99 mg/dL   BUN 9  6 - 23 mg/dL   Creatinine, Ser 9.14  0.50 - 1.35 mg/dL   Calcium 8.7  8.4 - 78.2 mg/dL   GFR calc non Af Amer >90  >90 mL/min   GFR calc Af Amer >90  >90 mL/min   Comment:            The eGFR has been calculated     using the CKD EPI equation.     This calculation has not been     validated in all clinical     situations.     eGFR's persistently     <90 mL/min signify     possible Chronic Kidney  Disease.  LIPID PANEL     Status: Abnormal   Collection Time    05/29/12  4:51 AM      Result Value Range   Cholesterol 318 (*) 0 - 200 mg/dL   Triglycerides 161 (*) <150 mg/dL   HDL 47  >09 mg/dL   Total CHOL/HDL Ratio 6.8     VLDL 33  0 - 40 mg/dL   LDL Cholesterol 604 (*) 0 - 99 mg/dL   Comment:            Total Cholesterol/HDL:CHD Risk     Coronary Heart Disease Risk Table                         Men   Women      1/2 Average Risk   3.4   3.3      Average Risk       5.0   4.4      2 X Average Risk   9.6   7.1      3 X Average Risk  23.4   11.0                Use the calculated Patient Ratio     above and the CHD Risk Table     to determine the patient's CHD Risk.                ATP III CLASSIFICATION (LDL):      <100     mg/dL   Optimal      540-981  mg/dL   Near or Above                        Optimal      130-159  mg/dL   Borderline      191-478  mg/dL   High      >295     mg/dL   Very High  GLUCOSE, CAPILLARY     Status: Abnormal   Collection Time    05/29/12  5:04 AM      Result Value Range   Glucose-Capillary 200 (*) 70 - 99 mg/dL  GLUCOSE, CAPILLARY     Status: Abnormal   Collection Time    05/29/12  6:07 AM      Result Value Range   Glucose-Capillary 163 (*) 70 - 99 mg/dL  TROPONIN I     Status: None   Collection Time    05/29/12  6:50 AM      Result Value Range   Troponin I <0.30  <0.30  ng/mL   Comment:            Due to the release kinetics of cTnI,     a negative result within the first hours     of the onset of symptoms does not rule out     myocardial infarction with certainty.     If myocardial infarction is still suspected,     repeat the test at appropriate intervals.  GLUCOSE, CAPILLARY     Status: Abnormal   Collection Time    05/29/12  6:58 AM      Result Value Range   Glucose-Capillary 136 (*) 70 - 99 mg/dL  GLUCOSE, CAPILLARY     Status: Abnormal   Collection Time    05/29/12  8:04 AM      Result Value Range   Glucose-Capillary 124 (*) 70 - 99 mg/dL  Dg Abd 1 View  05/29/2012  *RADIOLOGY REPORT*  Clinical Data: Abdominal pain and vomiting  ABDOMEN - 1 VIEW  Comparison: 04/13/2012 CT  Findings: Hemidiaphragms excluded from the image. The bowel gas pattern is non-obstructive. Organ outlines are normal where seen. No acute or aggressive osseous abnormality identified.  IMPRESSION: Nonobstructive bowel gas pattern.   Original Report Authenticated By: Jearld Lesch, M.D.    Ct Head Wo Contrast  05/28/2012  *RADIOLOGY REPORT*  Clinical Data: Severe headache  CT HEAD WITHOUT CONTRAST  Technique:  Contiguous axial images were obtained from the base of the skull through the vertex without contrast.  Comparison: None.  Findings: Mild atrophy for age.  No acute infarct.  Negative for intracranial hemorrhage or mass.  Soft tissue swelling of the scalp right parietal region.  This could represent a scalp hematoma however no history of trauma is given.  This could also represent a benign or malignant skin mass lesion.  No skull abnormality is identified.  IMPRESSION: Mild atrophy.  No acute intracranial abnormality.  Skin thickening right parietal scalp.  Recommend evaluation for injury or hematoma in the area.   Original Report Authenticated By: Janeece Riggers, M.D.    Mri Brain Without Contrast  05/29/2012  *RADIOLOGY REPORT*  Clinical Data: Right leg weakness  MRI HEAD  WITHOUT CONTRAST  Technique:  Multiplanar, multiecho pulse sequences of the brain and surrounding structures were obtained according to standard protocol without intravenous contrast.  Comparison: CT 05/28/2012  Findings: Negative for acute infarct.  Chronic infarct right globus pallidus.  Cerebral white matter is negative.  Brainstem is negative.  Small areas of chronic ischemia in the cerebellar white matter bilaterally.  Negative for intracranial hemorrhage or fluid collection. Ventricle size is normal.  No shift of the midline structures.  Paranasal sinuses are clear.  Soft tissue mass right parietal scalp.  This is unchanged from the CT.  This could represent resolving hematoma or a neoplastic process.  Physical examination directed to this area is suggested.  IMPRESSION: No acute intracranial abnormality.  Right parietal scalp mass, question hematoma versus tumor.   Original Report Authenticated By: Janeece Riggers, M.D.     Positive for anxiety, bad mood, depression, illegal drug usage, sleep disturbance and Loss of his mother and severe psychosocial social stressors including substance abuse Blood pressure 117/90, pulse 82, temperature 98.5 F (36.9 C), temperature source Oral, resp. rate 15, height 5\' 11"  (1.803 m), weight 158 lb 11.7 oz (72 kg), SpO2 97.00%.   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 Patient will be referred to the substance abuse rehabilitation services who and he was completely detoxed and medically clear. Will ask the social service to follow up with him to obtain expanded data base.  Graham Hyun,JANARDHAHA R. 05/29/2012, 12:20 PM

## 2012-05-29 NOTE — Progress Notes (Signed)
Gabriel Rush is a 52 y.o. male patient who transferred  From 2900 awake, alert  & orientated  X 3, Full Code, VSS - Blood pressure 122/85, pulse 73, temperature 97.6 F (36.4 C), temperature source Oral, resp. rate 20, height 5\' 11"  (1.803 m), weight 72 kg (158 lb 11.7 oz), SpO2 100.00%., , no c/o shortness of breath, no c/o chest pain, no distress noted.   IV site WDL: wrist left, LAC condition patent and no redness with a transparent dsg that's clean dry and intact.  Allergies:   Allergies  Allergen Reactions  . Morphine And Related Itching     Past Medical History  Diagnosis Date  . Diabetes mellitus   . Hypertension   . Seizures   . Pancreatitis   . Alcohol abuse   . Chronic abdominal pain   . Alcoholism /alcohol abuse   . Hepatitis     Pt orientation to unit, room and routine. SR up x 2, fall risk assessment complete with Patient and family verbalizing understanding of risks associated with falls. Pt verbalizes an understanding of how to use the call bell and to call for help before getting out of bed.  Skin, clean-dry- intact without evidence of bruising, or skin tears.   No evidence of skin break down noted on exam.     Will cont to monitor and assist as needed.  Cindra Eves, RN 05/29/2012 8:14 PM

## 2012-05-29 NOTE — Progress Notes (Addendum)
  I have directly reviewed the clinical findings, lab, imaging studies and management of this patient in detail. I have interviewed and examined the patient and agree with the documentation,  as recorded by the Physician extender.  Added Librium scheduled to CIWA, check L spine X ray for R.Foot drop, await MRI, PT-OT, ASA.  Leroy Sea M.D on 05/29/2012 at 12:54 PM  [facility] Office  [phone]

## 2012-05-29 NOTE — Progress Notes (Signed)
INITIAL NUTRITION ASSESSMENT  DOCUMENTATION CODES Per approved criteria  -Severe  malnutrition in the context of social or environmental circumstances   INTERVENTION: 1. Add Glucerna Shake po TID, each supplement provides 220 kcal and 10 grams of protein, once diet has been advanced past clear liquids  2. Continue multivitamin   NUTRITION DIAGNOSIS: Altered nutrient utilization related to lack of DM medications as evidenced by weight loss.   Goal: PO intake to meet >/=90% estimated nutrition needs  Monitor:  PO intake, diet advance, weight trends, labs   Reason for Assessment: Malnutrition Screening Tool  52 y.o. male  Admitting Dx: DKA, abd pain   ASSESSMENT: Pt admitted with abdominal pain, with hx of Etoh and cocaine abuse. Hx of DM, was not taking medications for some time, in DKA on admission.  Started on clear liquids per RN. Likely pancreatitis.  Weight hx shows 47 lbs (23% body weight) weight loss in the past 7 months, severe weight loss.  Pt reports decreased oral intake, only usually able to eat one meal daily. In addition pt states he has been with out medications on and off for several months. Elevated blood sugars likely contributing to weight loss.   Pt meets criteria for severe malnutrition in the context of social/environmental circumstances 2/2 weight loss of 23% body weight in 7 months, meeting <50% estimated nutrition need for > 1 month.   Height: Ht Readings from Last 1 Encounters:  05/29/12 5\' 11"  (1.803 m)    Weight: Wt Readings from Last 1 Encounters:  05/29/12 158 lb 11.7 oz (72 kg)    Ideal Body Weight: 172 lbs  % Ideal Body Weight: 92%  Wt Readings from Last 10 Encounters:  05/29/12 158 lb 11.7 oz (72 kg)  10/23/11 205 lb 7.5 oz (93.2 kg)  06/04/11 218 lb 7.6 oz (99.1 kg)  05/20/11 219 lb (99.338 kg)  03/16/11 211 lb 3.2 oz (95.8 kg)  02/09/11 210 lb (95.255 kg)  01/16/11 225 lb (102.059 kg)  12/22/10 219 lb (99.338 kg)  12/29/09 215 lb  (97.523 kg)  08/12/09 217 lb (98.431 kg)    Usual Body Weight: 219 lbs   % Usual Body Weight: 72%  BMI:  Body mass index is 22.15 kg/(m^2). WNL   Estimated Nutritional Needs: Kcal: 2000-2200 Protein: 65-75 gm  Fluid: 2-2.2 L   Skin: intact   Diet Order: Clear Liquid  EDUCATION NEEDS: -No education needs identified at this time   Intake/Output Summary (Last 24 hours) at 05/29/12 0950 Last data filed at 05/29/12 0800  Gross per 24 hour  Intake 1990.19 ml  Output   1600 ml  Net 390.19 ml    Last BM: PTA   Labs:   Recent Labs Lab 05/29/12 0100 05/29/12 0300 05/29/12 0451  NA 132* 133* 133*  K 3.4* 3.4* 3.5  CL 96 99 100  CO2 18* 22 23  BUN 9 8 9   CREATININE 0.60 0.60 0.60  CALCIUM 8.7 8.8 8.7  GLUCOSE 310* 200* 241*    CBG (last 3)   Recent Labs  05/29/12 0607 05/29/12 0658 05/29/12 0804  GLUCAP 163* 136* 124*    Scheduled Meds: . amLODipine  5 mg Oral Daily  . aspirin  325 mg Oral Daily  . folic acid  1 mg Oral Daily  . gabapentin  300 mg Oral TID  . insulin aspart  0-15 Units Subcutaneous TID WC  . insulin aspart  0-5 Units Subcutaneous QHS  . insulin aspart  4 Units Subcutaneous  TID WC  . insulin glargine  30 Units Subcutaneous Daily  . LORazepam  0-4 mg Intravenous Q6H   Followed by  . [START ON 05/31/2012] LORazepam  0-4 mg Intravenous Q12H  . multivitamin with minerals  1 tablet Oral Daily  . phenytoin  200 mg Oral QHS  . thiamine  100 mg Oral Daily   Or  . thiamine  100 mg Intravenous Daily    Continuous Infusions: . sodium chloride Stopped (05/29/12 0402)  . sodium chloride 10 mL/hr at 05/29/12 0200  . sodium chloride    . insulin (NOVOLIN-R) infusion 2.3 Units/hr (05/29/12 0900)    Past Medical History  Diagnosis Date  . Diabetes mellitus   . Hypertension   . Seizures   . Pancreatitis   . Alcohol abuse   . Chronic abdominal pain   . Alcoholism /alcohol abuse   . Hepatitis     Past Surgical History  Procedure  Laterality Date  . Appendectomy      Clarene Duke RD, LDN Pager [phone] After Hours pager [phone]

## 2012-05-30 DIAGNOSIS — F1410 Cocaine abuse, uncomplicated: Secondary | ICD-10-CM

## 2012-05-30 LAB — CBC
HCT: 31.6 % — ABNORMAL LOW (ref 39.0–52.0)
MCV: 82.7 fL (ref 78.0–100.0)
Platelets: 173 10*3/uL (ref 150–400)
RBC: 3.82 MIL/uL — ABNORMAL LOW (ref 4.22–5.81)
WBC: 3.9 10*3/uL — ABNORMAL LOW (ref 4.0–10.5)

## 2012-05-30 LAB — GLUCOSE, CAPILLARY

## 2012-05-30 LAB — BASIC METABOLIC PANEL
CO2: 25 mEq/L (ref 19–32)
Chloride: 102 mEq/L (ref 96–112)
Sodium: 135 mEq/L (ref 135–145)

## 2012-05-30 MED ORDER — HYDROMORPHONE HCL PF 1 MG/ML IJ SOLN
0.5000 mg | INTRAMUSCULAR | Status: DC | PRN
  Administered 2012-05-30 – 2012-05-31 (×5): 0.5 mg via INTRAVENOUS
  Filled 2012-05-30 (×6): qty 1

## 2012-05-30 MED ORDER — INSULIN GLARGINE 100 UNIT/ML ~~LOC~~ SOLN
34.0000 [IU] | Freq: Every day | SUBCUTANEOUS | Status: DC
  Administered 2012-05-30 – 2012-05-31 (×2): 34 [IU] via SUBCUTANEOUS
  Filled 2012-05-30 (×2): qty 0.34

## 2012-05-30 MED ORDER — PANCRELIPASE (LIP-PROT-AMYL) 12000-38000 UNITS PO CPEP
1.0000 | ORAL_CAPSULE | Freq: Three times a day (TID) | ORAL | Status: DC
  Administered 2012-05-30 – 2012-05-31 (×4): 1 via ORAL
  Filled 2012-05-30 (×6): qty 1

## 2012-05-30 MED ORDER — SENNA 8.6 MG PO TABS
1.0000 | ORAL_TABLET | Freq: Every day | ORAL | Status: DC
  Administered 2012-05-30 – 2012-05-31 (×2): 8.6 mg via ORAL
  Filled 2012-05-30 (×2): qty 1

## 2012-05-30 MED ORDER — POTASSIUM CHLORIDE CRYS ER 20 MEQ PO TBCR
40.0000 meq | EXTENDED_RELEASE_TABLET | Freq: Once | ORAL | Status: AC
  Administered 2012-05-30: 40 meq via ORAL
  Filled 2012-05-30: qty 2

## 2012-05-30 MED ORDER — ENOXAPARIN SODIUM 40 MG/0.4ML ~~LOC~~ SOLN
40.0000 mg | SUBCUTANEOUS | Status: DC
  Administered 2012-05-30 – 2012-05-31 (×2): 40 mg via SUBCUTANEOUS
  Filled 2012-05-30 (×2): qty 0.4

## 2012-05-30 NOTE — Progress Notes (Signed)
MEDICATION RELATED CONSULT NOTE - INITIAL   Pharmacy Consult for Phenytoin Indication: hx seizure  Allergies  Allergen Reactions  . Morphine And Related Itching    Patient Measurements: Height: 5\' 11"  (180.3 cm) Weight: 158 lb 11.7 oz (72 kg) IBW/kg (Calculated) : 75.3   Vital Signs: Temp: 97.6 F (36.4 C) (04/23 0430) Temp src: Oral (04/23 0430) BP: 108/76 mmHg (04/23 1019) Pulse Rate: 65 (04/23 0430) Intake/Output from previous day: 04/22 0701 - 04/23 0700 In: 2538.9 [P.O.:1800; I.V.:738.9] Out: 1625 [Urine:1625] Intake/Output from this shift: Total I/O In: 360 [P.O.:360] Out: 1100 [Urine:1100]  Labs:  Recent Labs  05/28/12 1841  05/29/12 0300 05/29/12 0451 05/30/12 0510  WBC 4.4  --   --   --  3.9*  HGB 13.8  --   --   --  11.7*  HCT 36.7*  --   --   --  31.6*  PLT 203  --   --   --  173  CREATININE 0.84  < > 0.60 0.60 0.53  ALBUMIN 3.8  --   --   --   --   PROT 7.2  --   --   --   --   AST 18  --   --   --   --   ALT 26  --   --   --   --   ALKPHOS 82  --   --   --   --   BILITOT 0.5  --   --   --   --   < > = values in this interval not displayed. Estimated Creatinine Clearance: 112.5 ml/min (by C-G formula based on Cr of 0.53).   Microbiology: Recent Results (from the past 720 hour(s))  MRSA PCR SCREENING     Status: None   Collection Time    05/29/12 12:15 AM      Result Value Range Status   MRSA by PCR NEGATIVE  NEGATIVE Final   Comment:            The GeneXpert MRSA Assay (FDA     approved for NASAL specimens     only), is one component of a     comprehensive MRSA colonization     surveillance program. It is not     intended to diagnose MRSA     infection nor to guide or     monitor treatment for     MRSA infections.    Medical History: Past Medical History  Diagnosis Date  . Diabetes mellitus   . Hypertension   . Seizures   . Pancreatitis   . Alcohol abuse   . Chronic abdominal pain   . Alcoholism /alcohol abuse   .  Hepatitis    Assessment: Patient is a 52 y.o M with hx of polysubstance abuse, seizures, and noncompliance with his medications.  He was admitted yesterday for DKA.  Patient stated that he was on Dilantin for seizures and the last time he picked up his prescription for this at [facility] was about 2 years ago.  The last dilantin dose he took (200mg  daily) was 3 months ago and has not had any breakthrough seizures during this time.  Goal of Therapy:  Dilantin level 10-20  Plan:  1) continue with Dilantin 200mg  daily for now 2) check level in 5-7 days  Servando Kyllonen P 05/30/2012,11:11 AM

## 2012-05-30 NOTE — Progress Notes (Signed)
[facility] PROGRESS NOTE  Darran Gabay ZOX:[id number] DOB: 01/17/1961 DOA: 05/28/2012 PCP: No PCP Per Patient  52 year old male patient with repeated admissions for DKA. He has known diabetes mellitus hypertension and chronic pancreatitis. He also has polysubstance abuse primarily consisting of alcohol and cocaine. He presented once again to the emergency department because of nausea with vomiting and abdominal pain. His pain is mainly epigastric radiating to the back and some episodes of diarrhea. No other constitutional symptoms or hematemesis. He also noticed increased weakness in the right leg and has been unable to dorsiflex the foot for one week. No apparent traumatic injury to same foot. No other focal neurological deficits reported. CT of the head in the emergency department was negative for any acute injury. At presentation his serum glucose was 559 and was mildly acidotic with a CO2 of 18 therefore he was started on an insulin infusion.  Since admission we have spoken to the patient and he has been unable to maintain any prescribed followup physician visits and has been unable to obtain his medications per his report. This was confirmed by review of the medication reconciliation which demonstrated that patient is not taking any medications. He was last discharged in September 2013 with a long list of medications including medications for peripheral neuropathy, diabetes and treatment of seizures. He was to follow up with the [facility] but apparently has not done so. It is also reported that the patient is homeless. Per nursing report this patient previously had been cared for by his mother but his mother had recently died and her concerns patient may not have the capacity to care for himself in outpatient setting.  Assessment/Plan:  DKA (diabetic ketoacidoses)/insulin dependent diabetes  -HgbA1c is 13.4 on 05/29/12 -Patient on  Lantus 34 units and Novolog SS  Moderate -Consult diabetes coordinator for education and recommendations.  Chronic pancreatitis/ PSEUDOCYST, PANCREAS -abdominal pain controlled. Decreased dilaudid.  Eating solid food. -resume Creon   Right foot drop/known peripheral neuropathy  -MRI brain negative for acute changes.   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  Rehab at discharge.  Psych Social work engaged.  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   Consultants: Psychiatry   HPI/Subjective: Patient requesting solid food.  Objective: Filed Vitals:   05/29/12 2119 05/30/12 0016 05/30/12 0430 05/30/12 1019  BP: 105/80 111/76 95/70 108/76  Pulse: 80 81 65   Temp: 97.5 F (36.4 C) 97.7 F (36.5 C) 97.6 F (36.4 C)   TempSrc: Oral Oral Oral   Resp: 20 22 22    Height:      Weight:      SpO2: 93% 98% 100%     Intake/Output Summary (Last 24 hours) at 05/30/12 1136 Last data filed at 05/30/12 0909  Gross per 24 hour  Intake   2040  ml  Output   2725 ml  Net   -685 ml   Filed Weights   05/29/12 0015  Weight: 72 kg (158 lb 11.7 oz)    Exam:   General:  A&O, NAD, Lying comfortably in bed. pleasant  Cardiovascular: RRR, no murmurs, rubs or  gallops, no lower extremity edema  Respiratory: CTA, no wheeze, crackles, or rales.  No increased work of breathing.  Abdomen: Soft, non-tender, non-distended, + bowel sounds, no masses  Musculoskeletal: Able to move all 4 extremities, 5/5 strength in each-but with Right foot drop  Data Reviewed: Basic Metabolic Panel:  Recent Labs Lab 05/28/12 1841 05/29/12 0100 05/29/12 0300 05/29/12 0451 05/30/12 0510  NA 128* 132* 133* 133* 135  K 4.4 3.4* 3.4* 3.5 3.3*  CL 86* 96 99 100 102  CO2 18* 18* 22 23 25   GLUCOSE 559* 310* 200* 241* 229*  BUN 11 9 8 9 6   CREATININE 0.84 0.60 0.60 0.60 0.53  CALCIUM 9.5 8.7 8.8 8.7 8.8   Liver Function Tests:  Recent Labs Lab 05/28/12 1841  AST 18  ALT 26  ALKPHOS 82  BILITOT 0.5  PROT 7.2  ALBUMIN 3.8    Recent Labs Lab 05/28/12 1841 05/28/12 1911 05/30/12 0510  LIPASE 83*  --  14  AMYLASE  --  81  --    CBC:  Recent Labs Lab 05/28/12 1841 05/30/12 0510  WBC 4.4 3.9*  NEUTROABS 3.2  --   HGB 13.8 11.7*  HCT 36.7* 31.6*  MCV 83.6 82.7  PLT 203 173   Cardiac Enzymes:  Recent Labs Lab 05/28/12 1911 05/29/12 0100 05/29/12 0650 05/29/12 1430  TROPONINI <0.30 <0.30 <0.30 <0.30   CBG:  Recent Labs Lab 05/29/12 1134 05/29/12 1519 05/29/12 1630 05/29/12 2119 05/30/12 0801  GLUCAP 285* 261* 306* 198* 247*    Recent Results (from the past 240 hour(s))  MRSA PCR SCREENING     Status: None   Collection Time    05/29/12 12:15 AM      Result Value Range Status   MRSA by PCR NEGATIVE  NEGATIVE Final   Comment:            The GeneXpert MRSA Assay (FDA     approved for NASAL specimens     only), is one component of a     comprehensive MRSA colonization     surveillance program. It is not     intended to diagnose MRSA     infection nor to guide or     monitor treatment for     MRSA infections.     Studies: Dg Lumbar Spine 2-3 Views  05/29/2012  *RADIOLOGY REPORT*  Clinical Data: Right foot drop.  Pain.   No injury.  LUMBAR SPINE - 2-3 VIEW  Comparison: Plain film abdomen 05/28/2012 and CT of 04/13/2012  Findings: Five lumbar type vertebral bodies.  Sacroiliac joints are symmetric.  Maintenance of vertebral body height and alignment. Intervertebral disc heights are maintained.  IMPRESSION: No acute osseous abnormality.   Original Report Authenticated By: Jeronimo Greaves, M.D.    Dg Abd 1 View  05/29/2012  *RADIOLOGY REPORT*  Clinical Data: Abdominal pain and vomiting  ABDOMEN - 1 VIEW  Comparison: 04/13/2012 CT  Findings: Hemidiaphragms excluded from the image. The bowel gas pattern is non-obstructive. Organ outlines are normal where seen. No acute or aggressive osseous abnormality identified.  IMPRESSION: Nonobstructive bowel gas pattern.   Original Report Authenticated By: Jearld Lesch, M.D.  Ct Head Wo Contrast  05/28/2012  *RADIOLOGY REPORT*  Clinical Data: Severe headache  CT HEAD WITHOUT CONTRAST  Technique:  Contiguous axial images were obtained from the base of the skull through the vertex without contrast.  Comparison: None.  Findings: Mild atrophy for age.  No acute infarct.  Negative for intracranial hemorrhage or mass.  Soft tissue swelling of the scalp right parietal region.  This could represent a scalp hematoma however no history of trauma is given.  This could also represent a benign or malignant skin mass lesion.  No skull abnormality is identified.  IMPRESSION: Mild atrophy.  No acute intracranial abnormality.  Skin thickening right parietal scalp.  Recommend evaluation for injury or hematoma in the area.   Original Report Authenticated By: Janeece Riggers, M.D.    Mri Brain Without Contrast  05/29/2012  *RADIOLOGY REPORT*  Clinical Data: Right leg weakness  MRI HEAD WITHOUT CONTRAST  Technique:  Multiplanar, multiecho pulse sequences of the brain and surrounding structures were obtained according to standard protocol without intravenous contrast.  Comparison: CT 05/28/2012  Findings:  Negative for acute infarct.  Chronic infarct right globus pallidus.  Cerebral white matter is negative.  Brainstem is negative.  Small areas of chronic ischemia in the cerebellar white matter bilaterally.  Negative for intracranial hemorrhage or fluid collection. Ventricle size is normal.  No shift of the midline structures.  Paranasal sinuses are clear.  Soft tissue mass right parietal scalp.  This is unchanged from the CT.  This could represent resolving hematoma or a neoplastic process.  Physical examination directed to this area is suggested.  IMPRESSION: No acute intracranial abnormality.  Right parietal scalp mass, question hematoma versus tumor.   Original Report Authenticated By: Janeece Riggers, M.D.     Scheduled Meds: . amLODipine  5 mg Oral Daily  . aspirin  325 mg Oral Daily  . chlordiazePOXIDE  10 mg Oral TID  . enoxaparin (LOVENOX) injection  40 mg Subcutaneous Q24H  . feeding supplement  237 mL Oral TID BM  . folic acid  1 mg Oral Daily  . gabapentin  300 mg Oral TID  . insulin aspart  0-15 Units Subcutaneous TID WC  . insulin aspart  0-5 Units Subcutaneous QHS  . insulin aspart  4 Units Subcutaneous TID WC  . insulin glargine  34 Units Subcutaneous Daily  . LORazepam  0-4 mg Intravenous Q6H   Followed by  . [START ON 05/31/2012] LORazepam  0-4 mg Intravenous Q12H  . mirtazapine  15 mg Oral QHS  . multivitamin with minerals  1 tablet Oral Daily  . phenytoin  200 mg Oral QHS  . potassium chloride  40 mEq Oral Once  . thiamine  100 mg Oral Daily   Or  . thiamine  100 mg Intravenous Daily   Continuous Infusions: . sodium chloride 10 mL/hr at 05/29/12 0200  . sodium chloride 75 mL/hr at 05/30/12 0702    Active Problems:   DIABETES MELLITUS, TYPE II, ON INSULIN   ALCOHOL ABUSE   HYPERTENSION, BENIGN ESSENTIAL   CONSTIPATION   PSEUDOCYST, PANCREAS   SEIZURE DISORDER   Chronic pancreatitis   DKA (diabetic ketoacidoses)   Right foot drop   Cocaine abuse   Noncompliance  with treatment   Unintentional weight loss    Conley Canal  [facility] Pager [phone]. If 7PM-7AM, please contact night-coverage at [email or web address], password [facility] 05/30/2012, 11:36 AM  LOS: 2 days    Attending Patient seen and examined, agree  with the assessment and plan as outlined above. Non compliant to meds/Insulin-home less-with ETOH/Cocaine use. Have counseled extensively-has right foot drop-suspect from DM neuropathy. Have counseled extensively regarding importance of long term sugar control. He understands risk of disabling neuropathy, MI, CVA, CKD, Retinopathy.  S Ghimire

## 2012-05-30 NOTE — [facility] Assessment (Signed)
[facility] Assessment Progress Note      Consulted with Serena Colonel NP re referral for this patient to be admitted to [facility]. Reviewed the consult note done by Dr. Elsie Saas it recommends that patient who is detoxed be admitted to rehab. [facility] doesn't provide rehab services.
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
## 2012-05-30 NOTE — Clinical Social Work Psych Note (Signed)
Psych CSW met with pt at beside.  Full assessment to follow.  Psych CSW provided pt with resources for inpatient ETOH/SA tx, long-term ETOH/SA tx, and outpatient ETOH/SA tx.  Psych CSW created goals and a d/c plan with pt.  Psych CSW shared with pt that there were currently no tx beds available at [facility] or [facility].  Psych CSW will f/u with these two facilities again in the am prior to pt d/c.  If no bed available and d/c time is before noon- pt to d/c to the [facility] on [address] in [place] [facility]- homeless day shelter).  If pt d/c after noon, the pt will d/c to the [facility] on [address] in [place] in attempts to secure a bed.  Pt will then need to f/u with the [facility] the following morning where they will assist pt with showers, laundry, phone service, physical address, job seeking etc...  Pt will need a bus pass prior to d/c.  CSW will continue to assist as necessary.  Vickii Penna, LCSWA [phone]  Clinical Social Work

## 2012-05-30 NOTE — Evaluation (Signed)
Occupational Therapy Evaluation Patient Details Name: Gabriel Rush MRN: [id number] DOB: 1960-05-06 Today's Date: 05/30/2012 Time: 1191-4782 OT Time Calculation (min): 18 min  OT Assessment / Plan / Recommendation Clinical Impression  This 53 yo male admitted with abdominal pain, chronic back pain, left foot drop presents to acute OT with problems below. Will benefit from acute OT without need for followup.    OT Assessment  Patient needs continued OT Services    Follow Up Recommendations   (does need place to live)    Barriers to Discharge  (Homeless)    Equipment Recommendations  None recommended by OT       Frequency  Min 2X/week    Precautions / Restrictions Precautions Precautions: Fall Restrictions Weight Bearing Restrictions: No   Pertinent Vitals/Pain Chronic back pain, left foot drop    ADL  Eating/Feeding: Performed;Independent Where Assessed - Eating/Feeding: Bed level Grooming: Simulated;Min guard Where Assessed - Grooming: Unsupported sitting Upper Body Bathing: Simulated;Min guard Where Assessed - Upper Body Bathing: Unsupported standing Lower Body Bathing: Simulated;Min guard Where Assessed - Lower Body Bathing: Supported standing Upper Body Dressing: Simulated;Set up Where Assessed - Upper Body Dressing: Unsupported sitting Lower Body Dressing: Simulated;Min guard Where Assessed - Lower Body Dressing: Unsupported sit to stand Toilet Transfer: Performed;Minimal assistance Toilet Transfer Method: Sit to stand Toilet Transfer Equipment: Regular height toilet;Grab bars Toileting - Clothing Manipulation and Hygiene: Performed;Minimal assistance (2 gowns) Where Assessed - Toileting Clothing Manipulation and Hygiene: Standing Equipment Used: Gait belt (pushing Iv pole) Transfers/Ambulation Related to ADLs: Min A for all ADL Comments: bends forward while seated in recliner to doff/don socks    OT Diagnosis: Generalized weakness;Acute pain  OT Problem  List: Decreased strength;Impaired balance (sitting and/or standing);Pain OT Treatment Interventions: Self-care/ADL training;Therapeutic activities;Patient/family education;Balance training   OT Goals Acute Rehab OT Goals OT Goal Formulation: With patient Time For Goal Achievement: 06/06/12 Potential to Achieve Goals: Good ADL Goals Pt Will Perform Grooming: with modified independence;Unsupported;Standing at sink ADL Goal: Grooming - Progress: Goal set today Pt Will Perform Upper Body Bathing: with modified independence;Sitting at sink;Standing at sink;Unsupported ADL Goal: Upper Body Bathing - Progress: Goal set today Pt Will Perform Lower Body Bathing: with modified independence;Sitting at sink;Standing at sink;Unsupported ADL Goal: Lower Body Bathing - Progress: Goal set today Pt Will Perform Upper Body Dressing: with modified independence;Unsupported;Sit to stand from chair;Sit to stand from bed ADL Goal: Upper Body Dressing - Progress: Goal set today Pt Will Perform Lower Body Dressing: with modified independence;Sit to stand from chair;Sit to stand from bed;Unsupported ADL Goal: Lower Body Dressing - Progress: Goal set today Pt Will Transfer to Toilet: with modified independence;Ambulation;with DME;Regular height toilet;Grab bars ADL Goal: Toilet Transfer - Progress: Goal set today Pt Will Perform Toileting - Clothing Manipulation: Independently;Standing ADL Goal: Toileting - Clothing Manipulation - Progress: Goal set today Pt Will Perform Toileting - Hygiene: Independently;Sit to stand from 3-in-1/toilet ADL Goal: Toileting - Hygiene - Progress: Goal set today Miscellaneous OT Goals Miscellaneous OT Goal #1: Pt will be independent in and OOB for BADLs OT Goal: Miscellaneous Goal #1 - Progress: Goal set today  Visit Information  Last OT Received On: 05/30/12 Assistance Needed: +1    Subjective Data  Subjective: "I think I have it figured out" He then showed me how he could move  his right ankle v. his left ankle   Prior Functioning     Home Living Lives With:  (Homeless) Available Help at Discharge:  (None) Additional Comments: Was living with  his mom up until 4 months ago when she passed away and has been homeless since. Prior Function Level of Independence: Independent Able to Take Stairs?: Yes Driving: No Vocation: Unemployed Communication Communication:  (slow) Dominant Hand: Right         Vision/Perception Vision - History Baseline Vision: Wears glasses only for reading Patient Visual Report: No change from baseline   Cognition  Cognition Arousal/Alertness: Awake/alert Behavior During Therapy: WFL for tasks assessed/performed Overall Cognitive Status: Within Functional Limits for tasks assessed    Extremity/Trunk Assessment Right Upper Extremity Assessment RUE ROM/Strength/Tone: Within functional levels Left Upper Extremity Assessment LUE ROM/Strength/Tone: Within functional levels     Mobility Bed Mobility Bed Mobility: Supine to Sit;Sitting - Scoot to Edge of Bed Supine to Sit: 6: Modified independent (Device/Increase time);With rails;HOB elevated Sitting - Scoot to Edge of Bed: 6: Modified independent (Device/Increase time);With rail Transfers Transfers: Sit to Stand;Stand to Sit Sit to Stand: 4: Min assist;With upper extremity assist;From bed (initial posterior lean (not enough to lose balance)) Stand to Sit: 4: Min assist;With upper extremity assist;With armrests;To bed           End of Session OT - End of Session Equipment Utilized During Treatment: Gait belt Activity Tolerance: Patient tolerated treatment well Patient left: in chair (with PT in room)    Evette Georges [phone] 05/30/2012, 2:21 PM

## 2012-05-30 NOTE — Clinical Social Work Psych Note (Addendum)
2;13pm- Pt denied by [facility].  Per Fannie Knee at [facility], pt is in need of tx not detox.  [facility] does not provide tx for ETOH.  Psych CSW will provide resources to pt and inform MD that pt will be able to follow up at home once medically cleared.  Pt is being reviewed at [facility] for ETOH detox.  [facility] and [facility] do not have any detox beds available today.  Psych CSW will continue to follow.  Vickii Penna, LCSWA [phone]  Clinical Social Work
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
## 2012-05-31 DIAGNOSIS — Z9119 Patient's noncompliance with other medical treatment and regimen: Secondary | ICD-10-CM

## 2012-05-31 LAB — GLUCOSE, CAPILLARY
Glucose-Capillary: 196 mg/dL — ABNORMAL HIGH (ref 70–99)
Glucose-Capillary: 220 mg/dL — ABNORMAL HIGH (ref 70–99)

## 2012-05-31 LAB — BASIC METABOLIC PANEL
CO2: 25 mEq/L (ref 19–32)
Calcium: 8.7 mg/dL (ref 8.4–10.5)
Chloride: 106 mEq/L (ref 96–112)
Glucose, Bld: 188 mg/dL — ABNORMAL HIGH (ref 70–99)
Sodium: 139 mEq/L (ref 135–145)

## 2012-05-31 MED ORDER — INSULIN GLARGINE 100 UNIT/ML ~~LOC~~ SOLN: 34.0000 [IU] | Freq: Every day | SUBCUTANEOUS | Status: DC

## 2012-05-31 MED ORDER — OXYCODONE HCL 5 MG PO TABS: 5.0000 mg | ORAL_TABLET | Freq: Four times a day (QID) | ORAL | Status: DC | PRN

## 2012-05-31 MED ORDER — SIMVASTATIN 10 MG PO TABS
10.0000 mg | ORAL_TABLET | Freq: Every day | ORAL | Status: DC
  Filled 2012-05-31: qty 1

## 2012-05-31 MED ORDER — SIMVASTATIN 10 MG PO TABS: 10.0000 mg | ORAL_TABLET | Freq: Every day | ORAL | Status: DC

## 2012-05-31 MED ORDER — OXYCODONE HCL 5 MG PO TABS
5.0000 mg | ORAL_TABLET | Freq: Four times a day (QID) | ORAL | Status: DC | PRN
  Administered 2012-05-31: 5 mg via ORAL
  Filled 2012-05-31: qty 1

## 2012-05-31 MED ORDER — AMLODIPINE BESYLATE 5 MG PO TABS: 5.0000 mg | ORAL_TABLET | Freq: Every day | ORAL | Status: DC

## 2012-05-31 MED ORDER — CHLORDIAZEPOXIDE HCL 10 MG PO CAPS: 10.0000 mg | ORAL_CAPSULE | Freq: Three times a day (TID) | ORAL | Status: DC

## 2012-05-31 MED ORDER — PHENYTOIN 50 MG PO CHEW: 100.0000 mg | CHEWABLE_TABLET | Freq: Every day | ORAL | Status: DC

## 2012-05-31 MED ORDER — PANCRELIPASE (LIP-PROT-AMYL) 12000-38000 UNITS PO CPEP: 1.0000 | ORAL_CAPSULE | Freq: Three times a day (TID) | ORAL | Status: DC

## 2012-05-31 MED ORDER — PHENYTOIN SODIUM EXTENDED 100 MG PO CAPS: 200.0000 mg | ORAL_CAPSULE | Freq: Every day | ORAL | Status: DC

## 2012-05-31 MED ORDER — INSULIN ASPART 100 UNIT/ML ~~LOC~~ SOLN
6.0000 [IU] | Freq: Three times a day (TID) | SUBCUTANEOUS | Status: DC
  Administered 2012-05-31 (×2): 6 [IU] via SUBCUTANEOUS

## 2012-05-31 MED ORDER — MIRTAZAPINE 15 MG PO TABS: 15.0000 mg | ORAL_TABLET | Freq: Every day | ORAL | Status: DC

## 2012-05-31 MED ORDER — INSULIN ASPART 100 UNIT/ML ~~LOC~~ SOLN: 6.0000 [IU] | Freq: Three times a day (TID) | SUBCUTANEOUS | Status: DC

## 2012-05-31 MED ORDER — "SYRINGE/NEEDLE (DISP) 30G X 1/2"" 1 ML MISC": 1.0000 | Freq: Three times a day (TID) | Status: DC

## 2012-05-31 MED ORDER — METFORMIN HCL 500 MG PO TABS: 500.0000 mg | ORAL_TABLET | Freq: Two times a day (BID) | ORAL | Status: DC

## 2012-05-31 MED ORDER — GABAPENTIN 300 MG PO CAPS: 300.0000 mg | ORAL_CAPSULE | Freq: Three times a day (TID) | ORAL | Status: DC

## 2012-05-31 NOTE — Progress Notes (Signed)
Occupational Therapy Treatment Patient Details Name: Gabriel Rush MRN: [id number] DOB: 1960-08-06 Today's Date: 05/31/2012 Time: 4098-1191 OT Time Calculation (min): 16 min  OT Assessment / Plan / Recommendation Comments on Treatment Session Pt walking with PT upon arrival.  Once sat in chair pt felt that he could take care of his self care needs and did not need OT.  Once explained and reasoned with pt about doing one task to assess his abilities he was willing to participate.  Pt was able to ambulate and complete task at Independent to Mod I level.  Pt stated he would not be able to take a rw with him  to the shelter but would like a cane.  Communicated with PT regarding the issue.      Follow Up Recommendations  No OT follow up       Equipment Recommendations  None recommended by OT       Frequency Min 2X/week   Plan Discharge plan remains appropriate    Precautions / Restrictions Precautions Precautions: None Restrictions Weight Bearing Restrictions: No   Pertinent Vitals/Pain Pt grimaced and stated he had a headache/ RN made aware and gave meds at end of session     ADL  Grooming: Performed;Teeth care;Modified independent Where Assessed - Grooming: Unsupported standing Toilet Transfer: Simulated;Modified independent Toilet Transfer Method: Sit to Barista:  (recliner>sink>standing EOB) Equipment Used: Gait belt Transfers/Ambulation Related to ADLs: Mod I for transfers and ambulation. Began with rw then stated he didn't need it ADL Comments: Was able to gather his items to brush his teeth and make up his bed with increased time      OT Goals ADL Goals ADL Goal: Grooming - Progress: Met ADL Goal: Toilet Transfer - Progress: Met  Visit Information  Last OT Received On: 05/31/12 Assistance Needed: +1    Subjective Data  Subjective: I can do all my self care tasks(after pt stating he feels OT is not necessary)       Cognition   Cognition Arousal/Alertness: Awake/alert Behavior During Therapy: WFL for tasks assessed/performed Overall Cognitive Status: Within Functional Limits for tasks assessed          Balance Balance Balance Assessed: Yes Dynamic Standing Balance Dynamic Standing - Balance Support: No upper extremity supported;During functional activity Dynamic Standing - Level of Assistance: 7: Independent Dynamic Standing - Balance Activities: Lateral lean/weight shifting;Reaching for objects Dynamic Standing - Comments: Brushing teeth and reaching from washcloth at sink   End of Session OT - End of Session Equipment Utilized During Treatment: Gait belt Activity Tolerance: Patient tolerated treatment well Patient left: with call bell/phone within reach;with nursing in room (standing at EOB) Nurse Communication: Patient requests pain meds       Mayford Knife, [place] 05/31/2012, 11:15 AM
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
## 2012-05-31 NOTE — Progress Notes (Signed)
Physical Therapy Treatment Patient Details Name: Gabriel Rush MRN: [id number] DOB: 12/05/1960 Today's Date: 05/31/2012 Time: 4098-1191,  PT Time Calculation (min): 31 min  PT Assessment / Plan / Recommendation Comments on Treatment Session  pt demonstrates improvement in bed mobility and gait with RW. Pt acknowledged he knows exercise to do at discharge    Follow Up Recommendations  No PT follow up     Does the patient have the potential to tolerate intense rehabilitation     Barriers to Discharge        Equipment Recommendations   (straight cane donated to patient)    Recommendations for Other Services    Frequency     Plan Discharge plan needs to be updated;All goals met and education completed, patient dischaged from PT services    Precautions / Restrictions Precautions Precautions: None Restrictions Weight Bearing Restrictions: No   Pertinent Vitals/Pain Pt c/o back pain    Mobility  Bed Mobility Bed Mobility: Supine to Sit;Sit to Supine Supine to Sit: 6: Modified independent (Device/Increase time);7: Independent;HOB flat Sitting - Scoot to Edge of Bed: 7: Independent Sit to Supine: 7: Independent;HOB flat Transfers Transfers: Sit to Stand;Stand to Sit Sit to Stand: 7: Independent (initial posterior lean (not enough to lose balance)) Stand to Sit: To bed;7: Independent Ambulation/Gait Ambulation/Gait Assistance: 6: Modified independent (Device/Increase time) Ambulation Distance (Feet): 300 Feet (150 x2) Assistive device: Rolling walker Gait Pattern: Step-through pattern;Decreased dorsiflexion - right Gait velocity: decreased General Gait Details: ataxic and erratic gait pattern that is improved with use of RW.  Decreased dorsiflexion on right foot evident in gait, but partial acativiation is present.  Pt states he does not want to use a walker and prefers a cane.  Pt is indigent.  Cane found in deptment donated to patient to use for balance assist.  Pt feels it  will work well for him Stairs: No Corporate treasurer: No    Exercises General Exercises - Upper Extremity Shoulder Flexion: AROM;Both;20 reps;Supine;Standing General Exercises - Lower Extremity Quad Sets: AROM;Both;5 reps;Supine Gluteal Sets: AROM;Both;5 reps;Supine Hip Flexion/Marching: AROM;Both;Left;Right;Supine (encourage stretching of hips and back) Other Exercises Other Exercises: repeated sit to stand x5 with no upper extremity assist  Other Exercises: supine stretching with HOB flat with encouragement to reach with heel... pt able to activate dorsiflexion while doing that   PT Diagnosis:    PT Problem List:   PT Treatment Interventions:     PT Goals Acute Rehab PT Goals PT Goal Formulation: With patient Time For Goal Achievement: 06/13/12 Potential to Achieve Goals: Good Pt will go Sit to Stand: Independently PT Goal: Sit to Stand - Progress: Met Pt will go Stand to Sit: Independently PT Goal: Stand to Sit - Progress: Met Pt will Stand: 3 - 5 min;Independently PT Goal: Stand - Progress: Met Pt will Ambulate: >150 feet;with modified independence;with least restrictive assistive device PT Goal: Ambulate - Progress: Met  Visit Information  Last PT Received On: 05/31/12 Assistance Needed: +1    Subjective Data  Subjective: My back hurts Patient Stated Goal: to get right foot to work better   [employer] Arousal/Alertness: Awake/alert Behavior During Therapy: WFL for tasks assessed/performed Overall Cognitive Status: Difficult to assess Difficult to assess due to:  (question accuracy of information)    Balance  Balance Balance Assessed: Yes Static Sitting Balance Static Sitting - Balance Support: Bilateral upper extremity supported;Feet supported Static Sitting - Level of Assistance: 7: Independent Static Standing Balance Static Standing - Balance Support:  No upper extremity supported Static Standing - Level of Assistance: 4:  Min assist  End of Session PT - End of Session Activity Tolerance: Patient tolerated treatment well Patient left: in chair   GP   Rosey Bath K. Manson Passey, [place] [phone] 05/31/2012, 10:19 AM

## 2012-05-31 NOTE — Clinical Social Work Psych Assess (Signed)
Clinical Social Work Department CLINICAL SOCIAL WORK PSYCHIATRY SERVICE LINE ASSESSMENT 05/31/2012  Patient:  Gabriel Rush  Account:  [id number]  Admit Date:  05/28/2012  Clinical Social Worker:  Read Drivers  Date/Time:  05/31/2012 10:02 AM Referred by:  RN  Date referred:  05/31/2012 Reason for Referral  Behavioral Health Issues   Presenting Symptoms/Problems (In the person's/family's own words):   pt requesting tx for ETOH and SA   Abuse/Neglect/Trauma History (check all that apply)  Denies history   Abuse/Neglect/Trauma Comments:   none reported or noted   Psychiatric History (check all that apply)  Denies history   Psychiatric medications:  none reported or noted   Current Mental Health Hospitalizations/Previous Mental Health History:   none reported or noted   Current provider:   none reported or noted   Place and Date:   none reported or noted   Current Medications:   Scheduled Meds:      . amLODipine  5 mg Oral Daily  . aspirin  325 mg Oral Daily  . chlordiazePOXIDE  10 mg Oral TID  . enoxaparin (LOVENOX) injection  40 mg Subcutaneous Q24H  . feeding supplement  237 mL Oral TID BM  . folic acid  1 mg Oral Daily  . gabapentin  300 mg Oral TID  . insulin aspart  0-15 Units Subcutaneous TID WC  . insulin aspart  0-5 Units Subcutaneous QHS  . insulin aspart  6 Units Subcutaneous TID WC  . insulin glargine  34 Units Subcutaneous Daily  . lipase/protease/amylase  1 capsule Oral TID WC  . LORazepam  0-4 mg Intravenous Q12H  . mirtazapine  15 mg Oral QHS  . multivitamin with minerals  1 tablet Oral Daily  . phenytoin  200 mg Oral QHS  . senna  1 tablet Oral Daily  . simvastatin  10 mg Oral q1800  . thiamine  100 mg Oral Daily   Or     . thiamine  100 mg Intravenous Daily        Continuous Infusions:      . sodium chloride 10 mL/hr at 05/31/12 0820          PRN Meds:.dextrose, LORazepam, LORazepam, oxyCODONE       Previous Impatient  Admission/Date/Reason:   Pt has diabetes mellitus hypertension and chronic pancreatitis.  Pt has had 4 ED visits in the last 6 months.   Emotional Health / Current Symptoms    Suicide/Self Harm  None reported   Suicide attempt in the past:   none reported or noted   Other harmful behavior:   none reported or noted   Psychotic/Dissociative Symptoms  None reported   Other Psychotic/Dissociative Symptoms:   none reported or noted    Attention/Behavioral Symptoms  Within Normal Limits   Other Attention / Behavioral Symptoms:   none reported or noted    Cognitive Impairment  Orientation - Place  Orientation - Self  Orientation - Situation  Orientation - Time  Poor Judgement   Other Cognitive Impairment:   none reported or noted    Mood and Adjustment  Mood Congruent    Stress, Anxiety, Trauma, Any Recent Loss/Stressor  None reported   Anxiety (frequency):   minimal anxiety regarding not wanting to continue SA and ETOH abuse   Phobia (specify):   none reported or noted   Compulsive behavior (specify):   none reported or noted   Obsessive behavior (specify):   none reported or noted   Other:  Stressors: mother past away around 1 yr ago; pt is homeless; pt has limited support in the community;   Substance Abuse/Use  History of substance use  Current substance use  Substance abuse treatment needed   SBIRT completed (please refer for detailed history):  N  Self-reported substance use:   prior to admission UDS reports pt being positive for cocaine   Urinary Drug Screen Completed:   Alcohol level:   BAL 315 upon admission    Environmental/Housing/Living Arrangement  HOMELESS   Who is in the home:   pt homeless and lives on the streets   Emergency contact:  pt states has no supports   Financial  Medicaid   Patient's Strengths and Goals (patient's own words):   Pt is complaint with medical advice while in hospital.  pt realizes that he is in  need of ETOH and SA tx.   Clinical Social Worker's Interpretive Summary:   Psych CSW assessed pt at bedside.  Pt admits a long hx of ETOH and SA.  Pt denies SI/HI.  Pt denies AVHD.  Pt reports his mother recently expired.  Pt is having difficult time dealing with this loss.  Pt stated is is the only one left in his family.  Pt states that he abuses cocaine.  He states that he is also a daily drinker.  Pt reports spending his money and all his time and attention on getting high and drinking.  Pt states that he is ready to quit.  Psych CSW attempted to find placement for pt at either [facility] or [facility].  Neither had beds available.  Psych CSW assisted pt with creating goals and a plan for his d/c.  Pt has plans to f/u with the [facility] re: employment and the use of phone.  Pt states that he will try to get a bed at the [facility].  Pt reports he has not stayed there before. Pt was given homeless resources, SA/ETOH tx resources, hot meal resources and these were reviewed in great detail with the patient.  Pt acknowledges understanding of the resources, the goals and plans set and reports he will f/u upon d/c.   Disposition:  Psych Clinical Social Worker signing off   Vickii Penna, [place] [phone]  Clinical Social Work

## 2012-05-31 NOTE — Clinical Social Work Psych Note (Signed)
Psych CSW f/u with [facility] and [facility] to find there will be no tx beds available today.  Psych CSW made [facility] and MD aware.  RNCM states she will set pt up on [facility] program for pt meds.  Psych CSW to go over resource availability once again with pt to ensure pt is clear on options upon d/c.  Pt will be encouraged to f/u with wait list at [facility] and [facility] daily upon d/c.  Vickii Penna, LCSWA [phone]  Clinical Social Work

## 2012-05-31 NOTE — Progress Notes (Signed)
Gabriel Rush to be D/C'd to [facility] shelter per MD order.  Discussed with the patient and all questions fully answered.    Medication List    TAKE these medications       amLODipine 5 MG tablet  Commonly known as:  NORVASC  Take 1 tablet (5 mg total) by mouth daily.     chlordiazePOXIDE 10 MG capsule  Commonly known as:  LIBRIUM  Take 1 capsule (10 mg total) by mouth 3 (three) times daily. TAPER:   1 capsule three times a day for 3 days   1 capsule two times a day for 4 days  1 capsule once a day until gone.     gabapentin 300 MG capsule  Commonly known as:  NEURONTIN  Take 1 capsule (300 mg total) by mouth 3 (three) times daily.     insulin aspart 100 UNIT/ML injection  Commonly known as:  novoLOG  Inject 6 Units into the skin 3 (three) times daily before meals.     insulin glargine 100 UNIT/ML injection  Commonly known as:  LANTUS  Inject 0.34 mLs (34 Units total) into the skin daily.     metFORMIN 500 MG tablet  Commonly known as:  GLUCOPHAGE  Take 1 tablet (500 mg total) by mouth 2 (two) times daily with a meal.     mirtazapine 15 MG tablet  Commonly known as:  REMERON  Take 1 tablet (15 mg total) by mouth at bedtime.     oxyCODONE 5 MG immediate release tablet  Commonly known as:  Oxy IR/ROXICODONE  Take 1 tablet (5 mg total) by mouth every 6 (six) hours as needed.     phenytoin 100 MG ER capsule  Commonly known as:  DILANTIN  Take 2 capsules (200 mg total) by mouth at bedtime.     simvastatin 10 MG tablet  Commonly known as:  ZOCOR  Take 1 tablet (10 mg total) by mouth daily at 6 PM.     Syringe/Needle (Disp) 30G X 1/2" 1 ML Misc  Commonly known as:  B-D ECLIPSE SYRINGE  1 Syringe by Does not apply route 4 (four) times daily -  before meals and at bedtime.        VVS, Skin clean, dry and intact without evidence of skin break down, no evidence of skin tears noted. IV catheter discontinued intact. Site without signs and symptoms of  complications. Dressing and pressure applied.  An After Visit Summary was printed and given to the patient. Follow up appointments , new prescriptions and medication administration times given. Medications waiting at the outpatient pharmacy . Pt given letter to get free meds at pharmacy. Called security and they escorted pt to pharmacy at [facility] entrance Patient escorted via [facility] to [employer] entrance  Peter Congo [place], [place] 05/31/2012 3:13 PM

## 2012-05-31 NOTE — Progress Notes (Signed)
I have reviewed and agree with this note.  Gabriel Rush, [place] [phone] 05/31/2012

## 2012-05-31 NOTE — Care Management Note (Signed)
    Page 1 of 1   05/31/2012     4:47:00 PM   CARE MANAGEMENT NOTE 05/31/2012  Patient:  Gabriel Rush, Gabriel Rush   Account Number:  [id number]  Date Initiated:  05/29/2012  Documentation initiated by:  Junius Creamer  Subjective/Objective Assessment:   adm w dka     Action/Plan:   lives alone, chart states homeless, sw ref made.   Anticipated DC Date:  05/31/2012   Anticipated DC Plan:  HOME/SELF CARE  In-house referral  Clinical Social Worker      DC Planning Services  CM consult  MATCH Program      Choice offered to / List presented to:             Status of service:  Completed, signed off Medicare Important Message given?   (If response is "NO", the following Medicare IM given date fields will be blank) Date Medicare IM given:   Date Additional Medicare IM given:    Discharge Disposition:  HOME/SELF CARE  Per UR Regulation:  Reviewed for med. necessity/level of care/duration of stay  If discussed at Long Length of Stay Meetings, dates discussed:    Comments:  05/31/12 16:43 Letha Cape RN, [name] [phone] patient is homeless, phych CSW following, she tried to get patient into [place] and another facility but they did not have any beds,  patient will be going to [facility], he does not have any funds, will need to do an over ride to help with [employer], explained to patient that since we helped him with the Match Program in March this is absolute the last time we can do this.  I set him up with an appt at [facility] urgent care , informed him to be at [facility] urgent care on Thurs and Fri also at 8 am to enroll for orange card so he can have some help getting his medications.

## 2012-06-08 ENCOUNTER — Encounter (HOSPITAL_COMMUNITY): Payer: Self-pay | Admitting: Emergency Medicine

## 2012-06-08 ENCOUNTER — Emergency Department (HOSPITAL_COMMUNITY)
Admission: EM | Admit: 2012-06-08 | Discharge: 2012-06-08 | Disposition: A | Discharge status: Home / Self Care | Payer: Self-pay | Attending: Emergency Medicine | Admitting: Emergency Medicine

## 2012-06-08 DIAGNOSIS — R739 Hyperglycemia, unspecified: Secondary | ICD-10-CM

## 2012-06-08 DIAGNOSIS — Z87891 Personal history of nicotine dependence: Secondary | ICD-10-CM | POA: Insufficient documentation

## 2012-06-08 DIAGNOSIS — F1021 Alcohol dependence, in remission: Secondary | ICD-10-CM | POA: Insufficient documentation

## 2012-06-08 DIAGNOSIS — R109 Unspecified abdominal pain: Secondary | ICD-10-CM

## 2012-06-08 DIAGNOSIS — G8929 Other chronic pain: Secondary | ICD-10-CM | POA: Insufficient documentation

## 2012-06-08 DIAGNOSIS — Z8719 Personal history of other diseases of the digestive system: Secondary | ICD-10-CM | POA: Insufficient documentation

## 2012-06-08 DIAGNOSIS — G40909 Epilepsy, unspecified, not intractable, without status epilepticus: Secondary | ICD-10-CM | POA: Insufficient documentation

## 2012-06-08 DIAGNOSIS — R1013 Epigastric pain: Secondary | ICD-10-CM | POA: Insufficient documentation

## 2012-06-08 DIAGNOSIS — I10 Essential (primary) hypertension: Secondary | ICD-10-CM | POA: Insufficient documentation

## 2012-06-08 DIAGNOSIS — E1169 Type 2 diabetes mellitus with other specified complication: Secondary | ICD-10-CM | POA: Insufficient documentation

## 2012-06-08 DIAGNOSIS — Z794 Long term (current) use of insulin: Secondary | ICD-10-CM | POA: Insufficient documentation

## 2012-06-08 DIAGNOSIS — Z79899 Other long term (current) drug therapy: Secondary | ICD-10-CM | POA: Insufficient documentation

## 2012-06-08 LAB — CBC WITH DIFFERENTIAL/PLATELET
Basophils Relative: 1 % (ref 0–1)
Hemoglobin: 13.7 g/dL (ref 13.0–17.0)
Lymphocytes Relative: 25 % (ref 12–46)
MCHC: 37.2 g/dL — ABNORMAL HIGH (ref 30.0–36.0)
Monocytes Relative: 3 % (ref 3–12)
Neutro Abs: 2.9 10*3/uL (ref 1.7–7.7)
Neutrophils Relative %: 71 % (ref 43–77)
RDW: 11.5 % (ref 11.5–15.5)
WBC: 4.1 10*3/uL (ref 4.0–10.5)

## 2012-06-08 LAB — GLUCOSE, CAPILLARY
Glucose-Capillary: >600 mg/dL (ref 70–99)
Glucose-Capillary: 496 mg/dL — ABNORMAL HIGH (ref 70–99)
Glucose-Capillary: 345 mg/dL — ABNORMAL HIGH (ref 70–99)

## 2012-06-08 LAB — COMPREHENSIVE METABOLIC PANEL
AST: 50 U/L — ABNORMAL HIGH (ref 0–37)
BUN: 9 mg/dL (ref 6–23)
CO2: 21 mEq/L (ref 19–32)
Calcium: 9.4 mg/dL (ref 8.4–10.5)
Chloride: 86 mEq/L — ABNORMAL LOW (ref 96–112)
Creatinine, Ser: 0.87 mg/dL (ref 0.50–1.35)
GFR calc Af Amer: >90 mL/min (ref >90)
GFR calc non Af Amer: >90 mL/min (ref >90)
Glucose, Bld: 754 mg/dL (ref 70–99)
Total Bilirubin: 0.3 mg/dL (ref 0.3–1.2)

## 2012-06-08 LAB — LIPASE, BLOOD: Lipase: 15 U/L (ref 11–59)

## 2012-06-08 MED ORDER — ONDANSETRON HCL 4 MG/2ML IJ SOLN
4.0000 mg | Freq: Once | INTRAMUSCULAR | Status: AC
  Administered 2012-06-08: 4 mg via INTRAVENOUS
  Filled 2012-06-08: qty 2

## 2012-06-08 MED ORDER — PROMETHAZINE HCL 25 MG RE SUPP: 25.0000 mg | Freq: Four times a day (QID) | RECTAL | Status: DC | PRN

## 2012-06-08 MED ORDER — SODIUM CHLORIDE 0.9 % IV BOLUS (SEPSIS)
1000.0000 mL | Freq: Once | INTRAVENOUS | Status: AC
  Administered 2012-06-08: 1000 mL via INTRAVENOUS

## 2012-06-08 MED ORDER — HYDROMORPHONE HCL PF 1 MG/ML IJ SOLN
1.0000 mg | Freq: Once | INTRAMUSCULAR | Status: AC
  Administered 2012-06-08: 1 mg via INTRAVENOUS
  Filled 2012-06-08: qty 1

## 2012-06-08 MED ORDER — INSULIN ASPART 100 UNIT/ML ~~LOC~~ SOLN
20.0000 [IU] | Freq: Once | SUBCUTANEOUS | Status: AC
  Administered 2012-06-08: 20 [IU] via SUBCUTANEOUS
  Filled 2012-06-08: qty 1

## 2012-06-08 NOTE — ED Notes (Signed)
Patient Gabriel Rush was over 600!

## 2012-06-08 NOTE — ED Notes (Signed)
Pt discharged.Vital signs stable and GCS 15 

## 2012-06-08 NOTE — ED Notes (Signed)
CRITICAL VALUE ALERT  Critical value received:  754  Date of notification:  06/08/2012  Time of notification:  0301  Critical value read back:yes  Nurse who received alert:  MG Jenelle Mages, RN   MD notified (1st page):  Dr. Estell Harpin  Time of first page: 0301  MD notified (2nd page):  Time of second page:  Responding MD:  Dr. Estell Harpin  Time MD responded:  [phone]

## 2012-06-08 NOTE — ED Notes (Signed)
Patient called EMS complaining of a headache.  The patient advised them he is a diabetic and asked them to take his blood sugar.  According to EMS, the meter was reading High.  The patient was brought to the ED to be evaluated.

## 2012-06-08 NOTE — ED Provider Notes (Signed)
History     CSN: [id number]  Arrival date & time 06/08/12  0125   First MD Initiated Contact with Patient 06/08/12 [phone]      Chief Complaint  Patient presents with  . Hyperglycemia    (Consider location/radiation/quality/duration/timing/severity/associated sxs/prior treatment) Patient is a 52 y.o. male presenting with abdominal pain. The history is provided by the patient (the pt complains of abd pain and weakness).  Abdominal Pain Pain location:  Epigastric Pain quality: aching   Pain radiates to:  Does not radiate Pain severity:  Moderate Onset quality:  Gradual Timing:  Constant Progression:  Waxing and waning Associated symptoms: no chest pain, no cough, no diarrhea, no fatigue and no hematuria     Past Medical History  Diagnosis Date  . Diabetes mellitus   . Hypertension   . Seizures   . Pancreatitis   . Alcohol abuse   . Chronic abdominal pain   . Alcoholism /alcohol abuse   . Hepatitis     Past Surgical History  Procedure Laterality Date  . Appendectomy      Family History  Problem Relation Age of Onset  . Hypertension      History  Substance Use Topics  . Smoking status: Former Smoker    Types: Cigarettes    Quit date: 12/21/1984  . Smokeless tobacco: Never Used  . Alcohol Use: Yes     Comment: heavy drinker: binge drinking per pt      Review of Systems  Constitutional: Negative for appetite change and fatigue.  HENT: Negative for congestion, sinus pressure and ear discharge.   Eyes: Negative for discharge.  Respiratory: Negative for cough.   Cardiovascular: Negative for chest pain.  Gastrointestinal: Positive for abdominal pain. Negative for diarrhea.  Genitourinary: Negative for frequency and hematuria.  Musculoskeletal: Negative for back pain.  Skin: Negative for rash.  Neurological: Negative for seizures and headaches.  Psychiatric/Behavioral: Negative for hallucinations.    Allergies  Morphine and related  Home Medications    Current Outpatient Rx  Name  Route  Sig  Dispense  Refill  . amLODipine (NORVASC) 5 MG tablet   Oral   Take 1 tablet (5 mg total) by mouth daily.   30 tablet   6   . chlordiazePOXIDE (LIBRIUM) 10 MG capsule   Oral   Take 1 capsule (10 mg total) by mouth 3 (three) times daily. TAPER:  1 capsule three times a day for 3 days  1 capsule two times a day for 4 days 1 capsule once a day until gone.   30 capsule   0   . gabapentin (NEURONTIN) 300 MG capsule   Oral   Take 1 capsule (300 mg total) by mouth 3 (three) times daily.   90 capsule   6   . insulin aspart (NOVOLOG) 100 UNIT/ML injection   Subcutaneous   Inject 6 Units into the skin 3 (three) times daily before meals.   1 vial   6   . insulin glargine (LANTUS) 100 UNIT/ML injection   Subcutaneous   Inject 0.34 mLs (34 Units total) into the skin daily.   10 mL   6   . metFORMIN (GLUCOPHAGE) 500 MG tablet   Oral   Take 1 tablet (500 mg total) by mouth 2 (two) times daily with a meal.   60 tablet   6   . mirtazapine (REMERON) 15 MG tablet   Oral   Take 1 tablet (15 mg total) by mouth at bedtime.  30 tablet   6   . oxyCODONE (OXY IR/ROXICODONE) 5 MG immediate release tablet   Oral   Take 1 tablet (5 mg total) by mouth every 6 (six) hours as needed.   30 tablet   0   . phenytoin (DILANTIN) 100 MG ER capsule   Oral   Take 2 capsules (200 mg total) by mouth at bedtime.   60 capsule   6   . simvastatin (ZOCOR) 10 MG tablet   Oral   Take 1 tablet (10 mg total) by mouth daily at 6 PM.   30 tablet   0   . promethazine (PHENERGAN) 25 MG suppository   Rectal   Place 1 suppository (25 mg total) rectally every 6 (six) hours as needed for nausea.   12 each   0     BP 130/95  Pulse 110  Temp(Src) 98.5 F (36.9 C) (Oral)  Resp 21  SpO2 99%  Physical Exam  Constitutional: He is oriented to person, place, and time. He appears well-developed.  HENT:  Head: Normocephalic.  Eyes: Conjunctivae and EOM  are normal. No scleral icterus.  Neck: Neck supple. No thyromegaly present.  Cardiovascular: Normal rate and regular rhythm.  Exam reveals no gallop and no friction rub.   No murmur heard. Pulmonary/Chest: No stridor. He has no wheezes. He has no rales. He exhibits no tenderness.  Abdominal: He exhibits no distension. There is no tenderness. There is no rebound.  Musculoskeletal: Normal range of motion. He exhibits no edema.  Lymphadenopathy:    He has no cervical adenopathy.  Neurological: He is oriented to person, place, and time. Coordination normal.  Skin: No rash noted. No erythema.  Psychiatric: He has a normal mood and affect. His behavior is normal.    ED Course  Procedures (including critical care time)  Labs Reviewed  GLUCOSE, CAPILLARY - Abnormal; Notable for the following:    Glucose-Capillary >600 (*)    All other components within normal limits  CBC WITH DIFFERENTIAL - Abnormal; Notable for the following:    HCT 36.8 (*)    MCHC 37.2 (*)    All other components within normal limits  COMPREHENSIVE METABOLIC PANEL - Abnormal; Notable for the following:    Sodium 128 (*)    Chloride 86 (*)    Glucose, Bld 754 (*)    AST 50 (*)    Alkaline Phosphatase 120 (*)    All other components within normal limits  GLUCOSE, CAPILLARY - Abnormal; Notable for the following:    Glucose-Capillary 496 (*)    All other components within normal limits  GLUCOSE, CAPILLARY - Abnormal; Notable for the following:    Glucose-Capillary 345 (*)    All other components within normal limits  LIPASE, BLOOD   No results found.   1. Hyperglycemia   2. Abdominal pain       MDM          Benny Lennert, MD 06/08/12 [phone]

## 2012-06-11 ENCOUNTER — Inpatient Hospital Stay (HOSPITAL_COMMUNITY)
Admission: EM | Admit: 2012-06-11 | Discharge: 2012-06-15 | DRG: 638 | Disposition: A | Discharge status: Home / Self Care | Payer: Self-pay | Attending: Internal Medicine | Admitting: Internal Medicine

## 2012-06-11 ENCOUNTER — Encounter (HOSPITAL_COMMUNITY): Payer: Self-pay

## 2012-06-11 DIAGNOSIS — K2920 Alcoholic gastritis without bleeding: Secondary | ICD-10-CM | POA: Diagnosis present

## 2012-06-11 DIAGNOSIS — R569 Unspecified convulsions: Secondary | ICD-10-CM | POA: Diagnosis present

## 2012-06-11 DIAGNOSIS — K279 Peptic ulcer, site unspecified, unspecified as acute or chronic, without hemorrhage or perforation: Secondary | ICD-10-CM | POA: Diagnosis present

## 2012-06-11 DIAGNOSIS — E1110 Type 2 diabetes mellitus with ketoacidosis without coma: Secondary | ICD-10-CM

## 2012-06-11 DIAGNOSIS — K863 Pseudocyst of pancreas: Secondary | ICD-10-CM

## 2012-06-11 DIAGNOSIS — E119 Type 2 diabetes mellitus without complications: Secondary | ICD-10-CM

## 2012-06-11 DIAGNOSIS — Z9119 Patient's noncompliance with other medical treatment and regimen: Secondary | ICD-10-CM

## 2012-06-11 DIAGNOSIS — E860 Dehydration: Secondary | ICD-10-CM | POA: Diagnosis present

## 2012-06-11 DIAGNOSIS — F1410 Cocaine abuse, uncomplicated: Secondary | ICD-10-CM

## 2012-06-11 DIAGNOSIS — F1010 Alcohol abuse, uncomplicated: Secondary | ICD-10-CM | POA: Diagnosis present

## 2012-06-11 DIAGNOSIS — R197 Diarrhea, unspecified: Secondary | ICD-10-CM | POA: Diagnosis present

## 2012-06-11 DIAGNOSIS — Z79899 Other long term (current) drug therapy: Secondary | ICD-10-CM

## 2012-06-11 DIAGNOSIS — E1100 Type 2 diabetes mellitus with hyperosmolarity without nonketotic hyperglycemic-hyperosmolar coma (NKHHC): Principal | ICD-10-CM | POA: Diagnosis present

## 2012-06-11 DIAGNOSIS — F1910 Other psychoactive substance abuse, uncomplicated: Secondary | ICD-10-CM | POA: Diagnosis present

## 2012-06-11 DIAGNOSIS — G40909 Epilepsy, unspecified, not intractable, without status epilepticus: Secondary | ICD-10-CM | POA: Diagnosis present

## 2012-06-11 DIAGNOSIS — E1165 Type 2 diabetes mellitus with hyperglycemia: Secondary | ICD-10-CM | POA: Diagnosis present

## 2012-06-11 DIAGNOSIS — G8929 Other chronic pain: Secondary | ICD-10-CM | POA: Diagnosis present

## 2012-06-11 DIAGNOSIS — R109 Unspecified abdominal pain: Secondary | ICD-10-CM | POA: Diagnosis present

## 2012-06-11 DIAGNOSIS — I10 Essential (primary) hypertension: Secondary | ICD-10-CM | POA: Diagnosis present

## 2012-06-11 DIAGNOSIS — Z794 Long term (current) use of insulin: Secondary | ICD-10-CM

## 2012-06-11 DIAGNOSIS — F1020 Alcohol dependence, uncomplicated: Secondary | ICD-10-CM | POA: Diagnosis present

## 2012-06-11 DIAGNOSIS — R112 Nausea with vomiting, unspecified: Secondary | ICD-10-CM | POA: Diagnosis present

## 2012-06-11 DIAGNOSIS — Z91199 Patient's noncompliance with other medical treatment and regimen due to unspecified reason: Secondary | ICD-10-CM

## 2012-06-11 DIAGNOSIS — E876 Hypokalemia: Secondary | ICD-10-CM | POA: Diagnosis present

## 2012-06-11 DIAGNOSIS — K861 Other chronic pancreatitis: Secondary | ICD-10-CM | POA: Diagnosis present

## 2012-06-11 DIAGNOSIS — K3184 Gastroparesis: Secondary | ICD-10-CM | POA: Diagnosis present

## 2012-06-11 DIAGNOSIS — E1149 Type 2 diabetes mellitus with other diabetic neurological complication: Secondary | ICD-10-CM | POA: Diagnosis present

## 2012-06-11 LAB — CBC WITH DIFFERENTIAL/PLATELET
Basophils Absolute: 0.1 10*3/uL (ref 0.0–0.1)
Eosinophils Absolute: 0 10*3/uL (ref 0.0–0.7)
HCT: 34.5 % — ABNORMAL LOW (ref 39.0–52.0)
Lymphs Abs: 1.5 10*3/uL (ref 0.7–4.0)
MCHC: 37.1 g/dL — ABNORMAL HIGH (ref 30.0–36.0)
MCV: 83.7 fL (ref 78.0–100.0)
Monocytes Relative: 6 % (ref 3–12)
Neutro Abs: 3.8 10*3/uL (ref 1.7–7.7)
Platelets: 222 10*3/uL (ref 150–400)
RDW: 11.3 % — ABNORMAL LOW (ref 11.5–15.5)
WBC: 5.7 10*3/uL (ref 4.0–10.5)

## 2012-06-11 LAB — URINALYSIS, ROUTINE W REFLEX MICROSCOPIC
Glucose, UA: >1000 mg/dL — AB
Hgb urine dipstick: NEGATIVE
Specific Gravity, Urine: 1.039 — ABNORMAL HIGH (ref 1.005–1.030)
pH: 5.5 (ref 5.0–8.0)

## 2012-06-11 LAB — BLOOD GAS, VENOUS
Bicarbonate: 22.8 mEq/L (ref 20.0–24.0)
TCO2: 20.6 mmol/L (ref 0–100)
pCO2, Ven: 38.1 mmHg — ABNORMAL LOW (ref 45.0–50.0)
pH, Ven: 7.393 — ABNORMAL HIGH (ref 7.250–7.300)

## 2012-06-11 LAB — BASIC METABOLIC PANEL
BUN: 9 mg/dL (ref 6–23)
GFR calc Af Amer: >90 mL/min (ref >90)
GFR calc non Af Amer: >90 mL/min (ref >90)
Glucose, Bld: 228 mg/dL — ABNORMAL HIGH (ref 70–99)
Potassium: 3.1 mEq/L — ABNORMAL LOW (ref 3.5–5.1)
Sodium: 138 mEq/L (ref 135–145)

## 2012-06-11 LAB — ETHANOL: Alcohol, Ethyl (B): <11 mg/dL (ref 0–11)

## 2012-06-11 LAB — GLUCOSE, CAPILLARY
Glucose-Capillary: >600 mg/dL (ref 70–99)
Glucose-Capillary: 332 mg/dL — ABNORMAL HIGH (ref 70–99)
Glucose-Capillary: 381 mg/dL — ABNORMAL HIGH (ref 70–99)
Glucose-Capillary: 278 mg/dL — ABNORMAL HIGH (ref 70–99)

## 2012-06-11 LAB — URINE MICROSCOPIC-ADD ON: Urine-Other: NONE SEEN

## 2012-06-11 LAB — MRSA PCR SCREENING: MRSA by PCR: NEGATIVE

## 2012-06-11 LAB — RAPID URINE DRUG SCREEN, HOSP PERFORMED: Barbiturates: NOT DETECTED

## 2012-06-11 LAB — TROPONIN I: Troponin I: <0.3 ng/mL (ref <0.30)

## 2012-06-11 LAB — LIPASE, BLOOD: Lipase: 14 U/L (ref 11–59)

## 2012-06-11 MED ORDER — SODIUM CHLORIDE 0.9 % IV SOLN
INTRAVENOUS | Status: DC
  Administered 2012-06-12: 04:00:00 via INTRAVENOUS

## 2012-06-11 MED ORDER — SODIUM CHLORIDE 0.9 % IV BOLUS (SEPSIS)
1000.0000 mL | Freq: Once | INTRAVENOUS | Status: AC
  Administered 2012-06-11: 1000 mL via INTRAVENOUS

## 2012-06-11 MED ORDER — SODIUM CHLORIDE 0.9 % IJ SOLN
3.0000 mL | Freq: Two times a day (BID) | INTRAMUSCULAR | Status: DC
  Administered 2012-06-11: 3 mL via INTRAVENOUS
  Administered 2012-06-12: 10 mL via INTRAVENOUS
  Administered 2012-06-13 – 2012-06-14 (×3): 3 mL via INTRAVENOUS

## 2012-06-11 MED ORDER — INSULIN REGULAR BOLUS VIA INFUSION
0.0000 [IU] | Freq: Three times a day (TID) | INTRAVENOUS | Status: DC
  Filled 2012-06-11: qty 10

## 2012-06-11 MED ORDER — POTASSIUM CHLORIDE CRYS ER 20 MEQ PO TBCR
40.0000 meq | EXTENDED_RELEASE_TABLET | Freq: Once | ORAL | Status: AC
  Administered 2012-06-11: 40 meq via ORAL
  Filled 2012-06-11: qty 2

## 2012-06-11 MED ORDER — ONDANSETRON HCL 4 MG/2ML IJ SOLN
4.0000 mg | INTRAMUSCULAR | Status: AC
  Administered 2012-06-11: 4 mg via INTRAVENOUS
  Filled 2012-06-11: qty 2

## 2012-06-11 MED ORDER — DEXTROSE 50 % IV SOLN: 25.0000 mL | INTRAVENOUS | Status: DC | PRN

## 2012-06-11 MED ORDER — ASPIRIN EC 81 MG PO TBEC
81.0000 mg | DELAYED_RELEASE_TABLET | Freq: Every day | ORAL | Status: DC
  Administered 2012-06-11 – 2012-06-15 (×5): 81 mg via ORAL
  Filled 2012-06-11 (×5): qty 1

## 2012-06-11 MED ORDER — ONDANSETRON HCL 4 MG PO TABS: 4.0000 mg | ORAL_TABLET | Freq: Four times a day (QID) | ORAL | Status: DC | PRN

## 2012-06-11 MED ORDER — ENOXAPARIN SODIUM 40 MG/0.4ML ~~LOC~~ SOLN
40.0000 mg | SUBCUTANEOUS | Status: DC
  Administered 2012-06-11 – 2012-06-14 (×4): 40 mg via SUBCUTANEOUS
  Filled 2012-06-11 (×5): qty 0.4

## 2012-06-11 MED ORDER — ONDANSETRON HCL 4 MG/2ML IJ SOLN: 4.0000 mg | Freq: Four times a day (QID) | INTRAMUSCULAR | Status: DC | PRN

## 2012-06-11 MED ORDER — PANTOPRAZOLE SODIUM 40 MG IV SOLR
40.0000 mg | Freq: Two times a day (BID) | INTRAVENOUS | Status: DC
  Administered 2012-06-11 – 2012-06-13 (×4): 40 mg via INTRAVENOUS
  Filled 2012-06-11 (×5): qty 40

## 2012-06-11 MED ORDER — HYDROMORPHONE HCL PF 1 MG/ML IJ SOLN
1.0000 mg | Freq: Once | INTRAMUSCULAR | Status: AC
  Administered 2012-06-11: 1 mg via INTRAVENOUS
  Filled 2012-06-11: qty 1

## 2012-06-11 MED ORDER — DEXTROSE-NACL 5-0.45 % IV SOLN
INTRAVENOUS | Status: DC
  Administered 2012-06-11: 22:00:00 via INTRAVENOUS

## 2012-06-11 MED ORDER — SODIUM CHLORIDE 0.9 % IV SOLN
INTRAVENOUS | Status: DC
  Administered 2012-06-11: 5.6 [IU]/h via INTRAVENOUS
  Filled 2012-06-11: qty 1

## 2012-06-11 MED ORDER — KETOROLAC TROMETHAMINE 15 MG/ML IJ SOLN
15.0000 mg | Freq: Four times a day (QID) | INTRAMUSCULAR | Status: DC | PRN
  Administered 2012-06-11 – 2012-06-12 (×2): 30 mg via INTRAVENOUS
  Filled 2012-06-11 (×2): qty 1

## 2012-06-11 MED ORDER — SODIUM CHLORIDE 0.9 % IV SOLN
INTRAVENOUS | Status: DC
  Administered 2012-06-11: 4.4 [IU]/h via INTRAVENOUS
  Administered 2012-06-11: 2.7 [IU]/h via INTRAVENOUS
  Filled 2012-06-11: qty 1

## 2012-06-11 NOTE — ED Notes (Signed)
Per EMS pt was arrested for shop lifting and then c/o dizzy, blurred vision and CBG read HIGH.

## 2012-06-11 NOTE — ED Provider Notes (Signed)
History     CSN: [id number]  Arrival date & time 06/11/12  1429   First MD Initiated Contact with Patient 06/11/12 1500      Chief Complaint  Patient presents with  . Hyperglycemia    (Consider location/radiation/quality/duration/timing/severity/associated sxs/prior treatment) HPI Comments: Patient is a 52 year old male with a history of hypertension, diabetes, and chronic pancreatitis who presents for a variety of complaints. Patient has been complaining of intermittent blurred vision, dizziness, polyuria, and polydipsia since this morning. He states that he has not taken any of his diabetic medications in the last 3 days as "they are at one of my girls houses and I need to give her some space." Patient denies any aggravating or alleviating factors the symptoms. Patient also complains of abdominal pain in his epigastric and left upper quadrant x2 days. Patient states the pain is intermittent, "gripping" in wavelike, radiating across to his right upper quadrant around to his midthoracic back. Patient denies any aggravating or alleviating factors of this abdominal pain and states that it feels similar to an acute on chronic pancreatitis flare. Patient is to associated nausea with one episode of nonbloody, nonbilious emesis this morning. Patient denies fever, loss of consciousness, chest pain, shortness of breath, melena or hematochezia, dysuria or hematuria, and numbness or tingling in his extremities. Patient has history of diabetic neuropathy in his right lower extremity and states that this numbness sensation is at baseline.  The history is provided by the patient. No language interpreter was used.    Past Medical History  Diagnosis Date  . Diabetes mellitus   . Hypertension   . Seizures   . Pancreatitis   . Alcohol abuse   . Chronic abdominal pain   . Alcoholism /alcohol abuse   . Hepatitis     Past Surgical History  Procedure Laterality Date  . Appendectomy      Family History   Problem Relation Age of Onset  . Hypertension      History  Substance Use Topics  . Smoking status: Former Smoker    Types: Cigarettes    Quit date: 12/21/1984  . Smokeless tobacco: Never Used  . Alcohol Use: Yes     Comment: heavy drinker: binge drinking per pt      Review of Systems  Constitutional: Negative for fever.  HENT: Negative for trouble swallowing.   Eyes: Positive for visual disturbance.  Respiratory: Negative for shortness of breath.   Cardiovascular: Negative for chest pain.  Gastrointestinal: Positive for nausea, vomiting and abdominal pain. Negative for constipation and blood in stool.  Endocrine: Negative for polydipsia and polyuria.  Skin: Negative for color change.  Neurological: Positive for dizziness and headaches. Negative for syncope and weakness.  All other systems reviewed and are negative.    Allergies  Morphine and related  Home Medications   Current Outpatient Rx  Name  Route  Sig  Dispense  Refill  . amLODipine (NORVASC) 5 MG tablet   Oral   Take 1 tablet (5 mg total) by mouth daily.   30 tablet   6   . chlordiazePOXIDE (LIBRIUM) 10 MG capsule   Oral   Take 10 mg by mouth daily.         Marland Kitchen gabapentin (NEURONTIN) 300 MG capsule   Oral   Take 1 capsule (300 mg total) by mouth 3 (three) times daily.   90 capsule   6   . insulin aspart (NOVOLOG) 100 UNIT/ML injection   Subcutaneous   Inject 6  Units into the skin 3 (three) times daily before meals.   1 vial   6   . insulin glargine (LANTUS) 100 UNIT/ML injection   Subcutaneous   Inject 0.34 mLs (34 Units total) into the skin daily.   10 mL   6   . metFORMIN (GLUCOPHAGE) 500 MG tablet   Oral   Take 1 tablet (500 mg total) by mouth 2 (two) times daily with a meal.   60 tablet   6   . mirtazapine (REMERON) 15 MG tablet   Oral   Take 1 tablet (15 mg total) by mouth at bedtime.   30 tablet   6   . oxyCODONE (OXY IR/ROXICODONE) 5 MG immediate release tablet   Oral    Take 5 mg by mouth every 6 (six) hours as needed for pain.         . phenytoin (DILANTIN) 100 MG ER capsule   Oral   Take 2 capsules (200 mg total) by mouth at bedtime.   60 capsule   6   . simvastatin (ZOCOR) 10 MG tablet   Oral   Take 1 tablet (10 mg total) by mouth daily at 6 PM.   30 tablet   0   . promethazine (PHENERGAN) 25 MG suppository   Rectal   Place 1 suppository (25 mg total) rectally every 6 (six) hours as needed for nausea.   12 each   0     BP 110/77  Pulse 83  Temp(Src) 98.5 F (36.9 C)  Resp 20  SpO2 99%  Physical Exam  Nursing note and vitals reviewed. Constitutional: He is oriented to person, place, and time. He appears well-developed and well-nourished. No distress.  HENT:  Head: Normocephalic and atraumatic.  Mouth/Throat: No oropharyngeal exudate.  Oropharynx clear. Mucous membranes dry.  Eyes: Conjunctivae and EOM are normal. Pupils are equal, round, and reactive to light. Right eye exhibits no discharge. Left eye exhibits no discharge. No scleral icterus.  Neck: Normal range of motion. Neck supple.  Cardiovascular: Normal rate, regular rhythm, normal heart sounds and intact distal pulses.   Distal radial, dorsalis pedis, and posterior tibial pulses 2+ bilaterally. Capillary refill normal in all extremities.  Pulmonary/Chest: Effort normal and breath sounds normal. No respiratory distress. He has no wheezes. He has no rales.  Abdominal: Soft. He exhibits no distension and no mass. There is no rebound.  Diffuse tenderness to palpation with voluntary guarding No peritoneal signs or palpable masses.  Musculoskeletal: Normal range of motion.  Lymphadenopathy:    He has no cervical adenopathy.  Neurological: He is alert and oriented to person, place, and time.  Skin: Skin is warm and dry. No rash noted. He is not diaphoretic. No erythema. No pallor.  Psychiatric: He has a normal mood and affect. His behavior is normal.    ED Course  Procedures  (including critical care time)  Labs Reviewed  GLUCOSE, CAPILLARY - Abnormal; Notable for the following:    Glucose-Capillary >600 (*)    All other components within normal limits  CBC WITH DIFFERENTIAL - Abnormal; Notable for the following:    RBC 4.12 (*)    Hemoglobin 12.8 (*)    HCT 34.5 (*)    MCHC 37.1 (*)    RDW 11.3 (*)    All other components within normal limits  BASIC METABOLIC PANEL - Abnormal; Notable for the following:    Sodium 129 (*)    Chloride 90 (*)    Glucose, Bld 639 (*)  All other components within normal limits  URINALYSIS, ROUTINE W REFLEX MICROSCOPIC - Abnormal; Notable for the following:    Specific Gravity, Urine 1.039 (*)    Glucose, UA >1000 (*)    All other components within normal limits  BLOOD GAS, VENOUS - Abnormal; Notable for the following:    pH, Ven 7.393 (*)    pCO2, Ven 38.1 (*)    pO2, Ven 60.7 (*)    All other components within normal limits  LIPASE, BLOOD  URINE MICROSCOPIC-ADD ON  BLOOD GAS, VENOUS  BLOOD GAS, VENOUS  BLOOD GAS, VENOUS  BLOOD GAS, VENOUS  BLOOD GAS, VENOUS   No results found.   1. DKA (diabetic ketoacidosis)      MDM  Patient presents for signs and symptoms of hyperglycemia. Admits to noncompliance of medication for the last 3 days. Patient also with a history of chronic pancreatitis, complaining of abdominal pain which feels similar in severity, location, and quality to past acute on chronic pancreatitis episodes. CBG > 600 at triage. Patient given half liter bolus of fluid by EMS. Workup to include CBC, BMP, lipase, VBG, and UA. IV fluid bolus ordered.  Patient with anion gap of 17 consistent with metabolic acidosis, likely from DKA. Potassium 3.5, at the low end of normal. Venous blood gas - 7.393. Lipase normal not consistent with pancreatitis. Second IVF bolus ordered  Glucose stabilizer ordered as well as PO potassium. Consult placed to hospitalist for admission; patient seen in ED 3 days ago for  hyperglycemia and d/c'd. Patient unable to manage diabetes as outpatient; believe he would be better managed as inpatient.  Have spoken with Dr. Eben Burow who will admit the patient for management of his DKA. Patient stable at this time without any complaints. Will continue to monitor until brought to the floor.   Filed Vitals:   06/11/12 1437 06/11/12 1826  BP: 110/77   Pulse: 83   Temp: 98.5 F (36.9 C)   Resp: 20   Height:  5\' 11"  (1.803 m)  Weight:  158 lb 11.7 oz (72 kg)  SpO2: 99%         Antony Madura, PA-C 06/11/12 1841

## 2012-06-11 NOTE — ED Notes (Signed)
[id number]<[id number]> Expected date:<BR> Expected time:<BR> Means of arrival:<BR> Comments:<BR> Hyperglycemic

## 2012-06-11 NOTE — ED Notes (Signed)
Attempted to call report to [place], RN in stepdown, unavailable for report at this time

## 2012-06-11 NOTE — ED Notes (Signed)
Attempted to give report to RN on 4th floor twice

## 2012-06-11 NOTE — ED Notes (Addendum)
Pt is noncompliant with his meds, ems gave pt 900cc NS, 16g to rt [facility]

## 2012-06-11 NOTE — H&P (Signed)
Admission note triad hospitalist   Date: 06/11/2012               Patient Name:  Gabriel Rush MRN: [id number]  DOB: [birth year] Age / Sex: 52 y.o., male   PCP: No PCP Per Patient              Medical Service:  triad hospitalist               Attending Physician: Dr. Raeford Razor, MD       Chief Complaint: nausea, Vomiting, diarrhea and abdominal pain  History of Present Illness: Gabriel Rush is a 52 y.o. male with known history of diabetes mellitus type 2, hypertension and chronic pancreatitis with polysubstance abuse including alcohol and cocaine presented to the ER because of nausea, vomiting and abdominal pain. Patient was recently discharged on April 24 when he was admitted for similar complaints. Patient states his symptoms started 2 days prior toadmission with nausea, vomiting and abdominal pain. The pain is mostly located in the epigastric area radiating to the back. Denies any fever chills. Denies any falls or trauma. There is no weakness in other extremities or denies any associated slurred speech difficulty swallowing or blood lesion. Patient also endorses diarrhea since last 2 weeks. 3-4 bowel movements everyday. Patient in the ER was found to be in DKA and has been started on IV insulin infusion and will be admitted for further management. Patient states he has been not taking his medications for more than a month as he forgot them in his girlfriend's house.   Current Outpatient Medications: Current facility-administered medications:insulin regular (NOVOLIN R,HUMULIN R) 1 Units/mL in sodium chloride 0.9 % 100 mL infusion, , Intravenous, Continuous, Antony Madura, PA-C, Last Rate: 2.7 mL/hr at 06/11/12 1835, 2.7 Units/hr at 06/11/12 1835 Current outpatient prescriptions:amLODipine (NORVASC) 5 MG tablet, Take 1 tablet (5 mg total) by mouth daily., Disp: 30 tablet, Rfl: 6;  chlordiazePOXIDE (LIBRIUM) 10 MG capsule, Take 10 mg by mouth daily., Disp: , Rfl: ;  gabapentin (NEURONTIN)  300 MG capsule, Take 1 capsule (300 mg total) by mouth 3 (three) times daily., Disp: 90 capsule, Rfl: 6 insulin aspart (NOVOLOG) 100 UNIT/ML injection, Inject 6 Units into the skin 3 (three) times daily before meals., Disp: 1 vial, Rfl: 6;  insulin glargine (LANTUS) 100 UNIT/ML injection, Inject 0.34 mLs (34 Units total) into the skin daily., Disp: 10 mL, Rfl: 6;  metFORMIN (GLUCOPHAGE) 500 MG tablet, Take 1 tablet (500 mg total) by mouth 2 (two) times daily with a meal., Disp: 60 tablet, Rfl: 6 mirtazapine (REMERON) 15 MG tablet, Take 1 tablet (15 mg total) by mouth at bedtime., Disp: 30 tablet, Rfl: 6;  oxyCODONE (OXY IR/ROXICODONE) 5 MG immediate release tablet, Take 5 mg by mouth every 6 (six) hours as needed for pain., Disp: , Rfl: ;  phenytoin (DILANTIN) 100 MG ER capsule, Take 2 capsules (200 mg total) by mouth at bedtime., Disp: 60 capsule, Rfl: 6 simvastatin (ZOCOR) 10 MG tablet, Take 1 tablet (10 mg total) by mouth daily at 6 PM., Disp: 30 tablet, Rfl: 0;  promethazine (PHENERGAN) 25 MG suppository, Place 1 suppository (25 mg total) rectally every 6 (six) hours as needed for nausea., Disp: 12 each, Rfl: 0  Allergies: Allergies  Allergen Reactions  . Morphine And Related Itching     Past Medical History: Past Medical History  Diagnosis Date  . Diabetes mellitus   . Hypertension   . Seizures   . Pancreatitis   . Alcohol  abuse   . Chronic abdominal pain   . Alcoholism /alcohol abuse   . Hepatitis     Past Surgical History: Past Surgical History  Procedure Laterality Date  . Appendectomy      Family History: Family History  Problem Relation Age of Onset  . Hypertension      Social History: History   Social History  . Marital Status: Single    Spouse Name: N/A    Number of Children: N/A  . Years of Education: N/A   Occupational History  . Not on file.   Social History Main Topics  . Smoking status: Former Smoker    Types: Cigarettes    Quit date: 12/21/1984  .  Smokeless tobacco: Never Used  . Alcohol Use: Yes     Comment: heavy drinker: binge drinking per pt  . Drug Use: Yes    Special: Marijuana  . Sexually Active: Not on file   Other Topics Concern  . Not on file   Social History Narrative  . No narrative on file    15 point review of systems is negative except what is noted above in the history of present illness.  Vital Signs: Blood pressure 110/77, pulse 83, temperature 98.5 F (36.9 C), resp. rate 20, height 5\' 11"  (1.803 m), weight 158 lb 11.7 oz (72 kg), SpO2 99.00%.  Physical Exam: General: Vital signs reviewed and noted. Well-developed, well-nourished, in some acute distress from pain; alert, appropriate and cooperative throughout examination.  Head: Normocephalic, atraumatic.  Eyes: PERRL, EOMI, No signs of anemia or jaundince.  Nose: Mucous membranes moist, not inflammed, nonerythematous.  Throat: Oropharynx nonerythematous, no exudate appreciated.   Neck: No deformities, masses, or tenderness noted.Supple, No carotid Bruits, no JVD.  Lungs:  Normal respiratory effort. Clear to auscultation BL without crackles or wheezes.  Heart: RRR. S1 and S2 normal without gallop, murmur, or rubs.  Abdomen:  BS normoactive. Soft, Nondistended, tender to touch.  No masses or organomegaly.  Extremities: No pretibial edema.  Neurologic: A&O X3, CN II - XII are grossly intact. Motor strength is 5/5 in the all 4 extremities, Sensations intact to light touch, Cerebellar signs negative.  Skin: No visible rashes, scars.   Lab results: Basic Metabolic Panel:  Recent Labs  16/10/96 1515  NA 129*  K 3.5  CL 90*  CO2 22  GLUCOSE 639*  BUN 9  CREATININE 0.82  CALCIUM 9.3    Recent Labs  06/11/12 1515  LIPASE 14   CBC:  Recent Labs  06/11/12 1515  WBC 5.7  NEUTROABS 3.8  HGB 12.8*  HCT 34.5*  MCV 83.7  PLT 222   CBG:  Recent Labs  06/11/12 1436 06/11/12 1834  GLUCAP >600* 332*   Urine Drug Screen: Drugs of Abuse      Component Value Date/Time   LABOPIA NONE DETECTED 05/28/2012 1948   COCAINSCRNUR POSITIVE* 05/28/2012 1948   LABBENZ NONE DETECTED 05/28/2012 1948   AMPHETMU NONE DETECTED 05/28/2012 1948   THCU NONE DETECTED 05/28/2012 1948   LABBARB NONE DETECTED 05/28/2012 1948    Urinalysis:  Recent Labs  06/11/12 1540  COLORURINE YELLOW  LABSPEC 1.039*  PHURINE 5.5  GLUCOSEU >1000*  HGBUR NEGATIVE  BILIRUBINUR NEGATIVE  KETONESUR NEGATIVE  PROTEINUR NEGATIVE  UROBILINOGEN 0.2  NITRITE NEGATIVE  LEUKOCYTESUR NEGATIVE   Imaging results:  No results found.   Other results: EKG:    Assessment & Plan:  Pt is a 52 y.o. yo male with a PMHx of diabetes  who was admitted on 06/11/2012 with symptoms of nausea, vomiting and abdominal pain, which was determined to be secondary to diabetes ketoacidosis. Interventions at this time will be focused on correcting electrolyte abnormalities.    1) Diabetic Ketoacidosis - The patient does have known history of diabetes mellitus, therefore, this acute presentation likely does not represent new diabetes diagnosis. Patient with typical expected symptoms including vomiting. Urinalysis show > 1000 glucose, (+) ketones. This acute occurrence is thought to have been precipitated by medication on compliance. Patient without known cardiac disease or family history of early CAD, therefore, acute cardiac pathology is considered, but thought to be less likely. Patient otherwise denies drug abuse such as cocaine. Denies excessive alcohol abuse.  - DKA protocol  - Aggressive IVF  - Check serum alcohol level and UDS  - Check HgA1c  - Check TSH  -Cardiac enzymes x 3 -am 12 lead EKG -pain control with ketorolac(avoid narcotics) - CM to assess for financial programs to aid in assistance with insulin.   2) Metabolic derangements - ABG was performed today, results showing Normal with AG of 17 additionally with a respiratory alkalosis component likely secondary to  hyperventilation. No fevers, change in mental status to suggest methanol or ethylene glycol toxicity. No recent new medications. No renal failure and mental status changes to account for uremia. - Will work towards treating #1 and monitor.  - Check UDS and serum alcohol levels.    3) DVT PPX - low molecular weight heparin  4) Chronic Diarrhea - Likely from chronic pancreatitis. May consider starting on pancreatic enzymes at discharge.  Hold all oral medications at this time.  Lars Mage MD [facility] hospitalist 06/11/2012, 6:58 PM

## 2012-06-12 DIAGNOSIS — E876 Hypokalemia: Secondary | ICD-10-CM

## 2012-06-12 DIAGNOSIS — E1100 Type 2 diabetes mellitus with hyperosmolarity without nonketotic hyperglycemic-hyperosmolar coma (NKHHC): Secondary | ICD-10-CM | POA: Diagnosis present

## 2012-06-12 DIAGNOSIS — R112 Nausea with vomiting, unspecified: Secondary | ICD-10-CM | POA: Diagnosis present

## 2012-06-12 DIAGNOSIS — E860 Dehydration: Secondary | ICD-10-CM

## 2012-06-12 DIAGNOSIS — R197 Diarrhea, unspecified: Secondary | ICD-10-CM

## 2012-06-12 LAB — BASIC METABOLIC PANEL
CO2: 24 mEq/L (ref 19–32)
Chloride: 106 mEq/L (ref 96–112)
Creatinine, Ser: 0.52 mg/dL (ref 0.50–1.35)
Potassium: 3.1 mEq/L — ABNORMAL LOW (ref 3.5–5.1)
Sodium: 139 mEq/L (ref 135–145)

## 2012-06-12 LAB — TSH: TSH: 0.36 u[IU]/mL (ref 0.350–4.500)

## 2012-06-12 LAB — TROPONIN I: Troponin I: <0.3 ng/mL (ref <0.30)

## 2012-06-12 LAB — GLUCOSE, CAPILLARY
Glucose-Capillary: 248 mg/dL — ABNORMAL HIGH (ref 70–99)
Glucose-Capillary: 152 mg/dL — ABNORMAL HIGH (ref 70–99)
Glucose-Capillary: 219 mg/dL — ABNORMAL HIGH (ref 70–99)
Glucose-Capillary: 252 mg/dL — ABNORMAL HIGH (ref 70–99)

## 2012-06-12 LAB — HEMOGLOBIN A1C: Hgb A1c MFr Bld: 14.3 % — ABNORMAL HIGH (ref <5.7)

## 2012-06-12 MED ORDER — ADULT MULTIVITAMIN W/MINERALS CH
1.0000 | ORAL_TABLET | Freq: Every day | ORAL | Status: DC
  Administered 2012-06-12 – 2012-06-15 (×4): 1 via ORAL
  Filled 2012-06-12 (×4): qty 1

## 2012-06-12 MED ORDER — LORAZEPAM 2 MG/ML IJ SOLN: 1.0000 mg | Freq: Four times a day (QID) | INTRAMUSCULAR | Status: AC | PRN

## 2012-06-12 MED ORDER — GLUCERNA SHAKE PO LIQD
237.0000 mL | Freq: Two times a day (BID) | ORAL | Status: DC
  Administered 2012-06-13 – 2012-06-15 (×4): 237 mL via ORAL
  Filled 2012-06-12 (×8): qty 237

## 2012-06-12 MED ORDER — INSULIN GLARGINE 100 UNIT/ML ~~LOC~~ SOLN
12.0000 [IU] | Freq: Every day | SUBCUTANEOUS | Status: DC
  Administered 2012-06-12: 12 [IU] via SUBCUTANEOUS
  Filled 2012-06-12: qty 0.12

## 2012-06-12 MED ORDER — ACETAMINOPHEN 325 MG PO TABS: 650.0000 mg | ORAL_TABLET | Freq: Four times a day (QID) | ORAL | Status: DC | PRN

## 2012-06-12 MED ORDER — INSULIN ASPART 100 UNIT/ML ~~LOC~~ SOLN
0.0000 [IU] | Freq: Three times a day (TID) | SUBCUTANEOUS | Status: DC
  Administered 2012-06-12: 3 [IU] via SUBCUTANEOUS
  Administered 2012-06-12: 7 [IU] via SUBCUTANEOUS
  Administered 2012-06-13 (×3): 5 [IU] via SUBCUTANEOUS
  Administered 2012-06-14: 3 [IU] via SUBCUTANEOUS
  Administered 2012-06-14 (×2): 2 [IU] via SUBCUTANEOUS
  Administered 2012-06-15: 3 [IU] via SUBCUTANEOUS
  Administered 2012-06-15: 7 [IU] via SUBCUTANEOUS

## 2012-06-12 MED ORDER — HYDROMORPHONE HCL PF 1 MG/ML IJ SOLN
0.5000 mg | INTRAMUSCULAR | Status: DC | PRN
  Administered 2012-06-12 – 2012-06-15 (×10): 0.5 mg via INTRAVENOUS
  Administered 2012-06-15: 03:00:00 via INTRAVENOUS
  Filled 2012-06-12 (×11): qty 1

## 2012-06-12 MED ORDER — LORAZEPAM 1 MG PO TABS
1.0000 mg | ORAL_TABLET | Freq: Four times a day (QID) | ORAL | Status: AC | PRN
  Administered 2012-06-12 – 2012-06-14 (×8): 1 mg via ORAL
  Filled 2012-06-12 (×8): qty 1

## 2012-06-12 MED ORDER — POTASSIUM CHLORIDE CRYS ER 20 MEQ PO TBCR
40.0000 meq | EXTENDED_RELEASE_TABLET | Freq: Once | ORAL | Status: AC
  Administered 2012-06-12: 40 meq via ORAL
  Filled 2012-06-12: qty 2

## 2012-06-12 MED ORDER — SODIUM CHLORIDE 0.9 % IV SOLN
INTRAVENOUS | Status: DC
  Administered 2012-06-12 – 2012-06-13 (×4): via INTRAVENOUS
  Administered 2012-06-14: 125 mL/h via INTRAVENOUS

## 2012-06-12 MED ORDER — VITAMIN B-1 100 MG PO TABS
100.0000 mg | ORAL_TABLET | Freq: Every day | ORAL | Status: DC
  Administered 2012-06-12 – 2012-06-15 (×4): 100 mg via ORAL
  Filled 2012-06-12 (×4): qty 1

## 2012-06-12 MED ORDER — FOLIC ACID 1 MG PO TABS
1.0000 mg | ORAL_TABLET | Freq: Every day | ORAL | Status: DC
  Administered 2012-06-12 – 2012-06-15 (×4): 1 mg via ORAL
  Filled 2012-06-12 (×4): qty 1

## 2012-06-12 MED ORDER — GABAPENTIN 300 MG PO CAPS
300.0000 mg | ORAL_CAPSULE | Freq: Three times a day (TID) | ORAL | Status: DC
  Administered 2012-06-12 – 2012-06-15 (×9): 300 mg via ORAL
  Filled 2012-06-12 (×11): qty 1

## 2012-06-12 MED ORDER — INSULIN ASPART 100 UNIT/ML ~~LOC~~ SOLN
3.0000 [IU] | Freq: Three times a day (TID) | SUBCUTANEOUS | Status: DC
  Administered 2012-06-12 – 2012-06-13 (×3): 3 [IU] via SUBCUTANEOUS

## 2012-06-12 MED ORDER — PHENYTOIN SODIUM EXTENDED 100 MG PO CAPS
200.0000 mg | ORAL_CAPSULE | Freq: Every day | ORAL | Status: DC
  Administered 2012-06-12 – 2012-06-14 (×3): 200 mg via ORAL
  Filled 2012-06-12 (×4): qty 2

## 2012-06-12 MED ORDER — INSULIN GLARGINE 100 UNIT/ML ~~LOC~~ SOLN
10.0000 [IU] | Freq: Once | SUBCUTANEOUS | Status: AC
  Administered 2012-06-12: 10 [IU] via SUBCUTANEOUS
  Filled 2012-06-12: qty 0.1

## 2012-06-12 MED ORDER — OXYCODONE HCL 5 MG PO TABS
5.0000 mg | ORAL_TABLET | Freq: Four times a day (QID) | ORAL | Status: DC | PRN
  Administered 2012-06-12: 5 mg via ORAL
  Filled 2012-06-12: qty 1

## 2012-06-12 MED ORDER — OXYCODONE HCL 5 MG PO TABS
5.0000 mg | ORAL_TABLET | Freq: Four times a day (QID) | ORAL | Status: DC | PRN
  Administered 2012-06-12 – 2012-06-15 (×7): 5 mg via ORAL
  Filled 2012-06-12 (×8): qty 1

## 2012-06-12 MED ORDER — INSULIN GLARGINE 100 UNIT/ML ~~LOC~~ SOLN
34.0000 [IU] | Freq: Every day | SUBCUTANEOUS | Status: DC
  Administered 2012-06-13: 34 [IU] via SUBCUTANEOUS
  Filled 2012-06-12: qty 0.34

## 2012-06-12 MED ORDER — THIAMINE HCL 100 MG/ML IJ SOLN
100.0000 mg | Freq: Every day | INTRAMUSCULAR | Status: DC
  Filled 2012-06-12 (×4): qty 1

## 2012-06-12 MED ORDER — MIRTAZAPINE 15 MG PO TABS
15.0000 mg | ORAL_TABLET | Freq: Every day | ORAL | Status: DC
  Administered 2012-06-12 – 2012-06-14 (×3): 15 mg via ORAL
  Filled 2012-06-12 (×4): qty 1

## 2012-06-12 MED ORDER — HYDROCODONE-ACETAMINOPHEN 5-325 MG PO TABS: 1.0000 | ORAL_TABLET | Freq: Four times a day (QID) | ORAL | Status: DC | PRN

## 2012-06-12 MED ORDER — INSULIN ASPART 100 UNIT/ML ~~LOC~~ SOLN
0.0000 [IU] | Freq: Every day | SUBCUTANEOUS | Status: DC
  Administered 2012-06-12 – 2012-06-13 (×2): 3 [IU] via SUBCUTANEOUS

## 2012-06-12 MED ORDER — INSULIN ASPART 100 UNIT/ML ~~LOC~~ SOLN
0.0000 [IU] | SUBCUTANEOUS | Status: DC
  Administered 2012-06-12: 2 [IU] via SUBCUTANEOUS
  Administered 2012-06-12: 3 [IU] via SUBCUTANEOUS

## 2012-06-12 NOTE — Progress Notes (Signed)
CARE MANAGEMENT NOTE 06/12/2012  Patient:  Gabriel Rush, Gabriel Rush   Account Number:  [id number]  Date Initiated:  06/12/2012  Documentation initiated by:  DAVIS,RHONDA  Subjective/Objective Assessment:   pt with hx of diabetes, not tyaking his insulin admitted with nausea, vominting and diarrhea glucose great than 600     Action/Plan:   homeless, history of etoh and substance abuse.   Anticipated DC Date:  06/13/2012   Anticipated DC Plan:  HOME/SELF CARE  In-house referral  Clinical Social Worker  Artist      DC Planning Services  CM consult  Medication Assistance      PAC Choice  NA   Choice offered to / List presented to:  NA   DME arranged  NA      DME agency  NA     HH arranged  NA      HH agency  NA   Status of service:  In process, will continue to follow Medicare Important Message given?  NA - LOS <3 / Initial given by admissions (If response is "NO", the following Medicare IM given date fields will be blank) Date Medicare IM given:   Date Additional Medicare IM given:    Discharge Disposition:    Per UR Regulation:  Reviewed for med. necessity/level of care/duration of stay  If discussed at Long Length of Stay Meetings, dates discussed:    Comments:  [id number]/Rhonda Earlene Plater, RN, [name], CCM:  CHART REVIEWED AND UPDATED. patient will require med assistance with the match program for insulin at time of discharge.  Next chart review due on [id number]. CASE MANAGEMENT [phone]

## 2012-06-12 NOTE — Progress Notes (Signed)
Inpatient Diabetes Program Recommendations  AACE/ADA: New Consensus Statement on Inpatient Glycemic Control (2013)  Target Ranges:  Prepandial:   less than 140 mg/dL      Peak postprandial:   less than 180 mg/dL (1-2 hours)      Critically ill patients:  140 - 180 mg/dL   Reason for Visit: Note patient readmitted with DKA.  Patient was discharged on May 31, 2012 and received the [facility] program to get his medications.  Patient reports that he left the medications at his "roomates" house and she was angry and destroyed it.  Note patient is on very expensive regimen and he lacks the resources to obtain insulin due to lack of insurance.  Would benefit from more affordable insulin regimen such as Humulin 70/30 15 units bid.  Consider starting 70/30 with supper this evening so that titration can be done while in the hospital.  He states that he can go to [place] and purchase generic insulin for 24.88$ per vial.  Based on last admission notes, patient has many social issues that are preventing him from being able to care for self including homelessness, ETOH and drug abuse.  Placed referral for social work and case management consult.  Will follow.  Discussed with patient's RN.

## 2012-06-12 NOTE — Progress Notes (Signed)
[facility] PROGRESS NOTE  Judea Riches ZOX:[id number] DOB: [birth year] DOA: 06/11/2012 PCP: No PCP Per Patient  Brief narrative 52 year old male with history of DM 2, HTN, chronic pancreatitis, ongoing alcohol and polysubstance abuse, recent hospitalization for similar complaints, presented again to the ED on 06/11/12 with complaints of nausea, vomiting, diarrhea and abdominal pain. In the ED, pH on venous blood gas 7.393, sodium 129, bicarbonate 22 and blood sugar 639. Hospitalist admission was requested. Patient was admitted to step down unit.  Assessment/Plan: 1. DM hyperosmolarity type 2, uncontrolled: Does not appear that patient was truly in DKA. Admitted to step down unit. Was hydrated with IV fluids and insulin drip. Overnight, patient was transitioned to Lantus. Patient not compliant with home medications. Counseled regarding compliance. For today, will give him slightly less than his usual dose of home Lantus until by mouth intake has improved, continue SSI and add low dose mealtime NovoLog. In a.m., patient will be advanced to home dose of Lantus and NovoLog mealtime. Hemoglobin A1c: 14.3 suggesting very poor OP control and TSH: 0.360. 2. Nausea, vomiting, abdominal pain and diarrhea:? Diabetic gastroparesis secondary to problem #1. Seem to have resolved. Patient seen on eating something. Start with clear liquids and advance diet as tolerated. Other DD are acute on chronic pancreatitis, alcoholic gastritis and PUD. DC NSAIDs. Continue PPIs. 3. Dehydration: Secondary to problem #1. Continue IV fluids for additional 24 hours. 4. Hypokalemia: Replete and follow BMP in a.m. 5. Alcohol dependence: Patient volunteered to drinking a liter of vodka daily-last episode was approximately 24 hours. High risk for alcohol withdrawal/DTs. Ativan protocol. Continued telemetry. Blood alcohol level less than 11 on admission. Abstinence counseled. 6. Substance abuse: THC and cocaine. Patient denies  cocaine use but was positive on UDS on 4/21. Cessation counseled. 7. History of seizures: Seems to be noncompliant with Dilantin as well. Continue Dilantin for now.? Consider switching to oral Keppra given history of noncompliance. Apparently no recent seizures. 8. Hypertension: Currently normotensive. Consider resuming blood pressure medications in a.m. 9. History of chronic pancreatitis, likely alcohol-related: Management as above.  Code Status: Full Family Communication: Discussed with patient Disposition Plan: Medically stable to transfer to telemetry.   Consultants:  None  Procedures:  None  Antibiotics:  None   HPI/Subjective: Denies nausea, vomiting, abdominal pain or diarrhea. Eager to try to eat something. No recent seizures.  Objective: Filed Vitals:   06/11/12 2121 06/11/12 2200 06/12/12 0000 06/12/12 0400  BP: 121/67 126/88  128/97  Pulse: 77 80  77  Temp:   98.1 F (36.7 C) 98 F (36.7 C)  TempSrc:   Oral Oral  Resp: 17 14  15   Height: 5\' 11"  (1.803 m)     Weight: 75.2 kg (165 lb 12.6 oz)     SpO2: 100% 100%  100%    Intake/Output Summary (Last 24 hours) at 06/12/12 0719 Last data filed at 06/12/12 0600  Gross per 24 hour  Intake 2838.7 ml  Output   1025 ml  Net 1813.7 ml   Filed Weights   06/11/12 1826 06/11/12 2121  Weight: 72 kg (158 lb 11.7 oz) 75.2 kg (165 lb 12.6 oz)    Exam:   General exam: Comfortable.  Respiratory system: Clear. No increased work of breathing.  Cardiovascular system: S1 & S2 heard, RRR. No JVD, murmurs, gallops, clicks or pedal edema.  Gastrointestinal system: Abdomen is nondistended, soft. Diffuse mild tenderness but without rigidity, guarding or rebound. Normal bowel sounds heard.  Central nervous system: Alert and  oriented. No focal neurological deficits.  Extremities: Symmetric 5 x 5 power.   Data Reviewed: Basic Metabolic Panel:  Recent Labs Lab 06/08/12 0154 06/11/12 1515 06/11/12 2140  NA 128*  129* 138  K 4.4 3.5 3.1*  CL 86* 90* 104  CO2 21 22 27   GLUCOSE 754* 639* 228*  BUN 9 9 6   CREATININE 0.87 0.82 0.60  CALCIUM 9.4 9.3 8.4   Liver Function Tests:  Recent Labs Lab 06/08/12 0154  AST 50*  ALT 44  ALKPHOS 120*  BILITOT 0.3  PROT 8.0  ALBUMIN 3.9    Recent Labs Lab 06/08/12 0154 06/11/12 1515  LIPASE 15 14   No results found for this basename: AMMONIA,  in the last 168 hours CBC:  Recent Labs Lab 06/08/12 0154 06/11/12 1515  WBC 4.1 5.7  NEUTROABS 2.9 3.8  HGB 13.7 12.8*  HCT 36.8* 34.5*  MCV 85.2 83.7  PLT 245 222   Cardiac Enzymes:  Recent Labs Lab 06/11/12 2140 06/12/12 0326  TROPONINI <0.30 <0.30   BNP (last 3 results) No results found for this basename: PROBNP,  in the last 8760 hours CBG:  Recent Labs Lab 06/11/12 2134 06/11/12 2241 06/11/12 2344 06/12/12 0042 06/12/12 0138  GLUCAP 248* 175* 141* 133* 127*    Recent Results (from the past 240 hour(s))  MRSA PCR SCREENING     Status: None   Collection Time    06/11/12  9:22 PM      Result Value Range Status   MRSA by PCR NEGATIVE  NEGATIVE Final   Comment:            The GeneXpert MRSA Assay (FDA     approved for NASAL specimens     only), is one component of a     comprehensive MRSA colonization     surveillance program. It is not     intended to diagnose MRSA     infection nor to guide or     monitor treatment for     MRSA infections.     Studies: No results found.   Additional labs:   Scheduled Meds: . aspirin EC  81 mg Oral Daily  . enoxaparin (LOVENOX) injection  40 mg Subcutaneous Q24H  . insulin aspart  0-9 Units Subcutaneous Q4H  . insulin glargine  12 Units Subcutaneous Daily  . insulin regular  0-10 Units Intravenous TID WC  . pantoprazole (PROTONIX) IV  40 mg Intravenous Q12H  . sodium chloride  3 mL Intravenous Q12H   Continuous Infusions: . sodium chloride 125 mL/hr at 06/12/12 0403  . insulin (NOVOLIN-R) infusion Stopped (06/12/12 0300)     Active Problems:   * No active hospital problems. *    Time spent: 45 minutes    [facility]  [facility] Pager [phone].   If 8PM-8AM, please contact night-coverage at [email or web address], password [facility] 06/12/2012, 7:19 AM  LOS: 1 day

## 2012-06-12 NOTE — Progress Notes (Signed)
INITIAL NUTRITION ASSESSMENT  DOCUMENTATION CODES Per approved criteria  -Not Applicable   INTERVENTION: Provide Glucerna shakes BID Provided brief diabetes diet education Pt receiving Multivitamin with minerals, Folvite, and thiamine daily  NUTRITION DIAGNOSIS: Inadequate oral intake related to poor appetite as evidenced by 20% wt loss in < 3 months per pt report.   Goal: Pt to meet >/= 90% of their estimated nutrition needs  Monitor:  PO intake Wt Labs  Reason for Assessment: MST  52 y.o. male  Admitting Dx: DKA  ASSESSMENT: 52 y.o. male with known history of diabetes mellitus type 2, hypertension and chronic pancreatitis with polysubstance abuse including alcohol and cocaine presented to the ER because of nausea, vomiting and abdominal pain. Patient was recently discharged on April 24 when he was admitted for similar complaints.   Pt reports that he has had a decreased appetite for a couple of months. Pt's usual body weight is 219 lbs; pt remembers weighing 205 lbs a couple months ago. Pt reports that he is feeling better today and consumed 100% of breakfast on the clear liquid diet and 100% of lunch on the carb modified diet. Pt states he usually eats 1 big meal daily and then snacks on junk food; he primarily drinks sprite, beer, and water. Pt states that he checks his blood sugar 4 times daily and they tend to run high. Emphasized the importance of controlled and consistent carbohydrate intake throughout the day; encouraged 2-3 meals with snacks daily. Encouraged pt to decrease intake of sprite and beer and to drink more water or diet beverages instead. Discussed healthy snack ideas that include lean protein, whole grains, fruits, and vegetables and discouraged junk food that contains sugar. Pt states that he will try to cut back on his soda intake and will try to eat healthier snacks. Pt also reports that he walks daily and will continue this healthy habit.   Height: Ht  Readings from Last 1 Encounters:  06/11/12 5\' 11"  (1.803 m)    Weight: Wt Readings from Last 1 Encounters:  06/11/12 165 lb 12.6 oz (75.2 kg)    Ideal Body Weight: 172 lbs  % Ideal Body Weight: 96%  Wt Readings from Last 10 Encounters:  06/11/12 165 lb 12.6 oz (75.2 kg)  05/29/12 158 lb 11.7 oz (72 kg)  10/23/11 205 lb 7.5 oz (93.2 kg)  06/04/11 218 lb 7.6 oz (99.1 kg)  05/20/11 219 lb (99.338 kg)  03/16/11 211 lb 3.2 oz (95.8 kg)  02/09/11 210 lb (95.255 kg)  01/16/11 225 lb (102.059 kg)  12/22/10 219 lb (99.338 kg)  12/29/09 215 lb (97.523 kg)    Usual Body Weight: 219 lbs  % Usual Body Weight: 82%  BMI:  Body mass index is 23.13 kg/(m^2).  Estimated Nutritional Needs: Kcal: 4540-9811 Protein: 75-90 grams Fluid: 2.2-2.5 L  Skin: intact; non-pitting RLE edema  Diet Order: Carb Control  EDUCATION NEEDS: -Education needs addressed   Intake/Output Summary (Last 24 hours) at 06/12/12 1527 Last data filed at 06/12/12 1400  Gross per 24 hour  Intake 4558.7 ml  Output   1025 ml  Net 3533.7 ml    Last BM: 5/6  Labs:   Recent Labs Lab 06/11/12 1515 06/11/12 2140 06/12/12 0340  NA 129* 138 139  K 3.5 3.1* 3.1*  CL 90* 104 106  CO2 22 27 24   BUN 9 6 6   CREATININE 0.82 0.60 0.52  CALCIUM 9.3 8.4 8.4  GLUCOSE 639* 228* 138*   Lab Results  Component Value Date   HGBA1C 14.3* 06/11/2012    CBG (last 3)   Recent Labs  06/12/12 0356 06/12/12 0834 06/12/12 1211  GLUCAP 152* 211* 219*    Scheduled Meds: . aspirin EC  81 mg Oral Daily  . enoxaparin (LOVENOX) injection  40 mg Subcutaneous Q24H  . folic acid  1 mg Oral Daily  . gabapentin  300 mg Oral TID  . insulin aspart  0-5 Units Subcutaneous QHS  . insulin aspart  0-9 Units Subcutaneous TID WC  . insulin aspart  3 Units Subcutaneous TID WC  . insulin glargine  10 Units Subcutaneous Once  . [START ON 06/13/2012] insulin glargine  34 Units Subcutaneous Daily  . mirtazapine  15 mg Oral QHS  .  multivitamin with minerals  1 tablet Oral Daily  . pantoprazole (PROTONIX) IV  40 mg Intravenous Q12H  . phenytoin  200 mg Oral QHS  . potassium chloride  40 mEq Oral Once  . sodium chloride  3 mL Intravenous Q12H  . thiamine  100 mg Oral Daily   Or  . thiamine  100 mg Intravenous Daily    Continuous Infusions: . sodium chloride 125 mL/hr at 06/12/12 1254    Past Medical History  Diagnosis Date  . Diabetes mellitus   . Hypertension   . Seizures   . Pancreatitis   . Alcohol abuse   . Chronic abdominal pain   . Alcoholism /alcohol abuse   . Hepatitis     Past Surgical History  Procedure Laterality Date  . Appendectomy      Ian Malkin RD, LDN Inpatient Clinical Dietitian Pager: [phone] After Hours Pager: [phone]

## 2012-06-13 DIAGNOSIS — F1410 Cocaine abuse, uncomplicated: Secondary | ICD-10-CM

## 2012-06-13 DIAGNOSIS — Z9119 Patient's noncompliance with other medical treatment and regimen: Secondary | ICD-10-CM

## 2012-06-13 DIAGNOSIS — R109 Unspecified abdominal pain: Secondary | ICD-10-CM

## 2012-06-13 LAB — GLUCOSE, CAPILLARY
Glucose-Capillary: 265 mg/dL — ABNORMAL HIGH (ref 70–99)
Glucose-Capillary: 260 mg/dL — ABNORMAL HIGH (ref 70–99)
Glucose-Capillary: 264 mg/dL — ABNORMAL HIGH (ref 70–99)

## 2012-06-13 LAB — BASIC METABOLIC PANEL
CO2: 23 mEq/L (ref 19–32)
Chloride: 104 mEq/L (ref 96–112)
Potassium: 3.3 mEq/L — ABNORMAL LOW (ref 3.5–5.1)
Sodium: 134 mEq/L — ABNORMAL LOW (ref 135–145)

## 2012-06-13 LAB — HEPATIC FUNCTION PANEL: Bilirubin, Direct: <0.1 mg/dL (ref 0.0–0.3)

## 2012-06-13 LAB — LIPASE, BLOOD: Lipase: 10 U/L — ABNORMAL LOW (ref 11–59)

## 2012-06-13 MED ORDER — PANTOPRAZOLE SODIUM 40 MG PO TBEC
40.0000 mg | DELAYED_RELEASE_TABLET | Freq: Two times a day (BID) | ORAL | Status: DC
  Administered 2012-06-13 – 2012-06-15 (×4): 40 mg via ORAL
  Filled 2012-06-13 (×7): qty 1

## 2012-06-13 MED ORDER — POTASSIUM CHLORIDE CRYS ER 20 MEQ PO TBCR
40.0000 meq | EXTENDED_RELEASE_TABLET | Freq: Once | ORAL | Status: AC
  Administered 2012-06-13: 40 meq via ORAL
  Filled 2012-06-13: qty 2

## 2012-06-13 MED ORDER — INSULIN NPH (HUMAN) (ISOPHANE) 100 UNIT/ML ~~LOC~~ SUSP
15.0000 [IU] | Freq: Two times a day (BID) | SUBCUTANEOUS | Status: DC
  Administered 2012-06-13 – 2012-06-15 (×4): 15 [IU] via SUBCUTANEOUS
  Filled 2012-06-13: qty 10

## 2012-06-13 MED ORDER — LOPERAMIDE HCL 2 MG PO CAPS
2.0000 mg | ORAL_CAPSULE | Freq: Once | ORAL | Status: AC
  Administered 2012-06-13: 2 mg via ORAL
  Filled 2012-06-13: qty 1

## 2012-06-13 NOTE — ED Provider Notes (Signed)
Medical screening examination/treatment/procedure(s) were conducted as a shared visit with non-physician practitioner(s) and myself.  I personally evaluated the patient during the encounter.  51yM with hyperglycemia. NOT in DKA. Normal bicarb. No acidosis on blood gas. No ketonuria. Pt is significantly hyperglycemic though and was seen in ED 3 days ago for the same. He is currently w/o his medications and at high risk for return. I think most prudent to admit at this time.   Raeford Razor, MD 06/13/12 [phone]

## 2012-06-13 NOTE — Progress Notes (Signed)
PHARMACIST - PHYSICIAN COMMUNICATION DR:  Rito Ehrlich CONCERNING: Proton Pump Inhibitor IV to Oral Route Change Policy  The patient is receiving Protonix by the intravenous route. Based on criteria approved by the Pharmacy and Therapeutics Committee and the Medical Executive Committee, the medication is being converted to the equivalent oral dose form.   These criteria include:  -No Active GI bleeding  -Able to tolerate diet of full liquids (or better) or tube feeding  -Able to tolerate other medications by the oral or enteral route   If you have any questions about this conversion, please contact the Pharmacy Department (ext [phone]). Thank you.  Loralee Pacas, PharmD, BCPS 06/13/2012 1:17 PM

## 2012-06-13 NOTE — Progress Notes (Addendum)
Inpatient Diabetes Program Recommendations  AACE/ADA: New Consensus Statement on Inpatient Glycemic Control (2013)  Target Ranges:  Prepandial:   less than 140 mg/dL      Peak postprandial:   less than 180 mg/dL (1-2 hours)      Critically ill patients:  140 - 180 mg/dL   Reason for Visit: Results for Gabriel Rush, Gabriel Rush (MRN [id number]) as of 06/13/2012 15:39  Ref. Range 06/12/2012 12:11 06/12/2012 16:39 06/12/2012 21:26 06/13/2012 07:09 06/13/2012 11:16  Glucose-Capillary Latest Range: 70-99 mg/dL 528 (H) 413 (H) 244 (H) 265 (H) 260 (H)   Spoke briefly with patient regarding NPH regimen (bid).  Told him that NPH is 24.88$ per vial.  Will need to follow-up at [facility] after discharge.  Also will need to obtain generic meter to check CBG's. Patient appreciative.  Discussed with social work.  Addendum:  According to last admission notes, patient will not be eligible for the [facility] program due to recent use.

## 2012-06-13 NOTE — Progress Notes (Signed)
Patient complained of several loose bowel movements and requesting something for diarrhea.  Dr. Rito Ehrlich paged

## 2012-06-13 NOTE — Progress Notes (Signed)
[facility] PROGRESS NOTE  Gabriel Rush ZOX:[id number] DOB: [birth year] DOA: 06/11/2012  PCP: No PCP  Brief HPI: 52 year old male with history of DM 2, HTN, chronic pancreatitis, ongoing alcohol and polysubstance abuse, recent hospitalization for similar complaints, presented again to the ED on 06/11/12 with complaints of nausea, vomiting, diarrhea and abdominal pain. In the ED, pH on venous blood gas 7.393, sodium 129, bicarbonate 22 and blood sugar 639. Hospitalist admission was requested. Patient was admitted to step down unit.    Past medical history:  Past Medical History  Diagnosis Date  . Diabetes mellitus   . Hypertension   . Seizures   . Pancreatitis   . Alcohol abuse   . Chronic abdominal pain   . Alcoholism /alcohol abuse   . Hepatitis     Consultants: None  Procedures: None  Antibiotics: None  Subjective: Patient complaints of pain in upper abdomen. Better than what it was. 6/10. Tolerating diet.   Objective: Vital Signs  Filed Vitals:   06/12/12 1457 06/12/12 2129 06/13/12 0551 06/13/12 1411  BP: 113/77 118/85 118/83 115/80  Pulse: 72 80 70 75  Temp: 97.6 F (36.4 C) 97.6 F (36.4 C) 97.6 F (36.4 C) 98.3 F (36.8 C)  TempSrc: Oral Oral Oral Oral  Resp: 18 18 18 20   Height:      Weight:      SpO2: 100% 98% 100% 100%    Filed Weights   06/11/12 1826 06/11/12 2121  Weight: 72 kg (158 lb 11.7 oz) 75.2 kg (165 lb 12.6 oz)    Intake/Output from previous day: 05/06 0701 - 05/07 0700 In: 3835 [P.O.:960; I.V.:2875] Out: 1400 [Urine:1400]  General appearance: alert, cooperative, appears stated age and no distress Head: Normocephalic, without obvious abnormality, atraumatic Resp: clear to auscultation bilaterally Cardio: regular rate and rhythm, S1, S2 normal, no murmur, click, rub or gallop GI: Soft, tender in epigastric area, no rebound rigidity or guarding.BS present Neurologic: Alert and oriented X 3, normal strength and tone. Normal  symmetric reflexes. Normal coordination and gait  Lab Results:  Basic Metabolic Panel:  Recent Labs Lab 06/08/12 0154 06/11/12 1515 06/11/12 2140 06/12/12 0340 06/13/12 0510  NA 128* 129* 138 139 134*  K 4.4 3.5 3.1* 3.1* 3.3*  CL 86* 90* 104 106 104  CO2 21 22 27 24 23   GLUCOSE 754* 639* 228* 138* 240*  BUN 9 9 6 6  4*  CREATININE 0.87 0.82 0.60 0.52 0.56  CALCIUM 9.4 9.3 8.4 8.4 8.0*   Liver Function Tests:  Recent Labs Lab 06/08/12 0154  AST 50*  ALT 44  ALKPHOS 120*  BILITOT 0.3  PROT 8.0  ALBUMIN 3.9    Recent Labs Lab 06/08/12 0154 06/11/12 1515  LIPASE 15 14   No results found for this basename: AMMONIA,  in the last 168 hours CBC:  Recent Labs Lab 06/08/12 0154 06/11/12 1515  WBC 4.1 5.7  NEUTROABS 2.9 3.8  HGB 13.7 12.8*  HCT 36.8* 34.5*  MCV 85.2 83.7  PLT 245 222   Cardiac Enzymes:  Recent Labs Lab 06/11/12 2140 06/12/12 0326  TROPONINI <0.30 <0.30   BNP (last 3 results) No results found for this basename: PROBNP,  in the last 8760 hours CBG:  Recent Labs Lab 06/12/12 1211 06/12/12 1639 06/12/12 2126 06/13/12 0709 06/13/12 1116  GLUCAP 219* 330* 252* 265* 260*    Recent Results (from the past 240 hour(s))  MRSA PCR SCREENING     Status: None   Collection Time  06/11/12  9:22 PM      Result Value Range Status   MRSA by PCR NEGATIVE  NEGATIVE Final   Comment:            The GeneXpert MRSA Assay (FDA     approved for NASAL specimens     only), is one component of a     comprehensive MRSA colonization     surveillance program. It is not     intended to diagnose MRSA     infection nor to guide or     monitor treatment for     MRSA infections.      Studies/Results: No results found.  Medications:  Scheduled: . aspirin EC  81 mg Oral Daily  . enoxaparin (LOVENOX) injection  40 mg Subcutaneous Q24H  . feeding supplement  237 mL Oral BID BM  . folic acid  1 mg Oral Daily  . gabapentin  300 mg Oral TID  .  insulin aspart  0-5 Units Subcutaneous QHS  . insulin aspart  0-9 Units Subcutaneous TID WC  . insulin NPH  15 Units Subcutaneous BID AC  . mirtazapine  15 mg Oral QHS  . multivitamin with minerals  1 tablet Oral Daily  . pantoprazole  40 mg Oral BID AC  . phenytoin  200 mg Oral QHS  . sodium chloride  3 mL Intravenous Q12H  . thiamine  100 mg Oral Daily   Or  . thiamine  100 mg Intravenous Daily   Continuous: . sodium chloride 125 mL/hr at 06/13/12 1432   ZOX:[id number], dextrose, HYDROmorphone (DILAUDID) injection, LORazepam, LORazepam, ondansetron (ZOFRAN) IV, ondansetron, oxyCODONE  Assessment/Plan:  Active Problems:   SEIZURE DISORDER   Chronic pancreatitis   Hypokalemia   Noncompliance with treatment   DM hyperosmolarity type II, uncontrolled   Dehydration   Nausea vomiting and diarrhea    DM hyperosmolarity type 2, uncontrolled  Does not appear that patient was truly in DKA. Was initially admitted to step down unit. Was hydrated with IV fluids and insulin drip. Overnight, patient was transitioned to Lantus. Patient not compliant with home medications. Counseled regarding compliance. Due to financial reason we will switch him to NPH insulin. Discussed with patient and he is agreeable. Hemoglobin A1c: 14.3 suggesting very poor OP control and TSH: 0.360.  Nausea, vomiting, abdominal pain and diarrhea ? Diabetic gastroparesis secondary to problem #1 vs due to chronic pancreatitis and pseudocyst. Pain improved but persists. Will check LFT and Lipase. Tolerating diet. Other DD are acute on chronic pancreatitis, alcoholic gastritis and PUD. DC NSAIDs. Continue PPIs.  Dehydration  Secondary to problem #1. Improved with IVF.  Hypokalemia  Replete and follow BMP in a.m.  Alcohol dependence  Patient volunteered to drinking a liter of vodka daily-last episode was approximately 24 hours prior to admission. High risk for alcohol withdrawal/DTs. Ativan protocol. Continue  telemetry. Blood alcohol level less than 11 on admission. Abstinence counseled.  Substance abuse  THC and cocaine. Patient denies cocaine use but was positive on UDS on 4/21. Cessation counseled.  History of seizures  Seems to be noncompliant with Dilantin as well. Continue Dilantin for now. Last seizure was 1 month ago when he off Dilantin.  Hypertension  Currently normotensive. Monitor.  History of chronic pancreatitis, likely alcohol-related  Management as above.  DVT Prophylaxis:  Enoxaparin Code Status: Full  Family Communication: Discussed with patient  Disposition Plan: He is homeless. SW to see. Anticipate discharge 5/8 if CBG's remain stable on new regimen.  LOS: 2 days   [facility]  [facility] Pager [phone] 06/13/2012, 2:45 PM  If 8PM-8AM, please contact night-coverage at [email or web address], password [facility]

## 2012-06-14 DIAGNOSIS — K862 Cyst of pancreas: Secondary | ICD-10-CM

## 2012-06-14 LAB — CBC
HCT: 33.8 % — ABNORMAL LOW (ref 39.0–52.0)
Hemoglobin: 11.8 g/dL — ABNORMAL LOW (ref 13.0–17.0)
MCH: 29.9 pg (ref 26.0–34.0)
MCHC: 34.9 g/dL (ref 30.0–36.0)
RBC: 3.95 MIL/uL — ABNORMAL LOW (ref 4.22–5.81)

## 2012-06-14 LAB — GLUCOSE, CAPILLARY
Glucose-Capillary: 172 mg/dL — ABNORMAL HIGH (ref 70–99)
Glucose-Capillary: 223 mg/dL — ABNORMAL HIGH (ref 70–99)

## 2012-06-14 LAB — COMPREHENSIVE METABOLIC PANEL
Alkaline Phosphatase: 83 U/L (ref 39–117)
BUN: 3 mg/dL — ABNORMAL LOW (ref 6–23)
CO2: 26 mEq/L (ref 19–32)
GFR calc Af Amer: >90 mL/min (ref >90)
GFR calc non Af Amer: >90 mL/min (ref >90)
Glucose, Bld: 128 mg/dL — ABNORMAL HIGH (ref 70–99)
Potassium: 3.5 mEq/L (ref 3.5–5.1)
Total Protein: 5.9 g/dL — ABNORMAL LOW (ref 6.0–8.3)

## 2012-06-14 MED ORDER — POTASSIUM CHLORIDE CRYS ER 20 MEQ PO TBCR
40.0000 meq | EXTENDED_RELEASE_TABLET | Freq: Once | ORAL | Status: AC
  Administered 2012-06-14: 40 meq via ORAL
  Filled 2012-06-14: qty 2

## 2012-06-14 NOTE — Progress Notes (Signed)
Clinical Social Work Department BRIEF PSYCHOSOCIAL ASSESSMENT 06/14/2012  Patient:  Gabriel Rush, Gabriel Rush     Account Number:  [id number]     Admit date:  06/11/2012  Clinical Social Worker:  Hattie Perch  Date/Time:  06/14/2012 12:00 M  Referred by:  Physician  Date Referred:  06/14/2012 Referred for  Homelessness   Other Referral:   Interview type:  Patient Other interview type:    PSYCHOSOCIAL DATA Living Status:  ALONE Admitted from facility:   Level of care:   Primary support name:  Ferne Reus Primary support relationship to patient:  PARENT Degree of support available:   poor    CURRENT CONCERNS Current Concerns  Post-Acute Placement   Other Concerns:    SOCIAL WORK ASSESSMENT / PLAN CSW met with patient. patient is alert and oriented X3. patient is homeless and suffers from chronic substance abuse issues. patient is an alcoholic and is currently detoxed. patient denies interest in substance abuse treatment. patient states that he has previously been at [facility] and that he will go back there but that he really doesnt want to because he thinks it is a bad environment and knows he cant be on the streets with his health problems.   Assessment/plan status:   Other assessment/ plan:   Information/referral to community resources:    PATIENT'S/FAMILY'S RESPONSE TO PLAN OF CARE: patient reluctantly agrees to go back to [facility]. he will need a bus pass when medically stable to get his meds and get to the homeless shelter.

## 2012-06-14 NOTE — Progress Notes (Signed)
[facility] PROGRESS NOTE  Cecilia Nishikawa YNW:[id number] DOB: 12/18/1960 DOA: 06/11/2012  PCP: No PCP  Brief HPI: 52 year old male with history of DM 2, HTN, chronic pancreatitis, ongoing alcohol and polysubstance abuse, recent hospitalization for similar complaints, presented again to the ED on 06/11/12 with complaints of nausea, vomiting, diarrhea and abdominal pain. In the ED, pH on venous blood gas 7.393, sodium 129, bicarbonate 22 and blood sugar 639. Hospitalist admission was requested. Patient was admitted to step down unit. He was transitioned to SQ insulin and was transferred to floor.    Past medical history:  Past Medical History  Diagnosis Date  . Diabetes mellitus   . Hypertension   . Seizures   . Pancreatitis   . Alcohol abuse   . Chronic abdominal pain   . Alcoholism /alcohol abuse   . Hepatitis     Consultants: None  Procedures: None  Antibiotics: None  Subjective: Patient with profuse diarrhea, 7 overnight and 3 this AM. Watery, no blood. Imodium given last night did not help at all. Diarrhea is unusual for him per patient. Also with abdominal pain radiating to back which is chronic. Tolerating diet.   Objective: Vital Signs  Filed Vitals:   06/13/12 0551 06/13/12 1411 06/13/12 2210 06/14/12 0550  BP: 118/83 115/80 131/93 125/89  Pulse: 70 75 78 80  Temp: 97.6 F (36.4 C) 98.3 F (36.8 C) 98.1 F (36.7 C) 98 F (36.7 C)  TempSrc: Oral Oral Oral Oral  Resp: 18 20 18 18   Height:      Weight:      SpO2: 100% 100% 99% 99%    Filed Weights   06/11/12 1826 06/11/12 2121  Weight: 72 kg (158 lb 11.7 oz) 75.2 kg (165 lb 12.6 oz)    Intake/Output from previous day: 05/07 0701 - 05/08 0700 In: 3835 [P.O.:960; I.V.:2875] Out: 2650 [Urine:2650]  General appearance: alert, cooperative, appears stated age and no distress Head: Normocephalic, without obvious abnormality, atraumatic Resp: clear to auscultation bilaterally Cardio: regular rate and  rhythm, S1, S2 normal, no murmur, click, rub or gallop GI: Soft, tender in epigastric area, no rebound rigidity or guarding. BS present Neurologic: Alert and oriented X 3, normal strength and tone. Normal symmetric reflexes. Normal coordination and gait  Lab Results:  Basic Metabolic Panel:  Recent Labs Lab 06/11/12 1515 06/11/12 2140 06/12/12 0340 06/13/12 0510 06/14/12 0515  NA 129* 138 139 134* 138  K 3.5 3.1* 3.1* 3.3* 3.5  CL 90* 104 106 104 105  CO2 22 27 24 23 26   GLUCOSE 639* 228* 138* 240* 128*  BUN 9 6 6  4* 3*  CREATININE 0.82 0.60 0.52 0.56 0.58  CALCIUM 9.3 8.4 8.4 8.0* 8.4   Liver Function Tests:  Recent Labs Lab 06/08/12 0154 06/13/12 1500 06/14/12 0515  AST 50* 41* 57*  ALT 44 32 57*  ALKPHOS 120* 70 83  BILITOT 0.3 0.3 0.2*  PROT 8.0 5.0* 5.9*  ALBUMIN 3.9 2.5* 2.8*    Recent Labs Lab 06/08/12 0154 06/11/12 1515 06/13/12 1500  LIPASE 15 14 10*   No results found for this basename: AMMONIA,  in the last 168 hours CBC:  Recent Labs Lab 06/08/12 0154 06/11/12 1515 06/14/12 0515  WBC 4.1 5.7 5.1  NEUTROABS 2.9 3.8  --   HGB 13.7 12.8* 11.8*  HCT 36.8* 34.5* 33.8*  MCV 85.2 83.7 85.6  PLT 245 222 153   Cardiac Enzymes:  Recent Labs Lab 06/11/12 2140 06/12/12 0326  TROPONINI <0.30 <  0.30   BNP (last 3 results) No results found for this basename: PROBNP,  in the last 8760 hours CBG:  Recent Labs Lab 06/13/12 0709 06/13/12 1116 06/13/12 1649 06/13/12 2207 06/14/12 0716  GLUCAP 265* 260* 264* 272* 192*    Recent Results (from the past 240 hour(s))  MRSA PCR SCREENING     Status: None   Collection Time    06/11/12  9:22 PM      Result Value Range Status   MRSA by PCR NEGATIVE  NEGATIVE Final   Comment:            The GeneXpert MRSA Assay (FDA     approved for NASAL specimens     only), is one component of a     comprehensive MRSA colonization     surveillance program. It is not     intended to diagnose MRSA      infection nor to guide or     monitor treatment for     MRSA infections.      Studies/Results: No results found.  Medications:  Scheduled: . aspirin EC  81 mg Oral Daily  . enoxaparin (LOVENOX) injection  40 mg Subcutaneous Q24H  . feeding supplement  237 mL Oral BID BM  . folic acid  1 mg Oral Daily  . gabapentin  300 mg Oral TID  . insulin aspart  0-5 Units Subcutaneous QHS  . insulin aspart  0-9 Units Subcutaneous TID WC  . insulin NPH  15 Units Subcutaneous BID AC  . mirtazapine  15 mg Oral QHS  . multivitamin with minerals  1 tablet Oral Daily  . pantoprazole  40 mg Oral BID AC  . phenytoin  200 mg Oral QHS  . potassium chloride  40 mEq Oral Once  . sodium chloride  3 mL Intravenous Q12H  . thiamine  100 mg Oral Daily   Or  . thiamine  100 mg Intravenous Daily   Continuous: . sodium chloride 125 mL/hr (06/14/12 0913)   [id number], dextrose, HYDROmorphone (DILAUDID) injection, LORazepam, LORazepam, ondansetron (ZOFRAN) IV, ondansetron, oxyCODONE  Assessment/Plan:  Active Problems:   SEIZURE DISORDER   Chronic pancreatitis   Hypokalemia   Noncompliance with treatment   DM hyperosmolarity type II, uncontrolled   Dehydration   Nausea vomiting and diarrhea    DM type 2, uncontrolled  Does not appear that patient was truly in DKA. Was initially admitted to step down unit. Was hydrated with IV fluids and insulin drip. Overnight, patient was transitioned to Lantus. Patient not compliant with home medications. Counseled regarding compliance. Due to financial reason we switched him to NPH insulin. CBG reasonable. Continue to monitor. Hemoglobin A1c: 14.3 suggesting very poor OP control likely due to non compliance.  Nausea, vomiting, abdominal pain and diarrhea ? Diabetic gastroparesis secondary to problem #1 vs due to chronic pancreatitis and pseudocyst. Pain improved but persists. LFT is mildly abnormal but Lipase is normal. Tolerating diet. Other DD are acute  on chronic pancreatitis, alcoholic gastritis and PUD. DC NSAIDs. Continue PPIs. Since diarrhea persists will check C diff. No clear indication to repeat imaging studies. He had a Ct in March. No concerning signs such as fever or remarkably abnormal LFT's.  Dehydration  Secondary to problem #1. Improved with IVF.  Hypokalemia  Repleted  Alcohol dependence  Patient volunteered to drinking a liter of vodka daily-last episode was approximately 24 hours prior to admission. High risk for alcohol withdrawal/DTs though seems stable currently. Ativan protocol. Continue telemetry. Blood  alcohol level less than 11 on admission. Abstinence counseled.  Substance abuse  THC and cocaine. Patient denies cocaine use but was positive on UDS on 4/21. Cessation counseled.  History of seizures  Seems to be noncompliant with Dilantin as well. Continue Dilantin for now. Last seizure was 1 month ago when he off Dilantin.  Hypertension  Currently normotensive. Monitor.  History of chronic pancreatitis, likely alcohol-related  Management as above.  DVT Prophylaxis:  Enoxaparin Code Status: Full  Family Communication: Discussed with patient  Disposition Plan: He is homeless. SW to arrange for transportation to a shelter. Await improvement in diarrhea. Unlikely he can be discharged today.    LOS: 3 days   [facility]  [facility] Pager [phone] 06/14/2012, 9:42 AM  If 8PM-8AM, please contact night-coverage at [email or web address], password [facility]

## 2012-06-14 NOTE — Progress Notes (Signed)
Met with pt at bedside to discuss follow up care post hospitalization. I told him about [facility] and encouraged him to establish care with them once he leaves the hospital.   I also made pt aware that he is not eligible for assistance with medications since he has been assisted twice in 2 months while limit is 1 time in 12 rolling months. Pt verbalized understanding.  Algernon Huxley RN [name]   [phone]

## 2012-06-15 DIAGNOSIS — R569 Unspecified convulsions: Secondary | ICD-10-CM

## 2012-06-15 DIAGNOSIS — K861 Other chronic pancreatitis: Secondary | ICD-10-CM

## 2012-06-15 LAB — COMPREHENSIVE METABOLIC PANEL
ALT: 44 U/L (ref 0–53)
Alkaline Phosphatase: 69 U/L (ref 39–117)
BUN: 3 mg/dL — ABNORMAL LOW (ref 6–23)
CO2: 25 mEq/L (ref 19–32)
Chloride: 107 mEq/L (ref 96–112)
GFR calc Af Amer: >90 mL/min (ref >90)
Glucose, Bld: 166 mg/dL — ABNORMAL HIGH (ref 70–99)
Potassium: 3.6 mEq/L (ref 3.5–5.1)
Sodium: 138 mEq/L (ref 135–145)
Total Bilirubin: 0.2 mg/dL — ABNORMAL LOW (ref 0.3–1.2)

## 2012-06-15 LAB — CBC
HCT: 29.8 % — ABNORMAL LOW (ref 39.0–52.0)
Hemoglobin: 10.4 g/dL — ABNORMAL LOW (ref 13.0–17.0)
MCHC: 34.9 g/dL (ref 30.0–36.0)
RBC: 3.47 MIL/uL — ABNORMAL LOW (ref 4.22–5.81)
WBC: 4.6 10*3/uL (ref 4.0–10.5)

## 2012-06-15 LAB — CLOSTRIDIUM DIFFICILE BY PCR: Toxigenic C. Difficile by PCR: NEGATIVE

## 2012-06-15 LAB — GLUCOSE, CAPILLARY: Glucose-Capillary: 337 mg/dL — ABNORMAL HIGH (ref 70–99)

## 2012-06-15 MED ORDER — LOPERAMIDE HCL 2 MG PO CAPS
4.0000 mg | ORAL_CAPSULE | Freq: Once | ORAL | Status: AC
  Administered 2012-06-15: 4 mg via ORAL
  Filled 2012-06-15: qty 2

## 2012-06-15 MED ORDER — MIRTAZAPINE 15 MG PO TABS: 15.0000 mg | ORAL_TABLET | Freq: Every day | ORAL | Status: DC

## 2012-06-15 MED ORDER — OXYCODONE HCL 5 MG PO TABS: 5.0000 mg | ORAL_TABLET | Freq: Four times a day (QID) | ORAL | Status: DC | PRN

## 2012-06-15 MED ORDER — PHENYTOIN SODIUM EXTENDED 100 MG PO CAPS: 200.0000 mg | ORAL_CAPSULE | Freq: Every day | ORAL | Status: DC

## 2012-06-15 MED ORDER — LOPERAMIDE HCL 2 MG PO CAPS: 4.0000 mg | ORAL_CAPSULE | Freq: Four times a day (QID) | ORAL | Status: DC | PRN

## 2012-06-15 MED ORDER — GABAPENTIN 300 MG PO CAPS: 300.0000 mg | ORAL_CAPSULE | Freq: Three times a day (TID) | ORAL | Status: DC

## 2012-06-15 MED ORDER — INSULIN NPH (HUMAN) (ISOPHANE) 100 UNIT/ML ~~LOC~~ SUSP: 15.0000 [IU] | Freq: Two times a day (BID) | SUBCUTANEOUS | Status: DC

## 2012-06-15 MED ORDER — AMYLASE-LIPASE-PROTEASE 33.2-10-37.5 MU PO CPEP: 2.0000 | ORAL_CAPSULE | Freq: Three times a day (TID) | ORAL | Status: DC

## 2012-06-15 MED ORDER — SIMVASTATIN 10 MG PO TABS: 10.0000 mg | ORAL_TABLET | Freq: Every day | ORAL | Status: DC

## 2012-06-15 MED ORDER — ONDANSETRON 4 MG PO TBDP: 4.0000 mg | ORAL_TABLET | Freq: Three times a day (TID) | ORAL | Status: DC | PRN

## 2012-06-15 NOTE — Discharge Summary (Signed)
[facility]  Physician Discharge Summary   Patient ID: Gabriel Rush MRN: [id number] DOB/AGE: 02-22-1960 52 y.o.  Admit date: 06/11/2012 Discharge date: 06/15/2012  PCP: Does not have a PCP. Is non compliant.  DISCHARGE DIAGNOSES:  Active Problems:   SEIZURE DISORDER   Chronic pancreatitis   Hypokalemia   Noncompliance with treatment   DM hyperosmolarity type II, uncontrolled   Dehydration   Nausea vomiting and diarrhea    DISCHARGE CONDITION: fair  Diet recommendation: Mod Carb  Filed Weights   06/11/12 1826 06/11/12 2121  Weight: 72 kg (158 lb 11.7 oz) 75.2 kg (165 lb 12.6 oz)    INITIAL HISTORY: 52 year old male with history of DM 2, HTN, chronic pancreatitis, ongoing alcohol and polysubstance abuse, recent hospitalization for similar complaints, presented again to the ED on 06/11/12 with complaints of nausea, vomiting, diarrhea and abdominal pain. In the ED, pH on venous blood gas 7.393, sodium 129, bicarbonate 22 and blood sugar 639. Hospitalist admission was requested. Patient was admitted to step down unit. He was transitioned to SQ insulin and was transferred to floor.   Consultations:  None  Procedures:  None  HOSPITAL COURSE:   DM type 2, uncontrolled  Does not appear that patient was truly in DKA. He was initially admitted to step down unit. Was hydrated with IV fluids and started on insulin drip. Once his glucose was at goal he was transitioned to Lantus. Patient not compliant with home medications. Counseled regarding compliance. Due to financial reason we switched him to NPH insulin. CBG reasonable on this new medication. Hemoglobin A1c: 14.3 suggesting very poor OP control likely due to non compliance.   Nausea, vomiting, abdominal pain and diarrhea  Etiology likely due to complications of diabetes and chronic pancreatitis. Nausea and vomiting has subsided. Diarrhea persists. C diff was negative. He will be prescribed Imodium. He may also benefit  from pancreatic enzymes which will be prescribed as well. Pain is improved but persists. LFT is mildly abnormal but Lipase is normal. Tolerating diet. There was no clear indication to repeat imaging studies. He had a CT in March. No concerning signs such as fever or remarkably abnormal LFT's.   Dehydration  Secondary to problem #1. Improved with IVF.   Hypokalemia  Repleted   Alcohol dependence  Patient volunteered to drinking a liter of vodka daily-last episode was approximately 24 hours prior to admission. He was at high risk for alcohol withdrawal/DTs but he remained stable. He was placed on Ativan protocol. He was counseled.  Substance abuse  THC and cocaine. Patient denies cocaine use but was positive on UDS on 4/21. Cessation counseled.   History of seizures  Seems to be noncompliant with Dilantin as well. Continue Dilantin for now. Last seizure was 1 month ago when he off Dilantin.   Hypertension  Currently normotensive. Will not recommend amlodipine.  History of chronic pancreatitis, likely alcohol-related  Management as above.   Overall he remains stable. Non compliance is his main issue. He has been stabilized and does not need inpatient care any more. He is stable for discharge. Will simplify his medications.   PERTINENT LABS:  The results of significant diagnostics from this hospitalization (including imaging, microbiology, ancillary and laboratory) are listed below for reference.    Microbiology: Recent Results (from the past 240 hour(s))  MRSA PCR SCREENING     Status: None   Collection Time    06/11/12  9:22 PM      Result Value Range Status   MRSA  by PCR NEGATIVE  NEGATIVE Final   Comment:            The GeneXpert MRSA Assay (FDA     approved for NASAL specimens     only), is one component of a     comprehensive MRSA colonization     surveillance program. It is not     intended to diagnose MRSA     infection nor to guide or     monitor treatment for      MRSA infections.  CLOSTRIDIUM DIFFICILE BY PCR     Status: None   Collection Time    06/14/12  6:41 PM      Result Value Range Status   C difficile by pcr NEGATIVE  NEGATIVE Final     Labs: Basic Metabolic Panel:  Recent Labs Lab 06/11/12 2140 06/12/12 0340 06/13/12 0510 06/14/12 0515 06/15/12 0507  NA 138 139 134* 138 138  K 3.1* 3.1* 3.3* 3.5 3.6  CL 104 106 104 105 107  CO2 27 24 23 26 25   GLUCOSE 228* 138* 240* 128* 166*  BUN 6 6 4* 3* 3*  CREATININE 0.60 0.52 0.56 0.58 0.59  CALCIUM 8.4 8.4 8.0* 8.4 7.9*   Liver Function Tests:  Recent Labs Lab 06/13/12 1500 06/14/12 0515 06/15/12 0507  AST 41* 57* 36  ALT 32 57* 44  ALKPHOS 70 83 69  BILITOT 0.3 0.2* 0.2*  PROT 5.0* 5.9* 5.0*  ALBUMIN 2.5* 2.8* 2.4*    Recent Labs Lab 06/11/12 1515 06/13/12 1500  LIPASE 14 10*   No results found for this basename: AMMONIA,  in the last 168 hours CBC:  Recent Labs Lab 06/11/12 1515 06/14/12 0515 06/15/12 0507  WBC 5.7 5.1 4.6  NEUTROABS 3.8  --   --   HGB 12.8* 11.8* 10.4*  HCT 34.5* 33.8* 29.8*  MCV 83.7 85.6 85.9  PLT 222 153 122*   Cardiac Enzymes:  Recent Labs Lab 06/11/12 2140 06/12/12 0326  TROPONINI <0.30 <0.30   BNP: BNP (last 3 results) No results found for this basename: PROBNP,  in the last 8760 hours CBG:  Recent Labs Lab 06/14/12 0716 06/14/12 1112 06/14/12 1700 06/14/12 2112 06/15/12 0715  GLUCAP 192* 172* 223* 176* 213*     IMAGING STUDIES Dg Lumbar Spine 2-3 Views  05/29/2012  *RADIOLOGY REPORT*  Clinical Data: Right foot drop.  Pain.  No injury.  LUMBAR SPINE - 2-3 VIEW  Comparison: Plain film abdomen 05/28/2012 and CT of 04/13/2012  Findings: Five lumbar type vertebral bodies.  Sacroiliac joints are symmetric.  Maintenance of vertebral body height and alignment. Intervertebral disc heights are maintained.  IMPRESSION: No acute osseous abnormality.   Original Report Authenticated By: Jeronimo Greaves, M.D.    Dg Abd 1  View  05/29/2012  *RADIOLOGY REPORT*  Clinical Data: Abdominal pain and vomiting  ABDOMEN - 1 VIEW  Comparison: 04/13/2012 CT  Findings: Hemidiaphragms excluded from the image. The bowel gas pattern is non-obstructive. Organ outlines are normal where seen. No acute or aggressive osseous abnormality identified.  IMPRESSION: Nonobstructive bowel gas pattern.   Original Report Authenticated By: Jearld Lesch, M.D.    Ct Head Wo Contrast  05/28/2012  *RADIOLOGY REPORT*  Clinical Data: Severe headache  CT HEAD WITHOUT CONTRAST  Technique:  Contiguous axial images were obtained from the base of the skull through the vertex without contrast.  Comparison: None.  Findings: Mild atrophy for age.  No acute infarct.  Negative for intracranial hemorrhage or  mass.  Soft tissue swelling of the scalp right parietal region.  This could represent a scalp hematoma however no history of trauma is given.  This could also represent a benign or malignant skin mass lesion.  No skull abnormality is identified.  IMPRESSION: Mild atrophy.  No acute intracranial abnormality.  Skin thickening right parietal scalp.  Recommend evaluation for injury or hematoma in the area.   Original Report Authenticated By: Janeece Riggers, M.D.    Mri Brain Without Contrast  05/29/2012  *RADIOLOGY REPORT*  Clinical Data: Right leg weakness  MRI HEAD WITHOUT CONTRAST  Technique:  Multiplanar, multiecho pulse sequences of the brain and surrounding structures were obtained according to standard protocol without intravenous contrast.  Comparison: CT 05/28/2012  Findings: Negative for acute infarct.  Chronic infarct right globus pallidus.  Cerebral white matter is negative.  Brainstem is negative.  Small areas of chronic ischemia in the cerebellar white matter bilaterally.  Negative for intracranial hemorrhage or fluid collection. Ventricle size is normal.  No shift of the midline structures.  Paranasal sinuses are clear.  Soft tissue mass right parietal  scalp.  This is unchanged from the CT.  This could represent resolving hematoma or a neoplastic process.  Physical examination directed to this area is suggested.  IMPRESSION: No acute intracranial abnormality.  Right parietal scalp mass, question hematoma versus tumor.   Original Report Authenticated By: Janeece Riggers, M.D.     DISCHARGE EXAMINATION: Filed Vitals:   06/14/12 0550 06/14/12 1341 06/14/12 2125 06/15/12 0517  BP: 125/89 130/93 131/91 126/90  Pulse: 80 83 80 82  Temp: 98 F (36.7 C) 98.2 F (36.8 C) 97.5 F (36.4 C) 98 F (36.7 C)  TempSrc: Oral Oral Oral Oral  Resp: 18 18 18 20   Height:      Weight:      SpO2: 99% 100% 97% 97%   General appearance: alert, cooperative and no distress Resp: clear to auscultation bilaterally Cardio: regular rate and rhythm, S1, S2 normal, no murmur, click, rub or gallop GI: soft, tender in epig without rebound rigidity or guarding; bowel sounds normal; no masses,  no organomegaly Neurologic: Alert and oriented x 3. No focal deficits.  DISPOSITION: Homeless Shelter  Discharge Orders   Future Orders Complete By Expires     Diet - low sodium heart healthy  As directed     Discharge instructions  As directed     Comments:      Please take your medications as prescribed. Stop drinking alcohol. Seek attention if you develop vomiting, fever, chills.    Increase activity slowly  As directed        ALLERGIES:  Allergies  Allergen Reactions  . Morphine And Related Itching    Current Discharge Medication List    START taking these medications   Details  amylase-lipase-protease (LIPRAM-CR10) 33.03-20-35.5 MU per capsule Take 2 capsules by mouth 3 (three) times daily with meals. Qty: 180 capsule, Refills: 0    insulin NPH (HUMULIN N,NOVOLIN N) 100 UNIT/ML injection Inject 15 Units into the skin 2 (two) times daily before a meal. Qty: 1 vial, Refills: 3    loperamide (IMODIUM) 2 MG capsule Take 2 capsules (4 mg total) by mouth 4 (four)  times daily as needed for diarrhea or loose stools. Qty: 30 capsule, Refills: 1    ondansetron (ZOFRAN ODT) 4 MG disintegrating tablet Take 1 tablet (4 mg total) by mouth every 8 (eight) hours as needed for nausea. Qty: 20 tablet, Refills: 0  CONTINUE these medications which have CHANGED   Details  gabapentin (NEURONTIN) 300 MG capsule Take 1 capsule (300 mg total) by mouth 3 (three) times daily. Qty: 90 capsule, Refills: 1    mirtazapine (REMERON) 15 MG tablet Take 1 tablet (15 mg total) by mouth at bedtime. Qty: 30 tablet, Refills: 0    oxyCODONE (OXY IR/ROXICODONE) 5 MG immediate release tablet Take 1 tablet (5 mg total) by mouth every 6 (six) hours as needed for pain. Qty: 20 tablet, Refills: 0    phenytoin (DILANTIN) 100 MG ER capsule Take 2 capsules (200 mg total) by mouth at bedtime. Qty: 60 capsule, Refills: 6    simvastatin (ZOCOR) 10 MG tablet Take 1 tablet (10 mg total) by mouth daily at 6 PM. Qty: 30 tablet, Refills: 0      STOP taking these medications     amLODipine (NORVASC) 5 MG tablet      chlordiazePOXIDE (LIBRIUM) 10 MG capsule      insulin aspart (NOVOLOG) 100 UNIT/ML injection      insulin glargine (LANTUS) 100 UNIT/ML injection      metFORMIN (GLUCOPHAGE) 500 MG tablet      promethazine (PHENERGAN) 25 MG suppository          TOTAL DISCHARGE TIME: 35 mins  [facility]  [facility] Pager [phone]  06/15/2012, 8:33 AM

## 2012-06-15 NOTE — Progress Notes (Signed)
Patient states he has received instructions previously on "my chart" and does not need further info.  Patient given prescriptions and discharge instructions discussed.  He plans to take the bus, given 2 bus passes by the social worker to assist to get him to the pharmacy and then the [employer].

## 2012-06-16 ENCOUNTER — Emergency Department (HOSPITAL_COMMUNITY)
Admission: EM | Admit: 2012-06-16 | Discharge: 2012-06-17 | Disposition: A | Discharge status: Home / Self Care | Payer: MEDICAID | Attending: Emergency Medicine | Admitting: Emergency Medicine

## 2012-06-16 DIAGNOSIS — Z794 Long term (current) use of insulin: Secondary | ICD-10-CM | POA: Insufficient documentation

## 2012-06-16 DIAGNOSIS — R358 Other polyuria: Secondary | ICD-10-CM | POA: Insufficient documentation

## 2012-06-16 DIAGNOSIS — Z885 Allergy status to narcotic agent status: Secondary | ICD-10-CM | POA: Insufficient documentation

## 2012-06-16 DIAGNOSIS — Z87891 Personal history of nicotine dependence: Secondary | ICD-10-CM | POA: Insufficient documentation

## 2012-06-16 DIAGNOSIS — F10229 Alcohol dependence with intoxication, unspecified: Secondary | ICD-10-CM | POA: Insufficient documentation

## 2012-06-16 DIAGNOSIS — R112 Nausea with vomiting, unspecified: Secondary | ICD-10-CM | POA: Insufficient documentation

## 2012-06-16 DIAGNOSIS — G8929 Other chronic pain: Secondary | ICD-10-CM | POA: Insufficient documentation

## 2012-06-16 DIAGNOSIS — F10929 Alcohol use, unspecified with intoxication, unspecified: Secondary | ICD-10-CM

## 2012-06-16 DIAGNOSIS — R739 Hyperglycemia, unspecified: Secondary | ICD-10-CM

## 2012-06-16 DIAGNOSIS — E119 Type 2 diabetes mellitus without complications: Secondary | ICD-10-CM | POA: Insufficient documentation

## 2012-06-16 DIAGNOSIS — R3589 Other polyuria: Secondary | ICD-10-CM | POA: Insufficient documentation

## 2012-06-16 DIAGNOSIS — I10 Essential (primary) hypertension: Secondary | ICD-10-CM | POA: Insufficient documentation

## 2012-06-16 DIAGNOSIS — R569 Unspecified convulsions: Secondary | ICD-10-CM | POA: Insufficient documentation

## 2012-06-16 DIAGNOSIS — Z79899 Other long term (current) drug therapy: Secondary | ICD-10-CM | POA: Insufficient documentation

## 2012-06-16 DIAGNOSIS — R632 Polyphagia: Secondary | ICD-10-CM | POA: Insufficient documentation

## 2012-06-16 DIAGNOSIS — R109 Unspecified abdominal pain: Secondary | ICD-10-CM | POA: Insufficient documentation

## 2012-06-16 NOTE — ED Notes (Signed)
Pt c/o abd pain, cbg 532 per EMS, n/v and ETOH intoxication. S/s started today. Pt a/o x4.

## 2012-06-17 ENCOUNTER — Encounter (HOSPITAL_COMMUNITY): Payer: Self-pay | Admitting: Emergency Medicine

## 2012-06-17 LAB — COMPREHENSIVE METABOLIC PANEL
AST: 43 U/L — ABNORMAL HIGH (ref 0–37)
Albumin: 3 g/dL — ABNORMAL LOW (ref 3.5–5.2)
Calcium: 8.3 mg/dL — ABNORMAL LOW (ref 8.4–10.5)
Chloride: 99 mEq/L (ref 96–112)
Creatinine, Ser: 0.82 mg/dL (ref 0.50–1.35)
Sodium: 136 mEq/L (ref 135–145)
Total Bilirubin: 0.2 mg/dL — ABNORMAL LOW (ref 0.3–1.2)

## 2012-06-17 LAB — URINALYSIS, ROUTINE W REFLEX MICROSCOPIC
Glucose, UA: >1000 mg/dL — AB
Leukocytes, UA: NEGATIVE
pH: 5.5 (ref 5.0–8.0)

## 2012-06-17 LAB — CBC
MCV: 86.2 fL (ref 78.0–100.0)
Platelets: 172 10*3/uL (ref 150–400)
RDW: 11.9 % (ref 11.5–15.5)
WBC: 4.6 10*3/uL (ref 4.0–10.5)

## 2012-06-17 LAB — GLUCOSE, CAPILLARY: Glucose-Capillary: 439 mg/dL — ABNORMAL HIGH (ref 70–99)

## 2012-06-17 LAB — URINE MICROSCOPIC-ADD ON

## 2012-06-17 LAB — LIPASE, BLOOD: Lipase: 13 U/L (ref 11–59)

## 2012-06-17 MED ORDER — KETOROLAC TROMETHAMINE 30 MG/ML IJ SOLN
30.0000 mg | Freq: Once | INTRAMUSCULAR | Status: AC
  Administered 2012-06-17: 30 mg via INTRAVENOUS
  Filled 2012-06-17: qty 1

## 2012-06-17 MED ORDER — GI COCKTAIL ~~LOC~~
30.0000 mL | Freq: Once | ORAL | Status: AC
  Administered 2012-06-17: 30 mL via ORAL
  Filled 2012-06-17: qty 30

## 2012-06-17 MED ORDER — SODIUM CHLORIDE 0.9 % IV BOLUS (SEPSIS)
1000.0000 mL | Freq: Once | INTRAVENOUS | Status: AC
  Administered 2012-06-17: 1000 mL via INTRAVENOUS

## 2012-06-17 MED ORDER — INSULIN ASPART 100 UNIT/ML ~~LOC~~ SOLN
10.0000 [IU] | Freq: Once | SUBCUTANEOUS | Status: AC
  Administered 2012-06-17: 10 [IU] via SUBCUTANEOUS
  Filled 2012-06-17: qty 10

## 2012-06-17 MED ORDER — ONDANSETRON HCL 4 MG/2ML IJ SOLN
4.0000 mg | Freq: Once | INTRAMUSCULAR | Status: AC
  Administered 2012-06-17: 4 mg via INTRAVENOUS
  Filled 2012-06-17: qty 2

## 2012-06-17 MED ORDER — PANTOPRAZOLE SODIUM 40 MG IV SOLR
40.0000 mg | Freq: Once | INTRAVENOUS | Status: AC
  Administered 2012-06-17: 40 mg via INTRAVENOUS
  Filled 2012-06-17: qty 40

## 2012-06-17 NOTE — ED Provider Notes (Signed)
Medical screening examination/treatment/procedure(s) were performed by non-physician practitioner and as supervising physician I was immediately available for consultation/collaboration.   Shelda Jakes, MD 06/17/12 [phone]

## 2012-06-17 NOTE — ED Provider Notes (Signed)
History     CSN: [id number]  Arrival date & time 06/16/12  2354   First MD Initiated Contact with Patient 06/17/12 0057      Chief Complaint  Patient presents with  . Hyperglycemia  . Abdominal Pain  . Alcohol Intoxication   HPI  History provided by the patient. Patient is a 52 year old male with history of hypertension, diabetes, alcohol abuse and pancreatitis who presents with complaints of abdominal pain. Patient admits to drinking heavily today followed by worsening abdominal pains. He has one episode of nausea and vomiting earlier. Symptoms are similar to previous abdominal pains. Patient has multiple prior visits to emergency room for similar. Denies any fever, chills or sweats. He also reports that his blood sugar has been elevated. He has been taking most of his medications but admits to possibly missing some of his doses today. Denies any other aggravating or alleviating factors. Denies any other associated symptoms.    Past Medical History  Diagnosis Date  . Diabetes mellitus   . Hypertension   . Seizures   . Pancreatitis   . Alcohol abuse   . Chronic abdominal pain   . Alcoholism /alcohol abuse   . Hepatitis     Past Surgical History  Procedure Laterality Date  . Appendectomy      Family History  Problem Relation Age of Onset  . Hypertension      History  Substance Use Topics  . Smoking status: Former Smoker    Types: Cigarettes    Quit date: 12/21/1984  . Smokeless tobacco: Never Used  . Alcohol Use: Yes     Comment: heavy drinker: binge drinking per pt      Review of Systems  Constitutional: Negative for fever, chills and diaphoresis.  Respiratory: Negative for cough and shortness of breath.   Cardiovascular: Negative for chest pain.  Gastrointestinal: Positive for nausea, vomiting and abdominal pain. Negative for diarrhea and constipation.  Endocrine: Positive for polyphagia and polyuria.  Genitourinary: Negative for dysuria, frequency,  hematuria and flank pain.  All other systems reviewed and are negative.    Allergies  Morphine and related  Home Medications   Current Outpatient Rx  Name  Route  Sig  Dispense  Refill  . amylase-lipase-protease (LIPRAM-CR10) 33.03-20-35.5 MU per capsule   Oral   Take 2 capsules by mouth 3 (three) times daily with meals.   180 capsule   0   . gabapentin (NEURONTIN) 300 MG capsule   Oral   Take 1 capsule (300 mg total) by mouth 3 (three) times daily.   90 capsule   1   . insulin NPH (HUMULIN N,NOVOLIN N) 100 UNIT/ML injection   Subcutaneous   Inject 15 Units into the skin 2 (two) times daily before a meal.   1 vial   3   . loperamide (IMODIUM) 2 MG capsule   Oral   Take 2 capsules (4 mg total) by mouth 4 (four) times daily as needed for diarrhea or loose stools.   30 capsule   1   . mirtazapine (REMERON) 15 MG tablet   Oral   Take 1 tablet (15 mg total) by mouth at bedtime.   30 tablet   0   . ondansetron (ZOFRAN ODT) 4 MG disintegrating tablet   Oral   Take 1 tablet (4 mg total) by mouth every 8 (eight) hours as needed for nausea.   20 tablet   0   . oxyCODONE (OXY IR/ROXICODONE) 5 MG immediate release tablet  Oral   Take 1 tablet (5 mg total) by mouth every 6 (six) hours as needed for pain.   20 tablet   0   . phenytoin (DILANTIN) 100 MG ER capsule   Oral   Take 2 capsules (200 mg total) by mouth at bedtime.   60 capsule   6   . simvastatin (ZOCOR) 10 MG tablet   Oral   Take 1 tablet (10 mg total) by mouth daily at 6 PM.   30 tablet   0     BP 122/93  Pulse 90  Temp(Src) 97.8 F (36.6 C) (Oral)  Resp 18  SpO2 99%  Physical Exam  Nursing note and vitals reviewed. Constitutional: He is oriented to person, place, and time. He appears well-developed and well-nourished. No distress.  HENT:  Head: Normocephalic.  Cardiovascular: Normal rate and regular rhythm.   Pulmonary/Chest: Effort normal and breath sounds normal. No respiratory  distress. He has no wheezes. He has no rales.  Abdominal: Soft. There is tenderness in the epigastric area and left upper quadrant. There is no rebound, no guarding, no CVA tenderness, no tenderness at McBurney's point and negative Murphy's sign.  Musculoskeletal: Normal range of motion.  Neurological: He is alert and oriented to person, place, and time. He has normal strength. No sensory deficit. Gait normal.  Skin: Skin is warm.  Psychiatric: He has a normal mood and affect. His behavior is normal.    ED Course  Procedures   Results for orders placed during the hospital encounter of 06/16/12  CBC      Result Value Range   WBC 4.6  4.0 - 10.5 K/uL   RBC 3.55 (*) 4.22 - 5.81 MIL/uL   Hemoglobin 11.0 (*) 13.0 - 17.0 g/dL   HCT 96.0 (*) 45.4 - 09.8 %   MCV 86.2  78.0 - 100.0 fL   MCH 31.0  26.0 - 34.0 pg   MCHC 35.9  30.0 - 36.0 g/dL   RDW 11.9  14.7 - 82.9 %   Platelets 172  150 - 400 K/uL  COMPREHENSIVE METABOLIC PANEL      Result Value Range   Sodium 136  135 - 145 mEq/L   Potassium 3.8  3.5 - 5.1 mEq/L   Chloride 99  96 - 112 mEq/L   CO2 23  19 - 32 mEq/L   Glucose, Bld 489 (*) 70 - 99 mg/dL   BUN 4 (*) 6 - 23 mg/dL   Creatinine, Ser 5.62  0.50 - 1.35 mg/dL   Calcium 8.3 (*) 8.4 - 10.5 mg/dL   Total Protein 6.1  6.0 - 8.3 g/dL   Albumin 3.0 (*) 3.5 - 5.2 g/dL   AST 43 (*) 0 - 37 U/L   ALT 42  0 - 53 U/L   Alkaline Phosphatase 83  39 - 117 U/L   Total Bilirubin 0.2 (*) 0.3 - 1.2 mg/dL   GFR calc non Af Amer >90  >90 mL/min   GFR calc Af Amer >90  >90 mL/min  GLUCOSE, CAPILLARY      Result Value Range   Glucose-Capillary 466 (*) 70 - 99 mg/dL  URINALYSIS, ROUTINE W REFLEX MICROSCOPIC      Result Value Range   Color, Urine STRAW (*) YELLOW   APPearance CLEAR  CLEAR   Specific Gravity, Urine 1.019  1.005 - 1.030   pH 5.5  5.0 - 8.0   Glucose, UA >1000 (*) NEGATIVE mg/dL   Hgb urine dipstick NEGATIVE  NEGATIVE   Bilirubin Urine NEGATIVE  NEGATIVE   Ketones, ur  NEGATIVE  NEGATIVE mg/dL   Protein, ur NEGATIVE  NEGATIVE mg/dL   Urobilinogen, UA 0.2  0.0 - 1.0 mg/dL   Nitrite NEGATIVE  NEGATIVE   Leukocytes, UA NEGATIVE  NEGATIVE  LIPASE, BLOOD      Result Value Range   Lipase 13  11 - 59 U/L  URINE MICROSCOPIC-ADD ON      Result Value Range   WBC, UA 0-2  <3 WBC/hpf   RBC / HPF 0-2  <3 RBC/hpf  GLUCOSE, CAPILLARY      Result Value Range   Glucose-Capillary 439 (*) 70 - 99 mg/dL  GLUCOSE, CAPILLARY      Result Value Range   Glucose-Capillary 387 (*) 70 - 99 mg/dL   Comment 1 Documented in Chart     Comment 2 Notify RN          1. Hyperglycemia   2. Alcohol intoxication       MDM  1:15 AM patient seen and evaluated. Patient appears uncomfortable but in no acute distress. Does admit to drinking several beers today.  Patient with soft abdomen. No peritoneal signs or concerns for acute abdominal process. Patient with frequent multiple visits to emergency room for similar. Normal lipase today. Labs without significant findings aside from hyperglycemia. Fluids and insulin given. Blood sugar is improved. Patient walking without assistance and his peers clinically sober. He may be discharged at this time.      Angus Seller, PA-C 06/17/12 [phone]

## 2012-06-17 NOTE — ED Notes (Signed)
Pt admits to having "several" drinks tonight.  States his stomach began hurting- denies any vomiting- states he is nauseas at present.  Pt also complaining of a headache- states he thinks it is from his diabetes.  Pt was unable to fill prescriptions from recent discharge from hospital.  Alert and oriented at present.  Will continue to monitor pt.

## 2012-06-17 NOTE — ED Notes (Signed)
CBG was 439 

## 2012-06-29 ENCOUNTER — Encounter (HOSPITAL_COMMUNITY): Payer: Self-pay

## 2012-06-29 ENCOUNTER — Emergency Department (HOSPITAL_COMMUNITY)
Admission: EM | Admit: 2012-06-29 | Discharge: 2012-06-29 | Disposition: A | Discharge status: Home / Self Care | Payer: Self-pay | Attending: Emergency Medicine | Admitting: Emergency Medicine

## 2012-06-29 ENCOUNTER — Emergency Department (HOSPITAL_COMMUNITY): Payer: Self-pay

## 2012-06-29 DIAGNOSIS — I10 Essential (primary) hypertension: Secondary | ICD-10-CM | POA: Insufficient documentation

## 2012-06-29 DIAGNOSIS — Z794 Long term (current) use of insulin: Secondary | ICD-10-CM | POA: Insufficient documentation

## 2012-06-29 DIAGNOSIS — R109 Unspecified abdominal pain: Secondary | ICD-10-CM | POA: Insufficient documentation

## 2012-06-29 DIAGNOSIS — E1169 Type 2 diabetes mellitus with other specified complication: Secondary | ICD-10-CM | POA: Insufficient documentation

## 2012-06-29 DIAGNOSIS — R739 Hyperglycemia, unspecified: Secondary | ICD-10-CM

## 2012-06-29 DIAGNOSIS — R197 Diarrhea, unspecified: Secondary | ICD-10-CM | POA: Insufficient documentation

## 2012-06-29 DIAGNOSIS — R112 Nausea with vomiting, unspecified: Secondary | ICD-10-CM | POA: Insufficient documentation

## 2012-06-29 DIAGNOSIS — F10229 Alcohol dependence with intoxication, unspecified: Secondary | ICD-10-CM | POA: Insufficient documentation

## 2012-06-29 DIAGNOSIS — Z8719 Personal history of other diseases of the digestive system: Secondary | ICD-10-CM | POA: Insufficient documentation

## 2012-06-29 DIAGNOSIS — G40909 Epilepsy, unspecified, not intractable, without status epilepticus: Secondary | ICD-10-CM | POA: Insufficient documentation

## 2012-06-29 DIAGNOSIS — Z79899 Other long term (current) drug therapy: Secondary | ICD-10-CM | POA: Insufficient documentation

## 2012-06-29 DIAGNOSIS — F1010 Alcohol abuse, uncomplicated: Secondary | ICD-10-CM

## 2012-06-29 DIAGNOSIS — G8929 Other chronic pain: Secondary | ICD-10-CM | POA: Insufficient documentation

## 2012-06-29 LAB — COMPREHENSIVE METABOLIC PANEL
ALT: 35 U/L (ref 0–53)
AST: 25 U/L (ref 0–37)
Albumin: 3.8 g/dL (ref 3.5–5.2)
CO2: 21 mEq/L (ref 19–32)
Chloride: 87 mEq/L — ABNORMAL LOW (ref 96–112)
GFR calc non Af Amer: >90 mL/min (ref >90)
Sodium: 126 mEq/L — ABNORMAL LOW (ref 135–145)
Total Bilirubin: 0.4 mg/dL (ref 0.3–1.2)

## 2012-06-29 LAB — RAPID URINE DRUG SCREEN, HOSP PERFORMED
Amphetamines: NOT DETECTED
Benzodiazepines: NOT DETECTED
Cocaine: POSITIVE — AB

## 2012-06-29 LAB — GLUCOSE, CAPILLARY
Glucose-Capillary: 334 mg/dL — ABNORMAL HIGH (ref 70–99)
Glucose-Capillary: 240 mg/dL — ABNORMAL HIGH (ref 70–99)

## 2012-06-29 LAB — LIPASE, BLOOD: Lipase: 20 U/L (ref 11–59)

## 2012-06-29 LAB — URINALYSIS, ROUTINE W REFLEX MICROSCOPIC
Glucose, UA: >1000 mg/dL — AB
Hgb urine dipstick: NEGATIVE
Ketones, ur: 15 mg/dL — AB
Protein, ur: NEGATIVE mg/dL
pH: 6 (ref 5.0–8.0)

## 2012-06-29 LAB — KETONES, QUALITATIVE: Acetone, Bld: NEGATIVE

## 2012-06-29 LAB — CBC WITH DIFFERENTIAL/PLATELET
Basophils Absolute: 0 10*3/uL (ref 0.0–0.1)
Lymphocytes Relative: 34 % (ref 12–46)
Neutro Abs: 3 10*3/uL (ref 1.7–7.7)
Neutrophils Relative %: 61 % (ref 43–77)
Platelets: 184 10*3/uL (ref 150–400)
RDW: 11.5 % (ref 11.5–15.5)
WBC: 5 10*3/uL (ref 4.0–10.5)

## 2012-06-29 LAB — ETHANOL: Alcohol, Ethyl (B): 53 mg/dL — ABNORMAL HIGH (ref 0–11)

## 2012-06-29 MED ORDER — INSULIN ASPART 100 UNIT/ML IV SOLN
8.0000 [IU] | Freq: Once | INTRAVENOUS | Status: DC
  Filled 2012-06-29: qty 0.08

## 2012-06-29 MED ORDER — INSULIN ASPART 100 UNIT/ML IV SOLN
10.0000 [IU] | Freq: Once | INTRAVENOUS | Status: AC
  Administered 2012-06-29: 10 [IU] via INTRAVENOUS
  Filled 2012-06-29: qty 0.1

## 2012-06-29 MED ORDER — SODIUM CHLORIDE 0.9 % IV BOLUS (SEPSIS)
1000.0000 mL | Freq: Once | INTRAVENOUS | Status: AC
  Administered 2012-06-29: 1000 mL via INTRAVENOUS

## 2012-06-29 MED ORDER — IOHEXOL 300 MG/ML  SOLN
50.0000 mL | Freq: Once | INTRAMUSCULAR | Status: AC | PRN
  Administered 2012-06-29: 50 mL via ORAL

## 2012-06-29 MED ORDER — INSULIN ASPART 100 UNIT/ML IV SOLN
8.0000 [IU] | Freq: Once | INTRAVENOUS | Status: AC
  Administered 2012-06-29: 8 [IU] via INTRAVENOUS
  Filled 2012-06-29 (×2): qty 0.08

## 2012-06-29 MED ORDER — ONDANSETRON HCL 4 MG/2ML IJ SOLN
4.0000 mg | Freq: Once | INTRAMUSCULAR | Status: AC
  Administered 2012-06-29: 4 mg via INTRAVENOUS
  Filled 2012-06-29: qty 2

## 2012-06-29 MED ORDER — GI COCKTAIL ~~LOC~~
30.0000 mL | Freq: Once | ORAL | Status: AC
  Administered 2012-06-29: 30 mL via ORAL
  Filled 2012-06-29: qty 30

## 2012-06-29 MED ORDER — THIAMINE HCL 100 MG/ML IJ SOLN
100.0000 mg | Freq: Once | INTRAMUSCULAR | Status: AC
  Administered 2012-06-29: 06:00:00 via INTRAVENOUS
  Filled 2012-06-29: qty 2

## 2012-06-29 MED ORDER — IOHEXOL 300 MG/ML  SOLN
80.0000 mL | Freq: Once | INTRAMUSCULAR | Status: AC | PRN
  Administered 2012-06-29: 80 mL via INTRAVENOUS

## 2012-06-29 MED ORDER — KETOROLAC TROMETHAMINE 30 MG/ML IJ SOLN
30.0000 mg | Freq: Once | INTRAMUSCULAR | Status: AC
  Administered 2012-06-29: 30 mg via INTRAVENOUS
  Filled 2012-06-29: qty 1

## 2012-06-29 NOTE — ED Notes (Signed)
Critical value 625 blood glucose

## 2012-06-29 NOTE — ED Provider Notes (Signed)
History     CSN: [id number]  Arrival date & time 06/29/12  0148   First MD Initiated Contact with Patient 06/29/12 0149      Chief Complaint  Patient presents with  . Abdominal Pain  . Hyperglycemia  . Vomiting  . Nausea    (Consider location/radiation/quality/duration/timing/severity/associated sxs/prior treatment) HPI Comments: Patient comes to the ER for evaluation of abdominal pain. Patient reports that he started having left upper abdominal pain earlier today. He has had nausea, vomiting and diarrhea associated with the symptoms. Patient reports previous history of pancreatitis and this feels similar. He admits that he has been drinking heavily today. He does have a history of heavy drinking/binge drinking chronically.  Patient is a 52 y.o. male presenting with abdominal pain and hyperglycemia.  Abdominal Pain Associated symptoms include abdominal pain. Pertinent negatives include no chest pain and no shortness of breath.  Hyperglycemia Associated symptoms: abdominal pain, nausea and vomiting   Associated symptoms: no chest pain and no shortness of breath     Past Medical History  Diagnosis Date  . Diabetes mellitus   . Hypertension   . Seizures   . Pancreatitis   . Alcohol abuse   . Chronic abdominal pain   . Alcoholism /alcohol abuse   . Hepatitis     Past Surgical History  Procedure Laterality Date  . Appendectomy      Family History  Problem Relation Age of Onset  . Hypertension      History  Substance Use Topics  . Smoking status: Former Smoker    Types: Cigarettes    Quit date: 12/21/1984  . Smokeless tobacco: Never Used  . Alcohol Use: Yes     Comment: heavy drinker: binge drinking per pt      Review of Systems  Respiratory: Negative for shortness of breath.   Cardiovascular: Negative for chest pain.  Gastrointestinal: Positive for nausea, vomiting, abdominal pain and diarrhea.  All other systems reviewed and are negative.    Allergies   Morphine and related  Home Medications   Current Outpatient Rx  Name  Route  Sig  Dispense  Refill  . amylase-lipase-protease (LIPRAM-CR10) 33.03-20-35.5 MU per capsule   Oral   Take 2 capsules by mouth 3 (three) times daily with meals.   180 capsule   0   . gabapentin (NEURONTIN) 300 MG capsule   Oral   Take 1 capsule (300 mg total) by mouth 3 (three) times daily.   90 capsule   1   . insulin NPH (HUMULIN N,NOVOLIN N) 100 UNIT/ML injection   Subcutaneous   Inject 15 Units into the skin 2 (two) times daily before a meal.   1 vial   3   . loperamide (IMODIUM) 2 MG capsule   Oral   Take 2 capsules (4 mg total) by mouth 4 (four) times daily as needed for diarrhea or loose stools.   30 capsule   1   . mirtazapine (REMERON) 15 MG tablet   Oral   Take 1 tablet (15 mg total) by mouth at bedtime.   30 tablet   0   . ondansetron (ZOFRAN ODT) 4 MG disintegrating tablet   Oral   Take 1 tablet (4 mg total) by mouth every 8 (eight) hours as needed for nausea.   20 tablet   0   . oxyCODONE (OXY IR/ROXICODONE) 5 MG immediate release tablet   Oral   Take 1 tablet (5 mg total) by mouth every 6 (six) hours  as needed for pain.   20 tablet   0   . phenytoin (DILANTIN) 100 MG ER capsule   Oral   Take 2 capsules (200 mg total) by mouth at bedtime.   60 capsule   6   . simvastatin (ZOCOR) 10 MG tablet   Oral   Take 1 tablet (10 mg total) by mouth daily at 6 PM.   30 tablet   0     There were no vitals taken for this visit.  Physical Exam  Constitutional: He is oriented to person, place, and time. He appears well-developed and well-nourished. No distress.  HENT:  Head: Normocephalic and atraumatic.  Right Ear: Hearing normal.  Left Ear: Hearing normal.  Nose: Nose normal.  Mouth/Throat: Oropharynx is clear and moist and mucous membranes are normal.  Eyes: Conjunctivae and EOM are normal. Pupils are equal, round, and reactive to light.  Neck: Normal range of motion.  Neck supple.  Cardiovascular: Regular rhythm, S1 normal and S2 normal.  Exam reveals no gallop and no friction rub.   No murmur heard. Pulmonary/Chest: Effort normal and breath sounds normal. No respiratory distress. He exhibits no tenderness.  Abdominal: Soft. Normal appearance and bowel sounds are normal. There is no hepatosplenomegaly. There is tenderness in the epigastric area and left upper quadrant. There is no rebound, no guarding, no tenderness at McBurney's point and negative Murphy's sign. No hernia.  Musculoskeletal: Normal range of motion.  Neurological: He is alert and oriented to person, place, and time. He has normal strength. No cranial nerve deficit or sensory deficit. Coordination normal. GCS eye subscore is 4. GCS verbal subscore is 5. GCS motor subscore is 6.  Skin: Skin is warm, dry and intact. No rash noted. No cyanosis.  Psychiatric: He has a normal mood and affect. His speech is normal and behavior is normal. Thought content normal.    ED Course  Procedures (including critical care time)  EKG:  Date: 06/29/2012  Rate: 95  Rhythm: normal sinus rhythm  QRS Axis: normal  Intervals: normal  ST/T Wave abnormalities: nonspecific ST/T changes  Conduction Disutrbances:none  Narrative Interpretation:   Old EKG Reviewed: unchanged    Labs Reviewed  CBC WITH DIFFERENTIAL - Abnormal; Notable for the following:    RBC 4.13 (*)    Hemoglobin 12.7 (*)    HCT 34.8 (*)    MCHC 36.5 (*)    All other components within normal limits  COMPREHENSIVE METABOLIC PANEL - Abnormal; Notable for the following:    Sodium 126 (*)    Potassium 3.3 (*)    Chloride 87 (*)    Glucose, Bld 624 (*)    All other components within normal limits  URINALYSIS, ROUTINE W REFLEX MICROSCOPIC - Abnormal; Notable for the following:    Specific Gravity, Urine 1.039 (*)    Glucose, UA >1000 (*)    Ketones, ur 15 (*)    All other components within normal limits  URINE RAPID DRUG SCREEN (HOSP  PERFORMED) - Abnormal; Notable for the following:    Cocaine POSITIVE (*)    All other components within normal limits  ETHANOL - Abnormal; Notable for the following:    Alcohol, Ethyl (B) 53 (*)    All other components within normal limits  GLUCOSE, CAPILLARY - Abnormal; Notable for the following:    Glucose-Capillary 334 (*)    All other components within normal limits  TROPONIN I  LIPASE, BLOOD  URINE MICROSCOPIC-ADD ON  KETONES, QUALITATIVE   Ct  Abdomen Pelvis W Contrast  06/29/2012   *RADIOLOGY REPORT*  Clinical Data: Abdominal pain.  History of pancreatitis.  CT ABDOMEN AND PELVIS WITH CONTRAST  Technique:  Multidetector CT imaging of the abdomen and pelvis was performed following the standard protocol during bolus administration of intravenous contrast.  Contrast: 80mL OMNIPAQUE IOHEXOL 300 MG/ML  SOLN  Comparison: 04/13/2012.  Findings: The lung bases are clear.  No pleural effusion.  The liver is unremarkable and stable.  No focal lesions or intrahepatic biliary dilatation.  The gallbladder is normal.  No common bile duct dilatation.  The pancreas demonstrates a stable small pseudocyst measuring a maximum of 2 cm in the body.  No findings for acute pancreatitis.  The spleen is normal in size.  No focal lesions.  The adrenal glands and kidneys are normal and stable.  The stomach, duodenum, small bowel and colon are unremarkable.  No inflammatory changes, mass lesions or obstruction.  No mesenteric or retroperitoneal masses or adenopathy.  Small scattered lymph nodes appears stable.  The aorta is normal in caliber.  Mild atherosclerotic changes.  The bladder appears normal.  The prostate gland is mildly enlarged but unchanged.  The seminal vesicles are normal.  No pelvic mass, adenopathy or free pelvic fluid collections.  No inguinal mass or hernia.  Since this CT scan of 10/23/2011 the patient has lost a significant amount of weight.  I do not see any significant intraperitoneal fat which was  fairly prominent on the prior examination.  No significant bony findings.  IMPRESSION:  1.  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 2.  No findings for acute pancreatitis. 3.  No acute abdominal/pelvic findings, mass lesions or adenopathy. 4.  Significant weight loss since 10/23/2011.   Original Report Authenticated By: Rudie Meyer, M.D.     Diagnosis: 1. Alcohol abuse 2. Abdominal pain 3. Hyperglycemia without DKA 4. Medication noncompliance    MDM  Patient comes to the ER with complaints of abdominal pain. His history of chronic abdominal pain. He reports that the pain today is similar to pancreatitis. He did have diffuse abdominal tenderness, no sign of acute surgical abdomen. Lab work was unremarkable including a lipase. A CAT scan of his abdomen was performed and did not show any acute changes.  Patient had elevated blood sugar which was treated with IV fluids and insulin.  Patient's abdominal pain is chronic in nature. He was discharged home after no new findings were documented by workup here today.        Gilda Crease, MD 06/29/12 [phone]
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
## 2012-06-29 NOTE — ED Notes (Signed)
Patient transported to CT 

## 2012-07-01 ENCOUNTER — Encounter (HOSPITAL_COMMUNITY): Payer: Self-pay | Admitting: Emergency Medicine

## 2012-07-01 ENCOUNTER — Emergency Department (HOSPITAL_COMMUNITY)
Admission: EM | Admit: 2012-07-01 | Discharge: 2012-07-02 | Disposition: A | Discharge status: Home / Self Care | Payer: Self-pay

## 2012-07-01 ENCOUNTER — Observation Stay (HOSPITAL_COMMUNITY)
Admission: EM | Admit: 2012-07-01 | Discharge: 2012-07-02 | Payer: MEDICAID | Attending: Internal Medicine | Admitting: Internal Medicine

## 2012-07-01 DIAGNOSIS — E860 Dehydration: Secondary | ICD-10-CM

## 2012-07-01 DIAGNOSIS — E1110 Type 2 diabetes mellitus with ketoacidosis without coma: Secondary | ICD-10-CM

## 2012-07-01 DIAGNOSIS — K861 Other chronic pancreatitis: Principal | ICD-10-CM

## 2012-07-01 DIAGNOSIS — F329 Major depressive disorder, single episode, unspecified: Secondary | ICD-10-CM

## 2012-07-01 DIAGNOSIS — M21371 Foot drop, right foot: Secondary | ICD-10-CM

## 2012-07-01 DIAGNOSIS — E119 Type 2 diabetes mellitus without complications: Secondary | ICD-10-CM

## 2012-07-01 DIAGNOSIS — K920 Hematemesis: Secondary | ICD-10-CM

## 2012-07-01 DIAGNOSIS — E1165 Type 2 diabetes mellitus with hyperglycemia: Secondary | ICD-10-CM

## 2012-07-01 DIAGNOSIS — K862 Cyst of pancreas: Secondary | ICD-10-CM

## 2012-07-01 DIAGNOSIS — G40909 Epilepsy, unspecified, not intractable, without status epilepticus: Secondary | ICD-10-CM | POA: Insufficient documentation

## 2012-07-01 DIAGNOSIS — R109 Unspecified abdominal pain: Secondary | ICD-10-CM

## 2012-07-01 DIAGNOSIS — Z794 Long term (current) use of insulin: Secondary | ICD-10-CM | POA: Insufficient documentation

## 2012-07-01 DIAGNOSIS — E1100 Type 2 diabetes mellitus with hyperosmolarity without nonketotic hyperglycemic-hyperosmolar coma (NKHHC): Secondary | ICD-10-CM

## 2012-07-01 DIAGNOSIS — E876 Hypokalemia: Secondary | ICD-10-CM

## 2012-07-01 DIAGNOSIS — N401 Enlarged prostate with lower urinary tract symptoms: Secondary | ICD-10-CM

## 2012-07-01 DIAGNOSIS — F10239 Alcohol dependence with withdrawal, unspecified: Secondary | ICD-10-CM

## 2012-07-01 DIAGNOSIS — F3289 Other specified depressive episodes: Secondary | ICD-10-CM

## 2012-07-01 DIAGNOSIS — E1101 Type 2 diabetes mellitus with hyperosmolarity with coma: Secondary | ICD-10-CM | POA: Insufficient documentation

## 2012-07-01 DIAGNOSIS — B9789 Other viral agents as the cause of diseases classified elsewhere: Secondary | ICD-10-CM

## 2012-07-01 DIAGNOSIS — R197 Diarrhea, unspecified: Secondary | ICD-10-CM

## 2012-07-01 DIAGNOSIS — G4700 Insomnia, unspecified: Secondary | ICD-10-CM

## 2012-07-01 DIAGNOSIS — Z862 Personal history of diseases of the blood and blood-forming organs and certain disorders involving the immune mechanism: Secondary | ICD-10-CM

## 2012-07-01 DIAGNOSIS — F17200 Nicotine dependence, unspecified, uncomplicated: Secondary | ICD-10-CM | POA: Insufficient documentation

## 2012-07-01 DIAGNOSIS — R634 Abnormal weight loss: Secondary | ICD-10-CM

## 2012-07-01 DIAGNOSIS — R45851 Suicidal ideations: Secondary | ICD-10-CM

## 2012-07-01 DIAGNOSIS — M5412 Radiculopathy, cervical region: Secondary | ICD-10-CM

## 2012-07-01 DIAGNOSIS — R112 Nausea with vomiting, unspecified: Secondary | ICD-10-CM

## 2012-07-01 DIAGNOSIS — Z9119 Patient's noncompliance with other medical treatment and regimen: Secondary | ICD-10-CM

## 2012-07-01 DIAGNOSIS — F1410 Cocaine abuse, uncomplicated: Secondary | ICD-10-CM

## 2012-07-01 DIAGNOSIS — K5900 Constipation, unspecified: Secondary | ICD-10-CM

## 2012-07-01 DIAGNOSIS — R569 Unspecified convulsions: Secondary | ICD-10-CM

## 2012-07-01 DIAGNOSIS — F10939 Alcohol use, unspecified with withdrawal, unspecified: Secondary | ICD-10-CM

## 2012-07-01 DIAGNOSIS — N138 Other obstructive and reflux uropathy: Secondary | ICD-10-CM

## 2012-07-01 DIAGNOSIS — Z91199 Patient's noncompliance with other medical treatment and regimen due to unspecified reason: Secondary | ICD-10-CM

## 2012-07-01 DIAGNOSIS — F1010 Alcohol abuse, uncomplicated: Secondary | ICD-10-CM

## 2012-07-01 DIAGNOSIS — E782 Mixed hyperlipidemia: Secondary | ICD-10-CM

## 2012-07-01 LAB — POCT I-STAT 3, VENOUS BLOOD GAS (G3P V)
Acid-Base Excess: 3 mmol/L — ABNORMAL HIGH (ref 0.0–2.0)
pCO2, Ven: 45.4 mmHg (ref 45.0–50.0)
pO2, Ven: 34 mmHg (ref 30.0–45.0)

## 2012-07-01 LAB — LIPASE, BLOOD: Lipase: 16 U/L (ref 11–59)

## 2012-07-01 LAB — RAPID URINE DRUG SCREEN, HOSP PERFORMED
Amphetamines: NOT DETECTED
Barbiturates: NOT DETECTED
Benzodiazepines: NOT DETECTED
Cocaine: POSITIVE — AB
Opiates: NOT DETECTED
Tetrahydrocannabinol: NOT DETECTED

## 2012-07-01 LAB — URINALYSIS, ROUTINE W REFLEX MICROSCOPIC
Ketones, ur: NEGATIVE mg/dL
Leukocytes, UA: NEGATIVE
Nitrite: NEGATIVE
Specific Gravity, Urine: <1.005 — ABNORMAL LOW (ref 1.005–1.030)
pH: 6.5 (ref 5.0–8.0)

## 2012-07-01 LAB — CBC WITH DIFFERENTIAL/PLATELET
Basophils Absolute: 0 10*3/uL (ref 0.0–0.1)
Basophils Relative: 1 % (ref 0–1)
Eosinophils Absolute: 0 10*3/uL (ref 0.0–0.7)
Hemoglobin: 13.2 g/dL (ref 13.0–17.0)
MCH: 30.7 pg (ref 26.0–34.0)
MCHC: 36.2 g/dL — ABNORMAL HIGH (ref 30.0–36.0)
Monocytes Relative: 5 % (ref 3–12)
Neutro Abs: 2 10*3/uL (ref 1.7–7.7)
Neutrophils Relative %: 56 % (ref 43–77)
Platelets: 174 10*3/uL (ref 150–400)
RDW: 11.4 % — ABNORMAL LOW (ref 11.5–15.5)

## 2012-07-01 LAB — COMPREHENSIVE METABOLIC PANEL
AST: 38 U/L — ABNORMAL HIGH (ref 0–37)
Albumin: 3.8 g/dL (ref 3.5–5.2)
Alkaline Phosphatase: 106 U/L (ref 39–117)
BUN: 5 mg/dL — ABNORMAL LOW (ref 6–23)
Chloride: 89 mEq/L — ABNORMAL LOW (ref 96–112)
Potassium: 3.2 mEq/L — ABNORMAL LOW (ref 3.5–5.1)
Total Bilirubin: 0.5 mg/dL (ref 0.3–1.2)
Total Protein: 7.4 g/dL (ref 6.0–8.3)

## 2012-07-01 LAB — GLUCOSE, CAPILLARY
Glucose-Capillary: 466 mg/dL — ABNORMAL HIGH (ref 70–99)
Glucose-Capillary: 320 mg/dL — ABNORMAL HIGH (ref 70–99)
Glucose-Capillary: 308 mg/dL — ABNORMAL HIGH (ref 70–99)

## 2012-07-01 LAB — URINE MICROSCOPIC-ADD ON

## 2012-07-01 LAB — KETONES, QUALITATIVE

## 2012-07-01 MED ORDER — LORAZEPAM 2 MG/ML IJ SOLN: 0.0000 mg | Freq: Four times a day (QID) | INTRAMUSCULAR | Status: DC

## 2012-07-01 MED ORDER — LORAZEPAM 2 MG/ML IJ SOLN: 0.0000 mg | Freq: Two times a day (BID) | INTRAMUSCULAR | Status: DC

## 2012-07-01 MED ORDER — MIRTAZAPINE 15 MG PO TABS
15.0000 mg | ORAL_TABLET | Freq: Every day | ORAL | Status: DC
  Administered 2012-07-01: 15 mg via ORAL
  Filled 2012-07-01 (×2): qty 1

## 2012-07-01 MED ORDER — SODIUM CHLORIDE 0.9 % IJ SOLN
3.0000 mL | Freq: Two times a day (BID) | INTRAMUSCULAR | Status: DC
  Administered 2012-07-01: 3 mL via INTRAVENOUS

## 2012-07-01 MED ORDER — INSULIN ASPART 100 UNIT/ML ~~LOC~~ SOLN
0.0000 [IU] | Freq: Three times a day (TID) | SUBCUTANEOUS | Status: DC
  Administered 2012-07-01 (×2): 7 [IU] via SUBCUTANEOUS
  Administered 2012-07-01 – 2012-07-02 (×2): 15 [IU] via SUBCUTANEOUS
  Administered 2012-07-02: 12:00:00 via SUBCUTANEOUS

## 2012-07-01 MED ORDER — SODIUM CHLORIDE 0.9 % IV SOLN
INTRAVENOUS | Status: DC
  Administered 2012-07-01: 07:00:00 via INTRAVENOUS

## 2012-07-01 MED ORDER — LOPERAMIDE HCL 2 MG PO CAPS
4.0000 mg | ORAL_CAPSULE | Freq: Four times a day (QID) | ORAL | Status: DC | PRN
  Administered 2012-07-02: 4 mg via ORAL
  Filled 2012-07-01 (×2): qty 1

## 2012-07-01 MED ORDER — ONDANSETRON HCL 4 MG/2ML IJ SOLN
4.0000 mg | Freq: Four times a day (QID) | INTRAMUSCULAR | Status: DC | PRN
  Administered 2012-07-01: 4 mg via INTRAVENOUS
  Filled 2012-07-01: qty 2

## 2012-07-01 MED ORDER — OXYCODONE HCL 5 MG PO TABS
5.0000 mg | ORAL_TABLET | Freq: Four times a day (QID) | ORAL | Status: DC | PRN
  Administered 2012-07-01 – 2012-07-02 (×4): 5 mg via ORAL
  Filled 2012-07-01 (×4): qty 1

## 2012-07-01 MED ORDER — ONDANSETRON HCL 4 MG PO TABS: 4.0000 mg | ORAL_TABLET | Freq: Four times a day (QID) | ORAL | Status: DC | PRN

## 2012-07-01 MED ORDER — INSULIN ASPART 100 UNIT/ML ~~LOC~~ SOLN
0.0000 [IU] | Freq: Every day | SUBCUTANEOUS | Status: DC
  Administered 2012-07-01: 4 [IU] via SUBCUTANEOUS

## 2012-07-01 MED ORDER — VITAMIN B-1 100 MG PO TABS
100.0000 mg | ORAL_TABLET | Freq: Every day | ORAL | Status: DC
  Administered 2012-07-02: 100 mg via ORAL
  Filled 2012-07-01 (×2): qty 1

## 2012-07-01 MED ORDER — INSULIN NPH (HUMAN) (ISOPHANE) 100 UNIT/ML ~~LOC~~ SUSP
15.0000 [IU] | Freq: Two times a day (BID) | SUBCUTANEOUS | Status: DC
Start: 2012-07-01 — End: 2012-07-02
  Administered 2012-07-01 (×2): 15 [IU] via SUBCUTANEOUS
  Filled 2012-07-01: qty 10

## 2012-07-01 MED ORDER — GABAPENTIN 300 MG PO CAPS
300.0000 mg | ORAL_CAPSULE | Freq: Three times a day (TID) | ORAL | Status: DC
  Administered 2012-07-01 – 2012-07-02 (×4): 300 mg via ORAL
  Filled 2012-07-01 (×6): qty 1

## 2012-07-01 MED ORDER — POTASSIUM CHLORIDE CRYS ER 20 MEQ PO TBCR
40.0000 meq | EXTENDED_RELEASE_TABLET | Freq: Once | ORAL | Status: AC
  Administered 2012-07-01: 40 meq via ORAL
  Filled 2012-07-01: qty 2

## 2012-07-01 MED ORDER — THIAMINE HCL 100 MG/ML IJ SOLN
100.0000 mg | Freq: Every day | INTRAMUSCULAR | Status: DC
  Filled 2012-07-01: qty 1

## 2012-07-01 MED ORDER — HEPARIN SODIUM (PORCINE) 5000 UNIT/ML IJ SOLN
5000.0000 [IU] | Freq: Three times a day (TID) | INTRAMUSCULAR | Status: DC
  Administered 2012-07-01 – 2012-07-02 (×2): 5000 [IU] via SUBCUTANEOUS
  Filled 2012-07-01 (×7): qty 1

## 2012-07-01 MED ORDER — LORAZEPAM 2 MG/ML IJ SOLN: 1.0000 mg | Freq: Four times a day (QID) | INTRAMUSCULAR | Status: DC | PRN

## 2012-07-01 MED ORDER — LORAZEPAM 1 MG PO TABS
1.0000 mg | ORAL_TABLET | Freq: Four times a day (QID) | ORAL | Status: DC | PRN
  Administered 2012-07-01 – 2012-07-02 (×2): 1 mg via ORAL
  Filled 2012-07-01 (×2): qty 1

## 2012-07-01 MED ORDER — FOLIC ACID 1 MG PO TABS
1.0000 mg | ORAL_TABLET | Freq: Every day | ORAL | Status: DC
  Administered 2012-07-01 – 2012-07-02 (×2): 1 mg via ORAL
  Filled 2012-07-01 (×2): qty 1

## 2012-07-01 MED ORDER — SODIUM CHLORIDE 0.9 % IV BOLUS (SEPSIS)
2000.0000 mL | Freq: Once | INTRAVENOUS | Status: AC
  Administered 2012-07-01: 2000 mL via INTRAVENOUS

## 2012-07-01 MED ORDER — PHENYTOIN SODIUM EXTENDED 100 MG PO CAPS
200.0000 mg | ORAL_CAPSULE | Freq: Every day | ORAL | Status: DC
  Administered 2012-07-01: 200 mg via ORAL
  Filled 2012-07-01 (×2): qty 2

## 2012-07-01 MED ORDER — CHLORDIAZEPOXIDE HCL 5 MG PO CAPS
10.0000 mg | ORAL_CAPSULE | Freq: Three times a day (TID) | ORAL | Status: DC
  Administered 2012-07-01 – 2012-07-02 (×3): 10 mg via ORAL
  Filled 2012-07-01: qty 1
  Filled 2012-07-01 (×2): qty 2
  Filled 2012-07-01: qty 1

## 2012-07-01 MED ORDER — PANCRELIPASE (LIP-PROT-AMYL) 12000-38000 UNITS PO CPEP
1.0000 | ORAL_CAPSULE | Freq: Three times a day (TID) | ORAL | Status: DC
  Administered 2012-07-01 – 2012-07-02 (×4): 1 via ORAL
  Filled 2012-07-01 (×7): qty 1

## 2012-07-01 MED ORDER — SIMVASTATIN 10 MG PO TABS
10.0000 mg | ORAL_TABLET | Freq: Every day | ORAL | Status: DC
  Administered 2012-07-01: 10 mg via ORAL
  Filled 2012-07-01 (×2): qty 1

## 2012-07-01 MED ORDER — ADULT MULTIVITAMIN W/MINERALS CH
1.0000 | ORAL_TABLET | Freq: Every day | ORAL | Status: DC
  Administered 2012-07-01 – 2012-07-02 (×2): 1 via ORAL
  Filled 2012-07-01 (×2): qty 1

## 2012-07-01 MED ORDER — SODIUM CHLORIDE 0.9 % IV SOLN
INTRAVENOUS | Status: DC
  Administered 2012-07-01: 5.7 [IU]/h via INTRAVENOUS
  Filled 2012-07-01: qty 1

## 2012-07-01 MED ORDER — SODIUM CHLORIDE 0.9 % IV SOLN: INTRAVENOUS | Status: DC

## 2012-07-01 NOTE — ED Notes (Signed)
Pt wanded by security. 
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
## 2012-07-01 NOTE — ED Notes (Signed)
CBG was High on monitor.

## 2012-07-01 NOTE — Consult Note (Signed)
MEDICATION RELATED CONSULT NOTE - INITIAL   Pharmacy Consult for Phenytoin Indication: seizure disorder  Allergies  Allergen Reactions  . Morphine And Related Itching    Patient Measurements: Weight: 158 lb (71.668 kg)  Vital Signs: Temp: 98.3 F (36.8 C) (05/25 0706) Temp src: Oral (05/25 0706) BP: 126/87 mmHg (05/25 0706) Pulse Rate: 75 (05/25 0706) Intake/Output from previous day:   Intake/Output from this shift: Total I/O In: 240 [P.O.:240] Out: -   Labs:  Recent Labs  06/29/12 0151 07/01/12 0227  WBC 5.0 3.7*  HGB 12.7* 13.2  HCT 34.8* 36.5*  PLT 184 174  CREATININE 0.67 0.65  ALBUMIN 3.8 3.8  PROT 7.2 7.4  AST 25 38*  ALT 35 36  ALKPHOS 109 106  BILITOT 0.4 0.5   The CrCl is unknown because both a height and weight (above a minimum accepted value) are required for this calculation.   Microbiology: Recent Results (from the past 720 hour(s))  MRSA PCR SCREENING     Status: None   Collection Time    06/11/12  9:22 PM      Result Value Range Status   MRSA by PCR NEGATIVE  NEGATIVE Final   Comment:            The GeneXpert MRSA Assay (FDA     approved for NASAL specimens     only), is one component of a     comprehensive MRSA colonization     surveillance program. It is not     intended to diagnose MRSA     infection nor to guide or     monitor treatment for     MRSA infections.  CLOSTRIDIUM DIFFICILE BY PCR     Status: None   Collection Time    06/14/12  6:41 PM      Result Value Range Status   C difficile by pcr NEGATIVE  NEGATIVE Final    Medical History: Past Medical History  Diagnosis Date  . Diabetes mellitus   . Hypertension   . Seizures   . Pancreatitis   . Alcohol abuse   . Chronic abdominal pain   . Alcoholism /alcohol abuse   . Hepatitis    Assessment: 51yom on phenytoin 200mg  po qhs pta for hx seizures. He is being admitted for hyperosmolar hyperglycemia as well as suicidal ideation, and phenytoin to continue. Patient  states that he has missed a few doses of his phenytoin in the past week. No breakthrough seizures noted.    Goal of Therapy:  Free phenytoin level 1-2 Total phenytoin level 10-20  Plan:  1) Continue phenytoin 200mg  po QHS for now 2) Follow up free phenytoin level (it is in process)  Fredrik Rigger 07/01/2012,10:55 AM
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
## 2012-07-01 NOTE — Progress Notes (Signed)
[facility]                                                                                Patient Demographics  Gabriel Rush, is a 52 y.o. male, DOB - Oct 10, 1960, AVW:[id number], [id number]:[id number]  Admit date - 07/01/2012  Admitting Physician Houston Siren, MD  Outpatient Primary MD for the patient is No PCP Per Patient  LOS - 0   Chief Complaint  Patient presents with  . Suicidal        Assessment & Plan    1.Nonketotic hyperosmolar state in a patient with type 2 diabetes mellitus.- Stable now, continue IV fluids, continue scheduled home regimen of insulin along with additional sliding scale. Social work case management will be consulted to assist the patient with Getting his insulin refills.I seriously question his compliance also looking at his A1c.  Lab Results  Component Value Date   HGBA1C 14.3* 06/11/2012    CBG (last 3)   Recent Labs  07/01/12 0414 07/01/12 0525 07/01/12 0631  GLUCAP 466* 358* 320*    2.History of   PSEUDOCYST, PANCREAS-Outpatient followup with PCP and GI post discharge.Lipase was 16 this admission.   3. History of cocaine abuse, history of alcohol abuse. Counseled to quit both. Avoid beta blockers, folic acid and thiamine, CIWA protocol if needed.   4.History of seizure disorder. Pharmacy to monitor and dose Dilantin.    5.Depression with suicidal ideation. Continue sitter, psych called to see the patient.   6. Hyponatremia due to #1 above, continue hydration, repeat BMP in the morning.   Code Status: Full  Family Communication: None present  Disposition Plan: Home   Procedures CT scan abdomen and pelvis   Consults  Social work and case manager   DVT Prophylaxis   Heparin   Lab Results  Component Value Date   PLT 174 07/01/2012    Medications  Scheduled Meds: . folic acid  1 mg Oral Daily  . gabapentin  300 mg Oral TID  . heparin  5,000 Units Subcutaneous Q8H  . insulin aspart  0-20 Units Subcutaneous TID WC   . insulin aspart  0-5 Units Subcutaneous QHS  . insulin NPH  15 Units Subcutaneous BID AC  . lipase/protease/amylase  1 capsule Oral TID AC  . LORazepam  0-4 mg Intravenous Q6H   Followed by  . [START ON 07/03/2012] LORazepam  0-4 mg Intravenous Q12H  . mirtazapine  15 mg Oral QHS  . multivitamin with minerals  1 tablet Oral Daily  . phenytoin  200 mg Oral QHS  . simvastatin  10 mg Oral q1800  . sodium chloride  3 mL Intravenous Q12H  . thiamine  100 mg Oral Daily   Continuous Infusions: . sodium chloride 75 mL/hr at 07/01/12 1030   PRN Meds:.loperamide, LORazepam, LORazepam, ondansetron, oxyCODONE  Antibiotics     Anti-infectives   None       Time Spent in minutes  35   Lindy Pennisi K M.D on 07/01/2012 at 10:58 AM  Between 7am to 7pm - Pager - [phone]  After 7pm go to [email or web address] - password [email or web address]  And look for the night coverage person covering for me after hours  [facility] Office  [phone]    Subjective:   [name] today has, No headache, No chest pain, No abdominal pain - No Nausea, No new weakness tingling or numbness, No Cough - SOB.    Objective:   Filed Vitals:   07/01/12 0530 07/01/12 0545 07/01/12 0600 07/01/12 0706  BP: 131/101 130/101 131/102 126/87  Pulse: 86 84 89 75  Temp:    98.3 F (36.8 C)  TempSrc:    Oral  Resp: 16 15 13 18   Weight:    71.668 kg (158 lb)  SpO2: 100% 100% 100% 99%    Wt Readings from Last 3 Encounters:  07/01/12 71.668 kg (158 lb)  06/11/12 75.2 kg (165 lb 12.6 oz)  05/29/12 72 kg (158 lb 11.7 oz)     Intake/Output Summary (Last 24 hours) at 07/01/12 1058 Last data filed at 07/01/12 0801  Gross per 24 hour  Intake    240 ml  Output      0 ml  Net    240 ml    Exam Awake Alert, Oriented X 3, No new F.N deficits, depressed affect .AT,PERRAL Supple Neck,No JVD, No cervical lymphadenopathy appriciated.  Symmetrical Chest wall movement, Good air movement bilaterally,  CTAB RRR,No Gallops,Rubs or new Murmurs, No Parasternal Heave +ve B.Sounds, Abd Soft, Non tender, No organomegaly appriciated, No rebound - guarding or rigidity. No Cyanosis, Clubbing or edema, No new Rash or bruise     Data Review   Micro Results No results found for this or any previous visit (from the past 240 hour(s)).  Radiology Reports Ct Abdomen Pelvis W Contrast  06/29/2012   *RADIOLOGY REPORT*  Clinical Data: Abdominal pain.  History of pancreatitis.  CT ABDOMEN AND PELVIS WITH CONTRAST  Technique:  Multidetector CT imaging of the abdomen and pelvis was performed following the standard protocol during bolus administration of intravenous contrast.  Contrast: 80mL OMNIPAQUE IOHEXOL 300 MG/ML  SOLN  Comparison: 04/13/2012.  Findings: The lung bases are clear.  No pleural effusion.  The liver is unremarkable and stable.  No focal lesions or intrahepatic biliary dilatation.  The gallbladder is normal.  No common bile duct dilatation.  The pancreas demonstrates a stable small pseudocyst measuring a maximum of 2 cm in the body.  No findings for acute pancreatitis.  The spleen is normal in size.  No focal lesions.  The adrenal glands and kidneys are normal and stable.  The stomach, duodenum, small bowel and colon are unremarkable.  No inflammatory changes, mass lesions or obstruction.  No mesenteric or retroperitoneal masses or adenopathy.  Small scattered lymph nodes appears stable.  The aorta is normal in caliber.  Mild atherosclerotic changes.  The bladder appears normal.  The prostate gland is mildly enlarged but unchanged.  The seminal vesicles are normal.  No pelvic mass, adenopathy or free pelvic fluid collections.  No inguinal mass or hernia.  Since this CT scan of 10/23/2011 the patient has lost a significant amount of weight.  I do not see any significant intraperitoneal fat which was fairly prominent on the prior examination.  No significant bony findings.  IMPRESSION:  1.  Stable small  pseudocyst in the pancreatic body. 2.  No findings for acute pancreatitis. 3.  No acute abdominal/pelvic findings, mass lesions or adenopathy. 4.  Significant weight loss since 10/23/2011.   Original Report Authenticated By: Rudie Meyer, M.D.    CBC  Recent Labs Lab 06/29/12 0151 07/01/12 0227  WBC 5.0 3.7*  HGB 12.7*  13.2  HCT 34.8* 36.5*  PLT 184 174  MCV 84.3 84.9  MCH 30.8 30.7  MCHC 36.5* 36.2*  RDW 11.5 11.4*  LYMPHSABS 1.7 1.4  MONOABS 0.2 0.2  EOSABS 0.0 0.0  BASOSABS 0.0 0.0    Chemistries   Recent Labs Lab 06/29/12 0151 07/01/12 0227  NA 126* 128*  K 3.3* 3.2*  CL 87* 89*  CO2 21 27  GLUCOSE 624* 634*  BUN 9 5*  CREATININE 0.67 0.65  CALCIUM 9.2 9.4  AST 25 38*  ALT 35 36  ALKPHOS 109 106  BILITOT 0.4 0.5   ------------------------------------------------------------------------------------------------------------------ CrCl is unknown because both a height and weight (above a minimum accepted value) are required for this calculation. ------------------------------------------------------------------------------------------------------------------ No results found for this basename: HGBA1C,  in the last 72 hours ------------------------------------------------------------------------------------------------------------------ No results found for this basename: CHOL, HDL, LDLCALC, TRIG, CHOLHDL, LDLDIRECT,  in the last 72 hours ------------------------------------------------------------------------------------------------------------------ No results found for this basename: TSH, T4TOTAL, FREET3, T3FREE, THYROIDAB,  in the last 72 hours ------------------------------------------------------------------------------------------------------------------ No results found for this basename: VITAMINB12, FOLATE, FERRITIN, TIBC, IRON, RETICCTPCT,  in the last 72 hours  Coagulation profile No results found for this basename: INR, PROTIME,  in the last 168  hours  No results found for this basename: DDIMER,  in the last 72 hours  Cardiac Enzymes  Recent Labs Lab 06/29/12 0151  TROPONINI <0.30   ------------------------------------------------------------------------------------------------------------------ No components found with this basename: POCBNP,

## 2012-07-02 ENCOUNTER — Encounter (HOSPITAL_COMMUNITY): Payer: Self-pay | Admitting: *Deleted

## 2012-07-02 ENCOUNTER — Inpatient Hospital Stay (HOSPITAL_COMMUNITY)
Admission: AD | Admit: 2012-07-02 | Discharge: 2012-07-09 | DRG: 897 | Disposition: A | Discharge status: Home / Self Care | Payer: Federal, State, Local not specified - Other | Source: Intra-hospital | Attending: Psychiatry | Admitting: Psychiatry

## 2012-07-02 ENCOUNTER — Telehealth (HOSPITAL_COMMUNITY): Payer: Self-pay | Admitting: *Deleted

## 2012-07-02 DIAGNOSIS — R45851 Suicidal ideations: Secondary | ICD-10-CM

## 2012-07-02 DIAGNOSIS — K920 Hematemesis: Secondary | ICD-10-CM

## 2012-07-02 DIAGNOSIS — K862 Cyst of pancreas: Secondary | ICD-10-CM

## 2012-07-02 DIAGNOSIS — E860 Dehydration: Secondary | ICD-10-CM

## 2012-07-02 DIAGNOSIS — E782 Mixed hyperlipidemia: Secondary | ICD-10-CM

## 2012-07-02 DIAGNOSIS — Z79899 Other long term (current) drug therapy: Secondary | ICD-10-CM

## 2012-07-02 DIAGNOSIS — F1410 Cocaine abuse, uncomplicated: Secondary | ICD-10-CM

## 2012-07-02 DIAGNOSIS — B9789 Other viral agents as the cause of diseases classified elsewhere: Secondary | ICD-10-CM

## 2012-07-02 DIAGNOSIS — E876 Hypokalemia: Secondary | ICD-10-CM

## 2012-07-02 DIAGNOSIS — Z862 Personal history of diseases of the blood and blood-forming organs and certain disorders involving the immune mechanism: Secondary | ICD-10-CM

## 2012-07-02 DIAGNOSIS — E1100 Type 2 diabetes mellitus with hyperosmolarity without nonketotic hyperglycemic-hyperosmolar coma (NKHHC): Secondary | ICD-10-CM

## 2012-07-02 DIAGNOSIS — M21371 Foot drop, right foot: Secondary | ICD-10-CM

## 2012-07-02 DIAGNOSIS — R109 Unspecified abdominal pain: Secondary | ICD-10-CM

## 2012-07-02 DIAGNOSIS — Z91199 Patient's noncompliance with other medical treatment and regimen due to unspecified reason: Secondary | ICD-10-CM

## 2012-07-02 DIAGNOSIS — F329 Major depressive disorder, single episode, unspecified: Secondary | ICD-10-CM | POA: Diagnosis present

## 2012-07-02 DIAGNOSIS — F3289 Other specified depressive episodes: Secondary | ICD-10-CM | POA: Diagnosis present

## 2012-07-02 DIAGNOSIS — N401 Enlarged prostate with lower urinary tract symptoms: Secondary | ICD-10-CM

## 2012-07-02 DIAGNOSIS — R197 Diarrhea, unspecified: Secondary | ICD-10-CM

## 2012-07-02 DIAGNOSIS — G709 Myoneural disorder, unspecified: Secondary | ICD-10-CM

## 2012-07-02 DIAGNOSIS — E119 Type 2 diabetes mellitus without complications: Secondary | ICD-10-CM

## 2012-07-02 DIAGNOSIS — R112 Nausea with vomiting, unspecified: Secondary | ICD-10-CM

## 2012-07-02 DIAGNOSIS — R634 Abnormal weight loss: Secondary | ICD-10-CM

## 2012-07-02 DIAGNOSIS — Z9119 Patient's noncompliance with other medical treatment and regimen: Secondary | ICD-10-CM

## 2012-07-02 DIAGNOSIS — K5900 Constipation, unspecified: Secondary | ICD-10-CM

## 2012-07-02 DIAGNOSIS — M5412 Radiculopathy, cervical region: Secondary | ICD-10-CM

## 2012-07-02 DIAGNOSIS — IMO0001 Reserved for inherently not codable concepts without codable children: Secondary | ICD-10-CM | POA: Diagnosis present

## 2012-07-02 DIAGNOSIS — F1020 Alcohol dependence, uncomplicated: Principal | ICD-10-CM | POA: Diagnosis present

## 2012-07-02 DIAGNOSIS — E1110 Type 2 diabetes mellitus with ketoacidosis without coma: Secondary | ICD-10-CM

## 2012-07-02 DIAGNOSIS — G4700 Insomnia, unspecified: Secondary | ICD-10-CM

## 2012-07-02 DIAGNOSIS — R569 Unspecified convulsions: Secondary | ICD-10-CM

## 2012-07-02 DIAGNOSIS — K861 Other chronic pancreatitis: Secondary | ICD-10-CM

## 2012-07-02 DIAGNOSIS — Z8639 Personal history of other endocrine, nutritional and metabolic disease: Secondary | ICD-10-CM

## 2012-07-02 DIAGNOSIS — F1010 Alcohol abuse, uncomplicated: Secondary | ICD-10-CM

## 2012-07-02 HISTORY — DX: Headache: R51

## 2012-07-02 HISTORY — DX: Myoneural disorder, unspecified: G70.9

## 2012-07-02 LAB — BASIC METABOLIC PANEL
Calcium: 8.5 mg/dL (ref 8.4–10.5)
Chloride: 104 mEq/L (ref 96–112)
Creatinine, Ser: 0.48 mg/dL — ABNORMAL LOW (ref 0.50–1.35)
GFR calc Af Amer: >90 mL/min (ref >90)

## 2012-07-02 MED ORDER — MIRTAZAPINE 15 MG PO TABS
15.0000 mg | ORAL_TABLET | Freq: Every day | ORAL | Status: DC
  Administered 2012-07-02 – 2012-07-08 (×7): 15 mg via ORAL
  Filled 2012-07-02 (×9): qty 1
  Filled 2012-07-02: qty 14

## 2012-07-02 MED ORDER — POTASSIUM CHLORIDE CRYS ER 20 MEQ PO TBCR
40.0000 meq | EXTENDED_RELEASE_TABLET | Freq: Two times a day (BID) | ORAL | Status: DC
  Administered 2012-07-02: 40 meq via ORAL
  Filled 2012-07-02: qty 2

## 2012-07-02 MED ORDER — CHLORDIAZEPOXIDE HCL 5 MG PO CAPS: 10.0000 mg | ORAL_CAPSULE | Freq: Three times a day (TID) | ORAL | Status: DC

## 2012-07-02 MED ORDER — INSULIN ASPART 100 UNIT/ML ~~LOC~~ SOLN
0.0000 [IU] | Freq: Every day | SUBCUTANEOUS | Status: DC
  Administered 2012-07-02: 23:00:00 via SUBCUTANEOUS
  Administered 2012-07-03 – 2012-07-04 (×2): 5 [IU] via SUBCUTANEOUS
  Administered 2012-07-06: 3 [IU] via SUBCUTANEOUS
  Administered 2012-07-07: 4 [IU] via SUBCUTANEOUS
  Administered 2012-07-08: 2 [IU] via SUBCUTANEOUS

## 2012-07-02 MED ORDER — NICOTINE 14 MG/24HR TD PT24
14.0000 mg | MEDICATED_PATCH | Freq: Every day | TRANSDERMAL | Status: DC
  Filled 2012-07-02 (×4): qty 1

## 2012-07-02 MED ORDER — LOPERAMIDE HCL 2 MG PO CAPS
4.0000 mg | ORAL_CAPSULE | Freq: Four times a day (QID) | ORAL | Status: DC | PRN
  Administered 2012-07-03: 4 mg via ORAL
  Administered 2012-07-03: 2 mg via ORAL
  Administered 2012-07-03: 4 mg via ORAL

## 2012-07-02 MED ORDER — FOLIC ACID 1 MG PO TABS
1.0000 mg | ORAL_TABLET | Freq: Every day | ORAL | Status: DC
  Administered 2012-07-03 – 2012-07-09 (×7): 1 mg via ORAL
  Filled 2012-07-02 (×9): qty 1

## 2012-07-02 MED ORDER — VITAMIN B-1 100 MG PO TABS
100.0000 mg | ORAL_TABLET | Freq: Every day | ORAL | Status: DC
  Administered 2012-07-03 – 2012-07-09 (×7): 100 mg via ORAL
  Filled 2012-07-02 (×9): qty 1

## 2012-07-02 MED ORDER — MAGNESIUM SULFATE IN D5W 10-5 MG/ML-% IV SOLN
1.0000 g | Freq: Once | INTRAVENOUS | Status: AC
  Administered 2012-07-02: 1 g via INTRAVENOUS
  Filled 2012-07-02: qty 100

## 2012-07-02 MED ORDER — INSULIN GLARGINE 100 UNIT/ML ~~LOC~~ SOLN
30.0000 [IU] | Freq: Every day | SUBCUTANEOUS | Status: DC
  Administered 2012-07-02: 30 [IU] via SUBCUTANEOUS

## 2012-07-02 MED ORDER — INSULIN ASPART 100 UNIT/ML ~~LOC~~ SOLN
0.0000 [IU] | Freq: Three times a day (TID) | SUBCUTANEOUS | Status: DC
  Administered 2012-07-03 – 2012-07-04 (×4): 8 [IU] via SUBCUTANEOUS
  Administered 2012-07-04 – 2012-07-05 (×3): 15 [IU] via SUBCUTANEOUS
  Administered 2012-07-05: 8 [IU] via SUBCUTANEOUS
  Administered 2012-07-06: 11 [IU] via SUBCUTANEOUS
  Administered 2012-07-06: 15 [IU] via SUBCUTANEOUS
  Administered 2012-07-06 – 2012-07-07 (×2): 8 [IU] via SUBCUTANEOUS
  Administered 2012-07-07: 11 [IU] via SUBCUTANEOUS
  Administered 2012-07-07: 15 [IU] via SUBCUTANEOUS
  Administered 2012-07-08 (×2): 5 [IU] via SUBCUTANEOUS
  Administered 2012-07-08: 15 [IU] via SUBCUTANEOUS
  Administered 2012-07-09: 3 [IU] via SUBCUTANEOUS
  Administered 2012-07-09: 5 [IU] via SUBCUTANEOUS
  Filled 2012-07-02: qty 10

## 2012-07-02 MED ORDER — ACETAMINOPHEN 325 MG PO TABS
650.0000 mg | ORAL_TABLET | Freq: Four times a day (QID) | ORAL | Status: DC | PRN
  Administered 2012-07-07: 650 mg via ORAL

## 2012-07-02 MED ORDER — GABAPENTIN 300 MG PO CAPS
300.0000 mg | ORAL_CAPSULE | Freq: Three times a day (TID) | ORAL | Status: DC
  Administered 2012-07-02 – 2012-07-09 (×21): 300 mg via ORAL
  Filled 2012-07-02: qty 42
  Filled 2012-07-02: qty 1
  Filled 2012-07-02: qty 42
  Filled 2012-07-02 (×3): qty 1
  Filled 2012-07-02: qty 42
  Filled 2012-07-02 (×20): qty 1

## 2012-07-02 MED ORDER — CHLORDIAZEPOXIDE HCL 10 MG PO CAPS: 10.0000 mg | ORAL_CAPSULE | Freq: Three times a day (TID) | ORAL | Status: DC

## 2012-07-02 MED ORDER — ONDANSETRON 4 MG PO TBDP: 4.0000 mg | ORAL_TABLET | Freq: Three times a day (TID) | ORAL | Status: DC | PRN

## 2012-07-02 MED ORDER — THIAMINE HCL 100 MG PO TABS: 100.0000 mg | ORAL_TABLET | Freq: Every day | ORAL | Status: DC

## 2012-07-02 MED ORDER — INSULIN ASPART 100 UNIT/ML ~~LOC~~ SOLN: 0.0000 [IU] | Freq: Three times a day (TID) | SUBCUTANEOUS | Status: DC

## 2012-07-02 MED ORDER — CHLORDIAZEPOXIDE HCL 25 MG PO CAPS
25.0000 mg | ORAL_CAPSULE | Freq: Three times a day (TID) | ORAL | Status: DC
  Administered 2012-07-03 – 2012-07-09 (×20): 25 mg via ORAL
  Filled 2012-07-02 (×20): qty 1

## 2012-07-02 MED ORDER — MAGNESIUM HYDROXIDE 400 MG/5ML PO SUSP: 30.0000 mL | Freq: Every day | ORAL | Status: DC | PRN

## 2012-07-02 MED ORDER — PHENYTOIN SODIUM EXTENDED 100 MG PO CAPS
200.0000 mg | ORAL_CAPSULE | Freq: Every day | ORAL | Status: DC
  Administered 2012-07-02 – 2012-07-08 (×7): 200 mg via ORAL
  Filled 2012-07-02 (×10): qty 2

## 2012-07-02 MED ORDER — LORAZEPAM 1 MG PO TABS: 1.0000 mg | ORAL_TABLET | Freq: Four times a day (QID) | ORAL | Status: DC | PRN

## 2012-07-02 MED ORDER — SIMVASTATIN 10 MG PO TABS
10.0000 mg | ORAL_TABLET | Freq: Every day | ORAL | Status: DC
  Administered 2012-07-04 – 2012-07-08 (×5): 10 mg via ORAL
  Filled 2012-07-02 (×8): qty 1

## 2012-07-02 MED ORDER — GLUCERNA SHAKE PO LIQD
237.0000 mL | Freq: Two times a day (BID) | ORAL | Status: DC
Start: 2012-07-02 — End: 2012-07-02

## 2012-07-02 MED ORDER — CHLORDIAZEPOXIDE HCL 25 MG PO CAPS: 25.0000 mg | ORAL_CAPSULE | Freq: Three times a day (TID) | ORAL | Status: DC

## 2012-07-02 MED ORDER — TRAZODONE HCL 50 MG PO TABS
50.0000 mg | ORAL_TABLET | Freq: Every evening | ORAL | Status: DC | PRN
  Administered 2012-07-02 – 2012-07-03 (×2): 50 mg via ORAL
  Filled 2012-07-02 (×9): qty 1

## 2012-07-02 MED ORDER — INSULIN NPH (HUMAN) (ISOPHANE) 100 UNIT/ML ~~LOC~~ SUSP
20.0000 [IU] | Freq: Two times a day (BID) | SUBCUTANEOUS | Status: DC
  Administered 2012-07-02: 20 [IU] via SUBCUTANEOUS

## 2012-07-02 MED ORDER — GABAPENTIN 300 MG PO CAPS
300.0000 mg | ORAL_CAPSULE | Freq: Three times a day (TID) | ORAL | Status: DC
  Filled 2012-07-02: qty 1

## 2012-07-02 MED ORDER — PANCRELIPASE (LIP-PROT-AMYL) 12000-38000 UNITS PO CPEP
2.0000 | ORAL_CAPSULE | Freq: Three times a day (TID) | ORAL | Status: DC
  Administered 2012-07-03 – 2012-07-04 (×5): 2 via ORAL
  Filled 2012-07-02 (×11): qty 2

## 2012-07-02 MED ORDER — FOLIC ACID 1 MG PO TABS: 1.0000 mg | ORAL_TABLET | Freq: Every day | ORAL | Status: DC

## 2012-07-02 MED ORDER — ALUM & MAG HYDROXIDE-SIMETH 200-200-20 MG/5ML PO SUSP
30.0000 mL | ORAL | Status: DC | PRN
  Administered 2012-07-08: 30 mL via ORAL

## 2012-07-02 MED ORDER — INSULIN NPH (HUMAN) (ISOPHANE) 100 UNIT/ML ~~LOC~~ SUSP: 20.0000 [IU] | Freq: Two times a day (BID) | SUBCUTANEOUS | Status: DC

## 2012-07-02 NOTE — Care Management Note (Signed)
  Page 1 of 1   07/02/2012     10:42:22 AM   CARE MANAGEMENT NOTE 07/02/2012  Patient:  Gabriel Rush, Gabriel Rush   Account Number:  [id number]  Date Initiated:  07/02/2012  Documentation initiated by:  Ronny Flurry  Subjective/Objective Assessment:     Action/Plan:   Anticipated DC Date:  07/02/2012   Anticipated DC Plan:           Choice offered to / List presented to:             Status of service:   Medicare Important Message given?   (If response is "NO", the following Medicare IM given date fields will be blank) Date Medicare IM given:   Date Additional Medicare IM given:    Discharge Disposition:    Per UR Regulation:    If discussed at Long Length of Stay Meetings, dates discussed:    Comments:  07-02-12 Consult for medication assistance. Unfortunately patient has already been assisted recently and is not eligible at this time . Bedside nurse Megan aware.  Ronny Flurry RN [name]
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
## 2012-07-02 NOTE — Progress Notes (Signed)
Utilization review completed. Ayerim Berquist, RN, [name]. 

## 2012-07-02 NOTE — Discharge Summary (Signed)
[facility]                                                                                   Gabriel Rush, is a 52 y.o. male  DOB 19-May-1960  MRN [id number].  Admission date:  07/01/2012  Discharge Date:  07/02/2012  Primary MD  No PCP Per Patient  Admitting Physician  Houston Siren, MD  Admission Diagnosis  Chronic pancreatitis [577.1] Dehydration [276.51] DM hyperosmolarity type II, uncontrolled [250.22]  Discharge Diagnosis     Active Problems:   PSEUDOCYST, PANCREAS   SEIZURE DISORDER   Chronic pancreatitis   Cocaine abuse   Noncompliance with treatment   DM hyperosmolarity type II, uncontrolled   Dehydration   Alcohol abuse   Suicidal ideations    Past Medical History  Diagnosis Date  . Diabetes mellitus   . Hypertension   . Seizures   . Pancreatitis   . Alcohol abuse   . Chronic abdominal pain   . Alcoholism /alcohol abuse   . Hepatitis     Past Surgical History  Procedure Laterality Date  . Appendectomy       Recommendations for primary care physician for things to follow:   Follow CBGs closely, repeat BMP in 2 days   Discharge Diagnoses:   Active Problems:   PSEUDOCYST, PANCREAS   SEIZURE DISORDER   Chronic pancreatitis   Cocaine abuse   Noncompliance with treatment   DM hyperosmolarity type II, uncontrolled   Dehydration   Alcohol abuse   Suicidal ideations    Discharge Condition: Stable   Diet recommendation: See Discharge Instructions below   Consults Psych    History of present illness and  Hospital Course:     Kindly see H&P for history of present illness and admission details, please review complete Labs, Consult reports and Test reports for all details in brief Gabriel Rush, is a 52 y.o. male, patient with history of history of type 2 diabetes mellitus now insulin-dependent, chronic pancreatitis, seizure disorder, pancreatic pseudocyst, cocaine-alcohol-smoking abuse counseled to quit all who was admitted for  suicidal ideation, he was also found to be noncompliant with his insulin with his A1c being 14.3 and also presented with nonketotic hyperosmolar state, patient was initially treated with IV fluids along with aggressive sliding scale insulin, besides low potassium on his electrolytes are stable now, potassium has been replaced, he was seen by psychiatry recommended inpatient psych admission which has been requested through social work. He continues to have one [place] to one sitter, once inpatient psych admission is arranged patient will be released. He is medically stable.   For his diabetes mellitus type 2 his home dose NPH 7030 insulin has been adjusted and sliding-scale insulin has been added as below. Continue monitoring CBGs q. a.c. at bedtime.   Patient does have history of pancreatic pseudocyst one time outpatient followup with GI in one to 2 weeks post psych discharge is recommended kindly arrange for it prior to his discharge. Patient has been informed also by me personally.   For his history of alcohol/cocaine/nicotine abuse counseled to quit all, have placed him on scheduled Librium along with when necessary Ativan per CIWA protocol, monitor  for DTs. Avoid beta blocker due to recent cocaine use.   Hypokalemia potassium has been replaced, recheck potassium in one to 2 days along with magnesium levels.    Suicidal ideation. Seen by psych, sitter to be continued, currently does not appear to be suicidal per my interview, inpatient psych admission as recommended by psychiatrist to be arranged by social work.       Today   Subjective:   [place] today has no headache,no chest abdominal pain,no new weakness tingling or numbness, feels much better.  Objective:   Blood pressure 120/95, pulse 74, temperature 97.2 F (36.2 C), temperature source Oral, resp. rate 20, weight 71.668 kg (158 lb), SpO2 99.00%.   Intake/Output Summary (Last 24 hours) at 07/02/12 0751 Last data filed  at 07/02/12 0600  Gross per 24 hour  Intake   1620 ml  Output      0 ml  Net   1620 ml    Exam Awake Alert, Oriented *3, No new F.N deficits, Flat affect [place].AT,PERRAL Supple Neck,No JVD, No cervical lymphadenopathy appriciated.  Symmetrical Chest wall movement, Good air movement bilaterally, CTAB RRR,No Gallops,Rubs or new Murmurs, No Parasternal Heave +ve B.Sounds, Abd Soft, Non tender, No organomegaly appriciated, No rebound -guarding or rigidity. No Cyanosis, Clubbing or edema, No new Rash or bruise  Data Review   Major procedures and Radiology Reports - PLEASE review detailed and final reports for all details in brief -       Ct Abdomen Pelvis W Contrast  06/29/2012   *RADIOLOGY REPORT*  Clinical Data: Abdominal pain.  History of pancreatitis.  CT ABDOMEN AND PELVIS WITH CONTRAST  Technique:  Multidetector CT imaging of the abdomen and pelvis was performed following the standard protocol during bolus administration of intravenous contrast.  Contrast: 80mL OMNIPAQUE IOHEXOL 300 MG/ML  SOLN  Comparison: 04/13/2012.  Findings: The lung bases are clear.  No pleural effusion.  The liver is unremarkable and stable.  No focal lesions or intrahepatic biliary dilatation.  The gallbladder is normal.  No common bile duct dilatation.  The pancreas demonstrates a stable small pseudocyst measuring a maximum of 2 cm in the body.  No findings for acute pancreatitis.  The spleen is normal in size.  No focal lesions.  The adrenal glands and kidneys are normal and stable.  The stomach, duodenum, small bowel and colon are unremarkable.  No inflammatory changes, mass lesions or obstruction.  No mesenteric or retroperitoneal masses or adenopathy.  Small scattered lymph nodes appears stable.  The aorta is normal in caliber.  Mild atherosclerotic changes.  The bladder appears normal.  The prostate gland is mildly enlarged but unchanged.  The seminal vesicles are normal.  No pelvic mass, adenopathy or free pelvic  fluid collections.  No inguinal mass or hernia.  Since this CT scan of 10/23/2011 the patient has lost a significant amount of weight.  I do not see any significant intraperitoneal fat which was fairly prominent on the prior examination.  No significant bony findings.  IMPRESSION:  1.  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 2.  No findings for acute pancreatitis. 3.  No acute abdominal/pelvic findings, mass lesions or adenopathy. 4.  Significant weight loss since 10/23/2011.   Original Report Authenticated By: Rudie Meyer, M.D.    Micro Results      No results found for this or any previous visit (from the past 240 hour(s)).   CBC w Diff: Lab Results  Component Value Date   WBC  3.7* 07/01/2012   HGB 13.2 07/01/2012   HCT 36.5* 07/01/2012   PLT 174 07/01/2012   LYMPHOPCT 38 07/01/2012   BANDSPCT 0 04/12/2009   MONOPCT 5 07/01/2012   EOSPCT 1 07/01/2012   BASOPCT 1 07/01/2012    CMP: Lab Results  Component Value Date   NA 137 07/02/2012   K 3.1* 07/02/2012   CL 104 07/02/2012   CO2 24 07/02/2012   BUN 5* 07/02/2012   CREATININE 0.48* 07/02/2012   PROT 7.4 07/01/2012   ALBUMIN 3.8 07/01/2012   BILITOT 0.5 07/01/2012   ALKPHOS 106 07/01/2012   AST 38* 07/01/2012   ALT 36 07/01/2012  .   Discharge Instructions     Follow with Primary MD  in 7 days   Get CBC, CMP, checked 7 days by Primary MD and again as instructed by your Primary MD.    Get Medicines reviewed and adjusted.  Please request your Prim.MD to go over all Hospital Tests and Procedure/Radiological results at the follow up, please get all Hospital records sent to your Prim MD by signing hospital release before you go home.  Activity: As tolerated with Full fall precautions use walker/cane & assistance as needed   Diet:  Heart Healthy-Low Carb  For Heart failure patients - Check your Weight same time everyday, if you gain over 2 pounds, or you develop in leg swelling, experience more shortness of breath or chest pain,  call your Primary MD immediately. Follow Cardiac Low Salt Diet and 1.8 lit/day fluid restriction.  Disposition Psych Facility  If you experience worsening of your admission symptoms, develop shortness of breath, life threatening emergency, suicidal or homicidal thoughts you must seek medical attention immediately by calling 911 or calling your MD immediately  if symptoms less severe.  You Must read complete instructions/literature along with all the possible adverse reactions/side effects for all the Medicines you take and that have been prescribed to you. Take any new Medicines after you have completely understood and accpet all the possible adverse reactions/side effects.   Do not drive and provide baby sitting services if your were admitted for syncope or siezures until you have seen by Primary MD or a Neurologist and advised to do so again.  Do not drive when taking Pain medications.    Do not take more than prescribed Pain, Sleep and Anxiety Medications  Special Instructions: If you have smoked or chewed Tobacco  in the last 2 yrs please stop smoking, stop any regular Alcohol  and or any Recreational drug use.  Wear Seat belts while driving.   Follow-up Information   Follow up with Your primary care physician and your psychiatrist. Schedule an appointment as soon as possible for a visit in 5 days.        Discharge Medications     Medication List    TAKE these medications       amylase-lipase-protease 33.03-20-35.5 MU per capsule  Commonly known as:  LIPRAM-CR10  Take 2 capsules by mouth 3 (three) times daily with meals.     chlordiazePOXIDE 10 MG capsule  Commonly known as:  LIBRIUM  Take 1 capsule (10 mg total) by mouth 3 (three) times daily.     folic acid 1 MG tablet  Commonly known as:  FOLVITE  Take 1 tablet (1 mg total) by mouth daily.     gabapentin 300 MG capsule  Commonly known as:  NEURONTIN  Take 1 capsule (300 mg total) by mouth 3 (three) times daily.  insulin aspart 100 UNIT/ML injection  Commonly known as:  novoLOG  Inject 0-10 Units into the skin 3 (three) times daily with meals. Before each meal 3 times a day, 140-199 - 2 units, 200-250 - 4 units, 251-299 - 6 units,  300-349 - 8 units,  350 or above 10 units.     insulin NPH 100 UNIT/ML injection  Commonly known as:  HUMULIN N,NOVOLIN N  Inject 20 Units into the skin 2 (two) times daily before a meal.     loperamide 2 MG capsule  Commonly known as:  IMODIUM  Take 2 capsules (4 mg total) by mouth 4 (four) times daily as needed for diarrhea or loose stools.     LORazepam 1 MG tablet  Commonly known as:  ATIVAN  Take 1 tablet (1 mg total) by mouth every 6 (six) hours as needed for anxiety (CIWA-AR > 8  -OR-  withdrawal symptoms:  anxiety, agitation, insomnia, diaphoresis, nausea, vomiting, tremors, tachycardia, or hypertension.).     mirtazapine 15 MG tablet  Commonly known as:  REMERON  Take 1 tablet (15 mg total) by mouth at bedtime.     ondansetron 4 MG disintegrating tablet  Commonly known as:  ZOFRAN ODT  Take 1 tablet (4 mg total) by mouth every 8 (eight) hours as needed for nausea.     oxyCODONE 5 MG immediate release tablet  Commonly known as:  Oxy IR/ROXICODONE  Take 1 tablet (5 mg total) by mouth every 6 (six) hours as needed for pain.     phenytoin 100 MG ER capsule  Commonly known as:  DILANTIN  Take 2 capsules (200 mg total) by mouth at bedtime.     simvastatin 10 MG tablet  Commonly known as:  ZOCOR  Take 1 tablet (10 mg total) by mouth daily at 6 PM.     thiamine 100 MG tablet  Take 1 tablet (100 mg total) by mouth daily.           Total Time in preparing paper work, data evaluation and todays exam - 35 minutes  Leroy Sea M.D on 07/02/2012 at 7:51 AM  [facility] Office  [phone]

## 2012-07-02 NOTE — Tx Team (Signed)
Initial Interdisciplinary Treatment Plan  PATIENT STRENGTHS: (choose at least two) Ability for insight Active sense of humor Average or above average intelligence Capable of independent living Communication skills General fund of knowledge Motivation for treatment/growth Religious Affiliation Special hobby/interest  PATIENT STRESSORS: Financial difficulties Health problems Loss of mother in Jan 2014 Medication change or noncompliance Substance abuse   PROBLEM LIST: Problem List/Patient Goals Date to be addressed Date deferred Reason deferred Estimated date of resolution  "Help maintain a healthy lifestyle concerning my disease" 07/02/12     "To give me some tools on how to cope with life circumstances better" 07/02/12     "Help me get a plan" 07/02/12           Depression 07/02/12     Increased risk for suicide 07/02/12     Substance abuse 07/02/12     DM, Chronic pancreatitis, 07/02/12            DISCHARGE CRITERIA:  Ability to meet basic life and health needs Adequate post-discharge living arrangements Improved stabilization in mood, thinking, and/or behavior Medical problems require only outpatient monitoring Motivation to continue treatment in a less acute level of care Need for constant or close observation no longer present Reduction of life-threatening or endangering symptoms to within safe limits Safe-care adequate arrangements made Verbal commitment to aftercare and medication compliance Withdrawal symptoms are absent or subacute and managed without 24-hour nursing intervention  PRELIMINARY DISCHARGE PLAN: Attend 12-step recovery group Placement in alternative living arrangements  PATIENT/FAMIILY INVOLVEMENT: This treatment plan has been presented to and reviewed with the patient, Gabriel Rush, and/or family member.  The patient and family have been given the opportunity to ask questions and make suggestions.  Fransico Michael [facility] 07/02/2012, 8:56 PM
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
## 2012-07-02 NOTE — Progress Notes (Signed)
Adult Psychoeducational Group Note  Date:  07/02/2012 Time:  11:11 PM  Group Topic/Focus:  Wrap-Up Group:   The focus of this group is to help patients review their daily goal of treatment and discuss progress on daily workbooks.  Participation Level:  Did Not Attend  Participation Quality:    Affect:    Cognitive:    Insight:   Engagement in Group:    Modes of Intervention:    Additional Comments:    Humberto Seals Monique 07/02/2012, 11:11 PM

## 2012-07-02 NOTE — BHH Counselor (Signed)
Writer informed Almira Coaster, CSW that there are no 300-hall beds and the pt will be run when one becomes available.        Denice Bors, AADC 07/02/2012 12:13 PM

## 2012-07-02 NOTE — Clinical Social Work Psych Note (Addendum)
11:57am- CSW received a call from [place] at [facility] who stated there are no 300 hall beds available, but [facility] does expect d/c after lunchtime today.  [facility] will contact CSW once a bed becomes available.    Psych CSW received consult from Consulting civil engineer.  Psych CSW now following.  Psych CSW faxed referral to [facility].  Dr. Valora Corporal (psychiatry) note dated 05/25 states inpatient hospitalization for crisis management is recommended.    Pt has d/c summary already written.  Psych CSW awaiting a response from [facility].  Psych CSW will continue to follow.  Vickii Penna, LCSWA [phone]  Clinical Social Work '

## 2012-07-02 NOTE — Clinical Social Work Psych Note (Signed)
Psych CSW contacted [facility] to check on bed availability for pt.  Elijah Birk stated that he would check on this and call me back.  CSW unavailable after 3:30pm.  If medical staff would like to f/u with [facility].  Please contact [facility] at [phone].  The pt will need to sign a voluntary form for treatment which psych CSW has placed in pt chart.    Once pt has a bed at [facility].... VOLUNTARY FORM INSTRUCTIONS: (located in pt chart) - pt sign/date and print - RN sign/date/print and time stamp - Fax to [facility] [phone] - Original to accompany pt at [facility] - Call security for transport

## 2012-07-02 NOTE — ED Notes (Signed)
See paper documentation. Pt seen during unscheduled down-time

## 2012-07-03 ENCOUNTER — Encounter (HOSPITAL_COMMUNITY): Payer: Self-pay | Admitting: Psychiatry

## 2012-07-03 DIAGNOSIS — F1220 Cannabis dependence, uncomplicated: Secondary | ICD-10-CM

## 2012-07-03 DIAGNOSIS — F1420 Cocaine dependence, uncomplicated: Secondary | ICD-10-CM

## 2012-07-03 DIAGNOSIS — F1994 Other psychoactive substance use, unspecified with psychoactive substance-induced mood disorder: Secondary | ICD-10-CM

## 2012-07-03 DIAGNOSIS — F1020 Alcohol dependence, uncomplicated: Secondary | ICD-10-CM | POA: Diagnosis present

## 2012-07-03 LAB — GLUCOSE, CAPILLARY
Glucose-Capillary: 181 mg/dL — ABNORMAL HIGH (ref 70–99)
Glucose-Capillary: 422 mg/dL — ABNORMAL HIGH (ref 70–99)
Glucose-Capillary: 257 mg/dL — ABNORMAL HIGH (ref 70–99)
Glucose-Capillary: 298 mg/dL — ABNORMAL HIGH (ref 70–99)
Glucose-Capillary: 397 mg/dL — ABNORMAL HIGH (ref 70–99)

## 2012-07-03 MED ORDER — INSULIN GLARGINE 100 UNIT/ML ~~LOC~~ SOLN
36.0000 [IU] | Freq: Every day | SUBCUTANEOUS | Status: DC
  Administered 2012-07-03 – 2012-07-05 (×3): 36 [IU] via SUBCUTANEOUS

## 2012-07-03 MED ORDER — INSULIN ASPART 100 UNIT/ML ~~LOC~~ SOLN
20.0000 [IU] | Freq: Once | SUBCUTANEOUS | Status: AC
  Administered 2012-07-03: 12:00:00 via SUBCUTANEOUS

## 2012-07-03 NOTE — Progress Notes (Signed)
Recreation Therapy Notes  Date: 05.27.2014 Time: 2:45pm Location: 300 Hall Day Room      Group Topic/Focus: Musician (AAA/T)  Participation Level: Active  Participation Quality: Appropriate  Affect: Euthymic  Cognitive: Appropriate  Additional Comments:05.27.2014 Session = AAA Session; Dog Team = [facility] & handler  Patient pet and visited with [place]. Patient interacted appropriately with dog team, LRT and peers.   Marykay Lex Ingri Diemer, LRT/CTRS  Jorja Empie L 07/03/2012 4:01 PM

## 2012-07-03 NOTE — [facility] Group Notes (Signed)
[facility] LCSW Aftercare Discharge Planning Group Note   07/03/2012  8:45 AM  Participation Quality:  Appropriate  Mood/Affect:  Depressed  Depression Rating:  8  Anxiety Rating:  11  Thoughts of Suicide:  No Will you contract for safety?   NA  Current AVH:  No  Plan for Discharge/Comments:  Patient reports it was his idea to seek help due to "drugs, alcohol, homelessness and despair."  Patient has never sought help for CD before.  Transportation Means: None, uncertain  Supports: None  Gabriel Rush, [name]
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
## 2012-07-03 NOTE — [facility] Suicide Risk Assessment (Signed)
Suicide Risk Assessment  Admission Assessment     Nursing information obtained from:  Patient Demographic factors:  Male;Low socioeconomic status;Living alone;Unemployed Current Mental Status:  NA Loss Factors:  Loss of significant relationship;Decline in physical health;Financial problems / change in socioeconomic status Historical Factors:  NA Risk Reduction Factors:  Religious beliefs about death  CLINICAL FACTORS:   Depression:   Comorbid alcohol abuse/dependence Alcohol/Substance Abuse/Dependencies  COGNITIVE FEATURES THAT CONTRIBUTE TO RISK:  Closed-mindedness Polarized thinking Thought constriction (tunnel vision)    SUICIDE RISK:   Moderate:  Frequent suicidal ideation with limited intensity, and duration, some specificity in terms of plans, no associated intent, good self-control, limited dysphoria/symptomatology, some risk factors present, and identifiable protective factors, including available and accessible social support.  PLAN OF CARE: Supportive approach/coping skills/relapse prevention                               Detox                              Reassess and address the comorbidities  I certify that inpatient services furnished can reasonably be expected to improve the patient's condition.  Duilio Heritage A 07/03/2012, 12:53 PM

## 2012-07-03 NOTE — [facility] Group Notes (Signed)
[facility] LCSW Group Therapy  07/03/2012 1:15 PM  Type of Therapy:  Group Therapy 1:15 to 2:30 PM  Participation Level:  Active  Participation Quality:  Appropriate  Affect:  Appropriate  Cognitive:  Alert and Oriented  Insight:  Developing/Improving  Engagement in Therapy:  Developing/Improving  Modes of Intervention:  Discussion, Education, Rapport Building, Socialization and Support  Summary of Progress/Problems: Patient attended group presentation by staff member of  [facility] ([facility]). Gabriel Rush was attentive and appropriate during session.  He was able to process that using is a choice we cannot blame others for and no one has ever forced him to drink.   Clide Dales

## 2012-07-03 NOTE — H&P (Signed)
Psychiatric Admission Assessment Adult  Patient Identification:  Gabriel Rush  Date of Evaluation:  07/03/2012  Chief Complaint:  MOOD DISORDER NOS; COCAINE ABUSE  History of Present Illness: This is a 52 year old African-American male. Admitted to [facility] from the [facility] ED with complaints of increased cocaine use, increased depression and suicidal ideations with plans to walk in front of an oncoming traffic. Patient reports, "[facility] police took me to the hospital on 07/01/12 (Sunday). I called the cops because I was feeling very depressed and contemplating suicide at the time. I have been depressed x few months. But it got worse since the last 2 months after my mother died. Prior to all of these events, I have always been a chronic alcoholic. I also use crack cocaine and smoke week on daily basis. I started drinking alcohol and using drugs at 16. Drugs and alcohol make me feel good. They numb my depression most of the time, and at other times, they made my depression worse. I have lost everything I ever owned and that mattered to me because of drug use. I came in here seeking help to stop this habit. Besides my drug and alcohol use, I am also diabetic. I have chronic pancreatitis, hypertension and diabetic neuropathy. To make matters worse, I became homeless right after my mother passed".  Elements:  Location:  [facility] adult unit. Quality:  Daily cravings to use, daily use of crack cocaine, increased depression, suicidal thoughts, homelessness. Severity: Rated depression at #8, anxiety at #8. Timing:  I have using daily x 2 months. Duration:  I have been using since the age of 66. Context:  Increased depression, hopelessness, homelessness, financila problems, suicidal thoughts.  Associated Signs/Synptoms:  Depression Symptoms:  depressed mood, feelings of worthlessness/guilt, suicidal thoughts with specific plan, anxiety, insomnia, loss of energy/fatigue, disturbed  sleep,  (Hypo) Manic Symptoms:  Impulsivity,  Anxiety Symptoms:  Excessive Worry,  Psychotic Symptoms:  Hallucinations: Denies  PTSD Symptoms: Had a traumatic exposure:  None reported  Psychiatric Specialty Exam: Physical Exam  Constitutional: He is oriented to person, place, and time. He appears well-developed.  HENT:  Head: Normocephalic.  Eyes: Pupils are equal, round, and reactive to light.  Neck: Normal range of motion.  Cardiovascular: Normal rate.   Hx high blood pressure  Respiratory: Effort normal.  GI: Soft.  Musculoskeletal: Normal range of motion.  Neurological: He is alert and oriented to person, place, and time.  Skin: Skin is warm and dry.  Psychiatric: His speech is normal and behavior is normal. Thought content normal. His mood appears anxious (Rated at #8). His affect is not angry, not blunt, not labile and not inappropriate. Cognition and memory are normal. He expresses impulsivity. He exhibits a depressed mood (Rated at #8).    Review of Systems  Constitutional: Positive for chills, malaise/fatigue and diaphoresis.  HENT: Negative.   Eyes: Negative.   Respiratory: Negative.   Cardiovascular: Negative.   Gastrointestinal: Positive for nausea.  Genitourinary: Negative.   Musculoskeletal: Positive for myalgias.  Skin: Negative.   Neurological: Positive for tremors and seizures.  Endo/Heme/Allergies: Negative.   Psychiatric/Behavioral: Positive for depression (Rated #8) and substance abuse (Hx alcoholism, Cannabis abuse, Cocaine abuse). Negative for suicidal ideas, hallucinations and memory loss. The patient is nervous/anxious (Rated #8) and has insomnia.     Blood pressure 132/91, pulse 73, temperature 97.8 F (36.6 C), temperature source Oral, resp. rate 16, height 5\' 9"  (1.753 m), weight 80.74 kg (178 lb).Body mass index is  26.27 kg/(m^2).  General Appearance: Fairly Groomed  Patent attorney::  Fair  Speech:  Clear and Coherent  Volume:  Normal  Mood:   Anxious and Depressed  Affect:  Flat  Thought Process:  Coherent, Goal Directed and Intact  Orientation:  Full (Time, Place, and Person)  Thought Content:  Rumination  Suicidal Thoughts:  No, but present on admission  Homicidal Thoughts:  No  Memory:  Immediate;   Good Recent;   Good Remote;   Good  Judgement:  Fair  Insight:  Fair  Psychomotor Activity:  Anxious  Concentration:  Good  Recall:  Good  Akathisia:  No  Handed:  Right  AIMS (if indicated):     Assets:  Desire for Improvement  Sleep:  Number of Hours: 5.75    Past Psychiatric History: Diagnosis: Substance induced mood disorder, Cocaine dependence, Cannabis dependence  Hospitalizations: [facility]  Outpatient Care: None  Substance Abuse Care: None  Self-Mutilation: Denies  Suicidal Attempts: Denies attempts, admits thoughts  Violent Behaviors: See medication lists.   Past Medical History:   Past Medical History  Diagnosis Date  . Diabetes mellitus   . Hypertension   . Seizures   . Pancreatitis   . Alcohol abuse   . Chronic abdominal pain   . Alcoholism /alcohol abuse   . Neuromuscular disorder 07/02/2012    Diabetic neuropathy  . Headache    Seizure History:  Hx of Cardiac History:  HTN  Allergies:   Allergies  Allergen Reactions  . Morphine And Related Itching   PTA Medications: Prescriptions prior to admission  Medication Sig Dispense Refill  . amylase-lipase-protease (LIPRAM-CR10) 33.03-20-35.5 MU per capsule Take 2 capsules by mouth 3 (three) times daily with meals.  180 capsule  0  . gabapentin (NEURONTIN) 300 MG capsule Take 1 capsule (300 mg total) by mouth 3 (three) times daily.  90 capsule  1  . insulin aspart (NOVOLOG) 100 UNIT/ML injection Inject 0-10 Units into the skin 3 (three) times daily with meals. Before each meal 3 times a day, 140-199 - 2 units, 200-250 - 4 units, 251-299 - 6 units,  300-349 - 8 units,  350 or above 10 units.  1 vial  12  . insulin glargine (LANTUS) 100 UNIT/ML  injection Inject 30 Units into the skin at bedtime. Was taking this as a home med.      . loperamide (IMODIUM) 2 MG capsule Take 2 capsules (4 mg total) by mouth 4 (four) times daily as needed for diarrhea or loose stools.  30 capsule  1  . mirtazapine (REMERON) 15 MG tablet Take 1 tablet (15 mg total) by mouth at bedtime.  30 tablet  0  . ondansetron (ZOFRAN ODT) 4 MG disintegrating tablet Take 1 tablet (4 mg total) by mouth every 8 (eight) hours as needed for nausea.  20 tablet  0  . oxyCODONE (OXY IR/ROXICODONE) 5 MG immediate release tablet Take 1 tablet (5 mg total) by mouth every 6 (six) hours as needed for pain.  20 tablet  0  . phenytoin (DILANTIN) 100 MG ER capsule Take 2 capsules (200 mg total) by mouth at bedtime.  60 capsule  6  . simvastatin (ZOCOR) 10 MG tablet Take 1 tablet (10 mg total) by mouth daily at 6 PM.  30 tablet  0  . insulin NPH (HUMULIN N,NOVOLIN N) 100 UNIT/ML injection Inject 20 Units into the skin 2 (two) times daily before a meal.  1 vial  3    Previous Psychotropic  Medications:  Medication/Dose  See medication lists               Substance Abuse History in the last 12 months:  yes  Consequences of Substance Abuse: Medical Consequences:  Liver damage, Possible death by overdose Legal Consequences:  Arrests, jail time, Loss of driving privilege. Family Consequences:  Family discord, divorce and or separation.  Social History:  reports that he quit smoking about 27 years ago. His smoking use included Cigarettes. He smoked 0.00 packs per day. He has never used smokeless tobacco. He reports that  drinks alcohol. He reports that he uses illicit drugs (Marijuana and "Crack" cocaine). Additional Social History: Negative Consequences of Use: Financial;Legal;Personal relationships;Work / School 1 - Age of First Use: 16 1 - Amount (size/oz): 1 litre of vodka 1 - Frequency: daily 1 - Duration: 1 litre since 22 yrs of age 26 - Last Use / Amount: 2 days ago 2 -  Age of First Use: 26 yrs 2 - Frequency: every other day 2 - Last Use / Amount: 2 days ago 3 - Age of First Use: 52 yrs old 3 - Frequency: daily 3 - Duration: since 52yrs old 3 - Last Use / Amount: 2 days ago  Current Place of Residence: [place], [place]    Place of Birth: [place]   Family Members: None reported  Marital Status:  Single  Children: 0  Sons: 0  Daughters: 0  Relationships: Single  Education:  [employer] Financial planner Problems/Performance: Completed high school  Religious Beliefs/Practices: NA  History of Abuse (Emotional/Phsycial/Sexual): None reported  Occupational Experiences: English as a second language teacher History:  None.  Legal History: None reported  Hobbies/Interests: NA  Family History:   Family History  Problem Relation Age of Onset  . Hypertension      Results for orders placed during the hospital encounter of 07/02/12 (from the past 72 hour(s))  GLUCOSE, CAPILLARY     Status: Abnormal   Collection Time    07/02/12  9:19 PM      Result Value Range   Glucose-Capillary 422 (*) 70 - 99 mg/dL   Comment 1 Notify RN    GLUCOSE, CAPILLARY     Status: Abnormal   Collection Time    07/03/12  6:01 AM      Result Value Range   Glucose-Capillary 257 (*) 70 - 99 mg/dL   Psychological Evaluations:  Assessment:   AXIS I:  Substance Induced Mood Disorder and Cocaine dependence, Cannabis dependence AXIS II:  Deferred AXIS III:   Past Medical History  Diagnosis Date  . Diabetes mellitus   . Hypertension   . Seizures   . Pancreatitis   . Alcohol abuse   . Chronic abdominal pain   . Alcoholism /alcohol abuse   . Neuromuscular disorder 07/02/2012    Diabetic neuropathy  . Headache    AXIS IV:  economic problems, housing problems, occupational problems, problems with primary support group and Substance absue AXIS V:  11-20 some danger of hurting self or others possible OR occasionally fails to maintain minimal personal hygiene OR gross impairment in  communication  Treatment Plan/Recommendations: 1. Admit for crisis management and stabilization, estimated length of stay 3-5 days.  2. Medication management to reduce current symptoms to base line and improve the patient's overall level of functioning  3. Treat health problems as indicated.  4. Develop treatment plan to decrease risk of relapse upon discharge and the need for readmission.  5. Psycho-social education regarding relapse prevention and  self care.  6. Health care follow up as needed for medical problems.  7. Review, reconcile, and reinstate any pertinent home medications for other health issues where appropriate. 8. Call for consults with hospitalist for any additional specialty patient care services as needed.  Treatment Plan Summary: Daily contact with patient to assess and evaluate symptoms and progress in treatment Medication management Supportive approach/coping skills/relapse prevention Identify the need for detox, reassess and address the comorbidities Current Medications:  Current Facility-Administered Medications  Medication Dose Route Frequency Provider Last Rate Last Dose  . acetaminophen (TYLENOL) tablet 650 mg  650 mg Oral Q6H PRN Kerry Hough, PA-C      . alum & mag hydroxide-simeth (MAALOX/MYLANTA) 200-200-20 MG/5ML suspension 30 mL  30 mL Oral Q4H PRN Kerry Hough, PA-C      . chlordiazePOXIDE (LIBRIUM) capsule 25 mg  25 mg Oral TID Kerry Hough, PA-C   25 mg at 07/03/12 1610  . folic acid (FOLVITE) tablet 1 mg  1 mg Oral Daily Kerry Hough, PA-C   1 mg at 07/03/12 9604  . gabapentin (NEURONTIN) capsule 300 mg  300 mg Oral TID Kerry Hough, PA-C   300 mg at 07/03/12 5409  . insulin aspart (novoLOG) injection 0-15 Units  0-15 Units Subcutaneous TID WC Kerry Hough, PA-C   8 Units at 07/03/12 [phone]  . insulin aspart (novoLOG) injection 0-5 Units  0-5 Units Subcutaneous QHS Spencer E Simon, PA-C      . insulin glargine (LANTUS) injection 30 Units   30 Units Subcutaneous QHS Kerry Hough, PA-C   30 Units at 07/02/12 2234  . lipase/protease/amylase (CREON-12/PANCREASE) capsule 2 capsule  2 capsule Oral TID AC Kerry Hough, PA-C   2 capsule at 07/03/12 [phone]  . loperamide (IMODIUM) capsule 4 mg  4 mg Oral QID PRN Kerry Hough, PA-C   4 mg at 07/03/12 2956  . magnesium hydroxide (MILK OF MAGNESIA) suspension 30 mL  30 mL Oral Daily PRN Kerry Hough, PA-C      . mirtazapine (REMERON) tablet 15 mg  15 mg Oral QHS Kerry Hough, PA-C   15 mg at 07/02/12 2232  . nicotine (NICODERM CQ - dosed in mg/24 hours) patch 14 mg  14 mg Transdermal Q0600 Kerry Hough, PA-C      . ondansetron (ZOFRAN-ODT) disintegrating tablet 4 mg  4 mg Oral Q8H PRN Kerry Hough, PA-C      . phenytoin (DILANTIN) ER capsule 200 mg  200 mg Oral QHS Kerry Hough, PA-C   200 mg at 07/02/12 2231  . simvastatin (ZOCOR) tablet 10 mg  10 mg Oral q1800 Kerry Hough, PA-C      . thiamine (VITAMIN B-1) tablet 100 mg  100 mg Oral Daily Kerry Hough, PA-C   100 mg at 07/03/12 2130  . traZODone (DESYREL) tablet 50 mg  50 mg Oral QHS,MR X 1 Kerry Hough, PA-C   50 mg at 07/02/12 2232    Observation Level/Precautions:  15 minute checks  Laboratory:  Reviewed ED lab findings on file  Psychotherapy:  Group sessions  Medications:  See medication lists  Consultations:  As needed  Discharge Concerns:  Safety/sobriety  Estimated LOS: 3-5 days  Other:     I certify that inpatient services furnished can reasonably be expected to improve the patient's condition.   Armandina Stammer I, PMHNP 5/27/201410:21 AM

## 2012-07-03 NOTE — Progress Notes (Signed)
Recreation Therapy Notes  Date: 05.27.2014 Time: 3:15pm Location: 300 Hall Dayroom      Group Topic/Focus: Communication, Journalist, newspaper, Team Work  Participation Level: Active  Participation Quality: Appropriate, Attentive, Sharing and Supportive  Affect: Euthymic  Cognitive: Appropriate   Additional Comments: Activity: Flip Flop ; Explanation: Patients were asked to stand on a sheet, working together as a group patients were asked to flip the sheet over without stepping off the sheet.   Patient actively participated in group activity. Patient offered suggestions to group to accomplishing task. Patient participated in wrap up discussion about skills needed to complete activity. Patient shared personal stories with the group, as well as related to stories shared by his peers.   Marykay Lex Detrell Umscheid, LRT/CTRS  Jearl Klinefelter 07/03/2012 4:25 PM
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
## 2012-07-03 NOTE — Progress Notes (Signed)
Pt attended AA group in its entirety.  

## 2012-07-03 NOTE — [facility] Counselor (Signed)
Adult Comprehensive Assessment  Patient ID: Gabriel Rush, male   DOB: 15-Aug-1960, 52 y.o.   MRN: [id number]  Information Source: Information source: Patient  Current Stressors:  Educational / Learning stressors: 10th grade education Employment / Job issues: Unemployed Family Relationships: Sole survivor Surveyor, quantity / Lack of resources (include bankruptcy): No Income Housing / Lack of housing: Homeless, living in tent 2 months Physical health (include injuries & life threatening diseases): Pancreatitis and Diabetes Social relationships: None Substance abuse: History and current use Bereavement / Loss: Mother March 2014  Living/Environment/Situation:  Living Arrangements: Alone Living conditions (as described by patient or guardian): Tent How long has patient lived in current situation?: 2 months What is atmosphere in current home: Chaotic;Dangerous  Family History:  Marital status: Single Does patient have children?: No  Childhood History:  By whom was/is the patient raised?: Both parents Additional childhood history information: "I had an ideal family life" Description of patient's relationship with caregiver when they were a child: Good with both Patient's description of current relationship with people who raised him/her: Both Deceased Does patient have siblings?: Yes Number of Siblings: 2 Description of patient's current relationship with siblings: Both deceased Did patient suffer any verbal/emotional/physical/sexual abuse as a child?: No Did patient suffer from severe childhood neglect?: No Has patient ever been sexually abused/assaulted/raped as an adolescent or adult?: No Was the patient ever a victim of a crime or a disaster?: No Witnessed domestic violence?: No Has patient been effected by domestic violence as an adult?: No  Education:  Highest grade of school patient has completed: 10 th Currently a student?: No Learning disability?: No  Employment/Work  Situation:   Employment situation: Unemployed (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; panhandles and shoplifts) Patient's job has been impacted by current illness: No What is the longest time patient has a held a job?: 1 year Where was the patient employed at that time?: [employer] Has patient ever been in the [employer]?: No Has patient ever served in Buyer, retail?: No  Financial Resources:   Surveyor, quantity resources: No income (Panhandles and shoplifts) Does patient have a Lawyer or guardian?: No  Alcohol/Substance Abuse:   What has been your use of drugs/alcohol within the last 12 months?: Over the last 2 months (since mother passed) patient reports drinking 1 fifth vodka per day, 2-3 blunts THC if money will supply and $10 to $30 of crack cocaine if funds allow.  If attempted suicide, did drugs/alcohol play a role in this?:  (NA) Alcohol/Substance Abuse Treatment Hx: Denies past history Has alcohol/substance abuse ever caused legal problems?: No  Social Support System:   Patient's Community Support System: None Describe Community Support System: Only people I know are addicts also Type of faith/religion: Baptist How does patient's faith help to cope with current illness?: Helps me get up in the morning  Leisure/Recreation:   Leisure and Hobbies: None  Strengths/Needs:   What things does the patient do well?: "[place], eat once a day, survive" In what areas does patient struggle / problems for patient: "Consistency"  Discharge Plan:   Does patient have access to transportation?: No Plan for no access to transportation at discharge: Bus; uncertain Will patient be returning to same living situation after discharge?: No Plan for living situation after discharge: Patient requesting referral for inpatient treatment Currently receiving community mental health services: No If no, would patient like referral for services when discharged?: Yes (What county?) Medical sales representative) Does patient have  financial barriers related to discharge medications?: Yes Patient description of barriers  related to discharge medications: No Income  Summary/Recommendations:   Summary and Recommendations (to be completed by the evaluator): Patient is 52 YO single unemployed [place] American male admitted with diagnosis of Mood Disorder NOS, Alcohol Depressive Disorder NOS, and Cocaine Abuse. Patient would benefit from crisis stabilization, medication evaluation, therapy groups for processing thoughts/feelings/experiences, psycho ed groups for coping skills, and case management for discharge planning    Clide Dales. 07/03/2012

## 2012-07-03 NOTE — Progress Notes (Signed)
Patient ID: Gabriel Rush, male   DOB: Feb 06, 1961, 52 y.o.   MRN: [id number] He has been up and to groups interacting with peers and staff. Hehas c/o diarrhea twice today and has received  Imodium. His CBG at 12 noon was 455. NP informed and order obtained for him to have 20 units of novo log  aspart. It ws given as ordered. Self inventory: depression 8, Hopelessness 10. Si on and off and he has symptoms of withdrawals.

## 2012-07-04 DIAGNOSIS — F329 Major depressive disorder, single episode, unspecified: Secondary | ICD-10-CM

## 2012-07-04 DIAGNOSIS — F1020 Alcohol dependence, uncomplicated: Principal | ICD-10-CM

## 2012-07-04 LAB — GLUCOSE, CAPILLARY
Glucose-Capillary: 281 mg/dL — ABNORMAL HIGH (ref 70–99)
Glucose-Capillary: 358 mg/dL — ABNORMAL HIGH (ref 70–99)
Glucose-Capillary: 371 mg/dL — ABNORMAL HIGH (ref 70–99)

## 2012-07-04 MED ORDER — PANCRELIPASE (LIP-PROT-AMYL) 12000-38000 UNITS PO CPEP
3.0000 | ORAL_CAPSULE | Freq: Three times a day (TID) | ORAL | Status: DC
  Administered 2012-07-04 – 2012-07-09 (×15): 3 via ORAL
  Filled 2012-07-04 (×20): qty 3

## 2012-07-04 MED ORDER — TRAZODONE HCL 100 MG PO TABS
100.0000 mg | ORAL_TABLET | Freq: Every evening | ORAL | Status: DC | PRN
  Administered 2012-07-04 – 2012-07-07 (×4): 100 mg via ORAL
  Filled 2012-07-04 (×12): qty 1

## 2012-07-04 MED ORDER — LOPERAMIDE HCL 2 MG PO CAPS
2.0000 mg | ORAL_CAPSULE | ORAL | Status: DC | PRN
  Administered 2012-07-05 – 2012-07-09 (×8): 2 mg via ORAL

## 2012-07-04 MED ORDER — LOPERAMIDE HCL 2 MG PO CAPS: 4.0000 mg | ORAL_CAPSULE | Freq: Once | ORAL | Status: DC

## 2012-07-04 MED ORDER — NABUMETONE 500 MG PO TABS
500.0000 mg | ORAL_TABLET | ORAL | Status: AC
  Administered 2012-07-04 (×2): 500 mg via ORAL
  Filled 2012-07-04 (×3): qty 1

## 2012-07-04 MED ORDER — NABUMETONE 500 MG PO TABS
500.0000 mg | ORAL_TABLET | Freq: Two times a day (BID) | ORAL | Status: DC
  Administered 2012-07-05 – 2012-07-09 (×9): 500 mg via ORAL
  Filled 2012-07-04 (×14): qty 1

## 2012-07-04 MED ORDER — LOPERAMIDE HCL 2 MG PO CAPS
4.0000 mg | ORAL_CAPSULE | Freq: Once | ORAL | Status: AC
  Administered 2012-07-04: 4 mg via ORAL

## 2012-07-04 NOTE — Progress Notes (Signed)
Patient ID: Gabriel Rush, male   DOB: January 25, 1961, 52 y.o.   MRN: [id number] Reviewed labs and chart. RN approached regarding this patient's elevated blood sugar and I agree that 15 units of Novalog is appropriate at this time.  I have also called the Diabetic Educator regarding this man's medication and the need for incremental increases in Lantus as well as review of medication management to assist this patient in achieving better glycemic control prior to and upon discharge. Rona Ravens. Mamie Diiorio RPAC 5:54 PM 07/04/2012

## 2012-07-04 NOTE — Progress Notes (Signed)
Patient ID: Gabriel Rush, male   DOB: 10-16-1960, 52 y.o.   MRN: [id number] D: Pt. Visible on unit in dayroom watching TV. Pt. Reports wants long term treatment, "not ready to go back on those streets, I won't make it". A: Writer introduced self to client, encouraged him to make up his mind to move forward. Writer also talked to client about making better food choices. Pt. Will be monitored q67min for safety. Staff encouraged group. R: Pt. Is safe on the unit. Pt. Attended group.

## 2012-07-04 NOTE — [facility] Group Notes (Signed)
[facility] LCSW Group Therapy  07/04/2012  1:15 PM  Type of Therapy: Group Therapy 1:15 to 2:30 PM  Participation Level:  Active  Participation Quality: Appropriate  Affect: Appropriate  Cognitive:  Appropriated  Insight: Developing  Engagement in Therapy:  Engaged  Modes of Intervention:  Discussion, exploration, socialization and support  Summary of Progress/Problems: The focus of this group session was to process how we deal with difficult emotions and share with others the patterns that play out when we are reacting to the emotion verses the situation.  Gabriel Rush shared that one of the most difficult emotions he deals with is hopelessness. "I wake up to homelessness and the feelings are unbearable, in order to deal with it I have to numb completely out."   Patient shared at end of session that he felt "hospitalization is not what I expected, this is good and helpful."   Harrill, [name]

## 2012-07-04 NOTE — Progress Notes (Signed)
Patient ID: Gabriel Rush, male   DOB: 02-12-1960, 52 y.o.   MRN: [id number]  D: Pt smiled during his approach to the writer. Informed the writer that it had been a good day. Spoke to Retail banker about his elevated blood sugars, stating "they don't have anything for me to eat". Pt was referring to meal provided in the cafe. However, pt stated "I've got to learn to control myself. It's been a long time since I've had 3 hot meals".  Writer encouraged pt to make better choices. Also informed of increase in lantus.  A:  Support and encouragement was offered. 15 min checks continued for safety.  R: Pt remains safe.

## 2012-07-04 NOTE — [facility] Group Notes (Signed)
[facility] LCSW Aftercare Discharge Planning Group Note   07/04/2012 8:45 AM  Participation Quality:  Appropriate  Mood/Affect:  Anxious, patient reports multiple concerns regarding lack of diabetic choices for food  Depression Rating:  5  Anxiety Rating:  11  Thoughts of Suicide:  No Will you contract for safety?   NA  Current AVH:  No  Plan for Discharge/Comments:  Patient interested in follow up at treatment center; CSW to call [facility] and [facility]  Transportation Means:  Uncertain  Supports: None  Harrill, [name]

## 2012-07-04 NOTE — Progress Notes (Signed)
[facility] MD Progress Note  07/04/2012 2:19 PM Gabriel Rush  MRN:  [id number]  Subjective:  Gabriel Rush reports that he is not sleeping well. Complained of tossing and turning most of the night. complained of having to go to the bathroom a lot today due to diarrhea problems. Admits feeling restless and anxious. Rated depression at #5 and anxiety at 7. Denies SIHI, AVH.  Diagnosis:   Axis I:  Alcohol dependence, Major depression Axis II: Deferred Axis III:  Past Medical History  Diagnosis Date  . Diabetes mellitus   . Hypertension   . Seizures   . Pancreatitis   . Alcohol abuse   . Chronic abdominal pain   . Alcoholism /alcohol abuse   . Neuromuscular disorder 07/02/2012    Diabetic neuropathy  . Headache(784.0)    Axis IV: other psychosocial or environmental problems and Alcoholism Axis V: Seriou symptoms.  ADL's:  Fairly intact.  Sleep: "I'm not sleeping well"  Appetite:  Fair  Suicidal Ideation:  Plan:  Denies Intent:  Denies Means:  Denies  Homicidal Ideation:  Plan:  Denies  Intent:  Denies Means:  Denies  AEB (as evidenced by): Per patient's reports.  Psychiatric Specialty Exam: Review of Systems  Constitutional: Negative.   HENT: Negative.   Eyes: Negative.   Respiratory: Negative.   Cardiovascular: Negative.   Gastrointestinal: Positive for abdominal pain and diarrhea.  Genitourinary: Negative.   Skin: Negative.   Neurological: Negative.   Endo/Heme/Allergies: Negative.   Psychiatric/Behavioral: Positive for substance abuse (Hx alcoholism, Cocaine abuse). Negative for depression, suicidal ideas, hallucinations and memory loss. The patient is nervous/anxious (Currently being stabilized with mediaction) and has insomnia (Currently being stabilized with mediaction).     Blood pressure 149/99, pulse 76, temperature 98.1 F (36.7 C), temperature source Oral, resp. rate 20, height 5\' 9"  (1.753 m), weight 80.74 kg (178 lb).Body mass index is 26.27 kg/(m^2).  General  Appearance: Fairly Groomed  Patent attorney::  Good  Speech:  Clear and Coherent  Volume:  normal  Mood:  Anxious and Depressed  Affect:  Restricted  Thought Process:  Coherent and Goal Directed  Orientation:  Full (Time, Place, and Person)  Thought Content:  Rumination  Suicidal Thoughts:  No  Homicidal Thoughts:  No  Memory:  Immediate;   Good Recent;   Good Remote;   Good  Judgement:  Fair  Insight:  Fair  Psychomotor Activity:  Restlessness and having to use the bathroom a lot today.  Concentration:  Fair  Recall:  Good  Akathisia:  No  Handed:  Right  AIMS (if indicated):     Assets:  Desire for Improvement  Sleep:  Number of Hours: 3.25   Current Medications: Current Facility-Administered Medications  Medication Dose Route Frequency Provider Last Rate Last Dose  . acetaminophen (TYLENOL) tablet 650 mg  650 mg Oral Q6H PRN Gabriel Hough, PA-C      . alum & mag hydroxide-simeth (MAALOX/MYLANTA) 200-200-20 MG/5ML suspension 30 mL  30 mL Oral Q4H PRN Gabriel Hough, PA-C      . chlordiazePOXIDE (LIBRIUM) capsule 25 mg  25 mg Oral TID Gabriel Hough, PA-C   25 mg at 07/04/12 1203  . folic acid (FOLVITE) tablet 1 mg  1 mg Oral Daily Gabriel Hough, PA-C   1 mg at 07/04/12 0831  . gabapentin (NEURONTIN) capsule 300 mg  300 mg Oral TID Gabriel Hough, PA-C   300 mg at 07/04/12 1202  . insulin aspart (novoLOG) injection 0-15 Units  0-15 Units Subcutaneous TID WC Gabriel Hough, PA-C   8 Units at 07/04/12 1206  . insulin aspart (novoLOG) injection 0-5 Units  0-5 Units Subcutaneous QHS Gabriel Hough, PA-C   5 Units at 07/03/12 2237  . insulin glargine (LANTUS) injection 36 Units  36 Units Subcutaneous QHS Gabriel Hough, PA-C   36 Units at 07/03/12 2236  . lipase/protease/amylase (CREON-12/PANCREASE) capsule 3 capsule  3 capsule Oral TID AC Gabriel Fee, MD      . loperamide (IMODIUM) capsule 2 mg  2 mg Oral PRN Gabriel Kava, NP      . magnesium hydroxide (MILK OF MAGNESIA)  suspension 30 mL  30 mL Oral Daily PRN Gabriel Hough, PA-C      . mirtazapine (REMERON) tablet 15 mg  15 mg Oral QHS Gabriel Hough, PA-C   15 mg at 07/03/12 2236  . [START ON 07/05/2012] nabumetone (RELAFEN) tablet 500 mg  500 mg Oral BID Gabriel Fee, MD      . nabumetone (RELAFEN) tablet 500 mg  500 mg Oral BH-qamhs Gabriel Fee, MD      . ondansetron (ZOFRAN-ODT) disintegrating tablet 4 mg  4 mg Oral Q8H PRN Gabriel Hough, PA-C      . phenytoin (DILANTIN) ER capsule 200 mg  200 mg Oral QHS Gabriel Hough, PA-C   200 mg at 07/03/12 2236  . simvastatin (ZOCOR) tablet 10 mg  10 mg Oral q1800 Gabriel Hough, PA-C      . thiamine (VITAMIN B-1) tablet 100 mg  100 mg Oral Daily Gabriel Hough, PA-C   100 mg at 07/04/12 0831  . traZODone (DESYREL) tablet 50 mg  50 mg Oral QHS,MR X 1 Gabriel Hough, PA-C   50 mg at 07/03/12 2236    Lab Results:  Results for orders placed during the hospital encounter of 07/02/12 (from the past 48 hour(s))  GLUCOSE, CAPILLARY     Status: Abnormal   Collection Time    07/02/12  9:19 PM      Result Value Range   Glucose-Capillary 422 (*) 70 - 99 mg/dL   Comment 1 Notify RN    GLUCOSE, CAPILLARY     Status: Abnormal   Collection Time    07/03/12  6:01 AM      Result Value Range   Glucose-Capillary 257 (*) 70 - 99 mg/dL  HEMOGLOBIN J1B     Status: Abnormal   Collection Time    07/03/12  6:20 AM      Result Value Range   Hemoglobin A1C 13.6 (*) <5.7 %   Comment: (NOTE)                                                                               According to the ADA Clinical Practice Recommendations for 2011, when     HbA1c is used as a screening test:      >=6.5%   Diagnostic of Diabetes Mellitus               (if abnormal result is confirmed)     5.7-6.4%   Increased risk of developing Diabetes Mellitus  References:Diagnosis and Classification of Diabetes Mellitus,Diabetes     Care,2011,34(Suppl 1):S62-S69 and Standards of Medical Care in              Diabetes - 2011,Diabetes Care,2011,34 (Suppl 1):S11-S61.   Mean Plasma Glucose 344 (*) <117 mg/dL  GLUCOSE, CAPILLARY     Status: Abnormal   Collection Time    07/03/12 11:57 AM      Result Value Range   Glucose-Capillary 455 (*) 70 - 99 mg/dL   Comment 1 Notify RN    GLUCOSE, CAPILLARY     Status: Abnormal   Collection Time    07/03/12  5:06 PM      Result Value Range   Glucose-Capillary 298 (*) 70 - 99 mg/dL   Comment 1 Notify RN    GLUCOSE, CAPILLARY     Status: Abnormal   Collection Time    07/03/12  9:25 PM      Result Value Range   Glucose-Capillary 397 (*) 70 - 99 mg/dL   Comment 1 Notify RN    GLUCOSE, CAPILLARY     Status: Abnormal   Collection Time    07/04/12  6:19 AM      Result Value Range   Glucose-Capillary 279 (*) 70 - 99 mg/dL   Comment 1 Notify RN    GLUCOSE, CAPILLARY     Status: Abnormal   Collection Time    07/04/12 11:54 AM      Result Value Range   Glucose-Capillary 281 (*) 70 - 99 mg/dL   Comment 1 Notify RN      Physical Findings: AIMS: Facial and Oral Movements Muscles of Facial Expression: None, normal Lips and Perioral Area: None, normal Jaw: None, normal Tongue: None, normal,Extremity Movements Upper (arms, wrists, hands, fingers): None, normal Lower (legs, knees, ankles, toes): None, normal, Trunk Movements Neck, shoulders, hips: None, normal, Overall Severity Severity of abnormal movements (highest score from questions above): None, normal Incapacitation due to abnormal movements: None, normal Patient's awareness of abnormal movements (rate only patient's report): No Awareness,    CIWA:  CIWA-Ar Total: 0 COWS:     Treatment Plan Summary: Daily contact with patient to assess and evaluate symptoms and progress in treatment Medication management  Plan: Supportive approach/coping skills/relapse prevention. Imodium 4 mg once, then 2 mg prn for loose stools. Increased Trazodone from 50 mg to 100 mg Q bedtime. Encouraged fluid  intake. Encouraged out of room, participation in group sessions and application of coping skills when distressed. Will continue to monitor response to/adverse effects of medications in use to assure effectiveness. Continue to monitor mood, behavior and interaction with staff and other patients. Continue current plan of care.  Medical Decision Making Problem Points:  New problem, with additional work-up planned (4), Review of last therapy session (1) and Review of psycho-social stressors (1) Data Points:  Review of medication regiment & side effects (2) Review of new medications or change in dosage (2)  I certify that inpatient services furnished can reasonably be expected to improve the patient's condition.   Armandina Stammer I, PMHNP- [employer] 07/04/2012, 2:19 PM Agree with assessment and treatment plan Will continue to work to identify suitable placement once discharged

## 2012-07-04 NOTE — Progress Notes (Signed)
D: Patient's affect is appropriate to circumstance and mood is anxious. He reported on the self inventory sheet that his sleep is fair, appetite/ability to pay attention is improving and energy level is normal. Patient rated depression and feelings of hopelessness "7". He's attending groups and compliant with medication regimen. Patient is interacting with peers in the milieu. Patient's blood sugar around dinner was elevated; spoke to PA, Verne Spurr regarding the patient's results; patient received 15 units of insulin and was instructed to recheck in an hour and recheck again in two hours.  A: Support and encouragement provided to patient. Administered scheduled medications per ordering MD. Monitor Q15 minute checks for safety.  R: Patient receptive. Denies SI/HI/AVH. Patient remains safe on the unit.

## 2012-07-04 NOTE — Tx Team (Signed)
Interdisciplinary Treatment Plan Update (Adult)  Date: 07/04/2012  Time Reviewed: 9:56 AM   Progress in Treatment: Attending groups: Yes Participating in groups: Yes Taking medication as prescribed:  Yes Tolerating medication:  Yes Family/Significant othe contact made: No, no one to contact Patient understands diagnosis: Yes Discussing patient identified problems/goals with staff: Yes Medical problems stabilized or resolved:  Yes Denies suicidal/homicidal ideation: Yes Patient has not harmed self or Others: Yes  New problem(s) identified: None Identified  Discharge Plan or Barriers:  CSW to refer patient to [facility] and [facility]  Additional comments: N/A  Reason for Continuation of Hospitalization:  Anxiety Depression Withdrawal Sypmtoms  Estimated length of stay: 2-3 days  For review of initial/current patient goals, please see plan of care.  Attendees: Patient:     Family:     Physician:  Geoffery Lyons 07/04/2012 9:56 AM   Nursing:   Robbie Louis, RN 07/04/2012 9:56 AM   Clinical Social Worker Ronda Fairly 07/04/2012 9:56 AM   Other:  Liliane Bade, Community Care Coordinator 07/04/2012 9:56 AM   Other:  Harold Barban, RN 07/04/2012 9:56 AM   Other:  Serena Colonel, NP 07/04/2012 9:56 AM   Other:   07/04/2012 9:56 AM    Scribe for Treatment Team:   Carney Bern, LCSWA  07/04/2012 9:56 AM

## 2012-07-05 DIAGNOSIS — F411 Generalized anxiety disorder: Secondary | ICD-10-CM

## 2012-07-05 LAB — GLUCOSE, CAPILLARY: Glucose-Capillary: 371 mg/dL — ABNORMAL HIGH (ref 70–99)

## 2012-07-05 LAB — PHENYTOIN LEVEL, FREE AND TOTAL: Phenytoin, Free: <0.5 mg/L (ref 1.0–2.0)

## 2012-07-05 MED ORDER — POTASSIUM CHLORIDE CRYS ER 20 MEQ PO TBCR: 20.0000 meq | EXTENDED_RELEASE_TABLET | Freq: Two times a day (BID) | ORAL | Status: DC

## 2012-07-05 MED ORDER — LISINOPRIL 10 MG PO TABS
10.0000 mg | ORAL_TABLET | Freq: Every day | ORAL | Status: DC
  Administered 2012-07-05 – 2012-07-09 (×5): 10 mg via ORAL
  Filled 2012-07-05: qty 7
  Filled 2012-07-05 (×6): qty 1

## 2012-07-05 MED ORDER — POTASSIUM CHLORIDE CRYS ER 20 MEQ PO TBCR
20.0000 meq | EXTENDED_RELEASE_TABLET | Freq: Two times a day (BID) | ORAL | Status: AC
  Administered 2012-07-05 – 2012-07-07 (×6): 20 meq via ORAL
  Filled 2012-07-05 (×7): qty 1

## 2012-07-05 MED ORDER — INSULIN ASPART 100 UNIT/ML ~~LOC~~ SOLN
7.0000 [IU] | Freq: Once | SUBCUTANEOUS | Status: AC
  Administered 2012-07-05: 7 [IU] via SUBCUTANEOUS

## 2012-07-05 NOTE — Progress Notes (Signed)
[facility] MD Progress Note  07/05/2012 4:06 PM Gabriel Rush  MRN:  [id number] Subjective:  Gabriel Rush continues to have a hard time. He has multiple physical symptoms, his glucose is still out of control. He endorses he is having a lot of cravings for alcohol. States that if he was outside, he would be drinking by now. Concerned as this starts the cycle of alcohol, uncontrolled diabetes, medical inpatient management Diagnosis:  Major Depression, Anxiety Disorder NOS, Alcohol Dependence  ADL's:  Intact  Sleep: Poor  Appetite:  Poor  Suicidal Ideation:  Plan:  denies Intent:  denies Means:  denies Homicidal Ideation:  Plan:  denies Intent:  denies Means:  denies AEB (as evidenced by):  Psychiatric Specialty Exam: Review of Systems  Constitutional: Positive for malaise/fatigue.  HENT: Negative.   Eyes: Negative.   Respiratory: Negative.   Cardiovascular: Negative.   Gastrointestinal: Positive for nausea and diarrhea.  Genitourinary: Negative.   Musculoskeletal: Positive for myalgias, back pain and joint pain.  Skin: Negative.   Neurological: Positive for weakness.  Endo/Heme/Allergies: Negative.   Psychiatric/Behavioral: Positive for substance abuse. The patient is nervous/anxious and has insomnia.     Blood pressure 114/78, pulse 98, temperature 98.3 F (36.8 C), temperature source Oral, resp. rate 16, height 5\' 9"  (1.753 m), weight 80.74 kg (178 lb).Body mass index is 26.27 kg/(m^2).  General Appearance: Disheveled  Eye Solicitor::  Fair  Speech:  Clear and Coherent and Slow  Volume:  Decreased  Mood:  Anxious and Depressed  Affect:  Depressed and anxious  Thought Process:  Coherent and Goal Directed  Orientation:  Full (Time, Place, and Person)  Thought Content:  worries, concerns  Suicidal Thoughts:  Yes.  without intent/plan, when he thinks about his situation, the uncertainty his medical issues  Homicidal Thoughts:  No  Memory:  Immediate;   Fair Recent;   Fair Remote;    Fair  Judgement:  Fair  Insight:  Present  Psychomotor Activity:  Restlessness  Concentration:  Fair  Recall:  Fair  Akathisia:  No  Handed:  Right  AIMS (if indicated):     Assets:  Desire for Improvement  Sleep:  Number of Hours: 6.5   Current Medications: Current Facility-Administered Medications  Medication Dose Route Frequency Provider Last Rate Last Dose  . acetaminophen (TYLENOL) tablet 650 mg  650 mg Oral Q6H PRN Kerry Hough, PA-C      . alum & mag hydroxide-simeth (MAALOX/MYLANTA) 200-200-20 MG/5ML suspension 30 mL  30 mL Oral Q4H PRN Kerry Hough, PA-C      . chlordiazePOXIDE (LIBRIUM) capsule 25 mg  25 mg Oral TID Kerry Hough, PA-C   25 mg at 07/05/12 1212  . folic acid (FOLVITE) tablet 1 mg  1 mg Oral Daily Kerry Hough, PA-C   1 mg at 07/05/12 0810  . gabapentin (NEURONTIN) capsule 300 mg  300 mg Oral TID Kerry Hough, PA-C   300 mg at 07/05/12 1212  . insulin aspart (novoLOG) injection 0-15 Units  0-15 Units Subcutaneous TID WC Kerry Hough, PA-C   15 Units at 07/05/12 1212  . insulin aspart (novoLOG) injection 0-5 Units  0-5 Units Subcutaneous QHS Kerry Hough, PA-C   5 Units at 07/04/12 2237  . insulin glargine (LANTUS) injection 36 Units  36 Units Subcutaneous QHS Kerry Hough, PA-C   36 Units at 07/04/12 2239  . lipase/protease/amylase (CREON-12/PANCREASE) capsule 3 capsule  3 capsule Oral TID AC Rachael Fee, MD  3 capsule at 07/05/12 1211  . lisinopril (PRINIVIL,ZESTRIL) tablet 10 mg  10 mg Oral Daily Sanjuana Kava, NP   10 mg at 07/05/12 1211  . loperamide (IMODIUM) capsule 2 mg  2 mg Oral PRN Sanjuana Kava, NP   2 mg at 07/05/12 1004  . magnesium hydroxide (MILK OF MAGNESIA) suspension 30 mL  30 mL Oral Daily PRN Kerry Hough, PA-C      . mirtazapine (REMERON) tablet 15 mg  15 mg Oral QHS Kerry Hough, PA-C   15 mg at 07/04/12 2235  . nabumetone (RELAFEN) tablet 500 mg  500 mg Oral BID Rachael Fee, MD   500 mg at 07/05/12 0809  .  ondansetron (ZOFRAN-ODT) disintegrating tablet 4 mg  4 mg Oral Q8H PRN Kerry Hough, PA-C      . phenytoin (DILANTIN) ER capsule 200 mg  200 mg Oral QHS Kerry Hough, PA-C   200 mg at 07/04/12 2234  . potassium chloride SA (K-DUR,KLOR-CON) CR tablet 20 mEq  20 mEq Oral BID Sanjuana Kava, NP   20 mEq at 07/05/12 1056  . simvastatin (ZOCOR) tablet 10 mg  10 mg Oral q1800 Kerry Hough, PA-C   10 mg at 07/04/12 1723  . thiamine (VITAMIN B-1) tablet 100 mg  100 mg Oral Daily Kerry Hough, PA-C   100 mg at 07/05/12 0809  . traZODone (DESYREL) tablet 100 mg  100 mg Oral QHS,MR X 1 Sanjuana Kava, NP   100 mg at 07/04/12 2235    Lab Results:  Results for orders placed during the hospital encounter of 07/02/12 (from the past 48 hour(s))  GLUCOSE, CAPILLARY     Status: Abnormal   Collection Time    07/03/12  5:06 PM      Result Value Range   Glucose-Capillary 298 (*) 70 - 99 mg/dL   Comment 1 Notify RN    GLUCOSE, CAPILLARY     Status: Abnormal   Collection Time    07/03/12  9:25 PM      Result Value Range   Glucose-Capillary 397 (*) 70 - 99 mg/dL   Comment 1 Notify RN    GLUCOSE, CAPILLARY     Status: Abnormal   Collection Time    07/04/12  6:19 AM      Result Value Range   Glucose-Capillary 279 (*) 70 - 99 mg/dL   Comment 1 Notify RN    GLUCOSE, CAPILLARY     Status: Abnormal   Collection Time    07/04/12 11:54 AM      Result Value Range   Glucose-Capillary 281 (*) 70 - 99 mg/dL   Comment 1 Notify RN    GLUCOSE, CAPILLARY     Status: Abnormal   Collection Time    07/04/12  5:11 PM      Result Value Range   Glucose-Capillary 421 (*) 70 - 99 mg/dL  GLUCOSE, CAPILLARY     Status: Abnormal   Collection Time    07/04/12  6:51 PM      Result Value Range   Glucose-Capillary 358 (*) 70 - 99 mg/dL   Comment 1 Notify RN    GLUCOSE, CAPILLARY     Status: Abnormal   Collection Time    07/04/12  9:21 PM      Result Value Range   Glucose-Capillary 371 (*) 70 - 99 mg/dL   GLUCOSE, CAPILLARY     Status: Abnormal   Collection Time  07/05/12  6:05 AM      Result Value Range   Glucose-Capillary 258 (*) 70 - 99 mg/dL  GLUCOSE, CAPILLARY     Status: Abnormal   Collection Time    07/05/12 12:01 PM      Result Value Range   Glucose-Capillary 374 (*) 70 - 99 mg/dL    Physical Findings: AIMS: Facial and Oral Movements Muscles of Facial Expression: None, normal Lips and Perioral Area: None, normal Jaw: None, normal Tongue: None, normal,Extremity Movements Upper (arms, wrists, hands, fingers): None, normal Lower (legs, knees, ankles, toes): None, normal, Trunk Movements Neck, shoulders, hips: None, normal, Overall Severity Severity of abnormal movements (highest score from questions above): None, normal Incapacitation due to abnormal movements: None, normal Patient's awareness of abnormal movements (rate only patient's report): No Awareness, Dental Status Current problems with teeth and/or dentures?: No Does patient usually wear dentures?: No  CIWA:  CIWA-Ar Total: 0 COWS:     Treatment Plan Summary: Daily contact with patient to assess and evaluate symptoms and progress in treatment Medication management  Plan:  Supportive approach/coping skills/relapse prevention            Continue the detox/reassess and address the comorbidities  Medical Decision Making Problem Points:  Review of last therapy session (1) and Review of psycho-social stressors (1) Data Points:  Review of medication regiment & side effects (2) Review of new medications or change in dosage (2)  I certify that inpatient services furnished can reasonably be expected to improve the patient's condition.   Gabriel Rush A 07/05/2012, 4:06 PM

## 2012-07-05 NOTE — Progress Notes (Signed)
Adult Psychoeducational Group Note  Date:  07/05/2012 Time:  1:58 PM  Group Topic/Focus:  Overcoming Stress:   The focus of this group is to define stress and help patients assess their triggers.  Participation Level:  Active  Participation Quality:  Appropriate, Attentive, Sharing and Supportive  Affect:  Appropriate  Cognitive:  Alert and Appropriate  Insight: Appropriate  Engagement in Group:  Engaged  Modes of Intervention:  Discussion and Education  Additional Comments:  Pt attended group.  Shelly Bombard D 07/05/2012, 1:58 PM

## 2012-07-05 NOTE — [facility] Group Notes (Signed)
[facility] LCSW Group Therapy  07/05/2012  1:15 PM   Type of Therapy:  Group Therapy  Participation Level:  Active  Participation Quality:  Appropriate and Attentive  Affect:  Appropriate  Cognitive:  Alert and Appropriate  Insight:  Developing/Improving and Engaged  Engagement in Therapy:  Developing/Improving and Engaged  Modes of Intervention:  Activity, Clarification, Confrontation, Discussion, Education, Exploration, Limit-setting, Orientation, Problem-solving, Rapport Building, Dance movement psychotherapist, Socialization and Support  Summary of Progress/Problems: The topic for group was balance in life.  Pt participated in the discussion about when their life was in balance and out of balance and how this feels.  Pt discussed ways to get back in balance and short term goals they can work on to get where they want to be.  Pt than actively participated in group activity in which they chose two cards with a picture on it which they felt represented balance in their life and life being unbalanced.  Pt was able to process that his life would feel more balanced if he had money because it would help keep him out of trouble.  Pt also processed the fact that he no longer knows what his hobbies are and would like to get back past hobbies.  Pt actively participated in group discussion.    Reyes Ivan, [place] 07/05/2012 2:44 PM

## 2012-07-05 NOTE — Progress Notes (Signed)
Recreation Therapy Notes  Date: 05.29.2014 Time: 3:00pm  Location: [address] Dayroom  Group Topic/Focus: Stress Managment   Participation Level:  Active   Participation Quality:  Appropriate   Affect:  Euthymic   Cognitive:  Appropriate   Additional Comments: Activity: Guided Imagery & Progressive Muscle Relaxation; Explanation: Patients listened to Guided Imagery Script. LRT instructed patients on completing Progressive Muscle Relaxation.  Patient actively participated in Guided Imagery. Patient exited day room following Guided Imagery and did not return to group session.   Jearl Klinefelter, LRT/ CTRS  Jearl Klinefelter 07/05/2012 4:26 PM

## 2012-07-05 NOTE — Progress Notes (Signed)
D: Patient appropriate and cooperative with staff and peers. Patient's affect/mood is anxious. He reported on the self inventory sheet that his sleep is fair, appetite/ability to pay attention is improving and energy level is low. Patient rated depression and feelings of hopelessness "6". He reported that changes he plans to make to better care for self are to take medications and stay active.  A: Support and encouragement provided to patient. Scheduled medications administered per MD orders. Maintain Q15 minute checks for safety.  R: Patient receptive. Denies SI/HI/AVH. Patient remains safe.

## 2012-07-05 NOTE — [facility] Group Notes (Signed)
[facility] LCSW Aftercare Discharge Planning Group Note   07/04/2012 8:45 AM  Participation Quality:  Appropriate  Mood/Affect:  Anxious, reports if rating aggitation it would be an 8  Depression Rating: 5  Anxiety Rating:  8  Thoughts of Suicide:  No Will you contract for safety?   NA  Current AVH:  No  Plan for Discharge/Comments:  Patient interested in follow up at treatment center; CSW to contact [facility] as [facility] has no [employer]:  Uncertain  Supports: None  Dyane Dustman, [name]

## 2012-07-05 NOTE — Progress Notes (Addendum)
Patient ID: Gabriel Rush, male   DOB: [birth year], 52 y.o.   MRN: [id number]  D: At 2200 tonight patient's blood sugar elevated to 521. On-call notified. Patient is asymptomatic and has been non-compliant with diet. Seen eating ice cream and drinking Lemonade at [employer]. Given a pitcher of water and encouraged to force fluids. Lantus and extra novolog given at bedtime. Note left in physician sticky section for his regular physician. Patient states he really doesn't want to go on an insulin drip. Currently denies any SI at this time. A: Staff will monitor on q 15 minutes, follow treatment plan, and give meds. R: Took meds without issue.

## 2012-07-06 DIAGNOSIS — F10239 Alcohol dependence with withdrawal, unspecified: Secondary | ICD-10-CM

## 2012-07-06 DIAGNOSIS — R197 Diarrhea, unspecified: Secondary | ICD-10-CM

## 2012-07-06 DIAGNOSIS — E1100 Type 2 diabetes mellitus with hyperosmolarity without nonketotic hyperglycemic-hyperosmolar coma (NKHHC): Secondary | ICD-10-CM

## 2012-07-06 DIAGNOSIS — F10939 Alcohol use, unspecified with withdrawal, unspecified: Secondary | ICD-10-CM

## 2012-07-06 LAB — GLUCOSE, CAPILLARY
Glucose-Capillary: 521 mg/dL — ABNORMAL HIGH (ref 70–99)
Glucose-Capillary: 289 mg/dL — ABNORMAL HIGH (ref 70–99)

## 2012-07-06 MED ORDER — INSULIN ASPART 100 UNIT/ML ~~LOC~~ SOLN
6.0000 [IU] | Freq: Three times a day (TID) | SUBCUTANEOUS | Status: DC
  Administered 2012-07-06 – 2012-07-09 (×9): 6 [IU] via SUBCUTANEOUS

## 2012-07-06 MED ORDER — INSULIN GLARGINE 100 UNIT/ML ~~LOC~~ SOLN
40.0000 [IU] | Freq: Every day | SUBCUTANEOUS | Status: DC
  Administered 2012-07-06: 40 [IU] via SUBCUTANEOUS

## 2012-07-06 NOTE — Progress Notes (Signed)
Patient did attend the evening karaoke group. Pt was engaged and supportive.   

## 2012-07-06 NOTE — Consult Note (Signed)
[facility] Medical Consultation  Gabriel Rush HQI:[id number] DOB: 04-15-1960 DOA: 07/02/2012 PCP: No PCP Per Patient   Requesting physician: behav health Date of consultation: 5/30 Reason for consultation: DM  Impression/Recommendations Active Problems:   DEPRESSION   Alcohol dependence    1. DM- Hgb A1C >10; will increase lantus, novolog, increase SSI, check BMP in AM- increased calories on diet and asked for diabetic snacks to be made available, holding metformin 2. Diarrhea- > 3 months- check stool studies, ? Related to pancreatic insuff from alcohol 3. Hypokalemia- recheck BMP 4. Will need PCP at D/C  I will followup again tomorrow. Please contact me if I can be of assistance in the meanwhile. Thank you for this consultation.  Chief Complaint: elevated BS  HPI:  52 yo with DM for at least 10 years on insulin the entire time.  Has had periods off when he was homeless but denies missing doses before admission to [facility].  HgbA1C was found to be > 10 upon admission.  He has been eating and drinking non diabetic items (lemonade/icecream). BS here have been > 500.  Patient says he is not getting enough to eat.  Wants diabetic snacks.  Also mentions diarrhea- has been going on for a long time.  Describes like water. No blood  No CP, no SOB, no fever, no chills   Review of Systems:  All systems reviewed, negative unless stated above  Past Medical History  Diagnosis Date  . Diabetes mellitus   . Hypertension   . Seizures   . Pancreatitis   . Alcohol abuse   . Chronic abdominal pain   . Alcoholism /alcohol abuse   . Neuromuscular disorder 07/02/2012    Diabetic neuropathy  . [id number](272.5)    Past Surgical History  Procedure Laterality Date  . Appendectomy     Social History:  reports that he quit smoking about 27 years ago. His smoking use included Cigarettes. He smoked 0.00 packs per day. He has never used smokeless tobacco. He reports that  drinks alcohol. He  reports that he uses illicit drugs (Marijuana and "Crack" cocaine).  Allergies  Allergen Reactions  . Morphine And Related Itching   Family History  Problem Relation Age of Onset  . Hypertension      Prior to Admission medications   Medication Sig Start Date End Date Taking? Authorizing Provider  amylase-lipase-protease (LIPRAM-CR10) 33.03-20-35.5 MU per capsule Take 2 capsules by mouth 3 (three) times daily with meals. 06/15/12  Yes Gabriel Shipper, MD  chlordiazePOXIDE (LIBRIUM) 10 MG capsule Take 10 mg by mouth 3 (three) times daily.   Yes Historical Provider, MD  gabapentin (NEURONTIN) 300 MG capsule Take 1 capsule (300 mg total) by mouth 3 (three) times daily. 06/15/12  Yes Gabriel Shipper, MD  insulin aspart (NOVOLOG) 100 UNIT/ML injection Inject 0-10 Units into the skin See admin instructions. 4 times daily before each meal, 140-199 - 2 units, 200-250 - 4 units, 251-299 - 6 units, 300-349 - 8 units, 350 or above 10 units   Yes Historical Provider, MD  insulin glargine (LANTUS) 100 UNIT/ML injection Inject 30 Units into the skin at bedtime. Was taking this as a home med.   Yes Historical Provider, MD  loperamide (IMODIUM) 2 MG capsule Take 2 capsules (4 mg total) by mouth 4 (four) times daily as needed for diarrhea or loose stools. 06/15/12  Yes Gabriel Shipper, MD  metFORMIN (GLUCOPHAGE) 500 MG tablet Take 500 mg by mouth 2 (two) times daily with a meal.  Yes Historical Provider, MD  mirtazapine (REMERON) 15 MG tablet Take 1 tablet (15 mg total) by mouth at bedtime. 06/15/12  Yes Gabriel Shipper, MD  ondansetron (ZOFRAN ODT) 4 MG disintegrating tablet Take 1 tablet (4 mg total) by mouth every 8 (eight) hours as needed for nausea. 06/15/12  Yes Gabriel Shipper, MD  oxyCODONE (OXY IR/ROXICODONE) 5 MG immediate release tablet Take 1 tablet (5 mg total) by mouth every 6 (six) hours as needed for pain. 06/15/12  Yes Gabriel Shipper, MD  phenytoin (DILANTIN) 100 MG ER capsule Take 2 capsules (200 mg total) by  mouth at bedtime. 06/15/12  Yes Gabriel Shipper, MD  simvastatin (ZOCOR) 10 MG tablet Take 1 tablet (10 mg total) by mouth daily at 6 PM. 06/15/12  Yes Gabriel Shipper, MD   Physical Exam: Blood pressure 125/88, pulse 88, temperature 98.7 F (37.1 C), temperature source Oral, resp. rate 22, height 5\' 9"  (1.753 m), weight 80.74 kg (178 lb). Filed Vitals:   07/05/12 0750 07/05/12 0751 07/06/12 0530 07/06/12 0531  BP: 115/74 114/78 119/81 125/88  Pulse: 88 98 84 88  Temp: 98.3 F (36.8 C)  98.7 F (37.1 C)   TempSrc: Oral  Oral   Resp: 16  22   Height:      Weight:       Clear rrr A+Ox3, NAD +BS, soft, NT/ND    Labs on Admission:  Basic Metabolic Panel:  Recent Labs Lab 07/01/12 0227 07/02/12 0349  NA 128* 137  K 3.2* 3.1*  CL 89* 104  CO2 27 24  GLUCOSE 634* 253*  BUN 5* 5*  CREATININE 0.65 0.48*  CALCIUM 9.4 8.5   Liver Function Tests:  Recent Labs Lab 07/01/12 0227  AST 38*  ALT 36  ALKPHOS 106  BILITOT 0.5  PROT 7.4  ALBUMIN 3.8    Recent Labs Lab 07/01/12 0227  LIPASE 16   No results found for this basename: AMMONIA,  in the last 168 hours CBC:  Recent Labs Lab 07/01/12 0227  WBC 3.7*  NEUTROABS 2.0  HGB 13.2  HCT 36.5*  MCV 84.9  PLT 174   Cardiac Enzymes: No results found for this basename: CKTOTAL, CKMB, CKMBINDEX, TROPONINI,  in the last 168 hours BNP: No components found with this basename: POCBNP,  CBG:  Recent Labs Lab 07/05/12 1201 07/05/12 1730 07/05/12 2154 07/06/12 0601 07/06/12 1152  GLUCAP 374* 371* 521* 254* 355*    Radiological Exams on Admission: No results found.    Time spent: 70 min  Gabriel Rush [facility] Pager [phone]  If 7PM-7AM, please contact night-coverage [email or web address] Password [facility] 07/06/2012, 12:34 PM

## 2012-07-06 NOTE — Progress Notes (Signed)
Patient ID: Gabriel Rush, male   DOB: [birth year], 52 y.o.   MRN: [id number]  D: Patient pleasant on approach tonight. Reports blood sugars have been better today. Reports that he will do better with his snacking and is trying to drink a lot of water. Reports mood improved some tonight. Currently denies any SI or HI. A: Staff will monitor on q 15 minute checks, follow treatment plan, and give meds as ordered. R: Cooperative on unit. No present issues.

## 2012-07-06 NOTE — [facility] Group Notes (Signed)
[facility] LCSW Group Therapy  07/06/2012  1:15 PM  Type of Therapy:  Group Therapy  Participation Level:  Minimal  Participation Quality:  Attentive  Affect:  Not feeling well obviously  Cognitive:  Alert and Oriented  Insight:  Limited  Engagement in Therapy:  Limited  Modes of Intervention:  Discussion, Education and Support  Summary of Progress/Problems: Group session included an educational portion on Post Acute Withdrawal Syndrome (PAWS).  Patients were able to process the information and share feelings related to length of time recovery takes. [name] expressed interest in discussion yet did not share. Still appeared to not feel well.   Clide Dales

## 2012-07-06 NOTE — [facility] Group Notes (Signed)
[facility] LCSW Aftercare Discharge Planning Group Note   07/06/2012   Participation Quality:  Minimal  Mood/Affect:  Patient reports he was not feeling well and appeared so; encouraged patient to touch base with nurse which he did   Harrill, [name]

## 2012-07-06 NOTE — Progress Notes (Signed)
Adult Psychoeducational Group Note  Date:  07/06/2012 Time:  10:00AM  Group Topic/Focus:  Relapse Prevention Planning:   The focus of this group is to define relapse and discuss the need for planning to combat relapse.  Participation Level:  Active  Participation Quality:  Appropriate and Attentive  Affect:  Appropriate  Cognitive:  Appropriate  Insight: Appropriate  Engagement in Group:  Engaged  Modes of Intervention:  Activity  Additional Comments:    Lorin Mercy 07/06/2012, 12:04 PM

## 2012-07-06 NOTE — Progress Notes (Signed)
D: Patient pleasant and cooperative with staff and peers. Patient's affect/mood is anxious. He reported on the self inventory sheet that his sleep is fair, appetite/ability to pay attention is improving and energy level is low. Patient rated depression and feelings of hopelessness "6". He's attending/participating in groups and talking with peers in the dayroom. Patient compliant with medication regimen.  A: Support and encouragement provided to patient. Administered scheduled medications per ordering MD. Monitor Q15 minute checks for safety.  R: Patient receptive. Denies SI/HI/AVH. Patient remains safe on the unit.

## 2012-07-06 NOTE — Progress Notes (Signed)
[facility] MD Progress Note  07/06/2012 5:08 PM Gabriel Rush  MRN:  [id number] Subjective:  Blood sugar still not adequately controlled, still with diarrhea, malaise, anxiety, worry, cravings for alcohol. States if he was out of here he would be drinking. Admits that he gives in to the cravings Diagnosis:  Alcohol Dependence, withdrawal, MDD   ADL's:  Intact  Sleep: Poor  Appetite:  Poor  Suicidal Ideation:  Plan:  denies Intent:  denies Means:  denies Homicidal Ideation:  Plan:  denies Intent:  denies Means:  denies AEB (as evidenced by):  Psychiatric Specialty Exam: Review of Systems  Constitutional: Positive for malaise/fatigue.  HENT: Negative.   Respiratory: Negative.   Cardiovascular: Negative.   Gastrointestinal: Positive for abdominal pain and diarrhea.  Genitourinary: Negative.   Musculoskeletal:       Leg pain  Skin: Negative.   Neurological: Positive for dizziness and weakness.  Endo/Heme/Allergies: Negative.   Psychiatric/Behavioral: Positive for depression and substance abuse. The patient is nervous/anxious and has insomnia.     Blood pressure 125/88, pulse 88, temperature 98.7 F (37.1 C), temperature source Oral, resp. rate 22, height 5\' 9"  (1.753 m), weight 80.74 kg (178 lb).Body mass index is 26.27 kg/(m^2).  General Appearance: Disheveled  Eye Solicitor::  Fair  Speech:  Clear and Coherent and Slow  Volume:  Decreased  Mood:  Depressed  Affect:  Restricted  Thought Process:  Coherent and Goal Directed  Orientation:  Full (Time, Place, and Person)  Thought Content:  Rumination and worries, concerns, somatically focused  Suicidal Thoughts:  Yes.  without intent/plan, intermittent  Homicidal Thoughts:  No  Memory:  Immediate;   Fair Recent;   Fair Remote;   Fair  Judgement:  Fair  Insight:  Present and Shallow  Psychomotor Activity:  Restlessness  Concentration:  Fair  Recall:  Fair  Akathisia:  No  Handed:  Right  AIMS (if indicated):     Assets:   Desire for Improvement  Sleep:  Number of Hours: 4.5   Current Medications: Current Facility-Administered Medications  Medication Dose Route Frequency Provider Last Rate Last Dose  . acetaminophen (TYLENOL) tablet 650 mg  650 mg Oral Q6H PRN Kerry Hough, PA-C      . alum & mag hydroxide-simeth (MAALOX/MYLANTA) 200-200-20 MG/5ML suspension 30 mL  30 mL Oral Q4H PRN Kerry Hough, PA-C      . chlordiazePOXIDE (LIBRIUM) capsule 25 mg  25 mg Oral TID Kerry Hough, PA-C   25 mg at 07/06/12 1158  . folic acid (FOLVITE) tablet 1 mg  1 mg Oral Daily Kerry Hough, PA-C   1 mg at 07/06/12 4098  . gabapentin (NEURONTIN) capsule 300 mg  300 mg Oral TID Kerry Hough, PA-C   300 mg at 07/06/12 1158  . insulin aspart (novoLOG) injection 0-15 Units  0-15 Units Subcutaneous TID WC Kerry Hough, PA-C   15 Units at 07/06/12 1201  . insulin aspart (novoLOG) injection 0-5 Units  0-5 Units Subcutaneous QHS Kerry Hough, PA-C   5 Units at 07/04/12 2237  . insulin aspart (novoLOG) injection 6 Units  6 Units Subcutaneous TID WC Jessica U Vann, DO      . insulin glargine (LANTUS) injection 40 Units  40 Units Subcutaneous QHS Joseph Art, DO      . lipase/protease/amylase (CREON-12/PANCREASE) capsule 3 capsule  3 capsule Oral TID AC Rachael Fee, MD   3 capsule at 07/06/12 1159  . lisinopril (PRINIVIL,ZESTRIL) tablet 10  mg  10 mg Oral Daily Sanjuana Kava, NP   10 mg at 07/06/12 0830  . loperamide (IMODIUM) capsule 2 mg  2 mg Oral PRN Sanjuana Kava, NP   2 mg at 07/05/12 1004  . magnesium hydroxide (MILK OF MAGNESIA) suspension 30 mL  30 mL Oral Daily PRN Kerry Hough, PA-C      . mirtazapine (REMERON) tablet 15 mg  15 mg Oral QHS Kerry Hough, PA-C   15 mg at 07/05/12 2219  . nabumetone (RELAFEN) tablet 500 mg  500 mg Oral BID Rachael Fee, MD   500 mg at 07/06/12 4540  . ondansetron (ZOFRAN-ODT) disintegrating tablet 4 mg  4 mg Oral Q8H PRN Kerry Hough, PA-C      . phenytoin  (DILANTIN) ER capsule 200 mg  200 mg Oral QHS Kerry Hough, PA-C   200 mg at 07/05/12 2219  . potassium chloride SA (K-DUR,KLOR-CON) CR tablet 20 mEq  20 mEq Oral BID Sanjuana Kava, NP   20 mEq at 07/06/12 0830  . simvastatin (ZOCOR) tablet 10 mg  10 mg Oral q1800 Kerry Hough, PA-C   10 mg at 07/05/12 1734  . thiamine (VITAMIN B-1) tablet 100 mg  100 mg Oral Daily Kerry Hough, PA-C   100 mg at 07/06/12 9811  . traZODone (DESYREL) tablet 100 mg  100 mg Oral QHS,MR X 1 Sanjuana Kava, NP   100 mg at 07/05/12 2219    Lab Results:  Results for orders placed during the hospital encounter of 07/02/12 (from the past 48 hour(s))  GLUCOSE, CAPILLARY     Status: Abnormal   Collection Time    07/04/12  5:11 PM      Result Value Range   Glucose-Capillary 421 (*) 70 - 99 mg/dL  GLUCOSE, CAPILLARY     Status: Abnormal   Collection Time    07/04/12  6:51 PM      Result Value Range   Glucose-Capillary 358 (*) 70 - 99 mg/dL   Comment 1 Notify RN    GLUCOSE, CAPILLARY     Status: Abnormal   Collection Time    07/04/12  9:21 PM      Result Value Range   Glucose-Capillary 371 (*) 70 - 99 mg/dL  GLUCOSE, CAPILLARY     Status: Abnormal   Collection Time    07/05/12  6:05 AM      Result Value Range   Glucose-Capillary 258 (*) 70 - 99 mg/dL  GLUCOSE, CAPILLARY     Status: Abnormal   Collection Time    07/05/12 12:01 PM      Result Value Range   Glucose-Capillary 374 (*) 70 - 99 mg/dL  GLUCOSE, CAPILLARY     Status: Abnormal   Collection Time    07/05/12  5:30 PM      Result Value Range   Glucose-Capillary 371 (*) 70 - 99 mg/dL   Comment 1 Notify RN    GLUCOSE, CAPILLARY     Status: Abnormal   Collection Time    07/05/12  9:54 PM      Result Value Range   Glucose-Capillary 521 (*) 70 - 99 mg/dL   Comment 1 Notify RN    GLUCOSE, CAPILLARY     Status: Abnormal   Collection Time    07/06/12  6:01 AM      Result Value Range   Glucose-Capillary 254 (*) 70 - 99 mg/dL  GLUCOSE,  CAPILLARY  Status: Abnormal   Collection Time    07/06/12 11:52 AM      Result Value Range   Glucose-Capillary 355 (*) 70 - 99 mg/dL   Comment 1 Notify RN      Physical Findings: AIMS: Facial and Oral Movements Muscles of Facial Expression: None, normal Lips and Perioral Area: None, normal Jaw: None, normal Tongue: None, normal,Extremity Movements Upper (arms, wrists, hands, fingers): None, normal Lower (legs, knees, ankles, toes): None, normal, Trunk Movements Neck, shoulders, hips: None, normal, Overall Severity Severity of abnormal movements (highest score from questions above): None, normal Incapacitation due to abnormal movements: None, normal Patient's awareness of abnormal movements (rate only patient's report): No Awareness, Dental Status Current problems with teeth and/or dentures?: No Does patient usually wear dentures?: No  CIWA:  CIWA-Ar Total: 0 COWS:     Treatment Plan Summary: Daily contact with patient to assess and evaluate symptoms and progress in treatment Medication management  Plan: Supportive approach/coping skills/relapse prevention           Optimize control of his blood sugar/consult internal medicine          Pursue and complete the detox           Reassess and address the co morbidites  Medical Decision Making Problem Points:  Review of psycho-social stressors (1) Data Points:  Review of medication regiment & side effects (2) Review of new medications or change in dosage (2)  I certify that inpatient services furnished can reasonably be expected to improve the patient's condition.   Allisha Harter A 07/06/2012, 5:08 PM

## 2012-07-06 NOTE — Progress Notes (Signed)
Adult Psychoeducational Group Note  Date:  07/06/2012 Time:  8:22 PM  Group Topic/Focus:  Goals Group:   The focus of this group is to help patients establish daily goals to achieve during treatment and discuss how the patient can incorporate goal setting into their daily lives to aide in recovery.  Participation Level:  Active  Participation Quality:  Appropriate  Affect:  Appropriate  Cognitive:  Appropriate  Insight: Appropriate  Engagement in Group:  Engaged  Modes of Intervention:  Discussion  Additional Comments:  Attended AA  Aldona Lento 07/06/2012, 8:22 PM

## 2012-07-07 LAB — BASIC METABOLIC PANEL
BUN: 9 mg/dL (ref 6–23)
CO2: 31 mEq/L (ref 19–32)
Calcium: 9.7 mg/dL (ref 8.4–10.5)
Creatinine, Ser: 0.72 mg/dL (ref 0.50–1.35)
GFR calc non Af Amer: >90 mL/min (ref >90)
Glucose, Bld: 299 mg/dL — ABNORMAL HIGH (ref 70–99)

## 2012-07-07 LAB — GLUCOSE, CAPILLARY: Glucose-Capillary: 304 mg/dL — ABNORMAL HIGH (ref 70–99)

## 2012-07-07 MED ORDER — INSULIN GLARGINE 100 UNIT/ML ~~LOC~~ SOLN
60.0000 [IU] | Freq: Every day | SUBCUTANEOUS | Status: DC
  Administered 2012-07-07: 60 [IU] via SUBCUTANEOUS

## 2012-07-07 NOTE — Progress Notes (Signed)
Patient ID: Gabriel Rush, male   DOB: 21-Apr-1960, 52 y.o.   MRN: [id number] [facility] MD Progress Note  07/07/2012 3:27 PM Jaidev Sanger  MRN:  [id number]  Subjective:  "I'm having some skeletal/muscle pains to my hands. I believe it is coming from my neuropathy. My mood is improving. Being in this hospital has helped me a lot. I'm still having some withdrawal symptoms; some shakes, sweating dizziness. But overall, I have seen a lot of improvement. I wish that I could get as much help for my diabetic condition. I have no insurance, no primary care provider and or medicaid".   Diagnosis:  Alcohol Dependence, withdrawal, MDD   ADL's:  Intact  Sleep: Poor  Appetite:  Poor  Suicidal Ideation:  Plan:  denies Intent:  denies Means:  denies Homicidal Ideation:  Plan:  denies Intent:  denies Means:  denies  AEB (as evidenced by): per patient's reports  Psychiatric Specialty Exam: Review of Systems  Constitutional: Positive for malaise/fatigue.  HENT: Negative.   Respiratory: Negative.   Cardiovascular: Negative.   Gastrointestinal: Positive for abdominal pain and diarrhea.  Genitourinary: Negative.   Musculoskeletal:       Leg pain  Skin: Negative.   Neurological: Positive for dizziness and weakness.  Endo/Heme/Allergies: Negative.   Psychiatric/Behavioral: Positive for depression and substance abuse. The patient is nervous/anxious and has insomnia.     Blood pressure 127/90, pulse 98, temperature 97.4 F (36.3 C), temperature source Oral, resp. rate 18, height 5\' 9"  (1.753 m), weight 80.74 kg (178 lb).Body mass index is 26.27 kg/(m^2).  General Appearance: Disheveled  Eye Solicitor::  Fair  Speech:  Clear and Coherent and Slow  Volume:  Decreased  Mood:  Depressed  Affect:  Restricted  Thought Process:  Coherent and Goal Directed  Orientation:  Full (Time, Place, and Person)  Thought Content:  Rumination and worries, concerns, somatically focused  Suicidal Thoughts:  Yes.   without intent/plan, intermittent  Homicidal Thoughts:  No  Memory:  Immediate;   Fair Recent;   Fair Remote;   Fair  Judgement:  Fair  Insight:  Present and Shallow  Psychomotor Activity:  Restlessness  Concentration:  Fair  Recall:  Fair  Akathisia:  No  Handed:  Right  AIMS (if indicated):     Assets:  Desire for Improvement  Sleep:  Number of Hours: 6.25   Current Medications: Current Facility-Administered Medications  Medication Dose Route Frequency Provider Last Rate Last Dose  . acetaminophen (TYLENOL) tablet 650 mg  650 mg Oral Q6H PRN Kerry Hough, PA-C   650 mg at 07/07/12 1413  . alum & mag hydroxide-simeth (MAALOX/MYLANTA) 200-200-20 MG/5ML suspension 30 mL  30 mL Oral Q4H PRN Kerry Hough, PA-C      . chlordiazePOXIDE (LIBRIUM) capsule 25 mg  25 mg Oral TID Kerry Hough, PA-C   25 mg at 07/07/12 1204  . folic acid (FOLVITE) tablet 1 mg  1 mg Oral Daily Kerry Hough, PA-C   1 mg at 07/07/12 7829  . gabapentin (NEURONTIN) capsule 300 mg  300 mg Oral TID Kerry Hough, PA-C   300 mg at 07/07/12 1204  . insulin aspart (novoLOG) injection 0-15 Units  0-15 Units Subcutaneous TID WC Kerry Hough, PA-C   11 Units at 07/07/12 1201  . insulin aspart (novoLOG) injection 0-5 Units  0-5 Units Subcutaneous QHS Kerry Hough, PA-C   3 Units at 07/06/12 2205  . insulin aspart (novoLOG) injection 6 Units  6  Units Subcutaneous TID WC Joseph Art, DO   6 Units at 07/07/12 1201  . insulin glargine (LANTUS) injection 60 Units  60 Units Subcutaneous QHS Marinda Elk, MD      . lipase/protease/amylase (CREON-12/PANCREASE) capsule 3 capsule  3 capsule Oral TID AC Rachael Fee, MD   3 capsule at 07/07/12 1204  . lisinopril (PRINIVIL,ZESTRIL) tablet 10 mg  10 mg Oral Daily Sanjuana Kava, NP   10 mg at 07/07/12 1610  . loperamide (IMODIUM) capsule 2 mg  2 mg Oral PRN Sanjuana Kava, NP   2 mg at 07/07/12 1413  . magnesium hydroxide (MILK OF MAGNESIA) suspension 30 mL  30  mL Oral Daily PRN Kerry Hough, PA-C      . mirtazapine (REMERON) tablet 15 mg  15 mg Oral QHS Kerry Hough, PA-C   15 mg at 07/06/12 2238  . nabumetone (RELAFEN) tablet 500 mg  500 mg Oral BID Rachael Fee, MD   500 mg at 07/07/12 9604  . ondansetron (ZOFRAN-ODT) disintegrating tablet 4 mg  4 mg Oral Q8H PRN Kerry Hough, PA-C      . phenytoin (DILANTIN) ER capsule 200 mg  200 mg Oral QHS Kerry Hough, PA-C   200 mg at 07/06/12 2238  . potassium chloride SA (K-DUR,KLOR-CON) CR tablet 20 mEq  20 mEq Oral BID Sanjuana Kava, NP   20 mEq at 07/07/12 5409  . simvastatin (ZOCOR) tablet 10 mg  10 mg Oral q1800 Kerry Hough, PA-C   10 mg at 07/06/12 1721  . thiamine (VITAMIN B-1) tablet 100 mg  100 mg Oral Daily Kerry Hough, PA-C   100 mg at 07/07/12 8119  . traZODone (DESYREL) tablet 100 mg  100 mg Oral QHS,MR X 1 Sanjuana Kava, NP   100 mg at 07/06/12 2238    Lab Results:  Results for orders placed during the hospital encounter of 07/02/12 (from the past 48 hour(s))  GLUCOSE, CAPILLARY     Status: Abnormal   Collection Time    07/05/12  5:30 PM      Result Value Range   Glucose-Capillary 371 (*) 70 - 99 mg/dL   Comment 1 Notify RN    GLUCOSE, CAPILLARY     Status: Abnormal   Collection Time    07/05/12  9:54 PM      Result Value Range   Glucose-Capillary 521 (*) 70 - 99 mg/dL   Comment 1 Notify RN    GLUCOSE, CAPILLARY     Status: Abnormal   Collection Time    07/06/12  6:01 AM      Result Value Range   Glucose-Capillary 254 (*) 70 - 99 mg/dL  GLUCOSE, CAPILLARY     Status: Abnormal   Collection Time    07/06/12 11:52 AM      Result Value Range   Glucose-Capillary 355 (*) 70 - 99 mg/dL   Comment 1 Notify RN    GLUCOSE, CAPILLARY     Status: Abnormal   Collection Time    07/06/12  5:15 PM      Result Value Range   Glucose-Capillary 308 (*) 70 - 99 mg/dL  GLUCOSE, CAPILLARY     Status: Abnormal   Collection Time    07/06/12  9:10 PM      Result Value Range    Glucose-Capillary 289 (*) 70 - 99 mg/dL  GLUCOSE, CAPILLARY     Status: Abnormal   Collection  Time    07/07/12  6:01 AM      Result Value Range   Glucose-Capillary 276 (*) 70 - 99 mg/dL   Comment 1 Notify RN    BASIC METABOLIC PANEL     Status: Abnormal   Collection Time    07/07/12  6:38 AM      Result Value Range   Sodium 137  135 - 145 mEq/L   Potassium 4.5  3.5 - 5.1 mEq/L   Chloride 100  96 - 112 mEq/L   CO2 31  19 - 32 mEq/L   Glucose, Bld 299 (*) 70 - 99 mg/dL   BUN 9  6 - 23 mg/dL   Creatinine, Ser 4.09  0.50 - 1.35 mg/dL   Calcium 9.7  8.4 - 81.1 mg/dL   GFR calc non Af Amer >90  >90 mL/min   GFR calc Af Amer >90  >90 mL/min   Comment:            The eGFR has been calculated     using the CKD EPI equation.     This calculation has not been     validated in all clinical     situations.     eGFR's persistently     <90 mL/min signify     possible Chronic Kidney Disease.  GLUCOSE, CAPILLARY     Status: Abnormal   Collection Time    07/07/12 11:36 AM      Result Value Range   Glucose-Capillary 304 (*) 70 - 99 mg/dL    Physical Findings: AIMS: Facial and Oral Movements Muscles of Facial Expression: None, normal Lips and Perioral Area: None, normal Jaw: None, normal Tongue: None, normal,Extremity Movements Upper (arms, wrists, hands, fingers): None, normal Lower (legs, knees, ankles, toes): None, normal, Trunk Movements Neck, shoulders, hips: None, normal, Overall Severity Severity of abnormal movements (highest score from questions above): None, normal Incapacitation due to abnormal movements: None, normal Patient's awareness of abnormal movements (rate only patient's report): No Awareness, Dental Status Current problems with teeth and/or dentures?: No Does patient usually wear dentures?: No  CIWA:  CIWA-Ar Total: 0 COWS:     Treatment Plan Summary: Daily contact with patient to assess and evaluate symptoms and progress in treatment Medication  management  Plan: Supportive approach/coping skills/relapse prevention Optimize control of his blood sugar/consulted internal medicine: see consult notes  Pursue and complete the detox  Reassess and address the co morbidities, will refer [facility] care upon discharge.  Medical Decision Making Problem Points:  Review of psycho-social stressors (1) Data Points:  Review of medication regiment & side effects (2) Review of new medications or change in dosage (2)  I certify that inpatient services furnished can reasonably be expected to improve the patient's condition.   Sanjuana Kava, PMHNP 07/07/2012, 3:27 PM I agreed with the finding and involve in treatment planning.

## 2012-07-07 NOTE — Progress Notes (Signed)
Psychoeducational Group Note  Date:  07/07/2012 Time:  0945 am  Group Topic/Focus:  Identifying Needs:   The focus of this group is to help patients identify their personal needs that have been historically problematic and identify healthy behaviors to address their needs.  Participation Level:  Active  Participation Quality:  Appropriate, Attentive, Sharing and Supportive  Affect:  Appropriate  Cognitive:  Alert and Appropriate  Insight:  Developing/Improving  Engagement in Group:  Developing/Improving  Additional Comments:    Andrena Mews 07/07/2012,3:59 PM

## 2012-07-07 NOTE — Progress Notes (Signed)
D   Pt has been appropriate and pleasant  He continues to complain of diarrhea   He continues to be non compliant with his diet and his cbg are running high  He has attended and participated in groups  A   Verbal support given  Continue to encourage diet complaince  Medications administered and effectiveness monitored  Q 15 min checks R pt safe at present

## 2012-07-07 NOTE — [facility] Group Notes (Signed)
[facility] Group Notes:  (Clinical Social Work)  07/07/2012     10-11AM  Summary of Progress/Problems:   The main focus of today's process group was for the patient to identify ways in which they have in the past sabotaged their own recovery. Motivational Interviewing was utilized to ask the group members what they get out of their substance use, and what reasons they may have for wanting to change.  The Stages of Change were explained using a handout, and patients identified where they currently are with regard to stages of change.  The patient expressed that he goes back to the old places where there are people he knows are using, and the reason he does this is to test his own strength.  So far he has always failed that self-test.  He wants to change so that he does not die yet.  He is in  Preparation stage, is willing to follow directions for self-help.  Type of Therapy:  Group Therapy - Process   Participation Level:  Active  Participation Quality:  Appropriate, Attentive, Sharing and Supportive  Affect:  Appropriate  Cognitive:  Alert, Appropriate and Oriented  Insight:  Engaged  Engagement in Therapy:  Engaged  Modes of Intervention:  Education, Support and Processing, Motivational Interviewing  [name], LCSW 07/07/2012, 12:56 PM

## 2012-07-07 NOTE — Progress Notes (Signed)
Psychoeducational Group Note  Date:  07/07/2012 Time:  0945 am  Group Topic/Focus:  Identifying Needs:   The focus of this group is to help patients identify their personal needs that have been historically problematic and identify healthy behaviors to address their needs.  Participation Level:  Active  Participation Quality:  Appropriate  Affect:  Appropriate  Cognitive:  Alert  Insight:  Developing/Improving  Engagement in Group:  Developing/Improving  Additional Comments:    Andrena Mews 07/07/2012,10:10 AM

## 2012-07-08 LAB — GLUCOSE, CAPILLARY
Glucose-Capillary: 361 mg/dL — ABNORMAL HIGH (ref 70–99)
Glucose-Capillary: 239 mg/dL — ABNORMAL HIGH (ref 70–99)
Glucose-Capillary: 212 mg/dL — ABNORMAL HIGH (ref 70–99)

## 2012-07-08 MED ORDER — INSULIN GLARGINE 100 UNIT/ML ~~LOC~~ SOLN: 85.0000 [IU] | Freq: Every day | SUBCUTANEOUS | Status: DC

## 2012-07-08 MED ORDER — INSULIN GLARGINE 100 UNIT/ML ~~LOC~~ SOLN
50.0000 [IU] | Freq: Two times a day (BID) | SUBCUTANEOUS | Status: DC
  Administered 2012-07-08 – 2012-07-09 (×2): 50 [IU] via SUBCUTANEOUS

## 2012-07-08 MED ORDER — TRAZODONE HCL 50 MG PO TABS
150.0000 mg | ORAL_TABLET | Freq: Every evening | ORAL | Status: DC | PRN
  Administered 2012-07-08: 150 mg via ORAL
  Filled 2012-07-08 (×5): qty 3
  Filled 2012-07-08: qty 1
  Filled 2012-07-08: qty 3

## 2012-07-08 NOTE — Progress Notes (Signed)
Psychoeducational Group Note  Date:  07/08/2012 Time:  0945 am  Group Topic/Focus:  Making Healthy Choices:   The focus of this group is to help patients identify negative/unhealthy choices they were using prior to admission and identify positive/healthier coping strategies to replace them upon discharge.  Participation Level:  Active  Participation Quality:  Appropriate, Attentive, Sharing and Supportive  Affect:  Appropriate  Cognitive:  Alert and Appropriate  Insight:  Engaged  Engagement in Group:  Engaged  Additional Comments:    Andrena Mews 07/08/2012, 10:29 AM

## 2012-07-08 NOTE — Progress Notes (Signed)
[facility] Group Notes:  (Nursing/MHT/Case Management/Adjunct)  Date:  07/07/2012  Time:  2100  Type of Therapy:  wrap up group  Participation Level:  Active  Participation Quality:  Appropriate, Attentive, Sharing and Supportive  Affect:  Appropriate  Cognitive:  Alert and Appropriate  Insight:  Improving and Lacking  Engagement in Group:  Engaged  Modes of Intervention:  Clarification, Education and Support  Summary of Progress/Problems: Pt reported " I met a lady that gave me good information".  Pt had a diabetic consult in which he reports being informative.  Pt is starting to become more aware of healthy food and beverage choices for his diabetes.    Shelah Lewandowsky 07/08/2012, 3:32 AM

## 2012-07-08 NOTE — Progress Notes (Signed)
Spoke with pt 1:1 who is up on the unit. He remains anxious but reports progress in his treatment. CBG is 304 tonight requiring 4 units of novolog which was given without difficulty. Provided pt with diabetic snack and again reviewed healthy eating options. Supported, encouraged. Medicated per orders. Denies SI/HI/AVH and remains safe. Lawrence Marseilles

## 2012-07-08 NOTE — Progress Notes (Signed)
Patient did attend the evening speaker AA meeting.  

## 2012-07-08 NOTE — Progress Notes (Signed)
Pt up and visible on unit. Status relatively unchanged. He is compliant with groups and meds. Verbalizing understanding of need for diabetic diet though also asking "for just one sweet" for evening snack. CBG is 212 tonight requiring 2 units of coverage. Lantus was changed to 50units BID which will start tomorrow. Pt supported, encouraged. Denies SI/HI/AVH and remains safe. Gabriel Rush

## 2012-07-08 NOTE — Progress Notes (Signed)
Adult Psychoeducational Group Note  Date:  07/08/2012 Time:  7:02 PM  Group Topic/Focus:  Goals Group:   The focus of this group is to help patients establish daily goals to achieve during treatment and discuss how the patient can incorporate goal setting into their daily lives to aide in recovery.  Participation Level:  Active  Participation Quality:  Appropriate, Attentive, Sharing and Supportive  Affect:  Appropriate  Cognitive:  Appropriate  Insight: Appropriate and Good  Engagement in Group:  Engaged  Modes of Intervention:  Discussion and Education  Additional Comments:  Group read "Ending the cycle" from Sunday programming workbook out loud and then discussed. Group was then asked to form a goal, thinking about the "Ending the cycle" reading and what it meant to them. Pt appeared very attentive during group; appeared to be listening to all pts intently.   Dalia Heading 07/08/2012, 7:02 PM

## 2012-07-08 NOTE — Progress Notes (Signed)
Psychoeducational Group Note  Date:  07/08/2012 Time:  0945 am  Group Topic/Focus:  Making Healthy Choices:   The focus of this group is to help patients identify negative/unhealthy choices they were using prior to admission and identify positive/healthier coping strategies to replace them upon discharge.  Participation Level:  Active  Participation Quality:  Appropriate, Attentive, Sharing and Supportive  Affect:  Appropriate  Cognitive:  Alert and Appropriate  Insight:  Engaged  Engagement in Group:  Engaged  Additional Comments:    Jiro Kiester J 07/08/2012, 10:29 AM 

## 2012-07-08 NOTE — Progress Notes (Signed)
D   Pt has been appropriate and pleasant  He continues to complain of diarrhea   He continues to be non compliant with his diet and his cbg are running high  He has attended and participated in groups  A   Verbal support given  Continue to encourage diet complaince  Medications administered and effectiveness monitored  Q 15 min checks R pt safe at present 

## 2012-07-08 NOTE — Progress Notes (Signed)
Patient ID: Gabriel Rush, male   DOB: 09/09/1960, 52 y.o.   MRN: [id number] Patient ID: Gabriel Rush, male   DOB: Mar 13, 1960, 52 y.o.   MRN: [id number] [facility] MD Progress Note  07/08/2012 3:23 PM Gabriel Rush  MRN:  [id number]  Subjective:  "I'm having some skeletal/muscle pains, this time to my legs. I have not been sleeping well, I feel kind of disoriented today. My mood is spongy, I'm trying to listen to others during group and absorb all that I can that will help me when I get out of here". Admits SI, without any plans and or intent to hurt self.  Diagnosis:  Alcohol Dependence, withdrawal, MDD   ADL's:  Intact  Sleep: Poor  Appetite:  Poor  Suicidal Ideation: "Yes" Plan:  denies Intent:  denies Means:  denies Homicidal Ideation:  Plan:  denies Intent:  denies Means:  denies  AEB (as evidenced by): per patient's reports  Psychiatric Specialty Exam: Review of Systems  Constitutional: Positive for malaise/fatigue.  HENT: Negative.   Respiratory: Negative.   Cardiovascular: Negative.   Gastrointestinal: Positive for abdominal pain and diarrhea.  Genitourinary: Negative.   Musculoskeletal:       Leg pain  Skin: Negative.   Neurological: Positive for dizziness and weakness.  Endo/Heme/Allergies: Negative.   Psychiatric/Behavioral: Positive for depression and substance abuse. The patient is nervous/anxious and has insomnia.     Blood pressure 128/91, pulse 110, temperature 98.3 F (36.8 C), temperature source Oral, resp. rate 20, height 5\' 9"  (1.753 m), weight 80.74 kg (178 lb).Body mass index is 26.27 kg/(m^2).  General Appearance: Disheveled  Eye Solicitor::  Fair  Speech:  Clear and Coherent and Slow  Volume:  Decreased  Mood:  Depressed  Affect:  Restricted  Thought Process:  Coherent and Goal Directed  Orientation:  Full (Time, Place, and Person)  Thought Content:  Rumination and worries, concerns, somatically focused  Suicidal Thoughts:  Yes.  without  intent/plan, intermittent  Homicidal Thoughts:  No  Memory:  Immediate;   Fair Recent;   Fair Remote;   Fair  Judgement:  Fair  Insight:  Present and Shallow  Psychomotor Activity:  Restlessness  Concentration:  Fair  Recall:  Fair  Akathisia:  No  Handed:  Right  AIMS (if indicated):     Assets:  Desire for Improvement  Sleep:  Number of Hours: 3.25   Current Medications: Current Facility-Administered Medications  Medication Dose Route Frequency Provider Last Rate Last Dose  . acetaminophen (TYLENOL) tablet 650 mg  650 mg Oral Q6H PRN Kerry Hough, PA-C   650 mg at 07/07/12 1413  . alum & mag hydroxide-simeth (MAALOX/MYLANTA) 200-200-20 MG/5ML suspension 30 mL  30 mL Oral Q4H PRN Kerry Hough, PA-C      . chlordiazePOXIDE (LIBRIUM) capsule 25 mg  25 mg Oral TID Kerry Hough, PA-C   25 mg at 07/08/12 1216  . folic acid (FOLVITE) tablet 1 mg  1 mg Oral Daily Kerry Hough, PA-C   1 mg at 07/08/12 0809  . gabapentin (NEURONTIN) capsule 300 mg  300 mg Oral TID Kerry Hough, PA-C   300 mg at 07/08/12 1216  . insulin aspart (novoLOG) injection 0-15 Units  0-15 Units Subcutaneous TID WC Kerry Hough, PA-C   15 Units at 07/08/12 1219  . insulin aspart (novoLOG) injection 0-5 Units  0-5 Units Subcutaneous QHS Kerry Hough, PA-C   4 Units at 07/07/12 2256  . insulin aspart (novoLOG) injection 6  Units  6 Units Subcutaneous TID WC Joseph Art, DO   6 Units at 07/08/12 1218  . insulin glargine (LANTUS) injection 50 Units  50 Units Subcutaneous BID Marinda Elk, MD      . lipase/protease/amylase (CREON-12/PANCREASE) capsule 3 capsule  3 capsule Oral TID AC Rachael Fee, MD   3 capsule at 07/08/12 1216  . lisinopril (PRINIVIL,ZESTRIL) tablet 10 mg  10 mg Oral Daily Sanjuana Kava, NP   10 mg at 07/08/12 0809  . loperamide (IMODIUM) capsule 2 mg  2 mg Oral PRN Sanjuana Kava, NP   2 mg at 07/08/12 1046  . magnesium hydroxide (MILK OF MAGNESIA) suspension 30 mL  30 mL Oral  Daily PRN Kerry Hough, PA-C      . mirtazapine (REMERON) tablet 15 mg  15 mg Oral QHS Kerry Hough, PA-C   15 mg at 07/07/12 2252  . nabumetone (RELAFEN) tablet 500 mg  500 mg Oral BID Rachael Fee, MD   500 mg at 07/08/12 0809  . ondansetron (ZOFRAN-ODT) disintegrating tablet 4 mg  4 mg Oral Q8H PRN Kerry Hough, PA-C      . phenytoin (DILANTIN) ER capsule 200 mg  200 mg Oral QHS Kerry Hough, PA-C   200 mg at 07/07/12 2252  . simvastatin (ZOCOR) tablet 10 mg  10 mg Oral q1800 Kerry Hough, PA-C   10 mg at 07/07/12 1728  . thiamine (VITAMIN B-1) tablet 100 mg  100 mg Oral Daily Kerry Hough, PA-C   100 mg at 07/08/12 0810  . traZODone (DESYREL) tablet 100 mg  100 mg Oral QHS,MR X 1 Sanjuana Kava, NP   100 mg at 07/07/12 2252    Lab Results:  Results for orders placed during the hospital encounter of 07/02/12 (from the past 48 hour(s))  GLUCOSE, CAPILLARY     Status: Abnormal   Collection Time    07/06/12  5:15 PM      Result Value Range   Glucose-Capillary 308 (*) 70 - 99 mg/dL  GLUCOSE, CAPILLARY     Status: Abnormal   Collection Time    07/06/12  9:10 PM      Result Value Range   Glucose-Capillary 289 (*) 70 - 99 mg/dL  GLUCOSE, CAPILLARY     Status: Abnormal   Collection Time    07/07/12  6:01 AM      Result Value Range   Glucose-Capillary 276 (*) 70 - 99 mg/dL   Comment 1 Notify RN    BASIC METABOLIC PANEL     Status: Abnormal   Collection Time    07/07/12  6:38 AM      Result Value Range   Sodium 137  135 - 145 mEq/L   Potassium 4.5  3.5 - 5.1 mEq/L   Chloride 100  96 - 112 mEq/L   CO2 31  19 - 32 mEq/L   Glucose, Bld 299 (*) 70 - 99 mg/dL   BUN 9  6 - 23 mg/dL   Creatinine, Ser 5.78  0.50 - 1.35 mg/dL   Calcium 9.7  8.4 - 46.9 mg/dL   GFR calc non Af Amer >90  >90 mL/min   GFR calc Af Amer >90  >90 mL/min   Comment:            The eGFR has been calculated     using the CKD EPI equation.     This calculation has not been  validated in all  clinical     situations.     eGFR's persistently     <90 mL/min signify     possible Chronic Kidney Disease.  GLUCOSE, CAPILLARY     Status: Abnormal   Collection Time    07/07/12 11:36 AM      Result Value Range   Glucose-Capillary 304 (*) 70 - 99 mg/dL  GLUCOSE, CAPILLARY     Status: Abnormal   Collection Time    07/07/12  5:22 PM      Result Value Range   Glucose-Capillary 359 (*) 70 - 99 mg/dL  GLUCOSE, CAPILLARY     Status: Abnormal   Collection Time    07/07/12  9:00 PM      Result Value Range   Glucose-Capillary 306 (*) 70 - 99 mg/dL   Comment 1 Notify RN     Comment 2 Documented in Chart    GLUCOSE, CAPILLARY     Status: Abnormal   Collection Time    07/08/12  6:09 AM      Result Value Range   Glucose-Capillary 243 (*) 70 - 99 mg/dL  GLUCOSE, CAPILLARY     Status: Abnormal   Collection Time    07/08/12 11:14 AM      Result Value Range   Glucose-Capillary 361 (*) 70 - 99 mg/dL    Physical Findings: AIMS: Facial and Oral Movements Muscles of Facial Expression: None, normal Lips and Perioral Area: None, normal Jaw: None, normal Tongue: None, normal,Extremity Movements Upper (arms, wrists, hands, fingers): None, normal Lower (legs, knees, ankles, toes): None, normal, Trunk Movements Neck, shoulders, hips: None, normal, Overall Severity Severity of abnormal movements (highest score from questions above): None, normal Incapacitation due to abnormal movements: None, normal Patient's awareness of abnormal movements (rate only patient's report): No Awareness, Dental Status Current problems with teeth and/or dentures?: No Does patient usually wear dentures?: No  CIWA:  CIWA-Ar Total: 0 COWS:     Treatment Plan Summary: Daily contact with patient to assess and evaluate symptoms and progress in treatment Medication management  Plan: Supportive approach/coping skills/relapse prevention. Increased Trazodone from 100 mg to 150 mg Q bedtime for sleep Pursue and  complete the detox Reassess and address the co morbidities, will refer to [facility] care upon discharge. Continue current plan of care.  Medical Decision Making Problem Points:  Review of psycho-social stressors (1) Data Points:  Review of medication regiment & side effects (2) Review of new medications or change in dosage (2)  I certify that inpatient services furnished can reasonably be expected to improve the patient's condition.   Armandina Stammer I, PMHNP 07/08/2012, 3:23 PM I agreed with the finding and involve in treatment planning.

## 2012-07-08 NOTE — [facility] Group Notes (Signed)
[facility] Group Notes:  (Clinical Social Work)  07/08/2012  10:00-11:00AM  Summary of Progress/Problems:   The main focus of today's process group was to   identify the patient's current support system and decide on other supports that can be put in place.  Four definitions/levels of support were discussed and an exercise was utilized to show how much stronger we become with additional supports.  An emphasis was placed on using counselor, doctor, therapy groups, 12-step groups, and problem-specific support groups to expand supports, as well as doing something different than has been done before. The patient expressed a willingness to add a "broader vision to let people in" and said he will go to AA but not sure if he ready to seek a sponsor.  Type of Therapy:  Process Group with Motivational Interviewing  Participation Level:  Active  Participation Quality:  Appropriate, Attentive and Sharing  Affect:  Blunted  Cognitive:  Appropriate and Oriented  Insight:  Engaged  Engagement in Therapy:  Engaged  Modes of Intervention:   Education, Support and Processing, Activity  [employer], LCSW 07/08/2012, 1:27 PM

## 2012-07-09 DIAGNOSIS — F329 Major depressive disorder, single episode, unspecified: Secondary | ICD-10-CM

## 2012-07-09 DIAGNOSIS — F3289 Other specified depressive episodes: Secondary | ICD-10-CM

## 2012-07-09 LAB — GLUCOSE, CAPILLARY: Glucose-Capillary: 176 mg/dL — ABNORMAL HIGH (ref 70–99)

## 2012-07-09 MED ORDER — MIRTAZAPINE 15 MG PO TABS: 15.0000 mg | ORAL_TABLET | Freq: Every day | ORAL | Status: DC

## 2012-07-09 MED ORDER — GABAPENTIN 300 MG PO CAPS: 300.0000 mg | ORAL_CAPSULE | Freq: Three times a day (TID) | ORAL | Status: DC

## 2012-07-09 MED ORDER — PHENYTOIN SODIUM EXTENDED 100 MG PO CAPS: 200.0000 mg | ORAL_CAPSULE | Freq: Every day | ORAL | Status: DC

## 2012-07-09 MED ORDER — INSULIN GLARGINE 100 UNIT/ML ~~LOC~~ SOLN
30.0000 [IU] | Freq: Every day | SUBCUTANEOUS | Status: DC
  Filled 2012-07-09: qty 10

## 2012-07-09 MED ORDER — LISINOPRIL 10 MG PO TABS: 10.0000 mg | ORAL_TABLET | Freq: Every day | ORAL | Status: DC

## 2012-07-09 MED ORDER — SIMVASTATIN 10 MG PO TABS: 10.0000 mg | ORAL_TABLET | Freq: Every day | ORAL | Status: DC

## 2012-07-09 MED ORDER — PANCRELIPASE (LIP-PROT-AMYL) 12000-38000 UNITS PO CPEP
2.0000 | ORAL_CAPSULE | Freq: Three times a day (TID) | ORAL | Status: DC
  Filled 2012-07-09 (×2): qty 18

## 2012-07-09 MED ORDER — INSULIN GLARGINE 100 UNIT/ML ~~LOC~~ SOLN: 30.0000 [IU] | Freq: Every day | SUBCUTANEOUS | Status: DC

## 2012-07-09 MED ORDER — AMYLASE-LIPASE-PROTEASE 33.2-10-37.5 MU PO CPEP: 2.0000 | ORAL_CAPSULE | Freq: Three times a day (TID) | ORAL | Status: DC

## 2012-07-09 MED ORDER — METFORMIN HCL 500 MG PO TABS
500.0000 mg | ORAL_TABLET | Freq: Two times a day (BID) | ORAL | Status: DC
  Filled 2012-07-09 (×2): qty 28

## 2012-07-09 NOTE — Progress Notes (Signed)
[name] :  Will you be returning to the same living situation after discharge: No. Patient going to [facility] and hopefully to step down from there verses [place] At discharge, do you have transportation home?:Yes,  [facility] will pick up Do you have the ability to pay for your medications:No. Patient has panhandled to support addiction and will consider this to support recovery  Release of information consent forms completed and in the chart;  Patient's signature needed at discharge.  Patient to Follow up at: Follow-up Information   Follow up with [facility] On 07/09/2012. [facility] will pick patient up today)    Contact information:   Gabriel Rush [place] [address] [facility] [phone]      Patient denies SI/HI:   Yes,  denies both     Safety Planning and Suicide Prevention discussed:  No. Patient discharged to another inpatient facility today.   Gabriel Rush 07/09/2012, 6:41 PM

## 2012-07-09 NOTE — Discharge Summary (Signed)
Physician Discharge Summary Note  Patient:  Gabriel Rush is an 52 y.o., male MRN:  [id number] DOB:  [birth year] Patient phone:  [phone] (home)  Patient address:   [place] [address],   Date of Admission:  07/02/2012 Date of Discharge: 07/10/2012   Reason for Admission:  Alcohol dependence  Discharge Diagnoses: Active Problems:   DEPRESSION   Alcohol dependence  Review of Systems  Constitutional: Negative.  Negative for fever, chills, weight loss, malaise/fatigue and diaphoresis.  HENT: Negative for congestion and sore throat.   Eyes: Negative for blurred vision, double vision and photophobia.  Respiratory: Negative for cough, shortness of breath and wheezing.   Cardiovascular: Negative for chest pain, palpitations and PND.  Gastrointestinal: Negative for heartburn, nausea, vomiting, abdominal pain, diarrhea and constipation.  Musculoskeletal: Negative for myalgias, joint pain and falls.  Neurological: Negative for dizziness, tingling, tremors, sensory change, speech change, focal weakness, seizures, loss of consciousness, weakness and headaches.  Endo/Heme/Allergies: Negative for polydipsia. Does not bruise/bleed easily.  Psychiatric/Behavioral: Negative for depression, suicidal ideas, hallucinations, memory loss and substance abuse. The patient has insomnia. The patient is not nervous/anxious.   Depression Symptoms: depressed mood,  feelings of worthlessness/guilt,  suicidal thoughts with specific plan,  anxiety,  insomnia,  loss of energy/fatigue,  disturbed sleep,  (Hypo) Manic Symptoms: Impulsivity,  Anxiety Symptoms: Excessive Worry,  Psychotic Symptoms: Hallucinations: Denies   Level of Care:  OP  Hospital Course:  Leelynd presented to the ED BIB [facility]. 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 He stated that he had a plan to take all the pills he could get and walk into traffic. He  noted that all of his symptoms have gotten worse over the past 2 months, and that he has lost everything since his mother passed. He states he is also a diabetic and has multiple medical problems for which he needs care.     He was given medical clearance and transferred to [facility] for further stabilization and care.  His labs were remarkable in that [facility] had a glucose inexcess of 600mg , his hypokalemia was addressed as well as his hyperglycemia, renal insufficiency, and hyponatremia. He still managed to have severely elevated blood glucoses on the unit depsite dietary consults. [facility] reported  His symptoms were a depressed mood, feelings of worthlessness/guilt, suicidal thoughts with a specific plan, anxiety, insomnia, loss of energy/fatigue and distured sleep. He also noted that he suffered from impulsivity, excessive worry, but denied AVH.      Raska was evaluated and started on his regular medication as well as Remeron for his sleep.  His regular medications were continued. He was seen on a daily basis by clinical providers to assess his response to treatment. Improvement was noted by affect, reports of improved symptoms, including better sleep and improved appetite.  Dungan had requested assistance with his substance abuse and was referred to [facility] for further treatment.      On the day of discharge Cervenka denied SI/HI and reported no AVH. He was picked up by the [facility]ena[facility] Niece from [facility] with plans to continue his recovery there. He was in much improved condition than upon arrival and all felt that he was stable for discharge with the plan to follow up as noted. Consults:  Internal medicine  Significant Diagnostic Studies:  labs: for uncontrolled diabetes due to non-compliance  Discharge Vitals:   Blood pressure 121/85, pulse 113, temperature 97.2 F (36.2 C), temperature source  Oral, resp. rate 20, height 5\' 9"  (1.753 m), weight 80.74 kg (178 lb). Body mass index is 26.27 kg/(m^2). Lab Results:    Results for orders placed during the hospital encounter of 07/02/12 (from the past 72 hour(s))  GLUCOSE, CAPILLARY     Status: Abnormal   Collection Time    07/07/12  5:22 PM      Result Value Range   Glucose-Capillary 359 (*) 70 - 99 mg/dL  GLUCOSE, CAPILLARY     Status: Abnormal   Collection Time    07/07/12  9:00 PM      Result Value Range   Glucose-Capillary 306 (*) 70 - 99 mg/dL   Comment 1 Notify RN     Comment 2 Documented in Chart    GLUCOSE, CAPILLARY     Status: Abnormal   Collection Time    07/08/12  6:09 AM      Result Value Range   Glucose-Capillary 243 (*) 70 - 99 mg/dL  GLUCOSE, CAPILLARY     Status: Abnormal   Collection Time    07/08/12 11:14 AM      Result Value Range   Glucose-Capillary 361 (*) 70 - 99 mg/dL  GLUCOSE, CAPILLARY     Status: Abnormal   Collection Time    07/08/12  5:08 PM      Result Value Range   Glucose-Capillary 239 (*) 70 - 99 mg/dL  GLUCOSE, CAPILLARY     Status: Abnormal   Collection Time    07/08/12  9:06 PM      Result Value Range   Glucose-Capillary 212 (*) 70 - 99 mg/dL   Comment 1 Documented in Chart     Comment 2 Notify RN    GLUCOSE, CAPILLARY     Status: Abnormal   Collection Time    07/09/12  6:22 AM      Result Value Range   Glucose-Capillary 176 (*) 70 - 99 mg/dL  GLUCOSE, CAPILLARY     Status: Abnormal   Collection Time    07/09/12 11:41 AM      Result Value Range   Glucose-Capillary 232 (*) 70 - 99 mg/dL    Physical Findings: AIMS: Facial and Oral Movements Muscles of Facial Expression: None, normal Lips and Perioral Area: None, normal Jaw: None, normal Tongue: None, normal,Extremity Movements Upper (arms, wrists, hands, fingers): None, normal Lower (legs, knees, ankles, toes): None, normal, Trunk Movements Neck, shoulders, hips: None, normal, Overall Severity Severity of abnormal movements (highest score from questions above): None, normal Incapacitation due to abnormal movements: None, normal Patient's  awareness of abnormal movements (rate only patient's report): No Awareness, Dental Status Current problems with teeth and/or dentures?: No Does patient usually wear dentures?: No  CIWA:  CIWA-Ar Total: 0 COWS:     Psychiatric Specialty Exam: See Psychiatric Specialty Exam and Suicide Risk Assessment completed by Attending Physician prior to discharge.  Discharge destination:  Home  Is patient on multiple antipsychotic therapies at discharge:  No   Has Patient had three or more failed trials of antipsychotic monotherapy by history:  No  Recommended Plan for Multiple Antipsychotic Therapies: Not applicable  Discharge Orders   Future Orders Complete By Expires     Diet - low sodium heart healthy  As directed     Discharge instructions  As directed     Comments:      Take all of your medications as directed. Be sure to keep all of your follow up appointments.  If you are unable  to keep your follow up appointment, call your Doctor's office to let them know, and reschedule.  Make sure that you have enough medication to last until your appointment. Be sure to get plenty of rest. Going to bed at the same time each night will help. Try to avoid sleeping during the day.  Increase your activity as tolerated. Regular exercise will help you to sleep better and improve your mental health. Eating a heart healthy diet is recommended. Try to avoid salty or fried foods. Be sure to avoid all alcohol and illegal drugs.    Increase activity slowly  As directed         Medication List    STOP taking these medications       chlordiazePOXIDE 10 MG capsule  Commonly known as:  LIBRIUM     loperamide 2 MG capsule  Commonly known as:  IMODIUM     ondansetron 4 MG disintegrating tablet  Commonly known as:  ZOFRAN ODT     oxyCODONE 5 MG immediate release tablet  Commonly known as:  Oxy IR/ROXICODONE      TAKE these medications     Indication   amylase-lipase-protease 33.03-20-35.5 MU per capsule   Commonly known as:  LIPRAM-CR10  Take 2 capsules by mouth 3 (three) times daily with meals.   Indication:  Pancreatic Insufficiency     gabapentin 300 MG capsule  Commonly known as:  NEURONTIN  Take 1 capsule (300 mg total) by mouth 3 (three) times daily.   Indication:  Nerve Pain     insulin aspart 100 UNIT/ML injection  Commonly known as:  novoLOG  Inject 0-10 Units into the skin See admin instructions. 4 times daily before each meal, 140-199 - 2 units, 200-250 - 4 units, 251-299 - 6 units, 300-349 - 8 units, 350 or above 10 units     Type 2 diabetes   insulin glargine 100 UNIT/ML injection  Commonly known as:  LANTUS  Inject 0.3 mLs (30 Units total) into the skin at bedtime. Was taking this as a home med.   Indication:  Type 2 Diabetes     lisinopril 10 MG tablet  Commonly known as:  PRINIVIL,ZESTRIL  Take 1 tablet (10 mg total) by mouth daily. For hypertension.   Indication:  High Blood Pressure     metFORMIN 500 MG tablet  Commonly known as:  GLUCOPHAGE  Take 500 mg by mouth 2 (two) times daily with a meal.     Type 2 DM   mirtazapine 15 MG tablet  Commonly known as:  REMERON  Take 1 tablet (15 mg total) by mouth at bedtime.   Indication:  Trouble Sleeping     phenytoin 100 MG ER capsule  Commonly known as:  DILANTIN  Take 2 capsules (200 mg total) by mouth at bedtime. For prevention of seizures.   Indication:  Seizure     simvastatin 10 MG tablet  Commonly known as:  ZOCOR  Take 1 tablet (10 mg total) by mouth daily at 6 PM.   Indication:  Increased Fats, Triglycerides & Cholesterol in the Blood       Follow-up Information   Follow up with [facility] On 07/09/2012. [facility] will pick patient up today)    Contact information:   [address] [place] [address] [facility] [phone]      Follow-up recommendations:   Activities: Resume activity as tolerated. Diet: Heart healthy low sodium diet Tests: Follow up testing will be determined  by your out patient provider. Continue to work your relapse prevention plan Comments:    Total Discharge Time:  Greater than 30 minutes.  Signed: Rona Ravens. Mashburn RPAC 4:29 PM 07/10/2012

## 2012-07-09 NOTE — [facility] Suicide Risk Assessment (Signed)
Suicide Risk Assessment  Discharge Assessment     Demographic Factors:  Male  Mental Status Per Nursing Assessment::   On Admission:  NA  Current Mental Status by Physician: In full contact with reality. There are no suicidal ideas, plans or intent. His mood is worried, his affect is appropriate. He is willing and motivated to pursue further residential treatment   Loss Factors: Decline in physical health  Historical Factors: NA  Risk Reduction Factors:   Sense of responsibility to family  Continued Clinical Symptoms:  Depression:   Comorbid alcohol abuse/dependence Alcohol/Substance Abuse/Dependencies  Cognitive Features That Contribute To Risk:  Closed-mindedness Polarized thinking Thought constriction (tunnel vision)    Suicide Risk:  Minimal: No identifiable suicidal ideation.  Patients presenting with no risk factors but with morbid ruminations; may be classified as minimal risk based on the severity of the depressive symptoms  Discharge Diagnoses:   AXIS I:  Major Depression/Alcohol Dependence AXIS II:  Deferred AXIS III:   Past Medical History  Diagnosis Date  . Diabetes mellitus   . Hypertension   . Seizures   . Pancreatitis   . Alcohol abuse   . Chronic abdominal pain   . Alcoholism /alcohol abuse   . Neuromuscular disorder 07/02/2012    Diabetic neuropathy  . Headache(784.0)    AXIS IV:  housing problems and other psychosocial or environmental problems AXIS V:  61-70 mild symptoms  Plan Of Care/Follow-up recommendations:  Activity:  As tolerated Diet:  regular Follow up at [facility] today Is patient on multiple antipsychotic therapies at discharge:  No   Has Patient had three or more failed trials of antipsychotic monotherapy by history:  No  Recommended Plan for Multiple Antipsychotic Therapies: N/A   Tama Grosz A 07/09/2012, 12:30 PM

## 2012-07-09 NOTE — Progress Notes (Signed)
[facility] Group Notes:  (Nursing/MHT/Case Management/Adjunct)  Date:  07/09/2012  Time:  11:17 AM  Type of Therapy:  Therapeutic Actvitiy  Participation Level:  Active  Participation Quality:  Appropriate and Attentive  Affect:  Appropriate  Cognitive:  Appropriate  Insight:  Appropriate and Good  Engagement in Group:  Engaged  Modes of Intervention:  Activity, Discussion, Education and Socialization  Summary of Progress/Problems: Adien attended and participated in therapeutic activity of apples to apples. Patient was able to play the game where the participants had to guess the best card to fit the judges card throughout the game. Patient was appropriate and shared during group.    Karleen Hampshire Brittini 07/09/2012, 11:17 AM

## 2012-07-09 NOTE — B[facility] Group Notes (Signed)
[facility] LCSW Aftercare Discharge Planning Group Note   07/09/2012  8:45 AM  Participation Quality:  Appropriate  Mood/Affect:  Anxious  Depression Rating:  4  Anxiety Rating:  9  Thoughts of Suicide:  No Will you contract for safety?   NA  Current AVH:  No  Plan for Discharge/Comments:   Referring patient to [facility] today, plan B of [facility]  Transportation Means: Uncertain  Supports: None other than patients and staff here  Gabriel Rush

## 2012-07-09 NOTE — Progress Notes (Signed)
Patient ID: Gabriel Rush, male   DOB: 1960/05/04, 52 y.o.   MRN: [id number]  Patient discharged per MD orders. Pt supplied with sample medications per pharmacy; discharge instructions including follow up information provided. Pt verbalized an understanding of instructions. All belongings returned to patient. No s/s of distress noted at this time.

## 2012-07-09 NOTE — [facility] Suicide Risk Assessment (Signed)
[facility] INPATIENT:  Family/Significant Other Suicide Prevention Education  Suicide Prevention Education:  Patient Discharged to Other Healthcare Facility:  Suicide Prevention Education Not Provided: {PT. DISCHARGED TO OTHER HEALTHCARE FACILITY:SUICIDE PREVENTION EDUCATION NOT PROVIDED ([facility]):  The patient is discharging to another healthcare facility for continuation of treatment.  The patient's medical information, including suicide ideations and risk factors, are a part of the medical information shared with the receiving healthcare facility.  Gabriel Rush 07/09/2012, 6:38 PM

## 2012-07-09 NOTE — Progress Notes (Signed)
Adult Psychoeducational Group Note  Date:  07/09/2012 Time:  12:06 PM  Group Topic/Focus:  Wellness Toolbox:   The focus of this group is to discuss various aspects of wellness, balancing those aspects and exploring ways to increase the ability to experience wellness.  Patients will create a wellness toolbox for use upon discharge.  Participation Level:  Active  Participation Quality:  Appropriate, Attentive and Sharing  Affect:  Appropriate  Cognitive:  Alert and Appropriate  Insight: Appropriate  Engagement in Group:  Engaged  Modes of Intervention:  Activity  Additional Comments:  Pt was appropriate and sharing while attending group. Pt participated in pictionary that displayed several different coping skills for wellness. Pt shared that reading was a coping skill for him.   Sharyn Lull 07/09/2012, 12:06 PM

## 2012-07-09 NOTE — Tx Team (Signed)
Interdisciplinary Treatment Plan Update (Adult)  Date: 07/09/2012  Time Reviewed:  10:12 AM  Progress in Treatment: Attending groups: Yes Participating in groups: Yes Taking medication as prescribed:  Yes Tolerating medication:  Yes Family/Significant othe contact made: No due to lack of supports Patient understands diagnosis: Yes Discussing patient identified problems/goals with staff: Yes Medical problems stabilized or resolved:  Yes Denies suicidal/homicidal ideation: Yes Patient has not harmed self or Others: Yes  New problem(s) identified: None Identified  Discharge Plan or Barriers: None, patient discharging today to [facility]   Additional comments: N/A  Reason for Continuation of Hospitalization: None  Estimated length of stay: DC today  For review of initial/current patient goals, please see plan of care.  Attendees: Patient:     Family:     Physician:  Geoffery Lyons 07/09/2012  10:12 AM  Nursing:   Roswell Miners, RN 07/09/2012  10:12 AM  Clinical Social Worker Ronda Fairly 07/09/2012  10:12 AM  Other:   Jerelene Redden PA student 07/09/2012  10:12 AM   Other:   07/09/2012    Other:   07/09/2012    Other:   07/09/2012     Scribe for Treatment Team:   Carney Bern, LCSWA  07/09/2012  10:12 AM

## 2012-07-10 ENCOUNTER — Emergency Department (HOSPITAL_BASED_OUTPATIENT_CLINIC_OR_DEPARTMENT_OTHER)
Admission: EM | Admit: 2012-07-10 | Discharge: 2012-07-10 | Disposition: A | Discharge status: Home / Self Care | Payer: Self-pay | Attending: Emergency Medicine | Admitting: Emergency Medicine

## 2012-07-10 ENCOUNTER — Encounter (HOSPITAL_BASED_OUTPATIENT_CLINIC_OR_DEPARTMENT_OTHER): Payer: Self-pay | Admitting: Emergency Medicine

## 2012-07-10 DIAGNOSIS — F1021 Alcohol dependence, in remission: Secondary | ICD-10-CM | POA: Insufficient documentation

## 2012-07-10 DIAGNOSIS — I10 Essential (primary) hypertension: Secondary | ICD-10-CM | POA: Insufficient documentation

## 2012-07-10 DIAGNOSIS — F411 Generalized anxiety disorder: Secondary | ICD-10-CM | POA: Insufficient documentation

## 2012-07-10 DIAGNOSIS — Z8669 Personal history of other diseases of the nervous system and sense organs: Secondary | ICD-10-CM | POA: Insufficient documentation

## 2012-07-10 DIAGNOSIS — Z87891 Personal history of nicotine dependence: Secondary | ICD-10-CM | POA: Insufficient documentation

## 2012-07-10 DIAGNOSIS — Z139 Encounter for screening, unspecified: Secondary | ICD-10-CM

## 2012-07-10 DIAGNOSIS — Z794 Long term (current) use of insulin: Secondary | ICD-10-CM | POA: Insufficient documentation

## 2012-07-10 DIAGNOSIS — G8929 Other chronic pain: Secondary | ICD-10-CM | POA: Insufficient documentation

## 2012-07-10 DIAGNOSIS — E1149 Type 2 diabetes mellitus with other diabetic neurological complication: Secondary | ICD-10-CM | POA: Insufficient documentation

## 2012-07-10 DIAGNOSIS — E119 Type 2 diabetes mellitus without complications: Secondary | ICD-10-CM

## 2012-07-10 DIAGNOSIS — E1142 Type 2 diabetes mellitus with diabetic polyneuropathy: Secondary | ICD-10-CM | POA: Insufficient documentation

## 2012-07-10 DIAGNOSIS — G40909 Epilepsy, unspecified, not intractable, without status epilepticus: Secondary | ICD-10-CM | POA: Insufficient documentation

## 2012-07-10 DIAGNOSIS — Z79899 Other long term (current) drug therapy: Secondary | ICD-10-CM | POA: Insufficient documentation

## 2012-07-10 DIAGNOSIS — R109 Unspecified abdominal pain: Secondary | ICD-10-CM | POA: Insufficient documentation

## 2012-07-10 DIAGNOSIS — Z8719 Personal history of other diseases of the digestive system: Secondary | ICD-10-CM | POA: Insufficient documentation

## 2012-07-10 LAB — GLUCOSE, CAPILLARY: Glucose-Capillary: 188 mg/dL — ABNORMAL HIGH (ref 70–99)

## 2012-07-10 LAB — STOOL CULTURE: Special Requests: NORMAL

## 2012-07-10 NOTE — ED Notes (Signed)
This RN reprinted the AVS from his [facility] admission to provide the patient with his sliding scale information regarding insulin admin.  Dr Oletta Lamas states that he does not need to see the patient because his blood sugar is under control and he simply needed his discharge paperwork reprinted.  The patient and caregiver were educated on the proper admin of insulin sliding scale and other medications questions were answered.  Pt has returned to [facility] at this time.

## 2012-07-10 NOTE — Discharge Instructions (Signed)
 Please follow instructions for medications and follow up as given to you yesterday

## 2012-07-10 NOTE — ED Provider Notes (Signed)
History     CSN: [id number]  Arrival date & time 07/10/12  1049   First MD Initiated Contact with Patient 07/10/12 1101      No chief complaint on file.   (Consider location/radiation/quality/duration/timing/severity/associated sxs/prior treatment) HPI Comments: Pt with h/o alcoholism, diabetes, was at [facility] for about 1 week, was released to [facility] yesterday.  Pt has DM, staff who came with pt reports pt knows nothing about how he is supposed to take his insulin, caregiver reports she doesn't know if he came with any paperwork or discharge paperwork or prescriptions.  Pt reports he was given numerous free medications in a bag to take with him.  No N/V.  Blood sugars were in the upper 100's and low 200's today.  No N/V/D.    The history is provided by the patient, medical records and a caregiver.    Past Medical History  Diagnosis Date  . Diabetes mellitus   . Hypertension   . Seizures   . Pancreatitis   . Alcohol abuse   . Chronic abdominal pain   . Alcoholism /alcohol abuse   . Neuromuscular disorder 07/02/2012    Diabetic neuropathy  . [id number](956.2)     Past Surgical History  Procedure Laterality Date  . Appendectomy      Family History  Problem Relation Age of Onset  . Hypertension      History  Substance Use Topics  . Smoking status: Former Smoker    Types: Cigarettes    Quit date: 12/21/1984  . Smokeless tobacco: Never Used  . Alcohol Use: Yes     Comment: heavy drinker: binge drinking per pt      Review of Systems  Constitutional: Negative for fever and chills.  Gastrointestinal: Negative for nausea, vomiting and abdominal pain.  Psychiatric/Behavioral: The patient is nervous/anxious.     Allergies  Morphine and related  Home Medications   Current Outpatient Rx  Name  Route  Sig  Dispense  Refill  . amylase-lipase-protease (LIPRAM-CR10) 33.03-20-35.5 MU per capsule   Oral   Take 2 capsules by mouth 3 (three) times daily with meals.   180  capsule   0   . gabapentin (NEURONTIN) 300 MG capsule   Oral   Take 1 capsule (300 mg total) by mouth 3 (three) times daily.   90 capsule   0   . insulin aspart (NOVOLOG) 100 UNIT/ML injection   Subcutaneous   Inject 0-10 Units into the skin See admin instructions. 4 times daily before each meal, 140-199 - 2 units, 200-250 - 4 units, 251-299 - 6 units, 300-349 - 8 units, 350 or above 10 units         . insulin glargine (LANTUS) 100 UNIT/ML injection   Subcutaneous   Inject 0.3 mLs (30 Units total) into the skin at bedtime. Was taking this as a home med.   10 mL   12   . lisinopril (PRINIVIL,ZESTRIL) 10 MG tablet   Oral   Take 1 tablet (10 mg total) by mouth daily. For hypertension.   30 tablet   0   . metFORMIN (GLUCOPHAGE) 500 MG tablet   Oral   Take 500 mg by mouth 2 (two) times daily with a meal.         . mirtazapine (REMERON) 15 MG tablet   Oral   Take 1 tablet (15 mg total) by mouth at bedtime.   30 tablet   0   . phenytoin (DILANTIN) 100 MG ER capsule  Oral   Take 2 capsules (200 mg total) by mouth at bedtime. For prevention of seizures.   60 capsule   6   . simvastatin (ZOCOR) 10 MG tablet   Oral   Take 1 tablet (10 mg total) by mouth daily at 6 PM.   30 tablet   0     BP 126/87  Pulse 85  Temp(Src) 97.9 F (36.6 C) (Oral)  Resp 20  SpO2 99%  Physical Exam  Nursing note and vitals reviewed. Constitutional: He is oriented to person, place, and time. He appears well-developed and well-nourished. No distress.  HENT:  Head: Normocephalic and atraumatic.  Eyes: Conjunctivae are normal. No scleral icterus.  Neck: Normal range of motion. Neck supple.  Cardiovascular: Normal rate and intact distal pulses.   Pulmonary/Chest: Effort normal. No respiratory distress.  Abdominal: Soft.  Neurological: He is alert and oriented to person, place, and time. Coordination normal.  Skin: Skin is warm and dry. He is not diaphoretic.  Psychiatric: He has a  normal mood and affect.    ED Course  Procedures (including critical care time)  Labs Reviewed  GLUCOSE, CAPILLARY - Abnormal; Notable for the following:    Glucose-Capillary 188 (*)    All other components within normal limits   No results found.   1. Screening   2. Diabetes mellitus    ra sat is 99% and I interpret to be normal   MDM  I reviewed prior records.  Pt has a discharge summary that shows all of his discharge medications, how he is supposed to take insulin.  The paperwork is reprinted here and provided to patient and staff member that came with him.  No evidence of medical emergency.  Pt has been screened, instructions provided and reiterated to follow instructions given to him yesterday.        Gavin Pound. Oiva Dibari, MD 07/10/12 1137

## 2012-07-11 NOTE — Progress Notes (Signed)
Patient Discharge Instructions:  After Visit Summary (AVS):   Faxed to:  07/11/12 Discharge Summary Note:   Faxed to:  07/11/12 Psychiatric Admission Assessment Note:   Faxed to:  07/11/12 Suicide Risk Assessment - Discharge Assessment:   Faxed to:  07/11/12 Faxed/Sent to the Next Level Care provider:  07/11/12 Faxed to [facility] @ [phone]  Jerelene Redden, 07/11/2012, 4:10 PM

## 2012-07-20 ENCOUNTER — Emergency Department (HOSPITAL_BASED_OUTPATIENT_CLINIC_OR_DEPARTMENT_OTHER): Payer: MEDICAID

## 2012-07-20 ENCOUNTER — Encounter (HOSPITAL_BASED_OUTPATIENT_CLINIC_OR_DEPARTMENT_OTHER): Payer: Self-pay | Admitting: Emergency Medicine

## 2012-07-20 ENCOUNTER — Emergency Department (HOSPITAL_BASED_OUTPATIENT_CLINIC_OR_DEPARTMENT_OTHER)
Admission: EM | Admit: 2012-07-20 | Discharge: 2012-07-20 | Disposition: A | Discharge status: Home / Self Care | Payer: MEDICAID | Attending: Emergency Medicine | Admitting: Emergency Medicine

## 2012-07-20 DIAGNOSIS — Z8719 Personal history of other diseases of the digestive system: Secondary | ICD-10-CM | POA: Insufficient documentation

## 2012-07-20 DIAGNOSIS — Z87891 Personal history of nicotine dependence: Secondary | ICD-10-CM | POA: Insufficient documentation

## 2012-07-20 DIAGNOSIS — R197 Diarrhea, unspecified: Secondary | ICD-10-CM | POA: Insufficient documentation

## 2012-07-20 DIAGNOSIS — E1169 Type 2 diabetes mellitus with other specified complication: Secondary | ICD-10-CM | POA: Insufficient documentation

## 2012-07-20 DIAGNOSIS — Z8679 Personal history of other diseases of the circulatory system: Secondary | ICD-10-CM | POA: Insufficient documentation

## 2012-07-20 DIAGNOSIS — I10 Essential (primary) hypertension: Secondary | ICD-10-CM | POA: Insufficient documentation

## 2012-07-20 DIAGNOSIS — R739 Hyperglycemia, unspecified: Secondary | ICD-10-CM

## 2012-07-20 DIAGNOSIS — R51 Headache: Secondary | ICD-10-CM | POA: Insufficient documentation

## 2012-07-20 DIAGNOSIS — Z8669 Personal history of other diseases of the nervous system and sense organs: Secondary | ICD-10-CM | POA: Insufficient documentation

## 2012-07-20 DIAGNOSIS — Z794 Long term (current) use of insulin: Secondary | ICD-10-CM | POA: Insufficient documentation

## 2012-07-20 DIAGNOSIS — Z79899 Other long term (current) drug therapy: Secondary | ICD-10-CM | POA: Insufficient documentation

## 2012-07-20 DIAGNOSIS — R000 Tachycardia, unspecified: Secondary | ICD-10-CM | POA: Insufficient documentation

## 2012-07-20 DIAGNOSIS — G8929 Other chronic pain: Secondary | ICD-10-CM | POA: Insufficient documentation

## 2012-07-20 DIAGNOSIS — R7309 Other abnormal glucose: Secondary | ICD-10-CM | POA: Insufficient documentation

## 2012-07-20 LAB — CBC WITH DIFFERENTIAL/PLATELET
Basophils Absolute: 0 10*3/uL (ref 0.0–0.1)
Basophils Relative: 0 % (ref 0–1)
Eosinophils Absolute: 0.1 10*3/uL (ref 0.0–0.7)
HCT: 39.2 % (ref 39.0–52.0)
Hemoglobin: 14.5 g/dL (ref 13.0–17.0)
Lymphs Abs: 1.4 10*3/uL (ref 0.7–4.0)
MCHC: 37 g/dL — ABNORMAL HIGH (ref 30.0–36.0)
MCV: 86.5 fL (ref 78.0–100.0)
Neutro Abs: 3 10*3/uL (ref 1.7–7.7)
RDW: 11 % — ABNORMAL LOW (ref 11.5–15.5)

## 2012-07-20 LAB — ETHANOL: Alcohol, Ethyl (B): <11 mg/dL (ref 0–11)

## 2012-07-20 LAB — COMPREHENSIVE METABOLIC PANEL
AST: 18 U/L (ref 0–37)
BUN: 14 mg/dL (ref 6–23)
CO2: 23 mEq/L (ref 19–32)
Calcium: 9.9 mg/dL (ref 8.4–10.5)
Chloride: 95 mEq/L — ABNORMAL LOW (ref 96–112)
Creatinine, Ser: 0.7 mg/dL (ref 0.50–1.35)
GFR calc non Af Amer: >90 mL/min (ref >90)
Total Bilirubin: 0.2 mg/dL — ABNORMAL LOW (ref 0.3–1.2)

## 2012-07-20 LAB — RAPID URINE DRUG SCREEN, HOSP PERFORMED
Cocaine: NOT DETECTED
Opiates: NOT DETECTED
Tetrahydrocannabinol: NOT DETECTED

## 2012-07-20 LAB — GLUCOSE, CAPILLARY: Glucose-Capillary: 278 mg/dL — ABNORMAL HIGH (ref 70–99)

## 2012-07-20 MED ORDER — INSULIN REGULAR HUMAN 100 UNIT/ML IJ SOLN
10.0000 [IU] | Freq: Once | INTRAMUSCULAR | Status: AC
  Administered 2012-07-20: 10 [IU] via SUBCUTANEOUS

## 2012-07-20 MED ORDER — INSULIN REGULAR HUMAN 100 UNIT/ML IJ SOLN
INTRAMUSCULAR | Status: AC
  Filled 2012-07-20: qty 10

## 2012-07-20 MED ORDER — SODIUM CHLORIDE 0.9 % IV BOLUS (SEPSIS)
1000.0000 mL | Freq: Once | INTRAVENOUS | Status: AC
  Administered 2012-07-20: 1000 mL via INTRAVENOUS

## 2012-07-20 NOTE — ED Notes (Signed)
Pt from [facility].  Sts his cbg was 480 there.  Diarrhea since noon. Severe HA x2 hours.

## 2012-07-20 NOTE — ED Provider Notes (Signed)
History     CSN: [id number]  Arrival date & time 07/20/12  1706   First MD Initiated Contact with Patient 07/20/12 1739      Chief Complaint  Patient presents with  . Hyperglycemia  . Diarrhea  . Headache    (Consider location/radiation/quality/duration/timing/severity/associated sxs/prior treatment) HPI  Patient presents today complaining of diarrhea intermittently for several days, hyperglycemia, and headache that began today. He is at [facility] for detox from alcohol. He is being treated for his diabetes with insulin. He states that he ate hamburgers and hot dogs with chips for lunch. He has not been eating as advised. His blood sugar was noted to be elevated today and he was sent here for evaluation. He states he has some headache but this is typical when his blood sugar is elevated. He denies head injury, neck pain, chest pain, vomiting, fever, or chills. He states he has had a few loose stools earlier in the week.  Past Medical History  Diagnosis Date  . Diabetes mellitus   . Hypertension   . Seizures   . Pancreatitis   . Alcohol abuse   . Chronic abdominal pain   . Alcoholism /alcohol abuse   . Neuromuscular disorder 07/02/2012    Diabetic neuropathy  . WUJWJXBJ(478.2)     Past Surgical History  Procedure Laterality Date  . Appendectomy      Family History  Problem Relation Age of Onset  . Hypertension      History  Substance Use Topics  . Smoking status: Former Smoker    Types: Cigarettes    Quit date: 12/21/1984  . Smokeless tobacco: Never Used  . Alcohol Use: Yes     Comment: heavy drinker: binge drinking per pt      Review of Systems  All other systems reviewed and are negative.    Allergies  Morphine and related  Home Medications   Current Outpatient Rx  Name  Route  Sig  Dispense  Refill  . amylase-lipase-protease (LIPRAM-CR10) 33.03-20-35.5 MU per capsule   Oral   Take 2 capsules by mouth 3 (three) times daily with meals.   180  capsule   0   . gabapentin (NEURONTIN) 300 MG capsule   Oral   Take 1 capsule (300 mg total) by mouth 3 (three) times daily.   90 capsule   0   . insulin aspart (NOVOLOG) 100 UNIT/ML injection   Subcutaneous   Inject 0-10 Units into the skin See admin instructions. 4 times daily before each meal, 140-199 - 2 units, 200-250 - 4 units, 251-299 - 6 units, 300-349 - 8 units, 350 or above 10 units         . insulin glargine (LANTUS) 100 UNIT/ML injection   Subcutaneous   Inject 0.3 mLs (30 Units total) into the skin at bedtime. Was taking this as a home med.   10 mL   12   . lisinopril (PRINIVIL,ZESTRIL) 10 MG tablet   Oral   Take 1 tablet (10 mg total) by mouth daily. For hypertension.   30 tablet   0   . metFORMIN (GLUCOPHAGE) 500 MG tablet   Oral   Take 500 mg by mouth 2 (two) times daily with a meal.         . mirtazapine (REMERON) 15 MG tablet   Oral   Take 1 tablet (15 mg total) by mouth at bedtime.   30 tablet   0   . phenytoin (DILANTIN) 100 MG ER capsule  Oral   Take 2 capsules (200 mg total) by mouth at bedtime. For prevention of seizures.   60 capsule   6   . simvastatin (ZOCOR) 10 MG tablet   Oral   Take 1 tablet (10 mg total) by mouth daily at 6 PM.   30 tablet   0     BP 131/92  Pulse 118  Temp(Src) 98.4 F (36.9 C) (Oral)  Ht 5\' 10"  (1.778 m)  Wt 180 lb (81.647 kg)  BMI 25.83 kg/m2  SpO2 100%  Physical Exam  Nursing note and vitals reviewed. Constitutional: He is oriented to person, place, and time. He appears well-developed and well-nourished.  HENT:  Head: Normocephalic and atraumatic.  Right Ear: External ear normal.  Left Ear: External ear normal.  Mouth/Throat: Oropharynx is clear and moist.  Eyes: Conjunctivae are normal. Pupils are equal, round, and reactive to light.  Neck: Normal range of motion. Neck supple.  Cardiovascular: Tachycardia present.   Pulmonary/Chest: Effort normal and breath sounds normal.  Abdominal: Soft.  Bowel sounds are normal.  Musculoskeletal: Normal range of motion.  Neurological: He is alert and oriented to person, place, and time. He has normal reflexes.  Skin: Skin is warm and dry.  Psychiatric: He has a normal mood and affect. His behavior is normal.    ED Course  Procedures (including critical care time)  Labs Reviewed  GLUCOSE, CAPILLARY - Abnormal; Notable for the following:    Glucose-Capillary 410 (*)    All other components within normal limits  CBC WITH DIFFERENTIAL - Abnormal; Notable for the following:    MCHC 37.0 (*)    RDW 11.0 (*)    All other components within normal limits  COMPREHENSIVE METABOLIC PANEL - Abnormal; Notable for the following:    Sodium 132 (*)    Chloride 95 (*)    Glucose, Bld 409 (*)    Total Bilirubin 0.2 (*)    All other components within normal limits  GLUCOSE, CAPILLARY - Abnormal; Notable for the following:    Glucose-Capillary 332 (*)    All other components within normal limits  ETHANOL  URINE RAPID DRUG SCREEN (HOSP PERFORMED)   Ct Head Wo Contrast  07/20/2012   *RADIOLOGY REPORT*  Clinical Data: Severe headache  CT HEAD WITHOUT CONTRAST  Technique:  Contiguous axial images were obtained from the base of the skull through the vertex without contrast.  Comparison: 05/28/2012  Findings: No skull fracture is noted.  No intracranial hemorrhage, mass effect or midline shift.  No acute infarction.  No mass lesion is noted on this unenhanced scan. There is a nodular soft tissue prominence in the right upper posterior scalp.  Measures about 2.2 cm.  Clinical correlation is necessary to exclude neoplastic process.  Paranasal sinuses and mastoid air cells are unremarkable.  IMPRESSION: No acute intracranial abnormality.  Nodular soft tissue prominence in the right upper posterior scalp measures about 2.2 cm.  Clinical correlation is necessary to exclude a  neoplastic process.  Please see patient recent MRI 05/29/2012.   Original Report  Authenticated By: Natasha Mead, M.D.     No diagnosis found.   Patient with elevated blood sugar but no evidence of DKA. Head CT shows no evident evidence of acute intracranial abnormality. Patient has received 1 L of normal saline and IV insulin. Airway being a recheck of blood sugar. He has not had any loose stool while here in emergency department. MDM   Filed Vitals:   07/20/12 1718 07/20/12 1748  07/20/12 2204  BP:  131/92 115/81  Pulse:  118 96  Temp:  98.4 F (36.9 C) 98.1 F (36.7 C)  TempSrc:  Oral Oral  Resp:   16  Height: 5\' 10"  (1.778 m)    Weight: 180 lb (81.647 kg)    SpO2:  100% 100%        Patient's vital signs remained hemodynamically stable. His blood sugar has decreased. He is advised regarding diet.  Hilario Quarry, MD 07/20/12 2242

## 2012-07-25 ENCOUNTER — Encounter (HOSPITAL_COMMUNITY): Payer: Self-pay | Admitting: Emergency Medicine

## 2012-07-25 ENCOUNTER — Emergency Department (HOSPITAL_COMMUNITY)
Admission: EM | Admit: 2012-07-25 | Discharge: 2012-07-25 | Disposition: A | Discharge status: Home / Self Care | Payer: Self-pay | Attending: Emergency Medicine | Admitting: Emergency Medicine

## 2012-07-25 DIAGNOSIS — R1084 Generalized abdominal pain: Secondary | ICD-10-CM | POA: Insufficient documentation

## 2012-07-25 DIAGNOSIS — Z794 Long term (current) use of insulin: Secondary | ICD-10-CM | POA: Insufficient documentation

## 2012-07-25 DIAGNOSIS — F1020 Alcohol dependence, uncomplicated: Secondary | ICD-10-CM | POA: Insufficient documentation

## 2012-07-25 DIAGNOSIS — G8929 Other chronic pain: Secondary | ICD-10-CM | POA: Insufficient documentation

## 2012-07-25 DIAGNOSIS — Z8719 Personal history of other diseases of the digestive system: Secondary | ICD-10-CM | POA: Insufficient documentation

## 2012-07-25 DIAGNOSIS — Z87891 Personal history of nicotine dependence: Secondary | ICD-10-CM | POA: Insufficient documentation

## 2012-07-25 DIAGNOSIS — Z79899 Other long term (current) drug therapy: Secondary | ICD-10-CM | POA: Insufficient documentation

## 2012-07-25 DIAGNOSIS — R197 Diarrhea, unspecified: Secondary | ICD-10-CM | POA: Insufficient documentation

## 2012-07-25 DIAGNOSIS — G40909 Epilepsy, unspecified, not intractable, without status epilepticus: Secondary | ICD-10-CM | POA: Insufficient documentation

## 2012-07-25 DIAGNOSIS — R112 Nausea with vomiting, unspecified: Secondary | ICD-10-CM | POA: Insufficient documentation

## 2012-07-25 DIAGNOSIS — F1410 Cocaine abuse, uncomplicated: Secondary | ICD-10-CM | POA: Insufficient documentation

## 2012-07-25 DIAGNOSIS — E1149 Type 2 diabetes mellitus with other diabetic neurological complication: Secondary | ICD-10-CM | POA: Insufficient documentation

## 2012-07-25 DIAGNOSIS — G709 Myoneural disorder, unspecified: Secondary | ICD-10-CM | POA: Insufficient documentation

## 2012-07-25 DIAGNOSIS — E1142 Type 2 diabetes mellitus with diabetic polyneuropathy: Secondary | ICD-10-CM | POA: Insufficient documentation

## 2012-07-25 DIAGNOSIS — I10 Essential (primary) hypertension: Secondary | ICD-10-CM | POA: Insufficient documentation

## 2012-07-25 DIAGNOSIS — R52 Pain, unspecified: Secondary | ICD-10-CM | POA: Insufficient documentation

## 2012-07-25 DIAGNOSIS — R5381 Other malaise: Secondary | ICD-10-CM | POA: Insufficient documentation

## 2012-07-25 LAB — COMPREHENSIVE METABOLIC PANEL
Albumin: 3.7 g/dL (ref 3.5–5.2)
Alkaline Phosphatase: 94 U/L (ref 39–117)
BUN: 10 mg/dL (ref 6–23)
CO2: 28 mEq/L (ref 19–32)
Chloride: 93 mEq/L — ABNORMAL LOW (ref 96–112)
Creatinine, Ser: 0.72 mg/dL (ref 0.50–1.35)
GFR calc non Af Amer: >90 mL/min (ref >90)
Glucose, Bld: 358 mg/dL — ABNORMAL HIGH (ref 70–99)
Potassium: 4.6 mEq/L (ref 3.5–5.1)
Total Bilirubin: 0.2 mg/dL — ABNORMAL LOW (ref 0.3–1.2)

## 2012-07-25 LAB — CBC WITH DIFFERENTIAL/PLATELET
Basophils Absolute: 0.1 10*3/uL (ref 0.0–0.1)
Eosinophils Relative: 2 % (ref 0–5)
HCT: 38.2 % — ABNORMAL LOW (ref 39.0–52.0)
Hemoglobin: 13.7 g/dL (ref 13.0–17.0)
Lymphocytes Relative: 36 % (ref 12–46)
Lymphs Abs: 1.6 10*3/uL (ref 0.7–4.0)
MCV: 84.9 fL (ref 78.0–100.0)
Monocytes Absolute: 0.4 10*3/uL (ref 0.1–1.0)
Monocytes Relative: 9 % (ref 3–12)
Neutro Abs: 2.2 10*3/uL (ref 1.7–7.7)
RBC: 4.5 MIL/uL (ref 4.22–5.81)
WBC: 4.3 10*3/uL (ref 4.0–10.5)

## 2012-07-25 LAB — POCT I-STAT, CHEM 8
BUN: 12 mg/dL (ref 6–23)
Calcium, Ion: 1.2 mmol/L (ref 1.12–1.23)
Chloride: 95 mEq/L — ABNORMAL LOW (ref 96–112)
Creatinine, Ser: 0.7 mg/dL (ref 0.50–1.35)
Glucose, Bld: 375 mg/dL — ABNORMAL HIGH (ref 70–99)

## 2012-07-25 LAB — URINALYSIS, ROUTINE W REFLEX MICROSCOPIC
Glucose, UA: >1000 mg/dL — AB
Ketones, ur: NEGATIVE mg/dL
Leukocytes, UA: NEGATIVE
Protein, ur: NEGATIVE mg/dL
Urobilinogen, UA: 0.2 mg/dL (ref 0.0–1.0)

## 2012-07-25 MED ORDER — SODIUM CHLORIDE 0.9 % IV SOLN
1000.0000 mL | Freq: Once | INTRAVENOUS | Status: AC
  Administered 2012-07-25: 1000 mL via INTRAVENOUS

## 2012-07-25 MED ORDER — DIPHENHYDRAMINE HCL 50 MG/ML IJ SOLN
25.0000 mg | Freq: Once | INTRAMUSCULAR | Status: AC
  Administered 2012-07-25: 25 mg via INTRAVENOUS
  Filled 2012-07-25: qty 1

## 2012-07-25 MED ORDER — METHYLPREDNISOLONE SODIUM SUCC 125 MG IJ SOLR
125.0000 mg | Freq: Once | INTRAMUSCULAR | Status: AC
  Administered 2012-07-25: 125 mg via INTRAVENOUS
  Filled 2012-07-25: qty 2

## 2012-07-25 MED ORDER — ONDANSETRON HCL 4 MG PO TABS: 4.0000 mg | ORAL_TABLET | Freq: Three times a day (TID) | ORAL | Status: DC | PRN

## 2012-07-25 MED ORDER — FAMOTIDINE IN NACL 20-0.9 MG/50ML-% IV SOLN
20.0000 mg | Freq: Once | INTRAVENOUS | Status: AC
  Administered 2012-07-25: 20 mg via INTRAVENOUS
  Filled 2012-07-25: qty 50

## 2012-07-25 MED ORDER — SODIUM CHLORIDE 0.9 % IV SOLN
1000.0000 mL | INTRAVENOUS | Status: DC
  Administered 2012-07-25: 1000 mL via INTRAVENOUS

## 2012-07-25 NOTE — ED Notes (Signed)
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 Pt reports persistent diarrhea, nausea, vomiting for the last 3 weeks. Appears weak, fatigued, alert, oriented x4.

## 2012-07-25 NOTE — ED Notes (Signed)
Pt. Given diet gingerale and apple sauce.  Pt sts feeling jittery. Fine tremors noted.

## 2012-07-25 NOTE — ED Provider Notes (Signed)
History     CSN: [id number]  Arrival date & time 07/25/12  1022   First MD Initiated Contact with Patient 07/25/12 1108      Chief Complaint  Patient presents with  . Diarrhea  . Emesis  . Weakness    (Consider location/radiation/quality/duration/timing/severity/associated sxs/prior treatment) HPI Comments: Patient presents to the emergency department with chief complaint of nausea, vomiting, and diarrhea for the past 3 weeks. He states that he is been being treated at [facility] rehabilitation facility for cocaine and alcohol abuse. He states that approximately 3 weeks ago he was restarted on all of his medications, and this is when his symptoms began. He states that he has had loose watery diarrhea multiple times per day, insisting that he uses the bathroom several times per hour. He also states that he has vomited at least twice a day for the past 3 weeks. Additionally, he complains of generalized body aches and pains. He also endorses a 30 pound weight loss. He has not tried anything to alleviate his symptoms. He denies running any fevers, having any chills, denies chest pain, shortness of breath, or constipation. Nothing makes his symptoms better or worse, however, he does believe that it is due to his medications.  The history is provided by the patient. No language interpreter was used.    Past Medical History  Diagnosis Date  . Diabetes mellitus   . Hypertension   . Seizures   . Pancreatitis   . Alcohol abuse   . Chronic abdominal pain   . Alcoholism /alcohol abuse   . Neuromuscular disorder 07/02/2012    Diabetic neuropathy  . BMWUXLKG(401.0)     Past Surgical History  Procedure Laterality Date  . Appendectomy      Family History  Problem Relation Age of Onset  . Hypertension      History  Substance Use Topics  . Smoking status: Former Smoker    Types: Cigarettes    Quit date: 12/21/1984  . Smokeless tobacco: Never Used  . Alcohol Use: Yes     Comment: heavy  drinker: binge drinking per pt      Review of Systems  All other systems reviewed and are negative.    Allergies  Morphine and related  Home Medications   Current Outpatient Rx  Name  Route  Sig  Dispense  Refill  . alum & mag hydroxide-simeth (MAALOX/MYLANTA) 200-200-20 MG/5ML suspension   Oral   Take 30 mLs by mouth every 4 (four) hours as needed for indigestion.         . diphenhydrAMINE (BENADRYL) 25 mg capsule   Oral   Take 25 mg by mouth every 6 (six) hours as needed.         . gabapentin (NEURONTIN) 300 MG capsule   Oral   Take 1 capsule (300 mg total) by mouth 3 (three) times daily.   90 capsule   0   . insulin aspart (NOVOLOG) 100 UNIT/ML injection   Subcutaneous   Inject 0-10 Units into the skin See admin instructions. 4 times daily before each meal, 140-199 - 2 units, 200-250 - 4 units, 251-299 - 6 units, 300-349 - 8 units, 350 or above 10 units         . insulin glargine (LANTUS) 100 UNIT/ML injection   Subcutaneous   Inject 0.3 mLs (30 Units total) into the skin at bedtime. Was taking this as a home med.   10 mL   12   . lipase/protease/amylase (CREON-12/PANCREASE) 12000  UNITS CPEP   Oral   Take 2 capsules by mouth 3 (three) times daily.         Marland Kitchen lisinopril (PRINIVIL,ZESTRIL) 10 MG tablet   Oral   Take 1 tablet (10 mg total) by mouth daily. For hypertension.   30 tablet   0   . loperamide (IMODIUM) 2 MG capsule   Oral   Take 2 mg by mouth 4 (four) times daily as needed for diarrhea or loose stools.         . metFORMIN (GLUCOPHAGE) 500 MG tablet   Oral   Take 500 mg by mouth 2 (two) times daily with a meal.         . mirtazapine (REMERON) 15 MG tablet   Oral   Take 1 tablet (15 mg total) by mouth at bedtime.   30 tablet   0   . naproxen sodium (ANAPROX) 220 MG tablet   Oral   Take 220 mg by mouth every 8 (eight) hours as needed.         . phenytoin (DILANTIN) 100 MG ER capsule   Oral   Take 2 capsules (200 mg total) by  mouth at bedtime. For prevention of seizures.   60 capsule   6   . simvastatin (ZOCOR) 10 MG tablet   Oral   Take 1 tablet (10 mg total) by mouth daily at 6 PM.   30 tablet   0     BP 120/97  Pulse 116  Temp(Src) 98.2 F (36.8 C) (Oral)  Resp 18  Ht 5\' 11"  (1.803 m)  Wt 180 lb (81.647 kg)  BMI 25.12 kg/m2  SpO2 100%  Physical Exam  Nursing note and vitals reviewed. Constitutional: He is oriented to person, place, and time. He appears well-developed and well-nourished.  HENT:  Head: Normocephalic and atraumatic.  Right Ear: External ear normal.  Left Ear: External ear normal.  Nose: Nose normal.  Mouth/Throat: Oropharynx is clear and moist. No oropharyngeal exudate.  Eyes: Conjunctivae and EOM are normal. Pupils are equal, round, and reactive to light. Right eye exhibits no discharge. Left eye exhibits no discharge. No scleral icterus.  Neck: Normal range of motion. Neck supple. No JVD present.  Cardiovascular: Normal rate, regular rhythm, normal heart sounds and intact distal pulses.  Exam reveals no gallop and no friction rub.   No murmur heard. Pulmonary/Chest: Effort normal and breath sounds normal. No respiratory distress. He has no wheezes. He has no rales. He exhibits no tenderness.  Abdominal: Soft. Bowel sounds are normal. He exhibits no distension and no mass. There is tenderness. There is no rebound and no guarding.  Diffuse abdominal tenderness, or more localized to the upper quadrants, no rebound tenderness, no masses, no guarding, no fluid wave, or signs of peritonitis  Musculoskeletal: Normal range of motion. He exhibits no edema and no tenderness.  Neurological: He is alert and oriented to person, place, and time. He has normal reflexes.  CN 3-12 intact  Skin: Skin is warm and dry.  Psychiatric: He has a normal mood and affect. His behavior is normal. Judgment and thought content normal.    ED Course  Procedures (including critical care time)  Labs  Reviewed  CBC WITH DIFFERENTIAL  LIPASE, BLOOD  COMPREHENSIVE METABOLIC PANEL  URINALYSIS, ROUTINE W REFLEX MICROSCOPIC   Results for orders placed during the hospital encounter of 07/25/12  CBC WITH DIFFERENTIAL      Result Value Range   WBC 4.3  4.0 -  10.5 K/uL   RBC 4.50  4.22 - 5.81 MIL/uL   Hemoglobin 13.7  13.0 - 17.0 g/dL   HCT 78.2 (*) 95.6 - 21.3 %   MCV 84.9  78.0 - 100.0 fL   MCH 30.4  26.0 - 34.0 pg   MCHC 35.9  30.0 - 36.0 g/dL   RDW 08.6 (*) 57.8 - 46.9 %   Platelets 188  150 - 400 K/uL   Neutrophils Relative % 52  43 - 77 %   Neutro Abs 2.2  1.7 - 7.7 K/uL   Lymphocytes Relative 36  12 - 46 %   Lymphs Abs 1.6  0.7 - 4.0 K/uL   Monocytes Relative 9  3 - 12 %   Monocytes Absolute 0.4  0.1 - 1.0 K/uL   Eosinophils Relative 2  0 - 5 %   Eosinophils Absolute 0.1  0.0 - 0.7 K/uL   Basophils Relative 1  0 - 1 %   Basophils Absolute 0.1  0.0 - 0.1 K/uL  LIPASE, BLOOD      Result Value Range   Lipase 15  11 - 59 U/L  COMPREHENSIVE METABOLIC PANEL      Result Value Range   Sodium 128 (*) 135 - 145 mEq/L   Potassium 4.6  3.5 - 5.1 mEq/L   Chloride 93 (*) 96 - 112 mEq/L   CO2 28  19 - 32 mEq/L   Glucose, Bld 358 (*) 70 - 99 mg/dL   BUN 10  6 - 23 mg/dL   Creatinine, Ser 6.29  0.50 - 1.35 mg/dL   Calcium 9.3  8.4 - 52.8 mg/dL   Total Protein 7.3  6.0 - 8.3 g/dL   Albumin 3.7  3.5 - 5.2 g/dL   AST 22  0 - 37 U/L   ALT 23  0 - 53 U/L   Alkaline Phosphatase 94  39 - 117 U/L   Total Bilirubin 0.2 (*) 0.3 - 1.2 mg/dL   GFR calc non Af Amer >90  >90 mL/min   GFR calc Af Amer >90  >90 mL/min  URINALYSIS, ROUTINE W REFLEX MICROSCOPIC      Result Value Range   Color, Urine YELLOW  YELLOW   APPearance CLEAR  CLEAR   Specific Gravity, Urine 1.029  1.005 - 1.030   pH 7.0  5.0 - 8.0   Glucose, UA >1000 (*) NEGATIVE mg/dL   Hgb urine dipstick NEGATIVE  NEGATIVE   Bilirubin Urine NEGATIVE  NEGATIVE   Ketones, ur NEGATIVE  NEGATIVE mg/dL   Protein, ur NEGATIVE  NEGATIVE  mg/dL   Urobilinogen, UA 0.2  0.0 - 1.0 mg/dL   Nitrite NEGATIVE  NEGATIVE   Leukocytes, UA NEGATIVE  NEGATIVE  URINE MICROSCOPIC-ADD ON      Result Value Range   Squamous Epithelial / LPF FEW (*) RARE   WBC, UA 0-2  <3 WBC/hpf  POCT I-STAT, CHEM 8      Result Value Range   Sodium 131 (*) 135 - 145 mEq/L   Potassium 6.0 (*) 3.5 - 5.1 mEq/L   Chloride 95 (*) 96 - 112 mEq/L   BUN 12  6 - 23 mg/dL   Creatinine, Ser 4.13  0.50 - 1.35 mg/dL   Glucose, Bld 244 (*) 70 - 99 mg/dL   Calcium, Ion 0.10  1.12 - 1.23 mmol/L   TCO2 31  0 - 100 mmol/L   Hemoglobin 15.0  13.0 - 17.0 g/dL   HCT 27.2  53.6 -  52.0 %   Ct Head Wo Contrast  07/20/2012   *RADIOLOGY REPORT*  Clinical Data: Severe headache  CT HEAD WITHOUT CONTRAST  Technique:  Contiguous axial images were obtained from the base of the skull through the vertex without contrast.  Comparison: 05/28/2012  Findings: No skull fracture is noted.  No intracranial hemorrhage, mass effect or midline shift.  No acute infarction.  No mass lesion is noted on this unenhanced scan. There is a nodular soft tissue prominence in the right upper posterior scalp.  Measures about 2.2 cm.  Clinical correlation is necessary to exclude neoplastic process.  Paranasal sinuses and mastoid air cells are unremarkable.  IMPRESSION: No acute intracranial abnormality.  Nodular soft tissue prominence in the right upper posterior scalp measures about 2.2 cm.  Clinical correlation is necessary to exclude a  neoplastic process.  Please see patient recent MRI 05/29/2012.   Original Report Authenticated By: Natasha Mead, M.D.   Ct Abdomen Pelvis W Contrast  06/29/2012   *RADIOLOGY REPORT*  Clinical Data: Abdominal pain.  History of pancreatitis.  CT ABDOMEN AND PELVIS WITH CONTRAST  Technique:  Multidetector CT imaging of the abdomen and pelvis was performed following the standard protocol during bolus administration of intravenous contrast.  Contrast: 80mL OMNIPAQUE IOHEXOL 300 MG/ML   SOLN  Comparison: 04/13/2012.  Findings: The lung bases are clear.  No pleural effusion.  The liver is unremarkable and stable.  No focal lesions or intrahepatic biliary dilatation.  The gallbladder is normal.  No common bile duct dilatation.  The pancreas demonstrates a stable small pseudocyst measuring a maximum of 2 cm in the body.  No findings for acute pancreatitis.  The spleen is normal in size.  No focal lesions.  The adrenal glands and kidneys are normal and stable.  The stomach, duodenum, small bowel and colon are unremarkable.  No inflammatory changes, mass lesions or obstruction.  No mesenteric or retroperitoneal masses or adenopathy.  Small scattered lymph nodes appears stable.  The aorta is normal in caliber.  Mild atherosclerotic changes.  The bladder appears normal.  The prostate gland is mildly enlarged but unchanged.  The seminal vesicles are normal.  No pelvic mass, adenopathy or free pelvic fluid collections.  No inguinal mass or hernia.  Since this CT scan of 10/23/2011 the patient has lost a significant amount of weight.  I do not see any significant intraperitoneal fat which was fairly prominent on the prior examination.  No significant bony findings.  IMPRESSION:  1.  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 2.  No findings for acute pancreatitis. 3.  No acute abdominal/pelvic findings, mass lesions or adenopathy. 4.  Significant weight loss since 10/23/2011.   Original Report Authenticated By: Rudie Meyer, M.D.     ED ECG REPORT  I personally interpreted this EKG   Date: 07/25/2012   Rate: 98  Rhythm: normal sinus rhythm  QRS Axis: normal  Intervals: normal  ST/T Wave abnormalities: normal  Conduction Disutrbances:none  Narrative Interpretation:   Old EKG Reviewed: unchanged   1. Nausea vomiting and diarrhea       MDM  Patient with n/v/d x 3 weeks.  Being treated at [facility] for cocaine and etoh detox.  No focal abdominal tenderness.  Patient seen by and discussed  with Dr. Adriana Simas, will give iv fluids, check labs, fluid challenge and reassess.  K is shown to be elevated.  Suspect hemolysis.  Will order CMP.    CMP shows normal potassium.  Patient is hyponatremic,  continue fluids.  2:21 PM Patient states that he is feeling a little better.  Will fluid challenge.  Discussed the patient with Dr. Adriana Simas, who tells me that it is probable that the patient has a viral gastroenteritis, but that his symptoms could also be coming from detox, or from his medications. This will be very difficult to tease apart. Dr. Adriana Simas recommends discharging the patient with Zofran or Phenergan, and allowing him to followup with the doctor at [facility].  Discussed the patient and labs in depth with Dr. Adriana Simas, who is in agreement with discharge and treatment plan.  The patient is stable and ready for discharge.  Abdomen remains benign.  Tolerating PO.  Will discharge patient back to [facility].  Will give the resource guide.        Roxy Horseman, PA-C 07/25/12 1520

## 2012-07-25 NOTE — ED Notes (Signed)
Spoke to [employer] from [facility] [employer]- updated on pt plan of care. sts will send for pt pick-up from waiting room.

## 2012-07-27 NOTE — ED Provider Notes (Signed)
Medical screening examination/treatment/procedure(s) were conducted as a shared visit with non-physician practitioner(s) and myself.  I personally evaluated the patient during the encounter.  Patient is not in DKA. He feels much better after 3 L of IV fluids.  No acute abdomen.  Donnetta Hutching, MD 07/27/12 1041

## 2012-08-03 ENCOUNTER — Emergency Department (HOSPITAL_BASED_OUTPATIENT_CLINIC_OR_DEPARTMENT_OTHER)
Admission: EM | Admit: 2012-08-03 | Discharge: 2012-08-03 | Disposition: A | Discharge status: Home / Self Care | Payer: Self-pay | Attending: Emergency Medicine | Admitting: Emergency Medicine

## 2012-08-03 ENCOUNTER — Encounter (HOSPITAL_BASED_OUTPATIENT_CLINIC_OR_DEPARTMENT_OTHER): Payer: Self-pay | Admitting: *Deleted

## 2012-08-03 DIAGNOSIS — Z87891 Personal history of nicotine dependence: Secondary | ICD-10-CM | POA: Insufficient documentation

## 2012-08-03 DIAGNOSIS — G40909 Epilepsy, unspecified, not intractable, without status epilepticus: Secondary | ICD-10-CM | POA: Insufficient documentation

## 2012-08-03 DIAGNOSIS — IMO0001 Reserved for inherently not codable concepts without codable children: Secondary | ICD-10-CM | POA: Insufficient documentation

## 2012-08-03 DIAGNOSIS — M791 Myalgia, unspecified site: Secondary | ICD-10-CM

## 2012-08-03 DIAGNOSIS — Z8669 Personal history of other diseases of the nervous system and sense organs: Secondary | ICD-10-CM | POA: Insufficient documentation

## 2012-08-03 DIAGNOSIS — R52 Pain, unspecified: Secondary | ICD-10-CM | POA: Insufficient documentation

## 2012-08-03 DIAGNOSIS — Z8719 Personal history of other diseases of the digestive system: Secondary | ICD-10-CM | POA: Insufficient documentation

## 2012-08-03 DIAGNOSIS — L299 Pruritus, unspecified: Secondary | ICD-10-CM | POA: Insufficient documentation

## 2012-08-03 DIAGNOSIS — Z8659 Personal history of other mental and behavioral disorders: Secondary | ICD-10-CM | POA: Insufficient documentation

## 2012-08-03 DIAGNOSIS — Z794 Long term (current) use of insulin: Secondary | ICD-10-CM | POA: Insufficient documentation

## 2012-08-03 DIAGNOSIS — Z79899 Other long term (current) drug therapy: Secondary | ICD-10-CM | POA: Insufficient documentation

## 2012-08-03 DIAGNOSIS — M545 Low back pain, unspecified: Secondary | ICD-10-CM | POA: Insufficient documentation

## 2012-08-03 DIAGNOSIS — E119 Type 2 diabetes mellitus without complications: Secondary | ICD-10-CM | POA: Insufficient documentation

## 2012-08-03 DIAGNOSIS — G8929 Other chronic pain: Secondary | ICD-10-CM | POA: Insufficient documentation

## 2012-08-03 DIAGNOSIS — I10 Essential (primary) hypertension: Secondary | ICD-10-CM | POA: Insufficient documentation

## 2012-08-03 DIAGNOSIS — M549 Dorsalgia, unspecified: Secondary | ICD-10-CM

## 2012-08-03 MED ORDER — DIPHENHYDRAMINE HCL 25 MG PO TABS: 25.0000 mg | ORAL_TABLET | Freq: Four times a day (QID) | ORAL | Status: DC

## 2012-08-03 MED ORDER — NAPROXEN 500 MG PO TABS: 500.0000 mg | ORAL_TABLET | Freq: Two times a day (BID) | ORAL | Status: DC

## 2012-08-03 NOTE — ED Provider Notes (Signed)
History    CSN: [id number] Arrival date & time 08/03/12  1002  First MD Initiated Contact with Patient 08/03/12 1026     Chief Complaint  Patient presents with  . Back Pain   (Consider location/radiation/quality/duration/timing/severity/associated sxs/prior Treatment) Patient is a 52 y.o. male presenting with back pain. The history is provided by the patient.  Back Pain Associated symptoms: no abdominal pain, no chest pain, no dysuria, no fever and no headaches    patient is from [facility]. Patient with a complaint of 2 weeks of itching and burning to the skin bodyaches and low back pain. The burning and bodyaches at times can be a 10 out of 10. Patient is a day [employer]. Inpatient for alcohol and drug rehabilitation. Patient's already been there 2-1/2 weeks. Patient has a history of diabetes and is already on Neurontin. Neurontin was started for neuropathies. No other complaints. No history of trauma. Past Medical History  Diagnosis Date  . Diabetes mellitus   . Hypertension   . Seizures   . Pancreatitis   . Alcohol abuse   . Chronic abdominal pain   . Alcoholism /alcohol abuse   . Neuromuscular disorder 07/02/2012    Diabetic neuropathy  . IONGEXBM(841.3)    Past Surgical History  Procedure Laterality Date  . Appendectomy     Family History  Problem Relation Age of Onset  . Hypertension     History  Substance Use Topics  . Smoking status: Former Smoker    Types: Cigarettes    Quit date: 12/21/1984  . Smokeless tobacco: Never Used  . Alcohol Use: Yes     Comment: heavy drinker: binge drinking per pt    Review of Systems  Constitutional: Negative for fever.  HENT: Negative for congestion.   Eyes: Negative for visual disturbance.  Respiratory: Negative for shortness of breath.   Cardiovascular: Negative for chest pain.  Gastrointestinal: Negative for abdominal pain.  Genitourinary: Negative for dysuria.  Musculoskeletal: Positive for myalgias and back pain. Negative  for joint swelling.  Skin: Negative for rash.  Neurological: Negative for headaches.  Hematological: Does not bruise/bleed easily.  Psychiatric/Behavioral: Negative for confusion.    Allergies  Morphine and related  Home Medications   Current Outpatient Rx  Name  Route  Sig  Dispense  Refill  . alum & mag hydroxide-simeth (MAALOX/MYLANTA) 200-200-20 MG/5ML suspension   Oral   Take 30 mLs by mouth every 4 (four) hours as needed for indigestion.         . diphenhydrAMINE (BENADRYL) 25 mg capsule   Oral   Take 25 mg by mouth every 6 (six) hours as needed.         . diphenhydrAMINE (BENADRYL) 25 MG tablet   Oral   Take 1 tablet (25 mg total) by mouth every 6 (six) hours.   20 tablet   0   . gabapentin (NEURONTIN) 300 MG capsule   Oral   Take 1 capsule (300 mg total) by mouth 3 (three) times daily.   90 capsule   0   . insulin aspart (NOVOLOG) 100 UNIT/ML injection   Subcutaneous   Inject 0-10 Units into the skin See admin instructions. 4 times daily before each meal, 140-199 - 2 units, 200-250 - 4 units, 251-299 - 6 units, 300-349 - 8 units, 350 or above 10 units         . insulin glargine (LANTUS) 100 UNIT/ML injection   Subcutaneous   Inject 0.3 mLs (30 Units total) into  the skin at bedtime. Was taking this as a home med.   10 mL   12   . lipase/protease/amylase (CREON-12/PANCREASE) 12000 UNITS CPEP   Oral   Take 2 capsules by mouth 3 (three) times daily.         Marland Kitchen lisinopril (PRINIVIL,ZESTRIL) 10 MG tablet   Oral   Take 1 tablet (10 mg total) by mouth daily. For hypertension.   30 tablet   0   . loperamide (IMODIUM) 2 MG capsule   Oral   Take 2 mg by mouth 4 (four) times daily as needed for diarrhea or loose stools.         . metFORMIN (GLUCOPHAGE) 500 MG tablet   Oral   Take 500 mg by mouth 2 (two) times daily with a meal.         . mirtazapine (REMERON) 15 MG tablet   Oral   Take 1 tablet (15 mg total) by mouth at bedtime.   30 tablet    0   . naproxen (NAPROSYN) 500 MG tablet   Oral   Take 1 tablet (500 mg total) by mouth 2 (two) times daily.   14 tablet   0   . naproxen sodium (ANAPROX) 220 MG tablet   Oral   Take 220 mg by mouth every 8 (eight) hours as needed.         . ondansetron (ZOFRAN) 4 MG tablet   Oral   Take 1 tablet (4 mg total) by mouth every 8 (eight) hours as needed for nausea.   10 tablet   0   . phenytoin (DILANTIN) 100 MG ER capsule   Oral   Take 2 capsules (200 mg total) by mouth at bedtime. For prevention of seizures.   60 capsule   6   . simvastatin (ZOCOR) 10 MG tablet   Oral   Take 1 tablet (10 mg total) by mouth daily at 6 PM.   30 tablet   0    BP 126/96  Pulse 126  Temp(Src) 98.3 F (36.8 C) (Oral)  Resp 20  SpO2 100% Physical Exam  Nursing note and vitals reviewed. Constitutional: He is oriented to person, place, and time. He appears well-developed and well-nourished. No distress.  HENT:  Head: Normocephalic and atraumatic.  Mouth/Throat: Oropharynx is clear and moist.  Eyes: Conjunctivae and EOM are normal. Pupils are equal, round, and reactive to light.  Neck: Normal range of motion.  Cardiovascular: Normal rate, regular rhythm and normal heart sounds.   Pulmonary/Chest: Effort normal and breath sounds normal. No respiratory distress.  Abdominal: Soft. Bowel sounds are normal. There is no tenderness.  Musculoskeletal: Normal range of motion. He exhibits no edema and no tenderness.  Low back without any tenderness or muscle spasm. No point tenderness to the back. No history of trauma.  Lymphadenopathy:    He has no cervical adenopathy.  Neurological: He is alert and oriented to person, place, and time. No cranial nerve deficit. He exhibits normal muscle tone. Coordination normal.  Skin: Skin is warm. No rash noted. No erythema.    ED Course  Procedures (including critical care time) Labs Reviewed - No data to display No results found. 1. Myalgia   2. Back  pain     MDM  Patient's resident day [facility]. 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 Patient currently not going through withdrawal symptoms. Is complaining of whole bodyaches occasional burning most the aches  are in the low part of the back. No neurological deficits. Patient's already on Neurontin. Patient has a history of diabetes. Patient symptoms not consistent with Dilantin toxicity. We'll give the patient a trial of Benadryl and Naprosyn for the pain and burning and itching sensation. Patient will continue all his current meds. Patient is in no acute distress here. Heart rate of 126 noted upon arrival patient's pulse in the room was in the 90s. Patient's symptoms could be neuropathy related but is oriented on Neurontin.  Shelda Jakes, MD 08/03/12 1128

## 2012-08-03 NOTE — ED Notes (Signed)
Pt amb to room 3 with steady gait using cane. Pt reports 1 week of itching and burning to the skin on his mid and low back. Pt states he gets some breif relief with cool shower, but then the itching and burning comes back after about . No rash noted.

## 2012-08-07 ENCOUNTER — Emergency Department (HOSPITAL_COMMUNITY)
Admission: EM | Admit: 2012-08-07 | Discharge: 2012-08-07 | Disposition: A | Discharge status: Home / Self Care | Payer: Self-pay | Attending: Emergency Medicine | Admitting: Emergency Medicine

## 2012-08-07 ENCOUNTER — Encounter (HOSPITAL_COMMUNITY): Payer: Self-pay | Admitting: Emergency Medicine

## 2012-08-07 DIAGNOSIS — K859 Acute pancreatitis without necrosis or infection, unspecified: Secondary | ICD-10-CM | POA: Insufficient documentation

## 2012-08-07 DIAGNOSIS — R52 Pain, unspecified: Secondary | ICD-10-CM | POA: Insufficient documentation

## 2012-08-07 DIAGNOSIS — Z87891 Personal history of nicotine dependence: Secondary | ICD-10-CM | POA: Insufficient documentation

## 2012-08-07 DIAGNOSIS — E1169 Type 2 diabetes mellitus with other specified complication: Secondary | ICD-10-CM | POA: Insufficient documentation

## 2012-08-07 DIAGNOSIS — Z79899 Other long term (current) drug therapy: Secondary | ICD-10-CM | POA: Insufficient documentation

## 2012-08-07 DIAGNOSIS — Z794 Long term (current) use of insulin: Secondary | ICD-10-CM | POA: Insufficient documentation

## 2012-08-07 DIAGNOSIS — G8929 Other chronic pain: Secondary | ICD-10-CM | POA: Insufficient documentation

## 2012-08-07 DIAGNOSIS — R739 Hyperglycemia, unspecified: Secondary | ICD-10-CM

## 2012-08-07 DIAGNOSIS — Z8669 Personal history of other diseases of the nervous system and sense organs: Secondary | ICD-10-CM | POA: Insufficient documentation

## 2012-08-07 DIAGNOSIS — R5381 Other malaise: Secondary | ICD-10-CM | POA: Insufficient documentation

## 2012-08-07 DIAGNOSIS — I10 Essential (primary) hypertension: Secondary | ICD-10-CM | POA: Insufficient documentation

## 2012-08-07 LAB — GLUCOSE, CAPILLARY: Glucose-Capillary: 272 mg/dL — ABNORMAL HIGH (ref 70–99)

## 2012-08-07 LAB — CBC WITH DIFFERENTIAL/PLATELET
Basophils Absolute: 0.1 10*3/uL (ref 0.0–0.1)
Basophils Relative: 1 % (ref 0–1)
Eosinophils Absolute: 0 10*3/uL (ref 0.0–0.7)
HCT: 41 % (ref 39.0–52.0)
MCH: 30.9 pg (ref 26.0–34.0)
MCHC: 37.8 g/dL — ABNORMAL HIGH (ref 30.0–36.0)
Monocytes Absolute: 0.3 10*3/uL (ref 0.1–1.0)
Monocytes Relative: 5 % (ref 3–12)
Neutro Abs: 4.1 10*3/uL (ref 1.7–7.7)
RDW: 11.6 % (ref 11.5–15.5)

## 2012-08-07 LAB — POCT I-STAT 3, VENOUS BLOOD GAS (G3P V)
TCO2: 30 mmol/L (ref 0–100)
pCO2, Ven: 46.9 mmHg (ref 45.0–50.0)
pH, Ven: 7.387 — ABNORMAL HIGH (ref 7.250–7.300)

## 2012-08-07 LAB — URINALYSIS, ROUTINE W REFLEX MICROSCOPIC
Bilirubin Urine: NEGATIVE
Glucose, UA: >1000 mg/dL — AB
Hgb urine dipstick: NEGATIVE
Ketones, ur: NEGATIVE mg/dL
Leukocytes, UA: NEGATIVE
Protein, ur: NEGATIVE mg/dL
pH: 5.5 (ref 5.0–8.0)

## 2012-08-07 LAB — COMPREHENSIVE METABOLIC PANEL
ALT: 30 U/L (ref 0–53)
AST: 18 U/L (ref 0–37)
Alkaline Phosphatase: 99 U/L (ref 39–117)
CO2: 24 mEq/L (ref 19–32)
GFR calc Af Amer: >90 mL/min (ref >90)
GFR calc non Af Amer: >90 mL/min (ref >90)
Glucose, Bld: 475 mg/dL — ABNORMAL HIGH (ref 70–99)
Potassium: 4.6 mEq/L (ref 3.5–5.1)
Sodium: 131 mEq/L — ABNORMAL LOW (ref 135–145)
Total Protein: 8 g/dL (ref 6.0–8.3)

## 2012-08-07 LAB — URINE MICROSCOPIC-ADD ON

## 2012-08-07 MED ORDER — INSULIN ASPART 100 UNIT/ML ~~LOC~~ SOLN
10.0000 [IU] | Freq: Once | SUBCUTANEOUS | Status: AC
  Administered 2012-08-07: 10 [IU] via INTRAVENOUS
  Filled 2012-08-07: qty 1

## 2012-08-07 MED ORDER — INSULIN ASPART 100 UNIT/ML ~~LOC~~ SOLN
6.0000 [IU] | Freq: Once | SUBCUTANEOUS | Status: AC
  Administered 2012-08-07: 6 [IU] via SUBCUTANEOUS
  Filled 2012-08-07: qty 1

## 2012-08-07 MED ORDER — INSULIN ASPART 100 UNIT/ML ~~LOC~~ SOLN
5.0000 [IU] | Freq: Once | SUBCUTANEOUS | Status: AC
  Administered 2012-08-07: 5 [IU] via INTRAVENOUS
  Filled 2012-08-07: qty 1

## 2012-08-07 MED ORDER — TRAMADOL HCL 50 MG PO TABS
100.0000 mg | ORAL_TABLET | Freq: Once | ORAL | Status: AC
  Administered 2012-08-07: 100 mg via ORAL
  Filled 2012-08-07: qty 2

## 2012-08-07 MED ORDER — SODIUM CHLORIDE 0.9 % IV BOLUS (SEPSIS)
1000.0000 mL | Freq: Once | INTRAVENOUS | Status: AC
  Administered 2012-08-07: 1000 mL via INTRAVENOUS

## 2012-08-07 MED ORDER — SODIUM CHLORIDE 0.9 % IV BOLUS (SEPSIS)
1000.0000 mL | Freq: Once | INTRAVENOUS | Status: AC
Start: 2012-08-07 — End: 2012-08-07
  Administered 2012-08-07: 1000 mL via INTRAVENOUS

## 2012-08-07 NOTE — ED Notes (Signed)
Pt sent here from [facility] for hyperglycemia and abd pain; pt sts hx of pancreatitis; pt is currently in [facility] for cocaine and ETOH; pt lethargic at present

## 2012-08-07 NOTE — ED Notes (Signed)
Pt's CBG is 296 mg/dl.RN notified.

## 2012-08-07 NOTE — ED Notes (Signed)
CBG was 481. Notified Nurse.

## 2012-08-07 NOTE — ED Notes (Signed)
[facility] notified that pt is ready for discharge and they need to send someone to pick him up.

## 2012-08-07 NOTE — ED Notes (Signed)
Pt's CBG is 476.RN Prentice Docker notified.

## 2012-08-07 NOTE — ED Provider Notes (Signed)
History    CSN: [id number] Arrival date & time 08/07/12  1108  First MD Initiated Contact with Patient 08/07/12 1122     Chief Complaint  Patient presents with  . Hyperglycemia   (Consider location/radiation/quality/duration/timing/severity/associated sxs/prior Treatment) HPI Comments: Pt state that he is having generalized pain:pt state that he has been at [facility] for drug and etoh rehab:pt states that he only had 2 of his doses yesterday because he ran out:pt denies any problems with his pancreatitis  Patient is a 52 y.o. male presenting with hyperglycemia. The history is provided by the patient. No language interpreter was used.  Hyperglycemia Blood sugar level PTA:  480 Onset quality:  Sudden Timing:  Constant Progression:  Unchanged Chronicity:  New Diabetes status:  Controlled with insulin Current diabetic therapy:  Novolog 4 times per day Context: noncompliance   Relieved by:  Nothing Ineffective treatments:  None tried Associated symptoms: malaise and weakness   Associated symptoms: no abdominal pain, no nausea and no shortness of breath    Past Medical History  Diagnosis Date  . Diabetes mellitus   . Hypertension   . Seizures   . Pancreatitis   . Alcohol abuse   . Chronic abdominal pain   . Alcoholism /alcohol abuse   . Neuromuscular disorder 07/02/2012    Diabetic neuropathy  . WUJWJXBJ(478.2)    Past Surgical History  Procedure Laterality Date  . Appendectomy     Family History  Problem Relation Age of Onset  . Hypertension     History  Substance Use Topics  . Smoking status: Former Smoker    Types: Cigarettes    Quit date: 12/21/1984  . Smokeless tobacco: Never Used  . Alcohol Use: Yes     Comment:  detox program 7/14 heavy drinker: binge drinking per pt    Review of Systems  Constitutional: Negative.   Respiratory: Negative for shortness of breath.   Cardiovascular: Negative.   Gastrointestinal: Negative for nausea and abdominal pain.     Allergies  Morphine and related  Home Medications   Current Outpatient Rx  Name  Route  Sig  Dispense  Refill  . alum & mag hydroxide-simeth (MAALOX/MYLANTA) 200-200-20 MG/5ML suspension   Oral   Take 30 mLs by mouth every 4 (four) hours as needed for indigestion.         . diphenhydrAMINE (BENADRYL) 25 MG tablet   Oral   Take 1 tablet (25 mg total) by mouth every 6 (six) hours.   20 tablet   0   . gabapentin (NEURONTIN) 300 MG capsule   Oral   Take 1 capsule (300 mg total) by mouth 3 (three) times daily.   90 capsule   0   . insulin aspart (NOVOLOG) 100 UNIT/ML injection   Subcutaneous   Inject 0-10 Units into the skin See admin instructions. 4 times daily before each meal, 140-199 - 2 units, 200-250 - 4 units, 251-299 - 6 units, 300-349 - 8 units, 350 or above 10 units         . insulin glargine (LANTUS) 100 UNIT/ML injection   Subcutaneous   Inject 0.3 mLs (30 Units total) into the skin at bedtime. Was taking this as a home med.   10 mL   12   . lipase/protease/amylase (CREON-12/PANCREASE) 12000 UNITS CPEP   Oral   Take 2 capsules by mouth 3 (three) times daily.         Marland Kitchen lisinopril (PRINIVIL,ZESTRIL) 10 MG tablet   Oral  Take 1 tablet (10 mg total) by mouth daily. For hypertension.   30 tablet   0   . loperamide (IMODIUM) 2 MG capsule   Oral   Take 2 mg by mouth 4 (four) times daily as needed for diarrhea or loose stools.         . metFORMIN (GLUCOPHAGE) 500 MG tablet   Oral   Take 500 mg by mouth 2 (two) times daily with a meal.         . mirtazapine (REMERON) 15 MG tablet   Oral   Take 1 tablet (15 mg total) by mouth at bedtime.   30 tablet   0   . naproxen (NAPROSYN) 500 MG tablet   Oral   Take 1 tablet (500 mg total) by mouth 2 (two) times daily.   14 tablet   0   . ondansetron (ZOFRAN) 4 MG tablet   Oral   Take 1 tablet (4 mg total) by mouth every 8 (eight) hours as needed for nausea.   10 tablet   0   . phenytoin  (DILANTIN) 100 MG ER capsule   Oral   Take 2 capsules (200 mg total) by mouth at bedtime. For prevention of seizures.   60 capsule   6   . simvastatin (ZOCOR) 10 MG tablet   Oral   Take 1 tablet (10 mg total) by mouth daily at 6 PM.   30 tablet   0    BP 108/81  Pulse 121  Temp(Src) 98.5 F (36.9 C) (Oral)  Resp 16  SpO2 98% Physical Exam  Nursing note and vitals reviewed. Constitutional: He is oriented to person, place, and time. He appears well-developed and well-nourished.  HENT:  Head: Normocephalic and atraumatic.  Eyes: Conjunctivae and EOM are normal. Pupils are equal, round, and reactive to light.  Neck: Neck supple.  Cardiovascular: Normal rate and regular rhythm.   Pulmonary/Chest: Effort normal and breath sounds normal.  Abdominal: Soft. Bowel sounds are normal.  Musculoskeletal: Normal range of motion.  Neurological: He is alert and oriented to person, place, and time.  Skin: Skin is warm and dry.  Psychiatric: He has a normal mood and affect.    ED Course  Procedures (including critical care time) Labs Reviewed  GLUCOSE, CAPILLARY - Abnormal; Notable for the following:    Glucose-Capillary 481 (*)    All other components within normal limits  COMPREHENSIVE METABOLIC PANEL - Abnormal; Notable for the following:    Sodium 131 (*)    Chloride 93 (*)    Glucose, Bld 475 (*)    All other components within normal limits  CBC WITH DIFFERENTIAL - Abnormal; Notable for the following:    MCHC 37.8 (*)    All other components within normal limits  URINALYSIS, ROUTINE W REFLEX MICROSCOPIC - Abnormal; Notable for the following:    Specific Gravity, Urine 1.039 (*)    Glucose, UA >1000 (*)    All other components within normal limits  GLUCOSE, CAPILLARY - Abnormal; Notable for the following:    Glucose-Capillary 476 (*)    All other components within normal limits  GLUCOSE, CAPILLARY - Abnormal; Notable for the following:    Glucose-Capillary 296 (*)    All  other components within normal limits  POCT I-STAT 3, BLOOD GAS (G3P V) - Abnormal; Notable for the following:    pH, Ven 7.387 (*)    pO2, Ven 23.0 (*)    Bicarbonate 28.1 (*)    All other  components within normal limits  LIPASE, BLOOD  URINE MICROSCOPIC-ADD ON  BLOOD GAS, VENOUS  PHENYTOIN LEVEL, FREE   No results found. 1. Hyperglycemia     MDM  3:03 PM In order for pt to go back to [facility] they would like the bs in the low 200 6:14 PM Pt is feeling better at this time:think okay for pt to go back to [facility]:pt not in [facility]  Teressa Lower, NP 08/07/12 1815

## 2012-08-07 NOTE — ED Provider Notes (Signed)
I saw and evaluated the patient, reviewed the resident's note and I agree with the findings and plan.   .Face to face Exam:  General:  Awake HEENT:  Atraumatic Resp:  Normal effort Abd:  Nondistended Neuro:No focal weakness Lymph: No adenopathy   Nelia Shi, MD 08/07/12 1844

## 2012-08-07 NOTE — ED Notes (Signed)
Pt is an inpatient at a [facility] for etoh and coc. Nadene Rubins at [facility] is the POC when pt is d/c. [phone] is the number to the nurse's station at [facility] - they will send someone to pick up the pt. Alt # [phone] is the main phone number to [facility] in case no one answers at the nurse's station.

## 2012-08-10 ENCOUNTER — Encounter (HOSPITAL_COMMUNITY): Payer: Self-pay

## 2012-08-10 ENCOUNTER — Emergency Department (HOSPITAL_COMMUNITY)
Admission: EM | Admit: 2012-08-10 | Discharge: 2012-08-10 | Disposition: A | Discharge status: Home / Self Care | Payer: Self-pay | Attending: Emergency Medicine | Admitting: Emergency Medicine

## 2012-08-10 DIAGNOSIS — G8929 Other chronic pain: Secondary | ICD-10-CM | POA: Insufficient documentation

## 2012-08-10 DIAGNOSIS — G709 Myoneural disorder, unspecified: Secondary | ICD-10-CM | POA: Insufficient documentation

## 2012-08-10 DIAGNOSIS — Z794 Long term (current) use of insulin: Secondary | ICD-10-CM | POA: Insufficient documentation

## 2012-08-10 DIAGNOSIS — E1149 Type 2 diabetes mellitus with other diabetic neurological complication: Secondary | ICD-10-CM | POA: Insufficient documentation

## 2012-08-10 DIAGNOSIS — I10 Essential (primary) hypertension: Secondary | ICD-10-CM | POA: Insufficient documentation

## 2012-08-10 DIAGNOSIS — Z79899 Other long term (current) drug therapy: Secondary | ICD-10-CM | POA: Insufficient documentation

## 2012-08-10 DIAGNOSIS — G40909 Epilepsy, unspecified, not intractable, without status epilepticus: Secondary | ICD-10-CM | POA: Insufficient documentation

## 2012-08-10 DIAGNOSIS — F329 Major depressive disorder, single episode, unspecified: Secondary | ICD-10-CM | POA: Insufficient documentation

## 2012-08-10 DIAGNOSIS — Z8679 Personal history of other diseases of the circulatory system: Secondary | ICD-10-CM | POA: Insufficient documentation

## 2012-08-10 DIAGNOSIS — IMO0002 Reserved for concepts with insufficient information to code with codable children: Secondary | ICD-10-CM | POA: Insufficient documentation

## 2012-08-10 DIAGNOSIS — E1169 Type 2 diabetes mellitus with other specified complication: Secondary | ICD-10-CM | POA: Insufficient documentation

## 2012-08-10 DIAGNOSIS — F1020 Alcohol dependence, uncomplicated: Secondary | ICD-10-CM | POA: Insufficient documentation

## 2012-08-10 DIAGNOSIS — Z8719 Personal history of other diseases of the digestive system: Secondary | ICD-10-CM | POA: Insufficient documentation

## 2012-08-10 DIAGNOSIS — F3289 Other specified depressive episodes: Secondary | ICD-10-CM | POA: Insufficient documentation

## 2012-08-10 DIAGNOSIS — R112 Nausea with vomiting, unspecified: Secondary | ICD-10-CM | POA: Insufficient documentation

## 2012-08-10 DIAGNOSIS — R197 Diarrhea, unspecified: Secondary | ICD-10-CM | POA: Insufficient documentation

## 2012-08-10 DIAGNOSIS — Z87891 Personal history of nicotine dependence: Secondary | ICD-10-CM | POA: Insufficient documentation

## 2012-08-10 DIAGNOSIS — M129 Arthropathy, unspecified: Secondary | ICD-10-CM | POA: Insufficient documentation

## 2012-08-10 DIAGNOSIS — R739 Hyperglycemia, unspecified: Secondary | ICD-10-CM

## 2012-08-10 DIAGNOSIS — R109 Unspecified abdominal pain: Secondary | ICD-10-CM

## 2012-08-10 LAB — URINALYSIS, ROUTINE W REFLEX MICROSCOPIC
Glucose, UA: >1000 mg/dL — AB
Ketones, ur: NEGATIVE mg/dL
Leukocytes, UA: NEGATIVE
Protein, ur: NEGATIVE mg/dL
Urobilinogen, UA: 0.2 mg/dL (ref 0.0–1.0)

## 2012-08-10 LAB — LIPASE, BLOOD: Lipase: 12 U/L (ref 11–59)

## 2012-08-10 LAB — CBC
HCT: 33.8 % — ABNORMAL LOW (ref 39.0–52.0)
MCHC: 37.6 g/dL — ABNORMAL HIGH (ref 30.0–36.0)
MCV: 80.9 fL (ref 78.0–100.0)
Platelets: 212 10*3/uL (ref 150–400)
RDW: 11.3 % — ABNORMAL LOW (ref 11.5–15.5)
WBC: 6.2 10*3/uL (ref 4.0–10.5)

## 2012-08-10 LAB — COMPREHENSIVE METABOLIC PANEL
AST: 18 U/L (ref 0–37)
Albumin: 3.5 g/dL (ref 3.5–5.2)
BUN: 14 mg/dL (ref 6–23)
Creatinine, Ser: 0.67 mg/dL (ref 0.50–1.35)
Potassium: 4.1 mEq/L (ref 3.5–5.1)
Total Protein: 7 g/dL (ref 6.0–8.3)

## 2012-08-10 LAB — GLUCOSE, CAPILLARY
Glucose-Capillary: 527 mg/dL — ABNORMAL HIGH (ref 70–99)
Glucose-Capillary: 375 mg/dL — ABNORMAL HIGH (ref 70–99)

## 2012-08-10 LAB — URINE MICROSCOPIC-ADD ON: Urine-Other: NONE SEEN

## 2012-08-10 LAB — CG4 I-STAT (LACTIC ACID): Lactic Acid, Venous: 1.22 mmol/L (ref 0.5–2.2)

## 2012-08-10 MED ORDER — INSULIN ASPART 100 UNIT/ML ~~LOC~~ SOLN
5.0000 [IU] | Freq: Once | SUBCUTANEOUS | Status: AC
  Administered 2012-08-10: 5 [IU] via SUBCUTANEOUS
  Filled 2012-08-10: qty 1

## 2012-08-10 MED ORDER — ONDANSETRON HCL 4 MG/2ML IJ SOLN
4.0000 mg | Freq: Once | INTRAMUSCULAR | Status: AC
  Administered 2012-08-10: 4 mg via INTRAVENOUS
  Filled 2012-08-10: qty 2

## 2012-08-10 MED ORDER — SODIUM CHLORIDE 0.9 % IV BOLUS (SEPSIS)
1000.0000 mL | Freq: Once | INTRAVENOUS | Status: AC
  Administered 2012-08-10: 1000 mL via INTRAVENOUS

## 2012-08-10 MED ORDER — HYDROMORPHONE HCL PF 1 MG/ML IJ SOLN
1.0000 mg | Freq: Once | INTRAMUSCULAR | Status: AC
  Administered 2012-08-10: 1 mg via INTRAVENOUS
  Filled 2012-08-10: qty 1

## 2012-08-10 NOTE — ED Provider Notes (Signed)
History    CSN: [id number] Arrival date & time 08/10/12  0039  First MD Initiated Contact with Patient 08/10/12 0215     Chief Complaint  Patient presents with  . Abdominal Pain  . Hyperglycemia    Patient is a 52 y.o. male presenting with abdominal pain. The history is provided by the patient.  Abdominal Pain This is a chronic problem. The current episode started yesterday. The problem occurs constantly. The problem has been gradually worsening. Associated symptoms include abdominal pain. Pertinent negatives include no chest pain. Nothing aggravates the symptoms. Nothing relieves the symptoms. He has tried rest for the symptoms. The treatment provided no relief.  pt reports after eating chinese food yesterday for lunch he developed pain and nonbloody vomit/diarrhea with multiple episodes of both Denies ETOH use  Past Medical History  Diagnosis Date  . Diabetes mellitus   . Hypertension   . Seizures   . Pancreatitis   . Alcohol abuse   . Chronic abdominal pain   . Alcoholism /alcohol abuse   . Neuromuscular disorder 07/02/2012    Diabetic neuropathy  . WUJWJXBJ(478.2)    Past Surgical History  Procedure Laterality Date  . Appendectomy     Family History  Problem Relation Age of Onset  . Hypertension     History  Substance Use Topics  . Smoking status: Former Smoker    Types: Cigarettes    Quit date: 12/21/1984  . Smokeless tobacco: Never Used  . Alcohol Use: Yes     Comment:  detox program 7/14 heavy drinker: binge drinking per pt    Review of Systems  Constitutional: Negative for fever.  Cardiovascular: Negative for chest pain.  Gastrointestinal: Positive for nausea, vomiting, abdominal pain and diarrhea. Negative for blood in stool.  Neurological: Negative for weakness.  All other systems reviewed and are negative.    Allergies  Morphine and related  Home Medications   Current Outpatient Rx  Name  Route  Sig  Dispense  Refill  . diphenhydrAMINE  (BENADRYL) 25 MG tablet   Oral   Take 1 tablet (25 mg total) by mouth every 6 (six) hours.   20 tablet   0   . gabapentin (NEURONTIN) 300 MG capsule   Oral   Take 1 capsule (300 mg total) by mouth 3 (three) times daily.   90 capsule   0   . insulin aspart (NOVOLOG) 100 UNIT/ML injection   Subcutaneous   Inject 0-10 Units into the skin See admin instructions. 4 times daily before each meal, 140-199 - 2 units, 200-250 - 4 units, 251-299 - 6 units, 300-349 - 8 units, 350 or above 10 units         . insulin glargine (LANTUS) 100 UNIT/ML injection   Subcutaneous   Inject 0.3 mLs (30 Units total) into the skin at bedtime. Was taking this as a home med.   10 mL   12   . lipase/protease/amylase (CREON-12/PANCREASE) 12000 UNITS CPEP   Oral   Take 2 capsules by mouth 3 (three) times daily.         Marland Kitchen lisinopril (PRINIVIL,ZESTRIL) 10 MG tablet   Oral   Take 1 tablet (10 mg total) by mouth daily. For hypertension.   30 tablet   0   . metFORMIN (GLUCOPHAGE) 500 MG tablet   Oral   Take 500 mg by mouth 2 (two) times daily with a meal.         . mirtazapine (REMERON) 15 MG tablet  Oral   Take 1 tablet (15 mg total) by mouth at bedtime.   30 tablet   0   . naproxen (NAPROSYN) 500 MG tablet   Oral   Take 1 tablet (500 mg total) by mouth 2 (two) times daily.   14 tablet   0   . ondansetron (ZOFRAN) 4 MG tablet   Oral   Take 1 tablet (4 mg total) by mouth every 8 (eight) hours as needed for nausea.   10 tablet   0   . phenytoin (DILANTIN) 100 MG ER capsule   Oral   Take 2 capsules (200 mg total) by mouth at bedtime. For prevention of seizures.   60 capsule   6   . simvastatin (ZOCOR) 10 MG tablet   Oral   Take 1 tablet (10 mg total) by mouth daily at 6 PM.   30 tablet   0   . alum & mag hydroxide-simeth (MAALOX/MYLANTA) 200-200-20 MG/5ML suspension   Oral   Take 30 mLs by mouth every 4 (four) hours as needed for indigestion.         Marland Kitchen loperamide (IMODIUM) 2  MG capsule   Oral   Take 2 mg by mouth 4 (four) times daily as needed for diarrhea or loose stools.          BP 153/107  Pulse 112  Temp(Src) 99 F (37.2 C) (Oral)  Resp 18  SpO2 100% Physical Exam CONSTITUTIONAL: Well developed/well nourished HEAD: Normocephalic/atraumatic EYES: EOMI/PERRL ENMT: Mucous membranes moist NECK: supple no meningeal signs SPINE:entire spine nontender CV: S1/S2 noted, no murmurs/rubs/gallops noted LUNGS: Lungs are clear to auscultation bilaterally, no apparent distress ABDOMEN: soft, moderate epigastric tenderness, no rebound or guarding GU:no cva tenderness NEURO: Pt is awake/alert, moves all extremitiesx4 EXTREMITIES: pulses normal, full ROM SKIN: warm, color normal PSYCH: no abnormalities of mood noted  ED Course  Procedures (including critical care time) Labs Reviewed  URINALYSIS, ROUTINE W REFLEX MICROSCOPIC - Abnormal; Notable for the following:    Specific Gravity, Urine 1.041 (*)    Glucose, UA >1000 (*)    All other components within normal limits  GLUCOSE, CAPILLARY - Abnormal; Notable for the following:    Glucose-Capillary 527 (*)    All other components within normal limits  URINE MICROSCOPIC-ADD ON  CBC  COMPREHENSIVE METABOLIC PANEL  LIPASE, BLOOD   2:43 AM Pt with multiple visits for abdominal pain.  He has h/o pancreatitis.  Will treat pain, check labs and reassess He is also hyperglycemic but does not appear ketotic  6:19 AM Pt improved, sleeping at this time, has been observed for several hours and is improved Stable for d/c   MDM  Nursing notes including past medical history and social history reviewed and considered in documentation Labs/vital reviewed and considered     Date: 08/10/2012  Rate: 109  Rhythm: normal sinus rhythm  QRS Axis: right  Intervals: normal  ST/T Wave abnormalities: normal  Conduction Disutrbances:none  Narrative Interpretation:   Old EKG Reviewed: unchanged     Joya Gaskins, MD 08/10/12 [phone]

## 2012-08-10 NOTE — ED Notes (Addendum)
EKG old and new given to EDP, Bebe Shaggy, MD.

## 2012-08-10 NOTE — ED Notes (Signed)
Pt presents via EMS with c/o abdominal pain and hyperglycemia. Per EMS pt says he ate some chinese food for lunch that did not agree with him and flared up his pancreatitis. Pt told EMS he has vomited three times since then and had one loose stool. Pt told EMS that he gets dizzy and lightheaded when he stands. CBG reading high on machine with EMS. 18 g left anterior forearm placed by EMS, 4 mg of zofran given en route. Sinus tachy with EMS.

## 2012-08-10 NOTE — ED Notes (Signed)
[id number]<AV> Expected date:<BR> Expected time:<BR> Means of arrival:<BR> Comments:<BR> EMS, abd pain, hyperglycemia

## 2012-08-10 NOTE — ED Notes (Signed)
Dr. Bebe Shaggy made aware of pt's blood sugar.

## 2012-08-12 LAB — PHENYTOIN LEVEL, FREE AND TOTAL
Phenytoin, Free: <0.5 mg/L (ref 1.0–2.0)
Phenytoin, Total: <2.5 mg/L (ref 10.0–20.0)

## 2012-08-13 ENCOUNTER — Inpatient Hospital Stay (HOSPITAL_COMMUNITY)
Admission: EM | Admit: 2012-08-13 | Discharge: 2012-08-15 | DRG: 638 | Disposition: A | Discharge status: Home / Self Care | Payer: MEDICAID | Attending: Family Medicine | Admitting: Family Medicine

## 2012-08-13 ENCOUNTER — Other Ambulatory Visit: Payer: Self-pay

## 2012-08-13 ENCOUNTER — Encounter (HOSPITAL_COMMUNITY): Payer: Self-pay | Admitting: *Deleted

## 2012-08-13 DIAGNOSIS — R109 Unspecified abdominal pain: Secondary | ICD-10-CM | POA: Diagnosis present

## 2012-08-13 DIAGNOSIS — F3289 Other specified depressive episodes: Secondary | ICD-10-CM | POA: Diagnosis present

## 2012-08-13 DIAGNOSIS — Z9119 Patient's noncompliance with other medical treatment and regimen: Secondary | ICD-10-CM

## 2012-08-13 DIAGNOSIS — E860 Dehydration: Secondary | ICD-10-CM

## 2012-08-13 DIAGNOSIS — R112 Nausea with vomiting, unspecified: Secondary | ICD-10-CM | POA: Diagnosis present

## 2012-08-13 DIAGNOSIS — I10 Essential (primary) hypertension: Secondary | ICD-10-CM | POA: Diagnosis present

## 2012-08-13 DIAGNOSIS — E782 Mixed hyperlipidemia: Secondary | ICD-10-CM | POA: Diagnosis present

## 2012-08-13 DIAGNOSIS — M216X9 Other acquired deformities of unspecified foot: Secondary | ICD-10-CM | POA: Diagnosis present

## 2012-08-13 DIAGNOSIS — G4700 Insomnia, unspecified: Secondary | ICD-10-CM | POA: Diagnosis present

## 2012-08-13 DIAGNOSIS — E876 Hypokalemia: Secondary | ICD-10-CM

## 2012-08-13 DIAGNOSIS — K861 Other chronic pancreatitis: Secondary | ICD-10-CM

## 2012-08-13 DIAGNOSIS — E43 Unspecified severe protein-calorie malnutrition: Secondary | ICD-10-CM | POA: Insufficient documentation

## 2012-08-13 DIAGNOSIS — F329 Major depressive disorder, single episode, unspecified: Secondary | ICD-10-CM | POA: Diagnosis present

## 2012-08-13 DIAGNOSIS — G40909 Epilepsy, unspecified, not intractable, without status epilepticus: Secondary | ICD-10-CM | POA: Diagnosis present

## 2012-08-13 DIAGNOSIS — E1310 Other specified diabetes mellitus with ketoacidosis without coma: Principal | ICD-10-CM | POA: Diagnosis present

## 2012-08-13 DIAGNOSIS — R569 Unspecified convulsions: Secondary | ICD-10-CM | POA: Diagnosis present

## 2012-08-13 DIAGNOSIS — F1020 Alcohol dependence, uncomplicated: Secondary | ICD-10-CM | POA: Diagnosis present

## 2012-08-13 DIAGNOSIS — F1410 Cocaine abuse, uncomplicated: Secondary | ICD-10-CM | POA: Diagnosis present

## 2012-08-13 DIAGNOSIS — R197 Diarrhea, unspecified: Secondary | ICD-10-CM

## 2012-08-13 DIAGNOSIS — E119 Type 2 diabetes mellitus without complications: Secondary | ICD-10-CM | POA: Diagnosis present

## 2012-08-13 DIAGNOSIS — F1210 Cannabis abuse, uncomplicated: Secondary | ICD-10-CM | POA: Diagnosis present

## 2012-08-13 DIAGNOSIS — Z91199 Patient's noncompliance with other medical treatment and regimen due to unspecified reason: Secondary | ICD-10-CM

## 2012-08-13 DIAGNOSIS — Z794 Long term (current) use of insulin: Secondary | ICD-10-CM

## 2012-08-13 DIAGNOSIS — E1110 Type 2 diabetes mellitus with ketoacidosis without coma: Secondary | ICD-10-CM | POA: Diagnosis present

## 2012-08-13 DIAGNOSIS — G8929 Other chronic pain: Secondary | ICD-10-CM | POA: Diagnosis present

## 2012-08-13 DIAGNOSIS — Z87891 Personal history of nicotine dependence: Secondary | ICD-10-CM

## 2012-08-13 DIAGNOSIS — E1142 Type 2 diabetes mellitus with diabetic polyneuropathy: Secondary | ICD-10-CM | POA: Diagnosis present

## 2012-08-13 DIAGNOSIS — E1100 Type 2 diabetes mellitus with hyperosmolarity without nonketotic hyperglycemic-hyperosmolar coma (NKHHC): Secondary | ICD-10-CM

## 2012-08-13 HISTORY — DX: Unspecified osteoarthritis, unspecified site: M19.90

## 2012-08-13 HISTORY — DX: Pure hypercholesterolemia, unspecified: E78.00

## 2012-08-13 HISTORY — DX: Reserved for inherently not codable concepts without codable children: IMO0001

## 2012-08-13 HISTORY — DX: Depression, unspecified: F32.A

## 2012-08-13 HISTORY — DX: Major depressive disorder, single episode, unspecified: F32.9

## 2012-08-13 HISTORY — DX: Type 2 diabetes mellitus without complications: E11.9

## 2012-08-13 LAB — URINALYSIS, ROUTINE W REFLEX MICROSCOPIC
Glucose, UA: >1000 mg/dL — AB
Hgb urine dipstick: NEGATIVE
Leukocytes, UA: NEGATIVE
Protein, ur: NEGATIVE mg/dL
Specific Gravity, Urine: 1.045 — ABNORMAL HIGH (ref 1.005–1.030)
pH: 5.5 (ref 5.0–8.0)

## 2012-08-13 LAB — COMPREHENSIVE METABOLIC PANEL
AST: 37 U/L (ref 0–37)
Albumin: 3.9 g/dL (ref 3.5–5.2)
Calcium: 9 mg/dL (ref 8.4–10.5)
Creatinine, Ser: 0.78 mg/dL (ref 0.50–1.35)
GFR calc non Af Amer: >90 mL/min (ref >90)

## 2012-08-13 LAB — HEMOGLOBIN A1C
Hgb A1c MFr Bld: 11.2 % — ABNORMAL HIGH (ref <5.7)
Mean Plasma Glucose: 275 mg/dL — ABNORMAL HIGH (ref <117)

## 2012-08-13 LAB — BASIC METABOLIC PANEL
BUN: 8 mg/dL (ref 6–23)
CO2: 19 mEq/L (ref 19–32)
Calcium: 8.5 mg/dL (ref 8.4–10.5)
Chloride: 100 mEq/L (ref 96–112)
Chloride: 100 mEq/L (ref 96–112)
GFR calc Af Amer: >90 mL/min (ref >90)
GFR calc Af Amer: >90 mL/min (ref >90)
GFR calc non Af Amer: >90 mL/min (ref >90)
Glucose, Bld: 216 mg/dL — ABNORMAL HIGH (ref 70–99)
Potassium: 4.1 mEq/L (ref 3.5–5.1)
Potassium: 3.8 mEq/L (ref 3.5–5.1)
Potassium: 3.3 mEq/L — ABNORMAL LOW (ref 3.5–5.1)
Sodium: 130 mEq/L — ABNORMAL LOW (ref 135–145)
Sodium: 134 mEq/L — ABNORMAL LOW (ref 135–145)

## 2012-08-13 LAB — CBC WITH DIFFERENTIAL/PLATELET
Eosinophils Absolute: 0 10*3/uL (ref 0.0–0.7)
Hemoglobin: 14.1 g/dL (ref 13.0–17.0)
Lymphocytes Relative: 31 % (ref 12–46)
Lymphs Abs: 1.4 10*3/uL (ref 0.7–4.0)
MCH: 30 pg (ref 26.0–34.0)
Monocytes Relative: 6 % (ref 3–12)
Neutrophils Relative %: 62 % (ref 43–77)
RBC: 4.7 MIL/uL (ref 4.22–5.81)
WBC: 4.3 10*3/uL (ref 4.0–10.5)

## 2012-08-13 LAB — CBC
HCT: 34.8 % — ABNORMAL LOW (ref 39.0–52.0)
Hemoglobin: 12.8 g/dL — ABNORMAL LOW (ref 13.0–17.0)
WBC: 4.2 10*3/uL (ref 4.0–10.5)

## 2012-08-13 LAB — POCT I-STAT TROPONIN I: Troponin i, poc: 0.01 ng/mL (ref 0.00–0.08)

## 2012-08-13 LAB — LIPASE, BLOOD: Lipase: 11 U/L (ref 11–59)

## 2012-08-13 LAB — GLUCOSE, CAPILLARY: Glucose-Capillary: 296 mg/dL — ABNORMAL HIGH (ref 70–99)

## 2012-08-13 LAB — URINE MICROSCOPIC-ADD ON

## 2012-08-13 MED ORDER — SODIUM CHLORIDE 0.9 % IV BOLUS (SEPSIS)
1000.0000 mL | Freq: Once | INTRAVENOUS | Status: AC
  Administered 2012-08-13: 1000 mL via INTRAVENOUS

## 2012-08-13 MED ORDER — DEXTROSE 50 % IV SOLN: 25.0000 mL | INTRAVENOUS | Status: DC | PRN

## 2012-08-13 MED ORDER — PHENYTOIN SODIUM EXTENDED 100 MG PO CAPS: 200.0000 mg | ORAL_CAPSULE | Freq: Every day | ORAL | Status: DC

## 2012-08-13 MED ORDER — INSULIN REGULAR BOLUS VIA INFUSION
0.0000 [IU] | Freq: Three times a day (TID) | INTRAVENOUS | Status: DC
  Filled 2012-08-13: qty 10

## 2012-08-13 MED ORDER — SODIUM CHLORIDE 0.9 % IV SOLN
INTRAVENOUS | Status: DC
  Administered 2012-08-13: 3.3 [IU]/h via INTRAVENOUS
  Filled 2012-08-13: qty 1

## 2012-08-13 MED ORDER — ALUM & MAG HYDROXIDE-SIMETH 200-200-20 MG/5ML PO SUSP
30.0000 mL | ORAL | Status: DC | PRN
  Filled 2012-08-13: qty 30

## 2012-08-13 MED ORDER — SODIUM CHLORIDE 0.9 % IJ SOLN: 3.0000 mL | Freq: Two times a day (BID) | INTRAMUSCULAR | Status: DC

## 2012-08-13 MED ORDER — SODIUM CHLORIDE 0.9 % IV SOLN
INTRAVENOUS | Status: DC
  Administered 2012-08-13: 150 mL/h via INTRAVENOUS

## 2012-08-13 MED ORDER — ACETAMINOPHEN 325 MG PO TABS: 650.0000 mg | ORAL_TABLET | Freq: Four times a day (QID) | ORAL | Status: DC | PRN

## 2012-08-13 MED ORDER — FENTANYL CITRATE 0.05 MG/ML IJ SOLN
50.0000 ug | Freq: Once | INTRAMUSCULAR | Status: AC
  Administered 2012-08-13: 50 ug via INTRAVENOUS
  Filled 2012-08-13: qty 2

## 2012-08-13 MED ORDER — ACETAMINOPHEN 650 MG RE SUPP: 650.0000 mg | Freq: Four times a day (QID) | RECTAL | Status: DC | PRN

## 2012-08-13 MED ORDER — ONDANSETRON HCL 4 MG PO TABS: 4.0000 mg | ORAL_TABLET | Freq: Three times a day (TID) | ORAL | Status: DC | PRN

## 2012-08-13 MED ORDER — SODIUM CHLORIDE 0.9 % IV SOLN: INTRAVENOUS | Status: DC

## 2012-08-13 MED ORDER — SODIUM CHLORIDE 0.9 % IV SOLN
INTRAVENOUS | Status: DC
  Administered 2012-08-13: 4.1 [IU]/h via INTRAVENOUS
  Administered 2012-08-13: 2.4 [IU]/h via INTRAVENOUS
  Filled 2012-08-13: qty 1

## 2012-08-13 MED ORDER — POTASSIUM CHLORIDE 10 MEQ/100ML IV SOLN
10.0000 meq | INTRAVENOUS | Status: AC
Start: 2012-08-13 — End: 2012-08-13
  Administered 2012-08-13 (×2): 10 meq via INTRAVENOUS
  Filled 2012-08-13 (×2): qty 100

## 2012-08-13 MED ORDER — DEXTROSE-NACL 5-0.45 % IV SOLN: INTRAVENOUS | Status: DC

## 2012-08-13 MED ORDER — ASPIRIN EC 81 MG PO TBEC
81.0000 mg | DELAYED_RELEASE_TABLET | Freq: Every day | ORAL | Status: DC
  Administered 2012-08-13 – 2012-08-15 (×3): 81 mg via ORAL
  Filled 2012-08-13 (×3): qty 1

## 2012-08-13 MED ORDER — PHENYTOIN SODIUM EXTENDED 100 MG PO CAPS
400.0000 mg | ORAL_CAPSULE | Freq: Every day | ORAL | Status: DC
  Administered 2012-08-13 – 2012-08-14 (×2): 400 mg via ORAL
  Filled 2012-08-13 (×3): qty 4

## 2012-08-13 MED ORDER — LISINOPRIL 10 MG PO TABS
10.0000 mg | ORAL_TABLET | Freq: Every day | ORAL | Status: DC
  Administered 2012-08-13 – 2012-08-15 (×3): 10 mg via ORAL
  Filled 2012-08-13 (×3): qty 1

## 2012-08-13 MED ORDER — SIMVASTATIN 40 MG PO TABS
40.0000 mg | ORAL_TABLET | Freq: Every day | ORAL | Status: DC
  Administered 2012-08-13 – 2012-08-14 (×2): 40 mg via ORAL
  Filled 2012-08-13 (×3): qty 1

## 2012-08-13 MED ORDER — TRAZODONE HCL 100 MG PO TABS
100.0000 mg | ORAL_TABLET | Freq: Every day | ORAL | Status: DC
  Administered 2012-08-13 – 2012-08-14 (×2): 100 mg via ORAL
  Filled 2012-08-13 (×3): qty 1

## 2012-08-13 MED ORDER — INSULIN ASPART 100 UNIT/ML ~~LOC~~ SOLN
0.0000 [IU] | SUBCUTANEOUS | Status: DC
  Administered 2012-08-14: 3 [IU] via SUBCUTANEOUS
  Administered 2012-08-14: 5 [IU] via SUBCUTANEOUS

## 2012-08-13 MED ORDER — POTASSIUM CHLORIDE 10 MEQ/100ML IV SOLN: 10.0000 meq | INTRAVENOUS | Status: DC

## 2012-08-13 MED ORDER — HYDROCODONE-ACETAMINOPHEN 5-325 MG PO TABS
1.0000 | ORAL_TABLET | ORAL | Status: DC | PRN
  Administered 2012-08-13 – 2012-08-15 (×7): 2 via ORAL
  Administered 2012-08-15: 1 via ORAL
  Administered 2012-08-15 (×2): 2 via ORAL
  Filled 2012-08-13 (×10): qty 2

## 2012-08-13 MED ORDER — ONDANSETRON HCL 4 MG/2ML IJ SOLN: 4.0000 mg | Freq: Once | INTRAMUSCULAR | Status: DC

## 2012-08-13 MED ORDER — SODIUM CHLORIDE 0.9 % IV SOLN: INTRAVENOUS | Status: AC

## 2012-08-13 MED ORDER — GABAPENTIN 300 MG PO CAPS
300.0000 mg | ORAL_CAPSULE | Freq: Three times a day (TID) | ORAL | Status: DC
  Administered 2012-08-13 – 2012-08-15 (×6): 300 mg via ORAL
  Filled 2012-08-13 (×7): qty 1

## 2012-08-13 MED ORDER — DEXTROSE-NACL 5-0.45 % IV SOLN
INTRAVENOUS | Status: DC
  Administered 2012-08-13 – 2012-08-14 (×2): via INTRAVENOUS

## 2012-08-13 MED ORDER — MIRTAZAPINE 15 MG PO TABS
15.0000 mg | ORAL_TABLET | Freq: Every day | ORAL | Status: DC
  Administered 2012-08-13 – 2012-08-14 (×2): 15 mg via ORAL
  Filled 2012-08-13 (×3): qty 1

## 2012-08-13 MED ORDER — HEPARIN SODIUM (PORCINE) 5000 UNIT/ML IJ SOLN: 5000.0000 [IU] | Freq: Three times a day (TID) | INTRAMUSCULAR | Status: DC

## 2012-08-13 MED ORDER — INSULIN GLARGINE 100 UNIT/ML ~~LOC~~ SOLN
15.0000 [IU] | Freq: Every day | SUBCUTANEOUS | Status: DC
  Administered 2012-08-14: 15 [IU] via SUBCUTANEOUS
  Filled 2012-08-13 (×2): qty 0.15

## 2012-08-13 MED ORDER — PANCRELIPASE (LIP-PROT-AMYL) 12000-38000 UNITS PO CPEP
2.0000 | ORAL_CAPSULE | Freq: Three times a day (TID) | ORAL | Status: DC
  Administered 2012-08-14 – 2012-08-15 (×6): 2 via ORAL
  Filled 2012-08-13 (×8): qty 2

## 2012-08-13 NOTE — ED Notes (Signed)
PT is here with flare up of his chronic pancreatitis since 0300.  Pt states upper abdominal pain, vomiting, and diarrhea

## 2012-08-13 NOTE — ED Provider Notes (Signed)
History    CSN: [id number] Arrival date & time 08/13/12  1209  First MD Initiated Contact with Patient 08/13/12 1307     Chief Complaint  Patient presents with  . Abdominal Pain   (Consider location/radiation/quality/duration/timing/severity/associated sxs/prior Treatment) HPI Comments: Pt is a 52yo male who presents to the ED with chronic abdominal pain worse for the past 3 days. Of note, pt has been seen in the ED for very similar complaints on 7/4 (3 days ago), was given pain medicine and fluids and sent home. Pt has chronic pancreatitis and now has similar pain, epigastric in nature, 7-8/10 worse with movement and heaving. Some left-sided pain with it, but generalized through his whole abdomen. Six episodes of emesis in the last 24 hours, last about 9 AM this morning. Not actively vomiting now but very nauseated. Pt reports he has been out of insulin at home for 3 days; previously seen at [facility] but has not had a regular doctor since they closed.  Otherwise denies fever/chills, change in stool, chest pain, SOB. Poor appetite the past few days, as well. Pt recently was in inpt detox for alcoholism.  The history is provided by the patient. No language interpreter was used.   Past Medical History  Diagnosis Date  . Diabetes mellitus   . Hypertension   . Seizures   . Pancreatitis   . Alcohol abuse   . Chronic abdominal pain   . Alcoholism /alcohol abuse   . Neuromuscular disorder 07/02/2012    Diabetic neuropathy  . NWGNFAOZ(308.6)    Past Surgical History  Procedure Laterality Date  . Appendectomy     Family History  Problem Relation Age of Onset  . Hypertension     History  Substance Use Topics  . Smoking status: Former Smoker    Types: Cigarettes    Quit date: 12/21/1984  . Smokeless tobacco: Never Used  . Alcohol Use: Yes     Comment:  detox program 7/14 heavy drinker: binge drinking per pt    Review of Systems  Allergies  Morphine and related  Home  Medications   Current Outpatient Rx  Name  Route  Sig  Dispense  Refill  . alum & mag hydroxide-simeth (MAALOX/MYLANTA) 200-200-20 MG/5ML suspension   Oral   Take 30 mLs by mouth every 4 (four) hours as needed for indigestion.         . diphenhydrAMINE (BENADRYL) 25 MG tablet   Oral   Take 1 tablet (25 mg total) by mouth every 6 (six) hours.   20 tablet   0   . gabapentin (NEURONTIN) 300 MG capsule   Oral   Take 1 capsule (300 mg total) by mouth 3 (three) times daily.   90 capsule   0   . insulin aspart (NOVOLOG) 100 UNIT/ML injection   Subcutaneous   Inject 0-10 Units into the skin See admin instructions. 4 times daily before each meal, 140-199 - 2 units, 200-250 - 4 units, 251-299 - 6 units, 300-349 - 8 units, 350 or above 10 units         . insulin glargine (LANTUS) 100 UNIT/ML injection   Subcutaneous   Inject 0.3 mLs (30 Units total) into the skin at bedtime. Was taking this as a home med.   10 mL   12   . lipase/protease/amylase (CREON-12/PANCREASE) 12000 UNITS CPEP   Oral   Take 2 capsules by mouth 3 (three) times daily.         Marland Kitchen lisinopril (  PRINIVIL,ZESTRIL) 10 MG tablet   Oral   Take 1 tablet (10 mg total) by mouth daily. For hypertension.   30 tablet   0   . loperamide (IMODIUM) 2 MG capsule   Oral   Take 2 mg by mouth 4 (four) times daily as needed for diarrhea or loose stools.         . metFORMIN (GLUCOPHAGE) 500 MG tablet   Oral   Take 500 mg by mouth 2 (two) times daily with a meal.         . mirtazapine (REMERON) 15 MG tablet   Oral   Take 1 tablet (15 mg total) by mouth at bedtime.   30 tablet   0   . naproxen (NAPROSYN) 500 MG tablet   Oral   Take 1 tablet (500 mg total) by mouth 2 (two) times daily.   14 tablet   0   . ondansetron (ZOFRAN) 4 MG tablet   Oral   Take 1 tablet (4 mg total) by mouth every 8 (eight) hours as needed for nausea.   10 tablet   0   . phenytoin (DILANTIN) 100 MG ER capsule   Oral   Take 2 capsules  (200 mg total) by mouth at bedtime. For prevention of seizures.   60 capsule   6   . simvastatin (ZOCOR) 10 MG tablet   Oral   Take 1 tablet (10 mg total) by mouth daily at 6 PM.   30 tablet   0   . traZODone (DESYREL) 100 MG tablet   Oral   Take 100 mg by mouth at bedtime.          BP 132/92  Pulse 116  Temp(Src) 98 F (36.7 C) (Oral)  Resp 20  SpO2 100% Physical Exam  Nursing note and vitals reviewed. Constitutional: He is oriented to person, place, and time. He appears well-developed and well-nourished. He appears distressed.  HENT:  Head: Normocephalic and atraumatic.  Eyes: Conjunctivae are normal. Pupils are equal, round, and reactive to light.  Neck: Normal range of motion. Neck supple.  Cardiovascular: Normal rate, regular rhythm and normal heart sounds.   No murmur heard. Pulmonary/Chest: Effort normal and breath sounds normal. No respiratory distress. He has no wheezes.  Abdominal: Soft. Bowel sounds are normal. There is generalized tenderness. There is guarding. There is no rigidity and no CVA tenderness.    Musculoskeletal: Normal range of motion. He exhibits no edema and no tenderness.  Neurological: He is alert and oriented to person, place, and time. No cranial nerve deficit.  Skin: Skin is warm and dry. He is not diaphoretic. No erythema.  Psychiatric: His behavior is normal.    ED Course  Procedures (including critical care time)   Date: 08/13/2012  Rate: 121  Rhythm: sinus  QRS Axis: normal  Intervals: normal  ST/T Wave abnormalities: scattered non-specific T-wave changes  Conduction Disutrbances: none  Narrative Interpretation: sinus tach with no frank ischemia  Old EKG Reviewed: No significant changes noted  1345: Significant pain on abd exam with Na 127 (corrects to 136), glucose 481. Ordered for NS bolus, pt given Fentanyl 50. Will reassess.  1445: Pain somewhat better but still nauseated. Pt agreeable to admission for sugar and pain  control. With gap of 16, early DKA possible so will start Glucomander protocol. Ordered for Zofran.  Labs Reviewed  COMPREHENSIVE METABOLIC PANEL - Abnormal; Notable for the following:    Sodium 127 (*)    Chloride 87 (*)  Glucose, Bld 481 (*)    All other components within normal limits  CBC WITH DIFFERENTIAL - Abnormal; Notable for the following:    HCT 38.6 (*)    MCHC 36.5 (*)    RDW 11.3 (*)    All other components within normal limits  URINALYSIS, ROUTINE W REFLEX MICROSCOPIC - Abnormal; Notable for the following:    Specific Gravity, Urine 1.045 (*)    Glucose, UA >1000 (*)    Ketones, ur 40 (*)    All other components within normal limits  LIPASE, BLOOD  URINE MICROSCOPIC-ADD ON  POCT I-STAT TROPONIN I   No results found. 1. Chronic pancreatitis   2. DKA (diabetic ketoacidoses)     MDM  52yo male with recurrent abdominal pain and hyperglycemia to 400's. Anion gap 16. Third ED visit in one week. Pt does not have a PCP and is out of insulin at home. Possible early DKA. Discussed with on-call resident for [facility], who will admit for pain and glycemic control.  The above was discussed in its entirety with attending ED physician Dr. Radford Pax.   Bobbye Morton, MD  PGY-2, [facility]  Bobbye Morton, MD 08/13/12 [phone]

## 2012-08-13 NOTE — H&P (Signed)
Seen and examined.  Discussed with Dr. Mikel Cella.  Agree with the admit and her documentation and management.  Briefly, 52 yo male admitted with hyperglycemia and abd pain. Issues 1. Hyperglycemia and positive gap meets criteria for DKA although this is a bit unusual for type 2 DM.  Will treat as DKA.  It is conceivable that his chronic pancreatitis has further impaired his ability to produce endogenous insulin. 2. Abd pain with nausea.  Epigastric and typical for his chronic pancreatitis.  The confounder is that lipase is normal.  He does not have a surgical abd and his initial labs are reassuring.  Will watch overnight, recheck lipase in the morning and consider advanced imaging if the pain does not resolve. 3. Medical non compliance due to lack of funds and loss of PCP.  Needs case manager involvement to discuss community support: the new community wellness center might be a good option for him for follow up.  His hospitalization was likely precipitated by him being out of his meds for the past week.   For other issues, see excellent resident note.

## 2012-08-13 NOTE — H&P (Signed)
Family Medicine Teaching [facility] Admission History and Physical Service Pager: [phone]  Patient name: Gabriel Rush Medical record number: [id number] Date of birth: 09-Dec-1960 Age: 52 y.o. Gender: male  Primary Care Provider: No PCP Per Patient Consultants: None Code Status: Full  Chief Complaint: abdominal pain  Assessment and Plan: Gabriel Rush is a 52 y.o. year old male presenting with hyperglycemia and abdominal pain, found to be in DKA.  # DKA - Patient's gap 16, CBG 481 and ketones in urine on admission. Patient is insulin dependent and has been out of insulin for a few days. - Admit to SDU, attending Dr. Leveda Anna - Begin glucose stabilizer - Bolus 1L now and continue NS at 150cc/hr, then transition to D51/2 NS at 125cc/hr when <250 and gap closes, per protocol - Bmet q2 hours x4. If gap has not closed, will continue standing Bmet. - A1C with next labs - Diabetes education - Will need outpatient PCP to help manage this after he leaves the hospital  # Pancreatitis - Known chronic pancreatitis, abdominal pain was initially concerning for acute flare, but current lipase 11. Continues to report pain and nausea - Monitor abdominal pain as DKA resolves - NPO on DKA protocol, but will slowly advance diet as tolerated - Zofran 4mg  PO q6 prn nausea - Norco prn moderate pain - Continue Creon  #HTN- BP elevated in ED, but patient is in pain. Has not taken medications. - Continue Lisinopril 10mg  - If remains elevated, will increase dose or add second agent - Hold NSAID  # HLD- Known history. Last LDL was 238 in April of this year. - Fasting lipid panel in the AM  - Continue home statin, but at increased dose of 40mg   # Seizure disorder - Not mentioned by patient, but in record. No AED in many days to weeks. Outpatient medication list states that he is on 200mg  qhs only. - Hold Phenytoin for now since he has been seizure free, but consider restarting.  # Depression  -  - Continue home Trazodone and Remeron - Consider further investigation into his depression or defer to outpatient PCP  # Neuropathy- Secondary to longstanding DM - Continue Neurontin   # Drug use- Patient endorses cocaine and marijuana use within the last several days - Encourage cessation  FEN/GI: Generous IVF, NPO for now but slowly advance Prophylaxis: Heparin subQ  Disposition: Home pending further work up and clinical improvement.  History of Present Illness: Gabriel Rush is a 52 y.o. year old male presenting with hyperglycemia and abdominal pain found to be in DKA. Patient has a PMH of insulin dependent DM II, chronic pancreatitis, HLD, HTN, former alcohol use and seizure disorder. He does not currently have a PCP. Patient has been seen in the ED multiple times in the last week for similar complaints. He reports he came in today for abdominal pain that started suddenly at 3am. Pain was associated with N/V/D, but no fevers. Decreased appetite and increased fatigue with associated headaches for last week. Patient states that since he does not have a PCP now he does not have any outpatient medications, therefore he has not had any insulin in at least 3 days and he has not checked blood sugar at home.   In the ED, patient had lab work that showed a gap of 16 and ketones in urine. (No VBG or serum ketones ordered.) Was given 1L bolus and started on glucostabilizer. Lipase was normal. Troponin negative. FPTS was called for admission due to probable  DKA.  Review Of Systems: Per HPI. Otherwise 12 point review of systems was performed and was unremarkable.  Patient Active Problem List   Diagnosis Date Noted  . Alcohol dependence 07/03/2012  . Alcohol abuse 07/01/2012  . Suicidal ideations 07/01/2012  . DM hyperosmolarity type II, uncontrolled 06/12/2012  . Dehydration 06/12/2012  . Nausea vomiting and diarrhea 06/12/2012  . Noncompliance with treatment 05/29/2012  . Unintentional  weight loss 05/29/2012  . Right foot drop 05/28/2012  . Cocaine abuse 05/28/2012  . Alcohol withdrawal syndrome 10/24/2011  . Abdominal pain 10/23/2011  . DKA (diabetic ketoacidoses) 10/23/2011  . Hypokalemia 03/19/2011  . DKA, type 2 03/15/2011  . Hematemesis 03/15/2011  . Chronic pancreatitis 03/15/2011  . CERVICAL RADICULOPATHY, RIGHT 12/29/2009  . PERS HX NONCOMPLIANCE W/MED TX PRS HAZARDS HLTH 12/29/2009  . PSEUDOCYST, PANCREAS 08/12/2009  . DEPRESSION 04/30/2009  . BENIGN PROSTATIC HYPERTROPHY, WITH OBSTRUCTION 09/23/2008  . INSOMNIA 10/11/2007  . VIRAL INFECTION 01/12/2007  . CONSTIPATION 10/17/2006  . HYPERLIPIDEMIA, MIXED 09/30/2006  . SEIZURE DISORDER 09/30/2006  . DIABETES MELLITUS, TYPE II, ON INSULIN 08/14/2006  . Personal history of other endocrine, metabolic, and immunity disorders 08/30/2005   Past Medical History: Past Medical History  Diagnosis Date  . Diabetes mellitus   . Hypertension   . Seizures   . Pancreatitis   . Alcohol abuse   . Chronic abdominal pain   . Alcoholism /alcohol abuse   . Neuromuscular disorder 07/02/2012    Diabetic neuropathy  . OZHYQMVH(846.9)    Past Surgical History: Past Surgical History  Procedure Laterality Date  . Appendectomy     Social History: History  Substance Use Topics  . Smoking status: Former Smoker    Types: Cigarettes    Quit date: 12/21/1984  . Smokeless tobacco: Never Used  . Alcohol Use: Yes     Comment:  detox program 7/14 heavy drinker: binge drinking per pt   Additional social history: Patient reports last drink at least 30 days ago. Cocaine use on July 4 and marijuana. Lives alone, does not work. Please also refer to relevant sections of EMR.  Family History: Family History  Problem Relation Age of Onset  . Hypertension     Allergies and Medications: Allergies  Allergen Reactions  . Morphine And Related Itching   No current facility-administered medications on file prior to encounter.    Current Outpatient Prescriptions on File Prior to Encounter  Medication Sig Dispense Refill  . alum & mag hydroxide-simeth (MAALOX/MYLANTA) 200-200-20 MG/5ML suspension Take 30 mLs by mouth every 4 (four) hours as needed for indigestion.      . diphenhydrAMINE (BENADRYL) 25 MG tablet Take 1 tablet (25 mg total) by mouth every 6 (six) hours.  20 tablet  0  . gabapentin (NEURONTIN) 300 MG capsule Take 1 capsule (300 mg total) by mouth 3 (three) times daily.  90 capsule  0  . insulin aspart (NOVOLOG) 100 UNIT/ML injection Inject 0-10 Units into the skin See admin instructions. 4 times daily before each meal, 140-199 - 2 units, 200-250 - 4 units, 251-299 - 6 units, 300-349 - 8 units, 350 or above 10 units      . insulin glargine (LANTUS) 100 UNIT/ML injection Inject 0.3 mLs (30 Units total) into the skin at bedtime. Was taking this as a home med.  10 mL  12  . lipase/protease/amylase (CREON-12/PANCREASE) 12000 UNITS CPEP Take 2 capsules by mouth 3 (three) times daily.      Marland Kitchen lisinopril (PRINIVIL,ZESTRIL) 10 MG  tablet Take 1 tablet (10 mg total) by mouth daily. For hypertension.  30 tablet  0  . loperamide (IMODIUM) 2 MG capsule Take 2 mg by mouth 4 (four) times daily as needed for diarrhea or loose stools.      . metFORMIN (GLUCOPHAGE) 500 MG tablet Take 500 mg by mouth 2 (two) times daily with a meal.      . mirtazapine (REMERON) 15 MG tablet Take 1 tablet (15 mg total) by mouth at bedtime.  30 tablet  0  . naproxen (NAPROSYN) 500 MG tablet Take 1 tablet (500 mg total) by mouth 2 (two) times daily.  14 tablet  0  . ondansetron (ZOFRAN) 4 MG tablet Take 1 tablet (4 mg total) by mouth every 8 (eight) hours as needed for nausea.  10 tablet  0  . phenytoin (DILANTIN) 100 MG ER capsule Take 2 capsules (200 mg total) by mouth at bedtime. For prevention of seizures.  60 capsule  6  . simvastatin (ZOCOR) 10 MG tablet Take 1 tablet (10 mg total) by mouth daily at 6 PM.  30 tablet  0    Objective: BP  132/92  Pulse 116  Temp(Src) 98 F (36.7 C) (Oral)  Resp 20  SpO2 100% Exam: General: Awake, alert. Lying in stretcher in NAD watching TC HEENT: Dry mucus membranes, posterior pharynx clear. Muddy sclera. Pupils equal and reactive Cardiovascular: RRR Respiratory: CTAB Abdomen: +BS, soft and flat. Diffuse TTP with guarding, most prominent in epigastrium. No rebound.  Extremities: Moves all extremities. IV in place LUE.  Skin: Multiple hyperpigmented macules on legs and arms Neuro: Grossly intact  Labs and Imaging: CBC BMET   Recent Labs Lab 08/13/12 1245  WBC 4.3  HGB 14.1  HCT 38.6*  PLT 226    Recent Labs Lab 08/13/12 1245  NA 127*  K 4.3  CL 87*  CO2 24  BUN 10  CREATININE 0.78  GLUCOSE 481*  CALCIUM 9.0     Lipase 11 Troponin neg   08/13/2012 12:35  Color, Urine YELLOW  APPearance CLEAR  Specific Gravity, Urine 1.045 (H)  pH 5.5  Glucose >1000 (A)  Bilirubin Urine NEGATIVE  Ketones, ur 40 (A)  Protein NEGATIVE  Urobilinogen, UA 0.2  Nitrite NEGATIVE  Leukocytes, UA NEGATIVE  Hgb urine dipstick NEGATIVE  Urine-Other MUCOUS PRESENT  WBC, UA 0-2    Hilarie Fredrickson, MD 08/13/2012, 3:54 PM PGY-3, [facility] FPTS Intern pager: [phone], text pages welcome

## 2012-08-14 DIAGNOSIS — R197 Diarrhea, unspecified: Secondary | ICD-10-CM

## 2012-08-14 DIAGNOSIS — E119 Type 2 diabetes mellitus without complications: Secondary | ICD-10-CM

## 2012-08-14 DIAGNOSIS — R112 Nausea with vomiting, unspecified: Secondary | ICD-10-CM

## 2012-08-14 DIAGNOSIS — E876 Hypokalemia: Secondary | ICD-10-CM

## 2012-08-14 LAB — GLUCOSE, CAPILLARY
Glucose-Capillary: 116 mg/dL — ABNORMAL HIGH (ref 70–99)
Glucose-Capillary: 133 mg/dL — ABNORMAL HIGH (ref 70–99)
Glucose-Capillary: 210 mg/dL — ABNORMAL HIGH (ref 70–99)
Glucose-Capillary: 343 mg/dL — ABNORMAL HIGH (ref 70–99)
Glucose-Capillary: 269 mg/dL — ABNORMAL HIGH (ref 70–99)

## 2012-08-14 LAB — BASIC METABOLIC PANEL
CO2: 23 mEq/L (ref 19–32)
Calcium: 8.9 mg/dL (ref 8.4–10.5)
Creatinine, Ser: 0.66 mg/dL (ref 0.50–1.35)
GFR calc Af Amer: >90 mL/min (ref >90)
GFR calc non Af Amer: >90 mL/min (ref >90)
Glucose, Bld: 184 mg/dL — ABNORMAL HIGH (ref 70–99)
Potassium: 3.5 mEq/L (ref 3.5–5.1)
Sodium: 132 mEq/L — ABNORMAL LOW (ref 135–145)

## 2012-08-14 LAB — CBC
HCT: 34.5 % — ABNORMAL LOW (ref 39.0–52.0)
Hemoglobin: 12.6 g/dL — ABNORMAL LOW (ref 13.0–17.0)
MCH: 29.8 pg (ref 26.0–34.0)
MCV: 81.6 fL (ref 78.0–100.0)
RBC: 4.23 MIL/uL (ref 4.22–5.81)

## 2012-08-14 LAB — LIPID PANEL
Cholesterol: 301 mg/dL — ABNORMAL HIGH (ref 0–200)
Total CHOL/HDL Ratio: 6 RATIO
Triglycerides: 346 mg/dL — ABNORMAL HIGH (ref <150)
VLDL: 69 mg/dL — ABNORMAL HIGH (ref 0–40)

## 2012-08-14 MED ORDER — POTASSIUM CHLORIDE 10 MEQ/100ML IV SOLN
10.0000 meq | INTRAVENOUS | Status: AC
Start: 2012-08-14 — End: 2012-08-14
  Administered 2012-08-14 (×4): 10 meq via INTRAVENOUS
  Filled 2012-08-14: qty 400

## 2012-08-14 MED ORDER — INSULIN ASPART 100 UNIT/ML ~~LOC~~ SOLN
0.0000 [IU] | Freq: Three times a day (TID) | SUBCUTANEOUS | Status: DC
  Administered 2012-08-14: 7 [IU] via SUBCUTANEOUS
  Administered 2012-08-14: 15 [IU] via SUBCUTANEOUS
  Administered 2012-08-15: 11 [IU] via SUBCUTANEOUS
  Administered 2012-08-15: 4 [IU] via SUBCUTANEOUS
  Administered 2012-08-15: 15 [IU] via SUBCUTANEOUS

## 2012-08-14 MED ORDER — SODIUM CHLORIDE 0.9 % IV SOLN
INTRAVENOUS | Status: DC
  Administered 2012-08-14: 02:00:00 via INTRAVENOUS

## 2012-08-14 MED ORDER — SODIUM CHLORIDE 0.9 % IV SOLN
INTRAVENOUS | Status: DC
  Administered 2012-08-14: 10:00:00 via INTRAVENOUS
  Administered 2012-08-14: 150 mL via INTRAVENOUS
  Administered 2012-08-14: 17:00:00 via INTRAVENOUS
  Administered 2012-08-15: 150 mL via INTRAVENOUS

## 2012-08-14 MED ORDER — INSULIN GLARGINE 100 UNIT/ML ~~LOC~~ SOLN
20.0000 [IU] | Freq: Every day | SUBCUTANEOUS | Status: DC
  Administered 2012-08-15: 20 [IU] via SUBCUTANEOUS
  Filled 2012-08-14 (×3): qty 0.2

## 2012-08-14 MED ORDER — INSULIN ASPART 100 UNIT/ML ~~LOC~~ SOLN: 0.0000 [IU] | Freq: Every day | SUBCUTANEOUS | Status: DC

## 2012-08-14 MED ORDER — POTASSIUM CHLORIDE CRYS ER 20 MEQ PO TBCR
40.0000 meq | EXTENDED_RELEASE_TABLET | Freq: Two times a day (BID) | ORAL | Status: AC
  Administered 2012-08-14 (×2): 40 meq via ORAL
  Filled 2012-08-14 (×2): qty 2

## 2012-08-14 NOTE — ED Provider Notes (Signed)
I saw and evaluated the patient, reviewed the resident's note and I agree with the findings and plan.   .Face to face Exam:  General:  Awake HEENT:  Atraumatic Resp:  Normal effort Abd:  Nondistended Neuro:No focal weakness Lymph: No adenopathy  Nelia Shi, MD 08/14/12 1704

## 2012-08-14 NOTE — Progress Notes (Signed)
Family Medicine Teaching Service Daily Progress Note Intern Pager: [phone]  Patient name: Gabriel Rush Medical record number: [id number] Date of birth: 10-08-1960 Age: 52 y.o. Gender: male  Primary Care Provider: No PCP Per Patient Consultants: Diabetes coordinator Code Status: Full  Pt Overview and Major Events to Date:   7/7: Insulin-dependent T2DM without insulin for 3 days in DKA admitted 7/8: AG 16 -> 11. CBG's <250 since 7/7 20:20  Assessment and Plan:  Gabriel Rush is a 52 y.o. year old male with insulin-dependent type II diabetes  presenting with hyperglycemia and abdominal pain, found to be in DKA.   # DKA - Pt appears to be insulin-dependent, perhaps pancreatitis contribution to insufficient endogenous insulin. Patient's gap has closed to 11 overnight with fluids and insulin drip.  - continuing glucose stabilizer  - NS @ 150cc/hr to continue hydration, pending UOPl. Will add K for developing hypokalemia.  - A1C 11.2 indicating chronically poor management - Diabetes education  - Will need outpatient PCP to help manage this after he leaves the hospital   # Chronic Pancreatitis - Lipase 11 on admission, still only 26. Continues to report pain and nausea  - Monitor abdominal pain as DKA resolves  - Advancing diet, currently clear liquids.  - Zofran 4mg  PO q6 prn nausea  - Norco prn moderate pain  - Continue Creon   #HTN- BPs trending downward as pain resolves.   - Continue Lisinopril 10mg   - If remains elevated, will increase dose or add second agent  - Hold NSAID   # HLD- Known history. Last LDL was 238 in April of this year.  - Fasting lipid panel this am shows LDL 182.  - Continue home statin, but at increased dose of 40mg    # Seizure disorder - Not mentioned by patient, but in record. No AED in many days to weeks. Outpatient medication list states that he is on 200mg  qhs only.  - Hold Phenytoin for now since he has been seizure free, but consider  restarting.   # Depression -  - Continue home Trazodone and Remeron  - Consider further investigation into his depression or defer to outpatient PCP   # Neuropathy- Secondary to longstanding DM  - Continue Neurontin   # Drug use- Patient endorses cocaine and marijuana use within the last several days. No UDS on admission.  Pt recently out of inpatient detox program.   - Encourage cessation, outpatient followup   FEN/GI: Soft diet, advance as tolerated, NS @ 100 cc/hr PPx: SubQ heparin  Disposition: To floor vs. D/C home pending continued clinical improvement. Outpatient PCP will be critical for this patient.   Subjective: Pt reports improved pain, feeling better generally.  Admits need to stop using illicit drugs and start taking diabetes medications.   Objective: Temp:  [97.8 F (36.6 C)-98.4 F (36.9 C)] 98.2 F (36.8 C) (07/08 0341) Pulse Rate:  [94-122] 94 (07/08 0341) Resp:  [11-20] 12 (07/08 0341) BP: (121-150)/(84-110) 130/93 mmHg (07/08 0341) SpO2:  [100 %] 100 % (07/08 0341) Weight:  [165 lb 12.6 oz (75.2 kg)-169 lb 12.1 oz (77 kg)] 169 lb 12.1 oz (77 kg) (07/08 0500) Physical Exam: General: Awake, alert. Lying in stretcher in NAD watching TC  HEENT: Dry mucus membranes, posterior pharynx clear. Muddy sclera. Pupils equal and reactive to light bilaterally  Cardiovascular: RRR, borderline tachycardic, no murmurs. Respiratory: CTAB, NWOB Abdomen: +BS, soft and flat. Diffuse mild TTP, most prominent in epigastrium. No rebound or guarding. Extremities: Moves all extremities.  IV in place LUE.  Skin: Multiple hyperpigmented macules on legs and arms  Neuro: Grossly intact   Laboratory:  Recent Labs Lab 08/13/12 1245 08/13/12 1751 08/14/12 0433  WBC 4.3 4.2 3.7*  HGB 14.1 12.8* 12.6*  HCT 38.6* 34.8* 34.5*  PLT 226 187 168    Recent Labs Lab 08/07/12 1151 08/10/12 0152 08/13/12 1245  08/13/12 2107 08/13/12 2300 08/14/12 0433  NA 131* 128* 127*  < > 134*  134* 134*  K 4.6 4.1 4.3  < > 3.8 3.3* 3.4*  CL 93* 93* 87*  < > 100 100 100  CO2 24 23 24   < > 22 25 23   BUN 12 14 10   < > 8 8 7   CREATININE 0.72 0.67 0.78  < > 0.58 0.56 0.66  CALCIUM 10.0 8.9 9.0  < > 8.8 8.6 8.9  PROT 8.0 7.0 7.6  --   --   --   --   BILITOT 0.3 0.4 0.5  --   --   --   --   ALKPHOS 99 87 102  --   --   --   --   ALT 30 25 34  --   --   --   --   AST 18 18 37  --   --   --   --   GLUCOSE 475* 482* 481*  < > 216* 150* 184*  < > = values in this interval not displayed.  7/7 U/A: Sp. Grav: 1.045, Ketones: 40, Glucose >1000, WBC/Protein/Hgb negative Hgb A1c: 11.2 7/8 Lipid Panel: TChol: 301, Trig: 346, LDLc: 182, VLDL: 69, HDL: 50  Imaging/Diagnostic Tests: 7/7 ECG: sinus tachycardia with rightward axis.   Gabriel Junker, MD 08/14/2012, 6:27 AM PGY-1, [facility] Family Medicine FPTS Intern pager: [phone], text pages welcome

## 2012-08-14 NOTE — Care Management Note (Signed)
    Page 1 of 1   08/14/2012     12:41:35 PM   CARE MANAGEMENT NOTE 08/14/2012  Patient:  Gabriel Rush, Gabriel Rush   Account Number:  [id number]  Date Initiated:  08/13/2012  Documentation initiated by:  Junius Creamer  Subjective/Objective Assessment:   adm w hyperglycemia     Action/Plan:   lives w mother   Anticipated DC Date:     Anticipated DC Plan:        DC Planning Services  CM consult  [facility]      Choice offered to / List presented to:             Status of service:   Medicare Important Message given?   (If response is "NO", the following Medicare IM given date fields will be blank) Date Medicare IM given:   Date Additional Medicare IM given:    Discharge Disposition:    Per UR Regulation:  Reviewed for med. necessity/level of care/duration of stay  If discussed at Long Length of Stay Meetings, dates discussed:    Comments:  7/8 1239p debbie Carrel Leather rn,bsn spoke w pt. no ins at present. [facility] was [facility] before [facility] closed. he's agreeable to [place] [facility]. have sent email to see if we can get him appt. gave pt 2 prescription discount cards that may help w brand name meds.

## 2012-08-14 NOTE — Progress Notes (Signed)
FMTS Attending Admission Note: Gabriel Atha,MD I  have seen and examined this patient, reviewed their chart. I have discussed this patient with the resident. I agree with the resident's findings, assessment and care plan.  Patient brought in for abdominal pain,N/V which he stated is the way he usually presents with his chronic pancreatitis,upon ED assessment he was found to have elevated serum glucose with anion gap. Patient did endorse being off his medication for weeks since he does not have a PCP to write his prescription,he does not check his capillary glucose at home as well. He was supposed to be on Lantus 35 unit daily at home.  Exam: Gen Comfortable in bed, in no distress. Resp: Air entry equal b/l CV: S1 S2 no murmurs. Abdomen: Mild generalized tenderness,no guarding or rebound tenderness,no mass or organomegaly. Ext; No edema. Neuro: grossly intact.  A/P: DKA: S/P insulin drip.        Agree with starting Lantus at low dose and gradually titrate up.        Continue sliding scale as well.        Continue monitoring BMet.        Advance diet as tolerated.  Chronic pancreatitis: Lipase wnl. Pain control as needed. IV hydration. Advance diet as tolerated.  HTN: Agree with Lisinopril          May go up on dose if BP remains elevated,goal should be <140/80 >100/60  HLD: Agree with increasing SImvastatin to 40 mg qd.

## 2012-08-14 NOTE — Progress Notes (Addendum)
Inpatient Diabetes Program Recommendations  AACE/ADA: New Consensus Statement on Inpatient Glycemic Control (2013)  Target Ranges:  Prepandial:   less than 140 mg/dL      Peak postprandial:   less than 180 mg/dL (1-2 hours)      Critically ill patients:  140 - 180 mg/dL   Results for DUPREE, GIVLER (MRN [id number]) as of 08/14/2012 17:13  Ref. Range 08/14/2012 03:02 08/14/2012 04:08 08/14/2012 07:55 08/14/2012 12:07 08/14/2012 16:44  Glucose-Capillary Latest Range: 70-99 mg/dL 440 (H) 347 (H) 425 (H) 210 (H) 343 (H)   Patient is well known to the Inpatient Diabetes Program and has been seen multiple times by our team.  The patient has many social issues that make Diabetes management difficult.   Our team will follow his glycemic control during this admission.  Patient will likely need Lantus 30 units (home dose) and Novolog with meals once tolerating PO diet.   Thank you  Piedad Climes [name], RN,CDE Inpatient Diabetes Coordinator [phone] (team pager)

## 2012-08-15 DIAGNOSIS — E43 Unspecified severe protein-calorie malnutrition: Secondary | ICD-10-CM | POA: Insufficient documentation

## 2012-08-15 DIAGNOSIS — Z9119 Patient's noncompliance with other medical treatment and regimen: Secondary | ICD-10-CM

## 2012-08-15 DIAGNOSIS — E1100 Type 2 diabetes mellitus with hyperosmolarity without nonketotic hyperglycemic-hyperosmolar coma (NKHHC): Secondary | ICD-10-CM

## 2012-08-15 DIAGNOSIS — F1020 Alcohol dependence, uncomplicated: Secondary | ICD-10-CM

## 2012-08-15 LAB — BASIC METABOLIC PANEL
BUN: 3 mg/dL — ABNORMAL LOW (ref 6–23)
CO2: 23 mEq/L (ref 19–32)
CO2: 26 mEq/L (ref 19–32)
Calcium: 8.9 mg/dL (ref 8.4–10.5)
Chloride: 100 mEq/L (ref 96–112)
Creatinine, Ser: 0.65 mg/dL (ref 0.50–1.35)
GFR calc Af Amer: >90 mL/min (ref >90)
GFR calc non Af Amer: >90 mL/min (ref >90)
Glucose, Bld: 379 mg/dL — ABNORMAL HIGH (ref 70–99)
Potassium: 3.9 mEq/L (ref 3.5–5.1)
Sodium: 132 mEq/L — ABNORMAL LOW (ref 135–145)

## 2012-08-15 LAB — GLUCOSE, CAPILLARY: Glucose-Capillary: 153 mg/dL — ABNORMAL HIGH (ref 70–99)

## 2012-08-15 MED ORDER — PROMETHAZINE HCL 12.5 MG PO TABS: 12.5000 mg | ORAL_TABLET | Freq: Four times a day (QID) | ORAL | Status: DC | PRN

## 2012-08-15 NOTE — Progress Notes (Addendum)
Family Medicine Teaching Service Daily Progress Note Intern Pager: [phone]  Patient name: Gabriel Rush Medical record number: [id number] Date of birth: [birth year] Age: 52 y.o. Gender: male  Primary Care Provider: No PCP Per Patient Consultants: Diabetes coordinator, CM Code Status: Full  Pt Overview and Major Events to Date:   7/7: Insulin-dependent T2DM without insulin for 3 days in DKA admitted. 7/8: AG 16 -> 11.  7/9: Stable. Likely D/C with close follow-up in place.   Assessment and Plan:  Gabriel Rush is a 52 y.o. year old male with insulin-dependent type II diabetes  presenting with hyperglycemia and abdominal pain, found to be in DKA.   # DKA - Pt appears to be insulin-dependent, perhaps pancreatitis contribution to insufficient endogenous insulin. Patient's gap has closed to 11 by 7/8. - continuing glucose stabilizer  - NS @ 150cc/hr with oral K, recheck BMP now.  - A1C 11.2 - Diabetes education given. Coordinator recommending home dose (30 U) lantus  - Will need outpatient PCP to help manage this after he leaves the hospital   # Chronic Pancreatitis - Lipase 11 on admission, still only 26. Continues to report pain and nausea  - Abdominal pain improving - Advancing diet, currently clear liquids.  - Zofran 4mg  PO q6 prn nausea  - Norco prn moderate pain  - Continue Creon   #HTN- BPs remain high overnight. 134-149/96-105 - Continue Lisinopril 10mg   - Consider increase dose or add second agent  - Holding NSAID   # HLD- Known history. Last LDL was 238 in April of this year.  - Fasting lipid panel this am shows LDL 182.  - Continue home statin, but at increased dose of 40mg  [ ]  f/u as outpatient   # Seizure disorder - Not mentioned by patient, but in record. No recent episodes. Outpatient medication list states that he is on dilantin 200mg  qhs only, though he has not taken this for weeks-months.  - Restarted dilantin 400mg  x3days on 7/7, then to home dose  200mg    # Depression -  - Continue home Trazodone and Remeron  [ ]  f/u as outpatient   # Neuropathy- Secondary to longstanding DM  - Continue Neurontin   # Drug use- Patient endorses cocaine and marijuana use within the last several days. No UDS on admission.  Pt recently out of inpatient detox program, intermittent AA meeting attendance without a sponsor.  - Encourage cessation, social work consulted  FEN/GI: NS @ 150cc/hr, advancing today to diabetic diet PPx: SubQ heparin  Disposition: D/C pending continued clinical improvement. CM seeking [facility] app[facility]. Outpatient PCP will be critical for this patient.   Subjective: Reports improved but remnant abdominal pain. Normal BM this am.  Admits need to stop using illicit drugs and start taking diabetes medications. Says does not have any way of getting prescriptions because he has no money at all.    Objective: Temp:  [98.3 F (36.8 C)-98.8 F (37.1 C)] 98.5 F (36.9 C) (07/09 0506) Pulse Rate:  [98-106] 102 (07/09 0506) Resp:  [16-18] 18 (07/09 0506) BP: (134-149)/(96-109) 149/105 mmHg (07/09 0506) SpO2:  [98 %-100 %] 98 % (07/09 0506) Weight:  [175 lb 14.8 oz (79.8 kg)] 175 lb 14.8 oz (79.8 kg) (07/08 2024) Physical Exam: General: Awake, alert in NAD  HEENT: moist mucus membranes, posterior pharynx clear. Muddy sclera. Pupils equal and reactive to light bilaterally  Cardiovascular: RRR, borderline tachycardic, no murmurs. Respiratory: CTAB, NWOB Abdomen: +BS, soft and flat. Diffuse  mild TTP, most prominent in epigastrium. No rebound or guarding.  Extremities: Moves all extremities. IV in place LUE.  Skin: Multiple hyperpigmented macules on legs and arms  Neuro: Grossly intact  Laboratory:  Recent Labs Lab 08/13/12 1245 08/13/12 1751 08/14/12 0433  WBC 4.3 4.2 3.7*  HGB 14.1 12.8* 12.6*  HCT 38.6* 34.8* 34.5*  PLT 226 187 168    Recent Labs Lab 08/10/12 0152 08/13/12 1245   08/13/12 2300 08/14/12 0433 08/14/12 1000  NA 128* 127*  < > 134* 134* 132*  K 4.1 4.3  < > 3.3* 3.4* 3.5  CL 93* 87*  < > 100 100 99  CO2 23 24  < > 25 23 24   BUN 14 10  < > 8 7 6   CREATININE 0.67 0.78  < > 0.56 0.66 0.63  CALCIUM 8.9 9.0  < > 8.6 8.9 8.9  PROT 7.0 7.6  --   --   --   --   BILITOT 0.4 0.5  --   --   --   --   ALKPHOS 87 102  --   --   --   --   ALT 25 34  --   --   --   --   AST 18 37  --   --   --   --   GLUCOSE 482* 481*  < > 150* 184* 268*  < > = values in this interval not displayed.  7/7 U/A: Sp. Grav: 1.045, Ketones: 40, Glucose >1000, WBC/Protein/Hgb negative Hgb A1c: 11.2 7/8 Lipid Panel: TChol: 301, Trig: 346, LDLc: 182, VLDL: 69, HDL: 50 Lipase (7/7): 11 (7/8): 26  Imaging/Diagnostic Tests: 7/7 ECG: sinus tachycardia with rightward axis.   Hazeline Junker, MD 08/15/2012, 7:12 AM PGY-1, [facility] FPTS Intern pager: [phone], text pages welcome

## 2012-08-15 NOTE — Discharge Summary (Signed)
FMTS Attending Admission Note: Gabriel Solivan,MD I  have seen and examined this patient, reviewed their chart. I have discussed this patient with the resident. I agree with the resident's findings, assessment and care plan.  

## 2012-08-15 NOTE — Progress Notes (Signed)
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  Supplements; Glucerna Shake po BID, each supplement provides 220 kcal and 10 grams of protein. 2.  MVI daily  NUTRITION DIAGNOSIS: Inadequate oral intake related to depression and homelessness as evidenced by ongoing wt loss and poorly controlled diabetes.   Goal: Pt to meet >/= 90% of their estimated nutrition needs  Monitor:  1.  Food/Beverage; pt meeting >/=90% estimated needs with tolerance. 2.  Wt/wt change; monitor trends  Reason for Assessment: Malnutrition Screening  52 y.o. male  Admitting Dx: DKA  ASSESSMENT: Pt with recurrent admissions for DKA.  Pt previously reported usual wt as 205 lbs. Pt planning for d/c today.  Meeting with RN at time of visit to discuss diabetes management.   Pt with additional 3 lb wt loss since admission earlier this month. Pt meets criteria for severe MALNUTRITION in the context of social/environmental circumstances as evidenced by wt loss of 28% of body weight x 1 year and intake <75% of estimated energy needs x at least 1 months.  Lab Results  Component Value Date   HGBA1C 11.2* 08/13/2012  **HgBA1C is improving since last admission.    Height: Ht Readings from Last 1 Encounters:  08/14/12 5\' 11"  (1.803 m)    Weight: Wt Readings from Last 1 Encounters:  08/14/12 175 lb 14.8 oz (79.8 kg)    Ideal Body Weight: 172 lbs  % Ideal Body Weight: 92%  Wt Readings from Last 10 Encounters:  08/14/12 175 lb 14.8 oz (79.8 kg)  07/25/12 180 lb (81.647 kg)  07/20/12 180 lb (81.647 kg)  07/02/12 178 lb (80.74 kg)  07/01/12 158 lb (71.668 kg)  06/11/12 165 lb 12.6 oz (75.2 kg)  05/29/12 158 lb 11.7 oz (72 kg)  10/23/11 205 lb 7.5 oz (93.2 kg)  06/04/11 218 lb 7.6 oz (99.1 kg)  05/20/11 219 lb (99.338 kg)    Usual Body Weight: 205 lbs  % Usual Body Weight:  85%  BMI:  Body mass index is 24.55 kg/(m^2). Normal weight  Estimated Nutritional Needs: Kcal: 1610-9604 Protein: 75-90 grams Fluid: 2.2-2.5 L  Skin: intact  Diet Order: Carb Control Medium (1600 - 2000)  EDUCATION NEEDS: -No education needs identified at this time   Intake/Output Summary (Last 24 hours) at 08/15/12 1521 Last data filed at 08/15/12 1300  Gross per 24 hour  Intake   4380 ml  Output    750 ml  Net   3630 ml    Last BM: 7/8  Labs:   Recent Labs Lab 08/14/12 1000 08/15/12 0640 08/15/12 0915  NA 132* 132* 134*  K 3.5 4.1 3.9  CL 99 100 100  CO2 24 23 26   BUN 6 <3* 3*  CREATININE 0.63 0.66 0.65  CALCIUM 8.9 8.9 8.9  GLUCOSE 268* 390* 379*   Lab Results  Component Value Date   HGBA1C 11.2* 08/13/2012    CBG (last 3)   Recent Labs  08/14/12 2226 08/15/12 0754 08/15/12 1218  GLUCAP 321* 336* 282*    Scheduled Meds: . aspirin EC  81 mg Oral Daily  . gabapentin  300 mg Oral TID  . insulin aspart  0-20 Units Subcutaneous TID WC  . insulin glargine  20 Units Subcutaneous QHS  . lipase/protease/amylase  2 capsule Oral TID WC  . lisinopril  10 mg Oral Daily  . mirtazapine  15 mg Oral QHS  . phenytoin  400 mg Oral QHS   Followed by  . [START ON 08/16/2012] phenytoin  200 mg Oral QHS  . simvastatin  40 mg Oral q1800  . traZODone  100 mg Oral QHS    Continuous Infusions:    Past Medical History  Diagnosis Date  . Hypertension   . Pancreatitis   . Alcohol abuse   . Chronic abdominal pain   . Alcoholism /alcohol abuse   . Neuromuscular disorder 07/02/2012    Diabetic neuropathy  . Hypercholesteremia   . Type II diabetes mellitus   . ZOXWRUEA(540.9)     "weekly" (08/13/2012)  . Grand mal     "maybe once/month" (08/13/2012)  . Arthritis     "hands & arms" (08/13/2012)  . Depression     Past Surgical History  Procedure Laterality Date  . Appendectomy  1970's    Loyce Dys, MS RD LDN Clinical Inpatient Dietitian Pager:  [phone] Weekend/After hours pager: [phone]

## 2012-08-15 NOTE — Progress Notes (Signed)
FMTS Attending Admission Note: Gabriel Gillentine,MD I  have seen and examined this patient, reviewed their chart. I have discussed this patient with the resident. I agree with the resident's findings, assessment and care plan.  Patient improved a lot today,no N/V or change in BM,his abdominal pain improved. On admission serum Glucose was 400+, today serum glucose is 300+ with rechecked capillary glucose in the 200s this is on Lantus 20 units which is 10 unit less than his home dose,plan to send him home on his regular 30 unit Lantus and establish care with new PCP for follow up.

## 2012-08-15 NOTE — Care Management Note (Addendum)
   CARE MANAGEMENT NOTE 08/15/2012  Patient:  Gabriel Rush, Gabriel Rush   Account Number:  [id number]  Date Initiated:  08/13/2012  Documentation initiated by:  Gabriel Rush  Subjective/Objective Assessment:   adm w hyperglycemia     Action/Plan:   lives w mother. 08/15/12 Met with pt re followup care, please see note below.  Also discussed Medications with this pt.   Anticipated DC Date:  08/15/2012   Anticipated DC Plan:  HOME/SELF CARE      DC Planning Services  CM consult  [facility]      Choice offered to / List presented to:             Status of service:  Completed, signed off Medicare Important Message given?   (If response is "NO", the following Medicare IM given date fields will be blank) Date Medicare IM given:   Date Additional Medicare IM given:    Discharge Disposition:  HOME/SELF CARE  Per UR Regulation:  Reviewed for med. necessity/level of care/duration of stay  If discussed at Long Length of Stay Meetings, dates discussed:    Comments:      08/15/2012 Able to arrange hospital followup appointment for this pt at the [facility] for August 22, 2012 @ 12noon, also arranged an eligibility appointment at [facility] for September 14, 2012 @ 1:30pm ( earliest available). This will give pt the option of going to the [facility], obtaining [facility] card and no copay, whereas the [facility] requires a $20 copay. Dr Gabriel Rush notified. Gabriel Shock RN MPH, case manager, [phone]    Medication Assistance program this year so is not eligible for further assistance until May 2015. Pt states that he can go to [facility]. Pt request bus passes and this CM, notifed CSW of need for bus pass.  Dr notified of inability to assist this pt with medications, and asked to consider [facility] $4 drugs as much as possible. Gabriel Shock RN MPH, case manager, [phone]   7/8 1239p Gabriel dowell rn,bsn spoke w pt. no ins at present. he  [facility] closed. he's agreeable to [place] [facility]. have sent email to see if we can get him appt. gave pt 2 prescription discount cards that may help w brand name meds.

## 2012-08-15 NOTE — Progress Notes (Addendum)
Inpatient Diabetes Program Recommendations  AACE/ADA: New Consensus Statement on Inpatient Glycemic Control (2013)  Target Ranges:  Prepandial:   less than 140 mg/dL      Peak postprandial:   less than 180 mg/dL (1-2 hours)      Critically ill patients:  140 - 180 mg/dL     Results for Gabriel Rush, Gabriel Rush (MRN [id number]) as of 08/15/2012 11:01  Ref. Range 08/14/2012 07:55 08/14/2012 12:07 08/14/2012 16:44 08/14/2012 17:20 08/14/2012 20:22 08/14/2012 22:26  Glucose-Capillary Latest Range: 70-99 mg/dL 409 (H) 811 (H) 914 (H) 359 (H) 269 (H) 321 (H)    Results for Gabriel Rush, Gabriel Rush (MRN [id number]) as of 08/15/2012 11:01  Ref. Range 08/15/2012 07:54  Glucose-Capillary Latest Range: 70-99 mg/dL 086 (H)    Recommend the following in-hospital insulin adjustments:  1. Increase Lantus to 30 units QHS (home dose) 2. Add Novolog meal coverage- Novolog 4 units tid with meals (patient eating 100% of meals)  *Patient well known to the Inpatient DM Program- Has had 4 admissions since January and has had 13 ED visits since January (most complaints are hyperglycemia, abdominal pain, ETOH intoxication)   Will follow. Ambrose Finland RN, MSN, CDE Diabetes Coordinator Inpatient Diabetes Program [phone]

## 2012-08-15 NOTE — Discharge Summary (Signed)
Family Medicine Teaching [facility] Discharge Summary  Patient name: Gabriel Rush Medical record number: [id number] Date of birth: 05-15-1960 Age: 52 y.o. Gender: male Date of Admission: 08/13/2012  Date of Discharge: 08/15/2012 Admitting Physician: Sanjuana Letters, MD  Primary Care Provider: No PCP Per Patient Consultants: Diabetes Coordinator, Case Management, Social Work  Indication for Hospitalization: insulin-dependent type II diabetic presenting with hyperglycemia and abdominal pain, found to be in DKA.   Discharge Diagnoses/Problem List:  Insulin-dependent type-II diabetes mellitus Chronic pancreatitis Hypertension Hyperlipidemia Substance Abuse Seizure disorder Depression Diabetic Neuropathy  Disposition: D/C to home   Discharge Condition: Stable  Brief Hospital Course: Gabriel Rush presented on 7/7 with abdominal pain and hyperglycemia after several weeks without insulin and other medications, found to be in DKA. He was admitted and fluid resuscitated with improvement in his abdominal pain and closure of the anion gap.  He was monitored on 7/8, and discharged in stable condition of 7/9. Previously received care at [facility], before that clinic was shut down. Case managers have provided Gabriel Rush with an appointment at [facility] for July 16th @ 12 PM.   # T2DM: A1c was 11.2 and received care from the diabetes coordinator.  His home insulin regimen is 30 U Lantus qHS and Novolog SSI with meals.  Poor control of his condition is due to medical non-adherence, lack of PCP, active drug abuse, and unemployment  # Substance Abuse: Gabriel Rush admits to actively drinking alcohol and using illicit drugs, despite recent inpatient detox from which he was recently discharged.  He seems aware of the need for recovery, having attended support meetings in the past, but unfortunately seems to continue to lack motivation to discontinue illicit substances given his  frequent hospitalizations for issues related to medical noncompliance.  # Chronic pancreatitis: Lipase remained wnl during this admission, he tolerated advancing diet. We continued Creon and provided control of nausea and pain with zofran and norco.   # HTN: Lisinopril 10mg  was continued with only moderate control of blood pressure. Pain is a complicating factor, but a dose increase or addition of second agent will likely be necessary to control hypertension.    # HLD: April 2014 LDL was 238, and fasting LDL on 7/8 showed improvement to 182, indicating some compliance with statin. His dose was increased to lipitor 40mg .   # Depression: He was continued on trazodone and remeron, with strong recommendation for PCP follow-up to address this important co-morbidity.   # Seizure disorder: Pt without anti-seizure medication for week without seizure. He was restarted on dilantin at 400mg  x3 days, and discharged on regular home dose of 200mg .   # Neuropathy: Sequela of uncontrolled T2DM, continued neurontin.   Issues for Follow Up:  1) Establishment and maintenance of reliable outpatient PCP followup. 2) Consider outpatient counseling program for drug abuse, address anti-depressants. 3) Chronic disease management: Statin, Anti-hypertensives.  Significant Procedures: None  Significant Labs and Imaging:   Recent Labs Lab 08/13/12 1245 08/13/12 1751 08/14/12 0433  WBC 4.3 4.2 3.7*  HGB 14.1 12.8* 12.6*  HCT 38.6* 34.8* 34.5*  PLT 226 187 168    Recent Labs Lab 08/10/12 0152 08/13/12 1245  08/13/12 2300 08/14/12 0433 08/14/12 1000 08/15/12 0640 08/15/12 0915  NA 128* 127*  < > 134* 134* 132* 132* 134*  K 4.1 4.3  < > 3.3* 3.4* 3.5 4.1 3.9  CL 93* 87*  < > 100 100 99 100 100  CO2 23 24  < > 25 23 24  23 26  GLUCOSE 482* 481*  < > 150* 184* 268* 390* 379*  BUN 14 10  < > 8 7 6  <3* 3*  CREATININE 0.67 0.78  < > 0.56 0.66 0.63 0.66 0.65  CALCIUM 8.9 9.0  < > 8.6 8.9 8.9 8.9 8.9   ALKPHOS 87 102  --   --   --   --   --   --   AST 18 37  --   --   --   --   --   --   ALT 25 34  --   --   --   --   --   --   ALBUMIN 3.5 3.9  --   --   --   --   --   --   < > = values in this interval not displayed. 7/7 U/A: Sp. Grav: 1.045, Ketones: 40, Glucose >1000, WBC/Protein/Hgb negative  Hgb A1c: 11.2  7/8 Lipid Panel: TChol: 301, Trig: 346, LDLc: 182, VLDL: 69, HDL: 50  Outstanding Results: None  Discharge Medications:    Medication List    STOP taking these medications       ondansetron 4 MG tablet  Commonly known as:  ZOFRAN      TAKE these medications       alum & mag hydroxide-simeth 200-200-20 MG/5ML suspension  Commonly known as:  MAALOX/MYLANTA  Take 30 mLs by mouth every 4 (four) hours as needed for indigestion.     diphenhydrAMINE 25 MG tablet  Commonly known as:  BENADRYL  Take 1 tablet (25 mg total) by mouth every 6 (six) hours.     gabapentin 300 MG capsule  Commonly known as:  NEURONTIN  Take 1 capsule (300 mg total) by mouth 3 (three) times daily.     insulin aspart 100 UNIT/ML injection  Commonly known as:  novoLOG  Inject 0-10 Units into the skin See admin instructions. 4 times daily before each meal, 140-199 - 2 units, 200-250 - 4 units, 251-299 - 6 units, 300-349 - 8 units, 350 or above 10 units     insulin glargine 100 UNIT/ML injection  Commonly known as:  LANTUS  Inject 0.3 mLs (30 Units total) into the skin at bedtime. Was taking this as a home med.     lipase/protease/amylase 44010 UNITS Cpep  Commonly known as:  CREON-12/PANCREASE  Take 2 capsules by mouth 3 (three) times daily.     lisinopril 10 MG tablet  Commonly known as:  PRINIVIL,ZESTRIL  Take 1 tablet (10 mg total) by mouth daily. For hypertension.     loperamide 2 MG capsule  Commonly known as:  IMODIUM  Take 2 mg by mouth 4 (four) times daily as needed for diarrhea or loose stools.     metFORMIN 500 MG tablet  Commonly known as:  GLUCOPHAGE  Take 500 mg by mouth 2  (two) times daily with a meal.     mirtazapine 15 MG tablet  Commonly known as:  REMERON  Take 1 tablet (15 mg total) by mouth at bedtime.     naproxen 500 MG tablet  Commonly known as:  NAPROSYN  Take 1 tablet (500 mg total) by mouth 2 (two) times daily.     phenytoin 100 MG ER capsule  Commonly known as:  DILANTIN  Take 2 capsules (200 mg total) by mouth at bedtime. For prevention of seizures.     promethazine 12.5 MG tablet  Commonly known as:  PHENERGAN  Take 1 tablet (12.5 mg total) by mouth every 6 (six) hours as needed for nausea.     simvastatin 10 MG tablet  Commonly known as:  ZOCOR  Take 1 tablet (10 mg total) by mouth daily at 6 PM.     traZODone 100 MG tablet  Commonly known as:  DESYREL  Take 100 mg by mouth at bedtime.        Discharge Instructions: Please refer to Patient Instructions section of EMR for full details.  Patient was counseled important signs and symptoms that should prompt return to medical care, changes in medications, dietary instructions, activity restrictions, and follow up appointments.   Follow-Up Appointments: Follow-up Information   Please follow up. (Please go to [facility] Department for Medication Assistance Program (MAP). This will help you get you insulin and other medications, which will keep you from having to return to the hospital. )       Follow up with [facility]. (7/16 @ 12 PM)    Contact information:   [address] [place], [place] [address]      [facility] (August 8th, please come in for financial assitance)    Contact information:   [address] [place] [address]         Hazeline Junker, MD 08/15/2012, 7:29 AM PGY-1, [facility]   I updated the D/c Summary above.   Twana First Paulina Fusi, DO of [facility] 08/15/2012, 3:18 PM

## 2012-08-18 ENCOUNTER — Encounter (HOSPITAL_COMMUNITY): Payer: Self-pay | Admitting: *Deleted

## 2012-08-18 ENCOUNTER — Emergency Department (HOSPITAL_COMMUNITY)
Admission: EM | Admit: 2012-08-18 | Discharge: 2012-08-18 | Disposition: A | Discharge status: Home / Self Care | Payer: Self-pay | Attending: Emergency Medicine | Admitting: Emergency Medicine

## 2012-08-18 DIAGNOSIS — Z9114 Patient's other noncompliance with medication regimen: Secondary | ICD-10-CM

## 2012-08-18 DIAGNOSIS — Z79899 Other long term (current) drug therapy: Secondary | ICD-10-CM | POA: Insufficient documentation

## 2012-08-18 DIAGNOSIS — R109 Unspecified abdominal pain: Secondary | ICD-10-CM | POA: Insufficient documentation

## 2012-08-18 DIAGNOSIS — Z791 Long term (current) use of non-steroidal anti-inflammatories (NSAID): Secondary | ICD-10-CM | POA: Insufficient documentation

## 2012-08-18 DIAGNOSIS — Z8719 Personal history of other diseases of the digestive system: Secondary | ICD-10-CM | POA: Insufficient documentation

## 2012-08-18 DIAGNOSIS — R112 Nausea with vomiting, unspecified: Secondary | ICD-10-CM | POA: Insufficient documentation

## 2012-08-18 DIAGNOSIS — E1149 Type 2 diabetes mellitus with other diabetic neurological complication: Secondary | ICD-10-CM | POA: Insufficient documentation

## 2012-08-18 DIAGNOSIS — Z794 Long term (current) use of insulin: Secondary | ICD-10-CM | POA: Insufficient documentation

## 2012-08-18 DIAGNOSIS — F1410 Cocaine abuse, uncomplicated: Secondary | ICD-10-CM | POA: Insufficient documentation

## 2012-08-18 DIAGNOSIS — E7800 Pure hypercholesterolemia, unspecified: Secondary | ICD-10-CM | POA: Insufficient documentation

## 2012-08-18 DIAGNOSIS — Z8669 Personal history of other diseases of the nervous system and sense organs: Secondary | ICD-10-CM | POA: Insufficient documentation

## 2012-08-18 DIAGNOSIS — F3289 Other specified depressive episodes: Secondary | ICD-10-CM | POA: Insufficient documentation

## 2012-08-18 DIAGNOSIS — G8929 Other chronic pain: Secondary | ICD-10-CM | POA: Insufficient documentation

## 2012-08-18 DIAGNOSIS — G40309 Generalized idiopathic epilepsy and epileptic syndromes, not intractable, without status epilepticus: Secondary | ICD-10-CM | POA: Insufficient documentation

## 2012-08-18 DIAGNOSIS — Z87891 Personal history of nicotine dependence: Secondary | ICD-10-CM | POA: Insufficient documentation

## 2012-08-18 DIAGNOSIS — E1142 Type 2 diabetes mellitus with diabetic polyneuropathy: Secondary | ICD-10-CM | POA: Insufficient documentation

## 2012-08-18 DIAGNOSIS — M129 Arthropathy, unspecified: Secondary | ICD-10-CM | POA: Insufficient documentation

## 2012-08-18 DIAGNOSIS — E1169 Type 2 diabetes mellitus with other specified complication: Secondary | ICD-10-CM | POA: Insufficient documentation

## 2012-08-18 DIAGNOSIS — I10 Essential (primary) hypertension: Secondary | ICD-10-CM | POA: Insufficient documentation

## 2012-08-18 DIAGNOSIS — F1021 Alcohol dependence, in remission: Secondary | ICD-10-CM | POA: Insufficient documentation

## 2012-08-18 DIAGNOSIS — F329 Major depressive disorder, single episode, unspecified: Secondary | ICD-10-CM | POA: Insufficient documentation

## 2012-08-18 LAB — COMPREHENSIVE METABOLIC PANEL
ALT: 36 U/L (ref 0–53)
AST: 25 U/L (ref 0–37)
Albumin: 3.6 g/dL (ref 3.5–5.2)
CO2: 27 mEq/L (ref 19–32)
Chloride: 89 mEq/L — ABNORMAL LOW (ref 96–112)
GFR calc non Af Amer: >90 mL/min (ref >90)
Sodium: 126 mEq/L — ABNORMAL LOW (ref 135–145)
Total Bilirubin: 0.3 mg/dL (ref 0.3–1.2)

## 2012-08-18 LAB — RAPID URINE DRUG SCREEN, HOSP PERFORMED
Amphetamines: NOT DETECTED
Benzodiazepines: NOT DETECTED
Opiates: NOT DETECTED

## 2012-08-18 LAB — URINALYSIS, ROUTINE W REFLEX MICROSCOPIC
Glucose, UA: >1000 mg/dL — AB
Leukocytes, UA: NEGATIVE
Nitrite: NEGATIVE
Specific Gravity, Urine: 1.042 — ABNORMAL HIGH (ref 1.005–1.030)
pH: 5.5 (ref 5.0–8.0)

## 2012-08-18 LAB — URINE MICROSCOPIC-ADD ON

## 2012-08-18 LAB — GLUCOSE, CAPILLARY
Glucose-Capillary: 465 mg/dL — ABNORMAL HIGH (ref 70–99)
Glucose-Capillary: 393 mg/dL — ABNORMAL HIGH (ref 70–99)
Glucose-Capillary: 343 mg/dL — ABNORMAL HIGH (ref 70–99)

## 2012-08-18 LAB — CBC
Hemoglobin: 13.3 g/dL (ref 13.0–17.0)
MCH: 30.2 pg (ref 26.0–34.0)
MCHC: 36.6 g/dL — ABNORMAL HIGH (ref 30.0–36.0)
MCV: 82.3 fL (ref 78.0–100.0)
RBC: 4.41 MIL/uL (ref 4.22–5.81)

## 2012-08-18 LAB — LIPASE, BLOOD: Lipase: 12 U/L (ref 11–59)

## 2012-08-18 LAB — ETHANOL: Alcohol, Ethyl (B): <11 mg/dL (ref 0–11)

## 2012-08-18 MED ORDER — SODIUM CHLORIDE 0.9 % IV SOLN
1000.0000 mL | Freq: Once | INTRAVENOUS | Status: AC
  Administered 2012-08-18: 1000 mL via INTRAVENOUS

## 2012-08-18 MED ORDER — INSULIN ASPART 100 UNIT/ML ~~LOC~~ SOLN
10.0000 [IU] | Freq: Once | SUBCUTANEOUS | Status: AC
  Administered 2012-08-18: 10 [IU] via SUBCUTANEOUS
  Filled 2012-08-18: qty 1

## 2012-08-18 MED ORDER — FAMOTIDINE IN NACL 20-0.9 MG/50ML-% IV SOLN
20.0000 mg | Freq: Once | INTRAVENOUS | Status: AC
  Administered 2012-08-18: 20 mg via INTRAVENOUS
  Filled 2012-08-18: qty 50

## 2012-08-18 MED ORDER — INSULIN GLARGINE 100 UNIT/ML ~~LOC~~ SOLN
30.0000 [IU] | Freq: Once | SUBCUTANEOUS | Status: AC
  Administered 2012-08-18: 30 [IU] via SUBCUTANEOUS
  Filled 2012-08-18: qty 0.3

## 2012-08-18 MED ORDER — SODIUM CHLORIDE 0.9 % IV SOLN
1000.0000 mL | INTRAVENOUS | Status: DC
  Administered 2012-08-18: 1000 mL via INTRAVENOUS

## 2012-08-18 MED ORDER — METOCLOPRAMIDE HCL 5 MG/ML IJ SOLN
10.0000 mg | Freq: Once | INTRAMUSCULAR | Status: AC
  Administered 2012-08-18: 10 mg via INTRAVENOUS
  Filled 2012-08-18: qty 2

## 2012-08-18 MED ORDER — INSULIN ASPART 100 UNIT/ML ~~LOC~~ SOLN
8.0000 [IU] | Freq: Once | SUBCUTANEOUS | Status: AC
  Administered 2012-08-18: 8 [IU] via INTRAVENOUS
  Filled 2012-08-18: qty 1

## 2012-08-18 MED ORDER — FENTANYL CITRATE 0.05 MG/ML IJ SOLN
50.0000 ug | Freq: Once | INTRAMUSCULAR | Status: AC
  Administered 2012-08-18: 50 ug via INTRAVENOUS
  Filled 2012-08-18: qty 2

## 2012-08-18 MED ORDER — SODIUM CHLORIDE 0.9 % IV BOLUS (SEPSIS)
1000.0000 mL | Freq: Once | INTRAVENOUS | Status: AC
  Administered 2012-08-18: 1000 mL via INTRAVENOUS

## 2012-08-18 MED ORDER — DIPHENHYDRAMINE HCL 50 MG/ML IJ SOLN
50.0000 mg | Freq: Once | INTRAMUSCULAR | Status: AC
  Administered 2012-08-18: 50 mg via INTRAVENOUS
  Filled 2012-08-18: qty 1

## 2012-08-18 NOTE — ED Provider Notes (Signed)
Family Practice to bedside to examine patient do not feel the need to readmitt the patient at this time  Will continue to monitor blood sugar until down to about 200 prior to DC  Patient has been given his normal HS insulin plus additional Humalog.   Discussion with PCP --okay to DC home with follow up   Arman Filter, NP 08/18/12 2210

## 2012-08-18 NOTE — ED Notes (Signed)
Reports discharged from hosp couple days ago for pancreatitis, states pain unchanged since discharge. Is unable to fill any prescriptions, also cannot afford any of his regular meds for his diabetes.

## 2012-08-18 NOTE — ED Provider Notes (Signed)
History    CSN: [id number] Arrival date & time 08/18/12  1759  First MD Initiated Contact with Patient 08/18/12 1818     Chief Complaint  Patient presents with  . Hyperglycemia   (Consider location/radiation/quality/duration/timing/severity/associated sxs/prior Treatment) HPI  This is this patient fourth ED visit in July for chronic pancreatitis. He was just discharged from the hospital 2 days ago for the same. He has taken none of his medications since he was discharged. He states today he had a craving and he went to cookout and got a hamburger, french fries, onion rings, and a ice cream Sunday. He reports he now has upper abdominal pain which is his usual chronic pain and he has been vomiting. He also states his blood sugar shot up this afternoon. He states this is everything that he's had before.   PCP none, [facility]  Past Medical History  Diagnosis Date  . Hypertension   . Pancreatitis   . Alcohol abuse   . Chronic abdominal pain   . Alcoholism /alcohol abuse   . Neuromuscular disorder 07/02/2012    Diabetic neuropathy  . Hypercholesteremia   . Type II diabetes mellitus   . NWGNFAOZ(308.6)     "weekly" (08/13/2012)  . Grand mal     "maybe once/month" (08/13/2012)  . Arthritis     "hands & arms" (08/13/2012)  . Depression    Past Surgical History  Procedure Laterality Date  . Appendectomy  1970's   Family History  Problem Relation Age of Onset  . Hypertension     History  Substance Use Topics  . Smoking status: Former Smoker -- 0.50 packs/day for 8 years    Types: Cigarettes    Quit date: 12/21/1984  . Smokeless tobacco: Never Used  . Alcohol Use: Yes     Comment:  08/13/2012 "detox program; h/o heavy, binge drinking; last drink was 30 days ago"  unemployed Lives alone Last drink a week ago Last cocaine a few days before admitted  Review of Systems  All other systems reviewed and are negative.    Allergies  Morphine and  related  Home Medications   Current Outpatient Rx  Name  Route  Sig  Dispense  Refill  . gabapentin (NEURONTIN) 300 MG capsule   Oral   Take 300 mg by mouth 3 (three) times daily.         . insulin aspart (NOVOLOG) 100 UNIT/ML injection   Subcutaneous   Inject 0-10 Units into the skin See admin instructions. 4 times daily before each meal, 140-199 - 2 units, 200-250 - 4 units, 251-299 - 6 units, 300-349 - 8 units, 350 or above 10 units         . insulin glargine (LANTUS) 100 UNIT/ML injection   Subcutaneous   Inject 30 Units into the skin at bedtime.         . lipase/protease/amylase (CREON-12/PANCREASE) 12000 UNITS CPEP   Oral   Take 2 capsules by mouth 3 (three) times daily.         Marland Kitchen lisinopril (PRINIVIL,ZESTRIL) 10 MG tablet   Oral   Take 10 mg by mouth daily.         . metFORMIN (GLUCOPHAGE) 500 MG tablet   Oral   Take 500 mg by mouth 2 (two) times daily with a meal.         . mirtazapine (REMERON) 15 MG tablet   Oral   Take 15 mg by mouth  at bedtime.         . naproxen (NAPROSYN) 500 MG tablet   Oral   Take 500 mg by mouth 2 (two) times daily with a meal.         . phenytoin (DILANTIN) 100 MG ER capsule   Oral   Take 200 mg by mouth at bedtime.         . simvastatin (ZOCOR) 10 MG tablet   Oral   Take 10 mg by mouth at bedtime.         . traZODone (DESYREL) 100 MG tablet   Oral   Take 100 mg by mouth at bedtime.          BP 121/89  Pulse 113  Temp(Src) 98.6 F (37 C) (Oral)  Resp 18  SpO2 100%  Vital signs normal cardia  Physical Exam  Nursing note and vitals reviewed. Constitutional: He is oriented to person, place, and time. He appears well-developed and well-nourished.  Non-toxic appearance. He does not appear ill. No distress.  HENT:  Head: Normocephalic and atraumatic.  Right Ear: External ear normal.  Left Ear: External ear normal.  Nose: Nose normal. No mucosal edema or rhinorrhea.  Mouth/Throat: Oropharynx is clear  and moist and mucous membranes are normal. No dental abscesses or edematous.  Eyes: Conjunctivae and EOM are normal. Pupils are equal, round, and reactive to light.  Neck: Normal range of motion and full passive range of motion without pain. Neck supple.  Cardiovascular: Normal rate, regular rhythm and normal heart sounds.  Exam reveals no gallop and no friction rub.   No murmur heard. Pulmonary/Chest: Effort normal and breath sounds normal. No respiratory distress. He has no wheezes. He has no rhonchi. He has no rales. He exhibits no tenderness and no crepitus.  Abdominal: Soft. Normal appearance and bowel sounds are normal. He exhibits no distension. There is tenderness. There is no rebound and no guarding.    Musculoskeletal: Normal range of motion. He exhibits no edema and no tenderness.  Moves all extremities well.   Neurological: He is alert and oriented to person, place, and time. He has normal strength. No cranial nerve deficit.  Skin: Skin is warm, dry and intact. No rash noted. No erythema. No pallor.  Psychiatric: He has a normal mood and affect. His speech is normal and behavior is normal. His mood appears not anxious.    ED Course  Procedures (including critical care time)]  Medications  0.9 %  sodium chloride infusion (1,000 mLs Intravenous New Bag/Given 08/18/12 1912)    Followed by  0.9 %  sodium chloride infusion (1,000 mLs Intravenous New Bag/Given 08/18/12 1912)    Followed by  0.9 %  sodium chloride infusion (1,000 mLs Intravenous New Bag/Given 08/18/12 1911)  insulin aspart (novoLOG) injection 8 Units (not administered)  sodium chloride 0.9 % bolus 1,000 mL (1,000 mLs Intravenous New Bag/Given 08/18/12 1824)  metoCLOPramide (REGLAN) injection 10 mg (10 mg Intravenous Given 08/18/12 1913)  diphenhydrAMINE (BENADRYL) injection 50 mg (50 mg Intravenous Given 08/18/12 1913)  fentaNYL (SUBLIMAZE) injection 50 mcg (50 mcg Intravenous Given 08/18/12 1913)  famotidine (PEPCID)  IVPB 20 mg (20 mg Intravenous New Bag/Given 08/18/12 1913)    Pt given IV fluids, IV nausea medications and one dose of pain medications. He was given insulin for his hyperglycemia, nonketotic, nonacidotic. Will discuss with [facility] who just discharged him a couple of days ago   20:08 Dr Mikel Cella, [facility] will come see patient.   Results  for orders placed during the hospital encounter of 08/18/12  URINALYSIS, ROUTINE W REFLEX MICROSCOPIC      Result Value Range   Color, Urine YELLOW  YELLOW   APPearance CLEAR  CLEAR   Specific Gravity, Urine 1.042 (*) 1.005 - 1.030   pH 5.5  5.0 - 8.0   Glucose, UA >1000 (*) NEGATIVE mg/dL   Hgb urine dipstick NEGATIVE  NEGATIVE   Bilirubin Urine NEGATIVE  NEGATIVE   Ketones, ur 15 (*) NEGATIVE mg/dL   Protein, ur NEGATIVE  NEGATIVE mg/dL   Urobilinogen, UA 0.2  0.0 - 1.0 mg/dL   Nitrite NEGATIVE  NEGATIVE   Leukocytes, UA NEGATIVE  NEGATIVE  COMPREHENSIVE METABOLIC PANEL      Result Value Range   Sodium 126 (*) 135 - 145 mEq/L   Potassium 4.2  3.5 - 5.1 mEq/L   Chloride 89 (*) 96 - 112 mEq/L   CO2 27  19 - 32 mEq/L   Glucose, Bld 618 (*) 70 - 99 mg/dL   BUN 10  6 - 23 mg/dL   Creatinine, Ser 1.61  0.50 - 1.35 mg/dL   Calcium 9.3  8.4 - 09.6 mg/dL   Total Protein 7.0  6.0 - 8.3 g/dL   Albumin 3.6  3.5 - 5.2 g/dL   AST 25  0 - 37 U/L   ALT 36  0 - 53 U/L   Alkaline Phosphatase 94  39 - 117 U/L   Total Bilirubin 0.3  0.3 - 1.2 mg/dL   GFR calc non Af Amer >90  >90 mL/min   GFR calc Af Amer >90  >90 mL/min  CBC      Result Value Range   WBC 4.4  4.0 - 10.5 K/uL   RBC 4.41  4.22 - 5.81 MIL/uL   Hemoglobin 13.3  13.0 - 17.0 g/dL   HCT 04.5 (*) 40.9 - 81.1 %   MCV 82.3  78.0 - 100.0 fL   MCH 30.2  26.0 - 34.0 pg   MCHC 36.6 (*) 30.0 - 36.0 g/dL   RDW 91.4  78.2 - 95.6 %   Platelets 174  150 - 400 K/uL  GLUCOSE, CAPILLARY      Result Value Range   Glucose-Capillary >600 (*) 70 - 99 mg/dL  LIPASE, BLOOD      Result Value Range   Lipase 12   11 - 59 U/L  ETHANOL      Result Value Range   Alcohol, Ethyl (B) <11  0 - 11 mg/dL  URINE RAPID DRUG SCREEN (HOSP PERFORMED)      Result Value Range   Opiates NONE DETECTED  NONE DETECTED   Cocaine POSITIVE (*) NONE DETECTED   Benzodiazepines NONE DETECTED  NONE DETECTED   Amphetamines NONE DETECTED  NONE DETECTED   Tetrahydrocannabinol NONE DETECTED  NONE DETECTED   Barbiturates NONE DETECTED  NONE DETECTED  URINE MICROSCOPIC-ADD ON      Result Value Range   Squamous Epithelial / LPF RARE  RARE   WBC, UA 0-2  <3 WBC/hpf   RBC / HPF 0-2  <3 RBC/hpf   Bacteria, UA RARE  RARE   Laboratory interpretation all normal except for hyperglycemia, + Cocaine, concentrated urine c/w dehydration   1. Chronic abdominal pain   2. Cocaine abuse   3. Nausea and vomiting     Disposition per Dr Mikel Cella [facility]   Devoria Albe, MD, FACEP   MDM    Ward Givens, MD 08/18/12 2010

## 2012-08-18 NOTE — ED Notes (Signed)
Pt called EMS for high blood sugar, n/v, abd pain. Per EMS - CBG 542. Pt reports unable to afford any of his diabetes medicine.

## 2012-08-18 NOTE — Consult Note (Signed)
Family Medicine Teaching Service Consult Note Intern Pager: [phone]  Patient name: Gabriel Rush Medical record number: [id number] Date of birth: [birth year] Age: 52 y.o. Gender: male  Primary Care Provider: None  Pt Overview: Patient discharged from hospital 2 days ago. Returned with abdominal pain and hyperglycemia due to large meal and no insulin at home. He is not in DKA, and overall feels much better with IVF and insulin. Overall, patient is stable and CBG is normalizing.  Assessment and Plan:  # Hyperglycemia- Patient states he is not able to get his medications at home due to finances. He has been seen by case management and arranged ways to get medicines, but patient has yet to pick them up.  - No gap, CBG coming down. No acute indication for hospitalization at this time. - s/p 3L fluids and insulin - Give one dose Lantus prior to discharge - Advised again to go to MAP program at health dept - Advised to drink plenty of water and do not over indulge on diet  # Cocaine use- Patient endorses cocaine use after leaving the hospital. He states a "friend gave it to him" when asked why he could afford drugs but not insulin.  # Abdominal pain- Likely secondary to chronic pancreatitis vs. Gastroparesis after large meal. Lipase 12. - Stable/improving. Nothing to do at this time  Disposition: Discharge to home, stable condition. Follow up as directed  Subjective: Patient awake, alert. NAD.  Objective: Temp:  [98.6 F (37 C)] 98.6 F (37 C) (07/12 1806) Pulse Rate:  [113] 113 (07/12 1806) Resp:  [18] 18 (07/12 1806) BP: (121)/(89) 121/89 mmHg (07/12 1806) SpO2:  [99 %-100 %] 100 % (07/12 1806) Physical Exam: General: Lying in bed, NAD Cardiovascular: RRR Respiratory: CTAB Abdomen: +BS. Nontender when distracted, mild diffuse tenderness on palpation. Extremities: Moves all extremities Neuro: Grossly intact  Laboratory:  Recent Labs Lab 08/18/12 1821  WBC 4.4  HGB 13.3   HCT 36.3*  PLT 174    Recent Labs Lab 08/18/12 1821  NA 126*  K 4.2  CL 89*  CO2 27  BUN 10  CREATININE 0.70  CALCIUM 9.3  PROT 7.0  BILITOT 0.3  ALKPHOS 94  ALT 36  AST 25  GLUCOSE 618*    Hailei Besser Nydia Bouton, MD 08/18/2012, 8:35 PM PGY-3, [place] [facility] FPTS Intern pager: [phone], text pages welcome

## 2012-08-19 NOTE — Consult Note (Signed)
FMTS Attending Admission Note: Gabriel Eniola,MD I  have seen and examined this patient, reviewed their chart. I have discussed this patient with the resident. I agree with the resident's findings, assessment and care plan.  

## 2012-08-19 NOTE — ED Provider Notes (Signed)
Pt originally seen by me, disposition done by NP  Lillionna Nabi L Nashaly Dorantes, MD 08/19/12 1102 

## 2012-08-22 ENCOUNTER — Inpatient Hospital Stay: Payer: Self-pay

## 2012-08-22 DIAGNOSIS — Z8679 Personal history of other diseases of the circulatory system: Secondary | ICD-10-CM | POA: Insufficient documentation

## 2012-08-22 DIAGNOSIS — E119 Type 2 diabetes mellitus without complications: Secondary | ICD-10-CM | POA: Insufficient documentation

## 2012-08-22 DIAGNOSIS — Z87891 Personal history of nicotine dependence: Secondary | ICD-10-CM | POA: Insufficient documentation

## 2012-08-22 DIAGNOSIS — Z794 Long term (current) use of insulin: Secondary | ICD-10-CM | POA: Insufficient documentation

## 2012-08-22 DIAGNOSIS — I10 Essential (primary) hypertension: Secondary | ICD-10-CM | POA: Insufficient documentation

## 2012-08-22 DIAGNOSIS — E7800 Pure hypercholesterolemia, unspecified: Secondary | ICD-10-CM | POA: Insufficient documentation

## 2012-08-22 DIAGNOSIS — F1010 Alcohol abuse, uncomplicated: Secondary | ICD-10-CM | POA: Insufficient documentation

## 2012-08-22 DIAGNOSIS — K861 Other chronic pancreatitis: Secondary | ICD-10-CM | POA: Insufficient documentation

## 2012-08-22 DIAGNOSIS — Z8739 Personal history of other diseases of the musculoskeletal system and connective tissue: Secondary | ICD-10-CM | POA: Insufficient documentation

## 2012-08-22 DIAGNOSIS — F3289 Other specified depressive episodes: Secondary | ICD-10-CM | POA: Insufficient documentation

## 2012-08-22 DIAGNOSIS — F329 Major depressive disorder, single episode, unspecified: Secondary | ICD-10-CM | POA: Insufficient documentation

## 2012-08-22 DIAGNOSIS — Z8669 Personal history of other diseases of the nervous system and sense organs: Secondary | ICD-10-CM | POA: Insufficient documentation

## 2012-08-22 DIAGNOSIS — R000 Tachycardia, unspecified: Secondary | ICD-10-CM | POA: Insufficient documentation

## 2012-08-22 DIAGNOSIS — Z79899 Other long term (current) drug therapy: Secondary | ICD-10-CM | POA: Insufficient documentation

## 2012-08-23 ENCOUNTER — Encounter (HOSPITAL_COMMUNITY): Payer: Self-pay | Admitting: Emergency Medicine

## 2012-08-23 ENCOUNTER — Emergency Department (HOSPITAL_COMMUNITY)
Admission: EM | Admit: 2012-08-23 | Discharge: 2012-08-23 | Disposition: A | Discharge status: Home / Self Care | Payer: Self-pay | Attending: Emergency Medicine | Admitting: Emergency Medicine

## 2012-08-23 DIAGNOSIS — K860 Alcohol-induced chronic pancreatitis: Secondary | ICD-10-CM

## 2012-08-23 LAB — COMPREHENSIVE METABOLIC PANEL
AST: 18 U/L (ref 0–37)
Albumin: 3.6 g/dL (ref 3.5–5.2)
BUN: 8 mg/dL (ref 6–23)
Calcium: 9.4 mg/dL (ref 8.4–10.5)
Creatinine, Ser: 0.78 mg/dL (ref 0.50–1.35)
Total Bilirubin: 0.3 mg/dL (ref 0.3–1.2)
Total Protein: 7.3 g/dL (ref 6.0–8.3)

## 2012-08-23 LAB — URINALYSIS, ROUTINE W REFLEX MICROSCOPIC
Bilirubin Urine: NEGATIVE
Glucose, UA: >1000 mg/dL — AB
Hgb urine dipstick: NEGATIVE
Ketones, ur: 15 mg/dL — AB
Leukocytes, UA: NEGATIVE
Nitrite: NEGATIVE
Protein, ur: NEGATIVE mg/dL
Specific Gravity, Urine: <1.005 — ABNORMAL LOW (ref 1.005–1.030)
Urobilinogen, UA: 0.2 mg/dL (ref 0.0–1.0)
pH: 5.5 (ref 5.0–8.0)

## 2012-08-23 LAB — CBC WITH DIFFERENTIAL/PLATELET
Basophils Absolute: 0 10*3/uL (ref 0.0–0.1)
Basophils Relative: 1 % (ref 0–1)
Eosinophils Absolute: 0 10*3/uL (ref 0.0–0.7)
Eosinophils Relative: 1 % (ref 0–5)
HCT: 36.2 % — ABNORMAL LOW (ref 39.0–52.0)
Hemoglobin: 13.4 g/dL (ref 13.0–17.0)
Lymphocytes Relative: 45 % (ref 12–46)
Lymphs Abs: 2 10*3/uL (ref 0.7–4.0)
MCH: 29.9 pg (ref 26.0–34.0)
MCHC: 37 g/dL — ABNORMAL HIGH (ref 30.0–36.0)
MCV: 80.8 fL (ref 78.0–100.0)
Monocytes Absolute: 0.3 10*3/uL (ref 0.1–1.0)
Monocytes Relative: 6 % (ref 3–12)
Neutro Abs: 2.1 10*3/uL (ref 1.7–7.7)
Neutrophils Relative %: 47 % (ref 43–77)
Platelets: 205 10*3/uL (ref 150–400)
RBC: 4.48 MIL/uL (ref 4.22–5.81)
RDW: 11.8 % (ref 11.5–15.5)
WBC: 4.4 10*3/uL (ref 4.0–10.5)

## 2012-08-23 LAB — ETHANOL: Alcohol, Ethyl (B): 143 mg/dL — ABNORMAL HIGH (ref 0–11)

## 2012-08-23 LAB — URINE MICROSCOPIC-ADD ON

## 2012-08-23 LAB — LIPASE, BLOOD: Lipase: 15 U/L (ref 11–59)

## 2012-08-23 MED ORDER — SODIUM CHLORIDE 0.9 % IV BOLUS (SEPSIS)
1000.0000 mL | Freq: Once | INTRAVENOUS | Status: AC
  Administered 2012-08-23: 1000 mL via INTRAVENOUS

## 2012-08-23 MED ORDER — ACETAMINOPHEN 325 MG PO TABS
650.0000 mg | ORAL_TABLET | Freq: Once | ORAL | Status: AC
  Administered 2012-08-23: 650 mg via ORAL
  Filled 2012-08-23: qty 2

## 2012-08-23 MED ORDER — ONDANSETRON HCL 4 MG/2ML IJ SOLN
4.0000 mg | Freq: Once | INTRAMUSCULAR | Status: AC
  Administered 2012-08-23: 4 mg via INTRAVENOUS
  Filled 2012-08-23: qty 2

## 2012-08-23 MED ORDER — PANTOPRAZOLE SODIUM 40 MG IV SOLR
40.0000 mg | Freq: Once | INTRAVENOUS | Status: AC
  Administered 2012-08-23: 40 mg via INTRAVENOUS
  Filled 2012-08-23: qty 40

## 2012-08-23 MED ORDER — INSULIN ASPART 100 UNIT/ML ~~LOC~~ SOLN
10.0000 [IU] | Freq: Once | SUBCUTANEOUS | Status: AC
  Administered 2012-08-23: 10 [IU] via SUBCUTANEOUS
  Filled 2012-08-23: qty 1

## 2012-08-23 NOTE — ED Notes (Signed)
CBG: 276 

## 2012-08-23 NOTE — Progress Notes (Signed)
CSW was contacted to assist with transportation. Pt has been to the ed on multiple occassions within the last several weeks.  CSW explained the importance of Pt arranging emergent transportation in the event he would need to come to the ED for further care. Pt voiced understanding.   No further needs.   Leron Croak, LCSWA [facility] Emergency Dept.  [phone]

## 2012-08-23 NOTE — ED Provider Notes (Signed)
History    CSN: [id number] Arrival date & time 08/22/12  2356  First MD Initiated Contact with Patient 08/23/12 0053     Chief Complaint  Patient presents with  . Abdominal Pain  . Alcohol Problem   (Consider location/radiation/quality/duration/timing/severity/associated sxs/prior Treatment) HPI History provided by the patient. Epigastric and left upper quadrant abdominal pain with history of chronic pancreatitis. Admits to drinking 12 pack of beer today. He has nausea, vomiting and diarrhea. No blood in stools or emesis. Pain is sharp and not radiating. Patient states this feels like his pancreatitis. No fevers or chills. He is prescribed multiple medications but is unable to afford them, currently is not taking anything. He is also diabetic and uncertain what his blood sugar has been.  No chest pain. No shortness of breath. Symptoms moderate to severe.  Past Medical History  Diagnosis Date  . Hypertension   . Pancreatitis   . Alcohol abuse   . Chronic abdominal pain   . Alcoholism /alcohol abuse   . Neuromuscular disorder 07/02/2012    Diabetic neuropathy  . Hypercholesteremia   . Type II diabetes mellitus   . OZDGUYQI(347.4)     "weekly" (08/13/2012)  . Grand mal     "maybe once/month" (08/13/2012)  . Arthritis     "hands & arms" (08/13/2012)  . Depression    Past Surgical History  Procedure Laterality Date  . Appendectomy  1970's   Family History  Problem Relation Age of Onset  . Hypertension     History  Substance Use Topics  . Smoking status: Former Smoker -- 0.50 packs/day for 8 years    Types: Cigarettes    Quit date: 12/21/1984  . Smokeless tobacco: Never Used  . Alcohol Use: Yes     Comment:  08/13/2012 "detox program; h/o heavy, binge drinking; last drink was 30 days ago"    Review of Systems  Constitutional: Negative for fever and chills.  HENT: Negative for neck pain and neck stiffness.   Eyes: Negative for visual disturbance.  Respiratory: Negative for  shortness of breath.   Cardiovascular: Negative for chest pain.  Gastrointestinal: Positive for abdominal pain. Negative for blood in stool.  Genitourinary: Negative for flank pain.  Musculoskeletal: Negative for back pain.  Skin: Negative for rash.  Neurological: Negative for headaches.  All other systems reviewed and are negative.    Allergies  Morphine and related  Home Medications   Current Outpatient Rx  Name  Route  Sig  Dispense  Refill  . gabapentin (NEURONTIN) 300 MG capsule   Oral   Take 300 mg by mouth 3 (three) times daily.         . insulin aspart (NOVOLOG) 100 UNIT/ML injection   Subcutaneous   Inject 0-10 Units into the skin See admin instructions. 4 times daily before each meal, 140-199 - 2 units, 200-250 - 4 units, 251-299 - 6 units, 300-349 - 8 units, 350 or above 10 units         . insulin glargine (LANTUS) 100 UNIT/ML injection   Subcutaneous   Inject 30 Units into the skin at bedtime.         . lipase/protease/amylase (CREON-12/PANCREASE) 12000 UNITS CPEP   Oral   Take 2 capsules by mouth 3 (three) times daily.         Marland Kitchen lisinopril (PRINIVIL,ZESTRIL) 10 MG tablet   Oral   Take 10 mg by mouth daily.         . metFORMIN (GLUCOPHAGE) 500  MG tablet   Oral   Take 500 mg by mouth 2 (two) times daily with a meal.         . mirtazapine (REMERON) 15 MG tablet   Oral   Take 15 mg by mouth at bedtime.         . naproxen (NAPROSYN) 500 MG tablet   Oral   Take 500 mg by mouth 2 (two) times daily with a meal.         . phenytoin (DILANTIN) 100 MG ER capsule   Oral   Take 200 mg by mouth at bedtime.         . simvastatin (ZOCOR) 10 MG tablet   Oral   Take 10 mg by mouth at bedtime.         . traZODone (DESYREL) 100 MG tablet   Oral   Take 100 mg by mouth at bedtime.          BP 117/86  Pulse 111  Temp(Src) 98.4 F (36.9 C)  Resp 16  SpO2 95% Physical Exam  Constitutional: He is oriented to person, place, and time. He  appears well-developed and well-nourished.  HENT:  Head: Normocephalic and atraumatic.  Eyes: EOM are normal. Pupils are equal, round, and reactive to light. No scleral icterus.  Neck: Neck supple.  Cardiovascular: Regular rhythm and intact distal pulses.   Tachycardic  Pulmonary/Chest: Effort normal and breath sounds normal. No respiratory distress.  Abdominal: Soft. Bowel sounds are normal. He exhibits no distension. There is no rebound and no guarding.  Epigastric and LUQ tenderness  Musculoskeletal: Normal range of motion. He exhibits no edema.  Neurological: He is alert and oriented to person, place, and time.  Skin: Skin is warm and dry.    ED Course  Procedures (including critical care time)  Results for orders placed during the hospital encounter of 08/23/12  CBC WITH DIFFERENTIAL      Result Value Range   WBC 4.4  4.0 - 10.5 K/uL   RBC 4.48  4.22 - 5.81 MIL/uL   Hemoglobin 13.4  13.0 - 17.0 g/dL   HCT 96.0 (*) 45.4 - 09.8 %   MCV 80.8  78.0 - 100.0 fL   MCH 29.9  26.0 - 34.0 pg   MCHC 37.0 (*) 30.0 - 36.0 g/dL   RDW 11.9  14.7 - 82.9 %   Platelets 205  150 - 400 K/uL   Neutrophils Relative % 47  43 - 77 %   Neutro Abs 2.1  1.7 - 7.7 K/uL   Lymphocytes Relative 45  12 - 46 %   Lymphs Abs 2.0  0.7 - 4.0 K/uL   Monocytes Relative 6  3 - 12 %   Monocytes Absolute 0.3  0.1 - 1.0 K/uL   Eosinophils Relative 1  0 - 5 %   Eosinophils Absolute 0.0  0.0 - 0.7 K/uL   Basophils Relative 1  0 - 1 %   Basophils Absolute 0.0  0.0 - 0.1 K/uL  COMPREHENSIVE METABOLIC PANEL      Result Value Range   Sodium 129 (*) 135 - 145 mEq/L   Potassium 3.8  3.5 - 5.1 mEq/L   Chloride 90 (*) 96 - 112 mEq/L   CO2 24  19 - 32 mEq/L   Glucose, Bld 457 (*) 70 - 99 mg/dL   BUN 8  6 - 23 mg/dL   Creatinine, Ser 5.62  0.50 - 1.35 mg/dL   Calcium 9.4  8.4 -  10.5 mg/dL   Total Protein 7.3  6.0 - 8.3 g/dL   Albumin 3.6  3.5 - 5.2 g/dL   AST 18  0 - 37 U/L   ALT 31  0 - 53 U/L   Alkaline  Phosphatase 90  39 - 117 U/L   Total Bilirubin 0.3  0.3 - 1.2 mg/dL   GFR calc non Af Amer >90  >90 mL/min   GFR calc Af Amer >90  >90 mL/min  LIPASE, BLOOD      Result Value Range   Lipase 15  11 - 59 U/L  URINALYSIS, ROUTINE W REFLEX MICROSCOPIC      Result Value Range   Color, Urine YELLOW  YELLOW   APPearance CLEAR  CLEAR   Specific Gravity, Urine <1.005 (*) 1.005 - 1.030   pH 5.5  5.0 - 8.0   Glucose, UA >1000 (*) NEGATIVE mg/dL   Hgb urine dipstick NEGATIVE  NEGATIVE   Bilirubin Urine NEGATIVE  NEGATIVE   Ketones, ur 15 (*) NEGATIVE mg/dL   Protein, ur NEGATIVE  NEGATIVE mg/dL   Urobilinogen, UA 0.2  0.0 - 1.0 mg/dL   Nitrite NEGATIVE  NEGATIVE   Leukocytes, UA NEGATIVE  NEGATIVE  ETHANOL      Result Value Range   Alcohol, Ethyl (B) 143 (*) 0 - 11 mg/dL  URINE MICROSCOPIC-ADD ON      Result Value Range   WBC, UA 0-2  <3 WBC/hpf   RBC / HPF 0-2  <3 RBC/hpf    IVFs, protonix  Regular insulin provided  Recheck: Pain improved, no acute abdomen, blood sugar 200s, heart rate normalized, stable for discharge. I encouraged patient to call for followup at the adult care center, to fill his prescriptions and take medications as prescribed. Patient was also encouraged to avoid alcohol. He states understanding return precautions  MDM  Chronic pancreatitis do to alcohol. Presents with abdominal pain and acute intoxication.   Treated with IV fluids and Protonix, insulin for hyperglycemia  Condition improved  Old records, vital signs and nurses notes reviewed and considered  Sunnie Nielsen, MD 08/23/12 [phone]

## 2012-08-23 NOTE — ED Notes (Signed)
PT. REPORT UPPER ABDOMINAL PAIN WITH EMESIS AND DIARRHEA THIS EVENING , DRANK 1/2 CASE BEER TODAY , PT. STATED HISTORY OF PANCREATITIS.

## 2012-08-24 LAB — GLUCOSE, CAPILLARY

## 2012-09-05 ENCOUNTER — Inpatient Hospital Stay (HOSPITAL_COMMUNITY)
Admission: EM | Admit: 2012-09-05 | Discharge: 2012-09-08 | DRG: 637 | Disposition: A | Discharge status: Home / Self Care | Payer: MEDICAID | Attending: Internal Medicine | Admitting: Internal Medicine

## 2012-09-05 ENCOUNTER — Encounter (HOSPITAL_COMMUNITY): Payer: Self-pay

## 2012-09-05 DIAGNOSIS — Z9119 Patient's noncompliance with other medical treatment and regimen: Secondary | ICD-10-CM

## 2012-09-05 DIAGNOSIS — E7800 Pure hypercholesterolemia, unspecified: Secondary | ICD-10-CM | POA: Diagnosis present

## 2012-09-05 DIAGNOSIS — Z794 Long term (current) use of insulin: Secondary | ICD-10-CM

## 2012-09-05 DIAGNOSIS — G40309 Generalized idiopathic epilepsy and epileptic syndromes, not intractable, without status epilepticus: Secondary | ICD-10-CM | POA: Diagnosis present

## 2012-09-05 DIAGNOSIS — E1149 Type 2 diabetes mellitus with other diabetic neurological complication: Secondary | ICD-10-CM | POA: Diagnosis present

## 2012-09-05 DIAGNOSIS — E43 Unspecified severe protein-calorie malnutrition: Secondary | ICD-10-CM | POA: Diagnosis present

## 2012-09-05 DIAGNOSIS — G8929 Other chronic pain: Secondary | ICD-10-CM | POA: Diagnosis present

## 2012-09-05 DIAGNOSIS — I10 Essential (primary) hypertension: Secondary | ICD-10-CM | POA: Diagnosis present

## 2012-09-05 DIAGNOSIS — E876 Hypokalemia: Secondary | ICD-10-CM | POA: Diagnosis present

## 2012-09-05 DIAGNOSIS — F3289 Other specified depressive episodes: Secondary | ICD-10-CM | POA: Diagnosis present

## 2012-09-05 DIAGNOSIS — Z79899 Other long term (current) drug therapy: Secondary | ICD-10-CM

## 2012-09-05 DIAGNOSIS — E785 Hyperlipidemia, unspecified: Secondary | ICD-10-CM | POA: Diagnosis present

## 2012-09-05 DIAGNOSIS — F329 Major depressive disorder, single episode, unspecified: Secondary | ICD-10-CM

## 2012-09-05 DIAGNOSIS — R109 Unspecified abdominal pain: Secondary | ICD-10-CM | POA: Diagnosis present

## 2012-09-05 DIAGNOSIS — Z91199 Patient's noncompliance with other medical treatment and regimen due to unspecified reason: Secondary | ICD-10-CM

## 2012-09-05 DIAGNOSIS — E1310 Other specified diabetes mellitus with ketoacidosis without coma: Principal | ICD-10-CM | POA: Diagnosis present

## 2012-09-05 DIAGNOSIS — IMO0002 Reserved for concepts with insufficient information to code with codable children: Secondary | ICD-10-CM

## 2012-09-05 DIAGNOSIS — Z87891 Personal history of nicotine dependence: Secondary | ICD-10-CM

## 2012-09-05 DIAGNOSIS — E1165 Type 2 diabetes mellitus with hyperglycemia: Secondary | ICD-10-CM

## 2012-09-05 DIAGNOSIS — E871 Hypo-osmolality and hyponatremia: Secondary | ICD-10-CM

## 2012-09-05 DIAGNOSIS — R197 Diarrhea, unspecified: Secondary | ICD-10-CM | POA: Diagnosis present

## 2012-09-05 DIAGNOSIS — F1010 Alcohol abuse, uncomplicated: Secondary | ICD-10-CM | POA: Diagnosis present

## 2012-09-05 DIAGNOSIS — F1410 Cocaine abuse, uncomplicated: Secondary | ICD-10-CM | POA: Diagnosis present

## 2012-09-05 DIAGNOSIS — R569 Unspecified convulsions: Secondary | ICD-10-CM

## 2012-09-05 DIAGNOSIS — H538 Other visual disturbances: Secondary | ICD-10-CM | POA: Diagnosis present

## 2012-09-05 DIAGNOSIS — E782 Mixed hyperlipidemia: Secondary | ICD-10-CM | POA: Diagnosis present

## 2012-09-05 DIAGNOSIS — K861 Other chronic pancreatitis: Secondary | ICD-10-CM | POA: Diagnosis present

## 2012-09-05 DIAGNOSIS — M129 Arthropathy, unspecified: Secondary | ICD-10-CM | POA: Diagnosis present

## 2012-09-05 DIAGNOSIS — E1142 Type 2 diabetes mellitus with diabetic polyneuropathy: Secondary | ICD-10-CM | POA: Diagnosis present

## 2012-09-05 DIAGNOSIS — E1110 Type 2 diabetes mellitus with ketoacidosis without coma: Secondary | ICD-10-CM | POA: Diagnosis present

## 2012-09-05 DIAGNOSIS — E118 Type 2 diabetes mellitus with unspecified complications: Secondary | ICD-10-CM | POA: Diagnosis present

## 2012-09-05 LAB — GLUCOSE, CAPILLARY
Glucose-Capillary: >600 mg/dL (ref 70–99)
Glucose-Capillary: >600 mg/dL (ref 70–99)

## 2012-09-05 MED ORDER — SODIUM CHLORIDE 0.9 % IV BOLUS (SEPSIS)
1000.0000 mL | Freq: Once | INTRAVENOUS | Status: AC
  Administered 2012-09-05: 1000 mL via INTRAVENOUS

## 2012-09-05 MED ORDER — SODIUM CHLORIDE 0.9 % IV BOLUS (SEPSIS): 1000.0000 mL | Freq: Once | INTRAVENOUS | Status: DC

## 2012-09-05 MED ORDER — SODIUM CHLORIDE 0.9 % IV SOLN: INTRAVENOUS | Status: DC

## 2012-09-05 NOTE — ED Notes (Signed)
Pt states that hes very depressed  And has been off his meds for several weeks, doesn't admit to being suicidal, just says he's very depressed.

## 2012-09-06 DIAGNOSIS — K861 Other chronic pancreatitis: Secondary | ICD-10-CM

## 2012-09-06 DIAGNOSIS — E1110 Type 2 diabetes mellitus with ketoacidosis without coma: Secondary | ICD-10-CM

## 2012-09-06 DIAGNOSIS — I10 Essential (primary) hypertension: Secondary | ICD-10-CM | POA: Diagnosis present

## 2012-09-06 DIAGNOSIS — F1410 Cocaine abuse, uncomplicated: Secondary | ICD-10-CM

## 2012-09-06 DIAGNOSIS — E871 Hypo-osmolality and hyponatremia: Secondary | ICD-10-CM | POA: Diagnosis present

## 2012-09-06 DIAGNOSIS — E1165 Type 2 diabetes mellitus with hyperglycemia: Secondary | ICD-10-CM | POA: Diagnosis present

## 2012-09-06 DIAGNOSIS — E785 Hyperlipidemia, unspecified: Secondary | ICD-10-CM

## 2012-09-06 LAB — CBC
Hemoglobin: 13.9 g/dL (ref 13.0–17.0)
MCH: 30 pg (ref 26.0–34.0)
MCH: 30 pg (ref 26.0–34.0)
MCHC: 36.2 g/dL — ABNORMAL HIGH (ref 30.0–36.0)
MCHC: 36.5 g/dL — ABNORMAL HIGH (ref 30.0–36.0)
MCV: 79.9 fL (ref 78.0–100.0)
MCV: 82.1 fL (ref 78.0–100.0)
Platelets: 161 10*3/uL (ref 150–400)
Platelets: 175 10*3/uL (ref 150–400)
RDW: 12 % (ref 11.5–15.5)

## 2012-09-06 LAB — GLUCOSE, CAPILLARY
Glucose-Capillary: 503 mg/dL — ABNORMAL HIGH (ref 70–99)
Glucose-Capillary: 381 mg/dL — ABNORMAL HIGH (ref 70–99)
Glucose-Capillary: 355 mg/dL — ABNORMAL HIGH (ref 70–99)
Glucose-Capillary: 174 mg/dL — ABNORMAL HIGH (ref 70–99)
Glucose-Capillary: 141 mg/dL — ABNORMAL HIGH (ref 70–99)
Glucose-Capillary: 309 mg/dL — ABNORMAL HIGH (ref 70–99)
Glucose-Capillary: 287 mg/dL — ABNORMAL HIGH (ref 70–99)
Glucose-Capillary: 255 mg/dL — ABNORMAL HIGH (ref 70–99)
Glucose-Capillary: 278 mg/dL — ABNORMAL HIGH (ref 70–99)

## 2012-09-06 LAB — BASIC METABOLIC PANEL
BUN: 4 mg/dL — ABNORMAL LOW (ref 6–23)
CO2: 27 mEq/L (ref 19–32)
Calcium: 9.4 mg/dL (ref 8.4–10.5)
Calcium: 9.1 mg/dL (ref 8.4–10.5)
Calcium: 9.2 mg/dL (ref 8.4–10.5)
Creatinine, Ser: 0.73 mg/dL (ref 0.50–1.35)
GFR calc Af Amer: >90 mL/min (ref >90)
GFR calc non Af Amer: >90 mL/min (ref >90)
GFR calc non Af Amer: >90 mL/min (ref >90)
Glucose, Bld: 265 mg/dL — ABNORMAL HIGH (ref 70–99)
Sodium: 136 mEq/L (ref 135–145)
Sodium: 135 mEq/L (ref 135–145)

## 2012-09-06 LAB — URINALYSIS, ROUTINE W REFLEX MICROSCOPIC
Bilirubin Urine: NEGATIVE
Leukocytes, UA: NEGATIVE
Nitrite: NEGATIVE
Specific Gravity, Urine: 1.04 — ABNORMAL HIGH (ref 1.005–1.030)
Urobilinogen, UA: 0.2 mg/dL (ref 0.0–1.0)
pH: 6 (ref 5.0–8.0)

## 2012-09-06 LAB — BLOOD GAS, ARTERIAL
Acid-base deficit: 0.2 mmol/L (ref 0.0–2.0)
Bicarbonate: 23.5 mEq/L (ref 20.0–24.0)
TCO2: 20.9 mmol/L (ref 0–100)
pCO2 arterial: 36.9 mmHg (ref 35.0–45.0)
pH, Arterial: 7.421 (ref 7.350–7.450)
pO2, Arterial: 84.7 mmHg (ref 80.0–100.0)

## 2012-09-06 LAB — RAPID URINE DRUG SCREEN, HOSP PERFORMED
Cocaine: POSITIVE — AB
Opiates: NOT DETECTED
Tetrahydrocannabinol: NOT DETECTED

## 2012-09-06 LAB — COMPREHENSIVE METABOLIC PANEL
ALT: 18 U/L (ref 0–53)
ALT: 20 U/L (ref 0–53)
ALT: 23 U/L (ref 0–53)
AST: 14 U/L (ref 0–37)
AST: 13 U/L (ref 0–37)
Albumin: 3.3 g/dL — ABNORMAL LOW (ref 3.5–5.2)
Albumin: 3.2 g/dL — ABNORMAL LOW (ref 3.5–5.2)
Alkaline Phosphatase: 90 U/L (ref 39–117)
Alkaline Phosphatase: 82 U/L (ref 39–117)
CO2: 24 mEq/L (ref 19–32)
Calcium: 9.2 mg/dL (ref 8.4–10.5)
Calcium: 9.3 mg/dL (ref 8.4–10.5)
Chloride: 100 mEq/L (ref 96–112)
Creatinine, Ser: 0.63 mg/dL (ref 0.50–1.35)
Creatinine, Ser: 0.78 mg/dL (ref 0.50–1.35)
GFR calc Af Amer: >90 mL/min (ref >90)
GFR calc Af Amer: >90 mL/min (ref >90)
GFR calc non Af Amer: >90 mL/min (ref >90)
Glucose, Bld: 889 mg/dL (ref 70–99)
Potassium: 2.7 mEq/L — CL (ref 3.5–5.1)
Sodium: 123 mEq/L — ABNORMAL LOW (ref 135–145)
Total Bilirubin: 0.3 mg/dL (ref 0.3–1.2)
Total Bilirubin: 0.3 mg/dL (ref 0.3–1.2)
Total Protein: 7 g/dL (ref 6.0–8.3)

## 2012-09-06 LAB — CBC WITH DIFFERENTIAL/PLATELET
Basophils Relative: 1 % (ref 0–1)
Eosinophils Absolute: 0 10*3/uL (ref 0.0–0.7)
Eosinophils Relative: 1 % (ref 0–5)
Hemoglobin: 12.8 g/dL — ABNORMAL LOW (ref 13.0–17.0)
Lymphs Abs: 1.6 10*3/uL (ref 0.7–4.0)
MCH: 29.7 pg (ref 26.0–34.0)
MCHC: 36.9 g/dL — ABNORMAL HIGH (ref 30.0–36.0)
MCV: 80.5 fL (ref 78.0–100.0)
Monocytes Relative: 9 % (ref 3–12)
Platelets: 170 10*3/uL (ref 150–400)
RBC: 4.31 MIL/uL (ref 4.22–5.81)

## 2012-09-06 LAB — ETHANOL
Alcohol, Ethyl (B): <11 mg/dL (ref 0–11)
Alcohol, Ethyl (B): <11 mg/dL (ref 0–11)

## 2012-09-06 LAB — URINE MICROSCOPIC-ADD ON: Urine-Other: NONE SEEN

## 2012-09-06 LAB — HEMOGLOBIN A1C
Hgb A1c MFr Bld: 12.4 % — ABNORMAL HIGH (ref <5.7)
Mean Plasma Glucose: 309 mg/dL — ABNORMAL HIGH (ref <117)

## 2012-09-06 LAB — MRSA PCR SCREENING: MRSA by PCR: NEGATIVE

## 2012-09-06 MED ORDER — HYDROCODONE-ACETAMINOPHEN 5-325 MG PO TABS
1.0000 | ORAL_TABLET | ORAL | Status: DC | PRN
  Administered 2012-09-06 – 2012-09-08 (×11): 2 via ORAL
  Filled 2012-09-06 (×11): qty 2

## 2012-09-06 MED ORDER — DEXTROSE-NACL 5-0.45 % IV SOLN
INTRAVENOUS | Status: DC
  Administered 2012-09-06: 07:00:00 via INTRAVENOUS

## 2012-09-06 MED ORDER — INSULIN ASPART 100 UNIT/ML ~~LOC~~ SOLN
0.0000 [IU] | Freq: Three times a day (TID) | SUBCUTANEOUS | Status: DC
  Administered 2012-09-06: 11 [IU] via SUBCUTANEOUS
  Administered 2012-09-06: 8 [IU] via SUBCUTANEOUS
  Administered 2012-09-07: 11 [IU] via SUBCUTANEOUS
  Administered 2012-09-07: 3 [IU] via SUBCUTANEOUS
  Administered 2012-09-07: 15 [IU] via SUBCUTANEOUS
  Administered 2012-09-08: 2 [IU] via SUBCUTANEOUS

## 2012-09-06 MED ORDER — INSULIN REGULAR HUMAN 100 UNIT/ML IJ SOLN
INTRAMUSCULAR | Status: DC
  Administered 2012-09-06: 5.4 [IU]/h via INTRAVENOUS
  Filled 2012-09-06: qty 1

## 2012-09-06 MED ORDER — HYDRALAZINE HCL 20 MG/ML IJ SOLN
10.0000 mg | Freq: Four times a day (QID) | INTRAMUSCULAR | Status: DC | PRN
  Administered 2012-09-06: 10 mg via INTRAVENOUS
  Filled 2012-09-06: qty 0.5

## 2012-09-06 MED ORDER — POTASSIUM CHLORIDE CRYS ER 20 MEQ PO TBCR
40.0000 meq | EXTENDED_RELEASE_TABLET | Freq: Three times a day (TID) | ORAL | Status: AC
  Administered 2012-09-06 (×3): 40 meq via ORAL
  Filled 2012-09-06 (×4): qty 2

## 2012-09-06 MED ORDER — MIRTAZAPINE 15 MG PO TABS
15.0000 mg | ORAL_TABLET | Freq: Every day | ORAL | Status: DC
  Filled 2012-09-06 (×4): qty 1

## 2012-09-06 MED ORDER — INSULIN ASPART 100 UNIT/ML ~~LOC~~ SOLN
0.0000 [IU] | Freq: Every day | SUBCUTANEOUS | Status: DC
  Administered 2012-09-06: 3 [IU] via SUBCUTANEOUS

## 2012-09-06 MED ORDER — ONDANSETRON HCL 4 MG/2ML IJ SOLN: 4.0000 mg | Freq: Four times a day (QID) | INTRAMUSCULAR | Status: DC | PRN

## 2012-09-06 MED ORDER — GLUCERNA SHAKE PO LIQD
237.0000 mL | Freq: Two times a day (BID) | ORAL | Status: DC
  Administered 2012-09-07 – 2012-09-08 (×4): 237 mL via ORAL
  Filled 2012-09-06 (×4): qty 237

## 2012-09-06 MED ORDER — ADULT MULTIVITAMIN W/MINERALS CH
1.0000 | ORAL_TABLET | Freq: Every day | ORAL | Status: DC
  Administered 2012-09-06 – 2012-09-08 (×3): 1 via ORAL
  Filled 2012-09-06 (×3): qty 1

## 2012-09-06 MED ORDER — SODIUM CHLORIDE 0.9 % IJ SOLN
3.0000 mL | Freq: Two times a day (BID) | INTRAMUSCULAR | Status: DC
  Administered 2012-09-06 – 2012-09-07 (×2): 3 mL via INTRAVENOUS

## 2012-09-06 MED ORDER — DEXTROSE 50 % IV SOLN: 25.0000 mL | INTRAVENOUS | Status: DC | PRN

## 2012-09-06 MED ORDER — PHENYTOIN SODIUM EXTENDED 100 MG PO CAPS
200.0000 mg | ORAL_CAPSULE | Freq: Every day | ORAL | Status: DC
  Administered 2012-09-06 – 2012-09-07 (×3): 200 mg via ORAL
  Filled 2012-09-06 (×4): qty 2

## 2012-09-06 MED ORDER — PANCRELIPASE (LIP-PROT-AMYL) 12000-38000 UNITS PO CPEP
2.0000 | ORAL_CAPSULE | Freq: Three times a day (TID) | ORAL | Status: DC
  Administered 2012-09-06 – 2012-09-08 (×9): 2 via ORAL
  Filled 2012-09-06 (×11): qty 2

## 2012-09-06 MED ORDER — INSULIN GLARGINE 100 UNIT/ML ~~LOC~~ SOLN
30.0000 [IU] | Freq: Every day | SUBCUTANEOUS | Status: DC
  Administered 2012-09-06 – 2012-09-07 (×2): 30 [IU] via SUBCUTANEOUS
  Filled 2012-09-06 (×4): qty 0.3

## 2012-09-06 MED ORDER — INSULIN REGULAR BOLUS VIA INFUSION
0.0000 [IU] | Freq: Three times a day (TID) | INTRAVENOUS | Status: DC
  Filled 2012-09-06: qty 10

## 2012-09-06 MED ORDER — ACETAMINOPHEN 650 MG RE SUPP: 650.0000 mg | Freq: Four times a day (QID) | RECTAL | Status: DC | PRN

## 2012-09-06 MED ORDER — POTASSIUM CHLORIDE 10 MEQ/100ML IV SOLN
10.0000 meq | INTRAVENOUS | Status: AC
  Administered 2012-09-06 (×3): 10 meq via INTRAVENOUS
  Filled 2012-09-06 (×3): qty 100

## 2012-09-06 MED ORDER — SIMVASTATIN 10 MG PO TABS
10.0000 mg | ORAL_TABLET | Freq: Every day | ORAL | Status: DC
  Administered 2012-09-06 – 2012-09-07 (×2): 10 mg via ORAL
  Filled 2012-09-06 (×4): qty 1

## 2012-09-06 MED ORDER — ACETAMINOPHEN 325 MG PO TABS: 650.0000 mg | ORAL_TABLET | Freq: Four times a day (QID) | ORAL | Status: DC | PRN

## 2012-09-06 MED ORDER — TRAZODONE HCL 100 MG PO TABS
100.0000 mg | ORAL_TABLET | Freq: Every day | ORAL | Status: DC
  Administered 2012-09-06 – 2012-09-07 (×3): 100 mg via ORAL
  Filled 2012-09-06 (×4): qty 1

## 2012-09-06 MED ORDER — DEXTROSE-NACL 5-0.45 % IV SOLN: INTRAVENOUS | Status: DC

## 2012-09-06 MED ORDER — LISINOPRIL 10 MG PO TABS
10.0000 mg | ORAL_TABLET | Freq: Every day | ORAL | Status: DC
  Administered 2012-09-06 – 2012-09-08 (×3): 10 mg via ORAL
  Filled 2012-09-06 (×3): qty 1

## 2012-09-06 MED ORDER — MORPHINE SULFATE 2 MG/ML IJ SOLN
1.0000 mg | INTRAMUSCULAR | Status: DC | PRN
  Administered 2012-09-07 (×2): 1 mg via INTRAVENOUS
  Filled 2012-09-06 (×2): qty 1

## 2012-09-06 MED ORDER — SODIUM CHLORIDE 0.9 % IV SOLN: INTRAVENOUS | Status: DC

## 2012-09-06 MED ORDER — ONDANSETRON HCL 4 MG PO TABS: 4.0000 mg | ORAL_TABLET | Freq: Four times a day (QID) | ORAL | Status: DC | PRN

## 2012-09-06 MED ORDER — NAPROXEN 500 MG PO TABS
500.0000 mg | ORAL_TABLET | Freq: Two times a day (BID) | ORAL | Status: DC
  Administered 2012-09-06 – 2012-09-08 (×6): 500 mg via ORAL
  Filled 2012-09-06 (×8): qty 1

## 2012-09-06 MED ORDER — GABAPENTIN 300 MG PO CAPS
300.0000 mg | ORAL_CAPSULE | Freq: Three times a day (TID) | ORAL | Status: DC
  Administered 2012-09-06 – 2012-09-08 (×8): 300 mg via ORAL
  Filled 2012-09-06 (×9): qty 1

## 2012-09-06 MED ORDER — SODIUM CHLORIDE 0.9 % IV SOLN
INTRAVENOUS | Status: DC
  Administered 2012-09-06: 01:00:00 via INTRAVENOUS

## 2012-09-06 NOTE — Progress Notes (Signed)
Inpatient Diabetes Program Recommendations  AACE/ADA: New Consensus Statement on Inpatient Glycemic Control (2013)  Target Ranges:  Prepandial:   less than 140 mg/dL      Peak postprandial:   less than 180 mg/dL (1-2 hours)      Critically ill patients:  140 - 180 mg/dL   Inpatient Diabetes Program Recommendations Correction (SSI): may need sensitive scale instead of moderate if meal coverage is added Insulin - Meal Coverage: consider adding Novolog with meals 4 units TID (not to be given unless patient eats 50% or more of meals per Glycemic Control order set) Thank you  Piedad Climes [name], RN,CDE Inpatient Diabetes Coordinator [phone] (team pager)

## 2012-09-06 NOTE — Progress Notes (Signed)
CRITICAL VALUE ALERT  Critical value received: Potassium        Date of notification:  09/06/12  Time of notification:    Critical value read back:yes  Nurse who received alert:  Levon Hedger  MD notified (1st page):  Leodis Rains  Time of first page:  [phone]    MD notified (2nd page):  Time of second page:  Responding MD:  Merdis Delay  Time MD responded:  [phone]

## 2012-09-06 NOTE — H&P (Signed)
[facility] History and Physical  Gabriel Rush ZOX:[id number] DOB: [birth year] DOA: 09/05/2012  Referring physician: ER physician PCP: No PCP Per Patient   Chief Complaint:   HPI:  52 year old male with past medical history of uncontrolled diabetes (on insulin, non compliant with insulin regimen), hypertension, alcohol and drug abuse (cocaine), seizure disorder, hypertension and dyslipidemia who presented to [facility] ED due to high blood sugar. Patient reports he could not afford medication. He does not have complaints of chest pain or shortness of breath or palpitations. He has chronic abdominal pain due to history of chronic pancreatitis. He reports some nausea but no vomiting. No lightheadedness or loss of consciousness. No reports of blood in stool or urine. No fever or chills. No urinary complaints.  In ED, BP was 148/100 and HR 107. O2 saturation was 100% on room air. Patient's blood sugar was found to be more than 600. His AG was 14. BMP revealed sodium of 124. UDS was positive for cocaine and ethanol level was WNL. Patient was started on insulin drip in ED.  Assessment and Plan:  Principal Problem:   DKA (diabetic ketoacidoses) - secondary to noncompliance with insulin regimen - on insulin drip and Normal saline; DKA order set in place; CBG's every 1 hour and BMP every 2 hours per DKA protocol - monitor in stepdown for next 24 hours Active Problems:   Hypertension - continue lisinopril - hydralazine PRN IV for better BP control    Hyponatremia - likely due to dehydration - continue IV fluids - follow up BMP in am   SEIZURE DISORDER - continue dilantin   Chronic pancreatitis - continue pancreatic lipases supplementation    Dyslipidemia - continue statin therapy   Diabetes mellitus type 2, uncontrolled, with complications - A1c 11.2 08/13/2012 indicating poor glycemic control - complications of diabetic neuropathy   Diabetic neuropathy - continue gabapentin   Cocaine  abuse - UDS positive for cocaine on this admission    DEPRESSION - not suicidal at the time of this admission  Manson Passey [facility] [phone]  Review of Systems:  Constitutional: Negative for fever, chills and malaise/fatigue. Negative for diaphoresis.  HENT: Negative for hearing loss, ear pain, nosebleeds, congestion, sore throat, neck pain, tinnitus and ear discharge.   Eyes: Negative for blurred vision, double vision, photophobia, pain, discharge and redness.  Respiratory: Negative for cough, hemoptysis, sputum production, shortness of breath, wheezing and stridor.   Cardiovascular: Negative for chest pain, palpitations, orthopnea, claudication and leg swelling.  Gastrointestinal: Negative for nausea, vomiting and abdominal pain. Negative for heartburn, constipation, blood in stool and melena.  Genitourinary: Negative for dysuria, urgency, frequency, hematuria and flank pain.  Musculoskeletal: Negative for myalgias, back pain, joint pain and falls.  Skin: Negative for itching and rash.  Neurological: Negative for dizziness and weakness. Negative for tingling, tremors, sensory change, speech change, focal weakness, loss of consciousness and headaches.  Endo/Heme/Allergies: Negative for environmental allergies and polydipsia. Does not bruise/bleed easily.  Psychiatric/Behavioral: Negative for suicidal ideas. The patient is not nervous/anxious.      Past Medical History  Diagnosis Date  . Hypertension   . Pancreatitis   . Alcohol abuse   . Chronic abdominal pain   . Alcoholism /alcohol abuse   . Neuromuscular disorder 07/02/2012    Diabetic neuropathy  . Hypercholesteremia   . Type II diabetes mellitus   . WGNFAOZH(086.5)     "weekly" (08/13/2012)  . Grand mal     "maybe once/month" (08/13/2012)  . Arthritis     "  hands & arms" (08/13/2012)  . Depression    Past Surgical History  Procedure Laterality Date  . Appendectomy  1970's   Social History:  reports that he quit smoking about  27 years ago. His smoking use included Cigarettes. He has a 4 pack-year smoking history. He has never used smokeless tobacco. He reports that  drinks alcohol. He reports that he uses illicit drugs (Marijuana, "Crack" cocaine, and Cocaine).  Allergies  Allergen Reactions  . Morphine And Related Itching    Family History  Problem Relation Age of Onset  . Hypertension       Prior to Admission medications   Medication Sig Start Date End Date Taking? Authorizing Provider  gabapentin (NEURONTIN) 300 MG capsule Take 300 mg by mouth 3 (three) times daily.   Yes Historical Provider, MD  insulin aspart (NOVOLOG) 100 UNIT/ML injection Inject 0-10 Units into the skin See admin instructions. 4 times daily before each meal, 140-199 - 2 units, 200-250 - 4 units, 251-299 - 6 units, 300-349 - 8 units, 350 or above 10 units   Yes Historical Provider, MD  insulin glargine (LANTUS) 100 UNIT/ML injection Inject 30 Units into the skin at bedtime.   Yes Historical Provider, MD  lipase/protease/amylase (CREON-12/PANCREASE) 12000 UNITS CPEP Take 2 capsules by mouth 3 (three) times daily.   Yes Historical Provider, MD  lisinopril (PRINIVIL,ZESTRIL) 10 MG tablet Take 10 mg by mouth daily.   Yes Historical Provider, MD  metFORMIN (GLUCOPHAGE) 500 MG tablet Take 500 mg by mouth 2 (two) times daily with a meal.   Yes Historical Provider, MD  mirtazapine (REMERON) 15 MG tablet Take 15 mg by mouth at bedtime.   Yes Historical Provider, MD  naproxen (NAPROSYN) 500 MG tablet Take 500 mg by mouth 2 (two) times daily with a meal.   Yes Historical Provider, MD  phenytoin (DILANTIN) 100 MG ER capsule Take 200 mg by mouth at bedtime.   Yes Historical Provider, MD  simvastatin (ZOCOR) 10 MG tablet Take 10 mg by mouth at bedtime.   Yes Historical Provider, MD  traZODone (DESYREL) 100 MG tablet Take 100 mg by mouth at bedtime.   Yes Historical Provider, MD   Physical Exam: Filed Vitals:   09/05/12 2254  BP: 148/100  Pulse: 107   Resp: 20  SpO2: 100%    Physical Exam  Constitutional: Appears well-developed and well-nourished. No distress.  HENT: Normocephalic. External right and left ear normal. Oropharynx is clear and moist.  Eyes: Conjunctivae and EOM are normal. PERRLA, no scleral icterus.  Neck: Normal ROM. Neck supple. No JVD. No tracheal deviation. No thyromegaly.  CVS: RRR, S1/S2 +, no murmurs, no gallops, no carotid bruit.  Pulmonary: Effort and breath sounds normal, no stridor, rhonchi, wheezes, rales.  Abdominal: Soft. BS +,  no distension, tenderness, rebound or guarding.  Musculoskeletal: Normal range of motion. No edema and no tenderness.  Lymphadenopathy: No lymphadenopathy noted, cervical, inguinal. Neuro: Alert. Normal reflexes, muscle tone coordination. No cranial nerve deficit. Skin: Skin is warm and dry. No rash noted. Not diaphoretic. No erythema. No pallor.  Psychiatric: Normal mood and affect. Behavior, judgment, thought content normal.   Labs on Admission:  Basic Metabolic Panel:  Recent Labs Lab 09/05/12 2335  NA 123*  K 3.6  CL 85*  CO2 24  GLUCOSE 889*  BUN 6  CREATININE 0.78  CALCIUM 9.2   Liver Function Tests:  Recent Labs Lab 09/05/12 2335  AST 17  ALT 23  ALKPHOS 104  BILITOT  0.3  PROT 7.0  ALBUMIN 3.7   No results found for this basename: LIPASE, AMYLASE,  in the last 168 hours No results found for this basename: AMMONIA,  in the last 168 hours CBC:  Recent Labs Lab 09/05/12 2335  WBC 4.4  HGB 13.9  HCT 38.1*  MCV 82.1  PLT 175   Cardiac Enzymes: No results found for this basename: CKTOTAL, CKMB, CKMBINDEX, TROPONINI,  in the last 168 hours BNP: No components found with this basename: POCBNP,  CBG:  Recent Labs Lab 09/05/12 2335 09/05/12 2337  GLUCAP >600* >600*    Radiological Exams on Admission: No results found.   Code Status: Full Family Communication: Pt at bedside Disposition Plan: Admit for further evaluation  Manson Passey,  MD  [facility] Pager [phone]  If 7PM-7AM, please contact night-coverage [email or web address] Password [email or web address] 09/06/2012, 1:01 AM

## 2012-09-06 NOTE — ED Notes (Signed)
Elisabeth Pigeon, MD at bedside.

## 2012-09-06 NOTE — ED Notes (Signed)
Dr Dierdre Highman made aware of critical glucose

## 2012-09-06 NOTE — ED Provider Notes (Signed)
CSN: [id number]     Arrival date & time 09/05/12  2222 History     First MD Initiated Contact with Patient 09/05/12 2314     Chief Complaint  Patient presents with  . Medical Clearance  . Hyperglycemia   (Consider location/radiation/quality/duration/timing/severity/associated sxs/prior Treatment) HPI Hx per PT -  Type I diabetic presents with hyperglycemia, depression related to unable to afford his medications, denies SI/ HI. He has chronic ABD pain unchanged. Symptoms moderate in severity. No fevers. No vomiting. Patient has increased thirst and urination. No Altered mental status.  Past Medical History  Diagnosis Date  . Hypertension   . Pancreatitis   . Alcohol abuse   . Chronic abdominal pain   . Alcoholism /alcohol abuse   . Neuromuscular disorder 07/02/2012    Diabetic neuropathy  . Hypercholesteremia   . Type II diabetes mellitus   . WUJWJXBJ(478.2)     "weekly" (08/13/2012)  . Grand mal     "maybe once/month" (08/13/2012)  . Arthritis     "hands & arms" (08/13/2012)  . Depression    Past Surgical History  Procedure Laterality Date  . Appendectomy  1970's   Family History  Problem Relation Age of Onset  . Hypertension     History  Substance Use Topics  . Smoking status: Former Smoker -- 0.50 packs/day for 8 years    Types: Cigarettes    Quit date: 12/21/1984  . Smokeless tobacco: Never Used  . Alcohol Use: Yes     Comment:  08/13/2012 "detox program; h/o heavy, binge drinking; last drink was 30 days ago"    Review of Systems  Constitutional: Negative for fever and chills.  HENT: Negative for neck pain and neck stiffness.   Eyes: Negative for visual disturbance.  Respiratory: Negative for shortness of breath.   Cardiovascular: Negative for chest pain.  Gastrointestinal: Negative for vomiting and abdominal pain.  Endocrine: Positive for polydipsia and polyphagia.  Genitourinary: Negative for dysuria.  Musculoskeletal: Negative for back pain.  Skin: Negative  for rash.  Neurological: Negative for headaches.  Psychiatric/Behavioral: Positive for sleep disturbance and dysphoric mood. Negative for hallucinations and self-injury.  All other systems reviewed and are negative.    Allergies  Morphine and related  Home Medications   Current Outpatient Rx  Name  Route  Sig  Dispense  Refill  . gabapentin (NEURONTIN) 300 MG capsule   Oral   Take 300 mg by mouth 3 (three) times daily.         . insulin aspart (NOVOLOG) 100 UNIT/ML injection   Subcutaneous   Inject 0-10 Units into the skin See admin instructions. 4 times daily before each meal, 140-199 - 2 units, 200-250 - 4 units, 251-299 - 6 units, 300-349 - 8 units, 350 or above 10 units         . insulin glargine (LANTUS) 100 UNIT/ML injection   Subcutaneous   Inject 30 Units into the skin at bedtime.         . lipase/protease/amylase (CREON-12/PANCREASE) 12000 UNITS CPEP   Oral   Take 2 capsules by mouth 3 (three) times daily.         Marland Kitchen lisinopril (PRINIVIL,ZESTRIL) 10 MG tablet   Oral   Take 10 mg by mouth daily.         . metFORMIN (GLUCOPHAGE) 500 MG tablet   Oral   Take 500 mg by mouth 2 (two) times daily with a meal.         . mirtazapine (  REMERON) 15 MG tablet   Oral   Take 15 mg by mouth at bedtime.         . naproxen (NAPROSYN) 500 MG tablet   Oral   Take 500 mg by mouth 2 (two) times daily with a meal.         . phenytoin (DILANTIN) 100 MG ER capsule   Oral   Take 200 mg by mouth at bedtime.         . simvastatin (ZOCOR) 10 MG tablet   Oral   Take 10 mg by mouth at bedtime.         . traZODone (DESYREL) 100 MG tablet   Oral   Take 100 mg by mouth at bedtime.          BP 148/100  Pulse 107  Resp 20  SpO2 100% Physical Exam  Constitutional: He is oriented to person, place, and time. He appears well-developed and well-nourished.  HENT:  Head: Normocephalic and atraumatic.  Eyes: EOM are normal. Pupils are equal, round, and reactive to  light.  Dry mucous membranes  Neck: Neck supple.  Cardiovascular: Regular rhythm and intact distal pulses.   Mild tachycardia  Pulmonary/Chest: Effort normal. No respiratory distress.  Abdominal: Soft. Bowel sounds are normal. He exhibits no distension. There is no tenderness.  Musculoskeletal: Normal range of motion. He exhibits no edema and no tenderness.  Neurological: He is alert and oriented to person, place, and time.  Skin: Skin is warm and dry.    ED Course   Procedures (including critical care time)  Results for orders placed during the hospital encounter of 09/05/12  CBC      Result Value Range   WBC 4.4  4.0 - 10.5 K/uL   RBC 4.64  4.22 - 5.81 MIL/uL   Hemoglobin 13.9  13.0 - 17.0 g/dL   HCT 16.1 (*) 09.6 - 04.5 %   MCV 82.1  78.0 - 100.0 fL   MCH 30.0  26.0 - 34.0 pg   MCHC 36.5 (*) 30.0 - 36.0 g/dL   RDW 40.9  81.1 - 91.4 %   Platelets 175  150 - 400 K/uL  COMPREHENSIVE METABOLIC PANEL      Result Value Range   Sodium 123 (*) 135 - 145 mEq/L   Potassium 3.6  3.5 - 5.1 mEq/L   Chloride 85 (*) 96 - 112 mEq/L   CO2 24  19 - 32 mEq/L   Glucose, Bld 889 (*) 70 - 99 mg/dL   BUN 6  6 - 23 mg/dL   Creatinine, Ser 7.82  0.50 - 1.35 mg/dL   Calcium 9.2  8.4 - 95.6 mg/dL   Total Protein 7.0  6.0 - 8.3 g/dL   Albumin 3.7  3.5 - 5.2 g/dL   AST 17  0 - 37 U/L   ALT 23  0 - 53 U/L   Alkaline Phosphatase 104  39 - 117 U/L   Total Bilirubin 0.3  0.3 - 1.2 mg/dL   GFR calc non Af Amer >90  >90 mL/min   GFR calc Af Amer >90  >90 mL/min  URINALYSIS, ROUTINE W REFLEX MICROSCOPIC      Result Value Range   Color, Urine YELLOW  YELLOW   APPearance CLEAR  CLEAR   Specific Gravity, Urine 1.040 (*) 1.005 - 1.030   pH 6.0  5.0 - 8.0   Glucose, UA >1000 (*) NEGATIVE mg/dL   Hgb urine dipstick NEGATIVE  NEGATIVE   Bilirubin  Urine NEGATIVE  NEGATIVE   Ketones, ur NEGATIVE  NEGATIVE mg/dL   Protein, ur NEGATIVE  NEGATIVE mg/dL   Urobilinogen, UA 0.2  0.0 - 1.0 mg/dL   Nitrite  NEGATIVE  NEGATIVE   Leukocytes, UA NEGATIVE  NEGATIVE  ETHANOL      Result Value Range   Alcohol, Ethyl (B) <11  0 - 11 mg/dL  URINE RAPID DRUG SCREEN (HOSP PERFORMED)      Result Value Range   Opiates NONE DETECTED  NONE DETECTED   Cocaine POSITIVE (*) NONE DETECTED   Benzodiazepines NONE DETECTED  NONE DETECTED   Amphetamines NONE DETECTED  NONE DETECTED   Tetrahydrocannabinol NONE DETECTED  NONE DETECTED   Barbiturates NONE DETECTED  NONE DETECTED  BLOOD GAS, ARTERIAL      Result Value Range   FIO2 0.21     pH, Arterial 7.421  7.350 - 7.450   pCO2 arterial 36.9  35.0 - 45.0 mmHg   pO2, Arterial 84.7  80.0 - 100.0 mmHg   Bicarbonate 23.5  20.0 - 24.0 mEq/L   TCO2 20.9  0 - 100 mmol/L   Acid-base deficit 0.2  0.0 - 2.0 mmol/L   O2 Saturation 96.7     Patient temperature 98.6     Collection site RIGHT RADIAL     Drawn by [phone]     Sample type ARTERIAL     Allens test (pass/fail) PASS  PASS  GLUCOSE, CAPILLARY      Result Value Range   Glucose-Capillary >600 (*) 70 - 99 mg/dL   Comment 1 Repeat Test     Comment 2 Documented in Chart    GLUCOSE, CAPILLARY      Result Value Range   Glucose-Capillary >600 (*) 70 - 99 mg/dL   Comment 1 Documented in Chart     Comment 2 Confirm Test in Lab     Comment 3 Call MD NNP PA CNM    URINE MICROSCOPIC-ADD ON      Result Value Range   Urine-Other       Value: NO FORMED ELEMENTS SEEN ON URINE MICROSCOPIC EXAMINATION  BASIC METABOLIC PANEL      Result Value Range   Sodium 127 (*) 135 - 145 mEq/L   Potassium 3.5  3.5 - 5.1 mEq/L   Chloride 90 (*) 96 - 112 mEq/L   CO2 26  19 - 32 mEq/L   Glucose, Bld 691 (*) 70 - 99 mg/dL   BUN 6  6 - 23 mg/dL   Creatinine, Ser 1.19  0.50 - 1.35 mg/dL   Calcium 9.4  8.4 - 14.7 mg/dL   GFR calc non Af Amer >90  >90 mL/min   GFR calc Af Amer >90  >90 mL/min  ETHANOL      Result Value Range   Alcohol, Ethyl (B) <11  0 - 11 mg/dL  COMPREHENSIVE METABOLIC PANEL      Result Value Range   Sodium  132 (*) 135 - 145 mEq/L   Potassium 2.7 (*) 3.5 - 5.1 mEq/L   Chloride 95 (*) 96 - 112 mEq/L   CO2 24  19 - 32 mEq/L   Glucose, Bld 507 (*) 70 - 99 mg/dL   BUN 6  6 - 23 mg/dL   Creatinine, Ser 8.29  0.50 - 1.35 mg/dL   Calcium 9.3  8.4 - 56.2 mg/dL   Total Protein 6.5  6.0 - 8.3 g/dL   Albumin 3.3 (*) 3.5 - 5.2 g/dL   AST  14  0 - 37 U/L   ALT 20  0 - 53 U/L   Alkaline Phosphatase 90  39 - 117 U/L   Total Bilirubin 0.3  0.3 - 1.2 mg/dL   GFR calc non Af Amer >90  >90 mL/min   GFR calc Af Amer >90  >90 mL/min  MAGNESIUM      Result Value Range   Magnesium 1.8  1.5 - 2.5 mg/dL  PHOSPHORUS      Result Value Range   Phosphorus 3.0  2.3 - 4.6 mg/dL  CBC WITH DIFFERENTIAL      Result Value Range   WBC 4.2  4.0 - 10.5 K/uL   RBC 4.31  4.22 - 5.81 MIL/uL   Hemoglobin 12.8 (*) 13.0 - 17.0 g/dL   HCT 16.1 (*) 09.6 - 04.5 %   MCV 80.5  78.0 - 100.0 fL   MCH 29.7  26.0 - 34.0 pg   MCHC 36.9 (*) 30.0 - 36.0 g/dL   RDW 40.9  81.1 - 91.4 %   Platelets 170  150 - 400 K/uL   Neutrophils Relative % 52  43 - 77 %   Neutro Abs 2.2  1.7 - 7.7 K/uL   Lymphocytes Relative 38  12 - 46 %   Lymphs Abs 1.6  0.7 - 4.0 K/uL   Monocytes Relative 9  3 - 12 %   Monocytes Absolute 0.4  0.1 - 1.0 K/uL   Eosinophils Relative 1  0 - 5 %   Eosinophils Absolute 0.0  0.0 - 0.7 K/uL   Basophils Relative 1  0 - 1 %   Basophils Absolute 0.0  0.0 - 0.1 K/uL  APTT      Result Value Range   aPTT 22 (*) 24 - 37 seconds  PROTIME-INR      Result Value Range   Prothrombin Time 11.5 (*) 11.6 - 15.2 seconds   INR 0.85  0.00 - 1.49  GLUCOSE, CAPILLARY      Result Value Range   Glucose-Capillary 503 (*) 70 - 99 mg/dL  GLUCOSE, CAPILLARY      Result Value Range   Glucose-Capillary 381 (*) 70 - 99 mg/dL  GLUCOSE, CAPILLARY      Result Value Range   Glucose-Capillary 355 (*) 70 - 99 mg/dL       CRITICAL CARE Performed by: Sunnie Nielsen Total critical care time: 30 Critical care time was exclusive of  separately billable procedures and treating other patients. Critical care was necessary to treat or prevent imminent or life-threatening deterioration. Critical care was time spent personally by me on the following activities: development of treatment plan with patient and/or surrogate as well as nursing, discussions with consultants, evaluation of patient's response to treatment, examination of patient, obtaining history from patient or surrogate, ordering and performing treatments and interventions, ordering and review of laboratory studies, ordering and review of radiographic studies, pulse oximetry and re-evaluation of patient's condition.  12:57 AM d/w MED Dr Elisabeth Pigeon to admit SDU, cont IVFs and insulin IV drip MDM  Hyperglycemia requiring IV insulin drip, IV fluids, and admit step down unit Labs obtained and reviewed as above no acidosis Or clinical DKA Cocaine positive urine drug screen. Medicine consult and admit  Sunnie Nielsen, MD 09/06/12 [phone]

## 2012-09-06 NOTE — Progress Notes (Addendum)
INITIAL NUTRITION ASSESSMENT  Pt meets criteria for severe MALNUTRITION in the context of social/environmental circumstances as evidenced by <75% estimated energy intake in the past month with 11.7% weight loss in the past month.  DOCUMENTATION CODES Per approved criteria  -Severe malnutrition in the context of social/environmental circumstances   INTERVENTION: - Glucerna shakes BID for additional protein and calories - Assisted pt with ordering dinner - Multivitamin 1 tablet PO daily - Will continue to monitor   NUTRITION DIAGNOSIS: Unintended weight loss related to poor appetite, drug abuse as evidenced by pt report, weight trend.   Goal: Pt to consume >90% of meals/supplements.   Monitor:  Weights, labs, intake   Reason for Assessment: Nutrition risk   52 y.o. male  Admitting Dx: DKA (diabetic ketoacidoses)  ASSESSMENT: Pt with history of uncontrolled diabetes (on insulin, non compliant with insulin regimen), hypertension, alcohol and drug abuse (cocaine), seizure disorder, hypertension and dyslipidemia who presented to [facility] ED due to high blood sugar.   Met with pt who reports eating only 1 meal/day PTA and states he has lost 30 pounds unintentionally in the past few months. Reports blood sugars were up and down PTA due to pt not being able to afford his insulin. States he has been eating 100% of meals since admission. Pt positive for cocaine this admission. Hard for pt to focus on questions being asked. Pt with low potassium. Blood alcohol level not elevated.   Lab Results  Component Value Date   HGBA1C 12.4* 09/06/2012     Height: Ht Readings from Last 1 Encounters:  09/06/12 5\' 11"  (1.803 m)    Weight: Wt Readings from Last 1 Encounters:  09/06/12 159 lb 6.3 oz (72.3 kg)    Ideal Body Weight: 172 lb  % Ideal Body Weight: 92%  Wt Readings from Last 10 Encounters:  09/06/12 159 lb 6.3 oz (72.3 kg)  08/14/12 175 lb 14.8 oz (79.8 kg)  07/25/12 180 lb (81.647  kg)  07/20/12 180 lb (81.647 kg)  07/02/12 178 lb (80.74 kg)  07/01/12 158 lb (71.668 kg)  06/11/12 165 lb 12.6 oz (75.2 kg)  05/29/12 158 lb 11.7 oz (72 kg)  10/23/11 205 lb 7.5 oz (93.2 kg)  06/04/11 218 lb 7.6 oz (99.1 kg)    Usual Body Weight: 189 lb per pt  % Usual Body Weight: 84%  BMI:  Body mass index is 22.24 kg/(m^2).  Estimated Nutritional Needs: Kcal: 1800-2100 Protein: 85-110g Fluid: 1.8-2.1L/day   Skin: Intact   Diet Order: Carb Control  EDUCATION NEEDS: -No education needs identified at this time   Intake/Output Summary (Last 24 hours) at 09/06/12 1635 Last data filed at 09/06/12 1000  Gross per 24 hour  Intake 635.83 ml  Output   1725 ml  Net -1089.17 ml    Last BM: 7/30  Labs:   Recent Labs Lab 09/06/12 0230 09/06/12 0515 09/06/12 0705 09/06/12 1315  NA 132* 135 136 135  K 2.7* 2.7* 2.8* 3.0*  CL 95* 100 100 99  CO2 24 24 25 27   BUN 6 4* 4* 4*  CREATININE 0.63 0.62 0.59 0.68  CALCIUM 9.3 9.2 9.1 9.2  MG 1.8  --   --   --   PHOS 3.0  --  3.0  --   GLUCOSE 507* 257* 170* 265*    CBG (last 3)   Recent Labs  09/06/12 1147 09/06/12 1232 09/06/12 1505  GLUCAP 320* 287* 255*    Scheduled Meds: . gabapentin  300  mg Oral TID  . insulin aspart  0-15 Units Subcutaneous TID WC  . insulin aspart  0-5 Units Subcutaneous QHS  . insulin glargine  30 Units Subcutaneous Daily  . lipase/protease/amylase  2 capsule Oral TID WC  . lisinopril  10 mg Oral Daily  . mirtazapine  15 mg Oral QHS  . naproxen  500 mg Oral BID WC  . phenytoin  200 mg Oral QHS  . potassium chloride  40 mEq Oral TID  . simvastatin  10 mg Oral QHS  . sodium chloride  3 mL Intravenous Q12H  . traZODone  100 mg Oral QHS    Continuous Infusions: . dextrose 5 % and 0.45% NaCl 50 mL/hr at 09/06/12 0717  . insulin (NOVOLIN-R) infusion Stopped (09/06/12 1100)    Past Medical History  Diagnosis Date  . Hypertension   . Pancreatitis   . Alcohol abuse   . Chronic  abdominal pain   . Alcoholism /alcohol abuse   . Neuromuscular disorder 07/02/2012    Diabetic neuropathy  . Hypercholesteremia   . Type II diabetes mellitus   . JYNWGNFA(213.0)     "weekly" (08/13/2012)  . Grand mal     "maybe once/month" (08/13/2012)  . Arthritis     "hands & arms" (08/13/2012)  . Depression     Past Surgical History  Procedure Laterality Date  . Appendectomy  1970's    Levon Hedger MS, RD, LDN [phone] Pager [phone] After Hours Pager

## 2012-09-06 NOTE — Progress Notes (Signed)
[facility] PROGRESS NOTE  Gabriel Rush ZOX:[id number] DOB: 1960-03-23 DOA: 09/05/2012 PCP: No PCP Per Patient  Assessment/Plan  DKA (diabetic ketoacidoses), resolved.    Secondary to noncompliance with insulin regimen   - Transition to subcutaneous insulin this morning as gap is closed and bicarb > 20 -  Lantus 30 units and mod dose SSI -  Turn of insulin gtt and IVF 2 hours after the lantus is administered   Diabetes mellitus type 2, uncontrolled, with complications  - A1c 11.2 08/13/2012 indicating poor glycemic control   Diabetic neuropathy  - continue gabapentin   Active Problems:  Hypertension BP well controled. - continue lisinopril  - hydralazine PRN IV  Hyponatremia artifact from hyperglycemia and resolved. Hypokalemia, likely from wasting from DKA.  Replete with IV and oral potassium. -  Magnesium wnl  SEIZURE DISORDER seizure free.  Continue dilantin   Chronic pancreatitis stable.  Continue pancreatic lipases supplementation  Dyslipidemia , stable.  continue statin therapy   Cocaine abuse  UDS positive for cocaine on this admission  -  SW consult  DEPRESSION stable.  Not suicidal currently.    Social:  Nearly homeless and unable to afford medications.  Medicaid pending per the chart.  SW and CM to assist with medications, housing and establishment of PCP if not already done.  Anticipate d/c tomorrow  Diet:  diabetic Access:  PIV IVF:  Turn off when insulin gtt off Proph:  lovenox  Code Status: full Family Communication: spoke with patient alone Disposition Plan: likely home tomorrow if stable on subcut insulin   Consultants:  none  Procedures:  none  Antibiotics:  none   HPI/Subjective:  Generalized aches and pains and blurry vision.  Also has some nausea without vomiting.    Objective: Filed Vitals:   09/06/12 0126 09/06/12 0150 09/06/12 0425 09/06/12 0535  BP: 143/105 152/99 132/101 107/79  Pulse: 93 101 102 106  Temp: 98.8 F  (37.1 C)  97.8 F (36.6 C)   TempSrc: Oral  Oral   Resp: 21 13 15    Height:  5\' 11"  (1.803 m)    Weight:  72.3 kg (159 lb 6.3 oz)    SpO2: 99% 100% 100%     Intake/Output Summary (Last 24 hours) at 09/06/12 0732 Last data filed at 09/06/12 0600  Gross per 24 hour  Intake    500 ml  Output   1725 ml  Net  -1225 ml   Filed Weights   09/06/12 0150  Weight: 72.3 kg (159 lb 6.3 oz)    Exam:   General:  AAM,  No acute distress  HEENT:  NCAT, MMM  Cardiovascular:  RRR, nl S1, S2 no mrg, 2+ pulses, warm extremities  Respiratory:  CTAB, no increased WOB  Abdomen:   NABS, soft, mildly TTP diffusely without rebound or guarding, ND  MSK:   Normal tone and bulk, no LEE  Neuro:  Grossly intact  Data Reviewed: Basic Metabolic Panel:  Recent Labs Lab 09/05/12 2335 09/06/12 0115 09/06/12 0230 09/06/12 0515  NA 123* 127* 132* 135  K 3.6 3.5 2.7* 2.7*  CL 85* 90* 95* 100  CO2 24 26 24 24   GLUCOSE 889* 691* 507* 257*  BUN 6 6 6  4*  CREATININE 0.78 0.73 0.63 0.62  CALCIUM 9.2 9.4 9.3 9.2  MG  --   --  1.8  --   PHOS  --   --  3.0  --    Liver Function Tests:  Recent Labs Lab  09/05/12 2335 09/06/12 0230 09/06/12 0515  AST 17 14 13   ALT 23 20 18   ALKPHOS 104 90 82  BILITOT 0.3 0.3 0.3  PROT 7.0 6.5 6.3  ALBUMIN 3.7 3.3* 3.2*   No results found for this basename: LIPASE, AMYLASE,  in the last 168 hours No results found for this basename: AMMONIA,  in the last 168 hours CBC:  Recent Labs Lab 09/05/12 2335 09/06/12 0230 09/06/12 0515  WBC 4.4 4.2 3.9*  NEUTROABS  --  2.2  --   HGB 13.9 12.8* 13.0  HCT 38.1* 34.7* 34.6*  MCV 82.1 80.5 79.9  PLT 175 170 161   Cardiac Enzymes: No results found for this basename: CKTOTAL, CKMB, CKMBINDEX, TROPONINI,  in the last 168 hours BNP (last 3 results) No results found for this basename: PROBNP,  in the last 8760 hours CBG:  Recent Labs Lab 09/06/12 0316 09/06/12 0416 09/06/12 0523 09/06/12 0621  09/06/12 0720  GLUCAP 381* 355* 233* 174* 151*    Recent Results (from the past 240 hour(s))  MRSA PCR SCREENING     Status: None   Collection Time    09/06/12  1:51 AM      Result Value Range Status   MRSA by PCR NEGATIVE  NEGATIVE Final   Comment:            The GeneXpert MRSA Assay (FDA     approved for NASAL specimens     only), is one component of a     comprehensive MRSA colonization     surveillance program. It is not     intended to diagnose MRSA     infection nor to guide or     monitor treatment for     MRSA infections.     Studies: No results found.  Scheduled Meds: . gabapentin  300 mg Oral TID  . insulin regular  0-10 Units Intravenous TID WC  . lipase/protease/amylase  2 capsule Oral TID WC  . lisinopril  10 mg Oral Daily  . mirtazapine  15 mg Oral QHS  . naproxen  500 mg Oral BID WC  . phenytoin  200 mg Oral QHS  . potassium chloride  10 mEq Intravenous Q1 Hr x 4  . simvastatin  10 mg Oral QHS  . sodium chloride  3 mL Intravenous Q12H  . traZODone  100 mg Oral QHS   Continuous Infusions: . sodium chloride 100 mL/hr at 09/06/12 0125  . dextrose 5 % and 0.45% NaCl 50 mL/hr at 09/06/12 0717  . insulin (NOVOLIN-R) infusion 3.2 Units/hr (09/06/12 0318)    Principal Problem:   DKA (diabetic ketoacidoses) Active Problems:   DEPRESSION   SEIZURE DISORDER   Chronic pancreatitis   Cocaine abuse   Noncompliance with treatment   Alcohol abuse   Dyslipidemia   Diabetes mellitus type 2, uncontrolled, with complications   HTN (hypertension)   Hyponatremia    Time spent: 30 min    Ksenia Kunz  [facility] Pager [phone]. If 7PM-7AM, please contact night-coverage at [email or web address], password [facility] 09/06/2012, 7:32 AM  LOS: 1 day

## 2012-09-06 NOTE — Progress Notes (Signed)
CARE MANAGEMENT NOTE 09/06/2012  Patient:  Gabriel Rush, Gabriel Rush   Account Number:  [id number]  Date Initiated:  09/06/2012  Documentation initiated by:  Jawaan Adachi  Subjective/Objective Assessment:   pt with hx of diabetes, poor compliance, substance abuse.     Action/Plan:   pcp, f/u care- community health adult clinic   Anticipated DC Date:  09/09/2012   Anticipated DC Plan:  HOME/SELF CARE  In-house referral  PCP / [facility]      DC Planning Services  CM consult      [facility] Choice  NA   Choice offered to / List presented to:  NA   DME arranged  NA      DME agency  NA     HH arranged  NA      HH agency  NA   Status of service:  In process, will continue to follow Medicare Important Message given?  NA - LOS <3 / Initial given by admissions (If response is "NO", the following Medicare IM given date fields will be blank) Date Medicare IM given:   Date Additional Medicare IM given:    Discharge Disposition:    Per UR Regulation:  Reviewed for med. necessity/level of care/duration of stay  If discussed at Long Length of Stay Meetings, dates discussed:    Comments:  [id number]/Darrius Montano Earlene Plater RN, [name], CCN: [phone] Case management. Chart reviewed for discharge planning and present needs. Discharge needs: Referral and information concerning the adult community care clinic. Next chart review due:  [id number] At discharge please give patient this information: [facility] [address] [place], [place] [address] Hours of Operation Mon - Fri: 9 a.m. - 6 p.m. Main: [phone]

## 2012-09-07 DIAGNOSIS — F329 Major depressive disorder, single episode, unspecified: Secondary | ICD-10-CM

## 2012-09-07 DIAGNOSIS — F1010 Alcohol abuse, uncomplicated: Secondary | ICD-10-CM

## 2012-09-07 DIAGNOSIS — E43 Unspecified severe protein-calorie malnutrition: Secondary | ICD-10-CM | POA: Insufficient documentation

## 2012-09-07 LAB — BASIC METABOLIC PANEL
Calcium: 9.1 mg/dL (ref 8.4–10.5)
GFR calc Af Amer: >90 mL/min (ref >90)
GFR calc non Af Amer: >90 mL/min (ref >90)
Glucose, Bld: 440 mg/dL — ABNORMAL HIGH (ref 70–99)
Potassium: 3.7 mEq/L (ref 3.5–5.1)
Sodium: 133 mEq/L — ABNORMAL LOW (ref 135–145)

## 2012-09-07 LAB — GLUCOSE, CAPILLARY
Glucose-Capillary: 257 mg/dL — ABNORMAL HIGH (ref 70–99)
Glucose-Capillary: 344 mg/dL — ABNORMAL HIGH (ref 70–99)
Glucose-Capillary: 371 mg/dL — ABNORMAL HIGH (ref 70–99)
Glucose-Capillary: 168 mg/dL — ABNORMAL HIGH (ref 70–99)

## 2012-09-07 MED ORDER — INSULIN ASPART 100 UNIT/ML ~~LOC~~ SOLN
6.0000 [IU] | Freq: Three times a day (TID) | SUBCUTANEOUS | Status: DC
  Administered 2012-09-07 – 2012-09-08 (×2): 6 [IU] via SUBCUTANEOUS

## 2012-09-07 MED ORDER — SODIUM CHLORIDE 0.9 % IV SOLN
INTRAVENOUS | Status: DC
  Administered 2012-09-07: 20:00:00 via INTRAVENOUS

## 2012-09-07 MED ORDER — INSULIN GLARGINE 100 UNIT/ML ~~LOC~~ SOLN
20.0000 [IU] | Freq: Every day | SUBCUTANEOUS | Status: DC
  Administered 2012-09-07: 20 [IU] via SUBCUTANEOUS
  Filled 2012-09-07: qty 0.2

## 2012-09-07 MED ORDER — INSULIN GLARGINE 100 UNIT/ML ~~LOC~~ SOLN
10.0000 [IU] | Freq: Every day | SUBCUTANEOUS | Status: DC
  Filled 2012-09-07: qty 0.1

## 2012-09-07 MED ORDER — INSULIN ASPART 100 UNIT/ML ~~LOC~~ SOLN
4.0000 [IU] | Freq: Three times a day (TID) | SUBCUTANEOUS | Status: DC
  Administered 2012-09-07 (×2): 4 [IU] via SUBCUTANEOUS

## 2012-09-07 NOTE — Clinical Documentation Improvement (Signed)
THIS DOCUMENT IS NOT A PERMANENT PART OF THE MEDICAL RECORD  Please update your documentation with the medical record to reflect your response to this query. If you need help knowing how to do this please call [phone].  09/07/12   Dear Dr. Thea Silversmith / Associates,  In a better effort to capture your patient's severity of illness, reflect appropriate length of stay and utilization of resources, a review of the patient medical record has revealed: severe malnutrition in Nutritional Consult.   Severe malnutrition in context of social and environmental circumstances  11.7% weight loss in 1 month with DKA, drug abuse, poor appetite; BMI  22.3  Nutritional Recommendations of: Glucerna BID  Based on your clinical judgment, please clarify and document in progress note and discharge summary the clinical condition associated with the above data, including severity of.  _______Mild Malnutrition  _______Moderate Malnutrition _______Severe Malnutrition   _______Protein Calorie Malnutrition ____x___Severe Protein Calorie Malnutrition _______Other Condition________________ _______Cannot clinically determine     In responding to this query please exercise your independent judgment.  The fact that a query is asked, does not imply that any particular answer is desired or expected.    Reviewed: additional documentation in the medical record  Thank Lucilla Edin  Clinical Documentation Specialist: [phone] [facility] [place]

## 2012-09-07 NOTE — Progress Notes (Signed)
Pt is not eligible for assistance with his medications because he has met his limit until May 2015. I explained this to pt and he verbalized understanding. He had an appointment at the [facility] for 7/16 that he missed, I have asked him to call and reschedule this appointment.  Algernon Huxley RN [name]  [phone]

## 2012-09-07 NOTE — Progress Notes (Signed)
[facility] PROGRESS NOTE  Gabriel Rush ZOX:[id number] DOB: 12-22-1960 DOA: 09/05/2012 PCP: No PCP Per Patient  Assessment/Plan  DKA (diabetic ketoacidoses), resolved.    Secondary to noncompliance with insulin regimen     Diabetes mellitus type 2, uncontrolled, with complications  - A1c 11.2 08/13/2012 indicating poor glycemic control  -  Increase lantus 30units QM and add 20 units qPM -  Increase meal time coverage to 6units AC with mod dose SSI  Diarrhea -  Rule out c.diff  Diabetic neuropathy  - continue gabapentin   Active Problems:  Hypertension BP well controled. - continue lisinopril  - hydralazine PRN IV  Hyponatremia artifact from hyperglycemia and resolved. Hypokalemia, likely from wasting from DKA.  Replete with IV and oral potassium. -  Magnesium wnl  SEIZURE DISORDER seizure free.  Continue dilantin   Chronic pancreatitis stable.  Continue pancreatic lipases supplementation   Dyslipidemia , stable.  continue statin therapy   Cocaine abuse  UDS positive for cocaine on this admission  -  SW consult  DEPRESSION stable.  Not suicidal currently.    Social:  Nearly homeless and unable to afford medications.  Medicaid pending per the chart.  SW and CM to assist with medications, housing and establishment of PCP if not already done.  Anticipate d/c tomorrow  Diet:  diabetic Access:  PIV IVF:  Turn off when insulin gtt off Proph:  lovenox  Code Status: full Family Communication: spoke with patient alone Disposition Plan: likely home tomorrow if FS improved on 70/30 insulin   Consultants:  none  Procedures:  none  Antibiotics:  none   HPI/Subjective:  Generalized aches and pains and blurry vision.  Having frequent dark watery diarrhea with near stool incontinence.  Objective: Filed Vitals:   09/06/12 2000 09/06/12 2130 09/07/12 0535 09/07/12 1412  BP: 92/73 105/74 115/83 122/86  Pulse: 94 90 88 96  Temp:  98 F (36.7 C) 98.3 F  (36.8 C) 98.3 F (36.8 C)  TempSrc:  Oral Oral Oral  Resp: 12 18 16 18   Height:      Weight:   72.4 kg (159 lb 9.8 oz)   SpO2: 100% 100% 100% 100%    Intake/Output Summary (Last 24 hours) at 09/07/12 1504 Last data filed at 09/07/12 0946  Gross per 24 hour  Intake   1420 ml  Output      0 ml  Net   1420 ml   Filed Weights   09/06/12 0150 09/07/12 0535  Weight: 72.3 kg (159 lb 6.3 oz) 72.4 kg (159 lb 9.8 oz)    Exam:   General:  AAM,  No acute distress  HEENT:  NCAT, MMM  Cardiovascular:  RRR, nl S1, S2 no mrg, 2+ pulses, warm extremities  Respiratory:  CTAB, no increased WOB  Abdomen:   soft, hyperactive BS with mild TTP diffusely without rebound or guarding, ND  MSK:   Normal tone and bulk, no LEE  Neuro:  Grossly intact  Data Reviewed: Basic Metabolic Panel:  Recent Labs Lab 09/06/12 0230 09/06/12 0515 09/06/12 0705 09/06/12 1315 09/07/12 0510  NA 132* 135 136 135 133*  K 2.7* 2.7* 2.8* 3.0* 3.7  CL 95* 100 100 99 100  CO2 24 24 25 27 24   GLUCOSE 507* 257* 170* 265* 440*  BUN 6 4* 4* 4* 6  CREATININE 0.63 0.62 0.59 0.68 0.70  CALCIUM 9.3 9.2 9.1 9.2 9.1  MG 1.8  --   --   --   --  PHOS 3.0  --  3.0  --   --    Liver Function Tests:  Recent Labs Lab 09/05/12 2335 09/06/12 0230 09/06/12 0515  AST 17 14 13   ALT 23 20 18   ALKPHOS 104 90 82  BILITOT 0.3 0.3 0.3  PROT 7.0 6.5 6.3  ALBUMIN 3.7 3.3* 3.2*   No results found for this basename: LIPASE, AMYLASE,  in the last 168 hours No results found for this basename: AMMONIA,  in the last 168 hours CBC:  Recent Labs Lab 09/05/12 2335 09/06/12 0230 09/06/12 0515  WBC 4.4 4.2 3.9*  NEUTROABS  --  2.2  --   HGB 13.9 12.8* 13.0  HCT 38.1* 34.7* 34.6*  MCV 82.1 80.5 79.9  PLT 175 170 161   Cardiac Enzymes: No results found for this basename: CKTOTAL, CKMB, CKMBINDEX, TROPONINI,  in the last 168 hours BNP (last 3 results) No results found for this basename: PROBNP,  in the last 8760  hours CBG:  Recent Labs Lab 09/06/12 1232 09/06/12 1505 09/06/12 1728 09/06/12 2151 09/07/12 0810  GLUCAP 287* 255* 257* 278* 344*    Recent Results (from the past 240 hour(s))  MRSA PCR SCREENING     Status: None   Collection Time    09/06/12  1:51 AM      Result Value Range Status   MRSA by PCR NEGATIVE  NEGATIVE Final   Comment:            The GeneXpert MRSA Assay (FDA     approved for NASAL specimens     only), is one component of a     comprehensive MRSA colonization     surveillance program. It is not     intended to diagnose MRSA     infection nor to guide or     monitor treatment for     MRSA infections.     Studies: No results found.  Scheduled Meds: . feeding supplement  237 mL Oral BID BM  . gabapentin  300 mg Oral TID  . insulin aspart  0-15 Units Subcutaneous TID WC  . insulin aspart  0-5 Units Subcutaneous QHS  . insulin aspart  6 Units Subcutaneous TID WC  . insulin glargine  20 Units Subcutaneous QHS  . insulin glargine  30 Units Subcutaneous Daily  . lipase/protease/amylase  2 capsule Oral TID WC  . lisinopril  10 mg Oral Daily  . mirtazapine  15 mg Oral QHS  . multivitamin with minerals  1 tablet Oral Daily  . naproxen  500 mg Oral BID WC  . phenytoin  200 mg Oral QHS  . simvastatin  10 mg Oral QHS  . sodium chloride  3 mL Intravenous Q12H  . traZODone  100 mg Oral QHS   Continuous Infusions: . dextrose 5 % and 0.45% NaCl 50 mL/hr at 09/06/12 2956    Principal Problem:   DKA (diabetic ketoacidoses) Active Problems:   DEPRESSION   SEIZURE DISORDER   Chronic pancreatitis   Cocaine abuse   Noncompliance with treatment   Alcohol abuse   Dyslipidemia   Diabetes mellitus type 2, uncontrolled, with complications   HTN (hypertension)   Hyponatremia   Protein-calorie malnutrition, severe    Time spent: 30 min    Caroljean Monsivais  [facility] Pager [phone]. If 7PM-7AM, please contact night-coverage at [email or web address],  password [facility] 09/07/2012, 3:04 PM  LOS: 2 days

## 2012-09-08 LAB — BASIC METABOLIC PANEL
CO2: 24 mEq/L (ref 19–32)
Calcium: 8.3 mg/dL — ABNORMAL LOW (ref 8.4–10.5)
Creatinine, Ser: 0.66 mg/dL (ref 0.50–1.35)
GFR calc non Af Amer: >90 mL/min (ref >90)

## 2012-09-08 LAB — GLUCOSE, CAPILLARY
Glucose-Capillary: 196 mg/dL — ABNORMAL HIGH (ref 70–99)
Glucose-Capillary: 199 mg/dL — ABNORMAL HIGH (ref 70–99)
Glucose-Capillary: 114 mg/dL — ABNORMAL HIGH (ref 70–99)

## 2012-09-08 MED ORDER — INSULIN ASPART PROT & ASPART (70-30 MIX) 100 UNIT/ML ~~LOC~~ SUSP
40.0000 [IU] | Freq: Two times a day (BID) | SUBCUTANEOUS | Status: DC
  Administered 2012-09-08 (×2): 40 [IU] via SUBCUTANEOUS
  Filled 2012-09-08: qty 10

## 2012-09-08 MED ORDER — ADULT MULTIVITAMIN W/MINERALS CH: 1.0000 | ORAL_TABLET | Freq: Every day | ORAL | Status: DC

## 2012-09-08 MED ORDER — "INSULIN SYRINGE 30G X 1/2"" 0.5 ML MISC": Status: DC

## 2012-09-08 MED ORDER — INSULIN ASPART PROT & ASPART (70-30 MIX) 100 UNIT/ML ~~LOC~~ SUSP: 40.0000 [IU] | Freq: Two times a day (BID) | SUBCUTANEOUS | Status: DC

## 2012-09-08 MED ORDER — POTASSIUM CHLORIDE CRYS ER 20 MEQ PO TBCR
40.0000 meq | EXTENDED_RELEASE_TABLET | Freq: Once | ORAL | Status: AC
  Administered 2012-09-08: 40 meq via ORAL
  Filled 2012-09-08: qty 2

## 2012-09-08 MED ORDER — GLUCERNA SHAKE PO LIQD: 237.0000 mL | Freq: Two times a day (BID) | ORAL | Status: DC

## 2012-09-08 NOTE — Progress Notes (Signed)
Clinical Social Work Department BRIEF PSYCHOSOCIAL ASSESSMENT 09/08/2012  Patient:  Gabriel Rush     Account Number:  [id number]     Admit date:  09/05/2012  Clinical Social Worker:  Doroteo Glassman  Date/Time:  09/08/2012 02:26 PM  Referred by:  Physician  Date Referred:  09/08/2012 Referred for  Substance Abuse   Other Referral:   Interview type:  Patient Other interview type:    PSYCHOSOCIAL DATA Living Status:  ALONE Admitted from facility:   Level of care:   Primary support name:  Ferne Reus Primary support relationship to patient:  PARENT Degree of support available:   unknown    CURRENT CONCERNS Current Concerns  Substance Abuse   Other Concerns:    SOCIAL WORK ASSESSMENT / PLAN Met with Pt to discuss current SA and to assist Pt where needed.  CSW provided Pt with SA tx information offered in the community.    Pt reported that he has received the information before but that he will gladly take it again.    Pt reported that he is followed by Dr. Dub Mikes at [facility] and that he intends to go to [facility] on Monday for an Ax.  Pt stated that he needs to be re-evaluated by [facility] to determine if he's on the proper medications.  Pt feels that his meds need to be straightened out and then he'll be able to follow through with SA tx.    CSW provided Pt with a bus pass.    CSW thanked Pt for his time.    No further CSW needs.    CSW to sign off.   Assessment/plan status:  No Further Intervention Required Other assessment/ plan:   Information/referral to community resources:   IOP offered at [facility]  Area SA tx [employer]    PATIENT'S/FAMILY'S RESPONSE TO PLAN OF CARE: Pt eager to go to [facility] and have his meds adjusted.    Pt thanked CSW for time and assistance.   Providence Crosby, LCSWA Clinical Social Work [phone]

## 2012-09-08 NOTE — Progress Notes (Signed)
Pt ready for d/c. Went over d/c instructions with pt. Gave handouts on NPH insulin and instructed pt to roll vial to mix insulin before drawing up. Pt stated that he felt confident with drawing up his own insulin. PIV removed, WNL. No change in pt condition since AM assessment. Pt awaiting a bus ticket before d/c to home via self. Eugene Garnet RN  Pt d/c'd to home via self. Eugene Garnet RN

## 2012-09-08 NOTE — Discharge Summary (Addendum)
Physician Discharge Summary  Gabriel Rush WUJ:[id number] DOB: Oct 10, 1960 DOA: 09/05/2012  PCP: No PCP Per Patient  Admit date: 09/05/2012 Discharge date: 09/08/2012  Recommendations for Outpatient Follow-up:  1. Follow up with primary care doctor within 1 week of discharge to review blood sugars.  Routine diabetes health maintenance.    Discharge Diagnoses:  Principal Problem:   DKA (diabetic ketoacidoses) Active Problems:   DEPRESSION   SEIZURE DISORDER   Chronic pancreatitis   Cocaine abuse   Noncompliance with treatment   Alcohol abuse   Dyslipidemia   Diabetes mellitus type 2, uncontrolled, with complications   HTN (hypertension)   Hyponatremia   Protein-calorie malnutrition, severe   Discharge Condition: stable, improved  Diet recommendation: diabetic  Wt Readings from Last 3 Encounters:  09/08/12 75 kg (165 lb 5.5 oz)  08/14/12 79.8 kg (175 lb 14.8 oz)  07/25/12 81.647 kg (180 lb)    History of present illness:  52 year old male with past medical history of uncontrolled diabetes (on insulin, non compliant with insulin regimen), hypertension, alcohol and drug abuse (cocaine), seizure disorder, hypertension and dyslipidemia who presented to [facility] ED due to high blood sugar. Patient reports he could not afford medication. He does not have complaints of chest pain or shortness of breath or palpitations. He has chronic abdominal pain due to history of chronic pancreatitis. He reports some nausea but no vomiting. No lightheadedness or loss of consciousness. No reports of blood in stool or urine. No fever or chills. No urinary complaints.  In ED, BP was 148/100 and HR 107. O2 saturation was 100% on room air. Patient's blood sugar was found to be more than 600. His AG was 14. BMP revealed sodium of 124. UDS was positive for cocaine and ethanol level was WNL. Patient was started on insulin drip in ED.  Hospital Course:   DKA due to noncompliance with insulin.  Patient has  exhausted his hospital medication assistance and is medicaid pending.  He was started on insulin gtt and quickly restarted on his home insulin regimen after his gap closed and bicarb was > 20.  His fingersticks became markedly elevated on his home regimen, so his insulin was titrated up.  His total daily insulin requirement is approximately 80 to 90 units.  He will be discharged on 70/30 insulin which is the least expensive out of pocket option for him at 40 units BID.  He met with the diabetes educator and nutritionist and was encouraged to watch the diabetes videos.  He should follow up with his primary care doctor for ongoing diabetes management and adjustment of his insulin.    Diarrhea, C. Diff PCR neg and diarrhea resolved spontaneously.   Diabetic neuropathy, stable. Continued gabapentin  Chronic pancreatitis, stable.  Continue pancreatic lipase enzymes.  HTN, blood pressure well controlled.  Continued lisinopril  Dyslipidemia, stable.  Continued statin.  Pseudohyponatremia due to hyperglycemia resolved.  Hypokalemia due to urinary losses from osmotic diuresis.  Repleted with oral and IV potassium. Magnesium was wnl.    Seizure d/o, seizure free and continued dilantin.  Depression, stable.  Not suicidal.  Nearly homeless and unable to afford medications. Medicaid pending per the chart. SW and CM provided information about medication and housing assistance.    Severe protein calorie malnutrition, seen by nutrition who recommended glucerna shakes and MVI.  Consultants:  none Procedures:  none Antibiotics:  none    Discharge Exam: Filed Vitals:   09/08/12 0504  BP: 110/75  Pulse: 82  Temp:  98 F (36.7 C)  Resp:    Filed Vitals:   09/07/12 0535 09/07/12 1412 09/07/12 2200 09/08/12 0504  BP: 115/83 122/86 115/79 110/75  Pulse: 88 96 86 82  Temp: 98.3 F (36.8 C) 98.3 F (36.8 C) 98.9 F (37.2 C) 98 F (36.7 C)  TempSrc: Oral Oral Oral Oral  Resp: 16 18 18     Height:      Weight: 72.4 kg (159 lb 9.8 oz)   75 kg (165 lb 5.5 oz)  SpO2: 100% 100% 98% 100%   States he feels well.   General: AAM, No acute distress  HEENT: NCAT, MMM  Cardiovascular: RRR, nl S1, S2 no mrg, 2+ pulses, warm extremities  Respiratory: CTAB, no increased WOB  Abdomen: soft, hyperactive BS, nontender, ND  MSK: Normal tone and bulk, no LEE  Neuro: Grossly intact   Discharge Instructions      Discharge Orders   Future Orders Complete By Expires     Call MD for:  difficulty breathing, headache or visual disturbances  As directed     Call MD for:  extreme fatigue  As directed     Call MD for:  hives  As directed     Call MD for:  persistant dizziness or light-headedness  As directed     Call MD for:  persistant nausea and vomiting  As directed     Call MD for:  severe uncontrolled pain  As directed     Call MD for:  temperature >100.4  As directed     Diet Carb Modified  As directed     Discharge instructions  As directed     Comments:      You were hospitalized with high blood sugars.  Please make sure to get some test strips and check your fingersticks ideally 4 times per day before breakfast, lunch, dinner, and bed.  Please record your fingerstick numbers and bring them with you to your follow up appointment in one week.  Call for an appointment as soon as possible.  Please use mixed insulin (70/30) and inject 40 units before breakfast and before dinner.  Follow up with behavioral health as needed.    Increase activity slowly  As directed         Medication List    STOP taking these medications       insulin aspart 100 UNIT/ML injection  Commonly known as:  novoLOG     insulin glargine 100 UNIT/ML injection  Commonly known as:  LANTUS      TAKE these medications       feeding supplement Liqd  Take 237 mLs by mouth 2 (two) times daily between meals.     gabapentin 300 MG capsule  Commonly known as:  NEURONTIN  Take 300 mg by mouth 3 (three) times  daily.     insulin aspart protamine- aspart (70-30) 100 UNIT/ML injection  Commonly known as:  NOVOLOG MIX 70/30  Inject 0.4 mLs (40 Units total) into the skin 2 (two) times daily with a meal.     INSULIN SYRINGE .5CC/30GX1/2" 30G X 1/2" 0.5 ML Misc  250.00 IDDM.  Twice daily injection of 70/30.     lipase/protease/amylase [address] UNITS Cpep  Commonly known as:  CREON-12/PANCREASE  Take 2 capsules by mouth 3 (three) times daily.     lisinopril 10 MG tablet  Commonly known as:  PRINIVIL,ZESTRIL  Take 10 mg by mouth daily.     metFORMIN 500 MG tablet  Commonly  known as:  GLUCOPHAGE  Take 500 mg by mouth 2 (two) times daily with a meal.     mirtazapine 15 MG tablet  Commonly known as:  REMERON  Take 15 mg by mouth at bedtime.     multivitamin with minerals Tabs  Take 1 tablet by mouth daily.     naproxen 500 MG tablet  Commonly known as:  NAPROSYN  Take 500 mg by mouth 2 (two) times daily with a meal.     phenytoin 100 MG ER capsule  Commonly known as:  DILANTIN  Take 200 mg by mouth at bedtime.     simvastatin 10 MG tablet  Commonly known as:  ZOCOR  Take 10 mg by mouth at bedtime.     traZODone 100 MG tablet  Commonly known as:  DESYREL  Take 100 mg by mouth at bedtime.       Follow-up Information   Follow up with No PCP Per Patient. Schedule an appointment as soon as possible for a visit in 1 week.   Contact information:   [address] [place] [address] [phone] May call [phone] for assistance      Follow up with [facility] (walk in welcome)    Contact information:   [address] [place] [address] [phone]       The results of significant diagnostics from this hospitalization (including imaging, microbiology, ancillary and laboratory) are listed below for reference.    Significant Diagnostic Studies: No results found.  Microbiology: Recent Results (from the past 240 hour(s))   MRSA PCR SCREENING     Status: None   Collection Time    09/06/12  1:51 AM      Result Value Range Status   MRSA by PCR NEGATIVE  NEGATIVE Final   Comment:            The GeneXpert MRSA Assay (FDA     approved for NASAL specimens     only), is one component of a     comprehensive MRSA colonization     surveillance program. It is not     intended to diagnose MRSA     infection nor to guide or     monitor treatment for     MRSA infections.  CLOSTRIDIUM DIFFICILE BY PCR     Status: None   Collection Time    09/07/12  9:42 PM      Result Value Range Status   C difficile by pcr NEGATIVE  NEGATIVE Final     Labs: Basic Metabolic Panel:  Recent Labs Lab 09/06/12 0230 09/06/12 0515 09/06/12 0705 09/06/12 1315 09/07/12 0510 09/08/12 0540  NA 132* 135 136 135 133* 135  K 2.7* 2.7* 2.8* 3.0* 3.7 3.2*  CL 95* 100 100 99 100 103  CO2 24 24 25 27 24 24   GLUCOSE 507* 257* 170* 265* 440* 126*  BUN 6 4* 4* 4* 6 8  CREATININE 0.63 0.62 0.59 0.68 0.70 0.66  CALCIUM 9.3 9.2 9.1 9.2 9.1 8.3*  MG 1.8  --   --   --   --   --   PHOS 3.0  --  3.0  --   --   --    Liver Function Tests:  Recent Labs Lab 09/05/12 2335 09/06/12 0230 09/06/12 0515  AST 17 14 13   ALT 23 20 18   ALKPHOS 104 90 82  BILITOT 0.3 0.3 0.3  PROT 7.0 6.5 6.3  ALBUMIN 3.7  3.3* 3.2*   No results found for this basename: LIPASE, AMYLASE,  in the last 168 hours No results found for this basename: AMMONIA,  in the last 168 hours CBC:  Recent Labs Lab 09/05/12 2335 09/06/12 0230 09/06/12 0515  WBC 4.4 4.2 3.9*  NEUTROABS  --  2.2  --   HGB 13.9 12.8* 13.0  HCT 38.1* 34.7* 34.6*  MCV 82.1 80.5 79.9  PLT 175 170 161   Cardiac Enzymes: No results found for this basename: CKTOTAL, CKMB, CKMBINDEX, TROPONINI,  in the last 168 hours BNP: BNP (last 3 results) No results found for this basename: PROBNP,  in the last 8760 hours CBG:  Recent Labs Lab 09/07/12 0810 09/07/12 1127 09/07/12 1623  09/07/12 2136 09/08/12 0744  GLUCAP 344* 371* 168* 196* 130*    Time coordinating discharge: 45 minutes  Signed:  Taylore Hinde  [facility] 09/08/2012, 9:56 AM

## 2012-09-09 NOTE — Care Management Note (Signed)
    Page 1 of 2   09/09/2012     3:45:16 PM   CARE MANAGEMENT NOTE 09/09/2012  Patient:  Gabriel Rush, Gabriel Rush   Account Number:  [id number]  Date Initiated:  09/06/2012  Documentation initiated by:  DAVIS,RHONDA  Subjective/Objective Assessment:   pt with hx of diabetes, poor compliance, substance abuse.     Action/Plan:   pcp, f/u care- community health adult clinic   Anticipated DC Date:  09/09/2012   Anticipated DC Plan:  HOME/SELF CARE  In-house referral  PCP / [facility]      DC Planning Services  CM consult      [facility] Choice  NA   Choice offered to / List presented to:  NA   DME arranged  NA      DME agency  NA     HH arranged  NA      HH agency  NA   Status of service:  Completed, signed off Medicare Important Message given?  NA - LOS <3 / Initial given by admissions (If response is "NO", the following Medicare IM given date fields will be blank) Date Medicare IM given:   Date Additional Medicare IM given:    Discharge Disposition:  HOME/SELF CARE  Per UR Regulation:  Reviewed for med. necessity/level of care/duration of stay  If discussed at Long Length of Stay Meetings, dates discussed:    Comments:  09/09/12 [facility] RN,[name] NCM WEEKEND [phone] NO HH ORDERS OR NEEDS.  [id number] Earlene Plater, RN, [name], CCN: [phone] Case management. Chart reviewed for discharge planning and present needs. Discharge needs: Referral and information concerning the adult community care clinic. Next chart review due:  [id number] At discharge please give patient this information: [facility] [address] [place], [place] [address] Hours of Operation Mon - Fri: 9 a.m. - 6 p.m. Main: [phone]

## 2012-09-13 ENCOUNTER — Emergency Department (HOSPITAL_COMMUNITY)
Admission: EM | Admit: 2012-09-13 | Discharge: 2012-09-14 | Disposition: A | Discharge status: Other Acute Inpatient Hospital | Payer: No Typology Code available for payment source | Attending: Emergency Medicine | Admitting: Emergency Medicine

## 2012-09-13 ENCOUNTER — Encounter (HOSPITAL_COMMUNITY): Payer: Self-pay | Admitting: Emergency Medicine

## 2012-09-13 DIAGNOSIS — F10920 Alcohol use, unspecified with intoxication, uncomplicated: Secondary | ICD-10-CM

## 2012-09-13 DIAGNOSIS — F329 Major depressive disorder, single episode, unspecified: Secondary | ICD-10-CM | POA: Insufficient documentation

## 2012-09-13 DIAGNOSIS — Z79899 Other long term (current) drug therapy: Secondary | ICD-10-CM | POA: Insufficient documentation

## 2012-09-13 DIAGNOSIS — F1490 Cocaine use, unspecified, uncomplicated: Secondary | ICD-10-CM

## 2012-09-13 DIAGNOSIS — R45851 Suicidal ideations: Secondary | ICD-10-CM | POA: Insufficient documentation

## 2012-09-13 DIAGNOSIS — F1210 Cannabis abuse, uncomplicated: Secondary | ICD-10-CM | POA: Insufficient documentation

## 2012-09-13 DIAGNOSIS — F3289 Other specified depressive episodes: Secondary | ICD-10-CM | POA: Insufficient documentation

## 2012-09-13 DIAGNOSIS — E119 Type 2 diabetes mellitus without complications: Secondary | ICD-10-CM | POA: Insufficient documentation

## 2012-09-13 DIAGNOSIS — Z87891 Personal history of nicotine dependence: Secondary | ICD-10-CM | POA: Insufficient documentation

## 2012-09-13 DIAGNOSIS — Z8669 Personal history of other diseases of the nervous system and sense organs: Secondary | ICD-10-CM | POA: Insufficient documentation

## 2012-09-13 DIAGNOSIS — R109 Unspecified abdominal pain: Secondary | ICD-10-CM | POA: Insufficient documentation

## 2012-09-13 DIAGNOSIS — Z794 Long term (current) use of insulin: Secondary | ICD-10-CM | POA: Insufficient documentation

## 2012-09-13 DIAGNOSIS — F1010 Alcohol abuse, uncomplicated: Secondary | ICD-10-CM | POA: Insufficient documentation

## 2012-09-13 DIAGNOSIS — K861 Other chronic pancreatitis: Secondary | ICD-10-CM | POA: Insufficient documentation

## 2012-09-13 DIAGNOSIS — G8929 Other chronic pain: Secondary | ICD-10-CM | POA: Insufficient documentation

## 2012-09-13 DIAGNOSIS — R110 Nausea: Secondary | ICD-10-CM | POA: Insufficient documentation

## 2012-09-13 DIAGNOSIS — F1410 Cocaine abuse, uncomplicated: Secondary | ICD-10-CM | POA: Insufficient documentation

## 2012-09-13 DIAGNOSIS — I10 Essential (primary) hypertension: Secondary | ICD-10-CM | POA: Insufficient documentation

## 2012-09-13 DIAGNOSIS — Z8739 Personal history of other diseases of the musculoskeletal system and connective tissue: Secondary | ICD-10-CM | POA: Insufficient documentation

## 2012-09-13 DIAGNOSIS — E7800 Pure hypercholesterolemia, unspecified: Secondary | ICD-10-CM | POA: Insufficient documentation

## 2012-09-13 LAB — COMPREHENSIVE METABOLIC PANEL
Albumin: 4 g/dL (ref 3.5–5.2)
Alkaline Phosphatase: 96 U/L (ref 39–117)
BUN: 9 mg/dL (ref 6–23)
Potassium: 3.7 mEq/L (ref 3.5–5.1)
Sodium: 129 mEq/L — ABNORMAL LOW (ref 135–145)
Total Protein: 7.8 g/dL (ref 6.0–8.3)

## 2012-09-13 LAB — CBC
MCHC: 36.5 g/dL — ABNORMAL HIGH (ref 30.0–36.0)
RDW: 12.1 % (ref 11.5–15.5)

## 2012-09-13 LAB — GLUCOSE, CAPILLARY: Glucose-Capillary: 399 mg/dL — ABNORMAL HIGH (ref 70–99)

## 2012-09-13 LAB — ETHANOL: Alcohol, Ethyl (B): 50 mg/dL — ABNORMAL HIGH (ref 0–11)

## 2012-09-13 MED ORDER — METFORMIN HCL 500 MG PO TABS
500.0000 mg | ORAL_TABLET | Freq: Two times a day (BID) | ORAL | Status: DC
  Administered 2012-09-14 (×2): 500 mg via ORAL
  Filled 2012-09-13 (×3): qty 1

## 2012-09-13 MED ORDER — SIMVASTATIN 10 MG PO TABS
10.0000 mg | ORAL_TABLET | Freq: Every day | ORAL | Status: DC
  Administered 2012-09-13: 10 mg via ORAL
  Filled 2012-09-13 (×2): qty 1

## 2012-09-13 MED ORDER — VITAMIN B-1 100 MG PO TABS
100.0000 mg | ORAL_TABLET | Freq: Every day | ORAL | Status: DC
  Administered 2012-09-14 (×2): 100 mg via ORAL
  Filled 2012-09-13 (×2): qty 1

## 2012-09-13 MED ORDER — ALUM & MAG HYDROXIDE-SIMETH 200-200-20 MG/5ML PO SUSP: 30.0000 mL | ORAL | Status: DC | PRN

## 2012-09-13 MED ORDER — LORAZEPAM 2 MG/ML IJ SOLN: 0.0000 mg | Freq: Two times a day (BID) | INTRAMUSCULAR | Status: DC

## 2012-09-13 MED ORDER — INSULIN ASPART PROT & ASPART (70-30 MIX) 100 UNIT/ML ~~LOC~~ SUSP
40.0000 [IU] | Freq: Two times a day (BID) | SUBCUTANEOUS | Status: DC
  Administered 2012-09-14 (×2): 40 [IU] via SUBCUTANEOUS
  Filled 2012-09-13: qty 10

## 2012-09-13 MED ORDER — ZOLPIDEM TARTRATE 5 MG PO TABS: 5.0000 mg | ORAL_TABLET | Freq: Every evening | ORAL | Status: DC | PRN

## 2012-09-13 MED ORDER — MIRTAZAPINE 30 MG PO TABS
15.0000 mg | ORAL_TABLET | Freq: Every day | ORAL | Status: DC
  Administered 2012-09-13: 15 mg via ORAL
  Filled 2012-09-13: qty 1

## 2012-09-13 MED ORDER — ACETAMINOPHEN 325 MG PO TABS: 650.0000 mg | ORAL_TABLET | ORAL | Status: DC | PRN

## 2012-09-13 MED ORDER — LORAZEPAM 1 MG PO TABS: 0.0000 mg | ORAL_TABLET | Freq: Two times a day (BID) | ORAL | Status: DC

## 2012-09-13 MED ORDER — IBUPROFEN 200 MG PO TABS: 600.0000 mg | ORAL_TABLET | Freq: Three times a day (TID) | ORAL | Status: DC | PRN

## 2012-09-13 MED ORDER — INSULIN ASPART 100 UNIT/ML ~~LOC~~ SOLN
10.0000 [IU] | Freq: Once | SUBCUTANEOUS | Status: AC
  Administered 2012-09-13: 10 [IU] via SUBCUTANEOUS
  Filled 2012-09-13: qty 1

## 2012-09-13 MED ORDER — ONDANSETRON HCL 4 MG PO TABS: 4.0000 mg | ORAL_TABLET | Freq: Three times a day (TID) | ORAL | Status: DC | PRN

## 2012-09-13 MED ORDER — LISINOPRIL 10 MG PO TABS
10.0000 mg | ORAL_TABLET | Freq: Every day | ORAL | Status: DC
  Administered 2012-09-14: 10 mg via ORAL
  Filled 2012-09-13: qty 1

## 2012-09-13 MED ORDER — NICOTINE 21 MG/24HR TD PT24
21.0000 mg | MEDICATED_PATCH | Freq: Every day | TRANSDERMAL | Status: DC
  Filled 2012-09-13: qty 1

## 2012-09-13 MED ORDER — LORAZEPAM 1 MG PO TABS: 0.0000 mg | ORAL_TABLET | Freq: Four times a day (QID) | ORAL | Status: DC

## 2012-09-13 MED ORDER — GABAPENTIN 300 MG PO CAPS
300.0000 mg | ORAL_CAPSULE | Freq: Three times a day (TID) | ORAL | Status: DC
  Administered 2012-09-13 – 2012-09-14 (×3): 300 mg via ORAL
  Filled 2012-09-13 (×4): qty 1

## 2012-09-13 MED ORDER — PANCRELIPASE (LIP-PROT-AMYL) 12000-38000 UNITS PO CPEP
2.0000 | ORAL_CAPSULE | Freq: Three times a day (TID) | ORAL | Status: DC
  Administered 2012-09-13 – 2012-09-14 (×3): 2 via ORAL
  Filled 2012-09-13 (×4): qty 2

## 2012-09-13 MED ORDER — LORAZEPAM 1 MG PO TABS: 1.0000 mg | ORAL_TABLET | Freq: Three times a day (TID) | ORAL | Status: DC | PRN

## 2012-09-13 MED ORDER — THIAMINE HCL 100 MG/ML IJ SOLN: 100.0000 mg | Freq: Every day | INTRAMUSCULAR | Status: DC

## 2012-09-13 MED ORDER — LORAZEPAM 2 MG/ML IJ SOLN: 0.0000 mg | Freq: Four times a day (QID) | INTRAMUSCULAR | Status: DC

## 2012-09-13 MED ORDER — PHENYTOIN SODIUM EXTENDED 100 MG PO CAPS
200.0000 mg | ORAL_CAPSULE | Freq: Every day | ORAL | Status: DC
  Administered 2012-09-13: 200 mg via ORAL
  Filled 2012-09-13: qty 2

## 2012-09-13 NOTE — ED Notes (Signed)
Pt has 2 belonging bags in locker 26

## 2012-09-13 NOTE — ED Notes (Signed)
Pt has been wanded 

## 2012-09-13 NOTE — ED Provider Notes (Signed)
CSN: [id number]     Arrival date & time 09/13/12  2147 History    This chart was scribed for non-physician practitioner working with Olivia Mackie, MD by Ashley Jacobs, ED scribe. This patient was seen in room [facility] and the patient's care was started at 10:30 PM.    Chief Complaint  Patient presents with  . Medical Clearance    The history is provided by the patient and medical records. No language interpreter was used.   HPI Comments: Gabriel Rush is a 52 y.o. male who presents to the Emergency Department for medical clearance as he undergoes detox for alcohol, cocaine and marijuana after an SI attempt. Pt stated that he walked into traffic on [address]. He also states "to stop breathing will be cool" and that he doesn't want to breath anymore and it is attributed to diffused aching pain from his pancreatitis and bone pain for years. He reports taking cocaine and drinking 6-12 cans of beer the day of arrival. States he does want help getting off of drugs.  Denies hx of seizures from alcohol withdrawal but does state he feels like he needs alcohol every morning when he wakes up. Pt has a hx of diabetes, hypertension and pancreatitis. He explains they he has not taken his medication for the last month due to not being able to afford it. He also mentions a hx of depression and anxiety. However he denies a hx of schzophrenia, bipolar disorder and HI. Denies access to guns or knives at home.   Past Medical History  Diagnosis Date  . Hypertension   . Pancreatitis   . Alcohol abuse   . Chronic abdominal pain   . Alcoholism /alcohol abuse   . Neuromuscular disorder 07/02/2012    Diabetic neuropathy  . Hypercholesteremia   . Type II diabetes mellitus   . WUJWJXBJ(478.2)     "weekly" (08/13/2012)  . Grand mal     "maybe once/month" (08/13/2012)  . Arthritis     "hands & arms" (08/13/2012)  . Depression    Past Surgical History  Procedure Laterality Date  . Appendectomy  1970's    Family History  Problem Relation Age of Onset  . Hypertension     History  Substance Use Topics  . Smoking status: Former Smoker -- 0.50 packs/day for 8 years    Types: Cigarettes    Quit date: 12/21/1984  . Smokeless tobacco: Never Used  . Alcohol Use: Yes     Comment:  08/13/2012 "detox program; h/o heavy, binge drinking; last drink was 30 days ago"    Review of Systems  Gastrointestinal: Positive for nausea.  Psychiatric/Behavioral: Positive for suicidal ideas.  All other systems reviewed and are negative.    Allergies  Morphine and related  Home Medications   Current Outpatient Rx  Name  Route  Sig  Dispense  Refill  . feeding supplement (GLUCERNA SHAKE) LIQD   Oral   Take 237 mLs by mouth 2 (two) times daily between meals.      0   . gabapentin (NEURONTIN) 300 MG capsule   Oral   Take 300 mg by mouth 3 (three) times daily.         . insulin aspart protamine- aspart (NOVOLOG MIX 70/30) (70-30) 100 UNIT/ML injection   Subcutaneous   Inject 0.4 mLs (40 Units total) into the skin 2 (two) times daily with a meal.   30 mL   0   . lipase/protease/amylase (CREON-12/PANCREASE) [address] UNITS CPEP  Oral   Take 2 capsules by mouth 3 (three) times daily.         Marland Kitchen lisinopril (PRINIVIL,ZESTRIL) 10 MG tablet   Oral   Take 10 mg by mouth daily.         . metFORMIN (GLUCOPHAGE) 500 MG tablet   Oral   Take 500 mg by mouth 2 (two) times daily with a meal.         . mirtazapine (REMERON) 15 MG tablet   Oral   Take 15 mg by mouth at bedtime.         . Multiple Vitamin (MULTIVITAMIN WITH MINERALS) TABS   Oral   Take 1 tablet by mouth daily.         . naproxen (NAPROSYN) 500 MG tablet   Oral   Take 500 mg by mouth 2 (two) times daily with a meal.         . phenytoin (DILANTIN) 100 MG ER capsule   Oral   Take 200 mg by mouth at bedtime.         . simvastatin (ZOCOR) 10 MG tablet   Oral   Take 10 mg by mouth at bedtime.         . traZODone  (DESYREL) 100 MG tablet   Oral   Take 100 mg by mouth at bedtime.          BP 108/77  Pulse 73  Temp(Src) 98.4 F (36.9 C) (Oral)  Resp 16  SpO2 93% Physical Exam  Nursing note and vitals reviewed. Constitutional: He is oriented to person, place, and time. He appears well-developed and well-nourished. No distress.  HENT:  Head: Normocephalic and atraumatic.  Eyes: Conjunctivae are normal. No scleral icterus.  Neck: Normal range of motion.  Cardiovascular: Normal rate, regular rhythm and normal heart sounds.   Pulmonary/Chest: Effort normal and breath sounds normal. No respiratory distress. He has no wheezes. He has no rales. He exhibits no tenderness.  Abdominal: Soft. Bowel sounds are normal. He exhibits no distension and no mass. There is tenderness ( diffuse). There is no rebound and no guarding.  Musculoskeletal: Normal range of motion.  Neurological: He is alert and oriented to person, place, and time.  Skin: Skin is warm and dry. He is not diaphoretic.  Psychiatric: His speech is delayed. He is slowed. He exhibits a depressed mood. He expresses suicidal ideation. He expresses no homicidal ideation. He expresses suicidal plans. He expresses no homicidal plans.    ED Course  DIAGNOSTIC STUDIES: Oxygen Saturation is 93% on room air, adequate by my interpretation.    COORDINATION OF CARE: 10:37 PM Discussed course of care with pt. Pt understands and agrees.   Procedures (including critical care time)  Labs Reviewed  CBC - Abnormal; Notable for the following:    HCT 37.0 (*)    MCHC 36.5 (*)    All other components within normal limits  COMPREHENSIVE METABOLIC PANEL - Abnormal; Notable for the following:    Sodium 129 (*)    Chloride 87 (*)    Glucose, Bld 455 (*)    All other components within normal limits  ETHANOL - Abnormal; Notable for the following:    Alcohol, Ethyl (B) 50 (*)    All other components within normal limits  URINE RAPID DRUG SCREEN (HOSP  PERFORMED) - Abnormal; Notable for the following:    Cocaine POSITIVE (*)    Tetrahydrocannabinol POSITIVE (*)    All other components within normal limits  GLUCOSE,  CAPILLARY - Abnormal; Notable for the following:    Glucose-Capillary 399 (*)    All other components within normal limits  GLUCOSE, CAPILLARY - Abnormal; Notable for the following:    Glucose-Capillary 327 (*)    All other components within normal limits  GLUCOSE, CAPILLARY - Abnormal; Notable for the following:    Glucose-Capillary 208 (*)    All other components within normal limits   No results found. 1. Suicidal ideation   2. Alcohol intoxication, uncomplicated   3. Cocaine use   4. Chronic pancreatitis   5. DM (diabetes mellitus)     MDM  Pt is here for SI and alcohol detox.  Pt does have hx of DM and chronic pancreatitis.  Has not taken medication in over a month.  Reports diffuse bone and abdominal pain w/o nausea.  Pt's CBG was elevated at triage. Basic labs, drug and psychiatric orders place. Pt was given insulin to manage CBG.  Once medically cleared pt was moved to [facility] where they will decide pt's dispo.  Dr. Norlene Campbell is aware of pt.   I personally performed the services described in this documentation, which was scribed in my presence. The recorded information has been reviewed and is accurate.    Junius Finner, PA-C 09/14/12 [phone]

## 2012-09-13 NOTE — ED Notes (Signed)
Pt states he has been doing too many drugs and is here for detox   Pt states he has been doing crack and drinking for the past 3 days  Pt last drank about an hour ago and last used crack about an hour ago Pt states he just cant do it anymore Pt states he has SI

## 2012-09-14 ENCOUNTER — Inpatient Hospital Stay (HOSPITAL_COMMUNITY)
Admission: AD | Admit: 2012-09-14 | Discharge: 2012-09-21 | DRG: 897 | Disposition: A | Discharge status: Home / Self Care | Payer: No Typology Code available for payment source | Attending: Psychiatry | Admitting: Psychiatry

## 2012-09-14 ENCOUNTER — Encounter (HOSPITAL_COMMUNITY): Payer: Self-pay | Admitting: *Deleted

## 2012-09-14 DIAGNOSIS — F332 Major depressive disorder, recurrent severe without psychotic features: Secondary | ICD-10-CM

## 2012-09-14 DIAGNOSIS — F1010 Alcohol abuse, uncomplicated: Secondary | ICD-10-CM

## 2012-09-14 DIAGNOSIS — F1020 Alcohol dependence, uncomplicated: Secondary | ICD-10-CM | POA: Diagnosis present

## 2012-09-14 DIAGNOSIS — F1920 Other psychoactive substance dependence, uncomplicated: Principal | ICD-10-CM | POA: Diagnosis present

## 2012-09-14 DIAGNOSIS — Z79899 Other long term (current) drug therapy: Secondary | ICD-10-CM

## 2012-09-14 DIAGNOSIS — F1994 Other psychoactive substance use, unspecified with psychoactive substance-induced mood disorder: Secondary | ICD-10-CM | POA: Diagnosis present

## 2012-09-14 DIAGNOSIS — E1149 Type 2 diabetes mellitus with other diabetic neurological complication: Secondary | ICD-10-CM | POA: Diagnosis present

## 2012-09-14 DIAGNOSIS — I10 Essential (primary) hypertension: Secondary | ICD-10-CM | POA: Diagnosis present

## 2012-09-14 DIAGNOSIS — F1410 Cocaine abuse, uncomplicated: Secondary | ICD-10-CM

## 2012-09-14 DIAGNOSIS — E1142 Type 2 diabetes mellitus with diabetic polyneuropathy: Secondary | ICD-10-CM | POA: Diagnosis present

## 2012-09-14 DIAGNOSIS — F329 Major depressive disorder, single episode, unspecified: Secondary | ICD-10-CM

## 2012-09-14 LAB — RAPID URINE DRUG SCREEN, HOSP PERFORMED
Amphetamines: NOT DETECTED
Benzodiazepines: NOT DETECTED

## 2012-09-14 LAB — GLUCOSE, CAPILLARY
Glucose-Capillary: 327 mg/dL — ABNORMAL HIGH (ref 70–99)
Glucose-Capillary: 179 mg/dL — ABNORMAL HIGH (ref 70–99)

## 2012-09-14 MED ORDER — ADULT MULTIVITAMIN W/MINERALS CH
1.0000 | ORAL_TABLET | Freq: Every day | ORAL | Status: DC
  Administered 2012-09-15 – 2012-09-21 (×7): 1 via ORAL
  Filled 2012-09-14 (×9): qty 1

## 2012-09-14 MED ORDER — ADULT MULTIVITAMIN W/MINERALS CH
1.0000 | ORAL_TABLET | Freq: Every day | ORAL | Status: DC
  Administered 2012-09-14: 1 via ORAL
  Filled 2012-09-14: qty 1

## 2012-09-14 MED ORDER — VITAMIN B-1 100 MG PO TABS
100.0000 mg | ORAL_TABLET | Freq: Every day | ORAL | Status: DC
  Administered 2012-09-15 – 2012-09-21 (×7): 100 mg via ORAL
  Filled 2012-09-14 (×9): qty 1

## 2012-09-14 MED ORDER — SIMVASTATIN 20 MG PO TABS
20.0000 mg | ORAL_TABLET | Freq: Every day | ORAL | Status: DC
  Administered 2012-09-15 – 2012-09-20 (×6): 20 mg via ORAL
  Filled 2012-09-14 (×8): qty 1

## 2012-09-14 MED ORDER — HYDROXYZINE HCL 25 MG PO TABS
25.0000 mg | ORAL_TABLET | Freq: Four times a day (QID) | ORAL | Status: DC | PRN
  Filled 2012-09-14: qty 1

## 2012-09-14 MED ORDER — VITAMIN B-1 100 MG PO TABS: 100.0000 mg | ORAL_TABLET | Freq: Every day | ORAL | Status: DC

## 2012-09-14 MED ORDER — ACETAMINOPHEN 325 MG PO TABS
650.0000 mg | ORAL_TABLET | Freq: Four times a day (QID) | ORAL | Status: DC | PRN
  Administered 2012-09-19 – 2012-09-20 (×3): 650 mg via ORAL

## 2012-09-14 MED ORDER — DULOXETINE HCL 20 MG PO CPEP
40.0000 mg | ORAL_CAPSULE | Freq: Every day | ORAL | Status: DC
  Administered 2012-09-14: 40 mg via ORAL
  Filled 2012-09-14: qty 2

## 2012-09-14 MED ORDER — MAGNESIUM HYDROXIDE 400 MG/5ML PO SUSP: 30.0000 mL | Freq: Every day | ORAL | Status: DC | PRN

## 2012-09-14 MED ORDER — LOPERAMIDE HCL 2 MG PO CAPS
2.0000 mg | ORAL_CAPSULE | ORAL | Status: AC | PRN
  Administered 2012-09-14: 4 mg via ORAL
  Administered 2012-09-15: 2 mg via ORAL
  Administered 2012-09-15: 4 mg via ORAL
  Administered 2012-09-15 – 2012-09-16 (×3): 2 mg via ORAL
  Administered 2012-09-16: 4 mg via ORAL
  Administered 2012-09-17 (×2): 2 mg via ORAL

## 2012-09-14 MED ORDER — MIRTAZAPINE 15 MG PO TBDP
15.0000 mg | ORAL_TABLET | Freq: Every day | ORAL | Status: DC
  Administered 2012-09-14 – 2012-09-15 (×2): 15 mg via ORAL
  Filled 2012-09-14 (×4): qty 1

## 2012-09-14 MED ORDER — HYDROXYZINE HCL 25 MG PO TABS: 25.0000 mg | ORAL_TABLET | Freq: Four times a day (QID) | ORAL | Status: AC | PRN

## 2012-09-14 MED ORDER — PANTOPRAZOLE SODIUM 40 MG PO TBEC
40.0000 mg | DELAYED_RELEASE_TABLET | Freq: Two times a day (BID) | ORAL | Status: DC
  Administered 2012-09-15 – 2012-09-21 (×13): 40 mg via ORAL
  Filled 2012-09-14 (×21): qty 1

## 2012-09-14 MED ORDER — CHLORDIAZEPOXIDE HCL 25 MG PO CAPS
25.0000 mg | ORAL_CAPSULE | Freq: Every day | ORAL | Status: AC
  Administered 2012-09-19: 25 mg via ORAL
  Filled 2012-09-14: qty 1

## 2012-09-14 MED ORDER — THIAMINE HCL 100 MG/ML IJ SOLN: 100.0000 mg | Freq: Once | INTRAMUSCULAR | Status: DC

## 2012-09-14 MED ORDER — INSULIN ASPART PROT & ASPART (70-30 MIX) 100 UNIT/ML ~~LOC~~ SUSP
40.0000 [IU] | Freq: Two times a day (BID) | SUBCUTANEOUS | Status: DC
  Administered 2012-09-15 – 2012-09-21 (×13): 40 [IU] via SUBCUTANEOUS

## 2012-09-14 MED ORDER — INSULIN ASPART 100 UNIT/ML ~~LOC~~ SOLN
20.0000 [IU] | Freq: Once | SUBCUTANEOUS | Status: AC
  Administered 2012-09-14: 20 [IU] via SUBCUTANEOUS
  Filled 2012-09-14: qty 1

## 2012-09-14 MED ORDER — CHLORDIAZEPOXIDE HCL 25 MG PO CAPS
25.0000 mg | ORAL_CAPSULE | Freq: Four times a day (QID) | ORAL | Status: DC | PRN
  Administered 2012-09-14: 25 mg via ORAL
  Filled 2012-09-14: qty 1

## 2012-09-14 MED ORDER — CHLORDIAZEPOXIDE HCL 25 MG PO CAPS
25.0000 mg | ORAL_CAPSULE | ORAL | Status: AC
  Administered 2012-09-17 – 2012-09-18 (×2): 25 mg via ORAL
  Filled 2012-09-14: qty 1

## 2012-09-14 MED ORDER — LOPERAMIDE HCL 2 MG PO CAPS
2.0000 mg | ORAL_CAPSULE | ORAL | Status: DC | PRN
  Administered 2012-09-14 (×2): 2 mg via ORAL
  Filled 2012-09-14: qty 2
  Filled 2012-09-14: qty 1

## 2012-09-14 MED ORDER — TRAZODONE HCL 100 MG PO TABS
100.0000 mg | ORAL_TABLET | Freq: Every day | ORAL | Status: DC
  Administered 2012-09-14 – 2012-09-20 (×7): 100 mg via ORAL
  Filled 2012-09-14 (×9): qty 1
  Filled 2012-09-14: qty 14

## 2012-09-14 MED ORDER — ONDANSETRON 4 MG PO TBDP
4.0000 mg | ORAL_TABLET | Freq: Four times a day (QID) | ORAL | Status: AC | PRN
  Administered 2012-09-14 – 2012-09-15 (×2): 4 mg via ORAL

## 2012-09-14 MED ORDER — GABAPENTIN 300 MG PO CAPS
300.0000 mg | ORAL_CAPSULE | Freq: Three times a day (TID) | ORAL | Status: DC
  Administered 2012-09-15 – 2012-09-21 (×20): 300 mg via ORAL
  Filled 2012-09-14: qty 42
  Filled 2012-09-14 (×9): qty 1
  Filled 2012-09-14: qty 42
  Filled 2012-09-14 (×7): qty 1
  Filled 2012-09-14: qty 42
  Filled 2012-09-14 (×7): qty 1

## 2012-09-14 MED ORDER — ONDANSETRON 4 MG PO TBDP: 4.0000 mg | ORAL_TABLET | Freq: Four times a day (QID) | ORAL | Status: DC | PRN

## 2012-09-14 MED ORDER — VITAMIN B-1 100 MG PO TABS: 100.0000 mg | ORAL_TABLET | Freq: Once | ORAL | Status: DC

## 2012-09-14 MED ORDER — ALUM & MAG HYDROXIDE-SIMETH 200-200-20 MG/5ML PO SUSP: 30.0000 mL | ORAL | Status: DC | PRN

## 2012-09-14 MED ORDER — PANCRELIPASE (LIP-PROT-AMYL) 12000-38000 UNITS PO CPEP
2.0000 | ORAL_CAPSULE | Freq: Three times a day (TID) | ORAL | Status: DC
  Administered 2012-09-15 – 2012-09-21 (×20): 2 via ORAL
  Filled 2012-09-14 (×26): qty 2

## 2012-09-14 MED ORDER — LISINOPRIL 10 MG PO TABS
10.0000 mg | ORAL_TABLET | Freq: Every day | ORAL | Status: DC
  Administered 2012-09-15 – 2012-09-20 (×6): 10 mg via ORAL
  Filled 2012-09-14 (×9): qty 1

## 2012-09-14 MED ORDER — METFORMIN HCL 500 MG PO TABS
500.0000 mg | ORAL_TABLET | Freq: Two times a day (BID) | ORAL | Status: DC
  Administered 2012-09-15 – 2012-09-21 (×13): 500 mg via ORAL
  Filled 2012-09-14 (×17): qty 1

## 2012-09-14 MED ORDER — CHLORDIAZEPOXIDE HCL 25 MG PO CAPS
25.0000 mg | ORAL_CAPSULE | Freq: Four times a day (QID) | ORAL | Status: AC
  Administered 2012-09-14 – 2012-09-16 (×6): 25 mg via ORAL
  Filled 2012-09-14 (×6): qty 1

## 2012-09-14 MED ORDER — CHLORDIAZEPOXIDE HCL 25 MG PO CAPS
25.0000 mg | ORAL_CAPSULE | Freq: Three times a day (TID) | ORAL | Status: AC
  Administered 2012-09-16 – 2012-09-17 (×2): 25 mg via ORAL
  Filled 2012-09-14 (×3): qty 1

## 2012-09-14 MED ORDER — CHLORDIAZEPOXIDE HCL 25 MG PO CAPS
25.0000 mg | ORAL_CAPSULE | Freq: Four times a day (QID) | ORAL | Status: AC | PRN
  Administered 2012-09-16 (×3): 25 mg via ORAL
  Filled 2012-09-14 (×3): qty 1

## 2012-09-14 MED ORDER — GLUCERNA SHAKE PO LIQD
237.0000 mL | Freq: Two times a day (BID) | ORAL | Status: DC
  Administered 2012-09-15 – 2012-09-21 (×6): 237 mL via ORAL

## 2012-09-14 NOTE — Consult Note (Signed)
Patient seen, evaluated and recommendations made by me

## 2012-09-14 NOTE — Consult Note (Signed)
[facility] Psychiatry Consult   Reason for Consult:  Substance abuse Referring Physician:  [name] is an 52 y.o. male.  Assessment: AXIS I:  Alcohol Abuse and Major Depression, Recurrent severe AXIS II:  Deferred AXIS III:   Past Medical History  Diagnosis Date  . Hypertension   . Pancreatitis   . Alcohol abuse   . Chronic abdominal pain   . Alcoholism /alcohol abuse   . Neuromuscular disorder 07/02/2012    Diabetic neuropathy  . Hypercholesteremia   . Type II diabetes mellitus   . XBJYNWGN(562.1)     "weekly" (08/13/2012)  . Grand mal     "maybe once/month" (08/13/2012)  . Arthritis     "hands & arms" (08/13/2012)  . Depression    AXIS IV:  educational problems, other psychosocial or environmental problems and problems related to social environment AXIS V:  51-60 moderate symptoms  Plan:  Recommend psychiatric Inpatient admission when medically cleared.  Subjective:   Gabriel Rush is a 53 y.o. male patient admitted with Polysubstance Dependence.  HPI:  Gabriel Rush is a 52 y.o. male who presents to the Emergency Department for medical clearance as he undergoes detox for alcohol, cocaine and marijuana after an SI attempt. Pt stated that he walked into traffic on [address]. He also states "to stop breathing will be cool" and that he doesn't want to breath anymore and it is attributed to diffused aching pain from his pancreatitis and bone pain for years.  He has been drinking since age 8 and has been sober off and on.  He reports taking cocaine and drinking 6-12 cans of beer the day of arrival. States he does want help getting off of drugs. Denies hx of seizures from alcohol withdrawal but does state he feels like he needs alcohol every morning when he wakes up. Pt has a hx of diabetes, hypertension and pancreatitis. He explains they he has not taken his medication for the last month due to not being able to afford it. He also mentions a hx of depression and anxiety. He  states he drinks and uses Marijuana and cocaine to numb his bone pain. He denies SI but reports visual hallucination of seeing shadows.  He reports poor sleep and appetite despite taking Remron.   He denies a diagnosis of Schizophrenia but reports feeling paranoid thinking people are looking at him or are after him.  He was treated for alcohol problem in our inpatient unit 2 months ago according to patient.   Past Psychiatric History: Past Medical History  Diagnosis Date  . Hypertension   . Pancreatitis   . Alcohol abuse   . Chronic abdominal pain   . Alcoholism /alcohol abuse   . Neuromuscular disorder 07/02/2012    Diabetic neuropathy  . Hypercholesteremia   . Type II diabetes mellitus   . HYQMVHQI(696.2)     "weekly" (08/13/2012)  . Grand mal     "maybe once/month" (08/13/2012)  . Arthritis     "hands & arms" (08/13/2012)  . Depression     reports that he quit smoking about 27 years ago. His smoking use included Cigarettes. He has a 4 pack-year smoking history. He has never used smokeless tobacco. He reports that  drinks alcohol. He reports that he uses illicit drugs (Marijuana, "Crack" cocaine, and Cocaine). Family History  Problem Relation Age of Onset  . Hypertension             Allergies:   Allergies  Allergen Reactions  .  Morphine And Related Itching    Past Psychiatric History: Diagnosis:  Major depressive d/o, polysubstance abuse.  Hospitalizations:  [facility]  Outpatient Care:  NONE  Substance Abuse Care:  [facility]  Self-Mutilation: denied  Suicidal Attempts:  Overdose on pills  Violent Behaviors:  Denied   Objective: Blood pressure 132/95, pulse 108, temperature 98.3 F (36.8 C), temperature source Oral, resp. rate 20, SpO2 99.00%.There is no weight on file to calculate BMI. Results for orders placed during the hospital encounter of 09/13/12 (from the past 72 hour(s))  CBC     Status: Abnormal   Collection Time    09/13/12 10:25 PM      Result Value Range   WBC  5.0  4.0 - 10.5 K/uL   RBC 4.47  4.22 - 5.81 MIL/uL   Hemoglobin 13.5  13.0 - 17.0 g/dL   HCT 16.1 (*) 09.6 - 04.5 %   MCV 82.8  78.0 - 100.0 fL   MCH 30.2  26.0 - 34.0 pg   MCHC 36.5 (*) 30.0 - 36.0 g/dL   RDW 40.9  81.1 - 91.4 %   Platelets 226  150 - 400 K/uL  COMPREHENSIVE METABOLIC PANEL     Status: Abnormal   Collection Time    09/13/12 10:25 PM      Result Value Range   Sodium 129 (*) 135 - 145 mEq/L   Potassium 3.7  3.5 - 5.1 mEq/L   Chloride 87 (*) 96 - 112 mEq/L   CO2 25  19 - 32 mEq/L   Glucose, Bld 455 (*) 70 - 99 mg/dL   BUN 9  6 - 23 mg/dL   Creatinine, Ser 7.82  0.50 - 1.35 mg/dL   Calcium 9.5  8.4 - 95.6 mg/dL   Total Protein 7.8  6.0 - 8.3 g/dL   Albumin 4.0  3.5 - 5.2 g/dL   AST 20  0 - 37 U/L   ALT 35  0 - 53 U/L   Alkaline Phosphatase 96  39 - 117 U/L   Total Bilirubin 0.4  0.3 - 1.2 mg/dL   GFR calc non Af Amer >90  >90 mL/min   GFR calc Af Amer >90  >90 mL/min   Comment:            The eGFR has been calculated     using the CKD EPI equation.     This calculation has not been     validated in all clinical     situations.     eGFR's persistently     <90 mL/min signify     possible Chronic Kidney Disease.  ETHANOL     Status: Abnormal   Collection Time    09/13/12 10:25 PM      Result Value Range   Alcohol, Ethyl (B) 50 (*) 0 - 11 mg/dL   Comment:            LOWEST DETECTABLE LIMIT FOR     SERUM ALCOHOL IS 11 mg/dL     FOR MEDICAL PURPOSES ONLY  GLUCOSE, CAPILLARY     Status: Abnormal   Collection Time    09/13/12 10:50 PM      Result Value Range   Glucose-Capillary 399 (*) 70 - 99 mg/dL   Comment 1 Notify RN    GLUCOSE, CAPILLARY     Status: Abnormal   Collection Time    09/14/12  1:07 AM      Result Value Range   Glucose-Capillary 327 (*) 70 -  99 mg/dL  URINE RAPID DRUG SCREEN (HOSP PERFORMED)     Status: Abnormal   Collection Time    09/14/12  1:09 AM      Result Value Range   Opiates NONE DETECTED  NONE DETECTED   Cocaine POSITIVE  (*) NONE DETECTED   Benzodiazepines NONE DETECTED  NONE DETECTED   Amphetamines NONE DETECTED  NONE DETECTED   Tetrahydrocannabinol POSITIVE (*) NONE DETECTED   Barbiturates NONE DETECTED  NONE DETECTED   Comment:            DRUG SCREEN FOR MEDICAL PURPOSES     ONLY.  IF CONFIRMATION IS NEEDED     FOR ANY PURPOSE, NOTIFY LAB     WITHIN 5 DAYS.                LOWEST DETECTABLE LIMITS     FOR URINE DRUG SCREEN     Drug Class       Cutoff (ng/mL)     Amphetamine      1000     Barbiturate      200     Benzodiazepine   200     Tricyclics       300     Opiates          300     Cocaine          300     THC              50  GLUCOSE, CAPILLARY     Status: Abnormal   Collection Time    09/14/12  2:32 AM      Result Value Range   Glucose-Capillary 208 (*) 70 - 99 mg/dL  GLUCOSE, CAPILLARY     Status: Abnormal   Collection Time    09/14/12  7:59 AM      Result Value Range   Glucose-Capillary 179 (*) 70 - 99 mg/dL   Labs are reviewed and are pertinent for polysubstance abuse.  Current Facility-Administered Medications  Medication Dose Route Frequency Provider Last Rate Last Dose  . acetaminophen (TYLENOL) tablet 650 mg  650 mg Oral Q4H PRN Junius Finner, PA-C      . alum & mag hydroxide-simeth (MAALOX/MYLANTA) 200-200-20 MG/5ML suspension 30 mL  30 mL Oral PRN Junius Finner, PA-C      . gabapentin (NEURONTIN) capsule 300 mg  300 mg Oral TID Junius Finner, PA-C   300 mg at 09/14/12 1004  . ibuprofen (ADVIL,MOTRIN) tablet 600 mg  600 mg Oral Q8H PRN Junius Finner, PA-C      . insulin aspart protamine- aspart (NOVOLOG MIX 70/30) injection 40 Units  40 Units Subcutaneous BID WC Junius Finner, PA-C   40 Units at 09/14/12 0806  . lipase/protease/amylase (CREON-12/PANCREASE) capsule 2 capsule  2 capsule Oral TID Junius Finner, PA-C   2 capsule at 09/14/12 1004  . lisinopril (PRINIVIL,ZESTRIL) tablet 10 mg  10 mg Oral Daily Junius Finner, PA-C   10 mg at 09/14/12 1015  . LORazepam (ATIVAN)  tablet 0-4 mg  0-4 mg Oral Q6H Junius Finner, PA-C       Followed by  . [START ON 09/16/2012] LORazepam (ATIVAN) tablet 0-4 mg  0-4 mg Oral Q12H Junius Finner, PA-C      . LORazepam (ATIVAN) tablet 1 mg  1 mg Oral Q8H PRN Junius Finner, PA-C      . metFORMIN (GLUCOPHAGE) tablet 500 mg  500 mg Oral BID WC Junius Finner, PA-C   500 mg  at 09/14/12 0802  . mirtazapine (REMERON) tablet 15 mg  15 mg Oral QHS Junius Finner, PA-C   15 mg at 09/13/12 2357  . nicotine (NICODERM CQ - dosed in mg/24 hours) patch 21 mg  21 mg Transdermal Daily Junius Finner, PA-C      . ondansetron [facility]) tablet 4 mg  4 mg Oral Q8H PRN Junius Finner, PA-C      . phenytoin (DILANTIN) ER capsule 200 mg  200 mg Oral QHS Junius Finner, PA-C   200 mg at 09/13/12 2356  . simvastatin (ZOCOR) tablet 10 mg  10 mg Oral QHS Junius Finner, PA-C   10 mg at 09/13/12 2357  . thiamine (VITAMIN B-1) tablet 100 mg  100 mg Oral Daily Junius Finner, PA-C   100 mg at 09/14/12 1004   Or  . thiamine (B-1) injection 100 mg  100 mg Intravenous Daily Junius Finner, PA-C      . zolpidem (AMBIEN) tablet 5 mg  5 mg Oral QHS PRN Junius Finner, PA-C       Current Outpatient Prescriptions  Medication Sig Dispense Refill  . feeding supplement (GLUCERNA SHAKE) LIQD Take 237 mLs by mouth 2 (two) times daily between meals.    0  . gabapentin (NEURONTIN) 300 MG capsule Take 300 mg by mouth 3 (three) times daily.      . insulin aspart protamine- aspart (NOVOLOG MIX 70/30) (70-30) 100 UNIT/ML injection Inject 0.4 mLs (40 Units total) into the skin 2 (two) times daily with a meal.  30 mL  0  . lipase/protease/amylase (CREON-12/PANCREASE) 12000 UNITS CPEP Take 2 capsules by mouth 3 (three) times daily.      Marland Kitchen lisinopril (PRINIVIL,ZESTRIL) 10 MG tablet Take 10 mg by mouth daily.      . metFORMIN (GLUCOPHAGE) 500 MG tablet Take 500 mg by mouth 2 (two) times daily with a meal.      . mirtazapine (REMERON) 15 MG tablet Take 15 mg by mouth at bedtime.      .  Multiple Vitamin (MULTIVITAMIN WITH MINERALS) TABS Take 1 tablet by mouth daily.      . naproxen (NAPROSYN) 500 MG tablet Take 500 mg by mouth 2 (two) times daily with a meal.      . phenytoin (DILANTIN) 100 MG ER capsule Take 200 mg by mouth at bedtime.      . simvastatin (ZOCOR) 10 MG tablet Take 10 mg by mouth at bedtime.      . traZODone (DESYREL) 100 MG tablet Take 100 mg by mouth at bedtime.        Psychiatric Specialty Exam:     Blood pressure 132/95, pulse 108, temperature 98.3 F (36.8 C), temperature source Oral, resp. rate 20, SpO2 99.00%.There is no weight on file to calculate BMI.  General Appearance: Casual  Eye Contact::  Good  Speech:  Clear and Coherent and Normal Rate  Volume:  Normal  Mood:  Depressed  Affect:  Congruent  Thought Process:  Coherent and Intact  Orientation:  Full (Time, Place, and Person)  Thought Content:  Hallucinations: Visual and Paranoid Ideation  Suicidal Thoughts:  Yes.  without intent/plan  Homicidal Thoughts:  No  Memory:  Immediate;   Good Recent;   Good Remote;   Good  Judgement:  Poor  Insight:  Fair  Psychomotor Activity:  Normal  Concentration:  Good  Recall:  Good  Akathisia:  NA  Handed:  Right  AIMS (if indicated):     Assets:  Desire for  Improvement  Sleep:      Treatment Plan Summary:  Consult with and face to face interview with Dr Lucianne Muss Patient will be admitted for Alcohol detoxification using Librium protocol Patient will be referred to long term treatment facility after treatment. Daily contact with patient to assess and evaluate symptoms and progress in treatment Medication management  Earney Navy   PMHNP-BC 09/14/2012 2:11 PM

## 2012-09-14 NOTE — ED Notes (Signed)
Pt aaox3.  Pt calm and cooperative.  Pt denies SI/HI at this time.

## 2012-09-14 NOTE — ED Notes (Signed)
CBG: 208 

## 2012-09-14 NOTE — BHH Counselor (Signed)
Writer completed support paperwork and faxed to patient.  Writer informed nurse that the patient has been accepted to [facility].  Nurse informed Clinical research associate that she would contact ER MD that th patient needs to be discharged.

## 2012-09-14 NOTE — ED Notes (Signed)
Diabetic pt transferred from main ed, presents with SI, plan to run into traffic. Pt reports visual hallucinations, seeing figures.  Denies HI.  Alcohol & drug abuse noted.  Pt reports he drinks a case of beer per day and abuses crack & marijuana.  Feeling hopeless, Diag with Depression in the past by Dr Dub Mikes.  Past hx of SI attempt noted yrs ago, ingested pills.  Pt calm& cooperative at present.

## 2012-09-14 NOTE — Progress Notes (Signed)
[facility] CL provided patient with a [employer] Orange Card application, highlighting [facility].  °

## 2012-09-14 NOTE — ED Provider Notes (Signed)
Patient accepted at [facility] under Dr. Dub Mikes for suicidal ideations. Stable for transfer.   Richardean Canal, MD 09/14/12 2024

## 2012-09-14 NOTE — ED Notes (Signed)
Patient to go to bed 38, accompanied by Marcelino Duster, NT

## 2012-09-14 NOTE — ED Notes (Signed)
Patient given urinal to provide specimen. Will con't to monitor.

## 2012-09-14 NOTE — ED Provider Notes (Signed)
Medical screening examination/treatment/procedure(s) were performed by non-physician practitioner and as supervising physician I was immediately available for consultation/collaboration.  Johnmark Geiger M Izik Bingman, MD 09/14/12 0438 

## 2012-09-14 NOTE — ED Notes (Signed)
Erin, PA advised of patient CBG results

## 2012-09-14 NOTE — BH Assessment (Signed)
BHH Assessment Progress Note  Per Phillip Heal, Triage Specialist, pt accepted to [facility] by Dahlia Byes, NP.  Per Earl Lites, RN, Neurosurgeon, pt is to be assigned to the service of Geoffery Lyons, MD, Rm [id number].  At 19:33 I called back to Ava to notify her.  Doylene Canning, MA Assessment Counselor 09/14/2012 @ 19:35

## 2012-09-15 ENCOUNTER — Encounter (HOSPITAL_COMMUNITY): Payer: Self-pay | Admitting: Psychiatry

## 2012-09-15 DIAGNOSIS — F1920 Other psychoactive substance dependence, uncomplicated: Principal | ICD-10-CM | POA: Diagnosis present

## 2012-09-15 DIAGNOSIS — F1994 Other psychoactive substance use, unspecified with psychoactive substance-induced mood disorder: Secondary | ICD-10-CM

## 2012-09-15 LAB — GLUCOSE, CAPILLARY: Glucose-Capillary: 320 mg/dL — ABNORMAL HIGH (ref 70–99)

## 2012-09-15 MED ORDER — INSULIN ASPART 100 UNIT/ML ~~LOC~~ SOLN
0.0000 [IU] | Freq: Three times a day (TID) | SUBCUTANEOUS | Status: DC
  Administered 2012-09-15: 15 [IU] via SUBCUTANEOUS
  Administered 2012-09-15: 11 [IU] via SUBCUTANEOUS
  Administered 2012-09-16: 8 [IU] via SUBCUTANEOUS
  Administered 2012-09-16 – 2012-09-17 (×2): 3 [IU] via SUBCUTANEOUS
  Administered 2012-09-17: 2 [IU] via SUBCUTANEOUS
  Administered 2012-09-17: 5 [IU] via SUBCUTANEOUS
  Administered 2012-09-18 (×2): 3 [IU] via SUBCUTANEOUS
  Administered 2012-09-18: 5 [IU] via SUBCUTANEOUS
  Administered 2012-09-19 (×3): 3 [IU] via SUBCUTANEOUS
  Administered 2012-09-20: 2 [IU] via SUBCUTANEOUS
  Administered 2012-09-20: 3 [IU] via SUBCUTANEOUS
  Administered 2012-09-20: 2 [IU] via SUBCUTANEOUS
  Administered 2012-09-21: 5 [IU] via SUBCUTANEOUS
  Administered 2012-09-21: 3 [IU] via SUBCUTANEOUS

## 2012-09-15 MED ORDER — INSULIN ASPART 100 UNIT/ML ~~LOC~~ SOLN
4.0000 [IU] | Freq: Three times a day (TID) | SUBCUTANEOUS | Status: DC
  Administered 2012-09-15 – 2012-09-19 (×13): 4 [IU] via SUBCUTANEOUS
  Administered 2012-09-19: 12:00:00 via SUBCUTANEOUS
  Administered 2012-09-20 – 2012-09-21 (×5): 4 [IU] via SUBCUTANEOUS

## 2012-09-15 NOTE — Progress Notes (Signed)
D  Pt complained of feeling poorly this morning    He reports poor sleep low appetite and inability to pay attention   He requested to be put back on cymbalta for his pain relief  He is compliant with treatment  He denies feeling suicidal but reports it comes and goes and he will contract for safety and get staff if feeling overwhelmed   Pt is somewhat noncompliant with his diabetes diet A   Verbal support given  Medications administered and effectiveness monitored  Q 15 min checks  Encourage compliance with diabetic diet and reinforced diet choices  R   Pt safe at present

## 2012-09-15 NOTE — BHH Suicide Risk Assessment (Signed)
Suicide Risk Assessment  Admission Assessment     Nursing information obtained from:  Patient Demographic factors:  Male;Low socioeconomic status;Living alone;Unemployed Current Mental Status:  Suicidal ideation indicated by patient;Suicide plan;Intention to act on suicide plan;Belief that plan would result in death Loss Factors:  Financial problems / change in socioeconomic status Historical Factors:  Prior suicide attempts Risk Reduction Factors:  NA (Pt can't name any at this time)  CLINICAL FACTORS:   Depression:   Comorbid alcohol abuse/dependence Alcohol/Substance Abuse/Dependencies  COGNITIVE FEATURES THAT CONTRIBUTE TO RISK:  Closed-mindedness Polarized thinking Thought constriction (tunnel vision)    SUICIDE RISK:   Moderate:  Frequent suicidal ideation with limited intensity, and duration, some specificity in terms of plans, no associated intent, good self-control, limited dysphoria/symptomatology, some risk factors present, and identifiable protective factors, including available and accessible social support.  PLAN OF CARE: Supportive approach/coping skills/relapse prevention                              Detox/reassess and address the co morbidities  I certify that inpatient services furnished can reasonably be expected to improve the patient's condition.  Branna Cortina A 09/15/2012, 5:51 PM

## 2012-09-15 NOTE — Progress Notes (Signed)
D.  Pt pleasant on approach but states he is experiencing some withdrawal symptoms.  Pt also states that he has  hemorroids for which he uses Preparation H cream.  He requested that it be ordered here.  Denies SI/HI/hallucinations at this time.  Interacting appropriately within milieu.  Positive for evening group, see group notes.  A.  Support and encouragement offered, Doctor on call paged regarding Preparation H.  R.  Pt remains safe on unit, will continue to monitor.

## 2012-09-15 NOTE — BHH Group Notes (Signed)
BHH Group Notes:  (Clinical Social Work)  09/15/2012     10-11AM  Summary of Progress/Problems:   The main focus of today's process group was for the patient to identify ways in which they have in the past sabotaged their own recovery. Motivational Interviewing was utilized to ask the group members what they get out of their substance use, and what reasons they may have for wanting to change.  The Stages of Change were explained using a handout, and patients identified where they currently are with regard to stages of change.  The patient expressed that he will do absolutely anything to stop the pain in his bones and joints from his diabetes, "I will sedate by any means necessary."  He is not sure at this point that he wants to change.  He is tired of drinking the alcohol, but also feels it does sedate him from his pain.  He is still in the Contemplation Stage of change, he states.  Type of Therapy:  Group Therapy - Process   Participation Level:  Active  Participation Quality:  Attentive and Sharing  Affect:  Blunted  Cognitive:  Appropriate and Oriented  Insight:  Developing/Improving  Engagement in Therapy:  Developing/Improving  Modes of Intervention:  Education, Support and Processing, Motivational Interviewing  [name], LCSW 09/15/2012, 11:19 AM

## 2012-09-15 NOTE — Progress Notes (Signed)
Psychoeducational Group Note  Date:  09/15/2012 Time:  0945 am  Group Topic/Focus:  Identifying Needs:   The focus of this group is to help patients identify their personal needs that have been historically problematic and identify healthy behaviors to address their needs.  Participation Level:  Minimal  Participation Quality:  Appropriate  Affect:  Appropriate  Cognitive:  Appropriate  Insight:  Lacking  Engagement in Group:  Lacking  Additional Comments:    Andrena Mews 09/15/2012,3:19 PM

## 2012-09-15 NOTE — H&P (Signed)
Psychiatric Admission Assessment Adult  Patient Identification:  Gabriel Rush Date of Evaluation:  09/15/2012 Chief Complaint:  Mood Disorder NOS History of Present Illness:: Last five days smoking a lot of crack, marijuna, drinking a case a day. States he is seeing shadows. Was hearing voices, being paranoid. This is the worst it has been. He was here in May, went to [facility] did 34 days got and extension but they discharged him due ot his diabetes. States he was told they could not afford to keep getting his medications. He was discharged 'back to the streets." In July he started doing drugs again. He is homeelss, staying here and there. Admits to feeing hopeless, helpless, wanting to give up. Elements:  Location:  in patient. Quality:  unable to function. Severity:  severe. Timing:  every day. Duration:  for the last two months. Context:  actively using drugs and alcohol unable to stop with unerlying mood disorder and suicidal ideas. Associated Signs/Synptoms: Depression Symptoms:  depressed mood, anhedonia, fatigue, feelings of worthlessness/guilt, difficulty concentrating, hopelessness, suicidal thoughts with specific plan, anxiety, panic attacks, loss of energy/fatigue, disturbed sleep, weight loss, decreased appetite, (Hypo) Manic Symptoms:  Impulsivity, Labiality of Mood, Anxiety Symptoms:  Excessive Worry, Panic Symptoms, Psychotic Symptoms:  Hallucinations: Auditory Paranoia, PTSD Symptoms: Negative  Psychiatric Specialty Exam: Physical Exam  Review of Systems  Constitutional: Positive for malaise/fatigue.  HENT: Negative.   Eyes: Negative.   Respiratory: Negative.   Cardiovascular: Negative.   Gastrointestinal: Positive for nausea.  Genitourinary: Negative.   Musculoskeletal: Positive for myalgias and back pain.  Skin: Negative.   Neurological: Positive for dizziness, tremors and weakness.  Endo/Heme/Allergies: Negative.   Psychiatric/Behavioral: Positive  for depression, suicidal ideas and substance abuse. The patient is nervous/anxious and has insomnia.     Blood pressure 111/78, pulse 123, temperature 98.3 F (36.8 C), temperature source Oral, resp. rate 17, height 5' 7.25" (1.708 m), weight 70.308 kg (155 lb).Body mass index is 24.1 kg/(m^2).  General Appearance: Disheveled  Eye Contact::  Minimal  Speech:  Clear and Coherent, Slow and not spontaneous  Volume:  Decreased  Mood:  Anxious, Depressed, Hopeless and worried  Affect:  anxious, depressed, in distress  Thought Process:  Coherent and Goal Directed  Orientation:  Full (Time, Place, and Person)  Thought Content:  worreis, concerns, feeling overwhelmed  Suicidal Thoughts:  Yes.  without intent/plan  Homicidal Thoughts:  No  Memory:  Immediate;   Fair Recent;   Fair Remote;   Fair  Judgement:  Fair  Insight:  Present  Psychomotor Activity:  Restlessness  Concentration:  Fair  Recall:  Fair  Akathisia:  No  Handed:  Right  AIMS (if indicated):     Assets:  Desire for Improvement  Sleep:  Number of Hours: 6.75    Past Psychiatric History: Diagnosis: Polysubstance Dependence, Substance Induced Mood Disorder  Hospitalizations: [facility],   Outpatient Care: Not currently  Substance Abuse Care: [facility]   Self-Mutilation: Denies  Suicidal Attempts: Yes years ago by OD  Violent Behaviors: Denies   Past Medical History:   Past Medical History  Diagnosis Date  . Hypertension   . Pancreatitis   . Alcohol abuse   . Chronic abdominal pain   . Alcoholism /alcohol abuse   . Neuromuscular disorder 07/02/2012    Diabetic neuropathy  . Hypercholesteremia   . Type II diabetes mellitus   . ZOXWRUEA(540.9)     "weekly" (08/13/2012)  . Grand mal     "maybe once/month" (08/13/2012)  . Arthritis     "  hands & arms" (08/13/2012)  . Depression    Loss of Consciousness:  from falls Seizure History:  withdrawal  Traumatic Brain Injury:  falls Allergies:   Allergies  Allergen Reactions   . Morphine And Related Itching   PTA Medications: Prescriptions prior to admission  Medication Sig Dispense Refill  . feeding supplement (GLUCERNA SHAKE) LIQD Take 237 mLs by mouth 2 (two) times daily between meals.    0  . gabapentin (NEURONTIN) 300 MG capsule Take 300 mg by mouth 3 (three) times daily.      . insulin aspart protamine- aspart (NOVOLOG MIX 70/30) (70-30) 100 UNIT/ML injection Inject 0.4 mLs (40 Units total) into the skin 2 (two) times daily with a meal.  30 mL  0  . lipase/protease/amylase (CREON-12/PANCREASE) 12000 UNITS CPEP Take 2 capsules by mouth 3 (three) times daily.      Marland Kitchen lisinopril (PRINIVIL,ZESTRIL) 10 MG tablet Take 10 mg by mouth daily.      . metFORMIN (GLUCOPHAGE) 500 MG tablet Take 500 mg by mouth 2 (two) times daily with a meal.      . mirtazapine (REMERON) 15 MG tablet Take 15 mg by mouth at bedtime.      . Multiple Vitamin (MULTIVITAMIN WITH MINERALS) TABS Take 1 tablet by mouth daily.      . naproxen (NAPROSYN) 500 MG tablet Take 500 mg by mouth 2 (two) times daily with a meal.      . phenytoin (DILANTIN) 100 MG ER capsule Take 200 mg by mouth at bedtime.      . simvastatin (ZOCOR) 10 MG tablet Take 10 mg by mouth at bedtime.      . traZODone (DESYREL) 100 MG tablet Take 100 mg by mouth at bedtime.        Previous Psychotropic Medications:  Medication/Dose  Remeron, Seroquel. Elavil, and some other he cant remember               Substance Abuse History in the last 12 months:  yes  Consequences of Substance Abuse: Medical Consequences:  seizures Blackouts:   Withdrawal Symptoms:   Diaphoresis Diarrhea Headaches Nausea Tremors Vomiting  Social History:  reports that he quit smoking about 27 years ago. His smoking use included Cigarettes. He has a 4 pack-year smoking history. He has never used smokeless tobacco. He reports that  drinks alcohol. He reports that he uses illicit drugs (Marijuana, "Crack" cocaine, and Cocaine). Additional  Social History: History of alcohol / drug use?: Yes Longest period of sobriety (when/how long): one month Negative Consequences of Use: Personal relationships Withdrawal Symptoms: Diarrhea;Change in blood pressure;Nausea / Vomiting;Tachycardia                    Current Place of Residence:   Homeless Place of Birth:   Family Members: Marital Status:  Single Children: Denies  Sons:  Daughters: Relationships: Education:  11 th grade, then [employer] Educational Problems/Performance: Religious Beliefs/Practices: History of Abuse (Emotional/Phsycial/Sexual) Occupational Experiences; Carpentry, roofing, Fish farm manager History:  None. Legal History: Hobbies/Interests:  Family History:   Family History  Problem Relation Age of Onset  . Hypertension      Results for orders placed during the hospital encounter of 09/14/12 (from the past 72 hour(s))  GLUCOSE, CAPILLARY     Status: Abnormal   Collection Time    09/14/12  9:51 PM      Result Value Range   Glucose-Capillary 185 (*) 70 - 99 mg/dL  GLUCOSE, CAPILLARY  Status: Abnormal   Collection Time    09/15/12  6:27 AM      Result Value Range   Glucose-Capillary 320 (*) 70 - 99 mg/dL   Psychological Evaluations:  Assessment:   AXIS I:  Polysubstance Dependence, Substance Induced mood disorder AXIS II:  Deferred AXIS III:   Past Medical History  Diagnosis Date  . Hypertension   . Pancreatitis   . Alcohol abuse   . Chronic abdominal pain   . Alcoholism /alcohol abuse   . Neuromuscular disorder 07/02/2012    Diabetic neuropathy  . Hypercholesteremia   . Type II diabetes mellitus   . WUJWJXBJ(478.2)     "weekly" (08/13/2012)  . Grand mal     "maybe once/month" (08/13/2012)  . Arthritis     "hands & arms" (08/13/2012)  . Depression    AXIS IV:  economic problems, housing problems, other psychosocial or environmental problems, problems related to social environment, problems with access to health care services and  problems with primary support group AXIS V:  41-50 serious symptoms  Treatment Plan/Recommendations:  Supportive approach/coping skills/relapse prevention                                                                  Detox/reassess and address the co morbidities  Treatment Plan Summary: Daily contact with patient to assess and evaluate symptoms and progress in treatment Medication management Current Medications:  Current Facility-Administered Medications  Medication Dose Route Frequency Provider Last Rate Last Dose  . acetaminophen (TYLENOL) tablet 650 mg  650 mg Oral Q6H PRN Court Joy, PA-C      . alum & mag hydroxide-simeth (MAALOX/MYLANTA) 200-200-20 MG/5ML suspension 30 mL  30 mL Oral Q4H PRN Court Joy, PA-C      . chlordiazePOXIDE (LIBRIUM) capsule 25 mg  25 mg Oral Q6H PRN Court Joy, PA-C      . chlordiazePOXIDE (LIBRIUM) capsule 25 mg  25 mg Oral QID Court Joy, PA-C   25 mg at 09/15/12 9562   Followed by  . [START ON 09/16/2012] chlordiazePOXIDE (LIBRIUM) capsule 25 mg  25 mg Oral TID Court Joy, PA-C       Followed by  . [START ON 09/17/2012] chlordiazePOXIDE (LIBRIUM) capsule 25 mg  25 mg Oral BH-qamhs Court Joy, PA-C       Followed by  . [START ON 09/19/2012] chlordiazePOXIDE (LIBRIUM) capsule 25 mg  25 mg Oral Daily Court Joy, PA-C      . feeding supplement (GLUCERNA SHAKE) liquid 237 mL  237 mL Oral BID BM Court Joy, PA-C   237 mL at 09/15/12 0843  . gabapentin (NEURONTIN) capsule 300 mg  300 mg Oral TID Court Joy, PA-C   300 mg at 09/15/12 1308  . hydrOXYzine (ATARAX/VISTARIL) tablet 25 mg  25 mg Oral Q6H PRN Court Joy, PA-C      . insulin aspart protamine- aspart (NOVOLOG MIX 70/30) injection 40 Units  40 Units Subcutaneous BID WC Court Joy, PA-C   40 Units at 09/15/12 0805  . lipase/protease/amylase (CREON-12/PANCREASE) capsule 2 capsule  2 capsule Oral TID WC Court Joy, PA-C   2 capsule at 09/15/12 0610   . lisinopril (PRINIVIL,ZESTRIL) tablet 10 mg  10  mg Oral Daily Court Joy, PA-C   10 mg at 09/15/12 1610  . loperamide (IMODIUM) capsule 2-4 mg  2-4 mg Oral PRN Court Joy, PA-C   2 mg at 09/15/12 9604  . magnesium hydroxide (MILK OF MAGNESIA) suspension 30 mL  30 mL Oral Daily PRN Court Joy, PA-C      . metFORMIN (GLUCOPHAGE) tablet 500 mg  500 mg Oral BID WC Court Joy, PA-C   500 mg at 09/15/12 5409  . mirtazapine (REMERON SOL-TAB) disintegrating tablet 15 mg  15 mg Oral QHS Court Joy, PA-C   15 mg at 09/14/12 2218  . multivitamin with minerals tablet 1 tablet  1 tablet Oral Daily Court Joy, PA-C   1 tablet at 09/15/12 8119  . ondansetron (ZOFRAN-ODT) disintegrating tablet 4 mg  4 mg Oral Q6H PRN Court Joy, PA-C   4 mg at 09/14/12 2220  . pantoprazole (PROTONIX) EC tablet 40 mg  40 mg Oral BID AC Court Joy, PA-C   40 mg at 09/15/12 1478  . simvastatin (ZOCOR) tablet 20 mg  20 mg Oral q1800 Court Joy, PA-C      . thiamine (B-1) injection 100 mg  100 mg Intramuscular Once Court Joy, PA-C      . thiamine (VITAMIN B-1) tablet 100 mg  100 mg Oral Daily Court Joy, PA-C   100 mg at 09/15/12 2956  . traZODone (DESYREL) tablet 100 mg  100 mg Oral QHS Court Joy, PA-C   100 mg at 09/14/12 2218    Observation Level/Precautions:  15 minute checks  Laboratory:  As per the ED  Psychotherapy:  Individual/group  Medications:  Librium detox/reassess co morbidities  Consultations:    Discharge Concerns:    Estimated LOS: 5 days  Other:     I certify that inpatient services furnished can reasonably be expected to improve the patient's condition.   Sanjuana Mruk A 8/9/20149:36 AM

## 2012-09-15 NOTE — [facility] Group Notes (Signed)
[facility] Group Notes:  (Nursing/MHT/Case Management/Adjunct)  Date:  09/15/2012  Time:  1:25 PM  Type of Therapy:  Psychoeducational Skills  Participation Level:  Did Not Attend  Gabriel Rush 09/15/2012, 1:25 PM 

## 2012-09-16 LAB — GLUCOSE, CAPILLARY
Glucose-Capillary: 252 mg/dL — ABNORMAL HIGH (ref 70–99)
Glucose-Capillary: 123 mg/dL — ABNORMAL HIGH (ref 70–99)

## 2012-09-16 MED ORDER — PHENYLEPH-SHARK LIV OIL-MO-PET 0.25-3-14-71.9 % RE OINT
TOPICAL_OINTMENT | Freq: Two times a day (BID) | RECTAL | Status: DC | PRN
  Administered 2012-09-16: 06:00:00 via RECTAL
  Filled 2012-09-16: qty 28.4

## 2012-09-16 MED ORDER — DULOXETINE HCL 30 MG PO CPEP
30.0000 mg | ORAL_CAPSULE | Freq: Every day | ORAL | Status: DC
  Administered 2012-09-16 – 2012-09-21 (×6): 30 mg via ORAL
  Filled 2012-09-16 (×7): qty 1
  Filled 2012-09-16: qty 14

## 2012-09-16 MED ORDER — QUETIAPINE FUMARATE 100 MG PO TABS
100.0000 mg | ORAL_TABLET | Freq: Every day | ORAL | Status: DC
  Administered 2012-09-16 – 2012-09-20 (×5): 100 mg via ORAL
  Filled 2012-09-16 (×3): qty 1
  Filled 2012-09-16: qty 14
  Filled 2012-09-16 (×3): qty 1

## 2012-09-16 NOTE — Progress Notes (Signed)
Patient did attend the evening speaker AA meeting.  

## 2012-09-16 NOTE — Progress Notes (Signed)
NUTRITION ASSESSMENT Patient meets criteria for severe malnutrition in the context of social/emvonmental causes as evidenced by 14% weight loss and <75% intake for > 1 month.  Pt identified as at risk on the Malnutrition Screen Tool  INTERVENTION: 1. Educated patient on the importance of nutrition and encouraged intake of food and beverages.  Discussed appropriate choices on CHO MOD diet. 2. Discussed weight goals. 3. Supplements: Continue Glucerna Shakes bid, MVI, thiamine daily  NUTRITION DIAGNOSIS: Unintentional weight loss related to sub-optimal intake as evidenced by pt report.   Goal: Pt to meet >/= 90% of their estimated nutrition needs.  Monitor:  PO intake  Assessment:  Patient admitted with Drug and ETOH abuse.  Uncontrolled DM with continued elevated HgbA1C of 12.4.  Patient known from last admit.  Drinks regular soda and lemonade.  Intake good per patient.  Homeless.  UBW 189 lbs per patient.  82% UBW.   52 y.o. male  Height: Ht Readings from Last 1 Encounters:  09/14/12 5' 7.25" (1.708 m)    Weight: Wt Readings from Last 1 Encounters:  09/14/12 155 lb (70.308 kg)    Weight Hx: Wt Readings from Last 10 Encounters:  09/14/12 155 lb (70.308 kg)  09/08/12 165 lb 5.5 oz (75 kg)  08/14/12 175 lb 14.8 oz (79.8 kg)  07/25/12 180 lb (81.647 kg)  07/20/12 180 lb (81.647 kg)  07/02/12 178 lb (80.74 kg)  07/01/12 158 lb (71.668 kg)  06/11/12 165 lb 12.6 oz (75.2 kg)  05/29/12 158 lb 11.7 oz (72 kg)  10/23/11 205 lb 7.5 oz (93.2 kg)    BMI:  Body mass index is 24.1 kg/(m^2). Pt meets criteria for wnl based on current BMI.  Estimated Nutritional Needs: Kcal: 25-30 kcal/kg Protein: > 1 gram protein/kg Fluid: 1 ml/kcal  Diet Order: Carb Control Pt is also offered choice of unit snacks mid-morning and mid-afternoon.  Pt is eating as desired.   Lab results and medications reviewed.   Oran Rein, RD, LDN Clinical Inpatient Dietitian Pager:  [phone] Weekend  and after hours pager:  [phone]

## 2012-09-16 NOTE — [facility] Group Notes (Signed)
[facility] Group Notes:  (Nursing/MHT/Case Management/Adjunct)  Date:  09/16/2012  Time:  3:21 PM  Type of Therapy:  Nurse Education  Participation Level:  Active  Participation Quality:  Appropriate and Attentive  Affect:  Appropriate  Cognitive:  Alert and Appropriate  Insight:  Improving  Engagement in Group:  Improving  Modes of Intervention:  Problem-solving  Summary of Progress/Problems:  Cresenciano Lick 09/16/2012, 3:21 PM

## 2012-09-16 NOTE — Progress Notes (Signed)
D.  Pt pleasant on approach, hopeful that added Seroquel will help him to sleep tonight as he got very little last night.  Withdrawal symptoms still present but "winding down"   Denies SI/HI/hallucinations at this time.  Interacting appropriately within milieu.  Positive for evening AA group.  A.  Support and encouragement offered  R.  Pt remains safe on unit, will continue to monitor.

## 2012-09-16 NOTE — [facility] Group Notes (Signed)
[facility] Group Notes:  (Clinical Social Work)  09/16/2012  10:00-11:00AM  Summary of Progress/Problems:   The main focus of today's process group was to   identify the patient's current support system and decide on other supports that can be put in place.  Four definitions/levels of support were discussed and an exercise was utilized to show how much stronger we become with additional supports.  An emphasis was placed on using counselor, doctor, therapy groups, 12-step groups, and problem-specific support groups to expand supports, as well as doing something different than has been done before. The patient expressed a willingness to add professional medical support for his diabetes.  Type of Therapy:  Process Group with Motivational Interviewing  Participation Level:  Active  Participation Quality:  Attentive and Sharing  Affect:  Appropriate  Cognitive:  Alert, Appropriate and Oriented  Insight:  Engaged  Engagement in Therapy:  Engaged  Modes of Intervention:   Education, Support and Processing, Activity  [employer], LCSW 09/16/2012, 12:43 PM

## 2012-09-16 NOTE — Progress Notes (Signed)
[facility] MD Progress Note  09/16/2012 3:29 PM Gabriel Rush  MRN:  [id number] Subjective:  Gabriel Rush is having a rough time. He is experiencing diarrhea, having hemorrhoids pop up. He did not sleep last night. Has used Seroquel in the past and will like to try it again (was made aware of the effect of Seroquel on the blood sugar). He will also like to pursue the Cymbalta for the Neuropathy as states that the Neurontin does not help that much. Still very "sahky," with hs stomach being upset, not able to eat a full meal.  Diagnosis:  Polysubstance Dependence, Substance Induced Mood Disorder  ADL's:  Intact  Sleep: Poor  Appetite:  Poor  Suicidal Ideation:  Plan:  denies Intent:  denies Means:  denies Homicidal Ideation:  Plan:  denies Intent:  denies Means:  denies AEB (as evidenced by):  Psychiatric Specialty Exam: Review of Systems  Constitutional: Positive for malaise/fatigue.  HENT: Negative.   Eyes: Negative.   Respiratory: Negative.   Cardiovascular: Negative.   Gastrointestinal: Positive for nausea, abdominal pain and diarrhea.       Hemmorrhoids  Genitourinary: Negative.   Musculoskeletal: Positive for myalgias and back pain.  Skin: Negative.   Neurological: Positive for dizziness, tremors and weakness.  Endo/Heme/Allergies: Negative.   Psychiatric/Behavioral: Positive for depression and substance abuse. The patient is nervous/anxious and has insomnia.     Blood pressure 105/72, pulse 98, temperature 98.3 F (36.8 C), temperature source Oral, resp. rate 16, height 5' 7.25" (1.708 m), weight 70.308 kg (155 lb).Body mass index is 24.1 kg/(m^2).  General Appearance: Disheveled  Eye Solicitor::  Fair  Speech:  Clear and Coherent and Slow  Volume:  Decreased  Mood:  Anxious, Dysphoric and "not feeling well"  Affect:  anxious, worries, uncomfortable  Thought Process:  Coherent and Goal Directed  Orientation:  Full (Time, Place, and Person)  Thought Content:  somatically focus,  worries, concerns  Suicidal Thoughts:  No  Homicidal Thoughts:  No  Memory:  Immediate;   Fair Recent;   Fair Remote;   Fair  Judgement:  Fair  Insight:  Shallow  Psychomotor Activity:  Restlessness  Concentration:  Fair  Recall:  Fair  Akathisia:  No  Handed:  Right  AIMS (if indicated):     Assets:  Desire for Improvement  Sleep:  Number of Hours: 2.25   Current Medications: Current Facility-Administered Medications  Medication Dose Route Frequency Provider Last Rate Last Dose  . acetaminophen (TYLENOL) tablet 650 mg  650 mg Oral Q6H PRN Court Joy, PA-C      . alum & mag hydroxide-simeth (MAALOX/MYLANTA) 200-200-20 MG/5ML suspension 30 mL  30 mL Oral Q4H PRN Court Joy, PA-C      . chlordiazePOXIDE (LIBRIUM) capsule 25 mg  25 mg Oral Q6H PRN Court Joy, PA-C   25 mg at 09/16/12 1156  . chlordiazePOXIDE (LIBRIUM) capsule 25 mg  25 mg Oral TID Court Joy, PA-C       Followed by  . [START ON 09/17/2012] chlordiazePOXIDE (LIBRIUM) capsule 25 mg  25 mg Oral BH-qamhs Court Joy, PA-C       Followed by  . [START ON 09/19/2012] chlordiazePOXIDE (LIBRIUM) capsule 25 mg  25 mg Oral Daily Court Joy, PA-C      . DULoxetine (CYMBALTA) DR capsule 30 mg  30 mg Oral Daily Rachael Fee, MD   30 mg at 09/16/12 1155  . feeding supplement (GLUCERNA SHAKE) liquid 237 mL  237  mL Oral BID BM Court Joy, PA-C   237 mL at 09/15/12 0843  . gabapentin (NEURONTIN) capsule 300 mg  300 mg Oral TID Court Joy, PA-C   300 mg at 09/16/12 1155  . hydrOXYzine (ATARAX/VISTARIL) tablet 25 mg  25 mg Oral Q6H PRN Court Joy, PA-C      . insulin aspart (novoLOG) injection 0-15 Units  0-15 Units Subcutaneous TID WC Sanjuana Kava, NP   8 Units at 09/16/12 1159  . insulin aspart (novoLOG) injection 4 Units  4 Units Subcutaneous TID WC Sanjuana Kava, NP   4 Units at 09/16/12 1158  . insulin aspart protamine- aspart (NOVOLOG MIX 70/30) injection 40 Units  40 Units Subcutaneous  BID WC Court Joy, PA-C   40 Units at 09/16/12 [phone]  . lipase/protease/amylase (CREON-12/PANCREASE) capsule 2 capsule  2 capsule Oral TID WC Court Joy, PA-C   2 capsule at 09/16/12 1157  . lisinopril (PRINIVIL,ZESTRIL) tablet 10 mg  10 mg Oral Daily Court Joy, PA-C   10 mg at 09/16/12 9604  . loperamide (IMODIUM) capsule 2-4 mg  2-4 mg Oral PRN Court Joy, PA-C   2 mg at 09/16/12 1505  . magnesium hydroxide (MILK OF MAGNESIA) suspension 30 mL  30 mL Oral Daily PRN Court Joy, PA-C      . metFORMIN (GLUCOPHAGE) tablet 500 mg  500 mg Oral BID WC Court Joy, PA-C   500 mg at 09/16/12 0835  . multivitamin with minerals tablet 1 tablet  1 tablet Oral Daily Court Joy, PA-C   1 tablet at 09/16/12 [phone]  . ondansetron (ZOFRAN-ODT) disintegrating tablet 4 mg  4 mg Oral Q6H PRN Court Joy, PA-C   4 mg at 09/15/12 2124  . pantoprazole (PROTONIX) EC tablet 40 mg  40 mg Oral BID AC Court Joy, PA-C   40 mg at 09/16/12 8119  . phenylephrine-shark liver oil-mineral oil-petrolatum (PREPARATION H) rectal ointment   Rectal BID PRN Rachael Fee, MD      . QUEtiapine (SEROQUEL) tablet 100 mg  100 mg Oral QHS Rachael Fee, MD      . simvastatin (ZOCOR) tablet 20 mg  20 mg Oral q1800 Court Joy, PA-C   20 mg at 09/15/12 1708  . thiamine (B-1) injection 100 mg  100 mg Intramuscular Once Court Joy, PA-C      . thiamine (VITAMIN B-1) tablet 100 mg  100 mg Oral Daily Court Joy, PA-C   100 mg at 09/16/12 0835  . traZODone (DESYREL) tablet 100 mg  100 mg Oral QHS Court Joy, PA-C   100 mg at 09/15/12 2124    Lab Results:  Results for orders placed during the hospital encounter of 09/14/12 (from the past 48 hour(s))  GLUCOSE, CAPILLARY     Status: Abnormal   Collection Time    09/14/12  9:51 PM      Result Value Range   Glucose-Capillary 185 (*) 70 - 99 mg/dL  GLUCOSE, CAPILLARY     Status: Abnormal   Collection Time    09/15/12  6:27 AM       Result Value Range   Glucose-Capillary 320 (*) 70 - 99 mg/dL  GLUCOSE, CAPILLARY     Status: Abnormal   Collection Time    09/15/12 11:45 AM      Result Value Range   Glucose-Capillary 381 (*) 70 - 99 mg/dL   Comment 1  Notify RN    GLUCOSE, CAPILLARY     Status: Abnormal   Collection Time    09/15/12  5:14 PM      Result Value Range   Glucose-Capillary 311 (*) 70 - 99 mg/dL  GLUCOSE, CAPILLARY     Status: Abnormal   Collection Time    09/15/12  9:12 PM      Result Value Range   Glucose-Capillary 160 (*) 70 - 99 mg/dL   Comment 1 Notify RN     Comment 2 Documented in Chart    GLUCOSE, CAPILLARY     Status: Abnormal   Collection Time    09/16/12 11:28 AM      Result Value Range   Glucose-Capillary 252 (*) 70 - 99 mg/dL    Physical Findings: AIMS: Facial and Oral Movements Muscles of Facial Expression: None, normal Lips and Perioral Area: None, normal Jaw: None, normal Tongue: None, normal,Extremity Movements Upper (arms, wrists, hands, fingers): None, normal Lower (legs, knees, ankles, toes): None, normal, Trunk Movements Neck, shoulders, hips: None, normal, Overall Severity Severity of abnormal movements (highest score from questions above): None, normal Incapacitation due to abnormal movements: None, normal Patient's awareness of abnormal movements (rate only patient's report): No Awareness, Dental Status Current problems with teeth and/or dentures?: No Does patient usually wear dentures?: No  CIWA:  CIWA-Ar Total: 2 COWS:     Treatment Plan Summary: Daily contact with patient to assess and evaluate symptoms and progress in treatment Medication management  Plan: Supportive approach/coping skills/relapse prevention           Detox           Continue to re asess and address the co morbidities            Seroquel 100 mg HS            Cymbalta 30 mg daily  Medical Decision Making Problem Points:  Review of psycho-social stressors (1) Data Points:  Review of  medication regiment & side effects (2) Review of new medications or change in dosage (2)  I certify that inpatient services furnished can reasonably be expected to improve the patient's condition.   Artemisia Auvil A 09/16/2012, 3:29 PM

## 2012-09-16 NOTE — Progress Notes (Signed)
[facility] Group Notes:  (Nursing/MHT/Case Management/Adjunct)  Date:  09/15/2012 Time:  2100  Type of Therapy:  wrap up group  Participation Level:  Minimal  Participation Quality:  Appropriate, Attentive and Sharing  Affect:  Appropriate  Cognitive:  Appropriate  Insight:  Lacking  Engagement in Group:  Distracting and Engaged  Modes of Intervention:  Clarification, Education and Support  Summary of Progress/Problems: Pt was attentive and stayed through entire group but stood leaning over the back of a chair for last half of meeting. Pt was experiencing discomfort due to withdrawal and hemorrhoids.  Shelah Lewandowsky 09/16/2012, 2:23 AM

## 2012-09-16 NOTE — Progress Notes (Signed)
Adult Psychoeducational Group Note  Date:  09/16/2012 Time:  3:15PM  Group Topic/Focus:  Making Healthy Choices:   The focus of this group is to help patients identify negative/unhealthy choices they were using prior to admission and identify positive/healthier coping strategies to replace them upon discharge.  Participation Level:  Did Not Attend  Additional Comments:  Pt opted not to attend group stating that he had to take a shower.   Zacarias Pontes R 09/16/2012, 4:41 PM

## 2012-09-16 NOTE — BHH Counselor (Signed)
Adult Psychosocial Assessment Update Interdisciplinary Team  [facility] admissions[facility]discharges:  Admissions Discharges  Date: 07/03/12 Date: 07/09/12  Date: Date:  Date: Date:  Date: Date:  Date: Date:   Changes since the last Psychosocial Assessment (including adherence to outpatient mental health and/or substance abuse treatment, situational issues contributing to decompensation and/or relapse).  Patient discharged from [facility] June 2nd of this year and was transferred directly to  [facility] where he stayed for 34 days.  Since being discharged   From [facility], which patient reports was sudden and unexpected w/in 1 hour of being  Informed he returned to living on streets, in abandoned homes and soon began using  Multiple substances again, including smoking $60 of crack daily and between one half to  A full case daily. Patient became suicidal knowing he cannot go on like this and came to [facility] voluntarily.    Discharge Plan 1. Will you be returning to the same living situation after discharge?   Yes: No: X     If no, what is your plan?    Patient open to any plan that may give him some time to come up with a long range plan   Also interested in another treatment option in order to put together more clean time than   Detox.   2. Would you like a referral for services when you are discharged? Yes: X    If yes, for what services?  No:        Substance Abuse Treatment center       Summary and Recommendations (to be completed by the evaluator)  Patient is 52 YO single unemployed [place] American male admitted with diagnosis of   Alcohol Abuse and Major Depression, Recurrent severe. Patient will benefit from crisis  Stabilization, medication evaluation, group therapy and psycho education in addition to  Discharge planning.                  Signature:  Clide Dales, 09/16/2012 9:45 AM

## 2012-09-16 NOTE — BHH Group Notes (Signed)
B[facility] Group Notes:  (Nursing/MHT/Case Management/Adjunct)  Date:  09/16/2012  Time:  10:38 AM  Type of Therapy: Psychoeducational Skills   Participation Level: Active   Participation Quality: Appropriate   Affect: Appropriate   Cognitive: Appropriate   Insight: Appropriate   Engagement in Group: Engaged   Modes of Intervention: Problem-solving   Summary of Progress/Problems: Pt attended [employer] group, and engaged in treatment.   Jacquelyne Balint Shanta 09/16/2012, 10:38 AM

## 2012-09-16 NOTE — Progress Notes (Signed)
D  Pt complained of feeling poorly this morning    He reports poor sleep low appetite and inability to pay attention   He requested to be put back on cymbalta for his pain relief  He is compliant with treatment  He denies feeling suicidal but reports it comes and goes and he will contract for safety and get staff if feeling overwhelmed   Pt is somewhat noncompliant with his diabetes diet but has improved today   He still reports diarrhea and is receiving medications for same   Pt was put back on cymbalta   He has attended group this morning A   Verbal support given  Medications administered and effectiveness monitored  Q 15 min checks  Encourage compliance with diabetic diet and reinforced diet choices  R   Pt safe at present

## 2012-09-17 DIAGNOSIS — F39 Unspecified mood [affective] disorder: Secondary | ICD-10-CM

## 2012-09-17 LAB — GLUCOSE, CAPILLARY
Glucose-Capillary: 171 mg/dL — ABNORMAL HIGH (ref 70–99)
Glucose-Capillary: 230 mg/dL — ABNORMAL HIGH (ref 70–99)
Glucose-Capillary: 206 mg/dL — ABNORMAL HIGH (ref 70–99)

## 2012-09-17 NOTE — Progress Notes (Signed)
[facility] MD Progress Note  09/17/2012 2:48 PM Gabriel Rush  MRN:  [id number] Subjective:  Tc states he was able to sleep better last night. He states he needs help and that he is not going to be able to make it without it. Would like to clarify what happened at [facility] as states that he did nothing wrong. He is sill feeling that his mood is unstable. He is experiencing some anxiety as well a sense of hopelessness, helplessness Diagnosis:  Polysubstance Dependence, Mood Disorder NOS  ADL's:  Intact  Sleep: Fair  Appetite:  Fair  Suicidal Ideation:  Plan:  denies Intent:  denies Means:  denies Homicidal Ideation:  Plan:  denies Intent:  denies Means:  denies AEB (as evidenced by):  Psychiatric Specialty Exam: Review of Systems  Constitutional: Positive for malaise/fatigue.  HENT: Negative.   Eyes: Negative.   Respiratory: Negative.   Cardiovascular: Negative.   Gastrointestinal: Negative.   Genitourinary: Negative.   Musculoskeletal: Negative.   Skin: Negative.   Neurological: Positive for weakness.  Endo/Heme/Allergies: Negative.   Psychiatric/Behavioral: Positive for depression and substance abuse. The patient is nervous/anxious and has insomnia.     Blood pressure 122/83, pulse 104, temperature 97.4 F (36.3 C), temperature source Oral, resp. rate 16, height 5' 7.25" (1.708 m), weight 70.308 kg (155 lb).Body mass index is 24.1 kg/(m^2).  General Appearance: Fairly Groomed  Patent attorney::  Fair  Speech:  Clear and Coherent  Volume:  Normal  Mood:  Anxious and Depressed  Affect:  anxious, depressed  Thought Process:  Coherent and Goal Directed  Orientation:  Full (Time, Place, and Person)  Thought Content:  worries, concerns  Suicidal Thoughts:  No  Homicidal Thoughts:  No  Memory:  Immediate;   Fair Recent;   Fair Remote;   Fair  Judgement:  Fair  Insight:  Present  Psychomotor Activity:  Restlessness  Concentration:  Fair  Recall:  Fair  Akathisia:  No   Handed:  Right  AIMS (if indicated):     Assets:  Desire for Improvement  Sleep:  Number of Hours: 4   Current Medications: Current Facility-Administered Medications  Medication Dose Route Frequency Provider Last Rate Last Dose  . acetaminophen (TYLENOL) tablet 650 mg  650 mg Oral Q6H PRN Court Joy, PA-C      . alum & mag hydroxide-simeth (MAALOX/MYLANTA) 200-200-20 MG/5ML suspension 30 mL  30 mL Oral Q4H PRN Court Joy, PA-C      . chlordiazePOXIDE (LIBRIUM) capsule 25 mg  25 mg Oral Q6H PRN Court Joy, PA-C   25 mg at 09/16/12 2146  . chlordiazePOXIDE (LIBRIUM) capsule 25 mg  25 mg Oral BH-qamhs Court Joy, PA-C       Followed by  . [START ON 09/19/2012] chlordiazePOXIDE (LIBRIUM) capsule 25 mg  25 mg Oral Daily Court Joy, PA-C      . DULoxetine (CYMBALTA) DR capsule 30 mg  30 mg Oral Daily Rachael Fee, MD   30 mg at 09/17/12 0905  . feeding supplement (GLUCERNA SHAKE) liquid 237 mL  237 mL Oral BID BM Court Joy, PA-C   237 mL at 09/17/12 1441  . gabapentin (NEURONTIN) capsule 300 mg  300 mg Oral TID Court Joy, PA-C   300 mg at 09/17/12 1148  . hydrOXYzine (ATARAX/VISTARIL) tablet 25 mg  25 mg Oral Q6H PRN Court Joy, PA-C      . insulin aspart (novoLOG) injection 0-15 Units  0-15 Units Subcutaneous  TID WC Sanjuana Kava, NP   2 Units at 09/17/12 1154  . insulin aspart (novoLOG) injection 4 Units  4 Units Subcutaneous TID WC Sanjuana Kava, NP   4 Units at 09/17/12 1155  . insulin aspart protamine- aspart (NOVOLOG MIX 70/30) injection 40 Units  40 Units Subcutaneous BID WC Court Joy, PA-C   40 Units at 09/17/12 4098  . lipase/protease/amylase (CREON-12/PANCREASE) capsule 2 capsule  2 capsule Oral TID WC Court Joy, PA-C   2 capsule at 09/17/12 1148  . lisinopril (PRINIVIL,ZESTRIL) tablet 10 mg  10 mg Oral Daily Court Joy, PA-C   10 mg at 09/17/12 0905  . loperamide (IMODIUM) capsule 2-4 mg  2-4 mg Oral PRN Court Joy, PA-C    2 mg at 09/17/12 1338  . magnesium hydroxide (MILK OF MAGNESIA) suspension 30 mL  30 mL Oral Daily PRN Court Joy, PA-C      . metFORMIN (GLUCOPHAGE) tablet 500 mg  500 mg Oral BID WC Court Joy, PA-C   500 mg at 09/17/12 0906  . multivitamin with minerals tablet 1 tablet  1 tablet Oral Daily Court Joy, PA-C   1 tablet at 09/17/12 1191  . ondansetron (ZOFRAN-ODT) disintegrating tablet 4 mg  4 mg Oral Q6H PRN Court Joy, PA-C   4 mg at 09/15/12 2124  . pantoprazole (PROTONIX) EC tablet 40 mg  40 mg Oral BID AC Court Joy, PA-C   40 mg at 09/17/12 0610  . phenylephrine-shark liver oil-mineral oil-petrolatum (PREPARATION H) rectal ointment   Rectal BID PRN Rachael Fee, MD      . QUEtiapine (SEROQUEL) tablet 100 mg  100 mg Oral QHS Rachael Fee, MD   100 mg at 09/16/12 2146  . simvastatin (ZOCOR) tablet 20 mg  20 mg Oral q1800 Court Joy, PA-C   20 mg at 09/16/12 1720  . thiamine (B-1) injection 100 mg  100 mg Intramuscular Once Court Joy, PA-C      . thiamine (VITAMIN B-1) tablet 100 mg  100 mg Oral Daily Court Joy, PA-C   100 mg at 09/17/12 0906  . traZODone (DESYREL) tablet 100 mg  100 mg Oral QHS Court Joy, PA-C   100 mg at 09/16/12 2146    Lab Results:  Results for orders placed during the hospital encounter of 09/14/12 (from the past 48 hour(s))  GLUCOSE, CAPILLARY     Status: Abnormal   Collection Time    09/15/12  5:14 PM      Result Value Range   Glucose-Capillary 311 (*) 70 - 99 mg/dL  GLUCOSE, CAPILLARY     Status: Abnormal   Collection Time    09/15/12  9:12 PM      Result Value Range   Glucose-Capillary 160 (*) 70 - 99 mg/dL   Comment 1 Notify RN     Comment 2 Documented in Chart    GLUCOSE, CAPILLARY     Status: Abnormal   Collection Time    09/16/12 11:28 AM      Result Value Range   Glucose-Capillary 252 (*) 70 - 99 mg/dL  GLUCOSE, CAPILLARY     Status: Abnormal   Collection Time    09/16/12  9:06 PM      Result Value  Range   Glucose-Capillary 123 (*) 70 - 99 mg/dL  GLUCOSE, CAPILLARY     Status: Abnormal   Collection Time    09/17/12  6:03 AM      Result Value Range   Glucose-Capillary 171 (*) 70 - 99 mg/dL    Physical Findings: AIMS: Facial and Oral Movements Muscles of Facial Expression: None, normal Lips and Perioral Area: None, normal Jaw: None, normal Tongue: None, normal,Extremity Movements Upper (arms, wrists, hands, fingers): None, normal Lower (legs, knees, ankles, toes): None, normal, Trunk Movements Neck, shoulders, hips: None, normal, Overall Severity Severity of abnormal movements (highest score from questions above): None, normal Incapacitation due to abnormal movements: None, normal Patient's awareness of abnormal movements (rate only patient's report): No Awareness, Dental Status Current problems with teeth and/or dentures?: No Does patient usually wear dentures?: No  CIWA:  CIWA-Ar Total: 3 COWS:     Treatment Plan Summary: Daily contact with patient to assess and evaluate symptoms and progress in treatment Medication management  Plan: Supportive approach/coping skills/relapse prevention           Reassess and address the co morbidities           Pursue detox           Optimize treatment for the Cymbalta Medical Decision Making Problem Points:  Review of psycho-social stressors (1) Data Points:  Review of medication regiment & side effects (2)  I certify that inpatient services furnished can reasonably be expected to improve the patient's condition.   Jaymien Landin A 09/17/2012, 2:48 PM

## 2012-09-17 NOTE — BHH Group Notes (Deleted)
B[facility] LCSW Aftercare Discharge Planning Group Note   09/17/2012 8:45 AM  Participation Quality:  Did Not Attend   Marvena Tally Horton, LCSWA 09/17/2012 9:46 AM    

## 2012-09-17 NOTE — Tx Team (Signed)
Interdisciplinary Treatment Plan Update (Adult)  Date: 09/17/2012  Time Reviewed:  9:45 AM  Progress in Treatment: Attending groups: Yes Participating in groups:  Yes Taking medication as prescribed:  Yes Tolerating medication:  Yes Family/Significant othe contact made: CSW assessing Patient understands diagnosis:  Yes Discussing patient identified problems/goals with staff:  Yes Medical problems stabilized or resolved:  Yes Denies suicidal/homicidal ideation: Yes Issues/concerns per patient self-inventory:  Yes Other:  New problem(s) identified: N/A  Discharge Plan or Barriers: CSW assessing for appropriate referrals.    Reason for Continuation of Hospitalization: Anxiety Depression Medication Stabilization  Comments: N/A  Estimated length of stay: 3-5 days  For review of initial/current patient goals, please see plan of care.  Attendees: Patient:     Family:     Physician:  Dr. Lugo 09/17/2012 10:21 AM   Nursing:   Donna Shimp, RN 09/17/2012 10:21 AM   Clinical Social Worker:  Denna Fryberger Horton, LCSWA 09/17/2012 10:21 AM   Other: Vivian Kent, RN 09/17/2012 10:21 AM   Other:  Heather Smart, LCSWA 09/17/2012 10:21 AM   Other:  Delora Sutton, care manager 09/17/2012 10:21 AM   Other:     Other:    Other:    Other:    Other:    Other:    Other:     Scribe for Treatment Team:   Horton, Milaya Hora Nicole, 09/17/2012 , 10:21 AM  

## 2012-09-17 NOTE — Progress Notes (Signed)
D:Pt rates depression as a 7 on 1-10 scale with 10 being the most depressed. Pt reports that he is physically feeling better with blood sugar being controlled while in the hospital.  A:Offered support, encouragement and 15 minute checks. R:Pt denies si and hi. Safety maintained on the unit

## 2012-09-17 NOTE — Progress Notes (Signed)
Adult Psychoeducational Group Note  Date:  09/17/2012 Time:  11:03 PM  Group Topic/Focus:  Goals Group:   The focus of this group is to help patients establish daily goals to achieve during treatment and discuss how the patient can incorporate goal setting into their daily lives to aide in recovery.  Participation Level:  Active  Participation Quality:  Appropriate  Affect:  Appropriate  Cognitive:  Appropriate  Insight: Appropriate  Engagement in Group:  Engaged  Modes of Intervention:  Discussion  Additional Comments:  Pt stated to day was pretty good, counselor help him find long tern placement and that made his day.  Aldona Lento 09/17/2012, 11:03 PMT

## 2012-09-17 NOTE — BHH Group Notes (Signed)
[facility] LCSW Aftercare Discharge Planning Group Note   09/17/2012 10:36 AM  Participation Quality:  Appropriate   Mood/Affect:  Appropriate  Depression Rating:  5  Anxiety Rating:  5  Thoughts of Suicide:  No Will you contract for safety?   Yes  Current AVH:  No  Plan for Discharge/Comments: Pt stated that he decided  Pt stated that he is not sure if he would like i/p or o/p. He  aftercare. He lives in [place] currently by himslef and reports that he has no mental health providers but had been to [place] a few times in the past. Pt was recently at [facility] according to his RN. Pt interested in learning more about [facility]. Pt also requested that CSW find him a pair of pants and shirt if possible, as he does not have clothing with him.   Transportation Means: unknown at this time.   Supports: no supports identified.   Smart, [employer]

## 2012-09-17 NOTE — BHH Group Notes (Signed)
BHH LCSW Group Therapy  09/17/2012  1:15 PM   Type of Therapy:  Group Therapy  Participation Level:  Active  Participation Quality:  Appropriate and Attentive  Affect:  Appropriate, Depressed  Cognitive:  Alert and Appropriate  Insight:  Developing/Improving and Engaged  Engagement in Therapy:  Developing/Improving and Engaged  Modes of Intervention:  Clarification, Confrontation, Discussion, Education, Exploration, Limit-setting, Orientation, Problem-solving, Rapport Building, Dance movement psychotherapist, Socialization and Support  Summary of Progress/Problems: Pt identified obstacles faced currently and processed barriers involved in overcoming these obstacles. Pt identified steps necessary for overcoming these obstacles and explored motivation (internal and external) for facing these difficulties head on. Pt further identified one area of concern in their lives and chose a goal to focus on for today.  Pt states that his biggest obstacles are housing, affording meds and staying on meds.  Pt explained that he continues to go through the cycle of doing well on meds but then stops because of not being able to afford them and becomes sick again.  Pt was able to process how this cycle affects his well being and problem solved how he can overcome these obstacles.  Pt actively participated and was engaged in group discussion.    Reyes Ivan, LCSWA 09/17/2012 2:40 PM

## 2012-09-17 NOTE — Progress Notes (Signed)
Adult Psychoeducational Group Note  Date:  09/17/2012 Time:  11:00AM Group Topic/Focus:  Self Care:   The focus of this group is to help patients understand the importance of self-care in order to improve or restore emotional, physical, spiritual, interpersonal, and financial health.  Participation Level:  Active  Participation Quality:  Appropriate and Attentive  Affect:  Appropriate  Cognitive:  Alert and Appropriate  Insight: Appropriate  Engagement in Group:  Engaged  Modes of Intervention:  Discussion  Additional Comments:  Pt. Was attentive and appropriate during today's group discussion. Pt. Shared that he need to improve on taking better healthy self care. Pt. Shared that he is always trying to get a hold to drugs and alchol. Pt. Stated that he is here to lean healthy ways to change. Pt stated that he is scared to change his environment.   Bing Plume D 09/17/2012, 1:45 PM

## 2012-09-18 DIAGNOSIS — F339 Major depressive disorder, recurrent, unspecified: Secondary | ICD-10-CM

## 2012-09-18 LAB — GLUCOSE, CAPILLARY: Glucose-Capillary: 195 mg/dL — ABNORMAL HIGH (ref 70–99)

## 2012-09-18 LAB — PHENYTOIN LEVEL, TOTAL: Phenytoin Lvl: <2.5 ug/mL — ABNORMAL LOW (ref 10.0–20.0)

## 2012-09-18 MED ORDER — LOPERAMIDE HCL 2 MG PO CAPS
2.0000 mg | ORAL_CAPSULE | ORAL | Status: DC | PRN
  Administered 2012-09-18 – 2012-09-19 (×4): 2 mg via ORAL

## 2012-09-18 NOTE — Progress Notes (Signed)
Recreation Therapy Notes  Date: 08.12.2014 Time: 3:00pm Location: [address] Dayroom  Group Topic: Problem Solving, Decision Making, Communication  Goal Area(s) Addresses:  Patient will effectively work in a team with other group members. Patient will verbalize skills needed to make activity successful.  Patient will verbalize ways to use skills used in group session post d/c.  Behavioral Response: Engaged, Appropriate  Intervention: Survival Scenario  Activity: Life Boat. Patients were instructed that they had charted a yacht for the afternoon with 15 guests of various socioeconomic status and social status (i.e: Materials engineer, Physician, Midwife). Patients were instructed they could save themselves, as well as 8 of the 15 guests, by boarding a life boat, as the yacht starts to sink half way through the trip.  Education: Customer service manager, Pharmacologist, Discharge Planning, Relapse Prevention, Recovery Process  Education Outcome: Acknowledges understanding  Clinical Observations/Feedback: Patient actively engaged in group session. Patient communicated opinion well to peer and was able to debate appropriately for individuals he wanted to put on life boat. Patient identified that he based his decisions off of need for survival, as well as emotion. Patient additionally agreed with peer who stated they selected people out of necessity. Patient verbalized understanding that skills used to determine who would be put on life boat could be used to build support system post d/c. Patient agreed that a healthy support system is part of the recovery process.   Gabriel Rush, LRT/CTRS   Gabriel Rush 09/18/2012 4:15 PM

## 2012-09-18 NOTE — Progress Notes (Signed)
D- Patient is out on the unit interacting with peers and attending groups.  His mood is bright and behavior appropriate. Continues to c/o loose stools, but there has been none observed.  Requesting and receiving prn immodium with moderate relief.  Denies SI and contracts for safety.  No a/v reported.  A- Support and encouragement given.  Diabetic diet teaching reviewed.  Continue POC and evaluation of treatment goals. Continue 15' checks for safety.  R- Safety maintained.

## 2012-09-18 NOTE — Progress Notes (Signed)
Pt is reporting anxiety at a level 8 out of 10. Pt is reporting some diarrhea. Pt was reordered Imodium. Pt was asleep when order was obtained. Writer will follow-up with pt in the am. This is this pt's 3rd loose stool since 1338 on 09/17/12. Writer verified pt's PTA dilantin dose. Pt reports 200 mg QHS. PA notified. A dilantin level was ordered for this morning.Afterwards pt reported being off his Dilantin for 30 days. Pt reports that his last seizure was about 2 months ago.  Pt is currently denying any SI/HI. Pt attended group. Pt observed interacting appropriately within the milieu. A: Writer administered scheduled medications to pt. Continued support and availability as needed was extended to this pt. Staff continue to monitor pt with q42min checks.  R: No adverse drug reactions noted. Pt receptive to treatment. Pt remains safe at this time.

## 2012-09-18 NOTE — BHH Group Notes (Signed)
[facility] LCSW Group Therapy  09/18/2012 2:21 PM  Type of Therapy:  Group Therapy  Participation Level:  Active  Participation Quality:  Appropriate  Affect:  Appropriate  Cognitive:  Alert  Insight:  Improving  Engagement in Therapy:  Improving  Modes of Intervention:  Discussion, Education, Socialization and Support  Summary of Progress/Problems: MHA Speaker came to talk about his personal journey with substance abuse and addiction. The pt processed ways by which to relate to the speaker. MHA speaker provided handouts and educational information pertaining to groups and services offered by the [facility]. Gabriel Rush came to group about 10 minutes late. He was attentive and engaged throughout the speaker's presentation. He listened as the speaker talked about his personal experiences with MI and SA. Gabriel Rush shows progress in the group setting AEB his ability to remain attentive throughout the entirety of group. He did not ask questions but looked through the [facility] pamphlets as the speaker reviewed various groups offered by this agency.    Smart, Gabriel Rush 09/18/2012, 2:21 PM

## 2012-09-18 NOTE — Progress Notes (Signed)
[facility] MD Progress Note  09/18/2012 2:38 PM Gabriel Rush  MRN:  [id number] Subjective:  Still not where he would like to be. Admits to the tiredness, weakness. His BS is better control. He states he would like to go to [facility] and continue to work on his recovery. He states that with all his medical complications does not feel he will make it if he was not to pursue long term treatment. States he is receptive to what ever we suggest Diagnosis:  Polysubstance Dependence, Major Depression recurrent  ADL's:  Intact  Sleep: Fair  Appetite:  getting there  Suicidal Ideation:  Plan:  denies Intent:  denies Means:  denies Homicidal Ideation:  Plan:  denies Intent:  denies Means:  denies AEB (as evidenced by):  Psychiatric Specialty Exam: Review of Systems  Constitutional: Positive for malaise/fatigue.  HENT: Negative.   Eyes: Negative.   Respiratory: Negative.   Cardiovascular: Negative.   Gastrointestinal: Negative.   Genitourinary: Negative.   Musculoskeletal: Positive for myalgias.       Neuropathy  Skin: Negative.   Neurological: Positive for weakness.  Endo/Heme/Allergies: Negative.   Psychiatric/Behavioral: Positive for substance abuse. The patient is nervous/anxious and has insomnia.     Blood pressure 130/94, pulse 91, temperature 97.9 F (36.6 C), temperature source Oral, resp. rate 18, height 5' 7.25" (1.708 m), weight 70.308 kg (155 lb).Body mass index is 24.1 kg/(m^2).  General Appearance: Disheveled  Eye Solicitor::  Fair  Speech:  Clear and Coherent, Slow and not spontaneous  Volume:  Decreased  Mood:  Anxious, Depressed and worried  Affect:  anxious, worried  Thought Process:  Coherent and Goal Directed  Orientation:  Full (Time, Place, and Person)  Thought Content:  worries, concerns, fears  Suicidal Thoughts:  No  Homicidal Thoughts:  No  Memory:  Immediate;   Fair Recent;   Fair Remote;   Fair  Judgement:  Fair  Insight:  Shallow  Psychomotor Activity:   Restlessness  Concentration:  Fair  Recall:  Fair  Akathisia:  No  Handed:  Right  AIMS (if indicated):     Assets:  Desire for Improvement  Sleep:  Number of Hours: 5.75   Current Medications: Current Facility-Administered Medications  Medication Dose Route Frequency Provider Last Rate Last Dose  . acetaminophen (TYLENOL) tablet 650 mg  650 mg Oral Q6H PRN Court Joy, PA-C      . alum & mag hydroxide-simeth (MAALOX/MYLANTA) 200-200-20 MG/5ML suspension 30 mL  30 mL Oral Q4H PRN Court Joy, PA-C      . [START ON 09/19/2012] chlordiazePOXIDE (LIBRIUM) capsule 25 mg  25 mg Oral Daily Court Joy, PA-C      . DULoxetine (CYMBALTA) DR capsule 30 mg  30 mg Oral Daily Rachael Fee, MD   30 mg at 09/18/12 8295  . feeding supplement (GLUCERNA SHAKE) liquid 237 mL  237 mL Oral BID BM Court Joy, PA-C   237 mL at 09/17/12 1441  . gabapentin (NEURONTIN) capsule 300 mg  300 mg Oral TID Court Joy, PA-C   300 mg at 09/18/12 1203  . insulin aspart (novoLOG) injection 0-15 Units  0-15 Units Subcutaneous TID WC Sanjuana Kava, NP   3 Units at 09/18/12 1203  . insulin aspart (novoLOG) injection 4 Units  4 Units Subcutaneous TID WC Sanjuana Kava, NP   4 Units at 09/18/12 1202  . insulin aspart protamine- aspart (NOVOLOG MIX 70/30) injection 40 Units  40 Units Subcutaneous BID  WC Court Joy, PA-C   40 Units at 09/18/12 [phone]  . lipase/protease/amylase (CREON-12/PANCREASE) capsule 2 capsule  2 capsule Oral TID WC Court Joy, PA-C   2 capsule at 09/18/12 1203  . lisinopril (PRINIVIL,ZESTRIL) tablet 10 mg  10 mg Oral Daily Court Joy, PA-C   10 mg at 09/18/12 9604  . loperamide (IMODIUM) capsule 2 mg  2 mg Oral PRN Kerry Hough, PA-C   2 mg at 09/18/12 1204  . magnesium hydroxide (MILK OF MAGNESIA) suspension 30 mL  30 mL Oral Daily PRN Court Joy, PA-C      . metFORMIN (GLUCOPHAGE) tablet 500 mg  500 mg Oral BID WC Court Joy, PA-C   500 mg at 09/18/12 5409  .  multivitamin with minerals tablet 1 tablet  1 tablet Oral Daily Court Joy, PA-C   1 tablet at 09/18/12 8119  . pantoprazole (PROTONIX) EC tablet 40 mg  40 mg Oral BID AC Court Joy, PA-C   40 mg at 09/18/12 0641  . phenylephrine-shark liver oil-mineral oil-petrolatum (PREPARATION H) rectal ointment   Rectal BID PRN Rachael Fee, MD      . QUEtiapine (SEROQUEL) tablet 100 mg  100 mg Oral QHS Rachael Fee, MD   100 mg at 09/17/12 2217  . simvastatin (ZOCOR) tablet 20 mg  20 mg Oral q1800 Court Joy, PA-C   20 mg at 09/17/12 1847  . thiamine (B-1) injection 100 mg  100 mg Intramuscular Once Court Joy, PA-C      . thiamine (VITAMIN B-1) tablet 100 mg  100 mg Oral Daily Court Joy, PA-C   100 mg at 09/18/12 1478  . traZODone (DESYREL) tablet 100 mg  100 mg Oral QHS Court Joy, PA-C   100 mg at 09/17/12 2217    Lab Results:  Results for orders placed during the hospital encounter of 09/14/12 (from the past 48 hour(s))  GLUCOSE, CAPILLARY     Status: Abnormal   Collection Time    09/16/12  5:10 PM      Result Value Range   Glucose-Capillary 193 (*) 70 - 99 mg/dL  GLUCOSE, CAPILLARY     Status: Abnormal   Collection Time    09/16/12  9:06 PM      Result Value Range   Glucose-Capillary 123 (*) 70 - 99 mg/dL  GLUCOSE, CAPILLARY     Status: Abnormal   Collection Time    09/17/12  6:03 AM      Result Value Range   Glucose-Capillary 171 (*) 70 - 99 mg/dL  GLUCOSE, CAPILLARY     Status: Abnormal   Collection Time    09/17/12 11:39 AM      Result Value Range   Glucose-Capillary 150 (*) 70 - 99 mg/dL   Comment 1 Notify RN    GLUCOSE, CAPILLARY     Status: Abnormal   Collection Time    09/17/12  5:08 PM      Result Value Range   Glucose-Capillary 230 (*) 70 - 99 mg/dL  GLUCOSE, CAPILLARY     Status: Abnormal   Collection Time    09/17/12  9:31 PM      Result Value Range   Glucose-Capillary 206 (*) 70 - 99 mg/dL  GLUCOSE, CAPILLARY     Status: Abnormal    Collection Time    09/18/12  6:02 AM      Result Value Range   Glucose-Capillary 214 (*)  70 - 99 mg/dL  PHENYTOIN LEVEL, TOTAL     Status: Abnormal   Collection Time    09/18/12  6:15 AM      Result Value Range   Phenytoin Lvl <2.5 (*) 10.0 - 20.0 ug/mL   Comment: Performed at [facility]  GLUCOSE, CAPILLARY     Status: Abnormal   Collection Time    09/18/12 12:00 PM      Result Value Range   Glucose-Capillary 160 (*) 70 - 99 mg/dL    Physical Findings: AIMS: Facial and Oral Movements Muscles of Facial Expression: None, normal Lips and Perioral Area: None, normal Jaw: None, normal Tongue: None, normal,Extremity Movements Upper (arms, wrists, hands, fingers): None, normal Lower (legs, knees, ankles, toes): None, normal, Trunk Movements Neck, shoulders, hips: None, normal, Overall Severity Severity of abnormal movements (highest score from questions above): None, normal Incapacitation due to abnormal movements: None, normal Patient's awareness of abnormal movements (rate only patient's report): No Awareness, Dental Status Current problems with teeth and/or dentures?: No Does patient usually wear dentures?: No  CIWA:  CIWA-Ar Total: 2 COWS:     Treatment Plan Summary: Daily contact with patient to assess and evaluate symptoms and progress in treatment Medication management  Plan: Supportive approach/coping skills/relapse prevention           Reassess and address the co morbidities           Optimize treatment with Cymbalta, consider increasing to 60 mg Medical Decision Making Problem Points:  Review of psycho-social stressors (1) Data Points:  Review of medication regiment & side effects (2)  I certify that inpatient services furnished can reasonably be expected to improve the patient's condition.   Gabriel Rush A 09/18/2012, 2:38 PM

## 2012-09-18 NOTE — Progress Notes (Signed)
Recreation Therapy Notes  Date: 08.12.2014 Time: 2:30pm Location:[address]  Group Topic: Animal Assisted Activities  Goal Area(s) Addresses:  Patient will interact appropriately with dog team.    Behavioral Response: Did not attend  Jearl Klinefelter, LRT/CTRS  Jearl Klinefelter 09/18/2012 4:51 PM

## 2012-09-18 NOTE — Progress Notes (Signed)
The focus of this group is to educate the patient on the purpose and policies of crisis stabilization and provide a format to answer questions about their admission.  The group details unit policies and expectations of patients while admitted.Patient is engaging and appropriate; patient set her goal for today

## 2012-09-19 LAB — GLUCOSE, CAPILLARY

## 2012-09-19 NOTE — Progress Notes (Signed)
Patient ID: Gabriel Rush, male   DOB: 1960/05/13, 52 y.o.   MRN: [id number] D: Patient mood/affect seemed sad. 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 Pt denies any withdrawal symptoms. Pt rated pain as 8 on a 0-10 scale. Pt denies SI/HI/AVH. Pt attended evening NA group and engaged in discussion. Pt denies any other needs or concerns.  Cooperative with assessment. No acute distressed noted at this time.   A: Met with pt 1:1. Medications administered as prescribed. Writer encouraged pt to discuss feelings. Pt encouraged to come to staff with any question or concerns. 15 minutes checks for safety.  R: Patient remains safe. He is complaint with medications and denies any adverse reaction. Continue current POC.

## 2012-09-19 NOTE — [facility] Group Notes (Signed)
[facility] LCSW Aftercare Discharge Planning Group Note   09/19/2012 8:45 AM  Participation Quality:  Alert and Appropriate   Mood/Affect:  Appropriate, Flat and Depressed  Depression Rating:  4-5  Anxiety Rating:  7  Thoughts of Suicide:  Pt denies SI/HI  Will you contract for safety?   Yes  Current AVH: Pt denies  Plan for Discharge/Comments:  Pt attended discharge planning group and actively participated in group.  CSW provided pt with today's workbook.  Pt states that his agitation is building up today but doesn't know why.  Pt states that his d/c plan is to stay with friends in [place], who he reports don't use and won't allow him to use.  Pt initially wanted further inpatient treatment but now is stating he wants outpatient.  CSW discussed [facility] as his option for medication management and therapy.  Pt reports leaving [facility] after 35 days of treatment recently due to them not helping him with his diabetes medication anymore.  Pt states that he doesn't want to go back there either.  CSW will refer pt to [facility].  No further needs voiced by pt at this time.    Transportation Means: Pt has access to transportation  Supports: No supports mentioned at this time  Reyes Ivan, LCSWA 09/19/2012 11:53 AM

## 2012-09-19 NOTE — Progress Notes (Signed)
Adult Psychoeducational Group Note  Date:  09/19/2012 Time:  6:39 PM  Group Topic/Focus:  Personal Choices and Values:   The focus of this group is to help patients assess and explore the importance of values in their lives, how their values affect their decisions, how they express their values and what opposes their expression.  Participation Level:  Active  Participation Quality:  Appropriate  Affect:  Appropriate  Cognitive:  Appropriate  Insight: Appropriate and Good  Engagement in Group:  Engaged  Modes of Intervention:  Discussion, Education and Support  Additional Comments:  Pt listened attentively to discussion on personal values and how to bring one's actions into alignment with his/her values. Pt defined a "value" as "what you hold first." Pt identified that he values "life." Pt later spontaneously realized and shared that his substance abuse behaviors were inconsistent with this value.  Reinaldo Raddle K 09/19/2012, 6:39 PM

## 2012-09-19 NOTE — Progress Notes (Signed)
Patient ID: Gabriel Rush, male   DOB: [birth year], 52 y.o.   MRN: [id number] He has been up and to parts of group. He has c/o body aches and indicated that he was having several withdrawal symptoms this AM. He was given prn tylenol that helped a little. He denies SI thoughts. He has been calm and cooperative today.

## 2012-09-19 NOTE — [facility] Group Notes (Signed)
[facility] LCSW Group Therapy  09/19/2012  1:15 PM   Type of Therapy:  Group Therapy  Participation Level:  Active  Participation Quality:  Appropriate and Attentive  Affect:  Appropriate, Depressed and Flat  Cognitive:  Alert and Appropriate  Insight:  Developing/Improving and Engaged  Engagement in Therapy:  Developing/Improving and Engaged  Modes of Intervention:  Clarification, Confrontation, Discussion, Education, Exploration, Limit-setting, Orientation, Problem-solving, Rapport Building, Dance movement psychotherapist, Socialization and Support  Summary of Progress/Problems: The topic for group today was emotional regulation.  This group focused on both positive and negative emotion identification and allowed group members to process ways to identify feelings, regulate negative emotions, and find healthy ways to manage internal/external emotions. Group members were asked to reflect on a time when their reaction to an emotion led to a negative outcome and explored how alternative responses using emotion regulation would have benefited them. Group members were also asked to discuss a time when emotion regulation was utilized when a negative emotion was experienced. Pt shared that has a hard time following rules and turns to substance use to cope with any negative emotions.  Pt was able to connect his substance use to his health issues, and how it does have an impact on his physical health.  Pt shared that he is not sure if he is ready to stop using yet, which is why he changed his mind to outpatient treatment today.  Pt was talkative and actively participated in group discussion.    Reyes Ivan, LCSWA 09/19/2012 2:42 PM

## 2012-09-19 NOTE — Tx Team (Signed)
Interdisciplinary Treatment Plan Update (Adult)  Date: 09/19/2012  Time Reviewed:  9:45 AM  Progress in Treatment: Attending groups: Yes Participating in groups:  Yes Taking medication as prescribed:  Yes Tolerating medication:  Yes Family/Significant othe contact made: No, pt refused Patient understands diagnosis:  Yes Discussing patient identified problems/goals with staff:  Yes Medical problems stabilized or resolved:  Yes Denies suicidal/homicidal ideation: Yes Issues/concerns per patient self-inventory:  Yes Other:  New problem(s) identified: N/A  Discharge Plan or Barriers: Pt wants to do outpatient services at this time.  CSW will refer pt to [facility] for medication management and therapy.    Reason for Continuation of Hospitalization: Anxiety Depression Medication Stabilization  Comments: N/A  Estimated length of stay: 2-3 days  For review of initial/current patient goals, please see plan of care.  Attendees: Patient:     Family:     Physician:  09/19/2012 10:20 AM   Nursing:   Burnetta Sabin, RN 09/19/2012 10:20 AM   Clinical Social Worker:  Reyes Ivan, LCSWA 09/19/2012 10:20 AM   Other: Roswell Miners, RN 09/19/2012 10:20 AM   Other:  Trula Slade, LCSWA 09/19/2012 10:20 AM   Other:  Tomasita Morrow, care coordinator 09/19/2012 10:20 AM  Other:  Serena Colonel, NP 09/19/2012 10:20 AM  Other:    Other:    Other:    Other:    Other:    Other:     Scribe for Treatment Team:   Carmina Miller, 09/19/2012 , 10:20 AM

## 2012-09-19 NOTE — Progress Notes (Signed)
Patient ID: Gabriel Rush, male   DOB: 1960-07-23, 52 y.o.   MRN: [id number] [facility] MD Progress Note  09/19/2012 5:01 PM Gabriel Rush  MRN:  [id number]  Subjective:  Gabriel Rush reports, "[facility] did me wrong. I was discharged from there because of my diabetes without any place to go. That messed me up. But I still need treatment. I was drinking for 1 month before I came in here this time. I still get the shakes. My bones and muscles hurt and my vision stays blurry. I guess all these will clear once I finish detoxing"".  Diagnosis:  Polysubstance Dependence, Major Depression recurrent  ADL's:  Intact  Sleep: Fair  Appetite:  getting there  Suicidal Ideation:  Plan:  denies Intent:  denies Means:  denies Homicidal Ideation:  Plan:  denies Intent:  denies Means:  denies AEB (as evidenced by):  Psychiatric Specialty Exam: Review of Systems  Constitutional: Positive for malaise/fatigue.  HENT: Negative.   Eyes: Negative.   Respiratory: Negative.   Cardiovascular: Negative.   Gastrointestinal: Negative.   Genitourinary: Negative.   Musculoskeletal: Positive for myalgias.       Neuropathy  Skin: Negative.   Neurological: Positive for weakness.  Endo/Heme/Allergies: Negative.   Psychiatric/Behavioral: Positive for substance abuse. The patient is nervous/anxious and has insomnia.     Blood pressure 117/80, pulse 109, temperature 97.5 F (36.4 C), temperature source Oral, resp. rate 17, height 5' 7.25" (1.708 m), weight 70.308 kg (155 lb).Body mass index is 24.1 kg/(m^2).  General Appearance: Disheveled  Eye Solicitor::  Fair  Speech:  Clear and Coherent, Slow and not spontaneous  Volume:  Decreased  Mood:  Anxious, Depressed and worried  Affect:  anxious, worried  Thought Process:  Coherent and Goal Directed  Orientation:  Full (Time, Place, and Person)  Thought Content:  worries, concerns, fears  Suicidal Thoughts:  No  Homicidal Thoughts:  No  Memory:  Immediate;    Fair Recent;   Fair Remote;   Fair  Judgement:  Fair  Insight:  Shallow  Psychomotor Activity:  Restlessness  Concentration:  Fair  Recall:  Fair  Akathisia:  No  Handed:  Right  AIMS (if indicated):     Assets:  Desire for Improvement  Sleep:  Number of Hours: 5.75   Current Medications: Current Facility-Administered Medications  Medication Dose Route Frequency Provider Last Rate Last Dose  . acetaminophen (TYLENOL) tablet 650 mg  650 mg Oral Q6H PRN Court Joy, PA-C   650 mg at 09/19/12 7829  . alum & mag hydroxide-simeth (MAALOX/MYLANTA) 200-200-20 MG/5ML suspension 30 mL  30 mL Oral Q4H PRN Court Joy, PA-C      . DULoxetine (CYMBALTA) DR capsule 30 mg  30 mg Oral Daily Rachael Fee, MD   30 mg at 09/19/12 0759  . feeding supplement (GLUCERNA SHAKE) liquid 237 mL  237 mL Oral BID BM Court Joy, PA-C   237 mL at 09/19/12 1036  . gabapentin (NEURONTIN) capsule 300 mg  300 mg Oral TID Court Joy, PA-C   300 mg at 09/19/12 1202  . insulin aspart (novoLOG) injection 0-15 Units  0-15 Units Subcutaneous TID WC Sanjuana Kava, NP   3 Units at 09/19/12 1201  . insulin aspart (novoLOG) injection 4 Units  4 Units Subcutaneous TID WC Sanjuana Kava, NP      . insulin aspart protamine- aspart (NOVOLOG MIX 70/30) injection 40 Units  40 Units Subcutaneous BID WC Court Joy, PA-C  40 Units at 09/19/12 0802  . lipase/protease/amylase (CREON-12/PANCREASE) capsule 2 capsule  2 capsule Oral TID WC Court Joy, PA-C   2 capsule at 09/19/12 1202  . lisinopril (PRINIVIL,ZESTRIL) tablet 10 mg  10 mg Oral Daily Court Joy, PA-C   10 mg at 09/19/12 0800  . loperamide (IMODIUM) capsule 2 mg  2 mg Oral PRN Kerry Hough, PA-C   2 mg at 09/18/12 1701  . magnesium hydroxide (MILK OF MAGNESIA) suspension 30 mL  30 mL Oral Daily PRN Court Joy, PA-C      . metFORMIN (GLUCOPHAGE) tablet 500 mg  500 mg Oral BID WC Court Joy, PA-C   500 mg at 09/19/12 0800  .  multivitamin with minerals tablet 1 tablet  1 tablet Oral Daily Court Joy, PA-C   1 tablet at 09/19/12 0800  . pantoprazole (PROTONIX) EC tablet 40 mg  40 mg Oral BID AC Court Joy, PA-C   40 mg at 09/19/12 1610  . phenylephrine-shark liver oil-mineral oil-petrolatum (PREPARATION H) rectal ointment   Rectal BID PRN Rachael Fee, MD      . QUEtiapine (SEROQUEL) tablet 100 mg  100 mg Oral QHS Rachael Fee, MD   100 mg at 09/18/12 2240  . simvastatin (ZOCOR) tablet 20 mg  20 mg Oral q1800 Court Joy, PA-C   20 mg at 09/18/12 1657  . thiamine (B-1) injection 100 mg  100 mg Intramuscular Once Court Joy, PA-C      . thiamine (VITAMIN B-1) tablet 100 mg  100 mg Oral Daily Court Joy, PA-C   100 mg at 09/19/12 0800  . traZODone (DESYREL) tablet 100 mg  100 mg Oral QHS Court Joy, PA-C   100 mg at 09/18/12 2240    Lab Results:  Results for orders placed during the hospital encounter of 09/14/12 (from the past 48 hour(s))  GLUCOSE, CAPILLARY     Status: Abnormal   Collection Time    09/17/12  5:08 PM      Result Value Range   Glucose-Capillary 230 (*) 70 - 99 mg/dL  GLUCOSE, CAPILLARY     Status: Abnormal   Collection Time    09/17/12  9:31 PM      Result Value Range   Glucose-Capillary 206 (*) 70 - 99 mg/dL  GLUCOSE, CAPILLARY     Status: Abnormal   Collection Time    09/18/12  6:02 AM      Result Value Range   Glucose-Capillary 214 (*) 70 - 99 mg/dL  PHENYTOIN LEVEL, TOTAL     Status: Abnormal   Collection Time    09/18/12  6:15 AM      Result Value Range   Phenytoin Lvl <2.5 (*) 10.0 - 20.0 ug/mL   Comment: Performed at [facility]  GLUCOSE, CAPILLARY     Status: Abnormal   Collection Time    09/18/12 12:00 PM      Result Value Range   Glucose-Capillary 160 (*) 70 - 99 mg/dL  GLUCOSE, CAPILLARY     Status: Abnormal   Collection Time    09/18/12  4:51 PM      Result Value Range   Glucose-Capillary 195 (*) 70 - 99 mg/dL    Physical  Findings: AIMS: Facial and Oral Movements Muscles of Facial Expression: None, normal Lips and Perioral Area: None, normal Jaw: None, normal Tongue: None, normal,Extremity Movements Upper (arms, wrists, hands, fingers): None, normal Lower (legs,  knees, ankles, toes): None, normal, Trunk Movements Neck, shoulders, hips: None, normal, Overall Severity Severity of abnormal movements (highest score from questions above): None, normal Incapacitation due to abnormal movements: None, normal Patient's awareness of abnormal movements (rate only patient's report): No Awareness, Dental Status Current problems with teeth and/or dentures?: No Does patient usually wear dentures?: No  CIWA:  CIWA-Ar Total: 2 COWS:     Treatment Plan Summary: Daily contact with patient to assess and evaluate symptoms and progress in treatment Medication management  Plan: Supportive approach/coping skills/relapse prevention  Reassess and address the co morbidities. Increased from 30 mg to 40 mg daily for depression. Discharge plans in progress. Continue current plan of care  Medical Decision Making Problem Points:  Review of psycho-social stressors (1) Data Points:  Review of medication regiment & side effects (2)  I certify that inpatient services furnished can reasonably be expected to improve the patient's condition.   Armandina Stammer I, PMHNP-BC 09/19/2012, 5:01 PM

## 2012-09-19 NOTE — Progress Notes (Signed)
Adult Psychoeducational Group Note  Date:  09/19/2012 Time:  8:59 PM  Group Topic/Focus:  NA group  Participation Level:  Active  Participation Quality:  Appropriate  Affect:  Appropriate  Cognitive:  Alert  Insight: Appropriate  Engagement in Group:  Engaged  Modes of Intervention:  Discussion  Additional Comments:    Flonnie Hailstone 09/19/2012, 8:59 PM

## 2012-09-19 NOTE — [facility] Group Notes (Signed)
Adult Psychoeducational Group Note  Date:  09/19/2012 Time:  4:09 AM  Group Topic/Focus:  Wrap-Up Group:   The focus of this group is to help patients review their daily goal of treatment and discuss progress on daily workbooks.  Participation Level:  Active  Participation Quality:  Appropriate  Affect:  Appropriate  Cognitive:  Appropriate  Insight: Appropriate  Engagement in Group:  Engaged  Modes of Intervention:  Education  Additional Comments:  Patient was very engaged in group and offered words of encouragement to other patients. Patient listened openly to the representatives of AA/NA.  Delia Chimes 09/19/2012, 4:09 AM

## 2012-09-19 NOTE — [facility] Suicide Risk Assessment (Signed)
[facility] Adult Inpatient Family/Significant Other Suicide Prevention Education  Suicide Prevention Education:   Patient Refusal for Family/Significant Other Suicide Prevention Education: The patient has refused to provide written consent for family/significant other to be provided Family/Significant Other Suicide Prevention Education during admission and/or prior to discharge.  Physician notified.  CSW provided suicide prevention information with patient.    The suicide prevention education provided includes the following:  Suicide risk factors  Suicide prevention and interventions  National Suicide Hotline telephone number  [facility] assessment telephone number  [facility] Emergency Assistance 911  [facility] and/or Residential Mobile Crisis Unit telephone number   Gabriel Rush, [place] 09/19/2012 9:34 AM

## 2012-09-19 NOTE — Progress Notes (Signed)
Patient ID: Gabriel Rush, male   DOB: 1960/08/18, 52 y.o.   MRN: [id number]   D:Pt observed sleeping in bed with eyes closed. RR even and unlabored. A: 1:1 observation continues for safety  R: pt remains safe

## 2012-09-19 NOTE — Progress Notes (Signed)
Adult Psychoeducational Group Note  Date:  09/19/2012 Time:  10:00am  Group Topic/Focus:  Therapeutic Activity  Participation Level:  Active  Participation Quality:  Appropriate and Attentive  Affect:  Appropriate  Cognitive:  Alert and Appropriate  Insight: Appropriate  Engagement in Group:  Engaged  Modes of Intervention:  Discussion and Education  Additional Comments:  Pt attended and participated in group and gave appropriate answers.  Shelly Bombard D 09/19/2012, 11:15 AM

## 2012-09-20 LAB — GLUCOSE, CAPILLARY: Glucose-Capillary: 147 mg/dL — ABNORMAL HIGH (ref 70–99)

## 2012-09-20 MED ORDER — LISINOPRIL 20 MG PO TABS
20.0000 mg | ORAL_TABLET | Freq: Every day | ORAL | Status: DC
  Administered 2012-09-21: 20 mg via ORAL
  Filled 2012-09-20 (×3): qty 1

## 2012-09-20 MED ORDER — LISINOPRIL 10 MG PO TABS
10.0000 mg | ORAL_TABLET | Freq: Once | ORAL | Status: AC
  Administered 2012-09-20: 10 mg via ORAL
  Filled 2012-09-20: qty 1

## 2012-09-20 NOTE — [facility] Group Notes (Signed)
[facility] LCSW Group Therapy  09/20/2012  1:15 PM   Type of Therapy:  Group Therapy  Participation Level:  Active  Participation Quality:  Appropriate and Attentive  Affect:  Appropriate  Cognitive:  Alert and Appropriate  Insight:  Developing/Improving and Engaged  Engagement in Therapy:  Developing/Improving and Engaged  Modes of Intervention:  Clarification, Confrontation, Discussion, Education, Exploration, Limit-setting, Orientation, Problem-solving, Rapport Building, Dance movement psychotherapist, Socialization and Support  Summary of Progress/Problems: Finding Balance in Life. Today's group focused on defining balance in one's own words, identifying things that can knock one off balance, and exploring healthy ways to maintain balance in life. Group members were asked to provide an example of a time when they felt off balance, describe how they handled that situation,and process healthier ways to regain balance in the future. Group members were asked to share the most important tool for maintaining balance that they learned while at [facility] and how they plan to apply this method after discharge.  Pt shared that negative people throw his balance off.  Pt was able to process his environmental triggers, discussing his neighborhood and what he sees everyday, and how this relates to his sobriety.  Pt states that he plans to put his time and effort into helping his aunt out vs being a burden when he returns home.  Pt actively participated and was engaged in group discussion.    Gabriel Rush, LCSWA 09/20/2012 2:23 PM

## 2012-09-20 NOTE — Progress Notes (Signed)
Recreation Therapy Notes  Date: 08.14.2014 Time: 3:10pm Location: [address] Dayroom  Group Topic: Communication, Team Building, Problem Solving  Goal Area(s) Addresses:  Patient will be able to recognize use of communication, team building and problem solving during course of group activity. Patient will verbalize need for communication, team building and problem solving to make group activity successful.  Patient will verbalize benefit of communication, team building and problem solving to post d/c goals.   Behavioral Response: Engaged, Appropriate, Sharing  Intervention: Team Building Activity  Activity: [employer]. Patients were given 11 spaghetti sticks and 8 marshmallows. In teams of 3 patients were asked to build the tallest tower possible.   Education: Discharge Planning, Relapse Prevention   Education Outcome: Acknowledges understanding   Clinical Observations/Feedback: Patient actively participated in group session. Patient made no contributions to opening discussion, but appeared to actively listen as she maintained appropriate eye contact with speaker. Patient actively participated in group activity, offering ideas for building structure and assisted in building process. Patient contributed to wrap up discussion, identifying team work and communication as skills necessary to make activity successful. Patient related communication skills to expressing himself and voicing his needs. Patient explained that when he is using he isolates, which prevented him from communicating with others. Patient verbalized understanding of how communication can aid in sobriety.    Marykay Lex Mylin Hirano, LRT/CTRS  Tonetta Napoles L 09/20/2012 7:05 PM

## 2012-09-20 NOTE — Progress Notes (Signed)
Patient ID: Gabriel Rush, male   DOB: Feb 14, 1960, 52 y.o.   MRN: [id number] He has been up and to groups interacting with peers and staff. His depression and hopelessness are at five, denies SI thoughts  Continues to report withdrawal  Symptoms of agitation, chilling and diarrhea, No prn medication requested. Also c/o headache blurred vision and blurred vision. Did say that he slept all night and that he felt better today.

## 2012-09-20 NOTE — Progress Notes (Signed)
Patient ID: Tanuj Mullens, male   DOB: 09-Apr-1960, 53 y.o.   MRN: [id number] Patient ID: Zaid Tomes, male   DOB: [birth year], 52 y.o.   MRN: [id number] [facility] MD Progress Note  09/20/2012 2:41 PM Shaiden Aldous  MRN:  [id number]  Subjective:  Aaliyah is endorsing improved mood. He says that his withdrawal symptoms are almost eased out completely. Is looking forward to getting discharged. He says he will be living with his aunt after discharge. He adds that this is a contingency plan in the sense that if he does drink alcohol or use drugs, the deal is off. He currently denies any SIHI.  Diagnosis:  Polysubstance Dependence, Major Depression recurrent  ADL's:  Intact  Sleep: Fair  Appetite:  getting there  Suicidal Ideation:  Plan:  denies Intent:  denies Means:  denies Homicidal Ideation:  Plan:  denies Intent:  denies Means:  denies AEB (as evidenced by):  Psychiatric Specialty Exam: Review of Systems  Constitutional: Positive for malaise/fatigue.  HENT: Negative.   Eyes: Negative.   Respiratory: Negative.   Cardiovascular: Negative.   Gastrointestinal: Negative.   Genitourinary: Negative.   Musculoskeletal: Positive for myalgias.       Neuropathy  Skin: Negative.   Neurological: Positive for weakness.  Endo/Heme/Allergies: Negative.   Psychiatric/Behavioral: Positive for substance abuse. The patient is nervous/anxious and has insomnia.     Blood pressure 136/91, pulse 118, temperature 98 F (36.7 C), temperature source Oral, resp. rate 16, height 5' 7.25" (1.708 m), weight 70.308 kg (155 lb).Body mass index is 24.1 kg/(m^2).  General Appearance: Disheveled  Eye Solicitor::  Fair  Speech:  Clear and Coherent, Slow and not spontaneous  Volume:  Normal  Mood:  "I feel better"  Affect:  Congruent with mood  Thought Process:  Coherent and Goal Directed  Orientation:  Full (Time, Place, and Person)  Thought Content:  Rumination  Suicidal Thoughts:  No  Homicidal  Thoughts:  No  Memory:  Immediate;   Fair Recent;   Fair Remote;   Fair  Judgement:  Fair  Insight:  Shallow  Psychomotor Activity:  Normal  Concentration:  Fair  Recall:  Fair  Akathisia:  No  Handed:  Right  AIMS (if indicated):     Assets:  Desire for Improvement  Sleep:  Number of Hours: 6.5   Current Medications: Current Facility-Administered Medications  Medication Dose Route Frequency Provider Last Rate Last Dose  . acetaminophen (TYLENOL) tablet 650 mg  650 mg Oral Q6H PRN Court Joy, PA-C   650 mg at 09/19/12 0865  . alum & mag hydroxide-simeth (MAALOX/MYLANTA) 200-200-20 MG/5ML suspension 30 mL  30 mL Oral Q4H PRN Court Joy, PA-C      . DULoxetine (CYMBALTA) DR capsule 30 mg  30 mg Oral Daily Rachael Fee, MD   30 mg at 09/20/12 0817  . feeding supplement (GLUCERNA SHAKE) liquid 237 mL  237 mL Oral BID BM Court Joy, PA-C   237 mL at 09/19/12 1036  . gabapentin (NEURONTIN) capsule 300 mg  300 mg Oral TID Court Joy, PA-C   300 mg at 09/20/12 1155  . insulin aspart (novoLOG) injection 0-15 Units  0-15 Units Subcutaneous TID WC Sanjuana Kava, NP   2 Units at 09/20/12 1156  . insulin aspart (novoLOG) injection 4 Units  4 Units Subcutaneous TID WC Sanjuana Kava, NP   4 Units at 09/20/12 1159  . insulin aspart protamine- aspart (NOVOLOG MIX 70/30) injection 40 Units  40 Units Subcutaneous BID WC Court Joy, PA-C   40 Units at 09/20/12 [phone]  . lipase/protease/amylase (CREON-12/PANCREASE) capsule 2 capsule  2 capsule Oral TID WC Court Joy, PA-C   2 capsule at 09/20/12 1155  . lisinopril (PRINIVIL,ZESTRIL) tablet 10 mg  10 mg Oral Once Sanjuana Kava, NP      . Melene Muller ON 09/21/2012] lisinopril (PRINIVIL,ZESTRIL) tablet 20 mg  20 mg Oral Daily Sanjuana Kava, NP      . loperamide (IMODIUM) capsule 2 mg  2 mg Oral PRN Kerry Hough, PA-C   2 mg at 09/19/12 2246  . magnesium hydroxide (MILK OF MAGNESIA) suspension 30 mL  30 mL Oral Daily PRN Court Joy, PA-C      . metFORMIN (GLUCOPHAGE) tablet 500 mg  500 mg Oral BID WC Court Joy, PA-C   500 mg at 09/20/12 1191  . multivitamin with minerals tablet 1 tablet  1 tablet Oral Daily Court Joy, PA-C   1 tablet at 09/20/12 4782  . pantoprazole (PROTONIX) EC tablet 40 mg  40 mg Oral BID AC Court Joy, PA-C   40 mg at 09/20/12 9562  . phenylephrine-shark liver oil-mineral oil-petrolatum (PREPARATION H) rectal ointment   Rectal BID PRN Rachael Fee, MD      . QUEtiapine (SEROQUEL) tablet 100 mg  100 mg Oral QHS Rachael Fee, MD   100 mg at 09/19/12 2246  . simvastatin (ZOCOR) tablet 20 mg  20 mg Oral q1800 Court Joy, PA-C   20 mg at 09/19/12 1720  . thiamine (B-1) injection 100 mg  100 mg Intramuscular Once Court Joy, PA-C      . thiamine (VITAMIN B-1) tablet 100 mg  100 mg Oral Daily Court Joy, PA-C   100 mg at 09/20/12 0817  . traZODone (DESYREL) tablet 100 mg  100 mg Oral QHS Court Joy, PA-C   100 mg at 09/19/12 2246    Lab Results:  Results for orders placed during the hospital encounter of 09/14/12 (from the past 48 hour(s))  GLUCOSE, CAPILLARY     Status: Abnormal   Collection Time    09/18/12  4:51 PM      Result Value Range   Glucose-Capillary 195 (*) 70 - 99 mg/dL  GLUCOSE, CAPILLARY     Status: Abnormal   Collection Time    09/19/12  6:31 AM      Result Value Range   Glucose-Capillary 151 (*) 70 - 99 mg/dL  GLUCOSE, CAPILLARY     Status: Abnormal   Collection Time    09/19/12 11:55 AM      Result Value Range   Glucose-Capillary 180 (*) 70 - 99 mg/dL   Comment 1 Notify RN    GLUCOSE, CAPILLARY     Status: Abnormal   Collection Time    09/19/12  5:11 PM      Result Value Range   Glucose-Capillary 166 (*) 70 - 99 mg/dL  GLUCOSE, CAPILLARY     Status: Abnormal   Collection Time    09/19/12  9:10 PM      Result Value Range   Glucose-Capillary 179 (*) 70 - 99 mg/dL  GLUCOSE, CAPILLARY     Status: Abnormal   Collection Time     09/20/12  6:22 AM      Result Value Range   Glucose-Capillary 144 (*) 70 - 99 mg/dL  GLUCOSE, CAPILLARY     Status: Abnormal  Collection Time    09/20/12 11:48 AM      Result Value Range   Glucose-Capillary 147 (*) 70 - 99 mg/dL    Physical Findings: AIMS: Facial and Oral Movements Muscles of Facial Expression: None, normal Lips and Perioral Area: None, normal Jaw: None, normal Tongue: None, normal,Extremity Movements Upper (arms, wrists, hands, fingers): None, normal Lower (legs, knees, ankles, toes): None, normal, Trunk Movements Neck, shoulders, hips: None, normal, Overall Severity Severity of abnormal movements (highest score from questions above): None, normal Incapacitation due to abnormal movements: None, normal Patient's awareness of abnormal movements (rate only patient's report): No Awareness, Dental Status Current problems with teeth and/or dentures?: No Does patient usually wear dentures?: No  CIWA:  CIWA-Ar Total: 2 COWS:     Treatment Plan Summary: Daily contact with patient to assess and evaluate symptoms and progress in treatment Medication management  Plan: Supportive approach/coping skills/relapse prevention  Reassess and address the co morbidities. Increased from 30 mg to 40 mg daily for depression. Discharge in am. Continue current plan of care  Medical Decision Making Problem Points:  Review of psycho-social stressors (1) Data Points:  Review of medication regiment & side effects (2)  I certify that inpatient services furnished can reasonably be expected to improve the patient's condition.   Armandina Stammer I, PMHNP-BC 09/20/2012, 2:41 PM

## 2012-09-20 NOTE — Progress Notes (Signed)
CBG - 184 at 1700 this evening

## 2012-09-20 NOTE — Progress Notes (Signed)
Adult Psychoeducational Group Note  Date:  09/20/2012 Time:  1:12 PM  Group Topic/Focus:  Rediscovering Joy:   The focus of this group is to explore various ways to relieve stress in a positive manner.  Participation Level:  Active  Participation Quality:  Appropriate and Attentive  Affect:  Appropriate  Cognitive:  Alert and Appropriate  Insight: Good  Engagement in Group:  Engaged  Modes of Intervention:  Discussion, Socialization and Support  Additional Comments:  Pt came to group and was interactive with the other members of group. Pt plans on incorporating more joy into his life by watching more sitcoms and volunteering with his local [employer].  Cathlean Cower 09/20/2012, 1:12 PM

## 2012-09-20 NOTE — Progress Notes (Signed)
Recreation Therapy Notes  Date: 08.14.2014 Time: 2:30pm Location: [address] Dayroom   Group Topic: Animal Assisted Activities  Goal Area(s) Addresses:  Patient will interact appropriately with dog team.    Behavioral Response: Appropriate.   Education: Engineer, petroleum.   Education Outcome: Acknowledges understanding  Clinical Observations/Feedback: Dog Team: Runner, broadcasting/film/video. Patient interacted appropriately with peer, dog team, LRT and MHT.   Marykay Lex Hebert Dooling, LRT/CTRS  Aneshia Jacquet L 09/20/2012 7:22 PM

## 2012-09-21 LAB — GLUCOSE, CAPILLARY
Glucose-Capillary: 184 mg/dL — ABNORMAL HIGH (ref 70–99)
Glucose-Capillary: 202 mg/dL — ABNORMAL HIGH (ref 70–99)
Glucose-Capillary: 174 mg/dL — ABNORMAL HIGH (ref 70–99)

## 2012-09-21 MED ORDER — METFORMIN HCL 500 MG PO TABS: 500.0000 mg | ORAL_TABLET | Freq: Two times a day (BID) | ORAL | Status: DC

## 2012-09-21 MED ORDER — SIMVASTATIN 10 MG PO TABS: 10.0000 mg | ORAL_TABLET | Freq: Every day | ORAL | Status: DC

## 2012-09-21 MED ORDER — INSULIN ASPART PROT & ASPART (70-30 MIX) 100 UNIT/ML ~~LOC~~ SUSP: 40.0000 [IU] | Freq: Two times a day (BID) | SUBCUTANEOUS | Status: DC

## 2012-09-21 MED ORDER — GABAPENTIN 300 MG PO CAPS: 300.0000 mg | ORAL_CAPSULE | Freq: Three times a day (TID) | ORAL | Status: DC

## 2012-09-21 MED ORDER — PANCRELIPASE (LIP-PROT-AMYL) 12000-38000 UNITS PO CPEP: 2.0000 | ORAL_CAPSULE | Freq: Three times a day (TID) | ORAL | Status: DC

## 2012-09-21 MED ORDER — ADULT MULTIVITAMIN W/MINERALS CH: 1.0000 | ORAL_TABLET | Freq: Every day | ORAL | Status: DC

## 2012-09-21 MED ORDER — TRAZODONE HCL 100 MG PO TABS: 100.0000 mg | ORAL_TABLET | Freq: Every day | ORAL | Status: DC

## 2012-09-21 MED ORDER — PHENYTOIN SODIUM EXTENDED 100 MG PO CAPS: 200.0000 mg | ORAL_CAPSULE | Freq: Every day | ORAL | Status: DC

## 2012-09-21 MED ORDER — LISINOPRIL 20 MG PO TABS: 20.0000 mg | ORAL_TABLET | Freq: Every day | ORAL | Status: DC

## 2012-09-21 MED ORDER — DULOXETINE HCL 30 MG PO CPEP: 30.0000 mg | ORAL_CAPSULE | Freq: Every day | ORAL | Status: DC

## 2012-09-21 MED ORDER — QUETIAPINE FUMARATE 100 MG PO TABS: 100.0000 mg | ORAL_TABLET | Freq: Every day | ORAL | Status: DC

## 2012-09-21 NOTE — [facility] Suicide Risk Assessment (Signed)
Suicide Risk Assessment  Discharge Assessment     Demographic Factors:  Male, Adolescent or young adult, Low socioeconomic status and Unemployed  Mental Status Per Nursing Assessment::   On Admission:  Suicidal ideation indicated by patient;Suicide plan;Intention to act on suicide plan;Belief that plan would result in death  Current Mental Status by Physician: Mental Status Examination: Patient appeared as per his stated age, casually dressed, and fairly groomed, and maintaining good eye contact. Patient has good mood and his affect was constricted. He has normal rate, rhythm, and volume of speech. His thought process is linear and goal directed. Patient has denied suicidal, homicidal ideations, intentions or plans. Patient has no evidence of auditory or visual hallucinations, delusions, and paranoia. Patient has fair insight judgment and impulse control.  Loss Factors: Financial problems/change in socioeconomic status  Historical Factors: Prior suicide attempts and Impulsivity  Risk Reduction Factors:   Sense of responsibility to family, Religious beliefs about death, Living with another person, especially a relative, Positive social support, Positive therapeutic relationship and Positive coping skills or problem solving skills  Continued Clinical Symptoms:  Depression:   Comorbid alcohol abuse/dependence Recent sense of peace/wellbeing Alcohol/Substance Abuse/Dependencies  Cognitive Features That Contribute To Risk:  Polarized thinking    Suicide Risk:  Minimal: No identifiable suicidal ideation.  Patients presenting with no risk factors but with morbid ruminations; may be classified as minimal risk based on the severity of the depressive symptoms  Discharge Diagnoses:   AXIS I:  Substance Induced Mood Disorder and Polysubstance dependence AXIS II:  Deferred AXIS III:   Past Medical History  Diagnosis Date  . Hypertension   . Pancreatitis   . Alcohol abuse   . Chronic  abdominal pain   . Alcoholism /alcohol abuse   . Neuromuscular disorder 07/02/2012    Diabetic neuropathy  . Hypercholesteremia   . Type II diabetes mellitus   . RUEAVWUJ(811.9)     "weekly" (08/13/2012)  . Grand mal     "maybe once/month" (08/13/2012)  . Arthritis     "hands & arms" (08/13/2012)  . Depression    AXIS IV:  economic problems, other psychosocial or environmental problems and problems related to social environment AXIS V:  61-70 mild symptoms  Plan Of Care/Follow-up recommendations:  Activity:  As tolerated Diet:  Regular  Is patient on multiple antipsychotic therapies at discharge:  No   Has Patient had three or more failed trials of antipsychotic monotherapy by history:  No  Recommended Plan for Multiple Antipsychotic Therapies: Not applicable  Nehemiah Settle., M.D. 09/21/2012, 11:44 AM

## 2012-09-21 NOTE — Progress Notes (Signed)
[facility] Adult Case Management Discharge Plan :  Will you be returning to the same living situation after discharge: Yes,  returning home with aunt At discharge, do you have transportation home?:Yes,  provided pt with bus pass Do you have the ability to pay for your medications:Yes,  access to meds  Release of information consent forms completed and in the chart;  Patient's signature needed at discharge.  Patient to Follow up at: Follow-up Information   Follow up with [facility] On 09/25/2012. (Walk in on this date for hospital discharge appointment.  Walk in clinic is Monday - Friday 8 am - 3 pm)    Contact information:   [address], [place] [address] Phone: [phone] Fax: [phone]      Patient denies SI/HI:   Yes,  denies SI/HI    Safety Planning and Suicide Prevention discussed:  Yes,  discussed with pt. Pt refused consent to contact family/friend.  See suicide prevention education note.   Carmina Miller 09/21/2012, 10:31 AM

## 2012-09-21 NOTE — Tx Team (Signed)
Interdisciplinary Treatment Plan Update (Adult)  Date: 09/21/2012  Time Reviewed:  9:45 AM  Progress in Treatment: Attending groups: Yes Participating in groups:  Yes Taking medication as prescribed:  Yes Tolerating medication:  Yes Family/Significant othe contact made: No, pt refused Patient understands diagnosis:  Yes Discussing patient identified problems/goals with staff:  Yes Medical problems stabilized or resolved:  Yes Denies suicidal/homicidal ideation: Yes Issues/concerns per patient self-inventory:  Yes Other:  New problem(s) identified: N/A  Discharge Plan or Barriers: Pt will follow up at [facility] for medication management and therapy.    Reason for Continuation of Hospitalization: Stable to d/c  Comments: N/A  Estimated length of stay: D/C today  For review of initial/current patient goals, please see plan of care.  Attendees: Patient:     Family:     Physician:   09/21/2012 10:36 AM   Nursing:   Nestor Ramp, RN 09/21/2012 10:36 AM   Clinical Social Worker:  Reyes Ivan, LCSWA 09/21/2012 10:36 AM   Other: Serena Colonel, NP 09/21/2012 10:36 AM   Other:  Trula Slade, LCSWA 09/21/2012 10:36 AM   Other:  Burnetta Sabin, RN 09/21/2012 10:36 AM  Other:     Other:    Other:    Other:    Other:    Other:    Other:     Scribe for Treatment Team:   Carmina Miller, 09/21/2012 , 10:36 AM

## 2012-09-21 NOTE — Progress Notes (Signed)
Patient ID: Gabriel Rush, male   DOB: 28-Jun-1960, 52 y.o.   MRN: [id number] Patient discharger per physician order; patient denies SI/HI and A/V hallucinations; patient given samples, prescriptions, and copy of AVS, and bus passes after it was reviewed; patient had no other questions or concerns at this time; patient verbalized and signed that she received all belongings; patient left the unit ambulatory

## 2012-09-21 NOTE — Discharge Summary (Signed)
Physician Discharge Summary Note  Patient:  Gabriel Rush is an 52 y.o., male MRN:  [id number] DOB:  1960/05/02 Patient phone:  [phone] (home)  Patient address:   [address] [place] [facility] [address],   Date of Admission:  09/14/2012 Date of Discharge: 09/21/12  Reason for Admission:  Drug detox  Discharge Diagnoses: Active Problems:   Polysubstance dependence   Substance induced mood disorder  Review of Systems  Constitutional: Negative.   HENT: Negative.   Eyes: Negative.   Respiratory: Negative.   Cardiovascular: Negative.   Gastrointestinal: Negative.   Genitourinary: Negative.   Musculoskeletal: Negative.   Skin: Negative.   Neurological: Negative.   Endo/Heme/Allergies: Negative.   Psychiatric/Behavioral: Positive for depression (Stabilized with medication prior to discharge) and substance abuse (Polysubstance abuse hx). Negative for suicidal ideas, hallucinations and memory loss. The patient is nervous/anxious (Stabilized with medication prior to discharge.) and has insomnia (Stabilized with medication prior to discharge).    Axis Diagnosis:   AXIS I:  Substance Induced Mood Disorder and Polysubstance dependence AXIS II:  Deferred AXIS III:   Past Medical History  Diagnosis Date  . Hypertension   . Pancreatitis   . Alcohol abuse   . Chronic abdominal pain   . Alcoholism /alcohol abuse   . Neuromuscular disorder 07/02/2012    Diabetic neuropathy  . Hypercholesteremia   . Type II diabetes mellitus   . QMVHQION(629.5)     "weekly" (08/13/2012)  . Grand mal     "maybe once/month" (08/13/2012)  . Arthritis     "hands & arms" (08/13/2012)  . Depression    AXIS IV:  economic problems, housing problems, occupational problems, other psychosocial or environmental problems and Substance abuse problems AXIS V:  63  Level of Care:  OP  Hospital Course: Last five days smoking a lot of crack, marijuna, drinking a case a day. States he is seeing shadows. Was  hearing voices, being paranoid. This is the worst it has been. He was here in May, went to [facility] did 34 days got and extension but they discharged him due ot his diabetes. States he was told they could not afford to keep getting his medications. He was discharged 'back to the streets." In July he started doing drugs again. He is homeelss, staying here and there. Admits to feeing hopeless, helpless, wanting to give up.  After admission assessment and evaluation, it was determined that Mr. [name] will need detoxification treatment to stabilize his system of alcohol intoxication and to combat the withdrawal symptoms of alcohol. And his discharge plans included a referral and an appointment to an outpatient psychiatric clinic here in [facility] for routine psychiatric follow-up care, medication management and continuation of substance abuse treatment. Mr. [name] was then started on Librium detoxification treatment protocol for his alcohol detoxification. He was also enrolled in group counseling sessions and activities where he was counseled, taughtand learned coping skills that should help him after discharge to cope better, manage his substance abuse problems to maintain a much longer sobriety.   Besides the detoxification protocol, patient also received Gabapentin 300 mg tid for anxiety, Duloxetine 30 mg Q daily for depression, Seroquel 100 mg Q bedtime and Trazodone100 mg Q bedtime for sleep. He was also enrolled and attended AA/NA meetings being offered and held on this unit. He has some previously existing and or identifiable medical conditions that required treatment and or monitoring. He received medication management for all those health issues as well. He was monitored closely for any potential  problems that may arise as a result of and or during detoxification treatment. Patient tolerated his treatment regimen and detoxification treatment without any significant adverse effects and or reactions  reported.  Patient attended treatment team meeting this am and met with the treatment team members. His reason for admission, present symptoms, substance abuse issues, response to treatment and discharge plans discussed. Patient endorsed that he is doing well and stable for discharge to pursue the next phase of his substance abuse treatment. It was agreed upon that he will continue substance abuse treatment treatment and routine psychiatric care at the [facility] clonic here in [place], [place]. He is made aware that this is a walk-in appointment between the hours of 08:00 and 09:00 am. The address, date, time and contact information for this clinic provided for patient in writing.   Upon discharge, patient adamantly denies suicidal, homicidal ideations, auditory, visual hallucinations, delusional thinking and or withdrawal symptoms. Patient left [facility] with all personal belongings in no apparent distress. He received 2 weeks worth supply samples of his discharge medications. Transportation per city bus, and bus fare/voucher provided by [facility].  Consults:  psychiatry  Significant Diagnostic Studies:  labs: CBC with diff, CMP, UDS, Toxicology tests, U/A  Discharge Vitals:   Blood pressure 148/98, pulse 100, temperature 98 F (36.7 C), temperature source Oral, resp. rate 18, height 5' 7.25" (1.708 m), weight 70.308 kg (155 lb). Body mass index is 24.1 kg/(m^2). Lab Results:   Results for orders placed during the hospital encounter of 09/14/12 (from the past 72 hour(s))  GLUCOSE, CAPILLARY     Status: Abnormal   Collection Time    09/18/12 12:00 PM      Result Value Range   Glucose-Capillary 160 (*) 70 - 99 mg/dL  GLUCOSE, CAPILLARY     Status: Abnormal   Collection Time    09/18/12  4:51 PM      Result Value Range   Glucose-Capillary 195 (*) 70 - 99 mg/dL  GLUCOSE, CAPILLARY     Status: Abnormal   Collection Time    09/19/12  6:31 AM      Result Value Range   Glucose-Capillary 151 (*) 70 - 99 mg/dL   GLUCOSE, CAPILLARY     Status: Abnormal   Collection Time    09/19/12 11:55 AM      Result Value Range   Glucose-Capillary 180 (*) 70 - 99 mg/dL   Comment 1 Notify RN    GLUCOSE, CAPILLARY     Status: Abnormal   Collection Time    09/19/12  5:11 PM      Result Value Range   Glucose-Capillary 166 (*) 70 - 99 mg/dL  GLUCOSE, CAPILLARY     Status: Abnormal   Collection Time    09/19/12  9:10 PM      Result Value Range   Glucose-Capillary 179 (*) 70 - 99 mg/dL  GLUCOSE, CAPILLARY     Status: Abnormal   Collection Time    09/20/12  6:22 AM      Result Value Range   Glucose-Capillary 144 (*) 70 - 99 mg/dL  GLUCOSE, CAPILLARY     Status: Abnormal   Collection Time    09/20/12 11:48 AM      Result Value Range   Glucose-Capillary 147 (*) 70 - 99 mg/dL  GLUCOSE, CAPILLARY     Status: Abnormal   Collection Time    09/20/12  5:07 PM      Result Value Range   Glucose-Capillary 184 (*)  70 - 99 mg/dL  GLUCOSE, CAPILLARY     Status: Abnormal   Collection Time    09/20/12 10:01 PM      Result Value Range   Glucose-Capillary 196 (*) 70 - 99 mg/dL  GLUCOSE, CAPILLARY     Status: Abnormal   Collection Time    09/21/12  6:25 AM      Result Value Range   Glucose-Capillary 202 (*) 70 - 99 mg/dL   Comment 1 Notify RN     Comment 2 Documented in Chart      Physical Findings: AIMS: Facial and Oral Movements Muscles of Facial Expression: None, normal Lips and Perioral Area: None, normal Jaw: None, normal Tongue: None, normal,Extremity Movements Upper (arms, wrists, hands, fingers): None, normal Lower (legs, knees, ankles, toes): None, normal, Trunk Movements Neck, shoulders, hips: None, normal, Overall Severity Severity of abnormal movements (highest score from questions above): None, normal Incapacitation due to abnormal movements: None, normal Patient's awareness of abnormal movements (rate only patient's report): No Awareness, Dental Status Current problems with teeth and/or  dentures?: No Does patient usually wear dentures?: No  CIWA:  CIWA-Ar Total: 0 COWS:     Psychiatric Specialty Exam: See Psychiatric Specialty Exam and Suicide Risk Assessment completed by Attending Physician prior to discharge.  Discharge destination:  Home  Is patient on multiple antipsychotic therapies at discharge:  No   Has Patient had three or more failed trials of antipsychotic monotherapy by history:  No  Recommended Plan for Multiple Antipsychotic Therapies: NA     Medication List    STOP taking these medications       feeding supplement Liqd     mirtazapine 15 MG tablet  Commonly known as:  REMERON     naproxen 500 MG tablet  Commonly known as:  NAPROSYN      TAKE these medications     Indication   DULoxetine 30 MG capsule  Commonly known as:  CYMBALTA  Take 1 capsule (30 mg total) by mouth daily. For depression   Indication:  Major Depressive Disorder, Neuropathic Pain     gabapentin 300 MG capsule  Commonly known as:  NEURONTIN  Take 1 capsule (300 mg total) by mouth 3 (three) times daily. For anxiety/pain control   Indication:  Agitation, Pain, Anxiety     insulin aspart protamine- aspart (70-30) 100 UNIT/ML injection  Commonly known as:  NOVOLOG MIX 70/30  Inject 0.4 mL (40 Units total) into the skin 2 (two) times daily with a meal. For diabetes management   Indication:  Type 2 Diabetes     lipase/protease/amylase 40981 UNITS Cpep capsule  Commonly known as:  CREON-12/PANCREASE  Take 2 capsules by mouth 3 (three) times daily. For pancrease problem   Indication:  Chronic pancreatitis     lisinopril 20 MG tablet  Commonly known as:  PRINIVIL,ZESTRIL  Take 1 tablet (20 mg total) by mouth daily. For high blood pressure   Indication:  High Blood Pressure     metFORMIN 500 MG tablet  Commonly known as:  GLUCOPHAGE  Take 1 tablet (500 mg total) by mouth 2 (two) times daily with a meal. For diabetes management   Indication:  Type 2 Diabetes      multivitamin with minerals Tabs tablet  Take 1 tablet by mouth daily. Vitamin supplement   Indication:  Low vitamin     phenytoin 100 MG ER capsule  Commonly known as:  DILANTIN  Take 2 capsules (200 mg total) by mouth at  bedtime. For seizure activities   Indication:  Simple Partial Seizures, Tonic-Clonic Seizures     QUEtiapine 100 MG tablet  Commonly known as:  SEROQUEL  Take 1 tablet (100 mg total) by mouth at bedtime. For mood control   Indication:  Trouble Sleeping, Mood control     simvastatin 10 MG tablet  Commonly known as:  ZOCOR  Take 1 tablet (10 mg total) by mouth at bedtime. For high cholesterol management   Indication:  Inherited Heterozygous Hypercholesterolemia, Inherited Homozygous Hypercholesterolemia, Type II B Hyperlipidemia     traZODone 100 MG tablet  Commonly known as:  DESYREL  Take 1 tablet (100 mg total) by mouth at bedtime. For sleep   Indication:  Trouble Sleeping       Follow-up Information   Follow up with [facility] On 09/25/2012. (Walk in on this date for hospital discharge appointment.  Walk in clinic is Monday - Friday 8 am - 3 pm)    Contact information:   [address] [place] [address] Phone: [phone] Fax: [phone]     Follow-up recommendations: Activity:  As tolerated Diet: As recommended by your primary care doctor. Keep all scheduled follow-up appointments as recommended.   Comments: Take all your medications as prescribed by your mental healthcare provider. Report any adverse effects and or reactions from your medicines to your outpatient provider promptly. Patient is instructed and cautioned to not engage in alcohol and or illegal drug use while on prescription medicines. In the event of worsening symptoms, patient is instructed to call the crisis hotline, 911 and or go to the nearest ED for appropriate evaluation and treatment of symptoms. Follow-up with your primary care provider for your other medical issues, concerns and  or health care needs.    Total Discharge Time:  Greater than 30 minutes.  Signed: Sanjuana Kava, PMHNP-BC, FNP 09/21/2012, 10:16 AM  Patient examined personally and developed treatment plan. Case discussed with treatment team. Reviewed the information documented and agree with the treatment plan.  Matayah Reyburn,JANARDHAHA R. 09/25/2012 4:31 PM

## 2012-09-21 NOTE — Progress Notes (Signed)
Adult Psychoeducational Group Note  Date:  09/21/2012 Time:  1000  Group Topic/Focus: Positivity   Participation Level:  Active  Participation Quality:  Appropriate and Attentive  Affect:  Appropriate  Cognitive:  Alert and Appropriate  Insight: Good  Engagement in Group:  Developing/Improving  Modes of Intervention:  Discussion  Additional Comments:    Barton Fanny 09/21/2012, 11:07 AM

## 2012-09-21 NOTE — B[facility] Group Notes (Signed)
[facility] LCSW Aftercare Discharge Planning Group Note   09/21/2012 8:45 AM  Participation Quality:  Alert and Appropriate   Mood/Affect:  Appropriate and Calm  Depression Rating:  0  Anxiety Rating:  3  Thoughts of Suicide:  Pt denies SI/HI  Will you contract for safety?   Yes  Current AVH: Pt denies  Plan for Discharge/Comments:  Pt attended discharge planning group and actively participated in group.  CSW provided pt with today's workbook.  Pt reports feeling stable to d/c today.  Pt states that [place] and stay with his aunt, where he plans to help her out.  Pt continues to deny need for long term treatment.  Pt has follow up scheduled at [facility] for medication management and therapy.  No further needs voiced by pt at this time.    Transportation Means: Pt has access to transportation - provided pt with bus pass  Supports: No supports mentioned at this time  Gabriel Rush, LCSWA 09/21/2012 10:02 AM

## 2012-09-24 NOTE — Progress Notes (Signed)
Patient Discharge Instructions:  After Visit Summary (AVS):   Faxed to:  09/24/12 Discharge Summary Note:   Faxed to:  09/24/12 Psychiatric Admission Assessment Note:   Faxed to:  09/24/12 Suicide Risk Assessment - Discharge Assessment:   Faxed to:  09/24/12  Tonna Corner, 09/24/2012, 5:53 PM  Faxed to [facility] at [phone]

## 2012-12-04 ENCOUNTER — Inpatient Hospital Stay (HOSPITAL_COMMUNITY)
Admission: EM | Admit: 2012-12-04 | Discharge: 2012-12-05 | DRG: 638 | Disposition: A | Discharge status: Home / Self Care | Payer: Self-pay | Attending: Internal Medicine | Admitting: Internal Medicine

## 2012-12-04 ENCOUNTER — Encounter (HOSPITAL_COMMUNITY): Payer: Self-pay | Admitting: Emergency Medicine

## 2012-12-04 DIAGNOSIS — F1910 Other psychoactive substance abuse, uncomplicated: Secondary | ICD-10-CM | POA: Diagnosis present

## 2012-12-04 DIAGNOSIS — Z794 Long term (current) use of insulin: Secondary | ICD-10-CM

## 2012-12-04 DIAGNOSIS — F329 Major depressive disorder, single episode, unspecified: Secondary | ICD-10-CM | POA: Diagnosis present

## 2012-12-04 DIAGNOSIS — M129 Arthropathy, unspecified: Secondary | ICD-10-CM | POA: Diagnosis present

## 2012-12-04 DIAGNOSIS — E1100 Type 2 diabetes mellitus with hyperosmolarity without nonketotic hyperglycemic-hyperosmolar coma (NKHHC): Principal | ICD-10-CM | POA: Diagnosis present

## 2012-12-04 DIAGNOSIS — E1142 Type 2 diabetes mellitus with diabetic polyneuropathy: Secondary | ICD-10-CM | POA: Diagnosis present

## 2012-12-04 DIAGNOSIS — D649 Anemia, unspecified: Secondary | ICD-10-CM | POA: Diagnosis present

## 2012-12-04 DIAGNOSIS — F1994 Other psychoactive substance use, unspecified with psychoactive substance-induced mood disorder: Secondary | ICD-10-CM | POA: Diagnosis present

## 2012-12-04 DIAGNOSIS — E871 Hypo-osmolality and hyponatremia: Secondary | ICD-10-CM | POA: Diagnosis present

## 2012-12-04 DIAGNOSIS — E7800 Pure hypercholesterolemia, unspecified: Secondary | ICD-10-CM | POA: Diagnosis present

## 2012-12-04 DIAGNOSIS — E46 Unspecified protein-calorie malnutrition: Secondary | ICD-10-CM | POA: Diagnosis present

## 2012-12-04 DIAGNOSIS — E1110 Type 2 diabetes mellitus with ketoacidosis without coma: Secondary | ICD-10-CM

## 2012-12-04 DIAGNOSIS — Z91199 Patient's noncompliance with other medical treatment and regimen due to unspecified reason: Secondary | ICD-10-CM

## 2012-12-04 DIAGNOSIS — Z23 Encounter for immunization: Secondary | ICD-10-CM

## 2012-12-04 DIAGNOSIS — K861 Other chronic pancreatitis: Secondary | ICD-10-CM | POA: Diagnosis present

## 2012-12-04 DIAGNOSIS — G40309 Generalized idiopathic epilepsy and epileptic syndromes, not intractable, without status epilepticus: Secondary | ICD-10-CM | POA: Diagnosis present

## 2012-12-04 DIAGNOSIS — IMO0002 Reserved for concepts with insufficient information to code with codable children: Secondary | ICD-10-CM

## 2012-12-04 DIAGNOSIS — R569 Unspecified convulsions: Secondary | ICD-10-CM | POA: Diagnosis present

## 2012-12-04 DIAGNOSIS — F3289 Other specified depressive episodes: Secondary | ICD-10-CM | POA: Diagnosis present

## 2012-12-04 DIAGNOSIS — Z9119 Patient's noncompliance with other medical treatment and regimen: Secondary | ICD-10-CM

## 2012-12-04 DIAGNOSIS — Z87891 Personal history of nicotine dependence: Secondary | ICD-10-CM

## 2012-12-04 DIAGNOSIS — F1920 Other psychoactive substance dependence, uncomplicated: Secondary | ICD-10-CM

## 2012-12-04 DIAGNOSIS — E876 Hypokalemia: Secondary | ICD-10-CM | POA: Diagnosis present

## 2012-12-04 DIAGNOSIS — F1010 Alcohol abuse, uncomplicated: Secondary | ICD-10-CM | POA: Diagnosis present

## 2012-12-04 DIAGNOSIS — E1149 Type 2 diabetes mellitus with other diabetic neurological complication: Secondary | ICD-10-CM | POA: Diagnosis present

## 2012-12-04 DIAGNOSIS — E1165 Type 2 diabetes mellitus with hyperglycemia: Secondary | ICD-10-CM

## 2012-12-04 DIAGNOSIS — F1410 Cocaine abuse, uncomplicated: Secondary | ICD-10-CM | POA: Diagnosis present

## 2012-12-04 DIAGNOSIS — I10 Essential (primary) hypertension: Secondary | ICD-10-CM | POA: Diagnosis present

## 2012-12-04 HISTORY — DX: Type 2 diabetes mellitus with diabetic polyneuropathy: E11.42

## 2012-12-04 HISTORY — DX: Anxiety disorder, unspecified: F41.9

## 2012-12-04 LAB — CBC
Hemoglobin: 10.5 g/dL — ABNORMAL LOW (ref 13.0–17.0)
MCH: 30 pg (ref 26.0–34.0)
MCH: 29.7 pg (ref 26.0–34.0)
Platelets: 210 10*3/uL (ref 150–400)
Platelets: 209 10*3/uL (ref 150–400)
RBC: 3.53 MIL/uL — ABNORMAL LOW (ref 4.22–5.81)
RBC: 3.53 MIL/uL — ABNORMAL LOW (ref 4.22–5.81)
RDW: 12.7 % (ref 11.5–15.5)
WBC: 4.2 10*3/uL (ref 4.0–10.5)
WBC: 3.9 10*3/uL — ABNORMAL LOW (ref 4.0–10.5)

## 2012-12-04 LAB — BASIC METABOLIC PANEL
BUN: 4 mg/dL — ABNORMAL LOW (ref 6–23)
BUN: 4 mg/dL — ABNORMAL LOW (ref 6–23)
CO2: 20 mEq/L (ref 19–32)
Calcium: 7.8 mg/dL — ABNORMAL LOW (ref 8.4–10.5)
Calcium: 7.8 mg/dL — ABNORMAL LOW (ref 8.4–10.5)
Calcium: 8 mg/dL — ABNORMAL LOW (ref 8.4–10.5)
Calcium: 8.1 mg/dL — ABNORMAL LOW (ref 8.4–10.5)
Creatinine, Ser: 0.63 mg/dL (ref 0.50–1.35)
Creatinine, Ser: 0.64 mg/dL (ref 0.50–1.35)
Creatinine, Ser: 0.62 mg/dL (ref 0.50–1.35)
GFR calc Af Amer: >90 mL/min (ref >90)
GFR calc Af Amer: >90 mL/min (ref >90)
GFR calc Af Amer: >90 mL/min (ref >90)
GFR calc Af Amer: >90 mL/min (ref >90)
GFR calc non Af Amer: >90 mL/min (ref >90)
GFR calc non Af Amer: >90 mL/min (ref >90)
GFR calc non Af Amer: >90 mL/min (ref >90)
GFR calc non Af Amer: >90 mL/min (ref >90)
Glucose, Bld: 400 mg/dL — ABNORMAL HIGH (ref 70–99)
Glucose, Bld: 321 mg/dL — ABNORMAL HIGH (ref 70–99)
Potassium: 3.2 mEq/L — ABNORMAL LOW (ref 3.5–5.1)
Potassium: 3.1 mEq/L — ABNORMAL LOW (ref 3.5–5.1)
Sodium: 134 mEq/L — ABNORMAL LOW (ref 135–145)
Sodium: 135 mEq/L (ref 135–145)

## 2012-12-04 LAB — GLUCOSE, CAPILLARY
Glucose-Capillary: 289 mg/dL — ABNORMAL HIGH (ref 70–99)
Glucose-Capillary: 309 mg/dL — ABNORMAL HIGH (ref 70–99)
Glucose-Capillary: 334 mg/dL — ABNORMAL HIGH (ref 70–99)
Glucose-Capillary: 406 mg/dL — ABNORMAL HIGH (ref 70–99)
Glucose-Capillary: 420 mg/dL — ABNORMAL HIGH (ref 70–99)
Glucose-Capillary: 66 mg/dL — ABNORMAL LOW (ref 70–99)
Glucose-Capillary: 73 mg/dL (ref 70–99)

## 2012-12-04 LAB — URINALYSIS, ROUTINE W REFLEX MICROSCOPIC
Bilirubin Urine: NEGATIVE
Ketones, ur: 15 mg/dL — AB
Leukocytes, UA: NEGATIVE
Nitrite: NEGATIVE
Urobilinogen, UA: 0.2 mg/dL (ref 0.0–1.0)
pH: 6 (ref 5.0–8.0)

## 2012-12-04 LAB — COMPREHENSIVE METABOLIC PANEL
ALT: 20 U/L (ref 0–53)
Alkaline Phosphatase: 111 U/L (ref 39–117)
BUN: 5 mg/dL — ABNORMAL LOW (ref 6–23)
CO2: 23 mEq/L (ref 19–32)
Calcium: 9.1 mg/dL (ref 8.4–10.5)
GFR calc Af Amer: >90 mL/min (ref >90)
GFR calc non Af Amer: >90 mL/min (ref >90)
Glucose, Bld: 707 mg/dL (ref 70–99)
Potassium: 3 mEq/L — ABNORMAL LOW (ref 3.5–5.1)
Sodium: 123 mEq/L — ABNORMAL LOW (ref 135–145)
Total Protein: 7.2 g/dL (ref 6.0–8.3)

## 2012-12-04 LAB — CBC WITH DIFFERENTIAL/PLATELET
Eosinophils Absolute: 0 10*3/uL (ref 0.0–0.7)
Eosinophils Relative: 0 % (ref 0–5)
Lymphocytes Relative: 34 % (ref 12–46)
Lymphs Abs: 1.6 10*3/uL (ref 0.7–4.0)
MCHC: 36.5 g/dL — ABNORMAL HIGH (ref 30.0–36.0)
Monocytes Relative: 7 % (ref 3–12)
Neutro Abs: 2.8 10*3/uL (ref 1.7–7.7)
Neutrophils Relative %: 59 % (ref 43–77)
Platelets: 320 10*3/uL (ref 150–400)
RDW: 12.8 % (ref 11.5–15.5)

## 2012-12-04 LAB — ETHANOL: Alcohol, Ethyl (B): <11 mg/dL (ref 0–11)

## 2012-12-04 LAB — HEMOGLOBIN A1C
Hgb A1c MFr Bld: 13.3 % — ABNORMAL HIGH (ref <5.7)
Mean Plasma Glucose: 335 mg/dL — ABNORMAL HIGH (ref <117)

## 2012-12-04 MED ORDER — LISINOPRIL 20 MG PO TABS
20.0000 mg | ORAL_TABLET | Freq: Every day | ORAL | Status: DC
  Administered 2012-12-04: 20 mg via ORAL
  Filled 2012-12-04 (×2): qty 1

## 2012-12-04 MED ORDER — LORAZEPAM 1 MG PO TABS: 0.0000 mg | ORAL_TABLET | Freq: Four times a day (QID) | ORAL | Status: DC

## 2012-12-04 MED ORDER — TRAZODONE HCL 100 MG PO TABS
100.0000 mg | ORAL_TABLET | Freq: Every day | ORAL | Status: DC
  Administered 2012-12-04: 100 mg via ORAL
  Filled 2012-12-04 (×2): qty 1

## 2012-12-04 MED ORDER — ENOXAPARIN SODIUM 40 MG/0.4ML ~~LOC~~ SOLN
40.0000 mg | SUBCUTANEOUS | Status: DC
  Administered 2012-12-04 – 2012-12-05 (×2): 40 mg via SUBCUTANEOUS
  Filled 2012-12-04 (×2): qty 0.4

## 2012-12-04 MED ORDER — PANCRELIPASE (LIP-PROT-AMYL) 12000-38000 UNITS PO CPEP
2.0000 | ORAL_CAPSULE | Freq: Three times a day (TID) | ORAL | Status: DC
  Administered 2012-12-04 – 2012-12-05 (×5): 2 via ORAL
  Filled 2012-12-04 (×6): qty 2

## 2012-12-04 MED ORDER — POTASSIUM CHLORIDE CRYS ER 20 MEQ PO TBCR
40.0000 meq | EXTENDED_RELEASE_TABLET | Freq: Two times a day (BID) | ORAL | Status: AC
  Administered 2012-12-04 (×2): 40 meq via ORAL
  Filled 2012-12-04 (×2): qty 2

## 2012-12-04 MED ORDER — HYDROCODONE-ACETAMINOPHEN 5-325 MG PO TABS
1.0000 | ORAL_TABLET | ORAL | Status: DC | PRN
  Administered 2012-12-04 (×3): 2 via ORAL
  Administered 2012-12-04: 1 via ORAL
  Administered 2012-12-05: 2 via ORAL
  Filled 2012-12-04 (×2): qty 2
  Filled 2012-12-04: qty 1
  Filled 2012-12-04 (×2): qty 2

## 2012-12-04 MED ORDER — SODIUM CHLORIDE 0.9 % IV SOLN
INTRAVENOUS | Status: DC
  Administered 2012-12-04: 05:00:00 via INTRAVENOUS
  Administered 2012-12-04: 100 mL/h via INTRAVENOUS
  Administered 2012-12-05: 04:00:00 via INTRAVENOUS

## 2012-12-04 MED ORDER — DIPHENHYDRAMINE HCL 50 MG/ML IJ SOLN
12.5000 mg | Freq: Four times a day (QID) | INTRAMUSCULAR | Status: DC | PRN
  Administered 2012-12-04: 12.5 mg via INTRAVENOUS
  Filled 2012-12-04: qty 1

## 2012-12-04 MED ORDER — INFLUENZA VAC SPLIT QUAD 0.5 ML IM SUSP
0.5000 mL | INTRAMUSCULAR | Status: DC
  Filled 2012-12-04 (×3): qty 0.5

## 2012-12-04 MED ORDER — SODIUM CHLORIDE 0.9 % IV SOLN: INTRAVENOUS | Status: DC

## 2012-12-04 MED ORDER — ACETAMINOPHEN 650 MG RE SUPP: 650.0000 mg | Freq: Four times a day (QID) | RECTAL | Status: DC | PRN

## 2012-12-04 MED ORDER — ONDANSETRON HCL 4 MG PO TABS: 4.0000 mg | ORAL_TABLET | Freq: Four times a day (QID) | ORAL | Status: DC | PRN

## 2012-12-04 MED ORDER — POTASSIUM CHLORIDE 10 MEQ/100ML IV SOLN
10.0000 meq | INTRAVENOUS | Status: DC
  Administered 2012-12-04: 10 meq via INTRAVENOUS
  Filled 2012-12-04 (×2): qty 100

## 2012-12-04 MED ORDER — ACETAMINOPHEN 325 MG PO TABS: 650.0000 mg | ORAL_TABLET | Freq: Four times a day (QID) | ORAL | Status: DC | PRN

## 2012-12-04 MED ORDER — SODIUM CHLORIDE 0.9 % IV BOLUS (SEPSIS)
2000.0000 mL | Freq: Once | INTRAVENOUS | Status: AC
  Administered 2012-12-04: 2000 mL via INTRAVENOUS

## 2012-12-04 MED ORDER — LORAZEPAM 2 MG/ML IJ SOLN: 1.0000 mg | Freq: Four times a day (QID) | INTRAMUSCULAR | Status: DC | PRN

## 2012-12-04 MED ORDER — INSULIN ASPART PROT & ASPART (70-30 MIX) 100 UNIT/ML ~~LOC~~ SUSP
40.0000 [IU] | Freq: Once | SUBCUTANEOUS | Status: AC
  Administered 2012-12-04: 40 [IU] via SUBCUTANEOUS
  Filled 2012-12-04: qty 10

## 2012-12-04 MED ORDER — SODIUM CHLORIDE 0.9 % IV SOLN
INTRAVENOUS | Status: DC
  Administered 2012-12-04: 3.6 [IU]/h via INTRAVENOUS
  Filled 2012-12-04: qty 1

## 2012-12-04 MED ORDER — THIAMINE HCL 100 MG/ML IJ SOLN
100.0000 mg | Freq: Every day | INTRAMUSCULAR | Status: DC
  Administered 2012-12-04: 100 mg via INTRAVENOUS
  Filled 2012-12-04 (×2): qty 1

## 2012-12-04 MED ORDER — GABAPENTIN 300 MG PO CAPS
300.0000 mg | ORAL_CAPSULE | Freq: Three times a day (TID) | ORAL | Status: DC
  Administered 2012-12-04 – 2012-12-05 (×5): 300 mg via ORAL
  Filled 2012-12-04 (×6): qty 1

## 2012-12-04 MED ORDER — SIMVASTATIN 10 MG PO TABS
10.0000 mg | ORAL_TABLET | Freq: Every day | ORAL | Status: DC
  Administered 2012-12-04: 10 mg via ORAL
  Filled 2012-12-04 (×2): qty 1

## 2012-12-04 MED ORDER — ADULT MULTIVITAMIN W/MINERALS CH
1.0000 | ORAL_TABLET | Freq: Every day | ORAL | Status: DC
  Administered 2012-12-04 – 2012-12-05 (×2): 1 via ORAL
  Filled 2012-12-04 (×2): qty 1

## 2012-12-04 MED ORDER — LORAZEPAM 1 MG PO TABS: 0.0000 mg | ORAL_TABLET | Freq: Two times a day (BID) | ORAL | Status: DC

## 2012-12-04 MED ORDER — INSULIN ASPART 100 UNIT/ML ~~LOC~~ SOLN
0.0000 [IU] | Freq: Three times a day (TID) | SUBCUTANEOUS | Status: DC
  Administered 2012-12-04: 5 [IU] via SUBCUTANEOUS
  Administered 2012-12-05: 11 [IU] via SUBCUTANEOUS
  Administered 2012-12-05: 5 [IU] via SUBCUTANEOUS

## 2012-12-04 MED ORDER — ONDANSETRON HCL 4 MG/2ML IJ SOLN: 4.0000 mg | Freq: Four times a day (QID) | INTRAMUSCULAR | Status: DC | PRN

## 2012-12-04 MED ORDER — KETOROLAC TROMETHAMINE 30 MG/ML IJ SOLN
30.0000 mg | Freq: Once | INTRAMUSCULAR | Status: AC
  Administered 2012-12-04: 30 mg via INTRAVENOUS
  Filled 2012-12-04: qty 1

## 2012-12-04 MED ORDER — DULOXETINE HCL 30 MG PO CPEP
30.0000 mg | ORAL_CAPSULE | Freq: Every day | ORAL | Status: DC
  Administered 2012-12-04 – 2012-12-05 (×2): 30 mg via ORAL
  Filled 2012-12-04 (×2): qty 1

## 2012-12-04 MED ORDER — QUETIAPINE FUMARATE 100 MG PO TABS
100.0000 mg | ORAL_TABLET | Freq: Every day | ORAL | Status: DC
  Administered 2012-12-04: 100 mg via ORAL
  Filled 2012-12-04 (×2): qty 1

## 2012-12-04 MED ORDER — LORAZEPAM 1 MG PO TABS: 1.0000 mg | ORAL_TABLET | Freq: Four times a day (QID) | ORAL | Status: DC | PRN

## 2012-12-04 MED ORDER — FOLIC ACID 1 MG PO TABS
1.0000 mg | ORAL_TABLET | Freq: Every day | ORAL | Status: DC
  Administered 2012-12-04 – 2012-12-05 (×2): 1 mg via ORAL
  Filled 2012-12-04 (×2): qty 1

## 2012-12-04 MED ORDER — SODIUM CHLORIDE 0.9 % IJ SOLN
3.0000 mL | Freq: Two times a day (BID) | INTRAMUSCULAR | Status: DC
  Administered 2012-12-04: 3 mL via INTRAVENOUS

## 2012-12-04 MED ORDER — DEXTROSE 50 % IV SOLN: 25.0000 mL | INTRAVENOUS | Status: DC | PRN

## 2012-12-04 MED ORDER — POTASSIUM CHLORIDE 10 MEQ/100ML IV SOLN
10.0000 meq | INTRAVENOUS | Status: AC
  Administered 2012-12-04 (×2): 10 meq via INTRAVENOUS
  Filled 2012-12-04 (×4): qty 100

## 2012-12-04 MED ORDER — PHENYTOIN SODIUM EXTENDED 100 MG PO CAPS
200.0000 mg | ORAL_CAPSULE | Freq: Every day | ORAL | Status: DC
  Administered 2012-12-04: 200 mg via ORAL
  Filled 2012-12-04 (×2): qty 2

## 2012-12-04 NOTE — ED Notes (Signed)
Patient symptomatic with position changes. Explained that he feels extremely dizzy when sitting or standing. Required assistance while standing to maintain balance. MD informed.

## 2012-12-04 NOTE — ED Provider Notes (Signed)
CSN: [id number]     Arrival date & time 12/04/12  0019 History   First MD Initiated Contact with Patient 12/04/12 0209     Chief Complaint  Patient presents with  . Dizziness  . Leg Pain   (Consider location/radiation/quality/duration/timing/severity/associated sxs/prior Treatment) Patient is a 52 y.o. male presenting with hyperglycemia. The history is provided by the patient.  Hyperglycemia Blood sugar level PTA:  707 Severity:  Severe Onset quality:  Gradual Duration:  1 month Timing:  Constant Progression:  Worsening Chronicity:  Chronic Diabetes status:  Controlled with insulin and controlled with oral medications Context: noncompliance   Relieved by:  Nothing Ineffective treatments:  None tried Associated symptoms: dehydration, dizziness, polyuria and syncope   Associated symptoms: no chest pain, no fever, no increased appetite and no shortness of breath   Associated symptoms comment:  Neuropathic pain Risk factors: hx of DKA and pancreatic disease     Past Medical History  Diagnosis Date  . Hypertension   . Pancreatitis   . Alcohol abuse   . Chronic abdominal pain   . Alcoholism /alcohol abuse   . Neuromuscular disorder 07/02/2012    Diabetic neuropathy  . Hypercholesteremia   . Type II diabetes mellitus   . WUJWJXBJ(478.2)     "weekly" (08/13/2012)  . Grand mal     "maybe once/month" (08/13/2012)  . Arthritis     "hands & arms" (08/13/2012)  . Depression    Past Surgical History  Procedure Laterality Date  . Appendectomy  1970's   Family History  Problem Relation Age of Onset  . Hypertension     History  Substance Use Topics  . Smoking status: Former Smoker -- 0.50 packs/day for 8 years    Types: Cigarettes    Quit date: 12/21/1984  . Smokeless tobacco: Never Used  . Alcohol Use: Yes     Comment:  08/13/2012 "detox program; h/o heavy, binge drinking; last drink was 30 days ago"    Review of Systems  Constitutional: Positive for appetite change.  Negative for fever.  Respiratory: Negative for shortness of breath.   Cardiovascular: Positive for syncope. Negative for chest pain, palpitations and leg swelling.  Endocrine: Positive for polyuria.  Neurological: Positive for dizziness and syncope. Negative for facial asymmetry, weakness, numbness and headaches.  All other systems reviewed and are negative.   Medications  insulin regular (NOVOLIN R,HUMULIN R) 1 Units/mL in sodium chloride 0.9 % 100 mL infusion (3.6 Units/hr Intravenous New Bag/Given 12/04/12 0543)  dextrose 50 % solution 25 mL (not administered)  0.9 %  sodium chloride infusion ( Intravenous New Bag/Given 12/04/12 0504)  DULoxetine (CYMBALTA) DR capsule 30 mg (not administered)  gabapentin (NEURONTIN) capsule 300 mg (not administered)  lipase/protease/amylase (CREON-12/PANCREASE) capsule 2 capsule (not administered)  lisinopril (PRINIVIL,ZESTRIL) tablet 20 mg (not administered)  phenytoin (DILANTIN) ER capsule 200 mg (not administered)  QUEtiapine (SEROQUEL) tablet 100 mg (not administered)  simvastatin (ZOCOR) tablet 10 mg (not administered)  traZODone (DESYREL) tablet 100 mg (not administered)  sodium chloride 0.9 % injection 3 mL (not administered)  acetaminophen (TYLENOL) tablet 650 mg (not administered)    Or  acetaminophen (TYLENOL) suppository 650 mg (not administered)  HYDROcodone-acetaminophen (NORCO/VICODIN) 5-325 MG per tablet 1-2 tablet (1 tablet Oral Given 12/04/12 0530)  ondansetron (ZOFRAN) tablet 4 mg (not administered)    Or  ondansetron (ZOFRAN) injection 4 mg (not administered)  enoxaparin (LOVENOX) injection 40 mg (not administered)  thiamine (B-1) injection 100 mg (not administered)  LORazepam (ATIVAN) tablet 1  mg (not administered)    Or  LORazepam (ATIVAN) injection 1 mg (not administered)  folic acid (FOLVITE) tablet 1 mg (not administered)  multivitamin with minerals tablet 1 tablet (not administered)  LORazepam (ATIVAN) tablet 0-4 mg (not  administered)    Followed by  LORazepam (ATIVAN) tablet 0-4 mg (not administered)  potassium chloride 10 mEq in 100 mL IVPB (not administered)  sodium chloride 0.9 % bolus 2,000 mL (0 mLs Intravenous Stopped 12/04/12 0336)  ketorolac (TORADOL) 30 MG/ML injection 30 mg (30 mg Intravenous Given 12/04/12 0253)  sodium chloride 0.9 % bolus 2,000 mL (0 mLs Intravenous Stopped 12/04/12 0436)   CRITICAL CARE Performed by: Jasmine Awe Total critical care time: 60 minutes Critical care time was exclusive of separately billable procedures and treating other patients. Critical care was necessary to treat or prevent imminent or life-threatening deterioration. Critical care was time spent personally by me on the following activities: development of treatment plan with patient and/or surrogate as well as nursing, discussions with consultants, evaluation of patient's response to treatment, examination of patient, obtaining history from patient or surrogate, ordering and performing treatments and interventions, ordering and review of laboratory studies, ordering and review of radiographic studies, pulse oximetry and re-evaluation of patient's condition.   Allergies  Morphine and related  Home Medications   Current Outpatient Rx  Name  Route  Sig  Dispense  Refill  . DULoxetine (CYMBALTA) 30 MG capsule   Oral   Take 1 capsule (30 mg total) by mouth daily. For depression   30 capsule   0   . gabapentin (NEURONTIN) 300 MG capsule   Oral   Take 1 capsule (300 mg total) by mouth 3 (three) times daily. For anxiety/pain control   90 capsule   0   . insulin aspart protamine- aspart (NOVOLOG MIX 70/30) (70-30) 100 UNIT/ML injection   Subcutaneous   Inject 0.4 mL (40 Units total) into the skin 2 (two) times daily with a meal. For diabetes management   30 mL   0   . lipase/protease/amylase (CREON-12/PANCREASE) 12000 UNITS CPEP capsule   Oral   Take 2 capsules by mouth 3 (three) times daily.  For pancrease problem   270 capsule      . lisinopril (PRINIVIL,ZESTRIL) 20 MG tablet   Oral   Take 1 tablet (20 mg total) by mouth daily. For high blood pressure   30 tablet   0   . metFORMIN (GLUCOPHAGE) 500 MG tablet   Oral   Take 1 tablet (500 mg total) by mouth 2 (two) times daily with a meal. For diabetes management         . Multiple Vitamin (MULTIVITAMIN WITH MINERALS) TABS tablet   Oral   Take 1 tablet by mouth daily. Vitamin supplement         . phenytoin (DILANTIN) 100 MG ER capsule   Oral   Take 2 capsules (200 mg total) by mouth at bedtime. For seizure activities         . QUEtiapine (SEROQUEL) 100 MG tablet   Oral   Take 1 tablet (100 mg total) by mouth at bedtime. For mood control   30 tablet   0   . simvastatin (ZOCOR) 10 MG tablet   Oral   Take 1 tablet (10 mg total) by mouth at bedtime. For high cholesterol management         . traZODone (DESYREL) 100 MG tablet   Oral   Take 1 tablet (100 mg total)  by mouth at bedtime. For sleep   30 tablet   0    BP 116/87  Pulse 104  Temp(Src) 98.1 F (36.7 C) (Oral)  Resp 20  Ht 5\' 11"  (1.803 m)  Wt 143 lb 8 oz (65.091 kg)  BMI 20.02 kg/m2  SpO2 100% Physical Exam  Constitutional: He is oriented to person, place, and time. He appears well-developed and well-nourished.  HENT:  Head: Normocephalic and atraumatic.  Mouth/Throat: No oropharyngeal exudate.  Eyes: Conjunctivae are normal. Pupils are equal, round, and reactive to light.  Neck: Normal range of motion. Neck supple.  Cardiovascular: Regular rhythm and intact distal pulses.   tachy  Pulmonary/Chest: Effort normal and breath sounds normal. He has no wheezes. He has no rales.  Abdominal: Soft. Bowel sounds are normal. There is no tenderness. There is no rebound and no guarding.  Musculoskeletal: Normal range of motion.  Neurological: He is alert and oriented to person, place, and time.  Skin: Skin is warm and dry. He is not diaphoretic.   Psychiatric: He has a normal mood and affect.    ED Course  Procedures (including critical care time) Labs Review Labs Reviewed  CBC WITH DIFFERENTIAL - Abnormal; Notable for the following:    RBC 4.19 (*)    Hemoglobin 12.5 (*)    HCT 34.2 (*)    MCHC 36.5 (*)    All other components within normal limits  COMPREHENSIVE METABOLIC PANEL - Abnormal; Notable for the following:    Sodium 123 (*)    Potassium 3.0 (*)    Chloride 85 (*)    Glucose, Bld 707 (*)    BUN 5 (*)    All other components within normal limits  GLUCOSE, CAPILLARY - Abnormal; Notable for the following:    Glucose-Capillary 523 (*)    All other components within normal limits  URINALYSIS, ROUTINE W REFLEX MICROSCOPIC   Imaging Review No results found.  EKG Interpretation   None       MDM  No diagnosis found. MDM Reviewed: previous chart, nursing note and vitals (positive orthostasis likely from hypovolemia) Interpretation: labs (pseudohyponatremia, hyperglycemia with anion gap) Total time providing critical care: 30-74 minutes. This excludes time spent performing separately reportable procedures and services. Consults: admitting MD    Results for orders placed during the hospital encounter of 12/04/12  CBC WITH DIFFERENTIAL      Result Value Range   WBC 4.8  4.0 - 10.5 K/uL   RBC 4.19 (*) 4.22 - 5.81 MIL/uL   Hemoglobin 12.5 (*) 13.0 - 17.0 g/dL   HCT 91.4 (*) 78.2 - 95.6 %   MCV 81.6  78.0 - 100.0 fL   MCH 29.8  26.0 - 34.0 pg   MCHC 36.5 (*) 30.0 - 36.0 g/dL   RDW 21.3  08.6 - 57.8 %   Platelets 320  150 - 400 K/uL   Neutrophils Relative % 59  43 - 77 %   Neutro Abs 2.8  1.7 - 7.7 K/uL   Lymphocytes Relative 34  12 - 46 %   Lymphs Abs 1.6  0.7 - 4.0 K/uL   Monocytes Relative 7  3 - 12 %   Monocytes Absolute 0.3  0.1 - 1.0 K/uL   Eosinophils Relative 0  0 - 5 %   Eosinophils Absolute 0.0  0.0 - 0.7 K/uL   Basophils Relative 0  0 - 1 %   Basophils Absolute 0.0  0.0 - 0.1 K/uL   COMPREHENSIVE METABOLIC PANEL  Result Value Range   Sodium 123 (*) 135 - 145 mEq/L   Potassium 3.0 (*) 3.5 - 5.1 mEq/L   Chloride 85 (*) 96 - 112 mEq/L   CO2 23  19 - 32 mEq/L   Glucose, Bld 707 (*) 70 - 99 mg/dL   BUN 5 (*) 6 - 23 mg/dL   Creatinine, Ser 4.09  0.50 - 1.35 mg/dL   Calcium 9.1  8.4 - 81.1 mg/dL   Total Protein 7.2  6.0 - 8.3 g/dL   Albumin 3.7  3.5 - 5.2 g/dL   AST 18  0 - 37 U/L   ALT 20  0 - 53 U/L   Alkaline Phosphatase 111  39 - 117 U/L   Total Bilirubin 0.6  0.3 - 1.2 mg/dL   GFR calc non Af Amer >90  >90 mL/min   GFR calc Af Amer >90  >90 mL/min  URINALYSIS, ROUTINE W REFLEX MICROSCOPIC      Result Value Range   Color, Urine STRAW (*) YELLOW   APPearance CLEAR  CLEAR   Specific Gravity, Urine 1.041 (*) 1.005 - 1.030   pH 6.0  5.0 - 8.0   Glucose, UA >1000 (*) NEGATIVE mg/dL   Hgb urine dipstick NEGATIVE  NEGATIVE   Bilirubin Urine NEGATIVE  NEGATIVE   Ketones, ur 15 (*) NEGATIVE mg/dL   Protein, ur NEGATIVE  NEGATIVE mg/dL   Urobilinogen, UA 0.2  0.0 - 1.0 mg/dL   Nitrite NEGATIVE  NEGATIVE   Leukocytes, UA NEGATIVE  NEGATIVE  GLUCOSE, CAPILLARY      Result Value Range   Glucose-Capillary 523 (*) 70 - 99 mg/dL  URINE MICROSCOPIC-ADD ON      Result Value Range   WBC, UA 0-2  <3 WBC/hpf   RBC / HPF 0-2  <3 RBC/hpf  GLUCOSE, CAPILLARY      Result Value Range   Glucose-Capillary 488 (*) 70 - 99 mg/dL  BASIC METABOLIC PANEL      Result Value Range   Sodium 134 (*) 135 - 145 mEq/L   Potassium 3.2 (*) 3.5 - 5.1 mEq/L   Chloride 101  96 - 112 mEq/L   CO2 20  19 - 32 mEq/L   Glucose, Bld 400 (*) 70 - 99 mg/dL   BUN 4 (*) 6 - 23 mg/dL   Creatinine, Ser 9.14  0.50 - 1.35 mg/dL   Calcium 7.8 (*) 8.4 - 10.5 mg/dL   GFR calc non Af Amer >90  >90 mL/min   GFR calc Af Amer >90  >90 mL/min  CBC      Result Value Range   WBC 4.2  4.0 - 10.5 K/uL   RBC 3.53 (*) 4.22 - 5.81 MIL/uL   Hemoglobin 10.6 (*) 13.0 - 17.0 g/dL   HCT 78.2 (*) 95.6 - 21.3 %    MCV 81.3  78.0 - 100.0 fL   MCH 30.0  26.0 - 34.0 pg   MCHC 36.9 (*) 30.0 - 36.0 g/dL   RDW 08.6  57.8 - 46.9 %   Platelets 210  150 - 400 K/uL  GLUCOSE, CAPILLARY      Result Value Range   Glucose-Capillary 406 (*) 70 - 99 mg/dL  GLUCOSE, CAPILLARY      Result Value Range   Glucose-Capillary 420 (*) 70 - 99 mg/dL  GLUCOSE, CAPILLARY      Result Value Range   Glucose-Capillary 289 (*) 70 - 99 mg/dL   No results found.     Mariacristina Aday  Smitty Cords, MD 12/04/12 0710

## 2012-12-04 NOTE — Care Management Note (Addendum)
    Page 1 of 2   12/05/2012     2:09:43 PM   CARE MANAGEMENT NOTE 12/05/2012  Patient:  Gabriel Rush, Gabriel Rush   Account Number:  [id number]  Date Initiated:  12/04/2012  Documentation initiated by:  Ovie Cornelio  Subjective/Objective Assessment:   PT ADM ON 12/04/12 WITH DKA.  PTA, PT INDEPENDENT, LIVES ALONE.  PT IS UNINSURED, HAS MEDICAID PENDING.     Action/Plan:   CONTACTED FINANCIAL COUNSELOR CINDY ROCHELLE TO CHECK STATUS OF MEDICAID APPLICATION.  (APPLIED APPROX 2 MOS AGO, PER PT).   Anticipated DC Date:  12/05/2012   Anticipated DC Plan:  HOME/SELF CARE  In-house referral  Clinical Social Worker  Artist      DC Planning Services  CM consult      Choice offered to / List presented to:             Status of service:  Completed, signed off Medicare Important Message given?   (If response is "NO", the following Medicare IM given date fields will be blank) Date Medicare IM given:   Date Additional Medicare IM given:    Discharge Disposition:  HOME/SELF CARE  Per UR Regulation:  Reviewed for med. necessity/level of care/duration of stay  If discussed at Long Length of Stay Meetings, dates discussed:    Comments:  12/05/12 Madesyn Ast,RN,[name] [phone] REVIEWED MED LIST WITH ATTENDING MD AND WAS ABLE TO PRIORITIZE MEDS FOR PT.  ATTENDING STATED INSULIN, NEURONTIN, AND DILANTIN MOST VITAL FOR PT TO BE COMPLIANT WITH.  PT VERBALIZES UNDERSTANDING OF THIS.  FINANCIAL COUNSELOR HAS CONTACTED PT THIS AM REGARDING MEDICAID APPLICATION.  FOLLOW UP APPT MADE FOR PT AT [facility].  APPT IS NOVEMBER 18 AT 4:00PM. APPT INFO PUT ON AVS.   12/04/12 Milly Goggins,RN,[name] [phone] MET WITH Gabriel Rush TO DISCUSS ISSUES WITH AFFORDING MEDS, FINANCIAL PROBS:  CURRENTLY PT STATES HE DOES A FEW ODD JOBS HERE AND THERE TO HELP SUPPORT HIMSELF; HE DOES GET FOOD STAMPS TO HELP WITH FOOD COSTS.  PT STATES HE IS ON A LOT OF MEDICATIONS, AND CANNOT AFFORD ALL  OF THEM.  HE NEEDS HELP PRIORITIZING WHICH MEDS ARE MOST IMPORTANT FOR HIM TO GET, AS HE HAS SUCH A LIMITED INCOME.  HE STATES HE KNOWS HE HAS USED [facility] FOR THIS YEAR, AND WILL NOT BE ELIGIBLE FOR HELP THIS TIME.  HE STATES THAT HIS MOM DIED RECENTLY, AND SHE WAS THE ONE WHO ALWAYS HELPED HIM OUT, AND KEPT UP WITH THINGS FOR HIM.  HE STATES HE HAS REALLY GONE [place] SINCE THEN.  HE STATES HE HAS NO PCP-WOULD BE A GOOD CANDIDATE FOR [facility], AND IS AGREEABLE TO GO THERE. PT APPLIED FOR MEDICAID APPROX 2 MOS AGO, AND PER FINANCIAL COUNSELOR CINDY ROCHELLE, MAY HAVE BEEN DENIED [facility] DIS[facility] BECAUSE THEY WERE UNABLE TO LOCATE PT, AS HE WAS INCARCERATED.  SHE WILL FOLLOW UP ON THIS AND GET BACK TO ME.  MOST IMPORTANT THING THAT PT NEEDS IS TO KNOW WHICH MEDICATIONS ARE MOST VITAL TO HIS WELL-BEING AT THIS POINT. HE KNOWS THAT ALL MEDS ARE PRESCRIBED FOR HIM, BUT HE NEEDS TO KNOW WHICH ONES WILL KEEP HIM OUT OF THE HOSPITAL.  WILL DISCUSS WITH MD, AS PT IS HAVING TO MAKE VERY DIFFICULT CHOICES REGARDING MEDS AND COMPLIANCE.

## 2012-12-04 NOTE — ED Notes (Signed)
Pt. reports dizziness with nausea and vomitting and bilateral leg " neuropathy" pain , pt. stated he has not taken his diabetic medication for several weeks .

## 2012-12-04 NOTE — Progress Notes (Signed)
Inpatient Diabetes Program Recommendations  AACE/ADA: New Consensus Statement on Inpatient Glycemic Control (2013)  Target Ranges:  Prepandial:   less than 140 mg/dL      Peak postprandial:   less than 180 mg/dL (1-2 hours)      Critically ill patients:  140 - 180 mg/dL  Results for [name], [name] (MRN [id number]) as of 12/04/2012 12:22  Ref. Range 12/04/2012 00:34  Glucose Latest Range: 70-99 mg/dL 086 [facility])   Results for [name], [name] (MRN [id number]) as of 12/04/2012 12:22  Ref. Range 12/04/2012 02:13 12/04/2012 03:35 12/04/2012 04:33 12/04/2012 05:24 12/04/2012 06:41 12/04/2012 07:48 12/04/2012 08:41 12/04/2012 10:01 12/04/2012 10:57 12/04/2012 12:04  Glucose-Capillary Latest Range: 70-99 mg/dL 528 (H) 413 (H) 244 (H) 420 (H) 289 (H) 334 (H) 354 (H) 326 (H) 309 (H) 212 (H)    Inpatient Diabetes Program Recommendations Insulin - Basal: Please order 70/30 40 units BID with meals.  Note: Initial lab glucose noted to be 707 mg/dl on 01/02 at 72:53 and patient was started on an insulin drip.  Blood glucose has improved and patient has been transitioned to SQ insulin.  Patient was given one time Novolog 70/30 40 units at 10:30 and IV insulin was stopped shortly after 12 noon. Please order 70/30 40 units BID with meals (same as outpatient dose of 70/30).  Will continue to follow.  Thanks, Orlando Penner, RN, MSN, CCRN Diabetes Coordinator Inpatient Diabetes Program [phone] (Team Pager) [phone] (AP office) [phone] [facility] office)

## 2012-12-04 NOTE — H&P (Signed)
PCP: No PCP Per Patient    Chief Complaint:  Body aches  HPI: Gabriel Rush is a 52 y.o. male   has a past medical history of Hypertension; Pancreatitis; Alcohol abuse; Chronic abdominal pain; Alcoholism /alcohol abuse; Neuromuscular disorder (07/02/2012); Hypercholesteremia; Type II diabetes mellitus; Headache(784.0); Grand mal; Arthritis; and Depression.   Presented with  Pain and aches all over. Ran out of his meds 1 month ago states his last drink was 1 week ago. Last cocaine use was 2 days ago. Patient has history of noncompliance he here with her blood sugar of 700 states have not use his insulin for the past 1 months. After fluid and insulin administration blood sugar down to 400 she is doing much better. Requesting Percocet for generalized pains  Review of Systems:    Pertinent positives include: body aches, diarrhea, nausea, vomiting, itching  Constitutional:  No weight loss, night sweats, Fevers, chills, fatigue, weight loss  HEENT:  No headaches, Difficulty swallowing,Tooth/dental problems,Sore throat,  No sneezing, itching, ear ache, nasal congestion, post nasal drip,  Cardio-vascular:  No chest pain, Orthopnea, PND, anasarca, dizziness, palpitations.no Bilateral lower extremity swelling  GI:  No heartburn, indigestion, abdominal pain,  change in bowel habits, loss of appetite, melena, blood in stool, hematemesis Resp:  no shortness of breath at rest. No dyspnea on exertion, No excess mucus, no productive cough, No non-productive cough, No coughing up of blood.No change in color of mucus.No wheezing. Skin:  no rash or lesions. No jaundice GU:  no dysuria, change in color of urine, no urgency or frequency. No straining to urinate.  No flank pain.  Musculoskeletal:  No joint pain or no joint swelling. No decreased range of motion. No back pain.  Psych:  No change in mood or affect. No depression or anxiety. No memory loss.  Neuro: no localizing neurological complaints,  no tingling, no weakness, no double vision, no gait abnormality, no slurred speech, no confusion  Otherwise ROS are negative except for above, 10 systems were reviewed  Past Medical History: Past Medical History  Diagnosis Date  . Hypertension   . Pancreatitis   . Alcohol abuse   . Chronic abdominal pain   . Alcoholism /alcohol abuse   . Neuromuscular disorder 07/02/2012    Diabetic neuropathy  . Hypercholesteremia   . Type II diabetes mellitus   . ZOXWRUEA(540.9)     "weekly" (08/13/2012)  . Grand mal     "maybe once/month" (08/13/2012)  . Arthritis     "hands & arms" (08/13/2012)  . Depression    Past Surgical History  Procedure Laterality Date  . Appendectomy  1970's     Medications: Prior to Admission medications   Medication Sig Start Date End Date Taking? Authorizing Provider  DULoxetine (CYMBALTA) 30 MG capsule Take 1 capsule (30 mg total) by mouth daily. For depression 09/21/12  Yes Sanjuana Kava, NP  gabapentin (NEURONTIN) 300 MG capsule Take 1 capsule (300 mg total) by mouth 3 (three) times daily. For anxiety/pain control 09/21/12  Yes Sanjuana Kava, NP  insulin aspart protamine- aspart (NOVOLOG MIX 70/30) (70-30) 100 UNIT/ML injection Inject 0.4 mL (40 Units total) into the skin 2 (two) times daily with a meal. For diabetes management 09/21/12  Yes Sanjuana Kava, NP  lipase/protease/amylase (CREON-12/PANCREASE) 12000 UNITS CPEP capsule Take 2 capsules by mouth 3 (three) times daily. For pancrease problem 09/21/12  Yes Sanjuana Kava, NP  lisinopril (PRINIVIL,ZESTRIL) 20 MG tablet Take 1 tablet (20 mg total) by mouth  daily. For high blood pressure 09/21/12  Yes Sanjuana Kava, NP  metFORMIN (GLUCOPHAGE) 500 MG tablet Take 1 tablet (500 mg total) by mouth 2 (two) times daily with a meal. For diabetes management 09/21/12  Yes Sanjuana Kava, NP  Multiple Vitamin (MULTIVITAMIN WITH MINERALS) TABS tablet Take 1 tablet by mouth daily. Vitamin supplement 09/21/12  Yes Sanjuana Kava, NP   phenytoin (DILANTIN) 100 MG ER capsule Take 2 capsules (200 mg total) by mouth at bedtime. For seizure activities 09/21/12  Yes Sanjuana Kava, NP  QUEtiapine (SEROQUEL) 100 MG tablet Take 1 tablet (100 mg total) by mouth at bedtime. For mood control 09/21/12  Yes Sanjuana Kava, NP  simvastatin (ZOCOR) 10 MG tablet Take 1 tablet (10 mg total) by mouth at bedtime. For high cholesterol management 09/21/12  Yes Sanjuana Kava, NP  traZODone (DESYREL) 100 MG tablet Take 1 tablet (100 mg total) by mouth at bedtime. For sleep 09/21/12  Yes Sanjuana Kava, NP    Allergies:   Allergies  Allergen Reactions  . Morphine And Related Itching    Social History:  Ambulatory   Independently  Lives at home   reports that he quit smoking about 27 years ago. His smoking use included Cigarettes. He has a 4 pack-year smoking history. He has never used smokeless tobacco. He reports that he drinks alcohol. He reports that he uses illicit drugs (Marijuana, "Crack" cocaine, and Cocaine).   Family History: family history includes Hypertension in his mother and another family member.    Physical Exam: Patient Vitals for the past 24 hrs:  BP Temp Temp src Pulse Resp SpO2 Height Weight  12/04/12 0404 130/94 mmHg - - 87 - 100 % - -  12/04/12 0247 94/75 mmHg - - 95 - - - -  12/04/12 0247 111/86 mmHg - - 86 - - - -  12/04/12 0246 121/93 mmHg - - 82 - - - -  12/04/12 0151 116/87 mmHg 98.1 F (36.7 C) Oral 104 - 100 % - -  12/04/12 0034 107/85 mmHg 97.4 F (36.3 C) - 104 20 100 % 5\' 11"  (1.803 m) 65.091 kg (143 lb 8 oz)    1. General:  in No Acute distress 2. Psychological: Alert and Oriented 3. Head/ENT:   Moist  Mucous Membranes                          Head Non traumatic, neck supple                          Poor Dentition 4. SKIN: decreased Skin turgor,  Skin clean Dry and intact no rash patient has extensive excoriations due to pruritus 5. Heart: Regular rate and rhythm no Murmur, Rub or gallop 6. Lungs:  Clear to auscultation bilaterally, no wheezes or crackles   7. Abdomen: Soft, non-tender, Non distended 8. Lower extremities: no clubbing, cyanosis, or edema 9. Neurologically Grossly intact, moving all 4 extremities equally 10. MSK: Normal range of motion  body mass index is 20.02 kg/(m^2).   Labs on Admission:   Recent Labs  12/04/12 0034  NA 123*  K 3.0*  CL 85*  CO2 23  GLUCOSE 707*  BUN 5*  CREATININE 0.88  CALCIUM 9.1    Recent Labs  12/04/12 0034  AST 18  ALT 20  ALKPHOS 111  BILITOT 0.6  PROT 7.2  ALBUMIN 3.7   No  results found for this basename: LIPASE, AMYLASE,  in the last 72 hours  Recent Labs  12/04/12 0034  WBC 4.8  NEUTROABS 2.8  HGB 12.5*  HCT 34.2*  MCV 81.6  PLT 320   No results found for this basename: CKTOTAL, CKMB, CKMBINDEX, TROPONINI,  in the last 72 hours No results found for this basename: TSH, T4TOTAL, FREET3, T3FREE, THYROIDAB,  in the last 72 hours No results found for this basename: VITAMINB12, FOLATE, FERRITIN, TIBC, IRON, RETICCTPCT,  in the last 72 hours Lab Results  Component Value Date   HGBA1C 12.4* 09/06/2012    Estimated Creatinine Clearance: 91.4 ml/min (by C-G formula based on Cr of 0.88). ABG    Component Value Date/Time   PHART 7.421 09/05/2012 2343   HCO3 23.5 09/05/2012 2343   TCO2 20.9 09/05/2012 2343   ACIDBASEDEF 0.2 09/05/2012 2343   O2SAT 96.7 09/05/2012 2343     No results found for this basename: DDIMER      UA no evidence of UTI  Cultures:    Component Value Date/Time   SDES STOOL 07/06/2012 1824   SPECREQUEST Normal 07/06/2012 1824   CULT NO SALMONELLA, SHIGELLA, CAMPYLOBACTER, YERSINIA, OR E.COLI 0157:H7 ISOLATED 07/06/2012 1824   REPTSTATUS 07/10/2012 FINAL 07/06/2012 1824       Radiological Exams on Admission: No results found.  Chart has been reviewed  Assessment/Plan  This is a 51 year old gentleman with uncontrolled diabetes secondary to medical noncompliance presented with  hyperglycemia and electrolyte abnormalities  Present on Admission:  . Polysubstance dependence - no write for social work consult spoke to him about the importance of quitting alcohol just in case patient is actually still continues to drink will put him on CIWA protocol  . Hyponatremia - in the setting of hyperglycemia and possible dehydration,  give IV fluids  . Diabetes mellitus type 2, uncontrolled, with complications - CV hyperglycemia in the setting of not taking his medications for the past one month he states due to financial difficulties have written for social work consult. At this point medical management but once blood sugar improves we'll need to switch to home dose of medications   Prophylaxis:  Lovenox, Protonix  CODE STATUS: Full code  Other plan as per orders.  I have spent a total of 55 min on this admission  Nicholle Falzon 12/04/2012, 4:23 AM

## 2012-12-04 NOTE — Progress Notes (Signed)
Clinical Social Work Department BRIEF PSYCHOSOCIAL ASSESSMENT 12/04/2012  Patient:  Gabriel Rush, Gabriel Rush     Account Number:  [id number]     Admit date:  12/04/2012  Clinical Social Worker:  Carren Rang  Date/Time:  12/04/2012 11:58 AM  Referred by:  Physician  Date Referred:  12/04/2012 Referred for  Substance Abuse   Other Referral:   Interview type:  Patient Other interview type:    PSYCHOSOCIAL DATA Living Status:   Admitted from facility:   Level of care:   Primary support name:  Gabriel Rush Primary support relationship to patient:  PARENT Degree of support available:   Good    CURRENT CONCERNS Current Concerns  Substance Abuse   Other Concerns:    SOCIAL WORK ASSESSMENT / PLAN CSW received referral that patient needs substance use counseling/resources. CSW went into room and introduced self to patient and explained reason for visit. Patient was receptive to CSW visit. Patient stated he wants resources and has been to behavioral health before and states they have really helped him. Patient asked CSW to call finanicial counseling to come talk to him. CSW left message for Arline Asp in financial counseling. CSW provided outpatient resources for patient. Patient states he will follow up and thanked CSW for the visit.   Assessment/plan status:  Psychosocial Support/Ongoing Assessment of Needs Other assessment/ plan:   Information/referral to community resources:   SNF information    PATIENT'S/FAMILY'S RESPONSE TO PLAN OF CARE: Patient was receptive to resources and thanked CSW for the visit.       Maree Krabbe, MSW, Theresia Majors [phone]

## 2012-12-04 NOTE — Progress Notes (Signed)
   Gabriel Rush, is a 52 y.o. male, DOB - 1960-06-25, BJY:[id number] was admitted to the hospital 6 hours ago   1. Nonketotic hyperosmolar state due to noncompliance with insulin. History of type 2 diabetes mellitus - to go stable, no, switch of IV insulin, transition to 7030 along with sliding scale. Check A1c. Counseled on insulin compliance. Gentle IV fluids .   2. Alcohol and polysubstance abuse. Counseled to quit all. C. I WA protocol, folic acid thiamine supplementation.    3. Hypokalemia replaced and recheck    Filed Vitals:   12/04/12 0445 12/04/12 0500 12/04/12 0626 12/04/12 0800  BP: 137/106 138/68 125/93 119/86  Pulse: 85 79 85 86  Temp:   97.6 F (36.4 C) 98 F (36.7 C)  TempSrc:   Oral Oral  Resp:   18 18  Height:   5\' 11"  (1.803 m)   Weight:   70.8 kg (156 lb 1.4 oz)   SpO2: 100% 100% 100% 100%        Data Review   Micro Results No results found for this or any previous visit (from the past 240 hour(s)).  Radiology Reports No results found.  CBC  Recent Labs Lab 12/04/12 0034 12/04/12 0426 12/04/12 0817  WBC 4.8 4.2 3.9*  HGB 12.5* 10.6* 10.5*  HCT 34.2* 28.7* 28.6*  PLT 320 210 209  MCV 81.6 81.3 81.0  MCH 29.8 30.0 29.7  MCHC 36.5* 36.9* 36.7*  RDW 12.8 12.6 12.7  LYMPHSABS 1.6  --   --   MONOABS 0.3  --   --   EOSABS 0.0  --   --   BASOSABS 0.0  --   --     Chemistries   Recent Labs Lab 12/04/12 0034 12/04/12 0426 12/04/12 0645 12/04/12 0817  NA 123* 134* 129* 134*  K 3.0* 3.2* 2.8* 3.1*  CL 85* 101 99 101  CO2 23 20 22 23   GLUCOSE 707* 400* 335* 355*  BUN 5* 4* 4* 3*  CREATININE 0.88 0.71 0.63 0.64  CALCIUM 9.1 7.8* 7.8* 8.0*  AST 18  --   --   --   ALT 20  --   --   --   ALKPHOS 111  --   --   --   BILITOT 0.6  --   --   --    ------------------------------------------------------------------------------------------------------------------ estimated creatinine clearance is 109.4 ml/min (by C-G formula based on Cr  of 0.64). ------------------------------------------------------------------------------------------------------------------ No results found for this basename: HGBA1C,  in the last 72 hours ------------------------------------------------------------------------------------------------------------------ No results found for this basename: CHOL, HDL, LDLCALC, TRIG, CHOLHDL, LDLDIRECT,  in the last 72 hours ------------------------------------------------------------------------------------------------------------------ No results found for this basename: TSH, T4TOTAL, FREET3, T3FREE, THYROIDAB,  in the last 72 hours ------------------------------------------------------------------------------------------------------------------ No results found for this basename: VITAMINB12, FOLATE, FERRITIN, TIBC, IRON, RETICCTPCT,  in the last 72 hours  Coagulation profile No results found for this basename: INR, PROTIME,  in the last 168 hours  No results found for this basename: DDIMER,  in the last 72 hours  Cardiac Enzymes No results found for this basename: CK, CKMB, TROPONINI, MYOGLOBIN,  in the last 168 hours ------------------------------------------------------------------------------------------------------------------ No components found with this basename: POCBNP,

## 2012-12-04 NOTE — ED Notes (Signed)
Lab reports critical value of 707 blood glucose

## 2012-12-05 DIAGNOSIS — D649 Anemia, unspecified: Secondary | ICD-10-CM | POA: Diagnosis present

## 2012-12-05 DIAGNOSIS — E1110 Type 2 diabetes mellitus with ketoacidosis without coma: Secondary | ICD-10-CM

## 2012-12-05 DIAGNOSIS — E876 Hypokalemia: Secondary | ICD-10-CM | POA: Diagnosis present

## 2012-12-05 DIAGNOSIS — Z9119 Patient's noncompliance with other medical treatment and regimen: Secondary | ICD-10-CM

## 2012-12-05 LAB — GLUCOSE, CAPILLARY
Glucose-Capillary: 62 mg/dL — ABNORMAL LOW (ref 70–99)
Glucose-Capillary: 144 mg/dL — ABNORMAL HIGH (ref 70–99)
Glucose-Capillary: 310 mg/dL — ABNORMAL HIGH (ref 70–99)
Glucose-Capillary: 233 mg/dL — ABNORMAL HIGH (ref 70–99)

## 2012-12-05 LAB — CBC
HCT: 26.3 % — ABNORMAL LOW (ref 39.0–52.0)
Hemoglobin: 9.6 g/dL — ABNORMAL LOW (ref 13.0–17.0)
MCHC: 36.5 g/dL — ABNORMAL HIGH (ref 30.0–36.0)
MCV: 82.7 fL (ref 78.0–100.0)
Platelets: 184 10*3/uL (ref 150–400)
RBC: 3.18 MIL/uL — ABNORMAL LOW (ref 4.22–5.81)
RDW: 13.3 % (ref 11.5–15.5)

## 2012-12-05 LAB — LIPASE, BLOOD: Lipase: 9 U/L — ABNORMAL LOW (ref 11–59)

## 2012-12-05 LAB — PHENYTOIN LEVEL, TOTAL: Phenytoin Lvl: <2.5 ug/mL — ABNORMAL LOW (ref 10.0–20.0)

## 2012-12-05 LAB — COMPREHENSIVE METABOLIC PANEL
ALT: 23 U/L (ref 0–53)
Albumin: 2.8 g/dL — ABNORMAL LOW (ref 3.5–5.2)
Alkaline Phosphatase: 78 U/L (ref 39–117)
Calcium: 8.1 mg/dL — ABNORMAL LOW (ref 8.4–10.5)
GFR calc non Af Amer: >90 mL/min (ref >90)
Potassium: 3.1 mEq/L — ABNORMAL LOW (ref 3.5–5.1)
Sodium: 137 mEq/L (ref 135–145)
Total Protein: 5.5 g/dL — ABNORMAL LOW (ref 6.0–8.3)

## 2012-12-05 LAB — PHOSPHORUS: Phosphorus: 3 mg/dL (ref 2.3–4.6)

## 2012-12-05 LAB — MAGNESIUM: Magnesium: 1.7 mg/dL (ref 1.5–2.5)

## 2012-12-05 LAB — TSH: TSH: 0.48 u[IU]/mL (ref 0.350–4.500)

## 2012-12-05 MED ORDER — INFLUENZA VAC SPLIT QUAD 0.5 ML IM SUSP
0.5000 mL | INTRAMUSCULAR | Status: AC
  Administered 2012-12-05: 0.5 mL via INTRAMUSCULAR
  Filled 2012-12-05: qty 0.5

## 2012-12-05 MED ORDER — SODIUM CHLORIDE 0.9 % IV BOLUS (SEPSIS)
500.0000 mL | Freq: Once | INTRAVENOUS | Status: AC
  Administered 2012-12-05: 500 mL via INTRAVENOUS

## 2012-12-05 MED ORDER — POTASSIUM CHLORIDE CRYS ER 20 MEQ PO TBCR
40.0000 meq | EXTENDED_RELEASE_TABLET | Freq: Once | ORAL | Status: AC
  Administered 2012-12-05: 40 meq via ORAL
  Filled 2012-12-05: qty 2

## 2012-12-05 MED ORDER — INSULIN ASPART PROT & ASPART (70-30 MIX) 100 UNIT/ML ~~LOC~~ SUSP: 40.0000 [IU] | Freq: Two times a day (BID) | SUBCUTANEOUS | Status: DC

## 2012-12-05 MED ORDER — PHENYTOIN SODIUM EXTENDED 100 MG PO CAPS: 200.0000 mg | ORAL_CAPSULE | Freq: Every day | ORAL | Status: DC

## 2012-12-05 MED ORDER — VITAMIN B-1 100 MG PO TABS
100.0000 mg | ORAL_TABLET | Freq: Every day | ORAL | Status: DC
  Administered 2012-12-05: 100 mg via ORAL
  Filled 2012-12-05: qty 1

## 2012-12-05 MED ORDER — GLUCOSE 40 % PO GEL
ORAL | Status: AC
  Filled 2012-12-05: qty 1

## 2012-12-05 MED ORDER — GLUCERNA SHAKE PO LIQD
237.0000 mL | Freq: Two times a day (BID) | ORAL | Status: DC
  Filled 2012-12-05: qty 237

## 2012-12-05 MED ORDER — GABAPENTIN 300 MG PO CAPS: 300.0000 mg | ORAL_CAPSULE | Freq: Three times a day (TID) | ORAL | Status: DC

## 2012-12-05 NOTE — Progress Notes (Signed)
Addendum:  Called by Dr. Gonzella Lex, requesting to update DC med reconciliation. As per his advise, DCed Creon on DC meds.  Marcellus Scott, MD, FACP, FHM. [facility] Pager [phone]  If 7PM-7AM, please contact night-coverage [email or web address] Password [email or web address] 12/05/2012, 6:50 PM

## 2012-12-05 NOTE — Progress Notes (Signed)
INITIAL NUTRITION ASSESSMENT  DOCUMENTATION CODES Per approved criteria  -Severe malnutrition in the context of social or environmental circumstances   INTERVENTION:  Glucerna Shake twice daily (220 kcals, 10 gm protein per 8 fl oz bottle) RD to follow for nutrition care plan  NUTRITION DIAGNOSIS: Increased nutrient needs related to malnutrition, repletion as evidenced by estimated nutrition needs  Goal: Pt to meet >/= 90% of their estimated nutrition needs   Monitor:  PO & supplemental intake, weight, labs, I/O's  Reason for Assessment: Malnutrition Screening Tool Report  52 y.o. male  Admitting Dx: body aches  ASSESSMENT: Patient with PMH of HTN, alcohol & cocaine abuse, pancreatitis, abdominal pain, DM and depression presented with pain and aches "all over;" rain out of his medication; in ED patient with critical value of 707 (blood glucose).  Patient reports his appetite is poor; he admits to being homeless but he was staying with a "friend;" patient states there are days where he does not eat because he's "on the run" and does not have access to food; endorses a 30-40 lb weight loss in < 6 months; likes Glucerna Shakes -- RD to order during hospitalization.  Patient meets criteria for severe malnutrition in the context of social/environmental circumstances as evidenced by < 75% intake of estimated energy requirement for > 1 month and 13% weight loss in < 6 months.  Height: Ht Readings from Last 1 Encounters:  12/04/12 5\' 11"  (1.803 m)    Weight: Wt Readings from Last 1 Encounters:  12/04/12 156 lb 1.4 oz (70.8 kg)    Ideal Body Weight: 172 lb  % Ideal Body Weight: 91%  Wt Readings from Last 10 Encounters:  12/04/12 156 lb 1.4 oz (70.8 kg)  09/14/12 155 lb (70.308 kg)  09/08/12 165 lb 5.5 oz (75 kg)  08/14/12 175 lb 14.8 oz (79.8 kg)  07/25/12 180 lb (81.647 kg)  07/20/12 180 lb (81.647 kg)  07/02/12 178 lb (80.74 kg)  07/01/12 158 lb (71.668 kg)  06/11/12  165 lb 12.6 oz (75.2 kg)  05/29/12 158 lb 11.7 oz (72 kg)    Usual Body Weight: 180 lb -- June 2014  % Usual Body Weight: 86%  BMI:  Body mass index is 21.78 kg/(m^2).  Estimated Nutritional Needs: Kcal: 1900-2100 Protein: 95-105 gm Fluid: 1.9-2.1 L  Skin: Intact  Diet Order: Carb Control  EDUCATION NEEDS: -No education needs identified at this time   Intake/Output Summary (Last 24 hours) at 12/05/12 1045 Last data filed at 12/05/12 0834  Gross per 24 hour  Intake    480 ml  Output      0 ml  Net    480 ml    Labs:   Recent Labs Lab 12/04/12 0817 12/04/12 1042 12/05/12 0425  NA 134* 135 137  K 3.1* 3.1* 3.1*  CL 101 102 105  CO2 23 24 22   BUN 3* 3* 6  CREATININE 0.64 0.62 0.58  CALCIUM 8.0* 8.1* 8.1*  MG  --   --  1.7  PHOS  --   --  3.0  GLUCOSE 355* 321* 125*    CBG (last 3)   Recent Labs  12/05/12 0309 12/05/12 0344 12/05/12 0604  GLUCAP 144* 135* 145*    Scheduled Meds: . DULoxetine  30 mg Oral Daily  . enoxaparin (LOVENOX) injection  40 mg Subcutaneous Q24H  . folic acid  1 mg Oral Daily  . gabapentin  300 mg Oral TID  . influenza vac split quadrivalent PF  0.5 mL Intramuscular Tomorrow-1000  . insulin aspart  0-15 Units Subcutaneous TID WC  . insulin aspart protamine- aspart  40 Units Subcutaneous BID WC  . lipase/protease/amylase  2 capsule Oral TID  . LORazepam  0-4 mg Oral Q6H   Followed by  . [START ON 12/06/2012] LORazepam  0-4 mg Oral Q12H  . multivitamin with minerals  1 tablet Oral Daily  . phenytoin  200 mg Oral QHS  . QUEtiapine  100 mg Oral QHS  . simvastatin  10 mg Oral QHS  . sodium chloride  3 mL Intravenous Q12H  . thiamine  100 mg Oral Daily  . traZODone  100 mg Oral QHS    Continuous Infusions: . sodium chloride 100 mL/hr at 12/05/12 0405    Past Medical History  Diagnosis Date  . Hypertension   . Pancreatitis   . Alcohol abuse   . Chronic abdominal pain   . Alcoholism /alcohol abuse   . Neuromuscular  disorder 07/02/2012    Diabetic neuropathy  . Hypercholesteremia   . Type II diabetes mellitus   . Depression   . Diabetic peripheral neuropathy   . Grand mal     "maybe once/month; had one for a whole week just prior to coming here; black out so many times" (12/04/2012)  . Headache(784.0)     "weekly; can get severe; never dx'd w/migraines" (12/04/2012)  . Arthritis     "hands & arms" (12/04/2012)  . Anxiety     Past Surgical History  Procedure Laterality Date  . Appendectomy  1970's    Maureen Chatters, RD, LDN Pager #: [phone] After-Hours Pager #: [phone]

## 2012-12-05 NOTE — Progress Notes (Signed)
Hypoglycemic Event  CBG: 66  Treatment: orange juice, peanut butter, graham crackers  Symptoms: sweating, confused,   Follow-up CBG: Time:2335 CBG Result:73  Possible Reasons for Event: pt previously on insulin drip and Potassium  Comments/MD notified: Per protocol    Theressa Stamps  Remember to initiate Hypoglycemia Order Set & complete

## 2012-12-05 NOTE — Progress Notes (Signed)
Hypoglycemic Event  CBG: 62  Treatment: peanut butter, soda, graham crackers  Symptoms: sweating, confused  Follow-up CBG: Time: 0309 CBG Result: 144  Possible Reasons for Event: pt. Sensitive to previous treatments  Comments/MD notified: per protocol     Theressa Stamps  Remember to initiate Hypoglycemia Order Set & complete

## 2012-12-05 NOTE — Discharge Summary (Addendum)
Physician Discharge Summary  Gabriel Rush ZOX:[id number] DOB: May 30, 1960 DOA: 12/04/2012  PCP: No PCP Per Patient  Admit date: 12/04/2012 Discharge date: 12/05/2012  Time spent: 40 minutes  Recommendations for Outpatient Follow-up:  1. Dishcarge home with out[facility]. 2. Please check BMET and dilantin level during follow up  Discharge Diagnoses:   Principle discharge diagnosis   DKA (diabetic ketoacidoses)  Active Problems:   Chronic pancreatitis   Cocaine abuse   Diabetes mellitus type 2, uncontrolled, with complications   Hyponatremia   Polysubstance dependence   Substance induced mood disorder   Anemia   Hypokalemia Seizure disorder   Discharge Condition: fair  Diet recommendation: diabatic  Filed Weights   12/04/12 0034 12/04/12 0626  Weight: 65.091 kg (143 lb 8 oz) 70.8 kg (156 lb 1.4 oz)    History of present illness:  52 year old male with history of hypertension, chronic alcoholic pancreatitis, uncontrolled type 2 diabetes mellitus, seizures, substance induced mood disorder and depression presented feet and generalized body ache. He reports not taking his medications over one month. His last drink was about a month we will and used crack cocaine 2 days ago. He smokes marijuana on a regular basis. Patient was found to be in DKA with blood sugar of 700. He was started on fluid resuscitation and insulin drip and admitted to telemetry.  Hospital Course:  DKA Now resolved. Off insulin drip and resume home dose of NovoLog 70/30 , 40 units bid.  A1C of 13.3. Patient has not been compliant with his medications because of cost issues and lack of insurance. He is also on multiple different medications which makes him difficult to purchase all the medications. He is waiting for his medicaid. i will simplify his medications and discharge him on 70/30 novolog  40 units bid, neurontin and dilantin only. Patient reports that he would  be able to afford these 3 medications. He was strongly counseled on diet and medication compliance.  Discussed with case manager and have August for outpatient followup [facility] the [facility] in 2 1/2 weeks.  Chronic pancreatitis Patient reports he has quit drinking. Needs to resume pancreatic enzyme once seen in the clinic and able to afford.  Seizure disorder Patient daily Dilantin. Levels are therapeutic. I will resume his home dose of Dilantin. Need to check level during outpatient followup. Counseled on cessation of etoh and cocaine  Polysubstance abuse Counseled on cessation.  Substance-induced mood disorder Patient was admitted to behavioral health 2 months back for detox. Patient on Celexa, trazodone and Cymbalta which I will discontinue upon discharge.  Malnutrition Patient says he is not in a state to buy nutritional supplements  HTN low blood pressure early this morning. Hold BP  medications upon discharge.  Hypokalemia replenished   Patient  stable for discharge home with outpatient followup in the [facility].     Procedures: None Consultations:  None  Discharge Exam: Filed Vitals:   12/05/12 1432  BP: 128/94  Pulse: 83  Temp: 98 F (36.7 C)  Resp: 18    General: Middle aged male in no acute distress HEENT: No pallor, moist oral mucosa Chest: Good to auscultation bilaterally, no added sounds CVS: Normal S1 and S2, no murmurs rub or gallop Abdomen: Soft, nontender, nondistended, bowel sounds present Extremities: Warm, no edema CNS: AAO x3   Discharge Instructions   Future Appointments Provider Department Dept Phone   12/25/2012 4:00 PM [facility] Covering Provider 2 [facility] [phone]       Medication List    STOP taking these medications       DULoxetine 30 MG capsule  Commonly known as:  CYMBALTA     lipase/protease/amylase 09811 UNITS Cpep capsule  Commonly known as:   CREON-12/PANCREASE     lisinopril 20 MG tablet  Commonly known as:  PRINIVIL,ZESTRIL     metFORMIN 500 MG tablet  Commonly known as:  GLUCOPHAGE     multivitamin with minerals Tabs tablet     QUEtiapine 100 MG tablet  Commonly known as:  SEROQUEL     simvastatin 10 MG tablet  Commonly known as:  ZOCOR     traZODone 100 MG tablet  Commonly known as:  DESYREL      TAKE these medications       gabapentin 300 MG capsule  Commonly known as:  NEURONTIN  Take 1 capsule (300 mg total) by mouth 3 (three) times daily. For anxiety/pain control     insulin aspart protamine- aspart (70-30) 100 UNIT/ML injection  Commonly known as:  NOVOLOG MIX 70/30  Inject 0.4 mLs (40 Units total) into the skin 2 (two) times daily with a meal. For diabetes management     phenytoin 100 MG ER capsule  Commonly known as:  DILANTIN  Take 2 capsules (200 mg total) by mouth at bedtime. For seizure activities       Allergies  Allergen Reactions  . Morphine And Related Itching       Follow-up Information   Follow up with [facility]     On 12/25/2012. (4:00PM; Please bring photo ID and all medications you are currently taking to your appointment)    Contact information:   [address] [place] [address] [phone]       The results of significant diagnostics from this hospitalization (including imaging, microbiology, ancillary and laboratory) are listed below for reference.    Significant Diagnostic Studies: No results found.  Microbiology: No results found for this or any previous visit (from the past 240 hour(s)).   Labs: Basic Metabolic Panel:  Recent Labs Lab 12/04/12 0426 12/04/12 0645 12/04/12 0817 12/04/12 1042 12/05/12 0425  NA 134* 129* 134* 135 137  K 3.2* 2.8* 3.1* 3.1* 3.1*  CL 101 99 101 102 105  CO2 20 22 23 24 22   GLUCOSE 400* 335* 355* 321* 125*  BUN 4* 4* 3* 3* 6  CREATININE 0.71 0.63 0.64 0.62 0.58  CALCIUM 7.8*  7.8* 8.0* 8.1* 8.1*  MG  --   --   --   --  1.7  PHOS  --   --   --   --  3.0   Liver Function Tests:  Recent Labs Lab 12/04/12 0034 12/05/12 0425  AST 18 27  ALT 20 23  ALKPHOS 111 78  BILITOT 0.6 0.2*  PROT 7.2 5.5*  ALBUMIN 3.7 2.8*    Recent Labs Lab 12/05/12 1040  LIPASE 9*   No results found for this basename: AMMONIA,  in the last 168 hours CBC:  Recent Labs Lab 12/04/12 0034 12/04/12 0426 12/04/12 0817 12/05/12 0425  WBC 4.8 4.2 3.9* 3.2*  NEUTROABS 2.8  --   --   --   HGB 12.5* 10.6* 10.5* 9.6*  HCT 34.2* 28.7* 28.6* 26.3*  MCV 81.6 81.3 81.0 82.7  PLT 320 210 209 184   Cardiac Enzymes: No results found for this basename: CKTOTAL, CKMB, CKMBINDEX, TROPONINI,  in the last 168 hours BNP: BNP (last 3 results) No results found for this basename: PROBNP,  in the last 8760 hours CBG:  Recent Labs Lab 12/05/12 0309 12/05/12 0344 12/05/12 0604 12/05/12 1104 12/05/12 1600  GLUCAP 144* 135* 145* 310* 233*       Signed:  Izzabelle Bouley  [facility] 12/05/2012, 4:37 PM

## 2012-12-05 NOTE — Progress Notes (Signed)
Pt. Blood pressure 88/68 heart rate 76.  MD paged and made aware, awaiting orders.  Will continue to monitor.   Thane Edu, RN

## 2012-12-25 ENCOUNTER — Inpatient Hospital Stay: Payer: Self-pay

## 2013-03-11 ENCOUNTER — Inpatient Hospital Stay (HOSPITAL_COMMUNITY)
Admission: EM | Admit: 2013-03-11 | Discharge: 2013-03-14 | DRG: 638 | Disposition: A | Discharge status: Home / Self Care | Payer: Self-pay | Attending: Internal Medicine | Admitting: Internal Medicine

## 2013-03-11 ENCOUNTER — Encounter (HOSPITAL_COMMUNITY): Payer: Self-pay | Admitting: Emergency Medicine

## 2013-03-11 DIAGNOSIS — D649 Anemia, unspecified: Secondary | ICD-10-CM

## 2013-03-11 DIAGNOSIS — E1049 Type 1 diabetes mellitus with other diabetic neurological complication: Secondary | ICD-10-CM | POA: Diagnosis present

## 2013-03-11 DIAGNOSIS — F319 Bipolar disorder, unspecified: Secondary | ICD-10-CM | POA: Diagnosis present

## 2013-03-11 DIAGNOSIS — R569 Unspecified convulsions: Secondary | ICD-10-CM | POA: Diagnosis present

## 2013-03-11 DIAGNOSIS — R7309 Other abnormal glucose: Secondary | ICD-10-CM

## 2013-03-11 DIAGNOSIS — E1065 Type 1 diabetes mellitus with hyperglycemia: Secondary | ICD-10-CM | POA: Diagnosis present

## 2013-03-11 DIAGNOSIS — E876 Hypokalemia: Secondary | ICD-10-CM | POA: Diagnosis present

## 2013-03-11 DIAGNOSIS — K861 Other chronic pancreatitis: Secondary | ICD-10-CM | POA: Diagnosis present

## 2013-03-11 DIAGNOSIS — R197 Diarrhea, unspecified: Secondary | ICD-10-CM | POA: Diagnosis present

## 2013-03-11 DIAGNOSIS — G40309 Generalized idiopathic epilepsy and epileptic syndromes, not intractable, without status epilepticus: Secondary | ICD-10-CM | POA: Diagnosis present

## 2013-03-11 DIAGNOSIS — E7800 Pure hypercholesterolemia, unspecified: Secondary | ICD-10-CM | POA: Diagnosis present

## 2013-03-11 DIAGNOSIS — R739 Hyperglycemia, unspecified: Secondary | ICD-10-CM

## 2013-03-11 DIAGNOSIS — F3289 Other specified depressive episodes: Secondary | ICD-10-CM

## 2013-03-11 DIAGNOSIS — Z91199 Patient's noncompliance with other medical treatment and regimen due to unspecified reason: Secondary | ICD-10-CM

## 2013-03-11 DIAGNOSIS — F1210 Cannabis abuse, uncomplicated: Secondary | ICD-10-CM | POA: Diagnosis present

## 2013-03-11 DIAGNOSIS — Z9119 Patient's noncompliance with other medical treatment and regimen: Secondary | ICD-10-CM

## 2013-03-11 DIAGNOSIS — F1010 Alcohol abuse, uncomplicated: Secondary | ICD-10-CM

## 2013-03-11 DIAGNOSIS — Z79899 Other long term (current) drug therapy: Secondary | ICD-10-CM

## 2013-03-11 DIAGNOSIS — Z87891 Personal history of nicotine dependence: Secondary | ICD-10-CM

## 2013-03-11 DIAGNOSIS — F329 Major depressive disorder, single episode, unspecified: Secondary | ICD-10-CM

## 2013-03-11 DIAGNOSIS — I10 Essential (primary) hypertension: Secondary | ICD-10-CM | POA: Diagnosis present

## 2013-03-11 DIAGNOSIS — Z794 Long term (current) use of insulin: Secondary | ICD-10-CM

## 2013-03-11 DIAGNOSIS — E1010 Type 1 diabetes mellitus with ketoacidosis without coma: Principal | ICD-10-CM | POA: Diagnosis present

## 2013-03-11 DIAGNOSIS — E1110 Type 2 diabetes mellitus with ketoacidosis without coma: Secondary | ICD-10-CM

## 2013-03-11 LAB — CBC WITH DIFFERENTIAL/PLATELET
Basophils Absolute: 0 10*3/uL (ref 0.0–0.1)
Basophils Relative: 1 % (ref 0–1)
EOS PCT: 1 % (ref 0–5)
Eosinophils Absolute: 0 10*3/uL (ref 0.0–0.7)
HEMATOCRIT: 32.5 % — AB (ref 39.0–52.0)
Hemoglobin: 11.3 g/dL — ABNORMAL LOW (ref 13.0–17.0)
LYMPHS ABS: 1.5 10*3/uL (ref 0.7–4.0)
LYMPHS PCT: 25 % (ref 12–46)
MCH: 27.9 pg (ref 26.0–34.0)
MCHC: 34.8 g/dL (ref 30.0–36.0)
MCV: 80.2 fL (ref 78.0–100.0)
MONO ABS: 0.3 10*3/uL (ref 0.1–1.0)
Monocytes Relative: 5 % (ref 3–12)
Neutro Abs: 4.2 10*3/uL (ref 1.7–7.7)
Neutrophils Relative %: 69 % (ref 43–77)
Platelets: 215 10*3/uL (ref 150–400)
RBC: 4.05 MIL/uL — AB (ref 4.22–5.81)
RDW: 12 % (ref 11.5–15.5)
WBC: 6.1 10*3/uL (ref 4.0–10.5)

## 2013-03-11 LAB — URINALYSIS, ROUTINE W REFLEX MICROSCOPIC
Bilirubin Urine: NEGATIVE
HGB URINE DIPSTICK: NEGATIVE
Ketones, ur: NEGATIVE mg/dL
Leukocytes, UA: NEGATIVE
Nitrite: NEGATIVE
PH: 5.5 (ref 5.0–8.0)
PROTEIN: NEGATIVE mg/dL
Specific Gravity, Urine: 1.04 — ABNORMAL HIGH (ref 1.005–1.030)
Urobilinogen, UA: 0.2 mg/dL (ref 0.0–1.0)

## 2013-03-11 LAB — BASIC METABOLIC PANEL
BUN: 21 mg/dL (ref 6–23)
CALCIUM: 9 mg/dL (ref 8.4–10.5)
CO2: 23 meq/L (ref 19–32)
CREATININE: 1.1 mg/dL (ref 0.50–1.35)
Chloride: 92 mEq/L — ABNORMAL LOW (ref 96–112)
GFR calc Af Amer: 88 mL/min — ABNORMAL LOW (ref >90)
GFR calc non Af Amer: 76 mL/min — ABNORMAL LOW (ref >90)
Glucose, Bld: 623 mg/dL (ref 70–99)
Potassium: 4.2 mEq/L (ref 3.7–5.3)
SODIUM: 131 meq/L — AB (ref 137–147)

## 2013-03-11 LAB — GLUCOSE, CAPILLARY
GLUCOSE-CAPILLARY: 544 mg/dL — AB (ref 70–99)
Glucose-Capillary: 420 mg/dL — ABNORMAL HIGH (ref 70–99)
Glucose-Capillary: 494 mg/dL — ABNORMAL HIGH (ref 70–99)

## 2013-03-11 LAB — URINE MICROSCOPIC-ADD ON

## 2013-03-11 MED ORDER — INSULIN ASPART 100 UNIT/ML ~~LOC~~ SOLN
10.0000 [IU] | Freq: Once | SUBCUTANEOUS | Status: AC
  Administered 2013-03-11: 10 [IU] via SUBCUTANEOUS
  Filled 2013-03-11: qty 1

## 2013-03-11 MED ORDER — SODIUM CHLORIDE 0.9 % IV BOLUS (SEPSIS)
1000.0000 mL | Freq: Once | INTRAVENOUS | Status: AC
  Administered 2013-03-11: 1000 mL via INTRAVENOUS

## 2013-03-11 MED ORDER — SODIUM CHLORIDE 0.9 % IV SOLN
INTRAVENOUS | Status: DC
  Administered 2013-03-12: 2.4 [IU]/h via INTRAVENOUS
  Filled 2013-03-11: qty 1

## 2013-03-11 NOTE — ED Provider Notes (Signed)
Medical screening examination/treatment/procedure(s) were performed by non-physician practitioner and as supervising physician I was immediately available for consultation/collaboration.  EKG Interpretation   None        Gabriel Ardelean T Fransheska Willingham, MD 03/11/13 2308 

## 2013-03-11 NOTE — ED Notes (Signed)
Pt reports he has been off of Metformin for approximately 1 week - pt was incarcerated and now that he is out he is unable to afford medications and does not have a doctor.  Pt reports increased urination and diarrhea X 3 weeks.  Currently alert and oriented X 4-  Sinus tach on monitor.

## 2013-03-11 NOTE — ED Notes (Signed)
Presents with inability to afford Diabetic medications for one week, c/o increased thirst, urination, generalized body aches from neuropathy and over all fatigue. CBG 544 here. Pt is alert and oriented, answering all questions appropriately.

## 2013-03-11 NOTE — H&P (Signed)
[facility] History and Physical  Gabriel Rush ZOX:[id number] DOB: Jul 01, 1960 DOA: 03/11/2013  Referring physician: ED physician PCP: No PCP Per Patient   Chief Complaint: Generalized weakness  HPI:  53 year old male with history of alcohol abuse, pancreatitis, insulin-dependent diabetes [facility] with main concern of several days duration of progressively worsening generalized weakness, failure to thrive, poor oral intake. Patient explains that he has not been taking his insulin as he was not able to afford it and when he checked his blood sugar levels it was higher than 500. Patient explains that he has not taken any insulin since the last week. He is also reporting polyuria, polydipsia, lower extremity neuropathies. In addition patient reports several episodes of loose stool today, no bloody bowel movements. Patient denies fevers and chills, no specific focal neurological symptoms, no chest pain or shortness of breath. Patient also denies any specific abdominal concerns.  In emergency department, patient noted to be somnolent, easy to arouse, and CBG greater than 600, patient in DKA. [facility] asked to admit to step down unit due to to insulin drip requirements.   Assessment and Plan: Active Problems: DKA - Most likely secondary to medical noncompliance - Will admit to step down unit, placed on glucomannan protocol which includes insulin drip, IV fluids, supplement electrolytes as indicated - Check electrolyte panel every 2 hours until gap closes - Keep n.p.o. for now, advance diet as patient able to tolerate once off insulin drip - Will check alcohol level, urine drug screen - Social work consult to address medication assistance Diabetes mellitus, insulin-dependent  - Manage the case noted above, check A1c  - Idiopathic educator consult placed Polysubstance abuse  - Including alcohol and tobacco, THC  - Will discuss this patient  once patient more clinically stable and able to participate in meaningful discussion   Hyponatremia - Most likely prerenal and secondary to poor oral intake, hyperglycemia - Will place on IV fluids and repeat electrolyte panel in the morning Diarrhea - Unclear etiology, possibly related to chronic pancreatitis, we'll check lipase level  Code Status: Full Family Communication: Pt at bedside Disposition Plan: PT evaluation    Review of Systems:  Please note that patient's poor historian, somnolent and review of systems difficult to obtain    Past Medical History  Diagnosis Date  . Hypertension   . Pancreatitis   . Alcohol abuse   . Chronic abdominal pain   . Alcoholism /alcohol abuse   . Neuromuscular disorder 07/02/2012    Diabetic neuropathy  . Hypercholesteremia   . Type II diabetes mellitus   . Depression   . Diabetic peripheral neuropathy   . Grand mal     "maybe once/month; had one for a whole week just prior to coming here; black out so many times" (12/04/2012)  . Headache(784.0)     "weekly; can get severe; never dx'd w/migraines" (12/04/2012)  . Arthritis     "hands & arms" (12/04/2012)  . Anxiety     Past Surgical History  Procedure Laterality Date  . Appendectomy  1970's    Social History:  reports that he quit smoking about 28 years ago. His smoking use included Cigarettes. He has a 4 pack-year smoking history. He has never used smokeless tobacco. He reports that he drinks about 3.6 ounces of alcohol per week. He reports that he uses illicit drugs (Marijuana, "Crack" cocaine, and Cocaine).  Allergies  Allergen Reactions  . Morphine And Related Itching    Family History  Problem Relation Age of Onset  . Hypertension    . Hypertension Mother     Prior to Admission medications   Medication Sig Start Date End Date Taking? Authorizing Provider  FLUoxetine (PROZAC) 40 MG capsule Take 80 mg by mouth 2 (two) times daily.   Yes Historical Provider, MD   gabapentin (NEURONTIN) 300 MG capsule Take 900 mg by mouth 3 (three) times daily.   Yes Historical Provider, MD  hydrOXYzine (VISTARIL) 100 MG capsule Take 200 mg by mouth 2 (two) times daily.   Yes Historical Provider, MD  ibuprofen (ADVIL,MOTRIN) 200 MG tablet Take 400 mg by mouth daily as needed for mild pain.   Yes Historical Provider, MD  insulin NPH Human (HUMULIN N,NOVOLIN N) 100 UNIT/ML injection Inject 15 Units into the skin 3 (three) times daily.   Yes Historical Provider, MD  loperamide (IMODIUM A-D) 2 MG tablet Take 4 mg by mouth daily as needed for diarrhea or loose stools.   Yes Historical Provider, MD  phenytoin (DILANTIN) 100 MG ER capsule Take 200 mg by mouth at bedtime.   Yes Historical Provider, MD    Physical Exam: Filed Vitals:   03/11/13 2028 03/11/13 2041 03/11/13 2130 03/11/13 2259  BP: 141/104  148/107 145/108  Pulse: 108  104   Temp:  98.2 F (36.8 C)  98 F (36.7 C)  TempSrc:    Oral  Resp: 20  28 22   SpO2: 99%  100% 99%    Physical Exam  Constitutional: Appears somnolent but easy to arouse, not in acute distress  HENT: Normocephalic. External right and left ear normal. dry mucous membranes  Eyes: Conjunctivae and EOM are normal. PERRLA, no scleral icterus.  Neck: Normal ROM. Neck supple. No JVD. No tracheal deviation. No thyromegaly.  CVS: regular rhythm, tachycardic, S1/S2 +, no murmurs, no gallops, no carotid bruit.  Pulmonary: Effort and breath sounds normal, no stridor, rhonchi, wheezes, rales.  Abdominal: Soft. BS +,  no distension, tenderness, rebound or guarding.  Musculoskeletal: Normal range of motion. No edema and no tenderness.  Lymphadenopathy: No lymphadenopathy noted, cervical, inguinal. Neuro: somnolent, easy to arouse, follows some commands appropriately, alert and oriented to name and place, has difficulty with time and date.  Skin: Skin is warm and dry. No rash noted. Not diaphoretic. No erythema. No pallor.  Psychiatric: unable to  assess as patient is rather somnolent   Labs on Admission:  Basic Metabolic Panel:  Recent Labs Lab 03/11/13 1910  NA 131*  K 4.2  CL 92*  CO2 23  GLUCOSE 623*  BUN 21  CREATININE 1.10  CALCIUM 9.0   CBC:  Recent Labs Lab 03/11/13 1910  WBC 6.1  NEUTROABS 4.2  HGB 11.3*  HCT 32.5*  MCV 80.2  PLT 215   CBG:  Recent Labs Lab 03/11/13 1847 03/11/13 2154 03/11/13 2224  GLUCAP 544* 494* 420*    Radiological Exams on Admission: No results found.  EKG: Normal sinus rhythm, no ST/T wave changes  Debbora Presto, MD  [facility] Pager [phone]  If 7PM-7AM, please contact night-coverage [email or web address] Password [email or web address] 03/11/2013, 11:11 PM

## 2013-03-11 NOTE — ED Provider Notes (Signed)
CSN: [id number]     Arrival date & time 03/11/13  1837 History   First MD Initiated Contact with Patient 03/11/13 2010     Chief Complaint  Patient presents with  . Hyperglycemia   (Consider location/radiation/quality/duration/timing/severity/associated sxs/prior Treatment) HPI  53 year old male with history of alcohol abuse, pancreatitis, insulin-dependent diabetes, who presents for evaluation of high blood sugar. Patient states she is not able to afford his diabetic medication and has not taken any medication for the past week. Complaining of polyuria, polydipsia, generalized body aches and neuropathy pain throughout his body. This symptom has been persistent and ongoing for the past one week, and then progressively worse. He also report having an persistent diarrhea ongoing for one month. States having 5-10 bouts of loose stools per day which has been persistent. States he was seen by PCP for this complaint and was given some antibiotic which he took for a week and finish it a week ago but it provides no relief. Denies any prior antibiotic use prior to the persistent diarrhea. Denies any fever, chills, headache, chest pain, shortness of breath, abdominal or back pain aside from the neuropathic pain that he experienced. Denies any dysuria. No rectal pain, no hematochezia or melena.  Past Medical History  Diagnosis Date  . Hypertension   . Pancreatitis   . Alcohol abuse   . Chronic abdominal pain   . Alcoholism /alcohol abuse   . Neuromuscular disorder 07/02/2012    Diabetic neuropathy  . Hypercholesteremia   . Type II diabetes mellitus   . Depression   . Diabetic peripheral neuropathy   . Grand mal     "maybe once/month; had one for a whole week just prior to coming here; black out so many times" (12/04/2012)  . Headache(784.0)     "weekly; can get severe; never dx'd w/migraines" (12/04/2012)  . Arthritis     "hands & arms" (12/04/2012)  . Anxiety    Past Surgical History  Procedure  Laterality Date  . Appendectomy  1970's   Family History  Problem Relation Age of Onset  . Hypertension    . Hypertension Mother    History  Substance Use Topics  . Smoking status: Former Smoker -- 0.50 packs/day for 8 years    Types: Cigarettes    Quit date: 12/21/1984  . Smokeless tobacco: Never Used  . Alcohol Use: 3.6 oz/week    6 Cans of beer per week     Comment: 10/28//2014 "30 day inpatient [facility] 07/2012; h/o heavy, binge drinking; last drink was 1 week ago"    Review of Systems  All other systems reviewed and are negative.    Allergies  Morphine and related  Home Medications   Current Outpatient Rx  Name  Route  Sig  Dispense  Refill  . gabapentin (NEURONTIN) 300 MG capsule   Oral   Take 1 capsule (300 mg total) by mouth 3 (three) times daily. For anxiety/pain control   90 capsule   0   . insulin aspart protamine- aspart (NOVOLOG MIX 70/30) (70-30) 100 UNIT/ML injection   Subcutaneous   Inject 0.4 mLs (40 Units total) into the skin 2 (two) times daily with a meal. For diabetes management   30 mL   3   . phenytoin (DILANTIN) 100 MG ER capsule   Oral   Take 2 capsules (200 mg total) by mouth at bedtime. For seizure activities   60 capsule   3    BP 141/104  Pulse 108  Temp(Src)  98.1 F (36.7 C) (Oral)  Resp 20  SpO2 99% Physical Exam  Nursing note and vitals reviewed. Constitutional: He is oriented to person, place, and time. He appears well-developed and well-nourished. No distress.  Awake, alert, nontoxic appearance  HENT:  Head: Atraumatic.  Eyes: Conjunctivae are normal. Right eye exhibits no discharge. Left eye exhibits no discharge.  Neck: Normal range of motion. Neck supple.  Cardiovascular: Normal rate and regular rhythm.   Tachycardia without murmurs, rubs, or gallops noted   Pulmonary/Chest: Effort normal. No respiratory distress. He exhibits no tenderness.  Abdominal: Soft. There is no tenderness. There is no rebound.   Musculoskeletal: He exhibits no tenderness.  ROM appears intact, no obvious focal weakness  Neurological: He is alert and oriented to person, place, and time. He has normal strength. No cranial nerve deficit or sensory deficit. GCS eye subscore is 4. GCS verbal subscore is 5. GCS motor subscore is 6.  Skin: Skin is warm and dry. No rash noted.  Psychiatric: He has a normal mood and affect.    ED Course  Procedures (including critical care time)  10:48 PM Patient with history of type 2 diabetes his diabetic medication for week. He presents with polyuria, polydipsia, and generalized fatigue.  His initial CBG is in the 600, improves with IV fluid and insulin.  Patient has no anion gap, concerning for DKA. Patient has no altered mental status concerning for HONK. will need admission for further management. Care discussed with Dr. Freida BusmanAllen.  10:58 PM I have consulted with [facility], Dr. Izola PriceMyers who agrees to see and admit pt for obs for hyperglycemia, and further evaluation of chronic diarrhea.  Doubt c.diff, electrolytes are reassuring.    Labs Review Labs Reviewed  CBC WITH DIFFERENTIAL - Abnormal; Notable for the following:    RBC 4.05 (*)    Hemoglobin 11.3 (*)    HCT 32.5 (*)    All other components within normal limits  BASIC METABOLIC PANEL - Abnormal; Notable for the following:    Sodium 131 (*)    Chloride 92 (*)    Glucose, Bld 623 (*)    GFR calc non Af Amer 76 (*)    GFR calc Af Amer 88 (*)    All other components within normal limits  GLUCOSE, CAPILLARY - Abnormal; Notable for the following:    Glucose-Capillary 544 (*)    All other components within normal limits  URINALYSIS, ROUTINE W REFLEX MICROSCOPIC   Imaging Review No results found.  EKG Interpretation   None       MDM   1. Hyperglycemia without ketosis   2. Diarrhea    BP 145/108  Pulse 104  Temp(Src) 98 F (36.7 C) (Oral)  Resp 22  SpO2 99%  I have reviewed nursing notes and vital  signs. I reviewed available ER/hospitalization records thought the EMR     Fayrene HelperBowie Vyron Fronczak, [place] JerseyPA-C 03/11/13 2301

## 2013-03-12 ENCOUNTER — Encounter (HOSPITAL_COMMUNITY): Payer: Self-pay | Admitting: *Deleted

## 2013-03-12 DIAGNOSIS — F319 Bipolar disorder, unspecified: Secondary | ICD-10-CM

## 2013-03-12 DIAGNOSIS — E1110 Type 2 diabetes mellitus with ketoacidosis without coma: Secondary | ICD-10-CM

## 2013-03-12 DIAGNOSIS — R197 Diarrhea, unspecified: Secondary | ICD-10-CM | POA: Diagnosis present

## 2013-03-12 LAB — BASIC METABOLIC PANEL
BUN: 14 mg/dL (ref 6–23)
BUN: 16 mg/dL (ref 6–23)
BUN: 16 mg/dL (ref 6–23)
BUN: 19 mg/dL (ref 6–23)
CALCIUM: 8.2 mg/dL — AB (ref 8.4–10.5)
CALCIUM: 8.1 mg/dL — AB (ref 8.4–10.5)
CALCIUM: 8.1 mg/dL — AB (ref 8.4–10.5)
CO2: 22 mEq/L (ref 19–32)
CO2: 21 mEq/L (ref 19–32)
CO2: 23 mEq/L (ref 19–32)
CO2: 24 mEq/L (ref 19–32)
Calcium: 8.2 mg/dL — ABNORMAL LOW (ref 8.4–10.5)
Chloride: 104 mEq/L (ref 96–112)
Chloride: 106 mEq/L (ref 96–112)
Chloride: 106 mEq/L (ref 96–112)
Chloride: 105 mEq/L (ref 96–112)
Creatinine, Ser: 0.91 mg/dL (ref 0.50–1.35)
Creatinine, Ser: 0.94 mg/dL (ref 0.50–1.35)
Creatinine, Ser: 0.94 mg/dL (ref 0.50–1.35)
Creatinine, Ser: 0.9 mg/dL (ref 0.50–1.35)
GFR calc Af Amer: >90 mL/min (ref >90)
GFR calc Af Amer: >90 mL/min (ref >90)
GFR calc non Af Amer: >90 mL/min (ref >90)
GLUCOSE: 159 mg/dL — AB (ref 70–99)
Glucose, Bld: 257 mg/dL — ABNORMAL HIGH (ref 70–99)
Glucose, Bld: 179 mg/dL — ABNORMAL HIGH (ref 70–99)
Glucose, Bld: 143 mg/dL — ABNORMAL HIGH (ref 70–99)
POTASSIUM: 3.4 meq/L — AB (ref 3.7–5.3)
Potassium: 3.2 mEq/L — ABNORMAL LOW (ref 3.7–5.3)
Potassium: 3.6 mEq/L — ABNORMAL LOW (ref 3.7–5.3)
Potassium: 3.6 mEq/L — ABNORMAL LOW (ref 3.7–5.3)
SODIUM: 141 meq/L (ref 137–147)
Sodium: 140 mEq/L (ref 137–147)
Sodium: 141 mEq/L (ref 137–147)
Sodium: 139 mEq/L (ref 137–147)

## 2013-03-12 LAB — LIPASE, BLOOD: LIPASE: 14 U/L (ref 11–59)

## 2013-03-12 LAB — GLUCOSE, CAPILLARY
GLUCOSE-CAPILLARY: 201 mg/dL — AB (ref 70–99)
GLUCOSE-CAPILLARY: 136 mg/dL — AB (ref 70–99)
GLUCOSE-CAPILLARY: 139 mg/dL — AB (ref 70–99)
GLUCOSE-CAPILLARY: 176 mg/dL — AB (ref 70–99)
GLUCOSE-CAPILLARY: 187 mg/dL — AB (ref 70–99)
Glucose-Capillary: 141 mg/dL — ABNORMAL HIGH (ref 70–99)
Glucose-Capillary: 165 mg/dL — ABNORMAL HIGH (ref 70–99)
Glucose-Capillary: 173 mg/dL — ABNORMAL HIGH (ref 70–99)
Glucose-Capillary: 176 mg/dL — ABNORMAL HIGH (ref 70–99)
Glucose-Capillary: 182 mg/dL — ABNORMAL HIGH (ref 70–99)
Glucose-Capillary: 186 mg/dL — ABNORMAL HIGH (ref 70–99)
Glucose-Capillary: 287 mg/dL — ABNORMAL HIGH (ref 70–99)
Glucose-Capillary: 295 mg/dL — ABNORMAL HIGH (ref 70–99)

## 2013-03-12 LAB — CBC
HCT: 28.5 % — ABNORMAL LOW (ref 39.0–52.0)
Hemoglobin: 10.3 g/dL — ABNORMAL LOW (ref 13.0–17.0)
MCH: 28.6 pg (ref 26.0–34.0)
MCHC: 36.1 g/dL — ABNORMAL HIGH (ref 30.0–36.0)
MCV: 79.2 fL (ref 78.0–100.0)
PLATELETS: 155 10*3/uL (ref 150–400)
RBC: 3.6 MIL/uL — AB (ref 4.22–5.81)
RDW: 11.8 % (ref 11.5–15.5)
WBC: 4.9 10*3/uL (ref 4.0–10.5)

## 2013-03-12 LAB — HEMOGLOBIN A1C
HEMOGLOBIN A1C: 8.8 % — AB (ref <5.7)
MEAN PLASMA GLUCOSE: 206 mg/dL — AB (ref <117)

## 2013-03-12 LAB — RAPID URINE DRUG SCREEN, HOSP PERFORMED
AMPHETAMINES: NOT DETECTED
Barbiturates: NOT DETECTED
Benzodiazepines: NOT DETECTED
Cocaine: NOT DETECTED
OPIATES: NOT DETECTED
Tetrahydrocannabinol: NOT DETECTED

## 2013-03-12 LAB — CLOSTRIDIUM DIFFICILE BY PCR: Toxigenic C. Difficile by PCR: NEGATIVE

## 2013-03-12 LAB — ETHANOL: Alcohol, Ethyl (B): <11 mg/dL (ref 0–11)

## 2013-03-12 LAB — MRSA PCR SCREENING: MRSA BY PCR: NEGATIVE

## 2013-03-12 MED ORDER — DEXTROSE 50 % IV SOLN: 25.0000 mL | INTRAVENOUS | Status: DC | PRN

## 2013-03-12 MED ORDER — HYDROXYZINE HCL 50 MG PO TABS
100.0000 mg | ORAL_TABLET | Freq: Two times a day (BID) | ORAL | Status: DC
  Administered 2013-03-12 – 2013-03-14 (×5): 100 mg via ORAL
  Filled 2013-03-12 (×6): qty 2

## 2013-03-12 MED ORDER — HYDRALAZINE HCL 50 MG PO TABS
50.0000 mg | ORAL_TABLET | Freq: Three times a day (TID) | ORAL | Status: DC
  Administered 2013-03-12 – 2013-03-14 (×6): 50 mg via ORAL
  Filled 2013-03-12 (×8): qty 1

## 2013-03-12 MED ORDER — GABAPENTIN 300 MG PO CAPS
300.0000 mg | ORAL_CAPSULE | Freq: Two times a day (BID) | ORAL | Status: DC
  Administered 2013-03-12 – 2013-03-14 (×5): 300 mg via ORAL
  Filled 2013-03-12 (×7): qty 1

## 2013-03-12 MED ORDER — GABAPENTIN 100 MG PO CAPS
200.0000 mg | ORAL_CAPSULE | Freq: Three times a day (TID) | ORAL | Status: DC
  Administered 2013-03-12: 200 mg via ORAL
  Filled 2013-03-12 (×3): qty 2

## 2013-03-12 MED ORDER — DEXTROSE-NACL 5-0.45 % IV SOLN
INTRAVENOUS | Status: DC
  Administered 2013-03-12: 04:00:00 via INTRAVENOUS

## 2013-03-12 MED ORDER — FLUOXETINE HCL 40 MG PO CAPS
40.0000 mg | ORAL_CAPSULE | Freq: Two times a day (BID) | ORAL | Status: DC
  Administered 2013-03-12 – 2013-03-14 (×5): 40 mg via ORAL
  Filled 2013-03-12 (×7): qty 1

## 2013-03-12 MED ORDER — HYDRALAZINE HCL 20 MG/ML IJ SOLN
10.0000 mg | INTRAMUSCULAR | Status: DC | PRN
  Administered 2013-03-13: 10 mg via INTRAVENOUS
  Filled 2013-03-12: qty 1

## 2013-03-12 MED ORDER — POTASSIUM CHLORIDE 10 MEQ/100ML IV SOLN
10.0000 meq | INTRAVENOUS | Status: AC
  Administered 2013-03-12 (×2): 10 meq via INTRAVENOUS
  Filled 2013-03-12 (×2): qty 100

## 2013-03-12 MED ORDER — INSULIN ASPART PROT & ASPART (70-30 MIX) 100 UNIT/ML ~~LOC~~ SUSP
25.0000 [IU] | Freq: Two times a day (BID) | SUBCUTANEOUS | Status: DC
  Filled 2013-03-12: qty 10

## 2013-03-12 MED ORDER — SODIUM CHLORIDE 0.9 % IV SOLN
INTRAVENOUS | Status: DC
  Administered 2013-03-12 (×2): via INTRAVENOUS

## 2013-03-12 MED ORDER — POTASSIUM CHLORIDE CRYS ER 20 MEQ PO TBCR
40.0000 meq | EXTENDED_RELEASE_TABLET | Freq: Two times a day (BID) | ORAL | Status: DC
  Administered 2013-03-12 – 2013-03-13 (×3): 40 meq via ORAL
  Filled 2013-03-12 (×4): qty 2

## 2013-03-12 MED ORDER — PHENYTOIN SODIUM EXTENDED 100 MG PO CAPS
200.0000 mg | ORAL_CAPSULE | Freq: Every day | ORAL | Status: DC
  Administered 2013-03-12 – 2013-03-13 (×3): 200 mg via ORAL
  Filled 2013-03-12 (×4): qty 2

## 2013-03-12 MED ORDER — OXYCODONE-ACETAMINOPHEN 5-325 MG PO TABS
1.0000 | ORAL_TABLET | ORAL | Status: DC | PRN
  Administered 2013-03-12 – 2013-03-14 (×9): 2 via ORAL
  Filled 2013-03-12 (×9): qty 2

## 2013-03-12 MED ORDER — INSULIN ASPART PROT & ASPART (70-30 MIX) 100 UNIT/ML ~~LOC~~ SUSP
20.0000 [IU] | Freq: Every day | SUBCUTANEOUS | Status: DC
  Administered 2013-03-12: 20 [IU] via SUBCUTANEOUS
  Filled 2013-03-12: qty 10

## 2013-03-12 MED ORDER — INSULIN ASPART 100 UNIT/ML ~~LOC~~ SOLN
0.0000 [IU] | Freq: Every day | SUBCUTANEOUS | Status: DC
  Administered 2013-03-12: 2 [IU] via SUBCUTANEOUS
  Administered 2013-03-14: 3 [IU] via SUBCUTANEOUS

## 2013-03-12 MED ORDER — HYDROXYZINE PAMOATE 50 MG PO CAPS
100.0000 mg | ORAL_CAPSULE | Freq: Two times a day (BID) | ORAL | Status: DC
  Filled 2013-03-12 (×2): qty 2

## 2013-03-12 MED ORDER — INSULIN ASPART PROT & ASPART (70-30 MIX) 100 UNIT/ML ~~LOC~~ SUSP
30.0000 [IU] | Freq: Every day | SUBCUTANEOUS | Status: DC
  Administered 2013-03-13: 30 [IU] via SUBCUTANEOUS
  Filled 2013-03-12: qty 10

## 2013-03-12 MED ORDER — ONDANSETRON HCL 4 MG/2ML IJ SOLN: 4.0000 mg | Freq: Four times a day (QID) | INTRAMUSCULAR | Status: DC | PRN

## 2013-03-12 MED ORDER — SODIUM CHLORIDE 0.9 % IV SOLN
INTRAVENOUS | Status: DC
  Administered 2013-03-12: 125 mL/h via INTRAVENOUS
  Administered 2013-03-13: 14:00:00 via INTRAVENOUS

## 2013-03-12 MED ORDER — SODIUM CHLORIDE 0.9 % IV SOLN
INTRAVENOUS | Status: AC
  Administered 2013-03-12: 999 mL/h via INTRAVENOUS

## 2013-03-12 MED ORDER — HYDRALAZINE HCL 25 MG PO TABS
25.0000 mg | ORAL_TABLET | Freq: Three times a day (TID) | ORAL | Status: DC
  Administered 2013-03-12: 25 mg via ORAL
  Filled 2013-03-12 (×3): qty 1

## 2013-03-12 MED ORDER — HYDROMORPHONE HCL PF 1 MG/ML IJ SOLN
0.5000 mg | INTRAMUSCULAR | Status: DC | PRN
  Administered 2013-03-12 – 2013-03-13 (×7): 0.5 mg via INTRAVENOUS
  Filled 2013-03-12 (×8): qty 1

## 2013-03-12 MED ORDER — ENOXAPARIN SODIUM 40 MG/0.4ML ~~LOC~~ SOLN
40.0000 mg | SUBCUTANEOUS | Status: DC
  Administered 2013-03-12 – 2013-03-14 (×3): 40 mg via SUBCUTANEOUS
  Filled 2013-03-12 (×3): qty 0.4

## 2013-03-12 MED ORDER — INSULIN GLARGINE 100 UNIT/ML ~~LOC~~ SOLN
10.0000 [IU] | Freq: Every day | SUBCUTANEOUS | Status: DC
  Administered 2013-03-12 – 2013-03-13 (×2): 10 [IU] via SUBCUTANEOUS
  Filled 2013-03-12 (×2): qty 0.1

## 2013-03-12 NOTE — Progress Notes (Addendum)
[facility] Progress Note Gabriel Rush TEAM 1 - Stepdown/ICU TEAM   Gabriel Rush ZOX:[id number] DOB: 1961/01/26 DOA: 03/11/2013 PCP: No PCP Per Patient  Brief narrative: 53 year old male with history of alcohol abuse, pancreatitis, insulin-dependent diabetes presenting to [facility] emergency department with main concern of several days duration of progressively worsening generalized weakness, failure to thrive, poor oral intake. Pt was recently incarcerated and was discharged about 1wk ago.  He explains that he has not been taking his insulin (NPH) as he was not able to afford it and when he checked his blood sugar levels it was higher than 500. He has been without insulin for 1 wk and has had symptoms of polyuria, polydipsia and neuropathy in feet and legs.  He was admitted for DKA.   Subjective: Had a loose BM this morning and has had watery diarrhea for 2 days. No localized abdominal pain.   Assessment/Plan: Principal Problem:   DKA (diabetic ketoacidoses) - resolved - given Lantus this AM -  start 70/30 which he confirms he should be able to afford (Rel[employer])   Active Problems:   SEIZURE DISORDER - cont Dilantin- check levels in AM    Diarrhea - check c diff and fecal lactoferrin - noted to be on Imodium as outpt- try lactose free meals.   Hypokalemia -cont to replace- check Mg in AM  HTN - start Hydralazine - can start ACE I when better hydrated  HLP - last lipid profile in July 2013- repeat in AM    Bipolar disorder, unspecified - pt unable to confirm the dose of Prozac and Vistaril that he was started on while in jail - will place him on 40 BID of Prozac (tells me he took 2 tabs twice a day) and 100 mg of Vistaril BID and follow.     Code Status: Full code Family Communication: none Disposition Plan: transfer to med/surg  Consultants: none  Procedures: none  Antibiotics: none  DVT prophylaxis: Lovenox  Objective: Blood pressure 150/112, pulse  100, temperature 98.2 F (36.8 C), temperature source Oral, resp. rate 19, height 5\' 11"  (1.803 m), weight 74.6 kg (164 lb 7.4 oz), SpO2 100.00%.  Intake/Output Summary (Last 24 hours) at 03/12/13 1453 Last data filed at 03/12/13 1051  Gross per 24 hour  Intake    515 ml  Output    700 ml  Net   -185 ml     Exam: General: No acute respiratory distress Lungs: Clear to auscultation bilaterally without wheezes or crackles Cardiovascular: Regular rate and rhythm without murmur gallop or rub normal S1 and S2 Abdomen: Nontender, nondistended, soft, bowel sounds positive, no rebound, no ascites, no appreciable mass Extremities: No significant cyanosis, clubbing, or edema bilateral lower extremities  Data Reviewed: Basic Metabolic Panel:  Recent Labs Lab 03/11/13 1910 03/12/13 0012 03/12/13 0218 03/12/13 0430 03/12/13 0740  NA 131* 140 141 141 139  K 4.2 3.2* 3.6* 3.6* 3.4*  CL 92* 104 106 106 105  CO2 23 22 21 23 24   GLUCOSE 623* 257* 179* 159* 143*  BUN 21 19 16 16 14   CREATININE 1.10 0.91 0.94 0.94 0.90  CALCIUM 9.0 8.2* 8.2* 8.1* 8.1*   Liver Function Tests: No results found for this basename: AST, ALT, ALKPHOS, BILITOT, PROT, ALBUMIN,  in the last 168 hours  Recent Labs Lab 03/12/13 0137  LIPASE 14   No results found for this basename: AMMONIA,  in the last 168 hours CBC:  Recent Labs Lab 03/11/13 1910 03/12/13 0137  WBC 6.1 4.9  NEUTROABS 4.2  --   HGB 11.3* 10.3*  HCT 32.5* 28.5*  MCV 80.2 79.2  PLT 215 155   Cardiac Enzymes: No results found for this basename: CKTOTAL, CKMB, CKMBINDEX, TROPONINI,  in the last 168 hours BNP (last 3 results) No results found for this basename: PROBNP,  in the last 8760 hours CBG:  Recent Labs Lab 03/12/13 0649 03/12/13 0809 03/12/13 0911 03/12/13 1023 03/12/13 1134  GLUCAP 136* 139* 176* 176* 186*    Recent Results (from the past 240 hour(s))  MRSA PCR SCREENING     Status: None   Collection Time     03/12/13  6:03 AM      Result Value Range Status   MRSA by PCR NEGATIVE  NEGATIVE Final   Comment:            The GeneXpert MRSA Assay (FDA     approved for NASAL specimens     only), is one component of a     comprehensive MRSA colonization     surveillance program. It is not     intended to diagnose MRSA     infection nor to guide or     monitor treatment for     MRSA infections.     Studies:  Recent x-ray studies have been reviewed in detail by the Attending Physician  Scheduled Meds:  Scheduled Meds: . enoxaparin (LOVENOX) injection  40 mg Subcutaneous Q24H  . FLUoxetine  40 mg Oral BID  . gabapentin  300 mg Oral BID  . hydrALAZINE  25 mg Oral Q8H  . hydrOXYzine  100 mg Oral BID  . insulin aspart  0-9 Units Subcutaneous Q lunch  . insulin aspart protamine- aspart  20 Units Subcutaneous Q supper  . [START ON 03/13/2013] insulin aspart protamine- aspart  30 Units Subcutaneous Q breakfast  . insulin glargine  10 Units Subcutaneous Daily  . phenytoin  200 mg Oral QHS  . potassium chloride  40 mEq Oral BID   Continuous Infusions: . sodium chloride Stopped (03/12/13 0337)  . sodium chloride 125 mL/hr at 03/12/13 1051    Time spent on care of this patient: >35 min   Gabriel Rush,Larry Knipp, MD  [facility] Office  [phone] Pager - Text Page per Loretha StaplerAmion as per below:  On-Call/Text Page:      [email or web address]      password [email or web address]  If 7PM-7AM, please contact night-coverage [email or web address] Password [email or web address] 03/12/2013, 2:53 PM   LOS: 1 day

## 2013-03-12 NOTE — Plan of Care (Signed)
Problem: Phase I Progression Outcomes Goal: Pain controlled with appropriate interventions Outcome: Progressing Pt denies pain after 0.5 of dilaudid administration Goal: Voiding-avoid urinary catheter unless indicated Outcome: Progressing Pt voiding will continue to monitor Goal: Diabetes Coordinator Consult Outcome: Progressing MD placed order Goal: Pt. states reason for hospitalization Outcome: Progressing Pt states that him not being able to take medicines is the reason for his dka

## 2013-03-12 NOTE — Progress Notes (Signed)
Gabriel LoryRodney Console [id number]  Transfer Data: 03/12/2013 4:42 PM  Attending Provider: Calvert CantorSaima Rizwan, MD  PCP:No PCP Per Patient  Code Status: Full  Gabriel Rush is a 53 y.o. male patient [facility]  -No acute distress noted.  -No complaints of shortness of breath.  -No complaints of chest pain.   Blood pressure 154/106, pulse 89, temperature 98.3 F (36.8 C), temperature source Oral, resp. rate 18, height 5\' 11"  (1.803 m), weight 74.1 kg (163 lb 5.8 oz), SpO2 100.00%.  ?  IV Fluids: IV in place, occlusive dsg intact without redness, IV cath forearm right, condition patent and no redness  normal saline.  Allergies: Morphine and related  Past Medical History:  has a past medical history of Hypertension; Pancreatitis; Alcohol abuse; Chronic abdominal pain; Alcoholism /alcohol abuse; Neuromuscular disorder (07/02/2012); Hypercholesteremia; Type II diabetes mellitus; Depression; Diabetic peripheral neuropathy; Grand mal; Headache(784.0); Arthritis; and Anxiety.  Past Surgical History:  has past surgical history that includes Appendectomy (1970's).  Social History:  reports that he quit smoking about 28 years ago. His smoking use included Cigarettes. He has a 4 pack-year smoking history. He has never used smokeless tobacco. He reports that he drinks about 3.6 ounces of alcohol per week. He reports that he uses illicit drugs (Marijuana, "Crack" cocaine, and Cocaine).  Skin: flaky  Patient/Family orientated to room. Information packet given to patient/family. Admission inpatient armband information verified with patient/family to include name and date of birth and placed on patient arm. Side rails up x 2, fall assessment and education completed with patient/family. Patient/family able to verbalize understanding of risk associated with falls and verbalized understanding to call for assistance before getting out of bed. Call light within reach. Patient/family able to voice and demonstrate  understanding of unit orientation instructions.  Will continue to evaluate and treat per MD orders.

## 2013-03-12 NOTE — Progress Notes (Addendum)
Inpatient Diabetes Program Recommendations  AACE/ADA: New Consensus Statement on Inpatient Glycemic Control (2013)  Target Ranges:  Prepandial:   less than 140 mg/dL      Peak postprandial:   less than 180 mg/dL (1-2 hours)      Critically ill patients:  140 - 180 mg/dL   Hyperglycemia: HHNK  Diabetes history: 70/30- 25 units bid Got lantus 10 units around 0700 this am at the same time the IV insulin drip was discontinued. Pt will need correction insulin ordered asap. If eating, he will also need meal coverage. 70/30 is not ordered to start until this evening. Recommend the moderate correction tidwc or q4hrs if not eating  If eating today before supper, recommend starting 4 units meal coverage as well. Noted strong history of depression. Psych consult may be needed as well. Will follow and talk with patient. Care Management consult to assist with getting his insulin and meter strips at [facility] ([employer]) Thank you, Lenor CoffinAnn Adylene Dlugosz, RN, CNS, Diabetes Coordinator [phone])

## 2013-03-12 NOTE — Plan of Care (Signed)
Problem: Food- and Nutrition-Related Knowledge Deficit (NB-1.1) Goal: Nutrition education Formal process to instruct or train a patient/client in a skill or to impart knowledge to help patients/clients voluntarily manage or modify food choices and eating behavior to maintain or improve health. Outcome: Completed/Met Date Met:  03/12/13  RD consulted for nutrition education regarding diabetes.     Lab Results  Component Value Date    HGBA1C 13.3* 12/04/2012    RD provided "Carbohydrate Counting for People with Diabetes" handout from the Academy of Nutrition and Dietetics. Discussed different food groups and their effects on blood sugar, emphasizing carbohydrate-containing foods. Provided list of carbohydrates and recommended serving sizes of common foods.  Discussed importance of controlled and consistent carbohydrate intake throughout the day. Provided examples of ways to balance meals/snacks and encouraged intake of high-fiber, whole grain complex carbohydrates. Teach back method used.  Expect poor compliance.  Body mass index is 22.95 kg/(m^2). Pt meets criteria for normal weight based on current BMI.  Current diet order is Carbohydrate Modified Medium - pt has yet to receive meal. Labs and medications reviewed. No further nutrition interventions warranted at this time. RD contact information provided. If additional nutrition issues arise, please re-consult RD.  Inda Coke MS, RD, LDN Pager: [phone] After-hours pager: [phone]

## 2013-03-12 NOTE — Care Management Note (Signed)
    Page 1 of 1   03/14/2013     5:00:28 PM   CARE MANAGEMENT NOTE 03/14/2013  Patient:  Gabriel Rush,Gabriel Rush   Account Number:  [id number]  Date Initiated:  03/12/2013  Documentation initiated by:  [name],[name]  Subjective/Objective Assessment:   Pt admitted with DKA     Action/Plan:   PTA pt lived at home-   Anticipated DC Date:  03/14/2013   Anticipated DC Plan:  HOME/SELF CARE      DC Planning Services  CM consult  [facility]      Choice offered to / List presented to:             Status of service:  Completed, signed off Medicare Important Message given?   (If response is "NO", the following Medicare IM given date fields will be blank) Date Medicare IM given:   Date Additional Medicare IM given:    Discharge Disposition:  HOME/SELF CARE  Per UR Regulation:  Reviewed for med. necessity/level of care/duration of stay  If discussed at Long Length of Stay Meetings, dates discussed:    Comments:  03/14/13 16:58 [name], [name] [phone] patient is not eligible for Match Program, [facility], NCM informed him to go to their pharmacy to get medications, his insulin would be $10 and the other would be $4,  NCM called [facility] to confirm.  03/12/13- 1030- [name], [name] [phone] Referral received for f/u needs for DM- spoke with pt at bedside- per conversation pt states that [facility], he reports that he has glucose meter/strips (not sure it works right) may need further education on how to use meter- pt is agreeable to be set up again at the clinic- call made and f/u appointment made for Feb. 25 at 12:00 with MD for hospital f/u. Also made eligibility appointment for same day at 12:30 for orange card- information given to pt about eligibility and orange card and paperwork needed for appointment. Info given to pt on [facility] [employer] brand  for insulin and diabetic supplies. - NCM to continue to follow for further d/c needs.

## 2013-03-12 NOTE — Progress Notes (Signed)
Utilization review completed.  

## 2013-03-12 NOTE — Plan of Care (Signed)
Chart reviewed. Note pt reported unable to afford pre admit insulin which was NPH 15 u TID. Was given Lantus low dose x 1 earlier this am. Will start 70/30 25 u BID and plan dc on Relion brand since cost is $25/vial.  Junious SilkAllison Ellis, ANP

## 2013-03-13 LAB — BASIC METABOLIC PANEL
BUN: 13 mg/dL (ref 6–23)
CALCIUM: 8.4 mg/dL (ref 8.4–10.5)
CO2: 23 mEq/L (ref 19–32)
Chloride: 106 mEq/L (ref 96–112)
Creatinine, Ser: 0.9 mg/dL (ref 0.50–1.35)
GFR calc Af Amer: >90 mL/min (ref >90)
Glucose, Bld: 105 mg/dL — ABNORMAL HIGH (ref 70–99)
Potassium: 3.9 mEq/L (ref 3.7–5.3)
SODIUM: 140 meq/L (ref 137–147)

## 2013-03-13 LAB — GLUCOSE, CAPILLARY
GLUCOSE-CAPILLARY: 86 mg/dL (ref 70–99)
GLUCOSE-CAPILLARY: 27 mg/dL — AB (ref 70–99)
GLUCOSE-CAPILLARY: 36 mg/dL — AB (ref 70–99)
GLUCOSE-CAPILLARY: 78 mg/dL (ref 70–99)
Glucose-Capillary: 186 mg/dL — ABNORMAL HIGH (ref 70–99)
Glucose-Capillary: 223 mg/dL — ABNORMAL HIGH (ref 70–99)

## 2013-03-13 LAB — CBC
HCT: 30.8 % — ABNORMAL LOW (ref 39.0–52.0)
Hemoglobin: 10.9 g/dL — ABNORMAL LOW (ref 13.0–17.0)
MCH: 28.3 pg (ref 26.0–34.0)
MCHC: 35.4 g/dL (ref 30.0–36.0)
MCV: 80 fL (ref 78.0–100.0)
PLATELETS: 171 10*3/uL (ref 150–400)
RBC: 3.85 MIL/uL — AB (ref 4.22–5.81)
RDW: 12.1 % (ref 11.5–15.5)
WBC: 5.2 10*3/uL (ref 4.0–10.5)

## 2013-03-13 LAB — LIPID PANEL
Cholesterol: 170 mg/dL (ref 0–200)
HDL: 50 mg/dL (ref >39)
LDL Cholesterol: 96 mg/dL (ref 0–99)
Total CHOL/HDL Ratio: 3.4 RATIO
Triglycerides: 121 mg/dL (ref <150)
VLDL: 24 mg/dL (ref 0–40)

## 2013-03-13 NOTE — Progress Notes (Signed)
[facility] Progress Note   Gabriel Rush WUJ:[id number] DOB: 01/12/1961 DOA: 03/11/2013 PCP: No PCP Per Patient  Brief narrative: 53 year old male with history of alcohol abuse, pancreatitis, insulin-dependent diabetes presenting to [facility] emergency department with main concern of several days duration of progressively worsening generalized weakness, failure to thrive, poor oral intake. Pt was recently incarcerated and was discharged about 1wk ago.  He explains that he has not been taking his insulin (NPH) as he was not able to afford it and when he checked his blood sugar levels it was higher than 500. He has been without insulin for 1 wk and has had symptoms of polyuria, polydipsia and neuropathy in feet and legs.  He was admitted for DKA.   Subjective: About to be discharged today, had low blood sugar of 27. Patient did receive 70/30 insulin mix and the Lantus and same time. Discontinue both.  Assessment/Plan: Principal Problem: Hyperglycemia -Secondary to nonadherence with insulin, patient was unable to afford his insulin. -Started on 70/30 insulin with 30 units in a.m. and 20 units at p.m. -Received extra dose of Lantus insulin today, patient had hypoglycemia. -Will discontinue all insulin except the sliding scale. -On discharge patient will be restarting his 70/30  Active Problems:   SEIZURE DISORDER - cont Dilantin- check levels in AM    Diarrhea -C. difficile is negative by PCR. -Patient was on Imodium as outpatient, he is on lactose free diet  Hypokalemia -cont to replace- check Mg in AM  HTN - start Hydralazine - can start ACE I when better hydrated  HLP - last lipid profile in July 2013- repeat in AM    Bipolar disorder, unspecified - pt unable to confirm the dose of Prozac and Vistaril that he was started on while in jail - will place him on 40 BID of Prozac (tells me he took 2 tabs twice a day) and 100 mg of Vistaril BID and follow.     Code  Status: Full code Family Communication: none Disposition Plan: transfer to med/surg  Consultants: none  Procedures: none  Antibiotics: none  DVT prophylaxis: Lovenox  Objective: Blood pressure 109/81, pulse 102, temperature 98 F (36.7 C), temperature source Oral, resp. rate 20, height 5\' 11"  (1.803 m), weight 74.1 kg (163 lb 5.8 oz), SpO2 100.00%.  Intake/Output Summary (Last 24 hours) at 03/13/13 1540 Last data filed at 03/13/13 1400  Gross per 24 hour  Intake   1710 ml  Output      0 ml  Net   1710 ml     Exam: General: No acute respiratory distress Lungs: Clear to auscultation bilaterally without wheezes or crackles Cardiovascular: Regular rate and rhythm without murmur gallop or rub normal S1 and S2 Abdomen: Nontender, nondistended, soft, bowel sounds positive, no rebound, no ascites, no appreciable mass Extremities: No significant cyanosis, clubbing, or edema bilateral lower extremities  Data Reviewed: Basic Metabolic Panel:  Recent Labs Lab 03/12/13 0012 03/12/13 0218 03/12/13 0430 03/12/13 0740 03/13/13 0455  NA 140 141 141 139 140  K 3.2* 3.6* 3.6* 3.4* 3.9  CL 104 106 106 105 106  CO2 22 21 23 24 23   GLUCOSE 257* 179* 159* 143* 105*  BUN 19 16 16 14 13   CREATININE 0.91 0.94 0.94 0.90 0.90  CALCIUM 8.2* 8.2* 8.1* 8.1* 8.4   Liver Function Tests: No results found for this basename: AST, ALT, ALKPHOS, BILITOT, PROT, ALBUMIN,  in the last 168 hours  Recent Labs Lab 03/12/13 0137  LIPASE 14  No results found for this basename: AMMONIA,  in the last 168 hours CBC:  Recent Labs Lab 03/11/13 1910 03/12/13 0137 03/13/13 0455  WBC 6.1 4.9 5.2  NEUTROABS 4.2  --   --   HGB 11.3* 10.3* 10.9*  HCT 32.5* 28.5* 30.8*  MCV 80.2 79.2 80.0  PLT 215 155 171   Cardiac Enzymes: No results found for this basename: CKTOTAL, CKMB, CKMBINDEX, TROPONINI,  in the last 168 hours BNP (last 3 results) No results found for this basename: PROBNP,  in the  last 8760 hours CBG:  Recent Labs Lab 03/12/13 2140 03/13/13 0802 03/13/13 1203 03/13/13 1210 03/13/13 1231  GLUCAP 182* 86 36* 27* 78    Recent Results (from the past 240 hour(s))  MRSA PCR SCREENING     Status: None   Collection Time    03/12/13  6:03 AM      Result Value Range Status   MRSA by PCR NEGATIVE  NEGATIVE Final   Comment:            The GeneXpert MRSA Assay (FDA     approved for NASAL specimens     only), is one component of a     comprehensive MRSA colonization     surveillance program. It is not     intended to diagnose MRSA     infection nor to guide or     monitor treatment for     MRSA infections.  CLOSTRIDIUM DIFFICILE BY PCR     Status: None   Collection Time    03/12/13 12:07 PM      Result Value Range Status   C difficile by pcr NEGATIVE  NEGATIVE Final     Studies:  Recent x-ray studies have been reviewed in detail by the Attending Physician  Scheduled Meds:  Scheduled Meds: . enoxaparin (LOVENOX) injection  40 mg Subcutaneous Q24H  . FLUoxetine  40 mg Oral BID  . gabapentin  300 mg Oral BID  . hydrALAZINE  50 mg Oral Q8H  . hydrOXYzine  100 mg Oral BID  . insulin aspart  0-9 Units Subcutaneous Q lunch  . insulin aspart protamine- aspart  20 Units Subcutaneous Q supper  . insulin aspart protamine- aspart  30 Units Subcutaneous Q breakfast  . insulin glargine  10 Units Subcutaneous Daily  . phenytoin  200 mg Oral QHS  . potassium chloride  40 mEq Oral BID   Continuous Infusions: . sodium chloride 75 mL/hr at 03/13/13 1349  . sodium chloride 125 mL/hr at 03/12/13 2018    Time spent on care of this patient: >35 min   Clint LippsELMAHI,Rudie Rikard A, MD  [facility] Office  [phone] Pager - Text Page per Loretha StaplerAmion as per below:  On-Call/Text Page:      [email or web address]      password [email or web address]  If 7PM-7AM, please contact night-coverage [email or web address] Password [email or web address] 03/13/2013, 3:40 PM   LOS: 2 days

## 2013-03-13 NOTE — Progress Notes (Signed)
Hypoglycemic Event  CBG: 37  Treatment: 15 GM carbohydrate snack  Symptoms: Sweaty, Shaky   Follow-up CBG: Time:1212 CBG Result: 27  Treaatment: 15 GM carb snack    CBG Result: 78  Possible Reasons for Event: Medications?   Comments/MD notified:Dr. Rose PhiElmahi     LEsperance, Thornell MuleRachel C  Remember to initiate Hypoglycemia Order Set & complete

## 2013-03-13 NOTE — Clinical Social Work Note (Signed)
Two bus passes given to patient. CSW signing off.  Roddie McBryant Riku Buttery, [place], [place], [phone]

## 2013-03-14 DIAGNOSIS — F329 Major depressive disorder, single episode, unspecified: Secondary | ICD-10-CM

## 2013-03-14 DIAGNOSIS — R197 Diarrhea, unspecified: Secondary | ICD-10-CM

## 2013-03-14 DIAGNOSIS — F3289 Other specified depressive episodes: Secondary | ICD-10-CM

## 2013-03-14 LAB — GLUCOSE, CAPILLARY
GLUCOSE-CAPILLARY: 131 mg/dL — AB (ref 70–99)
GLUCOSE-CAPILLARY: 226 mg/dL — AB (ref 70–99)

## 2013-03-14 LAB — BASIC METABOLIC PANEL
BUN: 9 mg/dL (ref 6–23)
CHLORIDE: 103 meq/L (ref 96–112)
CO2: 26 mEq/L (ref 19–32)
CREATININE: 0.93 mg/dL (ref 0.50–1.35)
Calcium: 8.8 mg/dL (ref 8.4–10.5)
GFR calc non Af Amer: >90 mL/min (ref >90)
GLUCOSE: 129 mg/dL — AB (ref 70–99)
Potassium: 4 mEq/L (ref 3.7–5.3)
Sodium: 140 mEq/L (ref 137–147)

## 2013-03-14 MED ORDER — INSULIN NPH ISOPHANE & REGULAR (70-30) 100 UNIT/ML ~~LOC~~ SUSP: SUBCUTANEOUS | Status: DC

## 2013-03-14 MED ORDER — INSULIN ASPART PROT & ASPART (70-30 MIX) 100 UNIT/ML ~~LOC~~ SUSP
30.0000 [IU] | Freq: Every day | SUBCUTANEOUS | Status: DC
  Administered 2013-03-14: 30 [IU] via SUBCUTANEOUS
  Filled 2013-03-14: qty 10

## 2013-03-14 MED ORDER — PHENYTOIN SODIUM EXTENDED 200 MG PO CAPS: 200.0000 mg | ORAL_CAPSULE | Freq: Every day | ORAL | Status: DC

## 2013-03-14 MED ORDER — INSULIN ASPART PROT & ASPART (70-30 MIX) 100 UNIT/ML ~~LOC~~ SUSP: 20.0000 [IU] | Freq: Every day | SUBCUTANEOUS | Status: DC

## 2013-03-14 NOTE — Progress Notes (Signed)
Gabriel Rush to be D/C'd Home per MD order.  Discussed with the patient and all questions fully answered.    Medication List    STOP taking these medications       ibuprofen 200 MG tablet  Commonly known as:  ADVIL,MOTRIN     insulin NPH Human 100 UNIT/ML injection  Commonly known as:  HUMULIN N,NOVOLIN N      TAKE these medications       FLUoxetine 40 MG capsule  Commonly known as:  PROZAC  Take 80 mg by mouth 2 (two) times daily.     gabapentin 300 MG capsule  Commonly known as:  NEURONTIN  Take 900 mg by mouth 3 (three) times daily.     hydrOXYzine 100 MG capsule  Commonly known as:  VISTARIL  Take 200 mg by mouth 2 (two) times daily.     insulin NPH-regular Human (70-30) 100 UNIT/ML injection  Commonly known as:  RELION 70/30  30 units with breakfast and 20 units with dinner.     loperamide 2 MG tablet  Commonly known as:  IMODIUM A-D  Take 4 mg by mouth daily as needed for diarrhea or loose stools.     phenytoin 200 MG ER capsule  Commonly known as:  DILANTIN  Take 1 capsule (200 mg total) by mouth at bedtime.        VVS, Skin clean, dry and intact without evidence of skin break down, no evidence of skin tears noted. IV catheter discontinued intact. Site without signs and symptoms of complications. Dressing and pressure applied.  An After Visit Summary was printed and given to the patient. Follow up appointments , new prescriptions and medication administration times given. Handouts on DKA given and discussed with pt and all questions answered Patient escorted via WC, and D/C home via private auto.  Cindra EvesCelano, Karysa Heft [place], RN 03/14/2013 1:54 PM

## 2013-03-14 NOTE — Discharge Summary (Signed)
Physician Discharge Summary  Gabriel Rush ZOX:[id number] DOB: [birth year] DOA: 03/11/2013  PCP: No PCP Per Patient  Admit date: 03/11/2013 Discharge date: 03/14/2013  Time spent: 40 minutes  Recommendations for Outpatient Follow-up:  1. Follow up with PCP in 1 week.  Discharge Diagnoses:  Principal Problem:   DKA (diabetic ketoacidoses) Active Problems:   SEIZURE DISORDER   HTN (hypertension)   Diarrhea   Bipolar disorder, unspecified   Discharge Condition: Stable  Diet recommendation: Carb modified diet  Filed Weights   03/12/13 0152 03/12/13 1630  Weight: 74.6 kg (164 lb 7.4 oz) 74.1 kg (163 lb 5.8 oz)    History of present illness:  53 year old male with history of alcohol abuse, pancreatitis, insulin-dependent diabetes presenting to [facility] department with main concern of several days duration of progressively worsening generalized weakness, failure to thrive, poor oral intake. Pt was recently incarcerated and was discharged about 1wk ago. He explains that he has not been taking his insulin (NPH) as he was not able to afford it and when he checked his blood sugar levels it was higher than 500. He has been without insulin for 1 wk and has had symptoms of polyuria, polydipsia and neuropathy in feet and legs.   Hospital Course:   1. Hyperglycemia: Hyperglycemia without ketosis, patient presented to the hospital with blood sugar of 623, patient does not have acidosis or evidence of ketosis that time, so started on IV insulin to decrease his blood sugar, patient also started on aggressive IV fluid hydration. His hyperglycemia secondary to nonadherence, patient said he couldn't afford to get his insulin. Patient discharged on ReliOn 70/30 insulin mix.  2. Seizure disorder: No evidence of seizure-like activity during this hospital stay, patient is on Dilantin, that is continued.  3. Diarrhea: Subacute diarrhea, patient mentioned loose stools for the past 4 weeks, he was  on the Imodium as outpatient, C. difficile is negative by PCR. He is on lactose-free diet. Patient diarrhea improved a little bit since yesterday but if he continues to have loose bowel movements he needs to followup with gastroenterology as outpatient.  4. Bipolar disorder: Patient was on Prozac and Vistaril when he was incarcerated, this was continued throughout the hospital stay.  5. Diabetes mellitus type 2, insulin-dependent: Uncontrolled hemoglobin A1c is 8.8 which correlate with MPG of 202. Patient started back on insulin, followup with primary care physician for further adjustment of insulin dosage.  Procedures:  None  Consultations:  None  Discharge Exam: Filed Vitals:   03/14/13 1040  BP: 138/96  Pulse: 116  Temp: 98 F (36.7 C)  Resp: 18   General: Alert and awake, oriented x3, not in any acute distress. HEENT: anicteric sclera, pupils reactive to light and accommodation, EOMI CVS: S1-S2 clear, no murmur rubs or gallops Chest: clear to auscultation bilaterally, no wheezing, rales or rhonchi Abdomen: soft nontender, nondistended, normal bowel sounds, no organomegaly Extremities: no cyanosis, clubbing or edema noted bilaterally Neuro: Cranial nerves II-XII intact, no focal neurological deficits  Discharge Instructions  Discharge Orders   Future Appointments Provider Department Dept Phone   04/03/2013 12:00 PM [facility] Covering Provider [facility] [phone]   04/03/2013 12:30 PM [facility] Financial Counselor [facility] [phone]   Future Orders Complete By Expires   Diet - low sodium heart healthy  As directed    Increase activity slowly  As directed        Medication List    STOP taking these  medications       ibuprofen 200 MG tablet  Commonly known as:  ADVIL,MOTRIN     insulin NPH Human 100 UNIT/ML injection  Commonly known as:  HUMULIN N,NOVOLIN N      TAKE these medications        FLUoxetine 40 MG capsule  Commonly known as:  PROZAC  Take 80 mg by mouth 2 (two) times daily.     gabapentin 300 MG capsule  Commonly known as:  NEURONTIN  Take 900 mg by mouth 3 (three) times daily.     hydrOXYzine 100 MG capsule  Commonly known as:  VISTARIL  Take 200 mg by mouth 2 (two) times daily.     insulin NPH-regular Human (70-30) 100 UNIT/ML injection  Commonly known as:  RELION 70/30  30 units with breakfast and 20 units with dinner.     loperamide 2 MG tablet  Commonly known as:  IMODIUM A-D  Take 4 mg by mouth daily as needed for diarrhea or loose stools.     phenytoin 200 MG ER capsule  Commonly known as:  DILANTIN  Take 1 capsule (200 mg total) by mouth at bedtime.       Allergies  Allergen Reactions  . Morphine And Related Itching       Follow-up Information   Follow up with [facility] On 04/03/2013. (F/U Appointment with MD- for 12:00- then appointment for eligibility for orange card at 12:30 )    Specialty:  Internal Medicine   Contact information:   [address] [place] [address] [phone]       The results of significant diagnostics from this hospitalization (including imaging, microbiology, ancillary and laboratory) are listed below for reference.    Significant Diagnostic Studies: No results found.  Microbiology: Recent Results (from the past 240 hour(s))  MRSA PCR SCREENING     Status: None   Collection Time    03/12/13  6:03 AM      Result Value Range Status   MRSA by PCR NEGATIVE  NEGATIVE Final   Comment:            The GeneXpert MRSA Assay (FDA     approved for NASAL specimens     only), is one component of a     comprehensive MRSA colonization     surveillance program. It is not     intended to diagnose MRSA     infection nor to guide or     monitor treatment for     MRSA infections.  CLOSTRIDIUM DIFFICILE BY PCR     Status: None   Collection Time    03/12/13 12:07 PM      Result  Value Range Status   C difficile by pcr NEGATIVE  NEGATIVE Final     Labs: Basic Metabolic Panel:  Recent Labs Lab 03/12/13 0218 03/12/13 0430 03/12/13 0740 03/13/13 0455 03/14/13 0716  NA 141 141 139 140 140  K 3.6* 3.6* 3.4* 3.9 4.0  CL 106 106 105 106 103  CO2 21 23 24 23 26   GLUCOSE 179* 159* 143* 105* 129*  BUN 16 16 14 13 9   CREATININE 0.94 0.94 0.90 0.90 0.93  CALCIUM 8.2* 8.1* 8.1* 8.4 8.8   Liver Function Tests: No results found for this basename: AST, ALT, ALKPHOS, BILITOT, PROT, ALBUMIN,  in the last 168 hours  Recent Labs Lab 03/12/13 0137  LIPASE 14   No results found for this basename: AMMONIA,  in the last 168 hours CBC:  Recent Labs Lab 03/11/13 1910 03/12/13 0137 03/13/13 0455  WBC 6.1 4.9 5.2  NEUTROABS 4.2  --   --   HGB 11.3* 10.3* 10.9*  HCT 32.5* 28.5* 30.8*  MCV 80.2 79.2 80.0  PLT 215 155 171   Cardiac Enzymes: No results found for this basename: CKTOTAL, CKMB, CKMBINDEX, TROPONINI,  in the last 168 hours BNP: BNP (last 3 results) No results found for this basename: PROBNP,  in the last 8760 hours CBG:  Recent Labs Lab 03/13/13 1231 03/13/13 1630 03/13/13 2141 03/14/13 0750 03/14/13 1207  GLUCAP 78 186* 223* 131* 226*       Signed:  Ronneisha Jett A  [facility] 03/14/2013, 12:57 PM

## 2013-03-26 ENCOUNTER — Inpatient Hospital Stay (HOSPITAL_COMMUNITY)
Admission: EM | Admit: 2013-03-26 | Discharge: 2013-03-28 | DRG: 638 | Disposition: A | Discharge status: Home / Self Care | Payer: Self-pay | Attending: Internal Medicine | Admitting: Internal Medicine

## 2013-03-26 ENCOUNTER — Encounter (HOSPITAL_COMMUNITY): Payer: Self-pay | Admitting: Emergency Medicine

## 2013-03-26 DIAGNOSIS — G40909 Epilepsy, unspecified, not intractable, without status epilepticus: Secondary | ICD-10-CM | POA: Diagnosis present

## 2013-03-26 DIAGNOSIS — Z9119 Patient's noncompliance with other medical treatment and regimen: Secondary | ICD-10-CM

## 2013-03-26 DIAGNOSIS — F1010 Alcohol abuse, uncomplicated: Secondary | ICD-10-CM

## 2013-03-26 DIAGNOSIS — E118 Type 2 diabetes mellitus with unspecified complications: Secondary | ICD-10-CM

## 2013-03-26 DIAGNOSIS — Z91199 Patient's noncompliance with other medical treatment and regimen due to unspecified reason: Secondary | ICD-10-CM

## 2013-03-26 DIAGNOSIS — IMO0002 Reserved for concepts with insufficient information to code with codable children: Secondary | ICD-10-CM

## 2013-03-26 DIAGNOSIS — Z87891 Personal history of nicotine dependence: Secondary | ICD-10-CM

## 2013-03-26 DIAGNOSIS — R109 Unspecified abdominal pain: Secondary | ICD-10-CM | POA: Diagnosis present

## 2013-03-26 DIAGNOSIS — E1165 Type 2 diabetes mellitus with hyperglycemia: Secondary | ICD-10-CM

## 2013-03-26 DIAGNOSIS — I10 Essential (primary) hypertension: Secondary | ICD-10-CM | POA: Diagnosis present

## 2013-03-26 DIAGNOSIS — F1410 Cocaine abuse, uncomplicated: Secondary | ICD-10-CM

## 2013-03-26 DIAGNOSIS — E1010 Type 1 diabetes mellitus with ketoacidosis without coma: Principal | ICD-10-CM | POA: Diagnosis present

## 2013-03-26 DIAGNOSIS — D649 Anemia, unspecified: Secondary | ICD-10-CM | POA: Diagnosis present

## 2013-03-26 DIAGNOSIS — F1210 Cannabis abuse, uncomplicated: Secondary | ICD-10-CM | POA: Diagnosis present

## 2013-03-26 DIAGNOSIS — F319 Bipolar disorder, unspecified: Secondary | ICD-10-CM

## 2013-03-26 DIAGNOSIS — F411 Generalized anxiety disorder: Secondary | ICD-10-CM | POA: Diagnosis present

## 2013-03-26 DIAGNOSIS — Z79899 Other long term (current) drug therapy: Secondary | ICD-10-CM

## 2013-03-26 DIAGNOSIS — K861 Other chronic pancreatitis: Secondary | ICD-10-CM

## 2013-03-26 DIAGNOSIS — F3289 Other specified depressive episodes: Secondary | ICD-10-CM

## 2013-03-26 DIAGNOSIS — Z8249 Family history of ischemic heart disease and other diseases of the circulatory system: Secondary | ICD-10-CM

## 2013-03-26 DIAGNOSIS — E871 Hypo-osmolality and hyponatremia: Secondary | ICD-10-CM

## 2013-03-26 DIAGNOSIS — E7800 Pure hypercholesterolemia, unspecified: Secondary | ICD-10-CM | POA: Diagnosis present

## 2013-03-26 DIAGNOSIS — E1049 Type 1 diabetes mellitus with other diabetic neurological complication: Secondary | ICD-10-CM | POA: Diagnosis present

## 2013-03-26 DIAGNOSIS — E1110 Type 2 diabetes mellitus with ketoacidosis without coma: Secondary | ICD-10-CM

## 2013-03-26 DIAGNOSIS — F1020 Alcohol dependence, uncomplicated: Secondary | ICD-10-CM | POA: Diagnosis present

## 2013-03-26 DIAGNOSIS — R569 Unspecified convulsions: Secondary | ICD-10-CM | POA: Diagnosis present

## 2013-03-26 DIAGNOSIS — E1065 Type 1 diabetes mellitus with hyperglycemia: Secondary | ICD-10-CM | POA: Diagnosis present

## 2013-03-26 DIAGNOSIS — E1142 Type 2 diabetes mellitus with diabetic polyneuropathy: Secondary | ICD-10-CM | POA: Diagnosis present

## 2013-03-26 DIAGNOSIS — Z794 Long term (current) use of insulin: Secondary | ICD-10-CM

## 2013-03-26 DIAGNOSIS — R197 Diarrhea, unspecified: Secondary | ICD-10-CM

## 2013-03-26 DIAGNOSIS — F329 Major depressive disorder, single episode, unspecified: Secondary | ICD-10-CM | POA: Diagnosis present

## 2013-03-26 LAB — CBC WITH DIFFERENTIAL/PLATELET
BASOS ABS: 0.1 10*3/uL (ref 0.0–0.1)
Basophils Relative: 1 % (ref 0–1)
Eosinophils Absolute: 0.1 10*3/uL (ref 0.0–0.7)
Eosinophils Relative: 1 % (ref 0–5)
HCT: 32.4 % — ABNORMAL LOW (ref 39.0–52.0)
HEMOGLOBIN: 11.4 g/dL — AB (ref 13.0–17.0)
Lymphocytes Relative: 29 % (ref 12–46)
Lymphs Abs: 2.1 10*3/uL (ref 0.7–4.0)
MCH: 27.8 pg (ref 26.0–34.0)
MCHC: 35.2 g/dL (ref 30.0–36.0)
MCV: 79 fL (ref 78.0–100.0)
Monocytes Absolute: 0.5 10*3/uL (ref 0.1–1.0)
Monocytes Relative: 7 % (ref 3–12)
NEUTROS ABS: 4.6 10*3/uL (ref 1.7–7.7)
NEUTROS PCT: 63 % (ref 43–77)
PLATELETS: 243 10*3/uL (ref 150–400)
RBC: 4.1 MIL/uL — ABNORMAL LOW (ref 4.22–5.81)
RDW: 12.2 % (ref 11.5–15.5)
WBC: 7.3 10*3/uL (ref 4.0–10.5)

## 2013-03-26 LAB — GLUCOSE, CAPILLARY

## 2013-03-26 MED ORDER — SODIUM CHLORIDE 0.9 % IV SOLN
1000.0000 mL | Freq: Once | INTRAVENOUS | Status: AC
  Administered 2013-03-27: 1000 mL via INTRAVENOUS

## 2013-03-26 MED ORDER — SODIUM CHLORIDE 0.9 % IV SOLN
1000.0000 mL | INTRAVENOUS | Status: DC
  Administered 2013-03-27: 1000 mL via INTRAVENOUS

## 2013-03-26 MED ORDER — SODIUM CHLORIDE 0.9 % IV SOLN
1000.0000 mL | Freq: Once | INTRAVENOUS | Status: AC
  Administered 2013-03-26: 1000 mL via INTRAVENOUS

## 2013-03-26 NOTE — ED Notes (Signed)
Pt states he has not had insulin for about a week and he cannot afford it right now  Pt states he has been trying to regulate his sugar through his diet  Pt states his sugar is up today

## 2013-03-26 NOTE — ED Provider Notes (Signed)
CSN: [id number]     Arrival date & time 03/26/13  2245 History   First MD Initiated Contact with Patient 03/26/13 2256     Chief Complaint  Patient presents with  . Hyperglycemia     (Consider location/radiation/quality/duration/timing/severity/associated sxs/prior Treatment) HPI Patient reports he has been having diarrhea off and on for the past 2-3 months. He states 2-3 months ago they stopped his metformin and put him on an unknown antibiotic and he then had solid stools however his diarrhea returned for the past month. He reports when he goes to the bathroom he has to sit on the toilet for 30 minutes for prolonged episodes of loose watery stool. This happens 3-4 times a day. He states he has occasional vomiting and has more nausea. He reports he has lost weight from 210 pounds 8 months ago to 170 pounds now although in triage his weight was 160 pounds. He denies fever. He states he's having crampy abdominal pain that seems to go along the course of the colon. He states he's weak and has orthostasis and if he stands up too quickly he will pass out. He reports his started having polyuria and polydipsia 2-3 weeks ago. He states a couple days ago he started eating more food because he felt like he was losing too much weight. He states he has had stool samples sent at his prior physician's office and was told that there was no infection found. He is no longer able to afford to see that doctor [facility]  PCP none  Past Medical History  Diagnosis Date  . Hypertension   . Pancreatitis   . Alcohol abuse   . Chronic abdominal pain   . Alcoholism /alcohol abuse   . Neuromuscular disorder 07/02/2012    Diabetic neuropathy  . Hypercholesteremia   . Type II diabetes mellitus   . Depression   . Diabetic peripheral neuropathy   . Grand mal     "maybe once/month; had one for a whole week just prior to coming here; black out so many times" (12/04/2012)  . Headache(784.0)     "weekly; can  get severe; never dx'd w/migraines" (12/04/2012)  . Arthritis     "hands & arms" (12/04/2012)  . Anxiety    Past Surgical History  Procedure Laterality Date  . Appendectomy  1970's   Family History  Problem Relation Age of Onset  . Hypertension    . Hypertension Mother    History  Substance Use Topics  . Smoking status: Former Smoker -- 0.50 packs/day for 8 years    Types: Cigarettes    Quit date: 12/21/1984  . Smokeless tobacco: Never Used  . Alcohol Use: 3.6 oz/week    6 Cans of beer per week     Comment: 10/28//2014 "30 day inpatient [facility] 07/2012; h/o heavy, binge drinking; last drink was 1 week ago"  applying for disability  Review of Systems  All other systems reviewed and are negative.      Allergies  Morphine and related  Home Medications   Current Outpatient Rx  Name  Route  Sig  Dispense  Refill  . FLUoxetine (PROZAC) 40 MG capsule   Oral   Take 80 mg by mouth 2 (two) times daily.         Marland Kitchen. gabapentin (NEURONTIN) 300 MG capsule   Oral   Take 900 mg by mouth 3 (three) times daily.         . hydrOXYzine (VISTARIL) 100 MG capsule  Oral   Take 200 mg by mouth 2 (two) times daily.         . insulin NPH Human (HUMULIN N,NOVOLIN N) 100 UNIT/ML injection   Subcutaneous   Inject 20 Units into the skin 2 (two) times daily before a meal.         . loperamide (IMODIUM A-D) 2 MG tablet   Oral   Take 4 mg by mouth daily as needed for diarrhea or loose stools.         . meloxicam (MOBIC) 7.5 MG tablet   Oral   Take 7.5 mg by mouth daily.         . phenytoin (DILANTIN) 200 MG ER capsule   Oral   Take 200 mg by mouth at bedtime.          BP 81/58  Pulse 110  Temp(Src) 97.7 F (36.5 C) (Oral)  Resp 20  Ht 5\' 11"  (1.803 m)  Wt 160 lb (72.576 kg)  BMI 22.33 kg/m2  SpO2 100%  Vital signs normal except for tachycardia  Physical Exam  Nursing note and vitals reviewed. Constitutional: He is oriented to person, place, and time.   Non-toxic appearance. He does not appear ill. No distress.  Thin male  HENT:  Head: Normocephalic and atraumatic.  Right Ear: External ear normal.  Left Ear: External ear normal.  Nose: Nose normal. No mucosal edema or rhinorrhea.  Mouth/Throat: Mucous membranes are normal. No dental abscesses or uvula swelling.  Tongue dry  Eyes: Conjunctivae and EOM are normal. Pupils are equal, round, and reactive to light.  Neck: Normal range of motion and full passive range of motion without pain. Neck supple.  Cardiovascular: Regular rhythm and normal heart sounds.  Tachycardia present.  Exam reveals no gallop and no friction rub.   No murmur heard. Pulmonary/Chest: Effort normal and breath sounds normal. No respiratory distress. He has no wheezes. He has no rhonchi. He has no rales. He exhibits no tenderness and no crepitus.  Abdominal: Soft. Normal appearance and bowel sounds are normal. He exhibits no distension. There is tenderness. There is no rebound and no guarding.    Musculoskeletal: Normal range of motion. He exhibits no edema and no tenderness.  Moves all extremities well.   Neurological: He is alert and oriented to person, place, and time. He has normal strength. No cranial nerve deficit.  Skin: Skin is warm, dry and intact. No rash noted. No erythema. No pallor.  Psychiatric: He has a normal mood and affect. His speech is normal and behavior is normal. His mood appears not anxious.    ED Course  Procedures (including critical care time)  Medications  insulin regular (NOVOLIN R,HUMULIN R) 1 Units/mL in sodium chloride 0.9 % 100 mL infusion (0.8 Units/hr Intravenous Rate/Dose Change 03/27/13 0546)  0.9 %  sodium chloride infusion (0 mLs Intravenous Stopped 03/27/13 0009)    Followed by  0.9 %  sodium chloride infusion (0 mLs Intravenous Stopped 03/27/13 0057)  promethazine (PHENERGAN) injection 12.5 mg (12.5 mg Intravenous Given 03/27/13 0009)   Pt's labs reviewed, he was started on  insulin drip for his hyperglycemia. He was given IV fluids for his dehydration.   01:10 Pt sleeping, no diarrhea while in ED. States he is feeling better. Advised of his glucose level and starting insulin drip. Anion gap calculated at 20.   01:36 Dr Toniann Fail, admit to step down, team 10  Labs Review Results for orders placed during the hospital encounter of 03/26/13  GLUCOSE, CAPILLARY      Result Value Ref Range   Glucose-Capillary >600 (*) 70 - 99 mg/dL  CBC WITH DIFFERENTIAL      Result Value Ref Range   WBC 7.3  4.0 - 10.5 K/uL   RBC 4.10 (*) 4.22 - 5.81 MIL/uL   Hemoglobin 11.4 (*) 13.0 - 17.0 g/dL   HCT 16.1 (*) 09.6 - 04.5 %   MCV 79.0  78.0 - 100.0 fL   MCH 27.8  26.0 - 34.0 pg   MCHC 35.2  30.0 - 36.0 g/dL   RDW 40.9  81.1 - 91.4 %   Platelets 243  150 - 400 K/uL   Neutrophils Relative % 63  43 - 77 %   Neutro Abs 4.6  1.7 - 7.7 K/uL   Lymphocytes Relative 29  12 - 46 %   Lymphs Abs 2.1  0.7 - 4.0 K/uL   Monocytes Relative 7  3 - 12 %   Monocytes Absolute 0.5  0.1 - 1.0 K/uL   Eosinophils Relative 1  0 - 5 %   Eosinophils Absolute 0.1  0.0 - 0.7 K/uL   Basophils Relative 1  0 - 1 %   Basophils Absolute 0.1  0.0 - 0.1 K/uL  COMPREHENSIVE METABOLIC PANEL      Result Value Ref Range   Sodium 126 (*) 137 - 147 mEq/L   Potassium 4.2  3.7 - 5.3 mEq/L   Chloride 86 (*) 96 - 112 mEq/L   CO2 20  19 - 32 mEq/L   Glucose, Bld 673 (*) 70 - 99 mg/dL   BUN 15  6 - 23 mg/dL   Creatinine, Ser 7.82  0.50 - 1.35 mg/dL   Calcium 9.6  8.4 - 95.6 mg/dL   Total Protein 7.8  6.0 - 8.3 g/dL   Albumin 3.8  3.5 - 5.2 g/dL   AST 18  0 - 37 U/L   ALT 31  0 - 53 U/L   Alkaline Phosphatase 111  39 - 117 U/L   Total Bilirubin 0.2 (*) 0.3 - 1.2 mg/dL   GFR calc non Af Amer 78 (*) >90 mL/min   GFR calc Af Amer >90  >90 mL/min  URINALYSIS, ROUTINE W REFLEX MICROSCOPIC      Result Value Ref Range   Color, Urine YELLOW  YELLOW   APPearance CLOUDY (*) CLEAR   Specific Gravity, Urine 1.035  (*) 1.005 - 1.030   pH 5.5  5.0 - 8.0   Glucose, UA >1000 (*) NEGATIVE mg/dL   Hgb urine dipstick NEGATIVE  NEGATIVE   Bilirubin Urine NEGATIVE  NEGATIVE   Ketones, ur NEGATIVE  NEGATIVE mg/dL   Protein, ur NEGATIVE  NEGATIVE mg/dL   Urobilinogen, UA 0.2  0.0 - 1.0 mg/dL   Nitrite NEGATIVE  NEGATIVE   Leukocytes, UA NEGATIVE  NEGATIVE  URINE MICROSCOPIC-ADD ON      Result Value Ref Range   Urine-Other       Value: NO FORMED ELEMENTS SEEN ON URINE MICROSCOPIC EXAMINATION   Laboratory interpretation all normal except hyperglycemia, glucosuria, concentrated urine c/w dehydration. Anion Gap is 20.    Imaging Review No results found.    MDM   Final diagnoses:  DKA (diabetic ketoacidoses)  Diarrhea    Plan admission  Devoria Albe, MD, Franz Dell, MD 03/27/13 [phone]

## 2013-03-26 NOTE — ED Notes (Signed)
Pt states when he goes from sitting to standing position he feels dizzy, states this has been going on for a while

## 2013-03-26 NOTE — ED Notes (Signed)
Pt states has had diarrhea for a long time, states having abdominal pain/spasms, also been out of insulin d/t not being able to afford and he can tell his blood sugar is high.

## 2013-03-27 ENCOUNTER — Encounter (HOSPITAL_COMMUNITY): Payer: Self-pay

## 2013-03-27 ENCOUNTER — Inpatient Hospital Stay (HOSPITAL_COMMUNITY): Payer: Self-pay

## 2013-03-27 DIAGNOSIS — IMO0002 Reserved for concepts with insufficient information to code with codable children: Secondary | ICD-10-CM

## 2013-03-27 DIAGNOSIS — R197 Diarrhea, unspecified: Secondary | ICD-10-CM

## 2013-03-27 DIAGNOSIS — E118 Type 2 diabetes mellitus with unspecified complications: Secondary | ICD-10-CM

## 2013-03-27 DIAGNOSIS — E1165 Type 2 diabetes mellitus with hyperglycemia: Secondary | ICD-10-CM

## 2013-03-27 DIAGNOSIS — E1110 Type 2 diabetes mellitus with ketoacidosis without coma: Secondary | ICD-10-CM

## 2013-03-27 DIAGNOSIS — Z9119 Patient's noncompliance with other medical treatment and regimen: Secondary | ICD-10-CM

## 2013-03-27 DIAGNOSIS — F319 Bipolar disorder, unspecified: Secondary | ICD-10-CM

## 2013-03-27 DIAGNOSIS — Z91199 Patient's noncompliance with other medical treatment and regimen due to unspecified reason: Secondary | ICD-10-CM

## 2013-03-27 DIAGNOSIS — E871 Hypo-osmolality and hyponatremia: Secondary | ICD-10-CM

## 2013-03-27 LAB — COMPREHENSIVE METABOLIC PANEL
ALBUMIN: 3.8 g/dL (ref 3.5–5.2)
ALK PHOS: 111 U/L (ref 39–117)
ALT: 31 U/L (ref 0–53)
AST: 18 U/L (ref 0–37)
BILIRUBIN TOTAL: 0.2 mg/dL — AB (ref 0.3–1.2)
BUN: 15 mg/dL (ref 6–23)
CHLORIDE: 86 meq/L — AB (ref 96–112)
CO2: 20 mEq/L (ref 19–32)
Calcium: 9.6 mg/dL (ref 8.4–10.5)
Creatinine, Ser: 1.08 mg/dL (ref 0.50–1.35)
GFR calc Af Amer: >90 mL/min (ref >90)
GFR calc non Af Amer: 78 mL/min — ABNORMAL LOW (ref >90)
Glucose, Bld: 673 mg/dL (ref 70–99)
POTASSIUM: 4.2 meq/L (ref 3.7–5.3)
SODIUM: 126 meq/L — AB (ref 137–147)
Total Protein: 7.8 g/dL (ref 6.0–8.3)

## 2013-03-27 LAB — GLUCOSE, CAPILLARY
GLUCOSE-CAPILLARY: 532 mg/dL — AB (ref 70–99)
GLUCOSE-CAPILLARY: 247 mg/dL — AB (ref 70–99)
GLUCOSE-CAPILLARY: 191 mg/dL — AB (ref 70–99)
GLUCOSE-CAPILLARY: 144 mg/dL — AB (ref 70–99)
GLUCOSE-CAPILLARY: 121 mg/dL — AB (ref 70–99)
GLUCOSE-CAPILLARY: 159 mg/dL — AB (ref 70–99)
Glucose-Capillary: 397 mg/dL — ABNORMAL HIGH (ref 70–99)
Glucose-Capillary: 139 mg/dL — ABNORMAL HIGH (ref 70–99)
Glucose-Capillary: 176 mg/dL — ABNORMAL HIGH (ref 70–99)
Glucose-Capillary: 168 mg/dL — ABNORMAL HIGH (ref 70–99)
Glucose-Capillary: 136 mg/dL — ABNORMAL HIGH (ref 70–99)
Glucose-Capillary: 139 mg/dL — ABNORMAL HIGH (ref 70–99)
Glucose-Capillary: 163 mg/dL — ABNORMAL HIGH (ref 70–99)
Glucose-Capillary: 317 mg/dL — ABNORMAL HIGH (ref 70–99)

## 2013-03-27 LAB — CBC WITH DIFFERENTIAL/PLATELET
Basophils Absolute: 0 10*3/uL (ref 0.0–0.1)
Basophils Relative: 1 % (ref 0–1)
EOS PCT: 3 % (ref 0–5)
Eosinophils Absolute: 0.1 10*3/uL (ref 0.0–0.7)
HEMATOCRIT: 28.6 % — AB (ref 39.0–52.0)
Hemoglobin: 9.9 g/dL — ABNORMAL LOW (ref 13.0–17.0)
LYMPHS PCT: 40 % (ref 12–46)
Lymphs Abs: 2.1 10*3/uL (ref 0.7–4.0)
MCH: 27 pg (ref 26.0–34.0)
MCHC: 34.6 g/dL (ref 30.0–36.0)
MCV: 78.1 fL (ref 78.0–100.0)
Monocytes Absolute: 0.3 10*3/uL (ref 0.1–1.0)
Monocytes Relative: 6 % (ref 3–12)
Neutro Abs: 2.6 10*3/uL (ref 1.7–7.7)
Neutrophils Relative %: 51 % (ref 43–77)
Platelets: 229 10*3/uL (ref 150–400)
RBC: 3.66 MIL/uL — ABNORMAL LOW (ref 4.22–5.81)
RDW: 12 % (ref 11.5–15.5)
WBC: 5.2 10*3/uL (ref 4.0–10.5)

## 2013-03-27 LAB — URINALYSIS, ROUTINE W REFLEX MICROSCOPIC
Bilirubin Urine: NEGATIVE
Hgb urine dipstick: NEGATIVE
KETONES UR: NEGATIVE mg/dL
LEUKOCYTES UA: NEGATIVE
Nitrite: NEGATIVE
PH: 5.5 (ref 5.0–8.0)
Protein, ur: NEGATIVE mg/dL
Specific Gravity, Urine: 1.035 — ABNORMAL HIGH (ref 1.005–1.030)
Urobilinogen, UA: 0.2 mg/dL (ref 0.0–1.0)

## 2013-03-27 LAB — BASIC METABOLIC PANEL
BUN: 10 mg/dL (ref 6–23)
BUN: 10 mg/dL (ref 6–23)
BUN: 8 mg/dL (ref 6–23)
CALCIUM: 8.9 mg/dL (ref 8.4–10.5)
CALCIUM: 8.7 mg/dL (ref 8.4–10.5)
CO2: 24 mEq/L (ref 19–32)
CO2: 23 mEq/L (ref 19–32)
CO2: 23 mEq/L (ref 19–32)
CREATININE: 0.87 mg/dL (ref 0.50–1.35)
CREATININE: 0.86 mg/dL (ref 0.50–1.35)
Calcium: 8.6 mg/dL (ref 8.4–10.5)
Chloride: 101 mEq/L (ref 96–112)
Chloride: 101 mEq/L (ref 96–112)
Chloride: 100 mEq/L (ref 96–112)
Creatinine, Ser: 0.9 mg/dL (ref 0.50–1.35)
GFR calc Af Amer: >90 mL/min (ref >90)
GFR calc non Af Amer: >90 mL/min (ref >90)
GLUCOSE: 121 mg/dL — AB (ref 70–99)
Glucose, Bld: 188 mg/dL — ABNORMAL HIGH (ref 70–99)
Glucose, Bld: 165 mg/dL — ABNORMAL HIGH (ref 70–99)
Potassium: 3.4 mEq/L — ABNORMAL LOW (ref 3.7–5.3)
Potassium: 3.5 mEq/L — ABNORMAL LOW (ref 3.7–5.3)
Potassium: 3.5 mEq/L — ABNORMAL LOW (ref 3.7–5.3)
SODIUM: 136 meq/L — AB (ref 137–147)
Sodium: 136 mEq/L — ABNORMAL LOW (ref 137–147)
Sodium: 133 mEq/L — ABNORMAL LOW (ref 137–147)

## 2013-03-27 LAB — HEPATIC FUNCTION PANEL
ALBUMIN: 3.3 g/dL — AB (ref 3.5–5.2)
ALK PHOS: 98 U/L (ref 39–117)
ALT: 27 U/L (ref 0–53)
AST: 21 U/L (ref 0–37)
Bilirubin, Direct: <0.2 mg/dL (ref 0.0–0.3)
Total Bilirubin: 0.3 mg/dL (ref 0.3–1.2)
Total Protein: 7 g/dL (ref 6.0–8.3)

## 2013-03-27 LAB — IRON AND TIBC
IRON: 64 ug/dL (ref 42–135)
SATURATION RATIOS: 22 % (ref 20–55)
TIBC: 286 ug/dL (ref 215–435)
UIBC: 222 ug/dL (ref 125–400)

## 2013-03-27 LAB — PHENYTOIN LEVEL, TOTAL

## 2013-03-27 LAB — RETICULOCYTES
RBC.: 3.66 MIL/uL — ABNORMAL LOW (ref 4.22–5.81)
RETIC CT PCT: 1.1 % (ref 0.4–3.1)
Retic Count, Absolute: 40.3 10*3/uL (ref 19.0–186.0)

## 2013-03-27 LAB — CLOSTRIDIUM DIFFICILE BY PCR: Toxigenic C. Difficile by PCR: NEGATIVE

## 2013-03-27 LAB — RAPID URINE DRUG SCREEN, HOSP PERFORMED
Amphetamines: NOT DETECTED
Barbiturates: NOT DETECTED
Benzodiazepines: NOT DETECTED
Cocaine: POSITIVE — AB
Opiates: NOT DETECTED
Tetrahydrocannabinol: NOT DETECTED

## 2013-03-27 LAB — FERRITIN: Ferritin: 340 ng/mL — ABNORMAL HIGH (ref 22–322)

## 2013-03-27 LAB — MAGNESIUM: Magnesium: 2 mg/dL (ref 1.5–2.5)

## 2013-03-27 LAB — HIV ANTIBODY (ROUTINE TESTING W REFLEX): HIV: NONREACTIVE

## 2013-03-27 LAB — VITAMIN B12: VITAMIN B 12: 851 pg/mL (ref 211–911)

## 2013-03-27 LAB — FOLATE: Folate: >20 ng/mL

## 2013-03-27 LAB — LIPASE, BLOOD: Lipase: 14 U/L (ref 11–59)

## 2013-03-27 LAB — TSH: TSH: 0.301 u[IU]/mL — ABNORMAL LOW (ref 0.350–4.500)

## 2013-03-27 LAB — URINE MICROSCOPIC-ADD ON: URINE-OTHER: NONE SEEN

## 2013-03-27 MED ORDER — KETOROLAC TROMETHAMINE 15 MG/ML IJ SOLN
15.0000 mg | Freq: Four times a day (QID) | INTRAMUSCULAR | Status: DC | PRN
  Administered 2013-03-27 – 2013-03-28 (×4): 15 mg via INTRAVENOUS
  Filled 2013-03-27 (×4): qty 1

## 2013-03-27 MED ORDER — FOLIC ACID 1 MG PO TABS
1.0000 mg | ORAL_TABLET | Freq: Every day | ORAL | Status: DC
  Administered 2013-03-27: 1 mg via ORAL
  Filled 2013-03-27 (×2): qty 1

## 2013-03-27 MED ORDER — PHENYTOIN SODIUM EXTENDED 100 MG PO CAPS
200.0000 mg | ORAL_CAPSULE | Freq: Every day | ORAL | Status: DC
  Administered 2013-03-27: 200 mg via ORAL
  Filled 2013-03-27 (×2): qty 2

## 2013-03-27 MED ORDER — INSULIN ASPART 100 UNIT/ML ~~LOC~~ SOLN
0.0000 [IU] | Freq: Three times a day (TID) | SUBCUTANEOUS | Status: DC
  Administered 2013-03-27 (×2): 3 [IU] via SUBCUTANEOUS
  Administered 2013-03-28: 11 [IU] via SUBCUTANEOUS

## 2013-03-27 MED ORDER — SODIUM CHLORIDE 0.9 % IV SOLN: INTRAVENOUS | Status: DC

## 2013-03-27 MED ORDER — GABAPENTIN 300 MG PO CAPS
900.0000 mg | ORAL_CAPSULE | Freq: Three times a day (TID) | ORAL | Status: DC
  Administered 2013-03-27 – 2013-03-28 (×3): 900 mg via ORAL
  Filled 2013-03-27 (×6): qty 3

## 2013-03-27 MED ORDER — INSULIN GLARGINE 100 UNIT/ML ~~LOC~~ SOLN
35.0000 [IU] | Freq: Every day | SUBCUTANEOUS | Status: DC
  Administered 2013-03-27: 35 [IU] via SUBCUTANEOUS
  Filled 2013-03-27: qty 0.35

## 2013-03-27 MED ORDER — LORAZEPAM 1 MG PO TABS: 1.0000 mg | ORAL_TABLET | Freq: Four times a day (QID) | ORAL | Status: DC | PRN

## 2013-03-27 MED ORDER — LORAZEPAM 2 MG/ML IJ SOLN: 1.0000 mg | Freq: Four times a day (QID) | INTRAMUSCULAR | Status: DC | PRN

## 2013-03-27 MED ORDER — SODIUM CHLORIDE 0.9 % IV SOLN
INTRAVENOUS | Status: DC
  Administered 2013-03-27: 4.7 [IU]/h via INTRAVENOUS
  Filled 2013-03-27: qty 1

## 2013-03-27 MED ORDER — SODIUM CHLORIDE 0.9 % IV SOLN
INTRAVENOUS | Status: DC
  Filled 2013-03-27: qty 1

## 2013-03-27 MED ORDER — INSULIN GLARGINE 100 UNIT/ML ~~LOC~~ SOLN
4.0000 [IU] | Freq: Once | SUBCUTANEOUS | Status: DC
  Filled 2013-03-27: qty 0.04

## 2013-03-27 MED ORDER — INSULIN ASPART 100 UNIT/ML ~~LOC~~ SOLN
0.0000 [IU] | Freq: Every day | SUBCUTANEOUS | Status: DC
  Administered 2013-03-27: 4 [IU] via SUBCUTANEOUS

## 2013-03-27 MED ORDER — ENOXAPARIN SODIUM 40 MG/0.4ML ~~LOC~~ SOLN
40.0000 mg | SUBCUTANEOUS | Status: DC
  Administered 2013-03-27: 40 mg via SUBCUTANEOUS
  Filled 2013-03-27 (×2): qty 0.4

## 2013-03-27 MED ORDER — VITAMIN B-1 100 MG PO TABS
100.0000 mg | ORAL_TABLET | Freq: Every day | ORAL | Status: DC
  Administered 2013-03-27 – 2013-03-28 (×2): 100 mg via ORAL
  Filled 2013-03-27 (×2): qty 1

## 2013-03-27 MED ORDER — IOHEXOL 300 MG/ML  SOLN
25.0000 mL | INTRAMUSCULAR | Status: AC
  Administered 2013-03-27 (×2): 25 mL via ORAL

## 2013-03-27 MED ORDER — POTASSIUM CHLORIDE 10 MEQ/100ML IV SOLN
10.0000 meq | INTRAVENOUS | Status: AC
  Administered 2013-03-27 (×4): 10 meq via INTRAVENOUS
  Filled 2013-03-27 (×4): qty 100

## 2013-03-27 MED ORDER — DEXTROSE-NACL 5-0.45 % IV SOLN
INTRAVENOUS | Status: DC
  Administered 2013-03-27: 1000 mL via INTRAVENOUS
  Administered 2013-03-27: 125 mL/h via INTRAVENOUS

## 2013-03-27 MED ORDER — PROMETHAZINE HCL 25 MG/ML IJ SOLN
12.5000 mg | Freq: Once | INTRAMUSCULAR | Status: AC
  Administered 2013-03-27: 12.5 mg via INTRAVENOUS
  Filled 2013-03-27: qty 1

## 2013-03-27 MED ORDER — THIAMINE HCL 100 MG/ML IJ SOLN
100.0000 mg | Freq: Every day | INTRAMUSCULAR | Status: DC
  Filled 2013-03-27 (×2): qty 1

## 2013-03-27 MED ORDER — INSULIN GLARGINE 100 UNIT/ML ~~LOC~~ SOLN
40.0000 [IU] | Freq: Every day | SUBCUTANEOUS | Status: DC
  Filled 2013-03-27: qty 0.4

## 2013-03-27 MED ORDER — IOHEXOL 300 MG/ML  SOLN
100.0000 mL | Freq: Once | INTRAMUSCULAR | Status: AC | PRN
  Administered 2013-03-27: 100 mL via INTRAVENOUS

## 2013-03-27 MED ORDER — FLUOXETINE HCL 20 MG PO CAPS
80.0000 mg | ORAL_CAPSULE | Freq: Two times a day (BID) | ORAL | Status: DC
  Administered 2013-03-27 – 2013-03-28 (×3): 80 mg via ORAL
  Filled 2013-03-27 (×4): qty 4

## 2013-03-27 MED ORDER — ADULT MULTIVITAMIN W/MINERALS CH
1.0000 | ORAL_TABLET | Freq: Every day | ORAL | Status: DC
  Administered 2013-03-27: 1 via ORAL
  Filled 2013-03-27 (×2): qty 1

## 2013-03-27 MED ORDER — HYDROXYZINE HCL 50 MG PO TABS
200.0000 mg | ORAL_TABLET | Freq: Two times a day (BID) | ORAL | Status: DC
  Administered 2013-03-27 – 2013-03-28 (×3): 200 mg via ORAL
  Filled 2013-03-27 (×5): qty 4

## 2013-03-27 MED ORDER — DEXTROSE 50 % IV SOLN: 25.0000 mL | INTRAVENOUS | Status: DC | PRN

## 2013-03-27 NOTE — Progress Notes (Signed)
CARE MANAGEMENT NOTE 03/27/2013  Patient:  Gabriel Rush,Gabriel Rush   Account Number:  [id number]  Date Initiated:  03/27/2013  Documentation initiated by:  Mercer Peifer  Subjective/Objective Assessment:   pt with hx of dka and non complainceof meds     Action/Plan:   home when stable/pt has been given medication assistance w/i the past year. has community resources for the uninsured.   Anticipated DC Date:  03/30/2013   Anticipated DC Plan:  HOME/SELF CARE  In-house referral  NA      DC Planning Services  CM consult  [facility]  Medication Assistance      Choice offered to / List presented to:  NA   DME arranged  NA      DME agency  NA     HH arranged  NA      HH agency  NA   Status of service:  In process, will continue to follow Medicare Important Message given?  NA - LOS <3 / Initial given by admissions (If response is "NO", the following Medicare IM given date fields will be blank) Date Medicare IM given:   Date Additional Medicare IM given:    Discharge Disposition:    Per UR Regulation:  Reviewed for med. necessity/level of care/duration of stay  If discussed at Long Length of Stay Meetings, dates discussed:    Comments:  [id number]/Kiyani Jernigan Stark JockDavis, RN, [name], [place] [phone] Chart Reviewed for discharge and hospital needs. Discharge needs at time of review:  None present will follow for needs. Review of patient progress due on [id number]. Form for patient assistance through [employer] filled out and placed on patients door for md to complete/patient sleeping unable to awake/instruction and papers for the patient to complete placed on door with hippa information hidden from view.

## 2013-03-27 NOTE — H&P (Signed)
[facility] History and Physical  Child Campoy ZOX:[id number] DOB: Jan 23, 1961 DOA: 03/26/2013  Referring physician: ER physician. PCP: No PCP Per Patient   Chief Complaint: Lightheadedness.  HPI: Gabriel Rush is a 53 y.o. male history of diabetes mellitus, seizure disorder, chronic pancreatitis, who was recently admitted to the hospital for uncontrolled diabetes presents to the ER because of lightheadedness. Patient states that over the last few days she's been feeling weak and lightheaded. Denies any chest pain or shortness of breath nausea vomiting. Patient has chronic abdominal pain and diarrhea. He states that he has not been taking his medications for diabetes as he was not able to afford it. He does say that he took one beer last evening. He states denies drinking alcohol frequently. In the ER patient was found to have blood sugars more than 600 with anion gap. Patient has been admitted for further management of his uncontrolled diabetes. On exam patient does have abdominal tenderness diffusely.   Review of Systems: As presented in the history of presenting illness, rest negative.  Past Medical History  Diagnosis Date  . Hypertension   . Pancreatitis   . Alcohol abuse   . Chronic abdominal pain   . Alcoholism /alcohol abuse   . Neuromuscular disorder 07/02/2012    Diabetic neuropathy  . Hypercholesteremia   . Type II diabetes mellitus   . Depression   . Diabetic peripheral neuropathy   . Grand mal     "maybe once/month; had one for a whole week just prior to coming here; black out so many times" (12/04/2012)  . Headache(784.0)     "weekly; can get severe; never dx'd w/migraines" (12/04/2012)  . Arthritis     "hands & arms" (12/04/2012)  . Anxiety    Past Surgical History  Procedure Laterality Date  . Appendectomy  1970's   Social History:  reports that he quit smoking about 28 years ago. His smoking use included Cigarettes. He has a 4 pack-year smoking  history. He has never used smokeless tobacco. He reports that he drinks about 3.6 ounces of alcohol per week. He reports that he uses illicit drugs (Marijuana, "Crack" cocaine, and Cocaine). Where does patient live friends home. Can patient participate in ADLs? Yes.  Allergies  Allergen Reactions  . Morphine And Related Itching    Family History:  Family History  Problem Relation Age of Onset  . Hypertension    . Hypertension Mother       Prior to Admission medications   Medication Sig Start Date End Date Taking? Authorizing Provider  FLUoxetine (PROZAC) 40 MG capsule Take 80 mg by mouth 2 (two) times daily.   Yes Historical Provider, MD  gabapentin (NEURONTIN) 300 MG capsule Take 900 mg by mouth 3 (three) times daily.   Yes Historical Provider, MD  hydrOXYzine (VISTARIL) 100 MG capsule Take 200 mg by mouth 2 (two) times daily.   Yes Historical Provider, MD  insulin NPH Human (HUMULIN N,NOVOLIN N) 100 UNIT/ML injection Inject 20 Units into the skin 2 (two) times daily before a meal.   Yes Historical Provider, MD  loperamide (IMODIUM A-D) 2 MG tablet Take 4 mg by mouth daily as needed for diarrhea or loose stools.   Yes Historical Provider, MD  meloxicam (MOBIC) 7.5 MG tablet Take 7.5 mg by mouth daily.   Yes Historical Provider, MD  phenytoin (DILANTIN) 200 MG ER capsule Take 200 mg by mouth at bedtime.   Yes Historical Provider, MD    Physical Exam: Filed Vitals:  03/27/13 0000 03/27/13 0100 03/27/13 0148 03/27/13 0200  BP:   135/87 123/86  Pulse: 116 109 108 110  Temp:   98.7 F (37.1 C)   TempSrc:   Oral   Resp:   18   Height:      Weight:      SpO2: 100% 99% 97% 97%     General:  Well-developed and moderately nourished.  Eyes: Anicteric no pallor.  ENT: No discharge from the ears eyes nose mouth.  Neck: No mass felt.  Cardiovascular: S1-S2 heard.  Respiratory: No rhonchi or crepitations.  Abdomen: Soft mild diffuse tenderness no guarding or  rigidity.  Skin: No rash.  Musculoskeletal: No edema.  Psychiatric: Not suicidal.  Neurologic: Alert awake oriented to time place and person. Moves all extremities.  Labs on Admission:  Basic Metabolic Panel:  Recent Labs Lab 03/26/13 2330  NA 126*  K 4.2  CL 86*  CO2 20  GLUCOSE 673*  BUN 15  CREATININE 1.08  CALCIUM 9.6   Liver Function Tests:  Recent Labs Lab 03/26/13 2330  AST 18  ALT 31  ALKPHOS 111  BILITOT 0.2*  PROT 7.8  ALBUMIN 3.8   No results found for this basename: LIPASE, AMYLASE,  in the last 168 hours No results found for this basename: AMMONIA,  in the last 168 hours CBC:  Recent Labs Lab 03/26/13 2330  WBC 7.3  NEUTROABS 4.6  HGB 11.4*  HCT 32.4*  MCV 79.0  PLT 243   Cardiac Enzymes: No results found for this basename: CKTOTAL, CKMB, CKMBINDEX, TROPONINI,  in the last 168 hours  BNP (last 3 results) No results found for this basename: PROBNP,  in the last 8760 hours CBG:  Recent Labs Lab 03/26/13 2250 03/27/13 0120  GLUCAP >600* 532*    Radiological Exams on Admission: No results found.   Assessment/Plan Principal Problem:   DKA (diabetic ketoacidoses) Active Problems:   SEIZURE DISORDER   Hyponatremia   Anemia   Diarrhea   1. Diabetic ketoacidosis - patient has anion gap and low bicarbonate so at this time we will treat this as diabetic ketoacidosis. Continue with IV insulin infusion and closely follow metabolic panel. Patient has had recent hemoglobin A1c checked which was around 8. Continue with aggressive fluid hydration. Transition to subcutaneous insulin once anion gap is corrected. 2. Hyponatremia and dehydration - patient was orthostatic in the ER. Continue with aggressive hydration and closely follow intake output and metabolic panel. 3. Chronic diarrhea with abdominal pain with history of chronic pancreatitis - since patient has significant tenderness in the abdomen we will check CT abdomen pelvis. Check  stool studies. Check HIV. 4. Polysubstance abuse - patient admits to have had recent alcohol. Check urine drug screen. I have placed patient on when necessary IV Ativan. Thiamine. Social work consult. 5. Anemia - check anemia panel. Closely follow CBC. 6. Seizure disorder - continue Dilantin. Check Dilantin level. 7. History of depression - continue present medications.  Have reviewed patient's old charts labs.  Code Status: Full code.  Family Communication: None.  Disposition Plan: Admit to inpatient.    Suriya Kovarik N. [facility] Pager [phone].  If 7PM-7AM, please contact night-coverage [email or web address] Password [email or web address] 03/27/2013, 3:42 AM

## 2013-03-27 NOTE — Progress Notes (Signed)
Inpatient Diabetes Program Recommendations  AACE/ADA: New Consensus Statement on Inpatient Glycemic Control (2013)  Target Ranges:  Prepandial:   less than 140 mg/dL      Peak postprandial:   less than 180 mg/dL (1-2 hours)      Critically ill patients:  140 - 180 mg/dL   Reason for Visit: Diabetes Consult  Diabetes history: Type 2 Outpatient Diabetes medications: 70/30 30 units in am and 20 units in pm Current orders for Inpatient glycemic control: Lantus 40 units QD and Novolog moderate tidwc and hs  Inpatient Diabetes Program Recommendations Correction (SSI): Decrease Novolog to sensitive tidwc and hs Insulin - Meal Coverage: Please add Novolog 5 units tidwc for meal coverage insulin HgbA1C: HgbA1C - 8.8% Outpatient Referral: [facility]  Very familiar with pt from previous admissions. Pt states he cannot afford his insulin at [facility] ($24.88) and [facility] for another 2 weeks. Will benefit from case manager and social work consult for medications. Would start pt on 70/30 30 units in am and 20 units in pm plus Novolog sensitive correction as pt will likely be discharged on same.  Will continue to follow. Thank you. Ailene Ardshonda Bernell Haynie, RD, LDN, CDE Inpatient Diabetes Coordinator [phone]

## 2013-03-27 NOTE — Progress Notes (Signed)
H and P from four hours ago reviewed. Agree with assessment and plan. Pt presents with repeat DKA in the setting of known medical noncompliance secondary to medication cost. Most recent GAP of 11. Consider transitioning to subQ insulin. Start diabetic diet. If glucose remains well controlled later, consider transfer to floor. Will follow.

## 2013-03-28 DIAGNOSIS — F3289 Other specified depressive episodes: Secondary | ICD-10-CM

## 2013-03-28 DIAGNOSIS — F1410 Cocaine abuse, uncomplicated: Secondary | ICD-10-CM

## 2013-03-28 DIAGNOSIS — F329 Major depressive disorder, single episode, unspecified: Secondary | ICD-10-CM

## 2013-03-28 DIAGNOSIS — K861 Other chronic pancreatitis: Secondary | ICD-10-CM

## 2013-03-28 DIAGNOSIS — F1010 Alcohol abuse, uncomplicated: Secondary | ICD-10-CM

## 2013-03-28 LAB — BASIC METABOLIC PANEL
BUN: 11 mg/dL (ref 6–23)
CHLORIDE: 102 meq/L (ref 96–112)
CO2: 25 mEq/L (ref 19–32)
Calcium: 8.9 mg/dL (ref 8.4–10.5)
Creatinine, Ser: 1.04 mg/dL (ref 0.50–1.35)
GFR calc non Af Amer: 81 mL/min — ABNORMAL LOW (ref >90)
Glucose, Bld: 284 mg/dL — ABNORMAL HIGH (ref 70–99)
Potassium: 3.8 mEq/L (ref 3.7–5.3)
Sodium: 138 mEq/L (ref 137–147)

## 2013-03-28 LAB — GLUCOSE, CAPILLARY: GLUCOSE-CAPILLARY: 304 mg/dL — AB (ref 70–99)

## 2013-03-28 MED ORDER — INSULIN ASPART PROT & ASPART (70-30 MIX) 100 UNIT/ML ~~LOC~~ SUSP: 30.0000 [IU] | Freq: Two times a day (BID) | SUBCUTANEOUS | Status: DC

## 2013-03-28 MED ORDER — KETOROLAC TROMETHAMINE 10 MG PO TABS: 10.0000 mg | ORAL_TABLET | Freq: Four times a day (QID) | ORAL | Status: DC | PRN

## 2013-03-28 MED ORDER — INSULIN ASPART PROT & ASPART (70-30 MIX) 100 UNIT/ML ~~LOC~~ SUSP
30.0000 [IU] | Freq: Two times a day (BID) | SUBCUTANEOUS | Status: DC
  Filled 2013-03-28: qty 10

## 2013-03-28 NOTE — Progress Notes (Signed)
CARE MANAGEMENT NOTE 03/28/2013  Patient:  Gabriel Rush,Gabriel Rush   Account Number:  [id number]  Date Initiated:  03/27/2013  Documentation initiated by:  Jerome Otter  Subjective/Objective Assessment:   pt with hx of dka and non complainceof meds     Action/Plan:   home when stable/pt has been given medication assistance w/i the past year. has community resources for the uninsured.   Anticipated DC Date:  03/30/2013   Anticipated DC Plan:  HOME/SELF CARE  In-house referral  Clinical Social Worker      DC Associate Professorlanning Services  CM consult  Indigent Health Clinic  Medication Assistance      [facility]  NA   Choice offered to / List presented to:  NA   DME arranged  NA      DME agency  NA     HH arranged  NA      HH agency  NA   Status of service:  In process, will continue to follow Medicare Important Message given?  NA - LOS <3 / Initial given by admissions (If response is "NO", the following Medicare IM given date fields will be blank) Date Medicare IM given:   Date Additional Medicare IM given:    Discharge Disposition:    Per UR Regulation:  Reviewed for med. necessity/level of care/duration of stay  If discussed at Long Length of Stay Meetings, dates discussed:    Comments:  [id number]/Octa Uplinger,RN,[name],CCM  [phone]: Patient being discharge plan is for patient to go to the community wellness clinic immediate post hospital-clinic has been notified/paper and forms for patient assistance for insulin faxed to [employer]/sealed copies given to patient for presentation to the clinic.  Patient given bus pass to get to the clinic and will get one from the clinic to go home by.  Patient is alert and fully mobile, does understand the importance of following all md instructions and medications.  [id number]/Minette Manders Earlene Plateravis, RN, [name], CCM [phone] Chart Reviewed for discharge and hospital needs. Discharge needs at time of review:  None present will follow for needs. Review of  patient progress due on [id number]. Form for patient assistance through [employer] partial filled out and placed on patients door for md to complete/patient sleeping unable to awake/instruction and papers for the patient to complete placed on door with hippa information hidden from view.

## 2013-03-28 NOTE — Discharge Summary (Signed)
Physician Discharge Summary  Gabriel Rush ZOX:[id number] DOB: 1960/08/03 DOA: 03/26/2013  PCP: No PCP Per Patient  Admit date: 03/26/2013 Discharge date: 03/28/2013  Time spent: 35 minutes  Recommendations for Outpatient Follow-up:  1. Follow up with PCP in 1-2 weeks  Discharge Diagnoses:  Principal Problem:   DKA (diabetic ketoacidoses) Active Problems:   SEIZURE DISORDER   Hyponatremia   Anemia   Diarrhea   Discharge Condition: Improved  Diet recommendation: Diabetic  Filed Weights   03/26/13 2251 03/27/13 0400 03/28/13 0400  Weight: 72.576 kg (160 lb) 71.5 kg (157 lb 10.1 oz) 73 kg (160 lb 15 oz)    History of present illness:  Gabriel Rush is a 53 y.o. male history of diabetes mellitus, seizure disorder, chronic pancreatitis, who was recently admitted to the hospital for uncontrolled diabetes presents to the ER because of lightheadedness. Patient states that over the last few days she's been feeling weak and lightheaded. Denies any chest pain or shortness of breath nausea vomiting. Patient has chronic abdominal pain and diarrhea. He states that he has not been taking his medications for diabetes as he was not able to afford it. He does say that he took one beer last evening. He states denies drinking alcohol frequently. In the ER patient was found to have blood sugars more than 600 with anion gap. Patient has been admitted for further management of his uncontrolled diabetes. On exam patient does have abdominal tenderness diffusely.    Hospital Course:  1. Diabetic ketoacidosis - patient had anion gap and low bicarbonate. Continued initially with IV insulin infusion and transitioned to subQ insulin after GAP closed. Patient has had recent hemoglobin A1c checked which was around 8. Claimed he could not afford insulin. 2. Hyponatremia and dehydration - patient was orthostatic in the ER. Continue with aggressive hydration and closely follow intake output and metabolic  panel. 3. Chronic diarrhea with abdominal pain with history of chronic pancreatitis - CT abd unremarkable for acute process. Pain was well controlled with Ketoralac. 4. Polysubstance abuse - patient admits to have had recent alcohol. Drug screen pos for cocaine. Med assistance form filled out for patient. 5. Anemia - check anemia panel. Closely follow CBC. 6. Seizure disorder - continue Dilantin. Check Dilantin level. 7. History of depression - continue present medications.  Discharge Exam: Filed Vitals:   03/27/13 2000 03/28/13 0000 03/28/13 0107 03/28/13 0400  BP: 112/80  120/81   Pulse: 97  99   Temp: 98 F (36.7 C) 98.4 F (36.9 C)  98.7 F (37.1 C)  TempSrc: Oral Oral  Oral  Resp: 12  15   Height:      Weight:    73 kg (160 lb 15 oz)  SpO2: 97%  100%     General: awake, in nad Cardiovascular: regular, s1, s2 Respiratory: normal resp effort, no wheezing  Discharge Instructions       Future Appointments Provider Department Dept Phone   04/03/2013 12:00 PM [facility] Covering Provider [facility] [phone]   04/03/2013 12:30 PM [facility] Financial Counselor [facility] [phone]       Medication List    STOP taking these medications       insulin NPH Human 100 UNIT/ML injection  Commonly known as:  HUMULIN N,NOVOLIN N      TAKE these medications       FLUoxetine 40 MG capsule  Commonly known as:  PROZAC  Take 80 mg by mouth 2 (two)  times daily.     gabapentin 300 MG capsule  Commonly known as:  NEURONTIN  Take 900 mg by mouth 3 (three) times daily.     hydrOXYzine 100 MG capsule  Commonly known as:  VISTARIL  Take 200 mg by mouth 2 (two) times daily.     insulin aspart protamine- aspart (70-30) 100 UNIT/ML injection  Commonly known as:  NOVOLOG MIX 70/30  Inject 0.3 mLs (30 Units total) into the skin 2 (two) times daily with a meal.     ketorolac 10 MG tablet  Commonly known as:  TORADOL   Take 1 tablet (10 mg total) by mouth every 6 (six) hours as needed.     loperamide 2 MG tablet  Commonly known as:  IMODIUM A-D  Take 4 mg by mouth daily as needed for diarrhea or loose stools.     meloxicam 7.5 MG tablet  Commonly known as:  MOBIC  Take 7.5 mg by mouth every evening.     phenytoin 200 MG ER capsule  Commonly known as:  DILANTIN  Take 200 mg by mouth at bedtime.       Allergies  Allergen Reactions  . Morphine And Related Itching   Follow-up Information   Follow up with follow up with your PCP in 1-2 weeks. Schedule an appointment as soon as possible for a visit in 1 week.       The results of significant diagnostics from this hospitalization (including imaging, microbiology, ancillary and laboratory) are listed below for reference.    Significant Diagnostic Studies: Ct Abdomen Pelvis W Contrast  03/27/2013   CLINICAL DATA:  Abdominal pain.  Diarrhea.  Chronic pancreatitis.  EXAM: CT ABDOMEN AND PELVIS WITH CONTRAST  TECHNIQUE: Multidetector CT imaging of the abdomen and pelvis was performed using the standard protocol following bolus administration of intravenous contrast.  CONTRAST:  100mL OMNIPAQUE IOHEXOL 300 MG/ML  SOLN  COMPARISON:  05/30/2012  FINDINGS: Pancreatic atrophy and mild ductal dilatation is again demonstrated. There is no evidence of acute peripancreatic inflammatory changes or pancreatic calcifications. No pancreatic mass identified. Previously seen small fluid collection adjacent to the pancreatic body is no longer visualized, consistent with a resolving pseudocyst.  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 No evidence of splenomegaly. The liver and gallbladder are normal in appearance. No evidence of biliary ductal dilatation. Adrenal glands and kidneys are also normal in appearance.  No soft tissue masses or lymphadenopathy identified within the abdomen or pelvis. No  evidence of other inflammatory process or abnormal fluid collections. No evidence of bowel wall thickening or dilatation.  IMPRESSION: Resolution of previously seen small peripancreatic pseudocyst. 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 No acute findings.   Electronically Signed   By: Myles RosenthalJohn  Stahl M.D.   On: 03/27/2013 11:30    Microbiology: Recent Results (from the past 240 hour(s))  CLOSTRIDIUM DIFFICILE BY PCR     Status: None   Collection Time    03/27/13 10:33 AM      Result Value Ref Range Status   C difficile by pcr NEGATIVE  NEGATIVE Final   Comment[facility]     Labs: Basic Metabolic Panel:  Recent Labs Lab 03/26/13 2330 03/27/13 0625 03/27/13 0840 03/27/13 1420 03/28/13 0325  NA 126* 136* 136* 133* 138  K 4.2 3.4* 3.5* 3.5* 3.8  CL 86* 101 101 100 102  CO2 20  24 23 23 25   GLUCOSE 673* 121* 188* 165* 284*  BUN 15 10 10 8 11   CREATININE 1.08 0.90 0.87 0.86 1.04  CALCIUM 9.6 8.9 8.6 8.7 8.9  MG  --  2.0  --   --   --    Liver Function Tests:  Recent Labs Lab 03/26/13 2330 03/27/13 0625  AST 18 21  ALT 31 27  ALKPHOS 111 98  BILITOT 0.2* 0.3  PROT 7.8 7.0  ALBUMIN 3.8 3.3*    Recent Labs Lab 03/27/13 0625  LIPASE 14   No results found for this basename: AMMONIA,  in the last 168 hours CBC:  Recent Labs Lab 03/26/13 2330 03/27/13 0625  WBC 7.3 5.2  NEUTROABS 4.6 2.6  HGB 11.4* 9.9*  HCT 32.4* 28.6*  MCV 79.0 78.1  PLT 243 229   Cardiac Enzymes: No results found for this basename: CKTOTAL, CKMB, CKMBINDEX, TROPONINI,  in the last 168 hours BNP: BNP (last 3 results) No results found for this basename: PROBNP,  in the last 8760 hours CBG:  Recent Labs Lab 03/27/13 1301 03/27/13 1410 03/27/13 1754 03/27/13 2154 03/28/13 0810  GLUCAP 139* 159* 163* 317* 304*    Signed:  CHIU, STEPHEN K  [facility] 03/28/2013, 11:50 AM

## 2013-03-31 LAB — STOOL CULTURE

## 2013-04-03 ENCOUNTER — Ambulatory Visit: Payer: Self-pay

## 2013-04-03 ENCOUNTER — Inpatient Hospital Stay: Payer: Self-pay

## 2013-04-09 DIAGNOSIS — F329 Major depressive disorder, single episode, unspecified: Secondary | ICD-10-CM | POA: Insufficient documentation

## 2013-04-09 DIAGNOSIS — Z9119 Patient's noncompliance with other medical treatment and regimen: Secondary | ICD-10-CM | POA: Insufficient documentation

## 2013-04-09 DIAGNOSIS — F1020 Alcohol dependence, uncomplicated: Secondary | ICD-10-CM | POA: Insufficient documentation

## 2013-04-09 DIAGNOSIS — F411 Generalized anxiety disorder: Secondary | ICD-10-CM | POA: Insufficient documentation

## 2013-04-09 DIAGNOSIS — R197 Diarrhea, unspecified: Secondary | ICD-10-CM | POA: Insufficient documentation

## 2013-04-09 DIAGNOSIS — Z91199 Patient's noncompliance with other medical treatment and regimen due to unspecified reason: Secondary | ICD-10-CM | POA: Insufficient documentation

## 2013-04-09 DIAGNOSIS — R51 Headache: Secondary | ICD-10-CM | POA: Insufficient documentation

## 2013-04-09 DIAGNOSIS — I10 Essential (primary) hypertension: Secondary | ICD-10-CM | POA: Insufficient documentation

## 2013-04-09 DIAGNOSIS — Z794 Long term (current) use of insulin: Secondary | ICD-10-CM | POA: Insufficient documentation

## 2013-04-09 DIAGNOSIS — F3289 Other specified depressive episodes: Secondary | ICD-10-CM | POA: Insufficient documentation

## 2013-04-09 DIAGNOSIS — E1142 Type 2 diabetes mellitus with diabetic polyneuropathy: Secondary | ICD-10-CM | POA: Insufficient documentation

## 2013-04-09 DIAGNOSIS — K861 Other chronic pancreatitis: Secondary | ICD-10-CM | POA: Insufficient documentation

## 2013-04-09 DIAGNOSIS — Z87891 Personal history of nicotine dependence: Secondary | ICD-10-CM | POA: Insufficient documentation

## 2013-04-09 DIAGNOSIS — E7800 Pure hypercholesterolemia, unspecified: Secondary | ICD-10-CM | POA: Insufficient documentation

## 2013-04-09 DIAGNOSIS — E1149 Type 2 diabetes mellitus with other diabetic neurological complication: Principal | ICD-10-CM | POA: Insufficient documentation

## 2013-04-09 DIAGNOSIS — M19049 Primary osteoarthritis, unspecified hand: Secondary | ICD-10-CM | POA: Insufficient documentation

## 2013-04-10 ENCOUNTER — Observation Stay (HOSPITAL_COMMUNITY)
Admission: EM | Admit: 2013-04-10 | Discharge: 2013-04-11 | Disposition: A | Discharge status: Home / Self Care | Payer: Self-pay | Attending: Internal Medicine | Admitting: Internal Medicine

## 2013-04-10 ENCOUNTER — Encounter (HOSPITAL_COMMUNITY): Payer: Self-pay | Admitting: Emergency Medicine

## 2013-04-10 DIAGNOSIS — E1165 Type 2 diabetes mellitus with hyperglycemia: Secondary | ICD-10-CM | POA: Diagnosis present

## 2013-04-10 DIAGNOSIS — K861 Other chronic pancreatitis: Secondary | ICD-10-CM

## 2013-04-10 DIAGNOSIS — R7309 Other abnormal glucose: Secondary | ICD-10-CM

## 2013-04-10 DIAGNOSIS — E118 Type 2 diabetes mellitus with unspecified complications: Secondary | ICD-10-CM

## 2013-04-10 DIAGNOSIS — D649 Anemia, unspecified: Secondary | ICD-10-CM

## 2013-04-10 DIAGNOSIS — R739 Hyperglycemia, unspecified: Secondary | ICD-10-CM | POA: Diagnosis present

## 2013-04-10 DIAGNOSIS — Z9119 Patient's noncompliance with other medical treatment and regimen: Secondary | ICD-10-CM

## 2013-04-10 DIAGNOSIS — IMO0002 Reserved for concepts with insufficient information to code with codable children: Secondary | ICD-10-CM

## 2013-04-10 DIAGNOSIS — Z91199 Patient's noncompliance with other medical treatment and regimen due to unspecified reason: Secondary | ICD-10-CM

## 2013-04-10 LAB — GLUCOSE, CAPILLARY
GLUCOSE-CAPILLARY: 246 mg/dL — AB (ref 70–99)
GLUCOSE-CAPILLARY: 170 mg/dL — AB (ref 70–99)
GLUCOSE-CAPILLARY: 176 mg/dL — AB (ref 70–99)
Glucose-Capillary: 524 mg/dL — ABNORMAL HIGH (ref 70–99)
Glucose-Capillary: 182 mg/dL — ABNORMAL HIGH (ref 70–99)
Glucose-Capillary: 160 mg/dL — ABNORMAL HIGH (ref 70–99)
Glucose-Capillary: 132 mg/dL — ABNORMAL HIGH (ref 70–99)

## 2013-04-10 LAB — CBC WITH DIFFERENTIAL/PLATELET
Basophils Absolute: 0 10*3/uL (ref 0.0–0.1)
Basophils Relative: 1 % (ref 0–1)
Eosinophils Absolute: 0 10*3/uL (ref 0.0–0.7)
Eosinophils Relative: 1 % (ref 0–5)
HCT: 29.3 % — ABNORMAL LOW (ref 39.0–52.0)
HEMOGLOBIN: 10.3 g/dL — AB (ref 13.0–17.0)
LYMPHS ABS: 0.9 10*3/uL (ref 0.7–4.0)
Lymphocytes Relative: 21 % (ref 12–46)
MCH: 28.8 pg (ref 26.0–34.0)
MCHC: 35.2 g/dL (ref 30.0–36.0)
MCV: 81.8 fL (ref 78.0–100.0)
Monocytes Absolute: 0.4 10*3/uL (ref 0.1–1.0)
Monocytes Relative: 10 % (ref 3–12)
NEUTROS ABS: 3 10*3/uL (ref 1.7–7.7)
NEUTROS PCT: 68 % (ref 43–77)
Platelets: 162 10*3/uL (ref 150–400)
RBC: 3.58 MIL/uL — ABNORMAL LOW (ref 4.22–5.81)
RDW: 12.8 % (ref 11.5–15.5)
WBC: 4.4 10*3/uL (ref 4.0–10.5)

## 2013-04-10 LAB — BASIC METABOLIC PANEL
BUN: 16 mg/dL (ref 6–23)
BUN: 15 mg/dL (ref 6–23)
BUN: 13 mg/dL (ref 6–23)
BUN: 12 mg/dL (ref 6–23)
BUN: 12 mg/dL (ref 6–23)
CHLORIDE: 94 meq/L — AB (ref 96–112)
CHLORIDE: 95 meq/L — AB (ref 96–112)
CHLORIDE: 98 meq/L (ref 96–112)
CHLORIDE: 102 meq/L (ref 96–112)
CO2: 23 mEq/L (ref 19–32)
CO2: 24 mEq/L (ref 19–32)
CO2: 24 mEq/L (ref 19–32)
CO2: 24 mEq/L (ref 19–32)
CO2: 24 mEq/L (ref 19–32)
CREATININE: 0.9 mg/dL (ref 0.50–1.35)
Calcium: 9 mg/dL (ref 8.4–10.5)
Calcium: 8.7 mg/dL (ref 8.4–10.5)
Calcium: 8.8 mg/dL (ref 8.4–10.5)
Calcium: 8.7 mg/dL (ref 8.4–10.5)
Calcium: 8.6 mg/dL (ref 8.4–10.5)
Chloride: 99 mEq/L (ref 96–112)
Creatinine, Ser: 0.97 mg/dL (ref 0.50–1.35)
Creatinine, Ser: 0.91 mg/dL (ref 0.50–1.35)
Creatinine, Ser: 0.93 mg/dL (ref 0.50–1.35)
Creatinine, Ser: 1.04 mg/dL (ref 0.50–1.35)
GFR calc Af Amer: >90 mL/min (ref >90)
GFR calc Af Amer: >90 mL/min (ref >90)
GFR calc Af Amer: >90 mL/min (ref >90)
GFR calc non Af Amer: >90 mL/min (ref >90)
GFR calc non Af Amer: >90 mL/min (ref >90)
GFR calc non Af Amer: 81 mL/min — ABNORMAL LOW (ref >90)
GLUCOSE: 192 mg/dL — AB (ref 70–99)
Glucose, Bld: 340 mg/dL — ABNORMAL HIGH (ref 70–99)
Glucose, Bld: 200 mg/dL — ABNORMAL HIGH (ref 70–99)
Glucose, Bld: 144 mg/dL — ABNORMAL HIGH (ref 70–99)
Glucose, Bld: 120 mg/dL — ABNORMAL HIGH (ref 70–99)
POTASSIUM: 3.7 meq/L (ref 3.7–5.3)
POTASSIUM: 3.6 meq/L — AB (ref 3.7–5.3)
POTASSIUM: 3.7 meq/L (ref 3.7–5.3)
Potassium: 3.7 mEq/L (ref 3.7–5.3)
Potassium: 3.5 mEq/L — ABNORMAL LOW (ref 3.7–5.3)
SODIUM: 134 meq/L — AB (ref 137–147)
SODIUM: 136 meq/L — AB (ref 137–147)
SODIUM: 135 meq/L — AB (ref 137–147)
Sodium: 132 mEq/L — ABNORMAL LOW (ref 137–147)
Sodium: 137 mEq/L (ref 137–147)

## 2013-04-10 LAB — COMPREHENSIVE METABOLIC PANEL
ALK PHOS: 107 U/L (ref 39–117)
ALT: 21 U/L (ref 0–53)
AST: 14 U/L (ref 0–37)
Albumin: 3.3 g/dL — ABNORMAL LOW (ref 3.5–5.2)
BILIRUBIN TOTAL: 0.2 mg/dL — AB (ref 0.3–1.2)
BUN: 19 mg/dL (ref 6–23)
CHLORIDE: 83 meq/L — AB (ref 96–112)
CO2: 22 mEq/L (ref 19–32)
Calcium: 8.4 mg/dL (ref 8.4–10.5)
Creatinine, Ser: 1.03 mg/dL (ref 0.50–1.35)
GFR calc non Af Amer: 82 mL/min — ABNORMAL LOW (ref >90)
GLUCOSE: 874 mg/dL — AB (ref 70–99)
POTASSIUM: 4.3 meq/L (ref 3.7–5.3)
Sodium: 120 mEq/L — CL (ref 137–147)
Total Protein: 7.1 g/dL (ref 6.0–8.3)

## 2013-04-10 LAB — CBG MONITORING, ED
Glucose-Capillary: >600 mg/dL (ref 70–99)
Glucose-Capillary: >600 mg/dL (ref 70–99)

## 2013-04-10 LAB — LIPASE, BLOOD: LIPASE: 13 U/L (ref 11–59)

## 2013-04-10 MED ORDER — DEXTROSE-NACL 5-0.45 % IV SOLN
INTRAVENOUS | Status: DC
  Administered 2013-04-10: 06:00:00 via INTRAVENOUS

## 2013-04-10 MED ORDER — LOPERAMIDE HCL 2 MG PO TABS: 4.0000 mg | ORAL_TABLET | Freq: Every day | ORAL | Status: DC | PRN

## 2013-04-10 MED ORDER — FLUOXETINE HCL 20 MG PO CAPS
80.0000 mg | ORAL_CAPSULE | Freq: Two times a day (BID) | ORAL | Status: DC
  Administered 2013-04-10 – 2013-04-11 (×2): 80 mg via ORAL
  Filled 2013-04-10 (×5): qty 4

## 2013-04-10 MED ORDER — GABAPENTIN 300 MG PO CAPS
900.0000 mg | ORAL_CAPSULE | Freq: Three times a day (TID) | ORAL | Status: DC
  Administered 2013-04-10 – 2013-04-11 (×6): 900 mg via ORAL
  Filled 2013-04-10 (×7): qty 3

## 2013-04-10 MED ORDER — HEPARIN SODIUM (PORCINE) 5000 UNIT/ML IJ SOLN
5000.0000 [IU] | Freq: Three times a day (TID) | INTRAMUSCULAR | Status: DC
  Administered 2013-04-10 – 2013-04-11 (×5): 5000 [IU] via SUBCUTANEOUS
  Filled 2013-04-10 (×7): qty 1

## 2013-04-10 MED ORDER — PHENYTOIN SODIUM EXTENDED 100 MG PO CAPS
200.0000 mg | ORAL_CAPSULE | Freq: Every day | ORAL | Status: DC
  Administered 2013-04-10: 200 mg via ORAL
  Filled 2013-04-10 (×2): qty 2

## 2013-04-10 MED ORDER — MELOXICAM 7.5 MG PO TABS
7.5000 mg | ORAL_TABLET | Freq: Every evening | ORAL | Status: DC
  Administered 2013-04-10 – 2013-04-11 (×2): 7.5 mg via ORAL
  Filled 2013-04-10 (×2): qty 1

## 2013-04-10 MED ORDER — LOPERAMIDE HCL 2 MG PO CAPS
4.0000 mg | ORAL_CAPSULE | ORAL | Status: DC | PRN
  Filled 2013-04-10: qty 2

## 2013-04-10 MED ORDER — SODIUM CHLORIDE 0.9 % IV BOLUS (SEPSIS)
1000.0000 mL | Freq: Once | INTRAVENOUS | Status: AC
  Administered 2013-04-10: 1000 mL via INTRAVENOUS

## 2013-04-10 MED ORDER — SODIUM CHLORIDE 0.9 % IV SOLN
INTRAVENOUS | Status: DC
  Administered 2013-04-10: 5.4 [IU]/h via INTRAVENOUS
  Filled 2013-04-10: qty 1

## 2013-04-10 MED ORDER — KETOROLAC TROMETHAMINE 30 MG/ML IJ SOLN
30.0000 mg | Freq: Four times a day (QID) | INTRAMUSCULAR | Status: DC | PRN
  Administered 2013-04-10 – 2013-04-11 (×5): 30 mg via INTRAVENOUS
  Filled 2013-04-10 (×5): qty 1

## 2013-04-10 MED ORDER — POTASSIUM CHLORIDE CRYS ER 20 MEQ PO TBCR
40.0000 meq | EXTENDED_RELEASE_TABLET | Freq: Once | ORAL | Status: AC
  Administered 2013-04-10: 40 meq via ORAL
  Filled 2013-04-10: qty 2

## 2013-04-10 MED ORDER — INSULIN ASPART 100 UNIT/ML ~~LOC~~ SOLN
0.0000 [IU] | Freq: Three times a day (TID) | SUBCUTANEOUS | Status: DC
  Administered 2013-04-10 (×3): 2 [IU] via SUBCUTANEOUS
  Administered 2013-04-11: 1 [IU] via SUBCUTANEOUS
  Administered 2013-04-11: 7 [IU] via SUBCUTANEOUS
  Administered 2013-04-11: 3 [IU] via SUBCUTANEOUS

## 2013-04-10 MED ORDER — SODIUM CHLORIDE 0.9 % IV SOLN
INTRAVENOUS | Status: DC
  Administered 2013-04-10: 04:00:00 via INTRAVENOUS

## 2013-04-10 MED ORDER — GABAPENTIN 300 MG PO CAPS
900.0000 mg | ORAL_CAPSULE | Freq: Three times a day (TID) | ORAL | Status: DC
  Filled 2013-04-10 (×3): qty 3

## 2013-04-10 MED ORDER — INSULIN ASPART PROT & ASPART (70-30 MIX) 100 UNIT/ML ~~LOC~~ SUSP
20.0000 [IU] | Freq: Two times a day (BID) | SUBCUTANEOUS | Status: DC
  Administered 2013-04-10 – 2013-04-11 (×4): 20 [IU] via SUBCUTANEOUS
  Filled 2013-04-10: qty 10

## 2013-04-10 MED ORDER — INSULIN REGULAR BOLUS VIA INFUSION
0.0000 [IU] | Freq: Three times a day (TID) | INTRAVENOUS | Status: DC
  Filled 2013-04-10: qty 10

## 2013-04-10 MED ORDER — PANCRELIPASE (LIP-PROT-AMYL) 12000-38000 UNITS PO CPEP
1.0000 | ORAL_CAPSULE | Freq: Three times a day (TID) | ORAL | Status: DC
  Administered 2013-04-10 – 2013-04-11 (×6): 1 via ORAL
  Filled 2013-04-10 (×7): qty 1

## 2013-04-10 MED ORDER — HYDROXYZINE HCL 50 MG PO TABS
200.0000 mg | ORAL_TABLET | Freq: Two times a day (BID) | ORAL | Status: DC
  Administered 2013-04-10 – 2013-04-11 (×3): 200 mg via ORAL
  Filled 2013-04-10 (×4): qty 4

## 2013-04-10 MED ORDER — DEXTROSE 50 % IV SOLN: 25.0000 mL | INTRAVENOUS | Status: DC | PRN

## 2013-04-10 NOTE — Progress Notes (Signed)
Pt admitted to the unit. Pt is alert and oriented x 4. Pt oriented to room, staff, and call bell. Educated on fall safety plan. Bed in lowest position. Full assessment to Epic. Call bell with in reach. Educated to call for assists. Will continue to monitor. Burley SaverKami Kamarri Fischetti, RN

## 2013-04-10 NOTE — Progress Notes (Signed)
Inpatient Diabetes Program Recommendations  AACE/ADA: New Consensus Statement on Inpatient Glycemic Control (2013)  Target Ranges:  Prepandial:   less than 140 mg/dL      Peak postprandial:   less than 180 mg/dL (1-2 hours)      Critically ill patients:  140 - 180 mg/dL     **Admitted with Glucose of 874 mg/dl.  Patient states he ran out of insulin one week ago.  **Patient is well known to the Inpatient Diabetes Program and has been seen multiple times by our team. The patient has many social issues that make Diabetes management difficult.  Patient was admitted 7 times in 2014 for DKA, Hyperglycemia, ETOH intoxication, Abdominal pain.  Has already had 2 admissions this year (both in February) for the same issues.  Per previous admissions, patient states he is homeless.  Admits to using illegal drugs (cocaine, crack, marijuana) and always admits to regular ETOH abuse.  **Patient has no insurance.  Was placed on simple insulin regimen of 70/30 insulin bid (70/30 insulin [facility] purchased [facility]  Patient states he cannot afford to purchase this insulin.  I am unsure what else to recommend for patient at this point.  He will purchase ETOH and drugs but refuses to purchase insulin and CBG meter supplies.  **Note patient has been transitioned off IV insulin to 70/30 insulin + Novolog SSI this morning.    Will follow and assist with in-hospital insulin recommendations.  Ambrose FinlandJeannine Johnston Montravious Weigelt RN, MSN, CDE Diabetes Coordinator Inpatient Diabetes Program Team Pager: [phone] (8a-10p)

## 2013-04-10 NOTE — ED Notes (Signed)
No insulin in about 1 week, neuropathy is bothering him and diarrhea constantly.  Numerous other complaints

## 2013-04-10 NOTE — Progress Notes (Signed)
Patient seen and examined.  Alert, answering questions.  Willing to eat.   1-Hyperglycemia, uncontrolled diabetes; no complaint with medications.  Stop IV fluids. CBG in the 170. Transition from insulin Gtt to 70/30 NPH.  Diabetes, CM, SW consulted.   2-Neuropathy; resume gabapentin.  3-Chronic diarrhea; start pancreatic enzymes.   Belkys Regalado, Md.

## 2013-04-10 NOTE — Progress Notes (Signed)
UR completed 

## 2013-04-10 NOTE — ED Provider Notes (Signed)
CSN: [id number]     Arrival date & time 04/09/13  2342 History   First MD Initiated Contact with Patient 04/10/13 0141     Chief Complaint  Patient presents with  . Diarrhea  . Peripheral Neuropathy     (Consider location/radiation/quality/duration/timing/severity/associated sxs/prior Treatment) HPI Comments: Patient is a 53 year old male with history of diabetes. Presents today with complaints of an exacerbation of peripheral neuropathy he states started one week ago when he ran out of his insulin. He has not had any of this in the past week. He reports diarrhea and frequent urination. He reports pain all over and generalized malaise. There is no fevers and no chills.  The history is provided by the patient.    Past Medical History  Diagnosis Date  . Hypertension   . Pancreatitis   . Alcohol abuse   . Chronic abdominal pain   . Alcoholism /alcohol abuse   . Neuromuscular disorder 07/02/2012    Diabetic neuropathy  . Hypercholesteremia   . Type II diabetes mellitus   . Depression   . Diabetic peripheral neuropathy   . Grand mal     "maybe once/month; had one for a whole week just prior to coming here; black out so many times" (12/04/2012)  . Headache(784.0)     "weekly; can get severe; never dx'd w/migraines" (12/04/2012)  . Arthritis     "hands & arms" (12/04/2012)  . Anxiety    Past Surgical History  Procedure Laterality Date  . Appendectomy  1970's   Family History  Problem Relation Age of Onset  . Hypertension    . Hypertension Mother    History  Substance Use Topics  . Smoking status: Former Smoker -- 0.50 packs/day for 8 years    Types: Cigarettes    Quit date: 12/21/1984  . Smokeless tobacco: Never Used  . Alcohol Use: 3.6 oz/week    6 Cans of beer per week     Comment: 10/28//2014 "30 day inpatient [facility] 07/2012; h/o heavy, binge drinking; last drink was 1 week ago"    Review of Systems  All other systems reviewed and are negative.      Allergies   Morphine and related  Home Medications   Current Outpatient Rx  Name  Route  Sig  Dispense  Refill  . FLUoxetine (PROZAC) 40 MG capsule   Oral   Take 80 mg by mouth 2 (two) times daily.         Marland Kitchen. gabapentin (NEURONTIN) 300 MG capsule   Oral   Take 900 mg by mouth 3 (three) times daily.         . hydrOXYzine (VISTARIL) 100 MG capsule   Oral   Take 200 mg by mouth 2 (two) times daily.         . insulin aspart protamine- aspart (NOVOLOG MIX 70/30) (70-30) 100 UNIT/ML injection   Subcutaneous   Inject 0.3 mLs (30 Units total) into the skin 2 (two) times daily with a meal.   10 mL   11   . ketorolac (TORADOL) 10 MG tablet   Oral   Take 1 tablet (10 mg total) by mouth every 6 (six) hours as needed.   20 tablet   0   . loperamide (IMODIUM A-D) 2 MG tablet   Oral   Take 4 mg by mouth daily as needed for diarrhea or loose stools.         . meloxicam (MOBIC) 7.5 MG tablet   Oral   Take  7.5 mg by mouth every evening.          . phenytoin (DILANTIN) 200 MG ER capsule   Oral   Take 200 mg by mouth at bedtime.          BP 116/87  Pulse 107  Temp(Src) 97.6 F (36.4 C) (Oral)  Resp 12  SpO2 98% Physical Exam  Nursing note and vitals reviewed. Constitutional: He is oriented to person, place, and time. He appears well-developed and well-nourished.  HENT:  Head: Normocephalic.  Mucous membranes are somewhat dry.  Neck: Normal range of motion. Neck supple.  Cardiovascular: Normal rate, regular rhythm and normal heart sounds.   No murmur heard. Pulmonary/Chest: Effort normal and breath sounds normal. No respiratory distress. He has no wheezes.  Abdominal: Soft. Bowel sounds are normal. He exhibits no distension. There is no tenderness.  Musculoskeletal: Normal range of motion. He exhibits no edema.  Neurological: He is alert and oriented to person, place, and time.  Skin: Skin is warm and dry. He is not diaphoretic.    ED Course  Procedures (including  critical care time) Labs Review Labs Reviewed  CBC WITH DIFFERENTIAL - Abnormal; Notable for the following:    RBC 3.58 (*)    Hemoglobin 10.3 (*)    HCT 29.3 (*)    All other components within normal limits  COMPREHENSIVE METABOLIC PANEL - Abnormal; Notable for the following:    Sodium 120 (*)    Chloride 83 (*)    Glucose, Bld 874 (*)    Albumin 3.3 (*)    Total Bilirubin 0.2 (*)    GFR calc non Af Amer 82 (*)    All other components within normal limits  LIPASE, BLOOD   Imaging Review No results found.   EKG Interpretation None      MDM   Final diagnoses:  None    Workup reveals a markedly elevated glucose however electrolytes do not reflect ketoacidosis. He was placed on the glucose stabilizer and I believe will require admission for stabilization of his blood sugars. Dr. Julian Reil agrees to admit.    Geoffery Lyons, MD 04/10/13 [phone]

## 2013-04-10 NOTE — Progress Notes (Addendum)
Pt's insulin drip discontinued per MD order. Pt was given his scheduled 70/30 insulin. Will cont to monitor.

## 2013-04-10 NOTE — H&P (Addendum)
[facility] History and Physical  Gabriel Rush ZOX:[id number] DOB: August 24, 1960 DOA: 04/10/2013  Referring physician: EDP PCP: No PCP Per Patient   Chief Complaint: Peripheral neuropathy   HPI: Gabriel Rush is a 53 y.o. male who is well known to our service.  He states he ran out of his insulin one week ago, and has had polyuria, diarrhea, polydipsia, and worsening diabetic neuropathy since that time.  He is frequently admitted for nonadhering to insulin therapy resulting in DKA (about once a month on average).  Unsurprisingly the patients BGL was 872 in the ED, he was not in DKA.  Insulin gtt started and hospitalist asked to admit.  Review of Systems: Systems reviewed.  As above, otherwise negative  Past Medical History  Diagnosis Date  . Hypertension   . Pancreatitis   . Alcohol abuse   . Chronic abdominal pain   . Alcoholism /alcohol abuse   . Neuromuscular disorder 07/02/2012    Diabetic neuropathy  . Hypercholesteremia   . Type II diabetes mellitus   . Depression   . Diabetic peripheral neuropathy   . Grand mal     "maybe once/month; had one for a whole week just prior to coming here; black out so many times" (12/04/2012)  . Headache(784.0)     "weekly; can get severe; never dx'd w/migraines" (12/04/2012)  . Arthritis     "hands & arms" (12/04/2012)  . Anxiety    Past Surgical History  Procedure Laterality Date  . Appendectomy  1970's   Social History:  reports that he quit smoking about 28 years ago. His smoking use included Cigarettes. He has a 4 pack-year smoking history. He has never used smokeless tobacco. He reports that he drinks about 3.6 ounces of alcohol per week. He reports that he uses illicit drugs (Marijuana, "Crack" cocaine, and Cocaine).  Allergies  Allergen Reactions  . Morphine And Related Itching    Family History  Problem Relation Age of Onset  . Hypertension    . Hypertension Mother      Prior to Admission medications    Medication Sig Start Date End Date Taking? Authorizing Provider  FLUoxetine (PROZAC) 40 MG capsule Take 80 mg by mouth 2 (two) times daily.   Yes Historical Provider, MD  gabapentin (NEURONTIN) 300 MG capsule Take 900 mg by mouth 3 (three) times daily.   Yes Historical Provider, MD  hydrOXYzine (VISTARIL) 100 MG capsule Take 200 mg by mouth 2 (two) times daily.   Yes Historical Provider, MD  insulin aspart protamine- aspart (NOVOLOG MIX 70/30) (70-30) 100 UNIT/ML injection Inject 0.3 mLs (30 Units total) into the skin 2 (two) times daily with a meal. 03/28/13  Yes Jerald Kief, MD  ketorolac (TORADOL) 10 MG tablet Take 1 tablet (10 mg total) by mouth every 6 (six) hours as needed. 03/28/13  Yes Jerald Kief, MD  loperamide (IMODIUM A-D) 2 MG tablet Take 4 mg by mouth daily as needed for diarrhea or loose stools.   Yes Historical Provider, MD  meloxicam (MOBIC) 7.5 MG tablet Take 7.5 mg by mouth every evening.    Yes Historical Provider, MD  phenytoin (DILANTIN) 200 MG ER capsule Take 200 mg by mouth at bedtime.   Yes Historical Provider, MD   Physical Exam: Filed Vitals:   04/10/13 0141  BP: 116/87  Pulse:   Temp:   Resp: 12    BP 116/87  Pulse 107  Temp(Src) 97.6 F (36.4 C) (Oral)  Resp 12  SpO2 98%  General Appearance:    Alert, oriented, no distress, appears stated age  Head:    Normocephalic, atraumatic  Eyes:    PERRL, EOMI, sclera non-icteric        Nose:   Nares without drainage or epistaxis. Mucosa, turbinates normal  Throat:   Moist mucous membranes. Oropharynx without erythema or exudate.  Neck:   Supple. No carotid bruits.  No thyromegaly.  No lymphadenopathy.   Back:     No CVA tenderness, no spinal tenderness  Lungs:     Clear to auscultation bilaterally, without wheezes, rhonchi or rales  Chest wall:    No tenderness to palpitation  Heart:    Regular rate and rhythm without murmurs, gallops, rubs  Abdomen:     Soft, non-tender, nondistended, normal bowel  sounds, no organomegaly  Genitalia:    deferred  Rectal:    deferred  Extremities:   No clubbing, cyanosis or edema.  Pulses:   2+ and symmetric all extremities  Skin:   Skin color, texture, turgor normal, no rashes or lesions  Lymph nodes:   Cervical, supraclavicular, and axillary nodes normal  Neurologic:   CNII-XII intact. Normal strength, sensation and reflexes      throughout    Labs on Admission:  Basic Metabolic Panel:  Recent Labs Lab 04/10/13 0014  NA 120*  K 4.3  CL 83*  CO2 22  GLUCOSE 874*  BUN 19  CREATININE 1.03  CALCIUM 8.4   Liver Function Tests:  Recent Labs Lab 04/10/13 0014  AST 14  ALT 21  ALKPHOS 107  BILITOT 0.2*  PROT 7.1  ALBUMIN 3.3*    Recent Labs Lab 04/10/13 0014  LIPASE 13   No results found for this basename: AMMONIA,  in the last 168 hours CBC:  Recent Labs Lab 04/10/13 0014  WBC 4.4  NEUTROABS 3.0  HGB 10.3*  HCT 29.3*  MCV 81.8  PLT 162   Cardiac Enzymes: No results found for this basename: CKTOTAL, CKMB, CKMBINDEX, TROPONINI,  in the last 168 hours  BNP (last 3 results) No results found for this basename: PROBNP,  in the last 8760 hours CBG:  Recent Labs Lab 04/10/13 0207  GLUCAP >600*    Radiological Exams on Admission: No results found.  EKG: Independently reviewed.  Assessment/Plan Principal Problem:   Hyperglycemia without ketosis Active Problems:   Noncompliance with treatment   Diabetes mellitus type 2, uncontrolled, with complications   1. Hyperglycemia without ketosis - surprisingly the patient is not in DKA this time (usually he is), regardless the safest way to treat his profound hyperglycemia of 874 is still via insulin gtt and Q4H labs to monitor electrolyte changes with resolution.  Have ordered these, admitting to med-surg.  Anticipate that we will improve his acute hyperglycemia tomorrow.  Will put in for diabetes coordinator consult but given the chronicity of the recurrent admits, I  fear that there may be little we can do to change his underlying nonadhering to medical therapy at this point.  Case management and SW consults also ordered as per previous recs. 2. Chronic pancreatitis - will go ahead and put patient on ketorolac for this which has worked well for him during previous admits.    Code Status: Full Code  Family Communication: No family in room Disposition Plan: Admit to obs   Time spent: 70 min  GARDNER, JARED M. [facility] Pager [phone]  If 7AM-7PM, please contact the day team taking care of the patient [email or web address] Password [email or web address] 04/10/2013,  2:29 AM

## 2013-04-10 NOTE — Progress Notes (Signed)
   CARE MANAGEMENT NOTE 04/10/2013  Patient:  Gabriel Rush, Gabriel Rush   Account Number:  [id number]  Date Initiated:  04/10/2013  Documentation initiated by:  Lizabeth Leyden  Subjective/Objective Assessment:   admitted with elevated glucose >800  recent admission 03/27/2013 elevated glucose     Action/Plan:   medication assistance, MD follow up   Anticipated DC Date:  04/10/2013   Anticipated DC Plan:  [place]  CM consult  [facility]  Medication Assistance      Choice offered to / List presented to:             Status of service:  Completed, signed off Medicare Important Message given?   (If response is "NO", the following Medicare IM given date fields will be blank) Date Medicare IM given:   Date Additional Medicare IM given:    Discharge Disposition:  HOME/SELF CARE  Per UR Regulation:    If discussed at Long Length of Stay Meetings, dates discussed:    Comments:  04/10/2013  [place], [place] CM referral:  medication assistance, MD follow up  Met with patient regarding medical follow up. Discussed previous medication assistance and he is not eligible at this time. Reviewed the option of going to Wellness clinic for insulin at decreased cost. He states no income to get the medications. Suggested he follow up about the status of his Medicaid application started in October. Discussed the [facility] on [address] for assistance, he states had been there. Suggested he revisit there for options. Asked about medical follow up post discharge. He does not have an MD. Asked if he had gone to the [facility] on 04/03/2013 for his appointment. No he could not make that appointment.  [facility]: left voice mail message requesting follow up appointment for patient.

## 2013-04-11 DIAGNOSIS — K861 Other chronic pancreatitis: Secondary | ICD-10-CM

## 2013-04-11 DIAGNOSIS — D649 Anemia, unspecified: Secondary | ICD-10-CM

## 2013-04-11 LAB — GLUCOSE, CAPILLARY
GLUCOSE-CAPILLARY: 240 mg/dL — AB (ref 70–99)
Glucose-Capillary: 185 mg/dL — ABNORMAL HIGH (ref 70–99)
Glucose-Capillary: 301 mg/dL — ABNORMAL HIGH (ref 70–99)

## 2013-04-11 LAB — BASIC METABOLIC PANEL
BUN: 11 mg/dL (ref 6–23)
BUN: 12 mg/dL (ref 6–23)
BUN: 12 mg/dL (ref 6–23)
BUN: 12 mg/dL (ref 6–23)
BUN: 12 mg/dL (ref 6–23)
CALCIUM: 8.8 mg/dL (ref 8.4–10.5)
CALCIUM: 8.6 mg/dL (ref 8.4–10.5)
CHLORIDE: 101 meq/L (ref 96–112)
CHLORIDE: 102 meq/L (ref 96–112)
CHLORIDE: 96 meq/L (ref 96–112)
CHLORIDE: 99 meq/L (ref 96–112)
CO2: 22 meq/L (ref 19–32)
CO2: 22 meq/L (ref 19–32)
CO2: 23 mEq/L (ref 19–32)
CO2: 23 meq/L (ref 19–32)
CO2: 24 mEq/L (ref 19–32)
CREATININE: 1.08 mg/dL (ref 0.50–1.35)
Calcium: 8.4 mg/dL (ref 8.4–10.5)
Calcium: 8.6 mg/dL (ref 8.4–10.5)
Calcium: 8.3 mg/dL — ABNORMAL LOW (ref 8.4–10.5)
Chloride: 98 mEq/L (ref 96–112)
Creatinine, Ser: 1.08 mg/dL (ref 0.50–1.35)
Creatinine, Ser: 1.09 mg/dL (ref 0.50–1.35)
Creatinine, Ser: 1.05 mg/dL (ref 0.50–1.35)
Creatinine, Ser: 1.02 mg/dL (ref 0.50–1.35)
GFR calc Af Amer: 89 mL/min — ABNORMAL LOW (ref >90)
GFR calc Af Amer: >90 mL/min (ref >90)
GFR calc Af Amer: >90 mL/min (ref >90)
GFR calc Af Amer: >90 mL/min (ref >90)
GFR calc non Af Amer: 77 mL/min — ABNORMAL LOW (ref >90)
GFR calc non Af Amer: 80 mL/min — ABNORMAL LOW (ref >90)
GFR calc non Af Amer: 78 mL/min — ABNORMAL LOW (ref >90)
GFR calc non Af Amer: 83 mL/min — ABNORMAL LOW (ref >90)
GFR, EST NON AFRICAN AMERICAN: 78 mL/min — AB (ref >90)
GLUCOSE: 109 mg/dL — AB (ref 70–99)
GLUCOSE: 222 mg/dL — AB (ref 70–99)
GLUCOSE: 243 mg/dL — AB (ref 70–99)
Glucose, Bld: 75 mg/dL (ref 70–99)
Glucose, Bld: 175 mg/dL — ABNORMAL HIGH (ref 70–99)
Potassium: 3.6 mEq/L — ABNORMAL LOW (ref 3.7–5.3)
Potassium: 3.3 mEq/L — ABNORMAL LOW (ref 3.7–5.3)
Potassium: 4 mEq/L (ref 3.7–5.3)
Potassium: 3.7 mEq/L (ref 3.7–5.3)
Potassium: 5.1 mEq/L (ref 3.7–5.3)
Sodium: 137 mEq/L (ref 137–147)
Sodium: 138 mEq/L (ref 137–147)
Sodium: 135 mEq/L — ABNORMAL LOW (ref 137–147)
Sodium: 132 mEq/L — ABNORMAL LOW (ref 137–147)
Sodium: 132 mEq/L — ABNORMAL LOW (ref 137–147)

## 2013-04-11 MED ORDER — INSULIN ASPART PROT & ASPART (70-30 MIX) 100 UNIT/ML ~~LOC~~ SUSP: 20.0000 [IU] | Freq: Two times a day (BID) | SUBCUTANEOUS | Status: DC

## 2013-04-11 MED ORDER — KETOROLAC TROMETHAMINE 10 MG PO TABS: 10.0000 mg | ORAL_TABLET | Freq: Four times a day (QID) | ORAL | Status: DC | PRN

## 2013-04-11 MED ORDER — GABAPENTIN 300 MG PO CAPS: 900.0000 mg | ORAL_CAPSULE | Freq: Three times a day (TID) | ORAL | Status: DC

## 2013-04-11 MED ORDER — PANCRELIPASE (LIP-PROT-AMYL) 12000-38000 UNITS PO CPEP: 1.0000 | ORAL_CAPSULE | Freq: Three times a day (TID) | ORAL | Status: DC

## 2013-04-11 NOTE — Progress Notes (Signed)
Diabetic education printed out from [facility] regarding signs and symptoms of hypo and hyperglycemia,DKA  How to prevent  And diabetic diet.Buss pass given to patient.

## 2013-04-11 NOTE — Discharge Summary (Signed)
Physician Discharge Summary  [name]:[id number] DOB: 1960/06/26 DOA: 04/10/2013  PCP: No PCP Per Patient  Admit date: 04/10/2013 Discharge date: 04/11/2013  Time spent: 35 minutes  Recommendations for Outpatient Follow-up:  1. Follow up with PCP for further adjustment of insulin. Patient to establish care with wellness center.  2. Need complaint with medications.   Discharge Diagnoses:    Hyperglycemia without ketosis   Noncompliance with treatment   Diabetes mellitus type 2, uncontrolled, with complications   Diabetes neuropathy.    Chronic diarrhea, maybe secondary to pancreatic insufficiency.   Discharge Condition: stable.   Diet recommendation: Carb modified.   Filed Weights   04/10/13 0400  Weight: 73.7 kg (162 lb 7.7 oz)    History of present illness:  Gabriel Rush is a 53 y.o. male who is well known to our service. He states he ran out of his insulin one week ago, and has had polyuria, diarrhea, polydipsia, and worsening diabetic neuropathy since that time. He is frequently admitted for nonadhering to insulin therapy resulting in DKA (about once a month on average).  Unsurprisingly the patients BGL was 872 in the ED, he was not in DKA. Insulin gtt started and hospitalist asked to admit.   Hospital Course:  1-Hyperglycemia, uncontrolled diabetes; no complaint with medications.  He was treated with IV fluids, insulin Gtt.  He was subsequently transition  to 70/30 NPH.  Diabetes, CM, SW consulted.  2-Neuropathy; resume gabapentin. Toradol.  3-Chronic diarrhea; start pancreatic enzymes. Prior c diff negative.    Procedures:  none  Consultations:  none  Discharge Exam: Filed Vitals:   04/11/13 0829  BP: 121/84  Pulse: 86  Temp: 98.4 F (36.9 C)  Resp: 20    General: no distress. Cardiovascular: S 1, S 2 RRR Respiratory: CTA  Discharge Instructions  Discharge Orders   Future Appointments Provider Department Dept Phone   04/19/2013 11:00 AM  [facility] Covering Provider [facility] [phone]   04/19/2013 11:30 AM [facility] Financial Counselor [facility] [phone]   Future Orders Complete By Expires   Diet Carb Modified  As directed    Increase activity slowly  As directed        Medication List         FLUoxetine 40 MG capsule  Commonly known as:  PROZAC  Take 80 mg by mouth 2 (two) times daily.     gabapentin 300 MG capsule  Commonly known as:  NEURONTIN  Take 3 capsules (900 mg total) by mouth 3 (three) times daily.     hydrOXYzine 100 MG capsule  Commonly known as:  VISTARIL  Take 200 mg by mouth 2 (two) times daily.     insulin aspart protamine- aspart (70-30) 100 UNIT/ML injection  Commonly known as:  NOVOLOG MIX 70/30  Inject 0.2 mLs (20 Units total) into the skin 2 (two) times daily with a meal.     ketorolac 10 MG tablet  Commonly known as:  TORADOL  Take 1 tablet (10 mg total) by mouth every 6 (six) hours as needed.     lipase/protease/amylase 84696 UNITS Cpep capsule  Commonly known as:  CREON-12/PANCREASE  Take 1 capsule by mouth 3 (three) times daily with meals.     loperamide 2 MG tablet  Commonly known as:  IMODIUM A-D  Take 4 mg by mouth daily as needed for diarrhea or loose stools.     meloxicam 7.5 MG tablet  Commonly known as:  MOBIC  Take 7.5 mg by mouth every evening.     phenytoin 200 MG ER capsule  Commonly known as:  DILANTIN  Take 200 mg by mouth at bedtime.       Allergies  Allergen Reactions  . Morphine And Related Itching       Follow-up Information   Follow up with [facility]    . (Appointment: Friday 04/19/2013 @ 11:00 am for MD,  11:30 am orange card application)    Contact information:   [address] [place] [address] [phone]       The results of significant diagnostics from this hospitalization (including imaging, microbiology, ancillary and  laboratory) are listed below for reference.    Significant Diagnostic Studies: Ct Abdomen Pelvis W Contrast  03/27/2013   CLINICAL DATA:  Abdominal pain.  Diarrhea.  Chronic pancreatitis.  EXAM: CT ABDOMEN AND PELVIS WITH CONTRAST  TECHNIQUE: Multidetector CT imaging of the abdomen and pelvis was performed using the standard protocol following bolus administration of intravenous contrast.  CONTRAST:  OMNIPAQUE IOHEXOL 300 MG/ML  SOLN  COMPARISON:  05/30/2012  FINDINGS: Pancreatic atrophy and mild ductal dilatation is again demonstrated. There is no evidence of acute peripancreatic inflammatory changes or pancreatic calcifications. No pancreatic mass identified. Previously seen small fluid collection adjacent to the pancreatic body is no longer visualized, consistent with a resolving pseudocyst.  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 No evidence of splenomegaly. The liver and gallbladder are normal in appearance. No evidence of biliary ductal dilatation. Adrenal glands and kidneys are also normal in appearance.  No soft tissue masses or lymphadenopathy identified within the abdomen or pelvis. No evidence of other inflammatory process or abnormal fluid collections. No evidence of bowel wall thickening or dilatation.  IMPRESSION: Resolution of previously seen small peripancreatic pseudocyst. 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 No acute findings.   Electronically Signed   By: Myles Rosenthal M.D.   On: 03/27/2013 11:30    Microbiology: No results found for this or any previous visit (from the past 240 hour(s)).   Labs: Basic Metabolic Panel:  Recent Labs Lab 04/10/13 1632 04/10/13 2057 04/11/13 0051 04/11/13 0531 04/11/13 0840  NA 135* 137 137 138 135*  K 3.6* 3.7 3.6* 3.3* 4.0  CL 99 102 101 102 99  CO2 24 24 23 23 22   GLUCOSE 144* 120*  109* 75 175*  BUN 12 12 12 12 12   CREATININE 0.93 1.04 1.08 1.09 1.05  CALCIUM 8.7 8.6 8.4 8.6 8.8   Liver Function Tests:  Recent Labs Lab 04/10/13 0014  AST 14  ALT 21  ALKPHOS 107  BILITOT 0.2*  PROT 7.1  ALBUMIN 3.3*    Recent Labs Lab 04/10/13 0014  LIPASE 13   No results found for this basename: AMMONIA,  in the last 168 hours CBC:  Recent Labs Lab 04/10/13 0014  WBC 4.4  NEUTROABS 3.0  HGB 10.3*  HCT 29.3*  MCV 81.8  PLT 162   Cardiac Enzymes: No results found for this basename: CKTOTAL, CKMB, CKMBINDEX, TROPONINI,  in the last 168 hours BNP: BNP (last 3 results) No results found for this basename: PROBNP,  in the last 8760 hours CBG:  Recent Labs Lab 04/10/13 0647 04/10/13 0824 04/10/13 1120 04/10/13 1657 04/10/13 2116  GLUCAP 170* 176* 182* 160* 132*       Signed:  Dylann Gallier  [facility] 04/11/2013, 11:22 AM

## 2013-04-15 ENCOUNTER — Emergency Department (HOSPITAL_COMMUNITY)
Admission: EM | Admit: 2013-04-15 | Discharge: 2013-04-16 | Disposition: A | Discharge status: Home / Self Care | Payer: Self-pay | Attending: Emergency Medicine | Admitting: Emergency Medicine

## 2013-04-15 ENCOUNTER — Encounter (HOSPITAL_COMMUNITY): Payer: Self-pay | Admitting: Emergency Medicine

## 2013-04-15 DIAGNOSIS — E1149 Type 2 diabetes mellitus with other diabetic neurological complication: Secondary | ICD-10-CM | POA: Insufficient documentation

## 2013-04-15 DIAGNOSIS — F1020 Alcohol dependence, uncomplicated: Secondary | ICD-10-CM | POA: Insufficient documentation

## 2013-04-15 DIAGNOSIS — G40909 Epilepsy, unspecified, not intractable, without status epilepticus: Secondary | ICD-10-CM | POA: Insufficient documentation

## 2013-04-15 DIAGNOSIS — K529 Noninfective gastroenteritis and colitis, unspecified: Secondary | ICD-10-CM

## 2013-04-15 DIAGNOSIS — R112 Nausea with vomiting, unspecified: Secondary | ICD-10-CM | POA: Insufficient documentation

## 2013-04-15 DIAGNOSIS — F329 Major depressive disorder, single episode, unspecified: Secondary | ICD-10-CM | POA: Insufficient documentation

## 2013-04-15 DIAGNOSIS — R109 Unspecified abdominal pain: Secondary | ICD-10-CM | POA: Insufficient documentation

## 2013-04-15 DIAGNOSIS — Z9119 Patient's noncompliance with other medical treatment and regimen: Secondary | ICD-10-CM | POA: Insufficient documentation

## 2013-04-15 DIAGNOSIS — F3289 Other specified depressive episodes: Secondary | ICD-10-CM | POA: Insufficient documentation

## 2013-04-15 DIAGNOSIS — I10 Essential (primary) hypertension: Secondary | ICD-10-CM | POA: Insufficient documentation

## 2013-04-15 DIAGNOSIS — Z794 Long term (current) use of insulin: Secondary | ICD-10-CM | POA: Insufficient documentation

## 2013-04-15 DIAGNOSIS — Z87891 Personal history of nicotine dependence: Secondary | ICD-10-CM | POA: Insufficient documentation

## 2013-04-15 DIAGNOSIS — Z79899 Other long term (current) drug therapy: Secondary | ICD-10-CM | POA: Insufficient documentation

## 2013-04-15 DIAGNOSIS — M129 Arthropathy, unspecified: Secondary | ICD-10-CM | POA: Insufficient documentation

## 2013-04-15 DIAGNOSIS — G8929 Other chronic pain: Secondary | ICD-10-CM | POA: Insufficient documentation

## 2013-04-15 DIAGNOSIS — E1142 Type 2 diabetes mellitus with diabetic polyneuropathy: Secondary | ICD-10-CM | POA: Insufficient documentation

## 2013-04-15 DIAGNOSIS — Z91199 Patient's noncompliance with other medical treatment and regimen due to unspecified reason: Secondary | ICD-10-CM | POA: Insufficient documentation

## 2013-04-15 DIAGNOSIS — F411 Generalized anxiety disorder: Secondary | ICD-10-CM | POA: Insufficient documentation

## 2013-04-15 DIAGNOSIS — R739 Hyperglycemia, unspecified: Secondary | ICD-10-CM

## 2013-04-15 LAB — CBC WITH DIFFERENTIAL/PLATELET
BASOS ABS: 0 10*3/uL (ref 0.0–0.1)
Basophils Relative: 1 % (ref 0–1)
EOS ABS: 0 10*3/uL (ref 0.0–0.7)
EOS PCT: 1 % (ref 0–5)
HCT: 28.8 % — ABNORMAL LOW (ref 39.0–52.0)
Hemoglobin: 10.3 g/dL — ABNORMAL LOW (ref 13.0–17.0)
Lymphocytes Relative: 27 % (ref 12–46)
Lymphs Abs: 1.3 10*3/uL (ref 0.7–4.0)
MCH: 27.5 pg (ref 26.0–34.0)
MCHC: 35.8 g/dL (ref 30.0–36.0)
MCV: 77 fL — ABNORMAL LOW (ref 78.0–100.0)
MONO ABS: 0.5 10*3/uL (ref 0.1–1.0)
Monocytes Relative: 10 % (ref 3–12)
NEUTROS PCT: 61 % (ref 43–77)
Neutro Abs: 2.9 10*3/uL (ref 1.7–7.7)
PLATELETS: 149 10*3/uL — AB (ref 150–400)
RBC: 3.74 MIL/uL — AB (ref 4.22–5.81)
WBC: 4.7 10*3/uL (ref 4.0–10.5)

## 2013-04-15 LAB — COMPREHENSIVE METABOLIC PANEL
ALBUMIN: 3.4 g/dL — AB (ref 3.5–5.2)
ALT: 35 U/L (ref 0–53)
AST: 22 U/L (ref 0–37)
Alkaline Phosphatase: 116 U/L (ref 39–117)
BUN: 14 mg/dL (ref 6–23)
CALCIUM: 9 mg/dL (ref 8.4–10.5)
CHLORIDE: 86 meq/L — AB (ref 96–112)
CO2: 21 mEq/L (ref 19–32)
Creatinine, Ser: 1.05 mg/dL (ref 0.50–1.35)
GFR calc Af Amer: >90 mL/min (ref >90)
GFR calc non Af Amer: 80 mL/min — ABNORMAL LOW (ref >90)
Glucose, Bld: 940 mg/dL (ref 70–99)
Potassium: 4.9 mEq/L (ref 3.7–5.3)
Sodium: 124 mEq/L — ABNORMAL LOW (ref 137–147)
TOTAL PROTEIN: 7.7 g/dL (ref 6.0–8.3)
Total Bilirubin: <0.2 mg/dL — ABNORMAL LOW (ref 0.3–1.2)

## 2013-04-15 LAB — URINE MICROSCOPIC-ADD ON

## 2013-04-15 LAB — URINALYSIS, ROUTINE W REFLEX MICROSCOPIC
Bilirubin Urine: NEGATIVE
HGB URINE DIPSTICK: NEGATIVE
Ketones, ur: NEGATIVE mg/dL
Leukocytes, UA: NEGATIVE
Nitrite: NEGATIVE
PH: 5.5 (ref 5.0–8.0)
Protein, ur: NEGATIVE mg/dL
SPECIFIC GRAVITY, URINE: 1.029 (ref 1.005–1.030)
Urobilinogen, UA: 0.2 mg/dL (ref 0.0–1.0)

## 2013-04-15 LAB — BASIC METABOLIC PANEL
BUN: 10 mg/dL (ref 6–23)
CALCIUM: 8.4 mg/dL (ref 8.4–10.5)
CO2: 23 meq/L (ref 19–32)
Chloride: 103 mEq/L (ref 96–112)
Creatinine, Ser: 0.8 mg/dL (ref 0.50–1.35)
GFR calc Af Amer: >90 mL/min (ref >90)
Glucose, Bld: 168 mg/dL — ABNORMAL HIGH (ref 70–99)
Potassium: 3.2 mEq/L — ABNORMAL LOW (ref 3.7–5.3)
SODIUM: 139 meq/L (ref 137–147)

## 2013-04-15 LAB — CBG MONITORING, ED
GLUCOSE-CAPILLARY: 371 mg/dL — AB (ref 70–99)
GLUCOSE-CAPILLARY: 258 mg/dL — AB (ref 70–99)
Glucose-Capillary: 599 mg/dL (ref 70–99)
Glucose-Capillary: 433 mg/dL — ABNORMAL HIGH (ref 70–99)
Glucose-Capillary: 314 mg/dL — ABNORMAL HIGH (ref 70–99)
Glucose-Capillary: 157 mg/dL — ABNORMAL HIGH (ref 70–99)

## 2013-04-15 LAB — LIPASE, BLOOD: LIPASE: 18 U/L (ref 11–59)

## 2013-04-15 LAB — KETONES, QUALITATIVE: ACETONE BLD: NEGATIVE

## 2013-04-15 MED ORDER — INSULIN ASPART 100 UNIT/ML ~~LOC~~ SOLN
10.0000 [IU] | Freq: Once | SUBCUTANEOUS | Status: AC
  Administered 2013-04-15: 10 [IU] via INTRAVENOUS
  Filled 2013-04-15: qty 1

## 2013-04-15 MED ORDER — DEXTROSE-NACL 5-0.45 % IV SOLN: INTRAVENOUS | Status: DC

## 2013-04-15 MED ORDER — SODIUM CHLORIDE 0.9 % IV SOLN
1000.0000 mL | Freq: Once | INTRAVENOUS | Status: AC
  Administered 2013-04-15: 1000 mL via INTRAVENOUS

## 2013-04-15 MED ORDER — KETOROLAC TROMETHAMINE 15 MG/ML IJ SOLN
30.0000 mg | Freq: Once | INTRAMUSCULAR | Status: AC
  Administered 2013-04-15: 30 mg via INTRAVENOUS
  Filled 2013-04-15: qty 2

## 2013-04-15 MED ORDER — ONDANSETRON HCL 4 MG/2ML IJ SOLN
4.0000 mg | Freq: Once | INTRAMUSCULAR | Status: AC
  Administered 2013-04-15: 4 mg via INTRAVENOUS
  Filled 2013-04-15: qty 2

## 2013-04-15 MED ORDER — SODIUM CHLORIDE 0.9 % IV SOLN
INTRAVENOUS | Status: DC
  Administered 2013-04-15: 5.4 [IU]/h via INTRAVENOUS
  Filled 2013-04-15: qty 1

## 2013-04-15 MED ORDER — SODIUM CHLORIDE 0.9 % IV SOLN
1000.0000 mL | INTRAVENOUS | Status: DC
  Administered 2013-04-15: 1000 mL via INTRAVENOUS

## 2013-04-15 MED ORDER — POTASSIUM CHLORIDE CRYS ER 20 MEQ PO TBCR
40.0000 meq | EXTENDED_RELEASE_TABLET | Freq: Once | ORAL | Status: AC
  Administered 2013-04-15: 40 meq via ORAL
  Filled 2013-04-15: qty 2

## 2013-04-15 MED ORDER — SODIUM CHLORIDE 0.9 % IV BOLUS (SEPSIS)
1000.0000 mL | Freq: Once | INTRAVENOUS | Status: AC
  Administered 2013-04-15: 1000 mL via INTRAVENOUS

## 2013-04-15 NOTE — Progress Notes (Signed)
[facility] CL [facility] with [facility] [employer] [facility]. Patient stated that [facility] enrollment in program. Patient stated that he could not remember what time the apt is. CL tried to [facility] to get patient apt information but was unable to at this time. Provided him with [facility] contact information, so patient could call about apt.

## 2013-04-15 NOTE — ED Notes (Signed)
Pt alert, arrives from home, c/o diarrhea, onset was several weeks ago, chronic per patient, resp evn unlabored, skin pwd, denies recent travel

## 2013-04-15 NOTE — ED Notes (Signed)
Initial Contact - pt to [facility] with c/o diarrhea "for months", pt reports unable to see his PCP "don't have insurance".  Pt a+ox4, skin pwd, ambulating with steady gait.  Speaking full/clear sentences, rr even/un-lab.  +bsx4 quads.  Abd s/nt/nd.  Pt denies fevers/chills, nausea, or other complaints.  NAD.  Awaiting EDP eval.

## 2013-04-15 NOTE — ED Notes (Signed)
Pt stated he needed to have bowel movement, before went to bathroom had diarrhea on self, walked to bathroom w/o difficulty to clean self up.

## 2013-04-15 NOTE — ED Provider Notes (Signed)
CSN: [id number]     Arrival date & time 04/15/13  1406 History   First MD Initiated Contact with Patient 04/15/13 1542     Chief Complaint  Patient presents with  . Diarrhea     (Consider location/radiation/quality/duration/timing/severity/associated sxs/prior Treatment) HPI Comments: Patient is a 53 year old male past medical history significant for hypertension, pancreatitis, alcohol abuse, chronic abdominal pain, diabetic neuropathy, hypercholesterolemia, DM, depression, seizures, headache, arthritis, anxiety, polysubstance abuse, medication non-compliance presented to the emergency department for diarrhea. Patient states that he has had diarrhea for several months, since he has had this issue since being in the hospital back in February, upon chart review patient has even further long standing diarrhea. Patient also states he has continued upper abdominal pain which is chronic for patient. He states he has had one to two episodes of non-bloody emesis associated with diarrhea. Patient had a C. Diff performed while he was hospitalized on 2/17 which was negative at that time. Patient states he has not taken any of his medications for five days as he has run out of them.   Patient is a 53 y.o. male presenting with diarrhea.  Diarrhea Associated symptoms: abdominal pain and vomiting   Associated symptoms: no fever     Past Medical History  Diagnosis Date  . Hypertension   . Pancreatitis   . Alcohol abuse   . Chronic abdominal pain   . Alcoholism /alcohol abuse   . Neuromuscular disorder 07/02/2012    Diabetic neuropathy  . Hypercholesteremia   . Type II diabetes mellitus   . Depression   . Diabetic peripheral neuropathy   . Grand mal     "maybe once/month; had one for a whole week just prior to coming here; black out so many times" (12/04/2012)  . Headache(784.0)     "weekly; can get severe; never dx'd w/migraines" (12/04/2012)  . Arthritis     "hands & arms" (12/04/2012)  . Anxiety     Past Surgical History  Procedure Laterality Date  . Appendectomy  1970's   Family History  Problem Relation Age of Onset  . Hypertension    . Hypertension Mother    History  Substance Use Topics  . Smoking status: Former Smoker -- 0.50 packs/day for 8 years    Types: Cigarettes    Quit date: 12/21/1984  . Smokeless tobacco: Never Used  . Alcohol Use: 3.6 oz/week    6 Cans of beer per week     Comment: 10/28//2014 "30 day inpatient [facility] 07/2012; h/o heavy, binge drinking; last drink was 1 week ago"    Review of Systems  Constitutional: Negative for fever.  Gastrointestinal: Positive for nausea, vomiting, abdominal pain and diarrhea. Negative for blood in stool and anal bleeding.  All other systems reviewed and are negative.      Allergies  Morphine and related  Home Medications   Current Outpatient Rx  Name  Route  Sig  Dispense  Refill  . FLUoxetine (PROZAC) 40 MG capsule   Oral   Take 80 mg by mouth 2 (two) times daily.         Marland Kitchen gabapentin (NEURONTIN) 300 MG capsule   Oral   Take 3 capsules (900 mg total) by mouth 3 (three) times daily.   60 capsule   0   . hydrOXYzine (VISTARIL) 100 MG capsule   Oral   Take 200 mg by mouth 2 (two) times daily.         Marland Kitchen ketorolac (TORADOL) 10 MG tablet  Oral   Take 10 mg by mouth every 6 (six) hours as needed (pain.).         Marland Kitchen lipase/protease/amylase (CREON-12/PANCREASE) 12000 UNITS CPEP capsule   Oral   Take 1 capsule by mouth 3 (three) times daily with meals.         Marland Kitchen loperamide (IMODIUM A-D) 2 MG tablet   Oral   Take 4 mg by mouth daily as needed for diarrhea or loose stools.         . meloxicam (MOBIC) 7.5 MG tablet   Oral   Take 7.5 mg by mouth every evening.          . phenytoin (DILANTIN) 200 MG ER capsule   Oral   Take 200 mg by mouth at bedtime.         . traMADol (ULTRAM) 50 MG tablet   Oral   Take 100 mg by mouth 3 (three) times daily as needed (pain.).         Marland Kitchen insulin  aspart protamine- aspart (NOVOLOG MIX 70/30) (70-30) 100 UNIT/ML injection   Subcutaneous   Inject 0.2 mLs (20 Units total) into the skin 2 (two) times daily with a meal.   10 mL   11   . loperamide (IMODIUM) 2 MG capsule   Oral   Take 1 capsule (2 mg total) by mouth 4 (four) times daily as needed for diarrhea or loose stools.   12 capsule   0    BP 127/81  Pulse 90  Temp(Src) 97.7 F (36.5 C) (Oral)  Resp 18  Wt 162 lb (73.483 kg)  SpO2 98% Physical Exam  Nursing note and vitals reviewed. Constitutional: He is oriented to person, place, and time. Vital signs are normal. He appears well-developed and well-nourished. He is active and cooperative.  Non-toxic appearance. He does not have a sickly appearance. He does not appear ill. No distress.  HENT:  Head: Normocephalic and atraumatic.  Right Ear: External ear normal.  Left Ear: External ear normal.  Nose: Nose normal.  Mouth/Throat: Uvula is midline, oropharynx is clear and moist and mucous membranes are normal. Mucous membranes are not pale, not dry and not cyanotic.  Eyes: Conjunctivae are normal.  Neck: Normal range of motion. Neck supple.  Cardiovascular: Normal rate, regular rhythm and normal heart sounds.   Pulmonary/Chest: Effort normal and breath sounds normal. No respiratory distress.  Abdominal: Soft. Bowel sounds are normal. He exhibits no distension. There is tenderness in the epigastric area and periumbilical area. There is no rigidity, no rebound and no guarding.  Musculoskeletal: Normal range of motion. He exhibits no edema.  Neurological: He is alert and oriented to person, place, and time. Gait normal. GCS eye subscore is 4. GCS verbal subscore is 5. GCS motor subscore is 6.  Skin: Skin is warm and dry. He is not diaphoretic.    ED Course  Procedures (including critical care time) Medications  0.9 %  sodium chloride infusion (0 mLs Intravenous Stopped 04/15/13 1843)    Followed by  0.9 %  sodium chloride  infusion (0 mLs Intravenous Stopped 04/15/13 1842)    Followed by  0.9 %  sodium chloride infusion (0 mLs Intravenous Stopped 04/15/13 2308)  dextrose 5 %-0.45 % sodium chloride infusion (not administered)  insulin regular (NOVOLIN R,HUMULIN R) 1 Units/mL in sodium chloride 0.9 % 100 mL infusion (0 Units/hr Intravenous Stopped 04/15/13 2308)  sodium chloride 0.9 % bolus 1,000 mL (0 mLs Intravenous Stopped 04/15/13 1730)  ondansetron (  ZOFRAN) injection 4 mg (4 mg Intravenous Given 04/15/13 1634)  insulin aspart (novoLOG) injection 10 Units (10 Units Intravenous Given 04/15/13 1642)  ketorolac (TORADOL) 15 MG/ML injection 30 mg (30 mg Intravenous Given 04/15/13 2051)  potassium chloride SA (K-DUR,KLOR-CON) CR tablet 40 mEq (40 mEq Oral Given 04/15/13 2355)    Labs Review Labs Reviewed  CBC WITH DIFFERENTIAL - Abnormal; Notable for the following:    RBC 3.74 (*)    Hemoglobin 10.3 (*)    HCT 28.8 (*)    MCV 77.0 (*)    Platelets 149 (*)    All other components within normal limits  COMPREHENSIVE METABOLIC PANEL - Abnormal; Notable for the following:    Sodium 124 (*)    Chloride 86 (*)    Glucose, Bld 940 (*)    Albumin 3.4 (*)    Total Bilirubin <0.2 (*)    GFR calc non Af Amer 80 (*)    All other components within normal limits  URINALYSIS, ROUTINE W REFLEX MICROSCOPIC - Abnormal; Notable for the following:    Glucose, UA >1000 (*)    All other components within normal limits  BASIC METABOLIC PANEL - Abnormal; Notable for the following:    Potassium 3.2 (*)    Glucose, Bld 168 (*)    All other components within normal limits  CBG MONITORING, ED - Abnormal; Notable for the following:    Glucose-Capillary 599 (*)    All other components within normal limits  CBG MONITORING, ED - Abnormal; Notable for the following:    Glucose-Capillary 433 (*)    All other components within normal limits  CBG MONITORING, ED - Abnormal; Notable for the following:    Glucose-Capillary 371 (*)    All other  components within normal limits  CBG MONITORING, ED - Abnormal; Notable for the following:    Glucose-Capillary 314 (*)    All other components within normal limits  CBG MONITORING, ED - Abnormal; Notable for the following:    Glucose-Capillary 258 (*)    All other components within normal limits  CBG MONITORING, ED - Abnormal; Notable for the following:    Glucose-Capillary 157 (*)    All other components within normal limits  URINE MICROSCOPIC-ADD ON  LIPASE, BLOOD  KETONES, QUALITATIVE  CBG MONITORING, ED   Imaging Review No results found.   EKG Interpretation None      MDM   Final diagnoses:  Hyperglycemia without ketosis  Chronic diarrhea    Filed Vitals:   04/15/13 2156  BP:   Pulse:   Temp: 97.7 F (36.5 C)  Resp:    Afebrile, NAD, non-toxic appearing, AAOx4.   AG 17. No ketones present in urine or blood.   Abdomen soft, epigastric tenderness appreciated (chronic for patient), no distention, no guarding, rebound, or rigidity. Mucous membranes are moist. No clinical evidence of dehydration. IVF, glucostabilizer started. CBGs monitored throughout ED stay. CBG trended down. Labs reviewed. Lipase wnl. BMP rechecked once glucose below 300. Mild hypokalemia, replaced in ED. AG closed (13). Patient eating and drinking without difficulty in ED. Patient has had recent C. Diff test that was negative without new antibiotic use, will not repeat C. Diff at this time, diarrhea is also chronic for patient. Will discharge patient home with advised PCP follow up. Also discussed importance of taking his medications. Return precautions discussed. Patient is agreeable to plan. Patient is stable at time of discharge. Patient d/w with Dr. Radford Pax, agrees with plan.  Jeannetta EllisJennifer L Thatiana Renbarger, PA-C 04/16/13 0024

## 2013-04-15 NOTE — ED Notes (Signed)
EDPA notified of elevated blood glucose

## 2013-04-15 NOTE — ED Notes (Signed)
940 glucose reported by lab.

## 2013-04-16 LAB — CBG MONITORING, ED: GLUCOSE-CAPILLARY: 96 mg/dL (ref 70–99)

## 2013-04-16 MED ORDER — LOPERAMIDE HCL 2 MG PO CAPS: 2.0000 mg | ORAL_CAPSULE | Freq: Four times a day (QID) | ORAL | Status: DC | PRN

## 2013-04-16 MED ORDER — INSULIN ASPART PROT & ASPART (70-30 MIX) 100 UNIT/ML ~~LOC~~ SUSP: 20.0000 [IU] | Freq: Two times a day (BID) | SUBCUTANEOUS | Status: DC

## 2013-04-16 NOTE — ED Notes (Signed)
Pt sitting up in bed eating sandwich and drinking ginger ale, in no distress.

## 2013-04-16 NOTE — ED Provider Notes (Signed)
Medical screening examination/treatment/procedure(s) were performed by non-physician practitioner and as supervising physician I was immediately available for consultation/collaboration.   Dason Mosley L Kinzlee Selvy, MD 04/16/13 1251 

## 2013-04-16 NOTE — Discharge Instructions (Signed)
Please follow up with your primary care physician in 1-2 days. If you do not have one please call the [facility] number listed above. Please take your at home medications as prescribed. Please read all discharge instructions and return precautions.    Chronic Diarrhea Diarrhea is frequent loose and watery bowel movements. It can cause you to feel weak and dehydrated. Dehydration can cause you to become tired and thirsty and to have a dry mouth, decreased urination, and dark yellow urine. Diarrhea is a sign of another problem, most often an infection that will not last long. In most cases, diarrhea lasts 2 3 days. Diarrhea that lasts longer than 4 weeks is called long-lasting (chronic) diarrhea. It is important to treat your diarrhea as directed by your health care provider to lessen or prevent future episodes of diarrhea.  CAUSES  There are many causes of chronic diarrhea. The following are some possible causes:   Gastrointestinal infections caused by viruses, bacteria, or parasites.   Food poisoning or food allergies.   Certain medicines, such as antibiotics, chemotherapy, and laxatives.   Artificial sweeteners and fructose.   Digestive disorders, such as celiac disease and inflammatory bowel diseases.   Irritable bowel syndrome.  Some disorders of the pancreas.  Disorders of the thyroid.  Reduced blood flow to the intestines.  Cancer. Sometimes the cause of chronic diarrhea is unknown. RISK FACTORS  Having a severely weakened immune system, such as from HIV or AIDS.   Taking certain types of cancer-fighting drugs (such as with chemotherapy) or other medicines.   Having had a recent organ transplant.   Having a portion of the stomach or small bowel removed.   Traveling to countries where food and water supplies are often contaminated.  SYMPTOMS  In addition to frequent, loose stools, diarrhea may cause:   Cramping.   Abdominal pain.    Nausea.   Fever.  Fatigue.  Urgent need to use the bathroom.  Loss of bowel control. DIAGNOSIS  Your health care provider must take a careful history and perform a physical exam. Tests given are based on your symptoms and history. Tests may include:   Blood or stool tests. Three or more stool samples may be examined. Stool cultures may be used to test for bacteria or parasites.   X-rays.   A procedure in which a thin tube is inserted into the mouth or rectum (endoscopy). This allows the health care provider to look inside the intestine.  TREATMENT   Treatment is aimed at correcting the cause of the diarrhea when possible.  Diarrhea caused by an infection can often be treated with antibiotics.   Diarrhea not caused by an infection may require you to take long-term medicine or have surgery. Specific treatment should be discussed with your health care provider.   If the cause cannot be determined, treatment aims to relieve symptoms and prevent dehydration. Serious health problems can occur if you do not maintain proper fluid levels. Treatment may include:   Taking an oral rehydration solution (ORS) .  Not drinking beverages that contain caffeine (such as tea, coffee, and soft drinks).   Not drinking alcohol.   Maintaining well-balanced nutrition to help you recover faster. HOME CARE INSTRUCTIONS   Drink enough fluids to keep urine clear or pale yellow. Drink 1 cup (8 oz) of fluid for each diarrhea episode. Avoid fluids that contain simple sugars, fruit juices, whole milk products, and sodas. Hydrate with an ORS. You may purchase the ORS  or prepare it at home by mixing the following ingredients together:     tsp (1.7 3  mL) table salt.    tsp (3  mL) baking soda.    tsp (1.7 mL) salt substitute containing potassium chloride.   1 tbsp (20 mL) sugar.   4.2 c (1 L) of water.   Certain foods and beverages may increase the speed at which food moves through  the gastrointestinal (GI) tract. These foods and beverages should be avoided. They include:   Caffeinated and alcoholic beverages.   High-fiber foods, such as raw fruits and vegetables, nuts, seeds, and whole grain breads and cereals.   Foods and beverages sweetened with sugar alcohols, such as xylitol, sorbitol, and mannitol.   Some foods may be well tolerated and may help thicken stool. These include:  Starchy foods, such as rice, toast, pasta, low-sugar cereal, oatmeal, grits, baked potatoes, crackers, and bagels.   Bananas.   Applesauce.   Add probiotic-rich foods to help increase healthy bacteria in the GI tract. These include yogurt and fermented milk products.   Wash your hands well after each diarrhea episode.   Only take over-the-counter or prescription medicines as directed by your health care provider.   Take a warm bath to relieve any burning or pain from frequent diarrhea episodes. SEEK MEDICAL CARE IF:   You are not urinating as often.  Your urine is a dark color.   You become very tired or dizzy.   You have severe pain in the abdomen or rectum.   Your have blood or pus in your stools.   Your stools look black and tarry.  SEEK IMMEDIATE MEDICAL CARE IF:   You are unable to keep fluids down.   You have persistent vomiting.   You have blood in your stool.  Your stools are black and tarry.   You do not urinate in 6 8 hours, or there is only a small amount of very dark urine.   You have abdominal pain that increases or localizes.   You have weakness, dizziness, confusion, or lightheadedness.   You have a severe headache.   Your diarrhea gets worse or does not get better.   You have a fever or persistent symptoms for more than 2 3 days.   You have a fever and your symptoms suddenly get worse.  MAKE SURE YOU:   Understand these instructions.  Will watch your condition.  Will get help right away if you are not doing well  or get worse. Document Released: 04/16/2003 Document Revised: 09/26/2012 Document Reviewed: 07/19/2012 [facility] Patient Information 2014 [place], [place].  Hyperglycemia Hyperglycemia occurs when the glucose (sugar) in your blood is too high. Hyperglycemia can happen for many reasons, but it most often happens to people who do not know they have diabetes or are not managing their diabetes properly.  CAUSES  Whether you have diabetes or not, there are other causes of hyperglycemia. Hyperglycemia can occur when you have diabetes, but it can also occur in other situations that you might not be as aware of, such as: Diabetes  If you have diabetes and are having problems controlling your blood glucose, hyperglycemia could occur because of some of the following reasons:  Not following your meal plan.  Not taking your diabetes medications or not taking it properly.  Exercising less or doing less activity than you normally do.  Being sick. Pre-diabetes  This cannot be ignored. Before people develop Type 2 diabetes, they almost always have "pre-diabetes."  This is when your blood glucose levels are higher than normal, but not yet high enough to be diagnosed as diabetes. Research has shown that some long-term damage to the body, especially the heart and circulatory system, may already be occurring during pre-diabetes. If you take action to manage your blood glucose when you have pre-diabetes, you may delay or prevent Type 2 diabetes from developing. Stress  If you have diabetes, you may be "diet" controlled or on oral medications or insulin to control your diabetes. However, you may find that your blood glucose is higher than usual in the hospital whether you have diabetes or not. This is often referred to as "stress hyperglycemia." Stress can elevate your blood glucose. This happens because of hormones put out by the body during times of stress. If stress has been the cause of your high blood glucose,  it can be followed regularly by your caregiver. That way he/she can make sure your hyperglycemia does not continue to get worse or progress to diabetes. Steroids  Steroids are medications that act on the infection fighting system (immune system) to block inflammation or infection. One side effect can be a rise in blood glucose. Most people can produce enough extra insulin to allow for this rise, but for those who cannot, steroids make blood glucose levels go even higher. It is not unusual for steroid treatments to "uncover" diabetes that is developing. It is not always possible to determine if the hyperglycemia will go away after the steroids are stopped. A special blood test called an A1c is sometimes done to determine if your blood glucose was elevated before the steroids were started. SYMPTOMS  Thirsty.  Frequent urination.  Dry mouth.  Blurred vision.  Tired or fatigue.  Weakness.  Sleepy.  Tingling in feet or leg. DIAGNOSIS  Diagnosis is made by monitoring blood glucose in one or all of the following ways:  A1c test. This is a chemical found in your blood.  Fingerstick blood glucose monitoring.  Laboratory results. TREATMENT  First, knowing the cause of the hyperglycemia is important before the hyperglycemia can be treated. Treatment may include, but is not be limited to:  Education.  Change or adjustment in medications.  Change or adjustment in meal plan.  Treatment for an illness, infection, etc.  More frequent blood glucose monitoring.  Change in exercise plan.  Decreasing or stopping steroids.  Lifestyle changes. HOME CARE INSTRUCTIONS   Test your blood glucose as directed.  Exercise regularly. Your caregiver will give you instructions about exercise. Pre-diabetes or diabetes which comes on with stress is helped by exercising.  Eat wholesome, balanced meals. Eat often and at regular, fixed times. Your caregiver or nutritionist will give you a meal plan to  guide your sugar intake.  Being at an ideal weight is important. If needed, losing as little as 10 to 15 pounds may help improve blood glucose levels. SEEK MEDICAL CARE IF:   You have questions about medicine, activity, or diet.  You continue to have symptoms (problems such as increased thirst, urination, or weight gain). SEEK IMMEDIATE MEDICAL CARE IF:   You are vomiting or have diarrhea.  Your breath smells fruity.  You are breathing faster or slower.  You are very sleepy or incoherent.  You have numbness, tingling, or pain in your feet or hands.  You have chest pain.  Your symptoms get worse even though you have been following your caregiver's orders.  If you have any other questions or concerns. Document Released:  07/20/2000 Document Revised: 04/18/2011 Document Reviewed: 05/23/2011 [employer] Patient Information 2014 [place], [place].  Establish relationship with primary care doctor as discussed. A resource guide and information on the Affordable Care Act has been provided for your information.    RESOURCE GUIDE  If you do not have a primary care doctor to follow up with regarding today's visit, please call the [facility] at [phone] to make an appointment. Hours of operation are 10am - 7pm, Monday through Friday, and they have a sliding scale fee.   Insufficient Money for Medicine: Contact [employer]:  call "211" or [facility] [phone].  No Primary Care Doctor: - Call [facility]  [phone] - can help you locate a primary care doctor that  accepts your insurance, provides certain services, etc. - Physician Referral Service- [phone]  Agencies that provide inexpensive medical care: - [facility]  [phone] - [facility]  [phone] - [facility]  [phone] - [facility]  [phone] - [facility]  [phone] [facility]  [phone]  Medicaid-accepting [facility] Providers: - [facility]- [address]  [phone], Mon-Fri 9am-7pm, Sat 9am-1pm - [facility]- [address] [place], [address] [place]  [phone] - [facility]- [address] [place]  [phone] [facility]- [address]  [phone] - Renaye Rakers- [address] [place], [address], [phone]  Only accepts [place] patients after they have their name  applied to their card  Self Pay (no insurance) in [place]: - Sickle Cell Patients: Dr Willey Blade, [facility]  [address] [place], [phone] - [facility]- [address] [place]  [phone]       [facility] [place]- [address]       -     [facility]- see information above (Speak to [employer] if you do not have insurance)       -  [facility]- [address] [place], [phone]       -  [facility]- [address],  [phone]       -  [facility]- [address] [phone]       -  Dr Julio Sicks-  [address], [place], [phone]       -  [facility]- [address] [phone]       -  [facility]- [address] [phone], also [address], [phone]       -    [facility]- [address] [place], [phone], 1st & 3rd Saturday   every month, 10am-1pm  1) Find a Doctor and Pay Out of Pocket Although you won't have to find out who is covered by your insurance plan, it is a good idea to ask around and get recommendations. You will then need to call the office and see if the doctor you have chosen will accept you as a new patient and what types of options they offer for patients who are self-pay. Some doctors offer discounts or will set up payment plans for their patients who do not have insurance, but you will need to ask so you aren't surprised when you get to your  appointment.  2)  Contact Your Local Health Department Not all health departments have doctors that can see patients for sick visits, but many do, so it is worth a call to see if yours does. If you don't know where your local health department is, you can check in your phone book. The CDC also has a tool to help you locate your state's health department, and many state websites also have listings of all of their local health departments.  3) Find a Walk-in Clinic If your illness is not likely to be very severe or complicated, you may want to try a walk in clinic. These are popping up all over the country in pharmacies, drugstores, and shopping centers. They're usually staffed by nurse practitioners or physician assistants that have been trained to treat common illnesses and complaints. They're usually fairly quick and inexpensive. However, if you have serious medical issues or chronic medical problems, these are probably not your best option

## 2013-04-19 ENCOUNTER — Inpatient Hospital Stay: Payer: Self-pay

## 2013-04-19 ENCOUNTER — Ambulatory Visit: Payer: Self-pay

## 2013-04-30 ENCOUNTER — Encounter (HOSPITAL_COMMUNITY): Payer: Self-pay | Admitting: Emergency Medicine

## 2013-04-30 ENCOUNTER — Emergency Department (HOSPITAL_COMMUNITY)
Admission: EM | Admit: 2013-04-30 | Discharge: 2013-05-01 | Disposition: A | Discharge status: Home / Self Care | Payer: Self-pay | Attending: Emergency Medicine | Admitting: Emergency Medicine

## 2013-04-30 DIAGNOSIS — I10 Essential (primary) hypertension: Secondary | ICD-10-CM | POA: Insufficient documentation

## 2013-04-30 DIAGNOSIS — Z794 Long term (current) use of insulin: Secondary | ICD-10-CM | POA: Insufficient documentation

## 2013-04-30 DIAGNOSIS — M19039 Primary osteoarthritis, unspecified wrist: Secondary | ICD-10-CM | POA: Insufficient documentation

## 2013-04-30 DIAGNOSIS — F1220 Cannabis dependence, uncomplicated: Secondary | ICD-10-CM

## 2013-04-30 DIAGNOSIS — R112 Nausea with vomiting, unspecified: Secondary | ICD-10-CM | POA: Insufficient documentation

## 2013-04-30 DIAGNOSIS — F1020 Alcohol dependence, uncomplicated: Secondary | ICD-10-CM

## 2013-04-30 DIAGNOSIS — D649 Anemia, unspecified: Secondary | ICD-10-CM

## 2013-04-30 DIAGNOSIS — F329 Major depressive disorder, single episode, unspecified: Secondary | ICD-10-CM | POA: Insufficient documentation

## 2013-04-30 DIAGNOSIS — F1920 Other psychoactive substance dependence, uncomplicated: Secondary | ICD-10-CM

## 2013-04-30 DIAGNOSIS — IMO0002 Reserved for concepts with insufficient information to code with codable children: Secondary | ICD-10-CM

## 2013-04-30 DIAGNOSIS — E1142 Type 2 diabetes mellitus with diabetic polyneuropathy: Secondary | ICD-10-CM | POA: Insufficient documentation

## 2013-04-30 DIAGNOSIS — G8929 Other chronic pain: Secondary | ICD-10-CM | POA: Insufficient documentation

## 2013-04-30 DIAGNOSIS — Z79899 Other long term (current) drug therapy: Secondary | ICD-10-CM | POA: Insufficient documentation

## 2013-04-30 DIAGNOSIS — F1994 Other psychoactive substance use, unspecified with psychoactive substance-induced mood disorder: Secondary | ICD-10-CM

## 2013-04-30 DIAGNOSIS — F1010 Alcohol abuse, uncomplicated: Secondary | ICD-10-CM

## 2013-04-30 DIAGNOSIS — F411 Generalized anxiety disorder: Secondary | ICD-10-CM | POA: Insufficient documentation

## 2013-04-30 DIAGNOSIS — R739 Hyperglycemia, unspecified: Secondary | ICD-10-CM

## 2013-04-30 DIAGNOSIS — E1165 Type 2 diabetes mellitus with hyperglycemia: Secondary | ICD-10-CM

## 2013-04-30 DIAGNOSIS — E785 Hyperlipidemia, unspecified: Secondary | ICD-10-CM

## 2013-04-30 DIAGNOSIS — R197 Diarrhea, unspecified: Secondary | ICD-10-CM

## 2013-04-30 DIAGNOSIS — R109 Unspecified abdominal pain: Secondary | ICD-10-CM | POA: Insufficient documentation

## 2013-04-30 DIAGNOSIS — K861 Other chronic pancreatitis: Secondary | ICD-10-CM

## 2013-04-30 DIAGNOSIS — E119 Type 2 diabetes mellitus without complications: Secondary | ICD-10-CM | POA: Insufficient documentation

## 2013-04-30 DIAGNOSIS — E860 Dehydration: Secondary | ICD-10-CM | POA: Insufficient documentation

## 2013-04-30 DIAGNOSIS — E1110 Type 2 diabetes mellitus with ketoacidosis without coma: Secondary | ICD-10-CM

## 2013-04-30 DIAGNOSIS — Z791 Long term (current) use of non-steroidal anti-inflammatories (NSAID): Secondary | ICD-10-CM | POA: Insufficient documentation

## 2013-04-30 DIAGNOSIS — E43 Unspecified severe protein-calorie malnutrition: Secondary | ICD-10-CM

## 2013-04-30 DIAGNOSIS — G40309 Generalized idiopathic epilepsy and epileptic syndromes, not intractable, without status epilepticus: Secondary | ICD-10-CM | POA: Insufficient documentation

## 2013-04-30 DIAGNOSIS — F3289 Other specified depressive episodes: Secondary | ICD-10-CM | POA: Insufficient documentation

## 2013-04-30 DIAGNOSIS — F319 Bipolar disorder, unspecified: Secondary | ICD-10-CM

## 2013-04-30 DIAGNOSIS — E1149 Type 2 diabetes mellitus with other diabetic neurological complication: Secondary | ICD-10-CM | POA: Insufficient documentation

## 2013-04-30 DIAGNOSIS — E118 Type 2 diabetes mellitus with unspecified complications: Secondary | ICD-10-CM

## 2013-04-30 DIAGNOSIS — E871 Hypo-osmolality and hyponatremia: Secondary | ICD-10-CM

## 2013-04-30 DIAGNOSIS — F1420 Cocaine dependence, uncomplicated: Secondary | ICD-10-CM

## 2013-04-30 DIAGNOSIS — Z87891 Personal history of nicotine dependence: Secondary | ICD-10-CM | POA: Insufficient documentation

## 2013-04-30 DIAGNOSIS — R569 Unspecified convulsions: Secondary | ICD-10-CM

## 2013-04-30 DIAGNOSIS — Z9119 Patient's noncompliance with other medical treatment and regimen: Secondary | ICD-10-CM

## 2013-04-30 DIAGNOSIS — F1410 Cocaine abuse, uncomplicated: Secondary | ICD-10-CM

## 2013-04-30 DIAGNOSIS — F1910 Other psychoactive substance abuse, uncomplicated: Secondary | ICD-10-CM

## 2013-04-30 DIAGNOSIS — M19049 Primary osteoarthritis, unspecified hand: Secondary | ICD-10-CM | POA: Insufficient documentation

## 2013-04-30 DIAGNOSIS — Z91199 Patient's noncompliance with other medical treatment and regimen due to unspecified reason: Secondary | ICD-10-CM

## 2013-04-30 DIAGNOSIS — K859 Acute pancreatitis without necrosis or infection, unspecified: Secondary | ICD-10-CM | POA: Insufficient documentation

## 2013-04-30 DIAGNOSIS — E876 Hypokalemia: Secondary | ICD-10-CM

## 2013-04-30 LAB — COMPREHENSIVE METABOLIC PANEL
ALT: 13 U/L (ref 0–53)
AST: 20 U/L (ref 0–37)
Albumin: 2.8 g/dL — ABNORMAL LOW (ref 3.5–5.2)
Alkaline Phosphatase: 99 U/L (ref 39–117)
BUN: 9 mg/dL (ref 6–23)
CHLORIDE: 91 meq/L — AB (ref 96–112)
CO2: 22 meq/L (ref 19–32)
CREATININE: 0.89 mg/dL (ref 0.50–1.35)
Calcium: 8.7 mg/dL (ref 8.4–10.5)
GFR calc Af Amer: >90 mL/min (ref >90)
Glucose, Bld: 677 mg/dL (ref 70–99)
Potassium: 3.8 mEq/L (ref 3.7–5.3)
Sodium: 132 mEq/L — ABNORMAL LOW (ref 137–147)
Total Protein: 6.6 g/dL (ref 6.0–8.3)

## 2013-04-30 LAB — CBG MONITORING, ED
GLUCOSE-CAPILLARY: 106 mg/dL — AB (ref 70–99)
Glucose-Capillary: >600 mg/dL (ref 70–99)
Glucose-Capillary: >600 mg/dL (ref 70–99)
Glucose-Capillary: 482 mg/dL — ABNORMAL HIGH (ref 70–99)
Glucose-Capillary: 342 mg/dL — ABNORMAL HIGH (ref 70–99)
Glucose-Capillary: 397 mg/dL — ABNORMAL HIGH (ref 70–99)
Glucose-Capillary: 274 mg/dL — ABNORMAL HIGH (ref 70–99)
Glucose-Capillary: 88 mg/dL (ref 70–99)
Glucose-Capillary: 90 mg/dL (ref 70–99)

## 2013-04-30 LAB — ETHANOL: Alcohol, Ethyl (B): 50 mg/dL — ABNORMAL HIGH (ref 0–11)

## 2013-04-30 LAB — RAPID URINE DRUG SCREEN, HOSP PERFORMED
AMPHETAMINES: NOT DETECTED
BENZODIAZEPINES: NOT DETECTED
Barbiturates: NOT DETECTED
COCAINE: POSITIVE — AB
Opiates: NOT DETECTED
Tetrahydrocannabinol: POSITIVE — AB

## 2013-04-30 LAB — URINALYSIS, ROUTINE W REFLEX MICROSCOPIC
BILIRUBIN URINE: NEGATIVE
Glucose, UA: >1000 mg/dL — AB
Hgb urine dipstick: NEGATIVE
KETONES UR: NEGATIVE mg/dL
Leukocytes, UA: NEGATIVE
Nitrite: NEGATIVE
Protein, ur: NEGATIVE mg/dL
Specific Gravity, Urine: 1.03 (ref 1.005–1.030)
UROBILINOGEN UA: 0.2 mg/dL (ref 0.0–1.0)
pH: 6 (ref 5.0–8.0)

## 2013-04-30 LAB — PHENYTOIN LEVEL, TOTAL: Phenytoin Lvl: <2.5 ug/mL — ABNORMAL LOW (ref 10.0–20.0)

## 2013-04-30 LAB — MAGNESIUM: MAGNESIUM: 2 mg/dL (ref 1.5–2.5)

## 2013-04-30 LAB — URINE MICROSCOPIC-ADD ON

## 2013-04-30 LAB — LIPASE, BLOOD: LIPASE: 13 U/L (ref 11–59)

## 2013-04-30 MED ORDER — CHLORDIAZEPOXIDE HCL 25 MG PO CAPS: 25.0000 mg | ORAL_CAPSULE | ORAL | Status: DC

## 2013-04-30 MED ORDER — SODIUM CHLORIDE 0.9 % IV BOLUS (SEPSIS)
1000.0000 mL | Freq: Once | INTRAVENOUS | Status: AC
  Administered 2013-04-30: 1000 mL via INTRAVENOUS

## 2013-04-30 MED ORDER — HYDROXYZINE HCL 25 MG PO TABS: 25.0000 mg | ORAL_TABLET | Freq: Four times a day (QID) | ORAL | Status: DC | PRN

## 2013-04-30 MED ORDER — INSULIN ASPART 100 UNIT/ML ~~LOC~~ SOLN
0.0000 [IU] | SUBCUTANEOUS | Status: DC
  Administered 2013-04-30: 12 [IU] via SUBCUTANEOUS
  Administered 2013-04-30: 20 [IU] via SUBCUTANEOUS
  Administered 2013-05-01: 2 [IU] via SUBCUTANEOUS
  Administered 2013-05-01: 4 [IU] via SUBCUTANEOUS
  Administered 2013-05-01: 2 [IU] via SUBCUTANEOUS
  Filled 2013-04-30 (×6): qty 1

## 2013-04-30 MED ORDER — INSULIN ASPART 100 UNIT/ML ~~LOC~~ SOLN
6.0000 [IU] | Freq: Once | SUBCUTANEOUS | Status: AC
  Administered 2013-04-30: 6 [IU] via SUBCUTANEOUS
  Filled 2013-04-30: qty 1

## 2013-04-30 MED ORDER — HYDROMORPHONE HCL PF 1 MG/ML IJ SOLN
1.0000 mg | Freq: Once | INTRAMUSCULAR | Status: AC
  Administered 2013-04-30: 1 mg via INTRAVENOUS
  Filled 2013-04-30: qty 1

## 2013-04-30 MED ORDER — ADULT MULTIVITAMIN W/MINERALS CH
1.0000 | ORAL_TABLET | Freq: Every day | ORAL | Status: DC
  Administered 2013-04-30 – 2013-05-01 (×2): 1 via ORAL
  Filled 2013-04-30 (×2): qty 1

## 2013-04-30 MED ORDER — INSULIN ASPART PROT & ASPART (70-30 MIX) 100 UNIT/ML ~~LOC~~ SUSP
20.0000 [IU] | Freq: Two times a day (BID) | SUBCUTANEOUS | Status: DC
  Administered 2013-04-30 – 2013-05-01 (×2): 20 [IU] via SUBCUTANEOUS
  Filled 2013-04-30: qty 10

## 2013-04-30 MED ORDER — LORAZEPAM 1 MG PO TABS: 0.0000 mg | ORAL_TABLET | Freq: Four times a day (QID) | ORAL | Status: DC

## 2013-04-30 MED ORDER — PANCRELIPASE (LIP-PROT-AMYL) 12000-38000 UNITS PO CPEP
1.0000 | ORAL_CAPSULE | Freq: Three times a day (TID) | ORAL | Status: DC
  Administered 2013-04-30 – 2013-05-01 (×4): 1 via ORAL
  Filled 2013-04-30 (×6): qty 1

## 2013-04-30 MED ORDER — FLUOXETINE HCL 20 MG PO CAPS
40.0000 mg | ORAL_CAPSULE | Freq: Two times a day (BID) | ORAL | Status: DC
  Administered 2013-04-30 – 2013-05-01 (×3): 40 mg via ORAL
  Filled 2013-04-30 (×4): qty 2

## 2013-04-30 MED ORDER — CHLORDIAZEPOXIDE HCL 25 MG PO CAPS
25.0000 mg | ORAL_CAPSULE | Freq: Four times a day (QID) | ORAL | Status: DC | PRN
  Administered 2013-04-30: 25 mg via ORAL

## 2013-04-30 MED ORDER — CHLORDIAZEPOXIDE HCL 25 MG PO CAPS
25.0000 mg | ORAL_CAPSULE | Freq: Four times a day (QID) | ORAL | Status: AC
  Administered 2013-04-30 (×3): 25 mg via ORAL
  Filled 2013-04-30 (×4): qty 1

## 2013-04-30 MED ORDER — INSULIN ASPART PROT & ASPART (70-30 MIX) 100 UNIT/ML ~~LOC~~ SUSP: 20.0000 [IU] | Freq: Two times a day (BID) | SUBCUTANEOUS | Status: DC

## 2013-04-30 MED ORDER — PHENYTOIN SODIUM EXTENDED 100 MG PO CAPS
200.0000 mg | ORAL_CAPSULE | Freq: Every day | ORAL | Status: DC
  Administered 2013-04-30: 200 mg via ORAL
  Filled 2013-04-30: qty 2

## 2013-04-30 MED ORDER — ONDANSETRON HCL 4 MG/2ML IJ SOLN
4.0000 mg | Freq: Once | INTRAMUSCULAR | Status: AC
  Administered 2013-04-30: 4 mg via INTRAVENOUS
  Filled 2013-04-30: qty 2

## 2013-04-30 MED ORDER — HYDROXYZINE HCL 50 MG PO TABS
200.0000 mg | ORAL_TABLET | Freq: Two times a day (BID) | ORAL | Status: DC
  Filled 2013-04-30 (×2): qty 4

## 2013-04-30 MED ORDER — INSULIN ASPART 100 UNIT/ML IV SOLN
8.0000 [IU] | Freq: Once | INTRAVENOUS | Status: AC
  Administered 2013-04-30: 8 [IU] via INTRAVENOUS
  Filled 2013-04-30: qty 0.08

## 2013-04-30 MED ORDER — GABAPENTIN 300 MG PO CAPS
900.0000 mg | ORAL_CAPSULE | Freq: Three times a day (TID) | ORAL | Status: DC
  Administered 2013-04-30 – 2013-05-01 (×4): 900 mg via ORAL
  Filled 2013-04-30 (×6): qty 3

## 2013-04-30 MED ORDER — MELOXICAM 7.5 MG PO TABS
7.5000 mg | ORAL_TABLET | Freq: Every evening | ORAL | Status: DC
  Administered 2013-04-30: 7.5 mg via ORAL
  Filled 2013-04-30 (×2): qty 1

## 2013-04-30 MED ORDER — VITAMIN B-1 100 MG PO TABS
100.0000 mg | ORAL_TABLET | Freq: Every day | ORAL | Status: DC
  Administered 2013-05-01: 100 mg via ORAL
  Filled 2013-04-30: qty 1

## 2013-04-30 MED ORDER — ACETAMINOPHEN 325 MG PO TABS
650.0000 mg | ORAL_TABLET | Freq: Once | ORAL | Status: AC
  Administered 2013-04-30: 650 mg via ORAL
  Filled 2013-04-30: qty 2

## 2013-04-30 MED ORDER — CHLORDIAZEPOXIDE HCL 25 MG PO CAPS: 25.0000 mg | ORAL_CAPSULE | Freq: Every day | ORAL | Status: DC

## 2013-04-30 MED ORDER — LORAZEPAM 1 MG PO TABS: 0.0000 mg | ORAL_TABLET | Freq: Two times a day (BID) | ORAL | Status: DC

## 2013-04-30 MED ORDER — INSULIN ASPART 100 UNIT/ML ~~LOC~~ SOLN: 5.0000 [IU] | Freq: Once | SUBCUTANEOUS | Status: DC

## 2013-04-30 MED ORDER — CHLORDIAZEPOXIDE HCL 25 MG PO CAPS
25.0000 mg | ORAL_CAPSULE | Freq: Three times a day (TID) | ORAL | Status: DC
  Administered 2013-05-01: 25 mg via ORAL
  Filled 2013-04-30: qty 1

## 2013-04-30 NOTE — Progress Notes (Addendum)
Per discussion with EDP, once patient medically stable plan is for patient to discharge home. No consult for SW or tts at this time.   Byrd HesselbachKristen Eloyce Bultman, LCSW [phone]  ED CSW 04/30/2013 749am

## 2013-04-30 NOTE — BH Assessment (Signed)
[facility]. Writer faxed a referral to [facility].

## 2013-04-30 NOTE — Progress Notes (Signed)
Writer faxed referral to [facility]; [facility]  Declined[facility] at [facility]  [place], MHT

## 2013-04-30 NOTE — ED Notes (Signed)
Pt awake and alert resting in room. -SI/HI, -A/V hall, UA sent and resulted, CBG 90, parameters not met for insulin. Will continue to monitor closely and maintain safety.

## 2013-04-30 NOTE — Consult Note (Signed)
[facility] Consult   Reason for Consult:  Requesting alcohol detox Referring Physician:  ER MD  Gabriel Rush is an 53 y.o. male. Total Time spent with patient: 45 minutes  Assessment: AXIS I:  alcohol dependence, marijuana dependence, cocaine dependence AXIS II:  Deferred AXIS III:   Past Medical History  Diagnosis Date  . Hypertension   . Pancreatitis   . Alcohol abuse   . Chronic abdominal pain   . Alcoholism /alcohol abuse   . Neuromuscular disorder 07/02/2012    Diabetic neuropathy  . Hypercholesteremia   . Type II diabetes mellitus   . Depression   . Diabetic peripheral neuropathy   . Grand mal     "maybe once/month; had one for a whole week just prior to coming here; black out so many times" (12/04/2012)  . Headache(784.0)     "weekly; can get severe; never dx'd w/migraines" (12/04/2012)  . Arthritis     "hands & arms" (12/04/2012)  . Anxiety    AXIS IV:  economic problems, housing problems, occupational problems and chronic polysubstance dependence and diabetes AXIS V:  51-60 moderate symptoms  Plan:  No evidence of imminent risk to self or others at present.    Subjective:   Gabriel Rush is a 53 y.o. male patient admitted with requesting alcohol detox.  HPI:  Gabriel Rush came to the ER because his diabetes was out of control secondary to not taking any diabetic medication for several days.  While here he added he was drinking 12 beers daily, using cocaine and marijuana daily and that was where his money was going.  Admits to depression but no suicidal ideation.  He is homeless and says he has no family or friend support.  Last detox was a year ago, he believes.  Today's blood alcohol level was 50. HPI Elements:   Location:  alcohol dependence. Quality:  12 beers daily. Severity:  as above. Timing:  no precipitants. Duration:  years. Context:  dependence.  Past Psychiatric History: Past Medical History  Diagnosis Date  . Hypertension    . Pancreatitis   . Alcohol abuse   . Chronic abdominal pain   . Alcoholism /alcohol abuse   . Neuromuscular disorder 07/02/2012    Diabetic neuropathy  . Hypercholesteremia   . Type II diabetes mellitus   . Depression   . Diabetic peripheral neuropathy   . Grand mal     "maybe once/month; had one for a whole week just prior to coming here; black out so many times" (12/04/2012)  . Headache(784.0)     "weekly; can get severe; never dx'd w/migraines" (12/04/2012)  . Arthritis     "hands & arms" (12/04/2012)  . Anxiety     reports that he quit smoking about 28 years ago. His smoking use included Cigarettes. He has a 4 pack-year smoking history. He has never used smokeless tobacco. He reports that he drinks about 3.6 ounces of alcohol per week. He reports that he uses illicit drugs (Marijuana, "Crack" cocaine, and Cocaine). Family History  Problem Relation Age of Onset  . Hypertension    . Hypertension Mother            Allergies:   Allergies  Allergen Reactions  . Morphine And Related Itching    ACT Assessment Complete:  Yes:    Educational Status    Risk to Self: Risk to self Is patient at risk for suicide?: No Substance abuse history and/or treatment for substance abuse?: Yes  Risk  to Others:    Abuse:    Prior Inpatient Therapy:    Prior Outpatient Therapy:    Additional Information:                    Objective: Blood pressure 115/75, pulse 87, temperature 98.4 F (36.9 C), temperature source Oral, resp. rate 11, SpO2 98.00%.There is no weight on file to calculate BMI. Results for orders placed during the hospital encounter of 04/30/13 (from the past 72 hour(s))  CBG MONITORING, ED     Status: Abnormal   Collection Time    04/30/13  2:52 AM      Result Value Ref Range   Glucose-Capillary >600 (*) 70 - 99 mg/dL  URINALYSIS, ROUTINE W REFLEX MICROSCOPIC     Status: Abnormal   Collection Time    04/30/13  4:10 AM      Result Value Ref Range    Color, Urine YELLOW  YELLOW   APPearance CLEAR  CLEAR   Specific Gravity, Urine 1.030  1.005 - 1.030   pH 6.0  5.0 - 8.0   Glucose, UA >1000 (*) NEGATIVE mg/dL   Hgb urine dipstick NEGATIVE  NEGATIVE   Bilirubin Urine NEGATIVE  NEGATIVE   Ketones, ur NEGATIVE  NEGATIVE mg/dL   Protein, ur NEGATIVE  NEGATIVE mg/dL   Urobilinogen, UA 0.2  0.0 - 1.0 mg/dL   Nitrite NEGATIVE  NEGATIVE   Leukocytes, UA NEGATIVE  NEGATIVE  URINE MICROSCOPIC-ADD ON     Status: None   Collection Time    04/30/13  4:10 AM      Result Value Ref Range   WBC, UA 0-2  <3 WBC/hpf   RBC / HPF 0-2  <3 RBC/hpf  COMPREHENSIVE METABOLIC PANEL     Status: Abnormal   Collection Time    04/30/13  4:13 AM      Result Value Ref Range   Sodium 132 (*) 137 - 147 mEq/L   Potassium 3.8  3.7 - 5.3 mEq/L   Chloride 91 (*) 96 - 112 mEq/L   CO2 22  19 - 32 mEq/L   Glucose, Bld 677 (*) 70 - 99 mg/dL   Comment: CRITICAL RESULT CALLED TO, READ BACK BY AND VERIFIED WITH:     OKOROJI,C/ED $[email or web address]'[email or web address]$  ON 04/30/13 BY KARCZEWSKI,S.   BUN 9  6 - 23 mg/dL   Creatinine, Ser 0.89  0.50 - 1.35 mg/dL   Calcium 8.7  8.4 - 10.5 mg/dL   Total Protein 6.6  6.0 - 8.3 g/dL   Albumin 2.8 (*) 3.5 - 5.2 g/dL   AST 20  0 - 37 U/L   ALT 13  0 - 53 U/L   Alkaline Phosphatase 99  39 - 117 U/L   Total Bilirubin <0.2 (*) 0.3 - 1.2 mg/dL   GFR calc non Af Amer >90  >90 mL/min   GFR calc Af Amer >90  >90 mL/min   Comment: (NOTE)     The eGFR has been calculated using the CKD EPI equation.     This calculation has not been validated in all clinical situations.     eGFR's persistently <90 mL/min signify possible Chronic Kidney     Disease.  LIPASE, BLOOD     Status: None   Collection Time    04/30/13  4:13 AM      Result Value Ref Range   Lipase 13  11 - 59 U/L  MAGNESIUM     Status: None   Collection Time  04/30/13  4:13 AM      Result Value Ref Range   Magnesium 2.0  1.5 - 2.5 mg/dL  ETHANOL     Status: Abnormal   Collection Time    04/30/13   4:13 AM      Result Value Ref Range   Alcohol, Ethyl (B) 50 (*) 0 - 11 mg/dL   Comment:            LOWEST DETECTABLE LIMIT FOR     SERUM ALCOHOL IS 11 mg/dL     FOR MEDICAL PURPOSES ONLY  PHENYTOIN LEVEL, TOTAL     Status: Abnormal   Collection Time    04/30/13  4:13 AM      Result Value Ref Range   Phenytoin Lvl <2.5 (*) 10.0 - 20.0 ug/mL   Comment: Performed at [facility], ED     Status: Abnormal   Collection Time    04/30/13  4:18 AM      Result Value Ref Range   Glucose-Capillary >600 (*) 70 - 99 mg/dL  CBG MONITORING, ED     Status: Abnormal   Collection Time    04/30/13  6:29 AM      Result Value Ref Range   Glucose-Capillary 482 (*) 70 - 99 mg/dL  CBG MONITORING, ED     Status: Abnormal   Collection Time    04/30/13  8:41 AM      Result Value Ref Range   Glucose-Capillary 342 (*) 70 - 99 mg/dL   Comment 1 Notify RN     Labs are reviewed and are pertinent for elevated blood sugars and BAL 50.  Current Facility-Administered Medications  Medication Dose Route Frequency Provider Last Rate Last Dose  . chlordiazePOXIDE (LIBRIUM) capsule 25 mg  25 mg Oral QID Clarene Reamer, MD   25 mg at 04/30/13 1113   Followed by  . [START ON 05/01/2013] chlordiazePOXIDE (LIBRIUM) capsule 25 mg  25 mg Oral TID Clarene Reamer, MD       Followed by  . [START ON 05/02/2013] chlordiazePOXIDE (LIBRIUM) capsule 25 mg  25 mg Oral BH-qamhs Clarene Reamer, MD       Followed by  . [START ON 05/03/2013] chlordiazePOXIDE (LIBRIUM) capsule 25 mg  25 mg Oral Daily Clarene Reamer, MD      . chlordiazePOXIDE (LIBRIUM) capsule 25 mg  25 mg Oral Q6H PRN Clarene Reamer, MD      . FLUoxetine (PROZAC) capsule 40 mg  40 mg Oral BID Jasper Riling. Pickering, MD   40 mg at 04/30/13 0948  . gabapentin (NEURONTIN) capsule 900 mg  900 mg Oral TID Jasper Riling. Pickering, MD   900 mg at 04/30/13 0948  . hydrOXYzine (ATARAX/VISTARIL) tablet 25 mg  25 mg Oral Q6H PRN Clarene Reamer, MD      .  insulin aspart protamine- aspart (NOVOLOG MIX 70/30) injection 20 Units  20 Units Subcutaneous BID WC Nathan R. Pickering, MD      . lipase/protease/amylase (CREON-12/PANCREASE) capsule 1 capsule  1 capsule Oral TID WC Nathan R. Pickering, MD   1 capsule at 04/30/13 1113  . meloxicam (MOBIC) tablet 7.5 mg  7.5 mg Oral QPM Nathan R. Pickering, MD      . multivitamin with minerals tablet 1 tablet  1 tablet Oral Daily Clarene Reamer, MD   1 tablet at 04/30/13 1113  . phenytoin (DILANTIN) ER capsule 200 mg  200 mg Oral QHS Ovid Curd  Rita Ohara, MD      . Derrill Memo ON 05/01/2013] thiamine (VITAMIN B-1) tablet 100 mg  100 mg Oral Daily Clarene Reamer, MD       Current Outpatient Prescriptions  Medication Sig Dispense Refill  . FLUoxetine (PROZAC) 40 MG capsule Take 80 mg by mouth 2 (two) times daily.      Marland Kitchen gabapentin (NEURONTIN) 300 MG capsule Take 900 mg by mouth 3 (three) times daily.      . hydrOXYzine (VISTARIL) 100 MG capsule Take 200 mg by mouth 2 (two) times daily.      . insulin aspart protamine- aspart (NOVOLOG MIX 70/30) (70-30) 100 UNIT/ML injection Inject 0.2 mLs (20 Units total) into the skin 2 (two) times daily with a meal.  10 mL  11  . lipase/protease/amylase (CREON-12/PANCREASE) 12000 UNITS CPEP capsule Take 1 capsule by mouth 3 (three) times daily with meals.      Marland Kitchen loperamide (IMODIUM) 2 MG capsule Take 1 capsule (2 mg total) by mouth 4 (four) times daily as needed for diarrhea or loose stools.  12 capsule  0  . meloxicam (MOBIC) 7.5 MG tablet Take 7.5 mg by mouth every evening.       . phenytoin (DILANTIN) 200 MG ER capsule Take 200 mg by mouth at bedtime.      . insulin aspart protamine- aspart (NOVOLOG MIX 70/30) (70-30) 100 UNIT/ML injection Inject 0.2 mLs (20 Units total) into the skin 2 (two) times daily with a meal.  10 mL  1  . ketorolac (TORADOL) 10 MG tablet Take 10 mg by mouth every 6 (six) hours as needed (pain.).      Marland Kitchen traMADol (ULTRAM) 50 MG tablet Take 100 mg by mouth 3  (three) times daily as needed (pain.).        Psychiatric Specialty Exam:     Blood pressure 115/75, pulse 87, temperature 98.4 F (36.9 C), temperature source Oral, resp. rate 11, SpO2 98.00%.There is no weight on file to calculate BMI.  General Appearance: Casual  Eye Contact::  Good  Speech:  Clear and Coherent  Volume:  Normal  Mood:  Euthymic  Affect:  Appropriate  Thought Process:  Coherent and Logical  Orientation:  Full (Time, Place, and Person)  Thought Content:  Negative  Suicidal Thoughts:  No  Homicidal Thoughts:  No  Memory:  Immediate;   Good Recent;   Good Remote;   Good  Judgement:  Intact  Insight:  Fair  Psychomotor Activity:  Normal  Concentration:  Good  Recall:  Good  Fund of Knowledge:Good  Language: Good  Akathisia:  Negative  Handed:  Right  AIMS (if indicated):     Assets:  Communication Skills Desire for Improvement  Sleep:      Musculoskeletal: Strength & Muscle Tone: within normal limits Gait & Station: normal Patient leans: N/A  Treatment Plan Summary: Daily contact with patient to assess and evaluate symptoms and progress in treatment Medication management Recommend inpatient treatment for alcohol detox  Kitty Cadavid D 04/30/2013 12:25 PM

## 2013-04-30 NOTE — ED Provider Notes (Signed)
CSN: [id number]     Arrival date & time 04/30/13  0056 History   First MD Initiated Contact with Patient 04/30/13 0344     Chief Complaint  Patient presents with  . Medical Clearance     (Consider location/radiation/quality/duration/timing/severity/associated sxs/prior Treatment) HPI Mr. Gabriel Rush is a 53 yo man with poorly controlled insulin dependent type II DM who presents with complaints of "my sugar is high and I want detox". Patient has been out of all insulin for > 3 days. He has run out of prescription refills. He reports 3 days of polyuria, polydypsia and increasingly severe weakness and malaise.  He has vomited 4-5 times in the past day.   The patient also has diffuse and cramping abdominal pain along with diarrhea. His abdominal pain is waxing and waning in severity. Nothing worsens or relieves. Patient is s/p appendectomy, remotely.   As far as substance abuse issues go. This is a secondary concern. The patient uses cocaine and marijuana daily. He drinks about 12 twelve ounce beers per day. He says he wants to go through a detox program because of his abdominal pain. The patient has a history of alcoholic pancreatitis and says that his pain feels reminiscent of previous episodes of pancreatitis.    Past Medical History  Diagnosis Date  . Hypertension   . Pancreatitis   . Alcohol abuse   . Chronic abdominal pain   . Alcoholism /alcohol abuse   . Neuromuscular disorder 07/02/2012    Diabetic neuropathy  . Hypercholesteremia   . Type II diabetes mellitus   . Depression   . Diabetic peripheral neuropathy   . Grand mal     "maybe once/month; had one for a whole week just prior to coming here; black out so many times" (12/04/2012)  . Headache(784.0)     "weekly; can get severe; never dx'd w/migraines" (12/04/2012)  . Arthritis     "hands & arms" (12/04/2012)  . Anxiety    Past Surgical History  Procedure Laterality Date  . Appendectomy  1970's   Family History  Problem  Relation Age of Onset  . Hypertension    . Hypertension Mother    History  Substance Use Topics  . Smoking status: Former Smoker -- 0.50 packs/day for 8 years    Types: Cigarettes    Quit date: 12/21/1984  . Smokeless tobacco: Never Used  . Alcohol Use: 3.6 oz/week    6 Cans of beer per week     Comment: heavy    Review of Systems Ten point review of symptoms performed and is negative with the exception of symptoms noted above.     Allergies  Morphine and related  Home Medications   Current Outpatient Rx  Name  Route  Sig  Dispense  Refill  . FLUoxetine (PROZAC) 40 MG capsule   Oral   Take 80 mg by mouth 2 (two) times daily.         Marland Kitchen gabapentin (NEURONTIN) 300 MG capsule   Oral   Take 900 mg by mouth 3 (three) times daily.         . hydrOXYzine (VISTARIL) 100 MG capsule   Oral   Take 200 mg by mouth 2 (two) times daily.         . insulin aspart protamine- aspart (NOVOLOG MIX 70/30) (70-30) 100 UNIT/ML injection   Subcutaneous   Inject 0.2 mLs (20 Units total) into the skin 2 (two) times daily with a meal.   10 mL  11   . lipase/protease/amylase (CREON-12/PANCREASE) 12000 UNITS CPEP capsule   Oral   Take 1 capsule by mouth 3 (three) times daily with meals.         Marland Kitchen loperamide (IMODIUM) 2 MG capsule   Oral   Take 1 capsule (2 mg total) by mouth 4 (four) times daily as needed for diarrhea or loose stools.   12 capsule   0   . meloxicam (MOBIC) 7.5 MG tablet   Oral   Take 7.5 mg by mouth every evening.          . phenytoin (DILANTIN) 200 MG ER capsule   Oral   Take 200 mg by mouth at bedtime.         Marland Kitchen ketorolac (TORADOL) 10 MG tablet   Oral   Take 10 mg by mouth every 6 (six) hours as needed (pain.).         Marland Kitchen traMADol (ULTRAM) 50 MG tablet   Oral   Take 100 mg by mouth 3 (three) times daily as needed (pain.).          BP 126/86  Pulse 96  Temp(Src) 98.4 F (36.9 C) (Oral)  Resp 20  SpO2 100% Physical Exam Gen: well  developed and well nourished appearing, the patient appears to be sleeping soundly but he is easily arousable Head: NCAT Eyes: PERL, EOMI Nose: no epistaixis or rhinorrhea Mouth/throat: mucosa is moist and pink Neck: supple, no stridor Lungs: CTA B, no wheezing, rhonchi or rales CV: RRR, pulse 88 bpm, no murmur, extremities appear well perfused.  Abd: soft, notender, nondistended Back: no ttp, no cva ttp Skin: warm and dry Ext: normal to inspection, no dependent edema Neuro: CN ii-xii grossly intact, no focal deficits Psyche; normal affect,  calm and cooperative.   ED Course  Procedures (including critical care time) Labs Review  Results for orders placed during the hospital encounter of 04/30/13 (from the past 24 hour(s))  CBG MONITORING, ED     Status: Abnormal   Collection Time    04/30/13  2:52 AM      Result Value Ref Range   Glucose-Capillary >600 (*) 70 - 99 mg/dL  URINALYSIS, ROUTINE W REFLEX MICROSCOPIC     Status: Abnormal   Collection Time    04/30/13  4:10 AM      Result Value Ref Range   Color, Urine YELLOW  YELLOW   APPearance CLEAR  CLEAR   Specific Gravity, Urine 1.030  1.005 - 1.030   pH 6.0  5.0 - 8.0   Glucose, UA >1000 (*) NEGATIVE mg/dL   Hgb urine dipstick NEGATIVE  NEGATIVE   Bilirubin Urine NEGATIVE  NEGATIVE   Ketones, ur NEGATIVE  NEGATIVE mg/dL   Protein, ur NEGATIVE  NEGATIVE mg/dL   Urobilinogen, UA 0.2  0.0 - 1.0 mg/dL   Nitrite NEGATIVE  NEGATIVE   Leukocytes, UA NEGATIVE  NEGATIVE  URINE MICROSCOPIC-ADD ON     Status: None   Collection Time    04/30/13  4:10 AM      Result Value Ref Range   WBC, UA 0-2  <3 WBC/hpf   RBC / HPF 0-2  <3 RBC/hpf  COMPREHENSIVE METABOLIC PANEL     Status: Abnormal   Collection Time    04/30/13  4:13 AM      Result Value Ref Range   Sodium 132 (*) 137 - 147 mEq/L   Potassium 3.8  3.7 - 5.3 mEq/L   Chloride 91 (*) 96 -  112 mEq/L   CO2 22  19 - 32 mEq/L   Glucose, Bld 677 (*) 70 - 99 mg/dL   BUN 9  6 -  23 mg/dL   Creatinine, Ser 1.610.89  0.50 - 1.35 mg/dL   Calcium 8.7  8.4 - 09.610.5 mg/dL   Total Protein 6.6  6.0 - 8.3 g/dL   Albumin 2.8 (*) 3.5 - 5.2 g/dL   AST 20  0 - 37 U/L   ALT 13  0 - 53 U/L   Alkaline Phosphatase 99  39 - 117 U/L   Total Bilirubin <0.2 (*) 0.3 - 1.2 mg/dL   GFR calc non Af Amer >90  >90 mL/min   GFR calc Af Amer >90  >90 mL/min  LIPASE, BLOOD     Status: None   Collection Time    04/30/13  4:13 AM      Result Value Ref Range   Lipase 13  11 - 59 U/L  MAGNESIUM     Status: None   Collection Time    04/30/13  4:13 AM      Result Value Ref Range   Magnesium 2.0  1.5 - 2.5 mg/dL  ETHANOL     Status: Abnormal   Collection Time    04/30/13  4:13 AM      Result Value Ref Range   Alcohol, Ethyl (B) 50 (*) 0 - 11 mg/dL  CBG MONITORING, ED     Status: Abnormal   Collection Time    04/30/13  4:18 AM      Result Value Ref Range   Glucose-Capillary >600 (*) 70 - 99 mg/dL  CBG MONITORING, ED     Status: Abnormal   Collection Time    04/30/13  6:29 AM      Result Value Ref Range   Glucose-Capillary 482 (*) 70 - 99 mg/dL    MDM   Patient with non-ketotic hyperglycemia secondary to non-compliance with insulin regimen for > 72 hours. Good response, thus far, to tx with IVF and insulin bolus. Glucose now in 470s. We will tx with a second dose of insulin [place] and re-check glucose level in about 1 hour. If < 400, the patient may be safely discharged.   He is a chronic polysubstance abuser. He is not an acute threat to his own safety or the safety of others. He has no indication for acute psychiatric admission. We have fluid resuscitated.     Brandt LoosenJulie Slayton Lubitz, MD 04/30/13 [phone]

## 2013-04-30 NOTE — ED Notes (Signed)
Removed IV from RFA not previously documented.

## 2013-04-30 NOTE — BH Assessment (Signed)
[facility] per Melisa as he is a absconder from probation. [facility]

## 2013-04-30 NOTE — ED Notes (Signed)
Pt states he is here to get his diabetes under control and to be detoxed from weed, alcohol, and cocaine  Pt states last drank about 4 hrs ago and last smoked and did cocaine around that time too  Pt states he came in because he does not feel well Denies SI/HI at this time

## 2013-04-30 NOTE — Consult Note (Signed)
  Review of Systems  Constitutional: Positive for malaise/fatigue.  HENT: Negative.   Eyes: Negative.   Respiratory: Negative.   Cardiovascular: Negative.   Gastrointestinal: Negative.   Genitourinary: Positive for frequency.  Musculoskeletal: Negative.   Skin: Negative.   Neurological: Negative.   Endo/Heme/Allergies: Negative.   Psychiatric/Behavioral: Positive for depression.   Diabetes is coming under control with insulin, no great withdrawal symptom st this point but Librium has been started already.

## 2013-04-30 NOTE — ED Notes (Signed)
Patient given a specimen cup and was asked to give a urine specimen

## 2013-04-30 NOTE — ED Notes (Addendum)
Pt belongings were inventoried on belongings sheet (located in shadow chart)  after pt was brought to unit. Belongings sheet was signed by this Clinical research associatewriter and pt after it was filled out. Belongings included: 1 black bag, 1 multicolored backpack (neither were opened), 2 pt belonging bags (detailed list on belongings sheet in shadow chart)

## 2013-04-30 NOTE — Progress Notes (Signed)
Writer informed the [facility] Counselor [facility] about the consult request.

## 2013-05-01 LAB — CBG MONITORING, ED
GLUCOSE-CAPILLARY: 144 mg/dL — AB (ref 70–99)
Glucose-Capillary: 164 mg/dL — ABNORMAL HIGH (ref 70–99)
Glucose-Capillary: 176 mg/dL — ABNORMAL HIGH (ref 70–99)

## 2013-05-01 MED ORDER — HYDROXYZINE HCL 25 MG PO TABS: 50.0000 mg | ORAL_TABLET | Freq: Four times a day (QID) | ORAL | Status: DC | PRN

## 2013-05-01 MED ORDER — FLUOXETINE HCL 40 MG PO CAPS: 80.0000 mg | ORAL_CAPSULE | Freq: Two times a day (BID) | ORAL | Status: DC

## 2013-05-01 NOTE — [facility] Suicide Risk Assessment (Signed)
Suicide Risk Assessment  Discharge Assessment     Demographic Factors:  Male  Total Time spent with patient: 45 minutes  Psychiatric Specialty Exam:     Blood pressure 117/82, pulse 96, temperature 97.8 F (36.6 C), temperature source Oral, resp. rate 16, SpO2 100.00%.There is no weight on file to calculate BMI.  General Appearance: Fairly Groomed  Patent attorneyye Contact::  Good  Speech:  Clear and Coherent  Volume:  Normal  Mood:  Euthymic  Affect:  Appropriate  Thought Process:  Coherent  Orientation:  Full (Time, Place, and Person)  Thought Content:  Negative  Suicidal Thoughts:  No  Homicidal Thoughts:  No  Memory:  Immediate;   Good Recent;   Good Remote;   Good  Judgement:  Good  Insight:  Fair  Psychomotor Activity:  Normal  Concentration:  Good  Recall:  Good  Fund of Knowledge:Good  Language: Good  Akathisia:  Negative  Handed:  Right  AIMS (if indicated):     Assets:  Communication Skills  Sleep:       Musculoskeletal: Strength & Muscle Tone: within normal limits Gait & Station: normal Patient leans: N/A   Mental Status Per Nursing Assessment::   On Admission:     Current Mental Status by Physician: NA  Loss Factors: NA  Historical Factors: Impulsivity  Risk Reduction Factors:   wants to stop using drugs  Continued Clinical Symptoms:  Alcohol/Substance Abuse/Dependencies  Cognitive Features That Contribute To Risk:  Closed-mindedness    Suicide Risk:  Minimal: No identifiable suicidal ideation.  Patients presenting with no risk factors but with morbid ruminations; may be classified as minimal risk based on the severity of the depressive symptoms  Discharge Diagnoses:   AXIS I:  alcohol dependence AXIS II:  Deferred AXIS III:   Past Medical History  Diagnosis Date  . Hypertension   . Pancreatitis   . Alcohol abuse   . Chronic abdominal pain   . Alcoholism /alcohol abuse   . Neuromuscular disorder 07/02/2012    Diabetic neuropathy  .  Hypercholesteremia   . Type II diabetes mellitus   . Depression   . Diabetic peripheral neuropathy   . Grand mal     "maybe once/month; had one for a whole week just prior to coming here; black out so many times" (12/04/2012)  . Headache(784.0)     "weekly; can get severe; never dx'd w/migraines" (12/04/2012)  . Arthritis     "hands & arms" (12/04/2012)  . Anxiety    AXIS IV:  economic problems AXIS V:  61-70 mild symptoms  Plan Of Care/Follow-up recommendations:  Activity:  reaume usual activity Diet:  resume usual diet  Is patient on multiple antipsychotic therapies at discharge:  No   Has Patient had three or more failed trials of antipsychotic monotherapy by history:  No  Recommended Plan for Multiple Antipsychotic Therapies: NA    TAYLOR,GERALD D 05/01/2013, 1:33 PM

## 2013-05-01 NOTE — Consult Note (Signed)
  Psychiatric Specialty Exam: Physical Exam  ROS  Blood pressure 117/82, pulse 96, temperature 97.8 F (36.6 C), temperature source Oral, resp. rate 16, SpO2 100.00%.There is no weight on file to calculate BMI.  General Appearance: Well Groomed  Patent attorneyye Contact::  Good  Speech:  Clear and Coherent  Volume:  Normal  Mood:  Euthymic  Affect:  Appropriate  Thought Process:  Logical  Orientation:  Full (Time, Place, and Person)  Thought Content:  Negative  Suicidal Thoughts:  No  Homicidal Thoughts:  No  Memory:  Immediate;   Good Recent;   Good Remote;   Good  Judgement:  Good  Insight:  Good  Psychomotor Activity:  Normal  Concentration:  Good  Recall:  Good  Akathisia:  Negative  Handed:  Right  AIMS (if indicated):     Assets:  Communication Skills Desire for Improvement Housing  Sleep:   adequate   Gabriel Rush looks much better today, blood sugar is under control.  He does not seem to need detox at this point but does want to go to a rehab.  He will be given followup numbers to call for rehab.after discharge home today. He denies any suicidal thoughts.

## 2013-05-01 NOTE — Progress Notes (Signed)
[facility] CL spoke with patient about [employer] program and [facility]. Patient stated to CL that he is homeless. Spoke with patient about [facility] and they different resources that offer. Provided pt with [employer] application, highlighting [facility], list of primary care resources, highlighting [facility], list of resources [facility] offers, and shelter and hot meal resources.

## 2013-05-02 LAB — URINE CULTURE

## 2013-05-11 ENCOUNTER — Encounter (HOSPITAL_COMMUNITY): Payer: Self-pay | Admitting: Emergency Medicine

## 2013-05-11 ENCOUNTER — Emergency Department (HOSPITAL_COMMUNITY)
Admission: EM | Admit: 2013-05-11 | Discharge: 2013-05-11 | Disposition: A | Discharge status: Home / Self Care | Payer: Self-pay | Attending: Emergency Medicine | Admitting: Emergency Medicine

## 2013-05-11 ENCOUNTER — Emergency Department (HOSPITAL_COMMUNITY): Payer: Self-pay

## 2013-05-11 DIAGNOSIS — R739 Hyperglycemia, unspecified: Secondary | ICD-10-CM

## 2013-05-11 DIAGNOSIS — Z8659 Personal history of other mental and behavioral disorders: Secondary | ICD-10-CM | POA: Insufficient documentation

## 2013-05-11 DIAGNOSIS — R110 Nausea: Secondary | ICD-10-CM | POA: Insufficient documentation

## 2013-05-11 DIAGNOSIS — Z8719 Personal history of other diseases of the digestive system: Secondary | ICD-10-CM | POA: Insufficient documentation

## 2013-05-11 DIAGNOSIS — G8929 Other chronic pain: Secondary | ICD-10-CM | POA: Insufficient documentation

## 2013-05-11 DIAGNOSIS — R197 Diarrhea, unspecified: Secondary | ICD-10-CM | POA: Insufficient documentation

## 2013-05-11 DIAGNOSIS — F1021 Alcohol dependence, in remission: Secondary | ICD-10-CM | POA: Insufficient documentation

## 2013-05-11 DIAGNOSIS — R61 Generalized hyperhidrosis: Secondary | ICD-10-CM | POA: Insufficient documentation

## 2013-05-11 DIAGNOSIS — Z8739 Personal history of other diseases of the musculoskeletal system and connective tissue: Secondary | ICD-10-CM | POA: Insufficient documentation

## 2013-05-11 DIAGNOSIS — E1142 Type 2 diabetes mellitus with diabetic polyneuropathy: Secondary | ICD-10-CM | POA: Insufficient documentation

## 2013-05-11 DIAGNOSIS — Z87891 Personal history of nicotine dependence: Secondary | ICD-10-CM | POA: Insufficient documentation

## 2013-05-11 DIAGNOSIS — I10 Essential (primary) hypertension: Secondary | ICD-10-CM | POA: Insufficient documentation

## 2013-05-11 DIAGNOSIS — E1149 Type 2 diabetes mellitus with other diabetic neurological complication: Secondary | ICD-10-CM | POA: Insufficient documentation

## 2013-05-11 DIAGNOSIS — R1084 Generalized abdominal pain: Secondary | ICD-10-CM | POA: Insufficient documentation

## 2013-05-11 LAB — URINALYSIS, ROUTINE W REFLEX MICROSCOPIC
BILIRUBIN URINE: NEGATIVE
Hgb urine dipstick: NEGATIVE
Ketones, ur: NEGATIVE mg/dL
Leukocytes, UA: NEGATIVE
NITRITE: NEGATIVE
PH: 6 (ref 5.0–8.0)
Protein, ur: NEGATIVE mg/dL
SPECIFIC GRAVITY, URINE: 1.036 — AB (ref 1.005–1.030)
Urobilinogen, UA: 0.2 mg/dL (ref 0.0–1.0)

## 2013-05-11 LAB — I-STAT CHEM 8, ED
BUN: 6 mg/dL (ref 6–23)
Calcium, Ion: 1.21 mmol/L (ref 1.12–1.23)
Chloride: 97 mEq/L (ref 96–112)
Creatinine, Ser: 1 mg/dL (ref 0.50–1.35)
Glucose, Bld: 639 mg/dL (ref 70–99)
HCT: 33 % — ABNORMAL LOW (ref 39.0–52.0)
Hemoglobin: 11.2 g/dL — ABNORMAL LOW (ref 13.0–17.0)
POTASSIUM: 3.2 meq/L — AB (ref 3.7–5.3)
Sodium: 132 mEq/L — ABNORMAL LOW (ref 137–147)
TCO2: 19 mmol/L (ref 0–100)

## 2013-05-11 LAB — BASIC METABOLIC PANEL
BUN: 7 mg/dL (ref 6–23)
CALCIUM: 8.2 mg/dL — AB (ref 8.4–10.5)
CO2: 20 meq/L (ref 19–32)
CREATININE: 0.86 mg/dL (ref 0.50–1.35)
Chloride: 103 mEq/L (ref 96–112)
GFR calc Af Amer: >90 mL/min (ref >90)
GFR calc non Af Amer: >90 mL/min (ref >90)
Glucose, Bld: 192 mg/dL — ABNORMAL HIGH (ref 70–99)
Potassium: 3 mEq/L — ABNORMAL LOW (ref 3.7–5.3)
Sodium: 139 mEq/L (ref 137–147)

## 2013-05-11 LAB — I-STAT ARTERIAL BLOOD GAS, ED
ACID-BASE DEFICIT: 4 mmol/L — AB (ref 0.0–2.0)
Bicarbonate: 21.3 mEq/L (ref 20.0–24.0)
O2 Saturation: 97 %
PCO2 ART: 40 mmHg (ref 35.0–45.0)
Patient temperature: 98.6
TCO2: 22 mmol/L (ref 0–100)
pH, Arterial: 7.333 — ABNORMAL LOW (ref 7.350–7.450)
pO2, Arterial: 99 mmHg (ref 80.0–100.0)

## 2013-05-11 LAB — COMPREHENSIVE METABOLIC PANEL
ALBUMIN: 3.3 g/dL — AB (ref 3.5–5.2)
ALT: 16 U/L (ref 0–53)
AST: 14 U/L (ref 0–37)
Alkaline Phosphatase: 93 U/L (ref 39–117)
BUN: 8 mg/dL (ref 6–23)
CALCIUM: 8.5 mg/dL (ref 8.4–10.5)
CO2: 17 mEq/L — ABNORMAL LOW (ref 19–32)
Chloride: 95 mEq/L — ABNORMAL LOW (ref 96–112)
Creatinine, Ser: 0.96 mg/dL (ref 0.50–1.35)
GFR calc Af Amer: >90 mL/min (ref >90)
GFR calc non Af Amer: >90 mL/min (ref >90)
Glucose, Bld: 615 mg/dL (ref 70–99)
Potassium: 3.3 mEq/L — ABNORMAL LOW (ref 3.7–5.3)
SODIUM: 132 meq/L — AB (ref 137–147)
Total Bilirubin: 0.3 mg/dL (ref 0.3–1.2)
Total Protein: 6.9 g/dL (ref 6.0–8.3)

## 2013-05-11 LAB — CBC
HCT: 29.9 % — ABNORMAL LOW (ref 39.0–52.0)
Hemoglobin: 10.7 g/dL — ABNORMAL LOW (ref 13.0–17.0)
MCH: 28.8 pg (ref 26.0–34.0)
MCHC: 35.8 g/dL (ref 30.0–36.0)
MCV: 80.4 fL (ref 78.0–100.0)
PLATELETS: 197 10*3/uL (ref 150–400)
RBC: 3.72 MIL/uL — AB (ref 4.22–5.81)
RDW: 13.4 % (ref 11.5–15.5)
WBC: 3.5 10*3/uL — ABNORMAL LOW (ref 4.0–10.5)

## 2013-05-11 LAB — CBG MONITORING, ED
GLUCOSE-CAPILLARY: 385 mg/dL — AB (ref 70–99)
Glucose-Capillary: 210 mg/dL — ABNORMAL HIGH (ref 70–99)

## 2013-05-11 LAB — URINE MICROSCOPIC-ADD ON

## 2013-05-11 LAB — LIPASE, BLOOD: Lipase: 11 U/L (ref 11–59)

## 2013-05-11 MED ORDER — SODIUM CHLORIDE 0.9 % IV BOLUS (SEPSIS)
1000.0000 mL | Freq: Once | INTRAVENOUS | Status: AC
Start: 2013-05-11 — End: 2013-05-11
  Administered 2013-05-11: 1000 mL via INTRAVENOUS

## 2013-05-11 MED ORDER — INSULIN ASPART PROT & ASPART (70-30 MIX) 100 UNIT/ML ~~LOC~~ SUSP: 20.0000 [IU] | Freq: Two times a day (BID) | SUBCUTANEOUS | Status: AC

## 2013-05-11 MED ORDER — INSULIN ASPART 100 UNIT/ML ~~LOC~~ SOLN
10.0000 [IU] | Freq: Once | SUBCUTANEOUS | Status: AC
  Administered 2013-05-11: 10 [IU] via INTRAVENOUS
  Filled 2013-05-11: qty 1

## 2013-05-11 MED ORDER — INSULIN REGULAR HUMAN 100 UNIT/ML IJ SOLN
INTRAMUSCULAR | Status: DC
  Administered 2013-05-11: 3.3 [IU]/h via INTRAVENOUS
  Filled 2013-05-11: qty 1

## 2013-05-11 MED ORDER — SODIUM CHLORIDE 0.9 % IV BOLUS (SEPSIS)
1000.0000 mL | Freq: Once | INTRAVENOUS | Status: AC
  Administered 2013-05-11: 1000 mL via INTRAVENOUS

## 2013-05-11 NOTE — Discharge Instructions (Signed)
Please continue your insulin and diabetes medicines as instructed. Followup with a primary care provider for continued evaluation and treatment. Return anytime for changing or worsening symptoms.    Hyperglycemia Hyperglycemia occurs when the glucose (sugar) in your blood is too high. Hyperglycemia can happen for many reasons, but it most often happens to people who do not know they have diabetes or are not managing their diabetes properly.  CAUSES  Whether you have diabetes or not, there are other causes of hyperglycemia. Hyperglycemia can occur when you have diabetes, but it can also occur in other situations that you might not be as aware of, such as: Diabetes  If you have diabetes and are having problems controlling your blood glucose, hyperglycemia could occur because of some of the following reasons:  Not following your meal plan.  Not taking your diabetes medications or not taking it properly.  Exercising less or doing less activity than you normally do.  Being sick. Pre-diabetes  This cannot be ignored. Before people develop Type 2 diabetes, they almost always have "pre-diabetes." This is when your blood glucose levels are higher than normal, but not yet high enough to be diagnosed as diabetes. Research has shown that some long-term damage to the body, especially the heart and circulatory system, may already be occurring during pre-diabetes. If you take action to manage your blood glucose when you have pre-diabetes, you may delay or prevent Type 2 diabetes from developing. Stress  If you have diabetes, you may be "diet" controlled or on oral medications or insulin to control your diabetes. However, you may find that your blood glucose is higher than usual in the hospital whether you have diabetes or not. This is often referred to as "stress hyperglycemia." Stress can elevate your blood glucose. This happens because of hormones put out by the body during times of stress. If stress has  been the cause of your high blood glucose, it can be followed regularly by your caregiver. That way he/she can make sure your hyperglycemia does not continue to get worse or progress to diabetes. Steroids  Steroids are medications that act on the infection fighting system (immune system) to block inflammation or infection. One side effect can be a rise in blood glucose. Most people can produce enough extra insulin to allow for this rise, but for those who cannot, steroids make blood glucose levels go even higher. It is not unusual for steroid treatments to "uncover" diabetes that is developing. It is not always possible to determine if the hyperglycemia will go away after the steroids are stopped. A special blood test called an A1c is sometimes done to determine if your blood glucose was elevated before the steroids were started. SYMPTOMS  Thirsty.  Frequent urination.  Dry mouth.  Blurred vision.  Tired or fatigue.  Weakness.  Sleepy.  Tingling in feet or leg. DIAGNOSIS  Diagnosis is made by monitoring blood glucose in one or all of the following ways:  A1c test. This is a chemical found in your blood.  Fingerstick blood glucose monitoring.  Laboratory results. TREATMENT  First, knowing the cause of the hyperglycemia is important before the hyperglycemia can be treated. Treatment may include, but is not be limited to:  Education.  Change or adjustment in medications.  Change or adjustment in meal plan.  Treatment for an illness, infection, etc.  More frequent blood glucose monitoring.  Change in exercise plan.  Decreasing or stopping steroids.  Lifestyle changes. HOME CARE INSTRUCTIONS   Test your  blood glucose as directed.  Exercise regularly. Your caregiver will give you instructions about exercise. Pre-diabetes or diabetes which comes on with stress is helped by exercising.  Eat wholesome, balanced meals. Eat often and at regular, fixed times. Your caregiver  or nutritionist will give you a meal plan to guide your sugar intake.  Being at an ideal weight is important. If needed, losing as little as 10 to 15 pounds may help improve blood glucose levels. SEEK MEDICAL CARE IF:   You have questions about medicine, activity, or diet.  You continue to have symptoms (problems such as increased thirst, urination, or weight gain). SEEK IMMEDIATE MEDICAL CARE IF:   You are vomiting or have diarrhea.  Your breath smells fruity.  You are breathing faster or slower.  You are very sleepy or incoherent.  You have numbness, tingling, or pain in your feet or hands.  You have chest pain.  Your symptoms get worse even though you have been following your caregiver's orders.  If you have any other questions or concerns. Document Released: 07/20/2000 Document Revised: 04/18/2011 Document Reviewed: 05/23/2011 [facility] Patient Information 2014 [employer], [place].

## 2013-05-11 NOTE — ED Provider Notes (Signed)
Medical screening examination/treatment/procedure(s) were performed by non-physician practitioner and as supervising physician I was immediately available for consultation/collaboration.   EKG Interpretation None       Gabriel NielsenBrian Tenasia Aull, MD 05/11/13 [id number]

## 2013-05-11 NOTE — ED Provider Notes (Signed)
CSN: [id number]     Arrival date & time 05/11/13  0031 History   First MD Initiated Contact with Patient 05/11/13 0101     Chief Complaint  Patient presents with  . Hyperglycemia   HPI  History provided by the patient. Patient is a 53 year old male with history of alcohol abuse, hypertension, chronic abdominal pain with pancreatitis, diabetes who presents with complaints of general fatigue, weakness and diarrhea symptoms. Patient states he has been out of all of his medications including his insulin for the past 4 days. Around noon yesterday he began feeling more lethargic with increased weakness. He also reports 6 episodes of soft brown diarrhea stool without blood. This has been associated with some nausea but no episodes of vomiting. He also reports some sweating episodes. Denies fever, chills. Denies any recent travel. Denies any known sick contacts. No other aggravating or alleviating factors. No other associated symptoms.    Past Medical History  Diagnosis Date  . Hypertension   . Pancreatitis   . Alcohol abuse   . Chronic abdominal pain   . Alcoholism /alcohol abuse   . Neuromuscular disorder 07/02/2012    Diabetic neuropathy  . Hypercholesteremia   . Type II diabetes mellitus   . Depression   . Diabetic peripheral neuropathy   . Grand mal     "maybe once/month; had one for a whole week just prior to coming here; black out so many times" (12/04/2012)  . Headache(784.0)     "weekly; can get severe; never dx'd w/migraines" (12/04/2012)  . Arthritis     "hands & arms" (12/04/2012)  . Anxiety    Past Surgical History  Procedure Laterality Date  . Appendectomy  1970's   Family History  Problem Relation Age of Onset  . Hypertension    . Hypertension Mother    History  Substance Use Topics  . Smoking status: Former Smoker -- 0.50 packs/day for 8 years    Types: Cigarettes    Quit date: 12/21/1984  . Smokeless tobacco: Never Used  . Alcohol Use: 3.6 oz/week    6 Cans of  beer per week     Comment: heavy    Review of Systems  Constitutional: Positive for diaphoresis. Negative for fever and chills.  HENT: Negative for congestion.   Respiratory: Negative for cough.   Gastrointestinal: Positive for nausea, abdominal pain and diarrhea. Negative for vomiting.  All other systems reviewed and are negative.      Allergies  Morphine and related  Home Medications  No current outpatient prescriptions on file. BP 119/85  Pulse 87  Temp(Src) 98.3 F (36.8 C) (Oral)  Resp 13  Ht 5\' 11"  (1.803 m)  Wt 160 lb (72.576 kg)  BMI 22.33 kg/m2  SpO2 100% Physical Exam  Nursing note and vitals reviewed. Constitutional: He is oriented to person, place, and time. He appears well-developed and well-nourished. No distress.  HENT:  Head: Normocephalic.  Mouth/Throat: Oropharynx is clear and moist.  Eyes: Conjunctivae are normal.  Cardiovascular: Normal rate and regular rhythm.   Pulmonary/Chest: Effort normal and breath sounds normal. No respiratory distress. He has no wheezes. He has no rales.  Abdominal: Soft. He exhibits no distension. There is tenderness. There is guarding. There is no rebound.  Mild to moderate diffuse abdominal tenderness with occasional guarding.  Musculoskeletal: Normal range of motion.  Neurological: He is alert and oriented to person, place, and time.  Skin: Skin is warm.  Psychiatric: He has a normal mood and affect. His  behavior is normal.    ED Course  Procedures   COORDINATION OF CARE:  Nursing notes reviewed. Vital signs reviewed. Initial pt interview and examination performed.   Filed Vitals:   05/11/13 0300 05/11/13 0315 05/11/13 0330 05/11/13 0345  BP: 125/93 126/92 118/87 119/85  Pulse: 91 89 87 87  Temp:      TempSrc:      Resp:      Height:      Weight:      SpO2: 100% 100% 100% 100%    1:30 AM-patient seen and evaluated. Normal respirations O2 sats. Normal mentation. Blood sugar significantly elevated. Anion  gap of 20. Concerns for DKA. IV fluids and insulin ordered.  Patient having good response of blood sugar with fluids and insulin. Continues to appear well with normal mentation. Additional lab test show only a very slight acidosis. CO2 on ABG appears much better. UA pending.  UA does not show any signs of ketones. Patient continues to have good improvement of blood sugars. Will repeat BMP.  Anion gap improved. Patient reports feeling significantly better. At this time he is ready to return home. No other concerning findings on his laboratory tests. Do not feel the patient has any signs of DKA. Will give prescriptions for his insulin. Patient states he will be able to get this later in the day. Strict return precautions given.    Treatment plan initiated: Medications  insulin regular (NOVOLIN R,HUMULIN R) 1 Units/mL in sodium chloride 0.9 % 100 mL infusion (0 Units/hr Intravenous Stopped 05/11/13 0459)  sodium chloride 0.9 % bolus 1,000 mL (0 mLs Intravenous Stopped 05/11/13 0234)  sodium chloride 0.9 % bolus 1,000 mL (0 mLs Intravenous Stopped 05/11/13 0234)  insulin aspart (novoLOG) injection 10 Units (10 Units Intravenous Given 05/11/13 0157)   Results for orders placed during the hospital encounter of 05/11/13  CBC      Result Value Ref Range   WBC 3.5 (*) 4.0 - 10.5 K/uL   RBC 3.72 (*) 4.22 - 5.81 MIL/uL   Hemoglobin 10.7 (*) 13.0 - 17.0 g/dL   HCT 16.1 (*) 09.6 - 04.5 %   MCV 80.4  78.0 - 100.0 fL   MCH 28.8  26.0 - 34.0 pg   MCHC 35.8  30.0 - 36.0 g/dL   RDW 40.9  81.1 - 91.4 %   Platelets 197  150 - 400 K/uL  COMPREHENSIVE METABOLIC PANEL      Result Value Ref Range   Sodium 132 (*) 137 - 147 mEq/L   Potassium 3.3 (*) 3.7 - 5.3 mEq/L   Chloride 95 (*) 96 - 112 mEq/L   CO2 17 (*) 19 - 32 mEq/L   Glucose, Bld 615 (*) 70 - 99 mg/dL   BUN 8  6 - 23 mg/dL   Creatinine, Ser 7.82  0.50 - 1.35 mg/dL   Calcium 8.5  8.4 - 95.6 mg/dL   Total Protein 6.9  6.0 - 8.3 g/dL   Albumin 3.3 (*)  3.5 - 5.2 g/dL   AST 14  0 - 37 U/L   ALT 16  0 - 53 U/L   Alkaline Phosphatase 93  39 - 117 U/L   Total Bilirubin 0.3  0.3 - 1.2 mg/dL   GFR calc non Af Amer >90  >90 mL/min   GFR calc Af Amer >90  >90 mL/min  URINALYSIS, ROUTINE W REFLEX MICROSCOPIC      Result Value Ref Range   Color, Urine YELLOW  YELLOW  APPearance CLEAR  CLEAR   Specific Gravity, Urine 1.036 (*) 1.005 - 1.030   pH 6.0  5.0 - 8.0   Glucose, UA >1000 (*) NEGATIVE mg/dL   Hgb urine dipstick NEGATIVE  NEGATIVE   Bilirubin Urine NEGATIVE  NEGATIVE   Ketones, ur NEGATIVE  NEGATIVE mg/dL   Protein, ur NEGATIVE  NEGATIVE mg/dL   Urobilinogen, UA 0.2  0.0 - 1.0 mg/dL   Nitrite NEGATIVE  NEGATIVE   Leukocytes, UA NEGATIVE  NEGATIVE  LIPASE, BLOOD      Result Value Ref Range   Lipase 11  11 - 59 U/L  URINE MICROSCOPIC-ADD ON      Result Value Ref Range   Squamous Epithelial / LPF RARE  RARE   Bacteria, UA RARE  RARE  BASIC METABOLIC PANEL      Result Value Ref Range   Sodium 139  137 - 147 mEq/L   Potassium 3.0 (*) 3.7 - 5.3 mEq/L   Chloride 103  96 - 112 mEq/L   CO2 20  19 - 32 mEq/L   Glucose, Bld 192 (*) 70 - 99 mg/dL   BUN 7  6 - 23 mg/dL   Creatinine, Ser 1.610.86  0.50 - 1.35 mg/dL   Calcium 8.2 (*) 8.4 - 10.5 mg/dL   GFR calc non Af Amer >90  >90 mL/min   GFR calc Af Amer >90  >90 mL/min  I-STAT ARTERIAL BLOOD GAS, ED      Result Value Ref Range   pH, Arterial 7.333 (*) 7.350 - 7.450   pCO2 arterial 40.0  35.0 - 45.0 mmHg   pO2, Arterial 99.0  80.0 - 100.0 mmHg   Bicarbonate 21.3  20.0 - 24.0 mEq/L   TCO2 22  0 - 100 mmol/L   O2 Saturation 97.0     Acid-base deficit 4.0 (*) 0.0 - 2.0 mmol/L   Patient temperature 98.6 F     Collection site RADIAL, ALLEN'S TEST ACCEPTABLE     Drawn by Operator     Sample type ARTERIAL    I-STAT CHEM 8, ED      Result Value Ref Range   Sodium 132 (*) 137 - 147 mEq/L   Potassium 3.2 (*) 3.7 - 5.3 mEq/L   Chloride 97  96 - 112 mEq/L   BUN 6  6 - 23 mg/dL    Creatinine, Ser 0.961.00  0.50 - 1.35 mg/dL   Glucose, Bld 045639 (*) 70 - 99 mg/dL   Calcium, Ion 4.091.21  8.111.12 - 1.23 mmol/L   TCO2 19  0 - 100 mmol/L   Hemoglobin 11.2 (*) 13.0 - 17.0 g/dL   HCT 91.433.0 (*) 78.239.0 - 95.652.0 %   Comment NOTIFIED PHYSICIAN    CBG MONITORING, ED      Result Value Ref Range   Glucose-Capillary 385 (*) 70 - 99 mg/dL   Comment 1 Documented in Chart     Comment 2 Notify RN    CBG MONITORING, ED      Result Value Ref Range   Glucose-Capillary 210 (*) 70 - 99 mg/dL   Comment 1 Documented in Chart     Comment 2 Notify RN          Imaging Review Dg Chest 2 View  05/11/2013   CLINICAL DATA:  Hyperglycemia.  EXAM: CHEST  2 VIEW  COMPARISON:  Chest radiograph from 11/22/2011  FINDINGS: The lungs are well-aerated and clear. There is no evidence of focal opacification, pleural effusion or pneumothorax.  The heart is normal in size; the mediastinal contour is within normal limits. No acute osseous abnormalities are seen.  IMPRESSION: No acute cardiopulmonary process seen.   Electronically Signed   By: Roanna Raider M.D.   On: 05/11/2013 01:38     EKG Interpretation None      MDM   Final diagnoses:  Hyperglycemia without ketosis        Angus Seller, PA-C 05/11/13 [phone]

## 2013-05-11 NOTE — ED Notes (Signed)
Patient presents to Ed via [facility]. Patient called EMS c/o of "not feeling good" and being out of insulin x4 days. EMS reports patients CBG=553. No other complaints voiced by patient at this time. Upon arrival to ED patient is A&Ox4. No acute distress noted at this time.

## 2013-05-11 NOTE — ED Notes (Signed)
Dr Opitz given a copy of chem 8 results  

## 2013-05-18 ENCOUNTER — Encounter (HOSPITAL_COMMUNITY): Payer: Self-pay | Admitting: Emergency Medicine

## 2013-05-18 ENCOUNTER — Emergency Department (HOSPITAL_COMMUNITY)
Admission: EM | Admit: 2013-05-18 | Discharge: 2013-05-18 | Disposition: A | Discharge status: Home / Self Care | Payer: Self-pay | Attending: Emergency Medicine | Admitting: Emergency Medicine

## 2013-05-18 DIAGNOSIS — E1149 Type 2 diabetes mellitus with other diabetic neurological complication: Secondary | ICD-10-CM | POA: Insufficient documentation

## 2013-05-18 DIAGNOSIS — Z5900 Homelessness unspecified: Secondary | ICD-10-CM | POA: Insufficient documentation

## 2013-05-18 DIAGNOSIS — Z8739 Personal history of other diseases of the musculoskeletal system and connective tissue: Secondary | ICD-10-CM | POA: Insufficient documentation

## 2013-05-18 DIAGNOSIS — F3289 Other specified depressive episodes: Secondary | ICD-10-CM | POA: Insufficient documentation

## 2013-05-18 DIAGNOSIS — F411 Generalized anxiety disorder: Secondary | ICD-10-CM | POA: Insufficient documentation

## 2013-05-18 DIAGNOSIS — Z8719 Personal history of other diseases of the digestive system: Secondary | ICD-10-CM | POA: Insufficient documentation

## 2013-05-18 DIAGNOSIS — F329 Major depressive disorder, single episode, unspecified: Secondary | ICD-10-CM | POA: Insufficient documentation

## 2013-05-18 DIAGNOSIS — F1021 Alcohol dependence, in remission: Secondary | ICD-10-CM | POA: Insufficient documentation

## 2013-05-18 DIAGNOSIS — G8929 Other chronic pain: Secondary | ICD-10-CM | POA: Insufficient documentation

## 2013-05-18 DIAGNOSIS — R110 Nausea: Secondary | ICD-10-CM | POA: Insufficient documentation

## 2013-05-18 DIAGNOSIS — E1142 Type 2 diabetes mellitus with diabetic polyneuropathy: Secondary | ICD-10-CM | POA: Insufficient documentation

## 2013-05-18 DIAGNOSIS — Z87891 Personal history of nicotine dependence: Secondary | ICD-10-CM | POA: Insufficient documentation

## 2013-05-18 DIAGNOSIS — R739 Hyperglycemia, unspecified: Secondary | ICD-10-CM

## 2013-05-18 DIAGNOSIS — I10 Essential (primary) hypertension: Secondary | ICD-10-CM | POA: Insufficient documentation

## 2013-05-18 DIAGNOSIS — R1084 Generalized abdominal pain: Secondary | ICD-10-CM | POA: Insufficient documentation

## 2013-05-18 LAB — CBC WITH DIFFERENTIAL/PLATELET
Basophils Absolute: 0 10*3/uL (ref 0.0–0.1)
Basophils Relative: 0 % (ref 0–1)
EOS PCT: 0 % (ref 0–5)
Eosinophils Absolute: 0 10*3/uL (ref 0.0–0.7)
HCT: 28.9 % — ABNORMAL LOW (ref 39.0–52.0)
HEMOGLOBIN: 10.3 g/dL — AB (ref 13.0–17.0)
LYMPHS ABS: 0.8 10*3/uL (ref 0.7–4.0)
LYMPHS PCT: 29 % (ref 12–46)
MCH: 29.2 pg (ref 26.0–34.0)
MCHC: 35.6 g/dL (ref 30.0–36.0)
MCV: 81.9 fL (ref 78.0–100.0)
MONOS PCT: 5 % (ref 3–12)
Monocytes Absolute: 0.2 10*3/uL (ref 0.1–1.0)
Neutro Abs: 1.8 10*3/uL (ref 1.7–7.7)
Neutrophils Relative %: 66 % (ref 43–77)
PLATELETS: 124 10*3/uL — AB (ref 150–400)
RBC: 3.53 MIL/uL — AB (ref 4.22–5.81)
RDW: 13.9 % (ref 11.5–15.5)
WBC: 2.8 10*3/uL — AB (ref 4.0–10.5)

## 2013-05-18 LAB — COMPREHENSIVE METABOLIC PANEL
ALT: 17 U/L (ref 0–53)
AST: 17 U/L (ref 0–37)
Albumin: 3 g/dL — ABNORMAL LOW (ref 3.5–5.2)
Alkaline Phosphatase: 82 U/L (ref 39–117)
BUN: 8 mg/dL (ref 6–23)
CALCIUM: 8.3 mg/dL — AB (ref 8.4–10.5)
CO2: 21 meq/L (ref 19–32)
Chloride: 94 mEq/L — ABNORMAL LOW (ref 96–112)
Creatinine, Ser: 0.76 mg/dL (ref 0.50–1.35)
GLUCOSE: 674 mg/dL — AB (ref 70–99)
Potassium: 3.6 mEq/L — ABNORMAL LOW (ref 3.7–5.3)
SODIUM: 132 meq/L — AB (ref 137–147)
Total Bilirubin: 0.3 mg/dL (ref 0.3–1.2)
Total Protein: 6.2 g/dL (ref 6.0–8.3)

## 2013-05-18 LAB — I-STAT ARTERIAL BLOOD GAS, ED
ACID-BASE DEFICIT: 2 mmol/L (ref 0.0–2.0)
Bicarbonate: 23.3 mEq/L (ref 20.0–24.0)
O2 Saturation: 88 %
PCO2 ART: 42.7 mmHg (ref 35.0–45.0)
Patient temperature: 98.6
TCO2: 25 mmol/L (ref 0–100)
pH, Arterial: 7.345 — ABNORMAL LOW (ref 7.350–7.450)
pO2, Arterial: 57 mmHg — ABNORMAL LOW (ref 80.0–100.0)

## 2013-05-18 LAB — CBG MONITORING, ED
Glucose-Capillary: >600 mg/dL (ref 70–99)
Glucose-Capillary: 516 mg/dL — ABNORMAL HIGH (ref 70–99)
Glucose-Capillary: 279 mg/dL — ABNORMAL HIGH (ref 70–99)

## 2013-05-18 LAB — KETONES, URINE: Ketones, ur: NEGATIVE mg/dL

## 2013-05-18 MED ORDER — INSULIN ASPART 100 UNIT/ML ~~LOC~~ SOLN
10.0000 [IU] | Freq: Once | SUBCUTANEOUS | Status: AC
  Administered 2013-05-18: 10 [IU] via SUBCUTANEOUS
  Filled 2013-05-18: qty 1

## 2013-05-18 MED ORDER — SODIUM CHLORIDE 0.9 % IV SOLN: INTRAVENOUS | Status: DC

## 2013-05-18 MED ORDER — SODIUM CHLORIDE 0.9 % IV BOLUS (SEPSIS)
1000.0000 mL | Freq: Once | INTRAVENOUS | Status: AC
  Administered 2013-05-18: 1000 mL via INTRAVENOUS

## 2013-05-18 MED ORDER — SODIUM CHLORIDE 0.9 % IV SOLN
INTRAVENOUS | Status: DC
  Administered 2013-05-18: 4.6 [IU]/h via INTRAVENOUS
  Filled 2013-05-18: qty 1

## 2013-05-18 MED ORDER — DEXTROSE-NACL 5-0.45 % IV SOLN: INTRAVENOUS | Status: DC

## 2013-05-18 NOTE — ED Provider Notes (Signed)
CSN: [id number]     Arrival date & time 05/18/13  1502 History   First MD Initiated Contact with Patient 05/18/13 1506     Chief Complaint  Patient presents with  . Hyperglycemia     (Consider location/radiation/quality/duration/timing/severity/associated sxs/prior Treatment) HPI  Patient to the ER with complaints of CBG to 598. PMH of homelessness, hypertension, pancreatitis, alcohol abuse, diabetes, anxiety.  Reportedly, he was being arrested by [employer] at a local store, it was at this time he disclosed his glucose to be elevated. When they checked it it was in fact > 500, therefore he was brought to the ER. He reports feeling a bit weak and having some mild abdominal pain. He requests some ice chips. He says that he did at his last visit tell the EDP be able to get the medications but reports that the money for it "fell through", he says that $30 for medication is a lot when you don't have any medication.  Past Medical History  Diagnosis Date  . Hypertension   . Pancreatitis   . Alcohol abuse   . Chronic abdominal pain   . Alcoholism /alcohol abuse   . Neuromuscular disorder 07/02/2012    Diabetic neuropathy  . Hypercholesteremia   . Type II diabetes mellitus   . Depression   . Diabetic peripheral neuropathy   . Grand mal     "maybe once/month; had one for a whole week just prior to coming here; black out so many times" (12/04/2012)  . Headache(784.0)     "weekly; can get severe; never dx'd w/migraines" (12/04/2012)  . Arthritis     "hands & arms" (12/04/2012)  . Anxiety    Past Surgical History  Procedure Laterality Date  . Appendectomy  1970's   Family History  Problem Relation Age of Onset  . Hypertension    . Hypertension Mother    History  Substance Use Topics  . Smoking status: Former Smoker -- 0.50 packs/day for 8 years    Types: Cigarettes    Quit date: 12/21/1984  . Smokeless tobacco: Never Used  . Alcohol Use: 3.6 oz/week    6 Cans of beer per week   Comment: heavy    Review of Systems   Review of Systems  Gen: no weight loss, fevers, chills, night sweats  Eyes: no discharge or drainage, no occular pain or visual changes  Nose: no epistaxis or rhinorrhea  Mouth: no dental pain, no sore throat  Neck: no neck pain  Lungs:No wheezing, coughing or hemoptysis CV: no chest pain, palpitations, dependent edema or orthopnea  Abd: + abdominal pain, nausea, no vomiting, diarrhea GU: no dysuria or gross hematuria  MSK:  No muscle weakness or pain Neuro: no headache, no focal neurologic deficits  Skin: no rash or wounds Psyche: no complaints    Allergies  Morphine and related  Home Medications   Current Outpatient Rx  Name  Route  Sig  Dispense  Refill  . FLUoxetine (PROZAC) 40 MG capsule   Oral   Take 80 mg by mouth 2 (two) times daily.         . hydrOXYzine (VISTARIL) 100 MG capsule   Oral   Take 100 mg by mouth 2 (two) times daily as needed for itching.         . insulin aspart protamine- aspart (NOVOLOG MIX 70/30) (70-30) 100 UNIT/ML injection   Subcutaneous   Inject 0.2 mLs (20 Units total) into the skin 2 (two) times daily with a meal.  10 mL   11   . loperamide (IMODIUM) 2 MG capsule   Oral   Take 10 mg by mouth as needed for diarrhea or loose stools.          BP 118/85  Pulse 74  Resp 16  SpO2 99% Physical Exam  Nursing note and vitals reviewed. Constitutional: He appears well-developed and well-nourished. No distress.  Mucous membranes are dry  HENT:  Head: Normocephalic and atraumatic.  Eyes: Pupils are equal, round, and reactive to light.  Neck: Normal range of motion. Neck supple.  Cardiovascular: Normal rate and regular rhythm.   Pulmonary/Chest: Effort normal.  Abdominal: Soft. Bowel sounds are normal. There is tenderness (very mild and diffuse).  Neurological: He is alert.  Skin: Skin is warm and dry.  Psychiatric: His mood appears not anxious. He is not agitated and not aggressive. He  expresses no homicidal and no suicidal ideation.    ED Course  Procedures (including critical care time) Labs Review Labs Reviewed  CBC WITH DIFFERENTIAL - Abnormal; Notable for the following:    WBC 2.8 (*)    RBC 3.53 (*)    Hemoglobin 10.3 (*)    HCT 28.9 (*)    Platelets 124 (*)    All other components within normal limits  COMPREHENSIVE METABOLIC PANEL - Abnormal; Notable for the following:    Sodium 132 (*)    Potassium 3.6 (*)    Chloride 94 (*)    Glucose, Bld 674 (*)    Calcium 8.3 (*)    Albumin 3.0 (*)    All other components within normal limits  CBG MONITORING, ED - Abnormal; Notable for the following:    Glucose-Capillary >600 (*)    All other components within normal limits  CBG MONITORING, ED - Abnormal; Notable for the following:    Glucose-Capillary 516 (*)    All other components within normal limits  I-STAT ARTERIAL BLOOD GAS, ED - Abnormal; Notable for the following:    pH, Arterial 7.345 (*)    pO2, Arterial 57.0 (*)    All other components within normal limits  CBG MONITORING, ED - Abnormal; Notable for the following:    Glucose-Capillary 279 (*)    All other components within normal limits  KETONES, URINE   Imaging Review No results found.   EKG Interpretation None      MDM   Final diagnoses:  Hyperglycemia    Patient is not acidotic and does not have a gap. His glucose is greater than 500 at this time. Will monitor glucose and bring it down to > 300. Social work and case management are working with patient (see note from SW) regarding being able to obtain his medications.  At this time the patient is stable and when his glucose has improved should be able to be discharged with CM and SW support.  Patients glucose is now 279. He has been given the tools to be able to take his medications.-- Case management informs me that he has already used his one time Match and all of the other resources they have on other visits. Unable to help him  get his medications, per case work and case management. Pt reports he may be able to get the money this time. Advised to follow-up with resources given to him by SW and CM.  53 y.o.Gabriel Rush's evaluation in the Emergency Department is complete. It has been determined that no acute conditions requiring further emergency intervention are present at this time.  The patient/guardian have been advised of the diagnosis and plan. We have discussed signs and symptoms that warrant return to the ED, such as changes or worsening in symptoms.  Vital signs are stable at discharge. Filed Vitals:   05/18/13 1845  BP: 118/85  Pulse: 74  Resp: 16    Patient/guardian has voiced understanding and agreed to follow-up with the PCP or specialist.     Dorthula Matasiffany G Fay Bagg, PA-C 05/18/13 1921

## 2013-05-18 NOTE — ED Notes (Signed)
Spoke with CM; if he is admitted they can work with him more. If he is not, he has exhausted his resources already this year. They will provide with information and brochures for shelters.

## 2013-05-18 NOTE — Discharge Instructions (Signed)
Hyperglycemia °Hyperglycemia occurs when the glucose (sugar) in your blood is too high. Hyperglycemia can happen for many reasons, but it most often happens to people who do not know they have diabetes or are not managing their diabetes properly.  °CAUSES  °Whether you have diabetes or not, there are other causes of hyperglycemia. Hyperglycemia can occur when you have diabetes, but it can also occur in other situations that you might not be as aware of, such as: °Diabetes °· If you have diabetes and are having problems controlling your blood glucose, hyperglycemia could occur because of some of the following reasons: °· Not following your meal plan. °· Not taking your diabetes medications or not taking it properly. °· Exercising less or doing less activity than you normally do. °· Being sick. °Pre-diabetes °· This cannot be ignored. Before people develop Type 2 diabetes, they almost always have "pre-diabetes." This is when your blood glucose levels are higher than normal, but not yet high enough to be diagnosed as diabetes. Research has shown that some long-term damage to the body, especially the heart and circulatory system, may already be occurring during pre-diabetes. If you take action to manage your blood glucose when you have pre-diabetes, you may delay or prevent Type 2 diabetes from developing. °Stress °· If you have diabetes, you may be "diet" controlled or on oral medications or insulin to control your diabetes. However, you may find that your blood glucose is higher than usual in the hospital whether you have diabetes or not. This is often referred to as "stress hyperglycemia." Stress can elevate your blood glucose. This happens because of hormones put out by the body during times of stress. If stress has been the cause of your high blood glucose, it can be followed regularly by your caregiver. That way he/she can make sure your hyperglycemia does not continue to get worse or progress to  diabetes. °Steroids °· Steroids are medications that act on the infection fighting system (immune system) to block inflammation or infection. One side effect can be a rise in blood glucose. Most people can produce enough extra insulin to allow for this rise, but for those who cannot, steroids make blood glucose levels go even higher. It is not unusual for steroid treatments to "uncover" diabetes that is developing. It is not always possible to determine if the hyperglycemia will go away after the steroids are stopped. A special blood test called an A1c is sometimes done to determine if your blood glucose was elevated before the steroids were started. °SYMPTOMS °· Thirsty. °· Frequent urination. °· Dry mouth. °· Blurred vision. °· Tired or fatigue. °· Weakness. °· Sleepy. °· Tingling in feet or leg. °DIAGNOSIS  °Diagnosis is made by monitoring blood glucose in one or all of the following ways: °· A1c test. This is a chemical found in your blood. °· Fingerstick blood glucose monitoring. °· Laboratory results. °TREATMENT  °First, knowing the cause of the hyperglycemia is important before the hyperglycemia can be treated. Treatment may include, but is not be limited to: °· Education. °· Change or adjustment in medications. °· Change or adjustment in meal plan. °· Treatment for an illness, infection, etc. °· More frequent blood glucose monitoring. °· Change in exercise plan. °· Decreasing or stopping steroids. °· Lifestyle changes. °HOME CARE INSTRUCTIONS  °· Test your blood glucose as directed. °· Exercise regularly. Your caregiver will give you instructions about exercise. Pre-diabetes or diabetes which comes on with stress is helped by exercising. °· Eat wholesome,   balanced meals. Eat often and at regular, fixed times. Your caregiver or nutritionist will give you a meal plan to guide your sugar intake. °· Being at an ideal weight is important. If needed, losing as little as 10 to 15 pounds may help improve blood  glucose levels. °SEEK MEDICAL CARE IF:  °· You have questions about medicine, activity, or diet. °· You continue to have symptoms (problems such as increased thirst, urination, or weight gain). °SEEK IMMEDIATE MEDICAL CARE IF:  °· You are vomiting or have diarrhea. °· Your breath smells fruity. °· You are breathing faster or slower. °· You are very sleepy or incoherent. °· You have numbness, tingling, or pain in your feet or hands. °· You have chest pain. °· Your symptoms get worse even though you have been following your caregiver's orders. °· If you have any other questions or concerns. °Document Released: 07/20/2000 Document Revised: 04/18/2011 Document Reviewed: 05/23/2011 °ExitCare® Patient Information ©2014 [employer]. ° °Glucose, Blood Sugar, Fasting Blood Sugar °This is a test to measure your blood sugar. Glucose is a simple sugar that serves as the main source of energy for the body. The carbohydrates we eat are broken down into glucose (and a few other simple sugars), absorbed by the small intestine, and circulated throughout the body. Most of the body's cells require glucose for energy production; brain and nervous system cells not only rely on glucose for energy, they can only function when glucose levels in the blood remain above a certain level.  °The body's use of glucose hinges on the availability of insulin, a hormone produced by the pancreas. Insulin acts as a traffic director, transporting glucose into the body's cells, directing the body to store excess glucose as glycogen (for short-term storage) and/or as triglycerides in fat cells. We can not live without glucose or insulin, and they must be in balance.  °Normally, blood glucose levels rise slightly after a meal, and insulin is secreted to lower them, with the amount of insulin released matched up with the size and content of the meal. If blood glucose levels drop too low, such as might occur in between meals or after a strenuous workout,  glucagon (another pancreatic hormone) is secreted to tell the liver to turn some glycogen back into glucose, raising the blood glucose levels. If the glucose/insulin feedback mechanism is working properly, the amount of glucose in the blood remains fairly stable. If the balance is disrupted and glucose levels in the blood rise, then the body tries to restore the balance, both by increasing insulin production and by excreting glucose in the urine.  °PREPARATION FOR TEST °A blood sample drawn from a vein in your arm or, for a self check, a drop of blood from a skin prick; in general, it may be recommended that you fast before having a blood glucose test; sometimes a random (no preparation) urine sample is used. Your caregiver will instruct you as to what they want prior to your testing. °NORMAL FINDINGS °Normal values depend on many factors. Your lab will provide a range of normal values with your test results. The following information summarizes the meaning of the test results. These are based on the clinical practice recommendations of the [employer] Association.  °FASTING BLOOD GLUCOSE °· From 70 to 99 mg/dL (3.9 to 5.5 mmol/L): Normal glucose tolerance °· From 100 to 125 mg/dL (5.6 to 6.9 mmol/L):Impaired fasting glucose (pre-diabetes) °· 126 mg/dL (7.0 mmol/L) and above on more than one testing occasion: Diabetes °ORAL   GLUCOSE TOLERANCE TEST (OGTT) [EXCEPT PREGNANCY] (2 HOURS AFTER A 75-GRAM GLUCOSE DRINK) °· Less than 140 mg/dL (7.8 mmol/L): Normal glucose tolerance °· From 140 to 200 mg/dL (7.8 to 11.1 mmol/L): Impaired glucose tolerance (pre-diabetes) °· Over 200 mg/dL (11.1 mmol/L) on more than one testing occasion: Diabetes °GESTATIONAL DIABETES SCREENING: GLUCOSE CHALLENGE TEST (1 HOUR AFTER A 50-GRAM GLUCOSE DRINK) °· Less than 140* mg/dL (7.8 mmol/L): Normal glucose tolerance °· 140* mg/dL (7.8 mmol/L) and over: Abnormal, needs OGTT (see below) °* Some use a cutoff of More Than 130 mg/dL (7.2  mmol/L) because that identifies 90% of women with gestational diabetes, compared to 80% identified using the threshold of More Than 140 mg/dL (7.8 mmol/L). °GESTATIONAL DIABETES DIAGNOSTIC: OGTT (100-GRAM GLUCOSE DRINK) °· Fasting*..........................................95 mg/dL (5.3 mmol/L) °· 1 hour after glucose load*..............180 mg/dL (10.0 mmol/L) °· 2 hours after glucose load*.............155 mg/dL (8.6 mmol/L) °· 3 hours after glucose load* **.........140 mg/dL (7.8 mmol/L) °* If two or more values are above the criteria, gestational diabetes is diagnosed. °** A 75-gram glucose load may be used, although this method is not as well validated as the 100-gram OGTT; the 3-hour sample is not drawn if 75 grams is used.  °Ranges for normal findings may vary among different laboratories and hospitals. You should always check with your doctor after having lab work or other tests done to discuss the meaning of your test results and whether your values are considered within normal limits. °MEANING OF TEST  °Your caregiver will go over the test results with you and discuss the importance and meaning of your results, as well as treatment options and the need for additional tests if necessary. °OBTAINING THE TEST RESULTS °It is your responsibility to obtain your test results. Ask the lab or department performing the test when and how you will get your results. °Document Released: 02/26/2004 Document Revised: 04/18/2011 Document Reviewed: 01/05/2008 °ExitCare® Patient Information ©2014 [employer]. ° °

## 2013-05-18 NOTE — Progress Notes (Signed)
Weekend CSW met with patient to provide support and resources such as shelter list, 211 information, and information on [place] services. Patient verbalized his understanding, thanked CSW for assistance. Patient mentioned that he did not have any income and that he had difficulty affording his insulin. CSW contacted CM [phone] to Joaquin, MSW, [facility]. [phone]

## 2013-05-18 NOTE — ED Notes (Signed)
Per EMS, pt with CBG 598.  Pt picked up from [facility].  Pt is homeless.  IV established en route; NS bolus running wide open.  Pt CAOx4.

## 2013-05-18 NOTE — ED Notes (Signed)
[place] Blood Glucose 649.

## 2013-05-19 NOTE — ED Provider Notes (Signed)
Medical screening examination/treatment/procedure(s) were performed by non-physician practitioner and as supervising physician I was immediately available for consultation/collaboration.     Rasean Joos, MD 05/19/13 1057 

## 2013-09-21 IMAGING — CT CT ABD-PELV W/ CM
2 of 5 series · 17 of 46 positions shown, 19 images · IV contrast (omnipaque)
Comparison: 04/13/2012.

CLINICAL DATA: Abdominal pain.  History of pancreatitis.

CT ABDOMEN AND PELVIS WITH CONTRAST
TECHNIQUE: Multidetector CT imaging of the abdomen and pelvis was
performed following the standard protocol during bolus
administration of intravenous contrast.
Contrast: 80mL OMNIPAQUE IOHEXOL 300 MG/ML  SOLN

[Series 2: abd/pelv with 5.0 b31f st · axial · 0.70mm/px · z∈[-490,-95]mm · 14 of 89 slices shown, 16 images]
[im 5/89  soft-tissue]
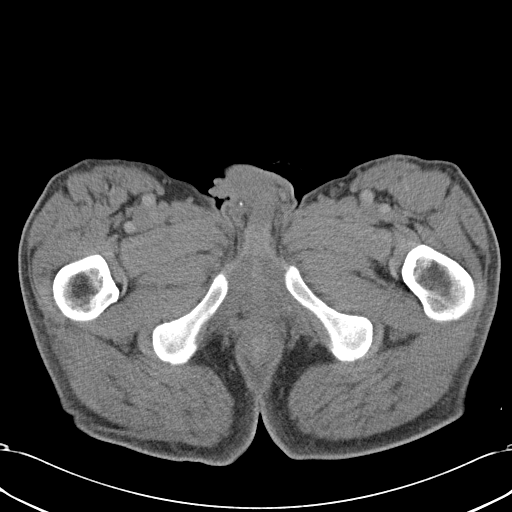
[im 5/89  bone]
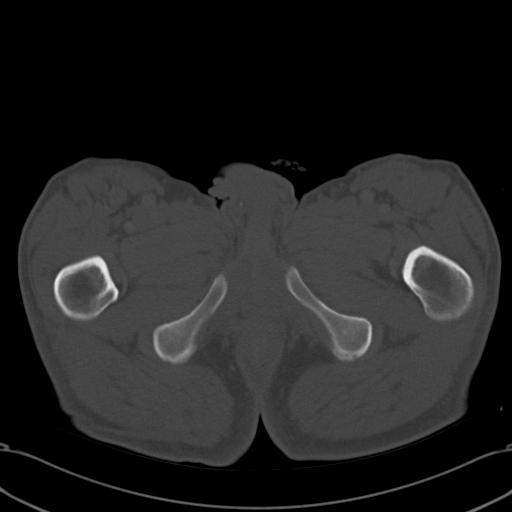
[im 10/89  soft-tissue]
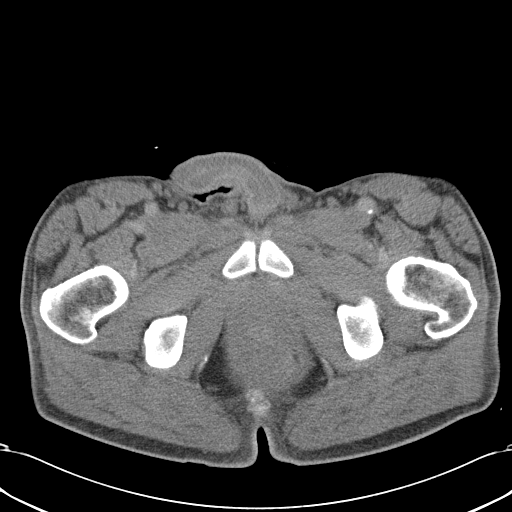
[im 19/89  soft-tissue]
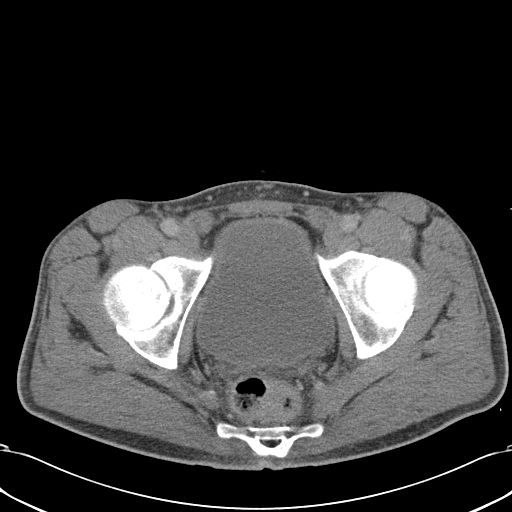
[im 24/89  soft-tissue]
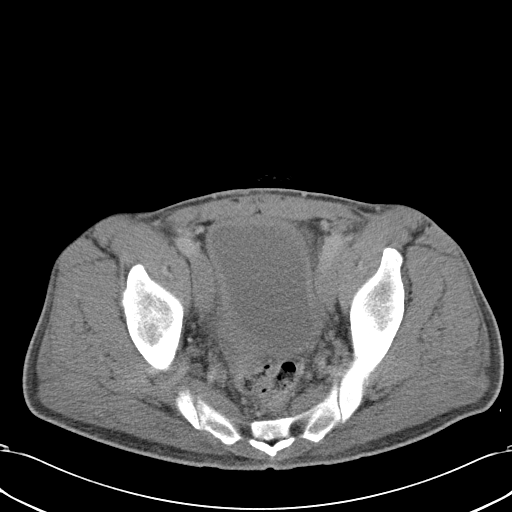
[im 28/89  soft-tissue]
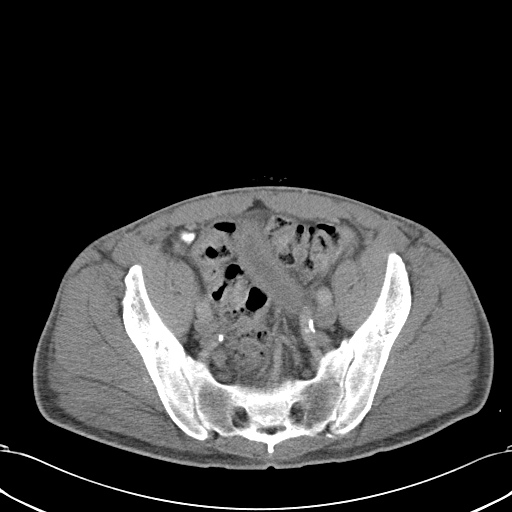
[im 38/89  soft-tissue]
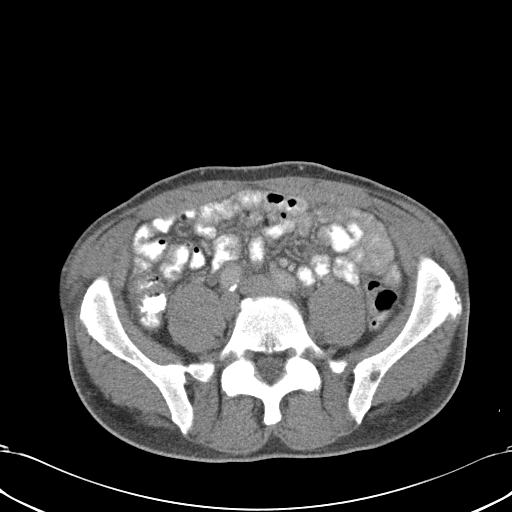
[im 42/89  soft-tissue]
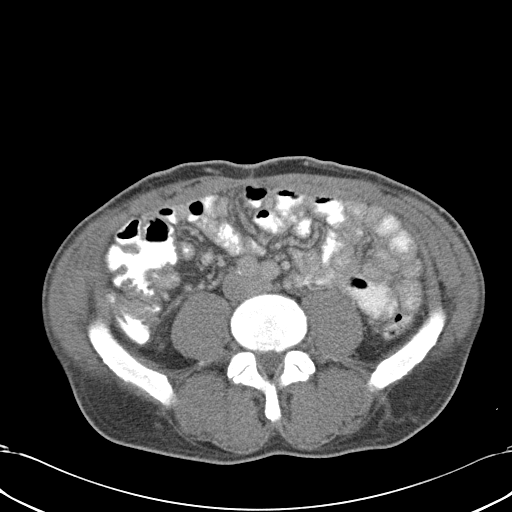
[im 47/89  soft-tissue]
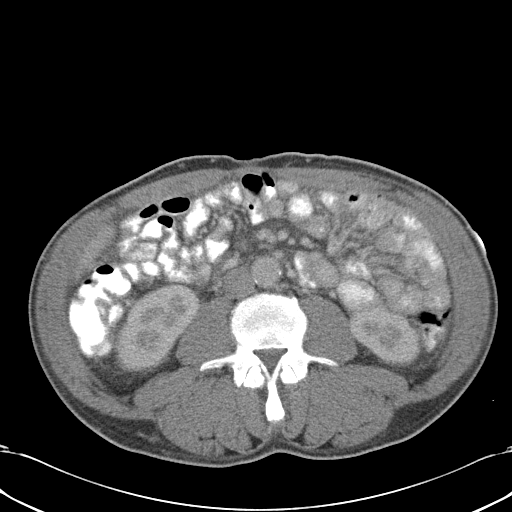
[im 51/89  soft-tissue]
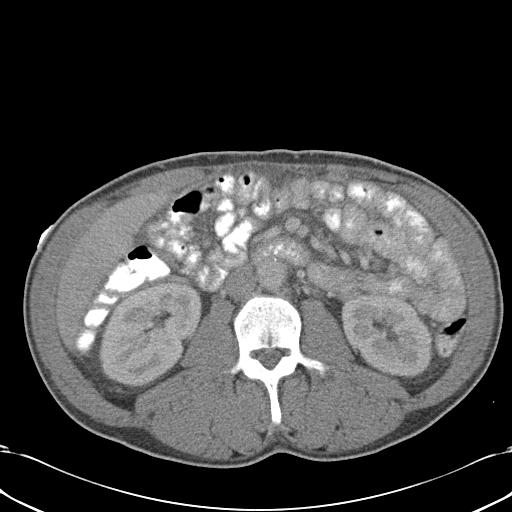
[im 51/89  bone]
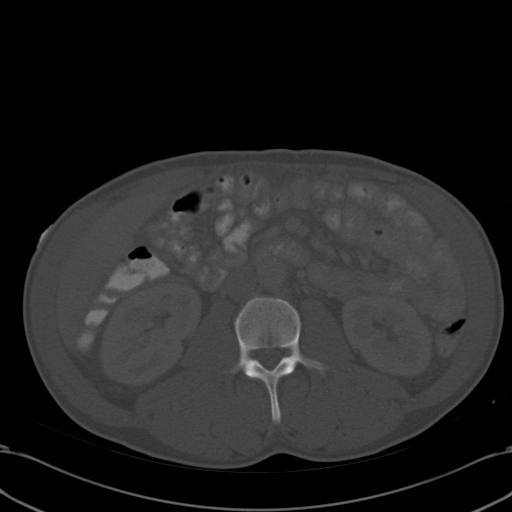
[im 61/89  soft-tissue]
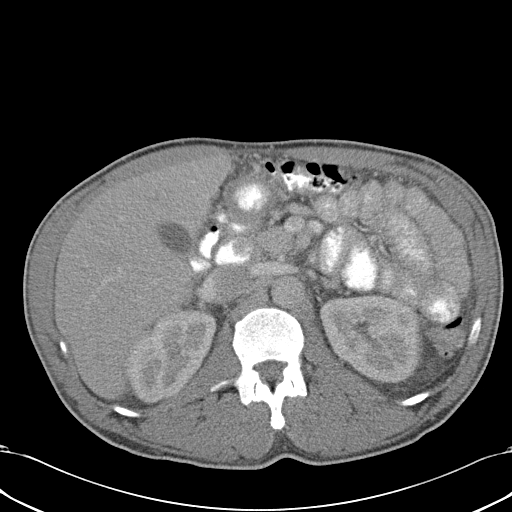
[im 65/89  soft-tissue]
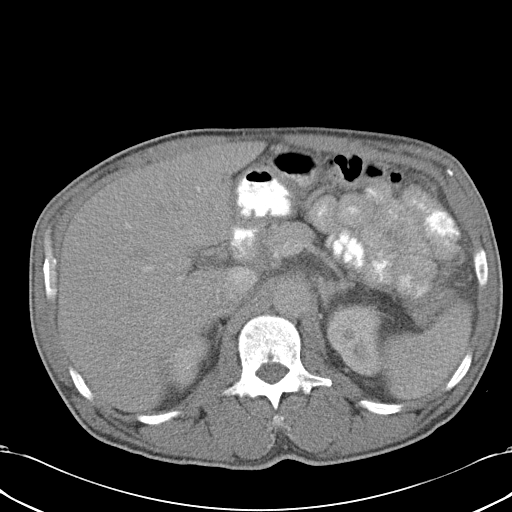
[im 70/89  soft-tissue]
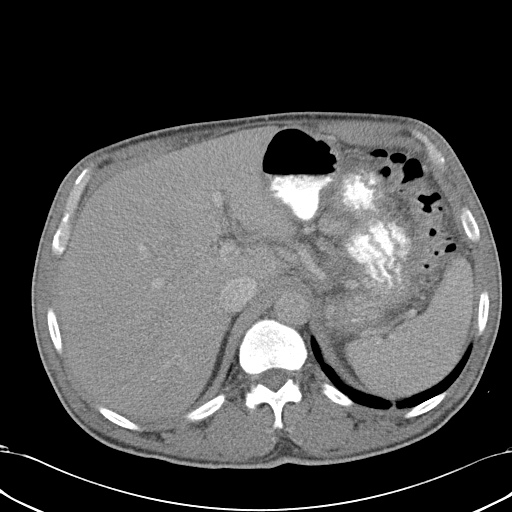
[im 79/89  soft-tissue]
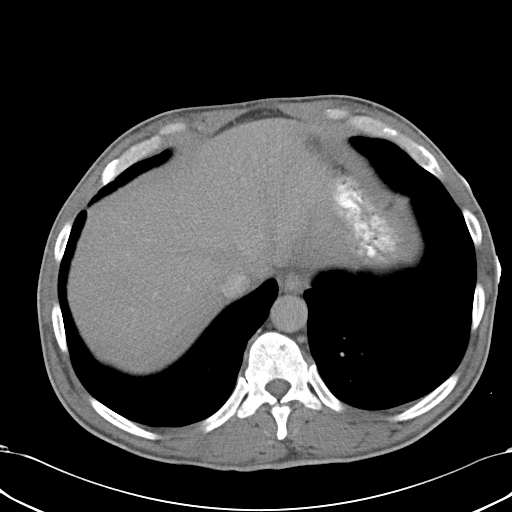
[im 84/89  soft-tissue]
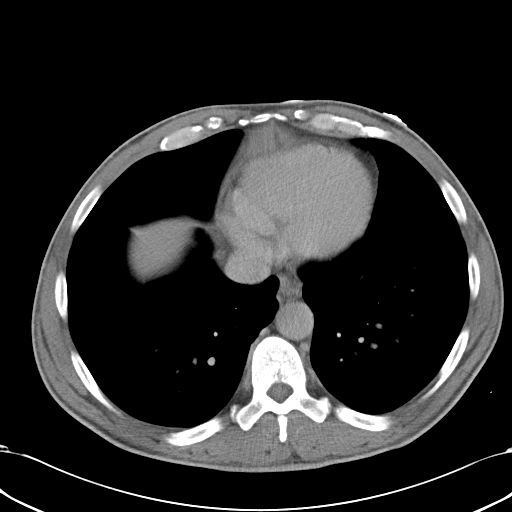

[Series 5: coronals · coronal · 0.83mm/px · 3 of 124 slices shown]
[im 42/124  soft-tissue]
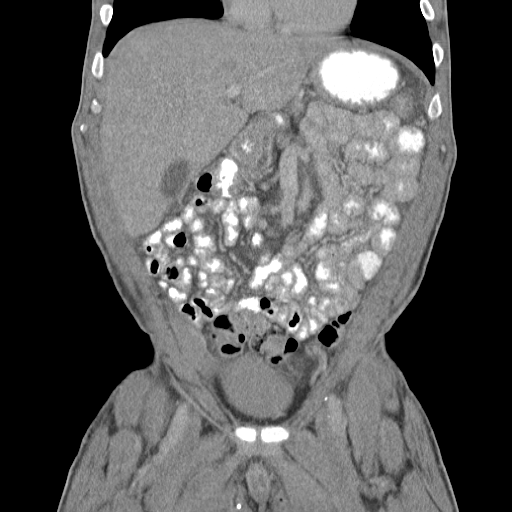
[im 55/124  soft-tissue]
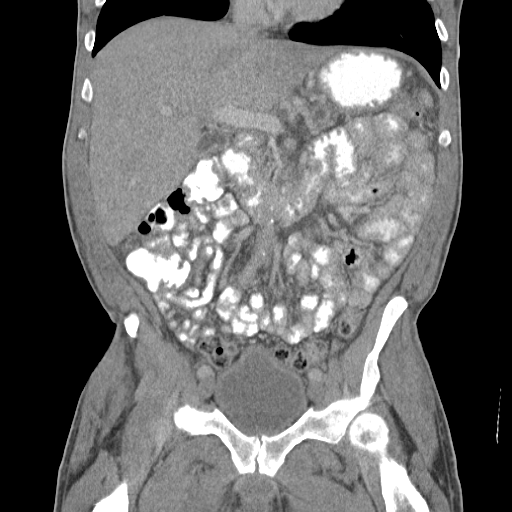
[im 69/124  soft-tissue]
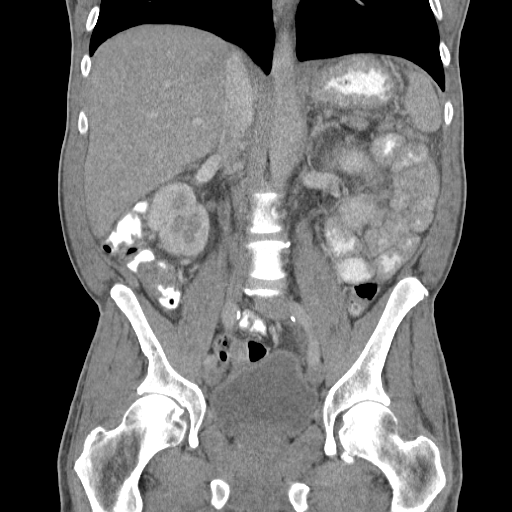

[17 of 46 positions shown; findings below may reference images not displayed]

FINDINGS: The lung bases are clear.  No pleural effusion.

The liver is unremarkable and stable.  No focal lesions or
intrahepatic biliary dilatation.  The gallbladder is normal.  No
common bile duct dilatation.  The pancreas demonstrates a stable
small pseudocyst measuring a maximum of 2 cm in the body.  No
findings for acute pancreatitis.  The spleen is normal in size.  No
focal lesions.  The adrenal glands and kidneys are normal and
stable.

The stomach, duodenum, small bowel and colon are unremarkable.  No
inflammatory changes, mass lesions or obstruction.  No mesenteric
or retroperitoneal masses or adenopathy.  Small scattered lymph
nodes appears stable.  The aorta is normal in caliber.  Mild
atherosclerotic changes.

The bladder appears normal.  The prostate gland is mildly enlarged
but unchanged.  The seminal vesicles are normal.  No pelvic mass,
adenopathy or free pelvic fluid collections.  No inguinal mass or
hernia.

Since this CT scan of 10/23/2011 the patient has lost a significant
amount of weight.  I do not see any significant intraperitoneal fat
which was fairly prominent on the prior examination.

No significant bony findings.
IMPRESSION: 1.  Stable small pseudocyst in the pancreatic body.
2.  No findings for acute pancreatitis.
3.  No acute abdominal/pelvic findings, mass lesions or adenopathy.
4.  Significant weight loss since 10/23/2011.

## 2018-09-08 DEATH — deceased
